# Patient Record
Sex: Male | Born: 1950
Health system: Southern US, Community
[De-identification: ages and names within clinical notes are randomized; demographics above are authoritative.]

## PROBLEM LIST (undated history)

## (undated) DIAGNOSIS — N400 Enlarged prostate without lower urinary tract symptoms: Secondary | ICD-10-CM

## (undated) DIAGNOSIS — M1990 Unspecified osteoarthritis, unspecified site: Secondary | ICD-10-CM

## (undated) DIAGNOSIS — Z9889 Other specified postprocedural states: Secondary | ICD-10-CM

## (undated) DIAGNOSIS — C9000 Multiple myeloma not having achieved remission: Secondary | ICD-10-CM

## (undated) DIAGNOSIS — I10 Essential (primary) hypertension: Secondary | ICD-10-CM

## (undated) DIAGNOSIS — M279 Disease of jaws, unspecified: Secondary | ICD-10-CM

## (undated) DIAGNOSIS — I509 Heart failure, unspecified: Secondary | ICD-10-CM

## (undated) DIAGNOSIS — R251 Tremor, unspecified: Secondary | ICD-10-CM

## (undated) DIAGNOSIS — E785 Hyperlipidemia, unspecified: Secondary | ICD-10-CM

## (undated) DIAGNOSIS — A809 Acute poliomyelitis, unspecified: Secondary | ICD-10-CM

## (undated) DIAGNOSIS — IMO0002 Reserved for concepts with insufficient information to code with codable children: Secondary | ICD-10-CM

## (undated) DIAGNOSIS — D759 Disease of blood and blood-forming organs, unspecified: Secondary | ICD-10-CM

## (undated) DIAGNOSIS — G709 Myoneural disorder, unspecified: Secondary | ICD-10-CM

## (undated) DIAGNOSIS — K5790 Diverticulosis of intestine, part unspecified, without perforation or abscess without bleeding: Secondary | ICD-10-CM

## (undated) HISTORY — DX: Benign prostatic hyperplasia without lower urinary tract symptoms: N40.0

## (undated) HISTORY — DX: Other specified postprocedural states: Z98.890

## (undated) HISTORY — DX: Hemochromatosis, unspecified: E83.119

## (undated) HISTORY — DX: Reserved for concepts with insufficient information to code with codable children: IMO0002

## (undated) HISTORY — PX: FOOT SURGERY: SHX648

## (undated) HISTORY — DX: Diverticulosis of intestine, part unspecified, without perforation or abscess without bleeding: K57.90

## (undated) HISTORY — DX: Hyperlipidemia, unspecified: E78.5

## (undated) HISTORY — PX: HIATAL HERNIA REPAIR: SHX195

## (undated) HISTORY — DX: Essential (primary) hypertension: I10

## (undated) HISTORY — PX: EYE SURGERY: SHX253

## (undated) HISTORY — PX: OTHER SURGICAL HISTORY: SHX169

## (undated) HISTORY — DX: Acute poliomyelitis, unspecified: A80.9

---
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
## 2001-08-23 ENCOUNTER — Encounter: Admission: RE | Admit: 2001-08-23 | Discharge: 2001-08-23 | Payer: Self-pay | Admitting: Family Medicine

## 2001-08-23 ENCOUNTER — Encounter: Payer: Self-pay | Admitting: Family Medicine

## 2001-11-11 ENCOUNTER — Encounter (INDEPENDENT_AMBULATORY_CARE_PROVIDER_SITE_OTHER): Payer: Self-pay | Admitting: Specialist

## 2001-11-11 ENCOUNTER — Ambulatory Visit (HOSPITAL_COMMUNITY): Admission: RE | Admit: 2001-11-11 | Discharge: 2001-11-11 | Payer: Self-pay | Admitting: Gastroenterology

## 2002-01-04 ENCOUNTER — Encounter: Payer: Self-pay | Admitting: Surgery

## 2002-01-11 ENCOUNTER — Inpatient Hospital Stay (HOSPITAL_COMMUNITY): Admission: RE | Admit: 2002-01-11 | Discharge: 2002-01-14 | Payer: Self-pay | Admitting: Surgery

## 2002-01-11 ENCOUNTER — Encounter (INDEPENDENT_AMBULATORY_CARE_PROVIDER_SITE_OTHER): Payer: Self-pay | Admitting: Specialist

## 2002-01-12 ENCOUNTER — Encounter: Payer: Self-pay | Admitting: Surgery

## 2003-03-31 ENCOUNTER — Emergency Department (HOSPITAL_COMMUNITY): Admission: AD | Admit: 2003-03-31 | Discharge: 2003-03-31 | Payer: Self-pay | Admitting: Emergency Medicine

## 2004-12-01 ENCOUNTER — Ambulatory Visit (HOSPITAL_COMMUNITY): Admission: RE | Admit: 2004-12-01 | Discharge: 2004-12-01 | Payer: Self-pay | Admitting: Surgery

## 2004-12-01 ENCOUNTER — Ambulatory Visit (HOSPITAL_BASED_OUTPATIENT_CLINIC_OR_DEPARTMENT_OTHER): Admission: RE | Admit: 2004-12-01 | Discharge: 2004-12-01 | Payer: Self-pay | Admitting: Surgery

## 2004-12-01 ENCOUNTER — Encounter (INDEPENDENT_AMBULATORY_CARE_PROVIDER_SITE_OTHER): Payer: Self-pay | Admitting: *Deleted

## 2008-11-01 ENCOUNTER — Ambulatory Visit: Payer: Self-pay | Admitting: Hematology and Oncology

## 2008-11-28 LAB — CBC WITH DIFFERENTIAL/PLATELET
BASO%: 0.5 % (ref 0.0–2.0)
Basophils Absolute: 0 10*3/uL (ref 0.0–0.1)
EOS%: 2.6 % (ref 0.0–7.0)
Eosinophils Absolute: 0.2 10*3/uL (ref 0.0–0.5)
HCT: 45.8 % (ref 38.4–49.9)
HGB: 16.1 g/dL (ref 13.0–17.1)
LYMPH%: 25.8 % (ref 14.0–49.0)
MCH: 33.4 pg (ref 27.2–33.4)
MCHC: 35.1 g/dL (ref 32.0–36.0)
MCV: 95.2 fL (ref 79.3–98.0)
MONO#: 0.8 10*3/uL (ref 0.1–0.9)
MONO%: 11.1 % (ref 0.0–14.0)
NEUT#: 4.3 10*3/uL (ref 1.5–6.5)
NEUT%: 60 % (ref 39.0–75.0)
Platelets: 254 10*3/uL (ref 140–400)
RBC: 4.81 10*6/uL (ref 4.20–5.82)
RDW: 12.7 % (ref 11.0–14.6)
WBC: 7.2 10*3/uL (ref 4.0–10.3)
lymph#: 1.9 10*3/uL (ref 0.9–3.3)

## 2008-11-30 ENCOUNTER — Ambulatory Visit (HOSPITAL_COMMUNITY): Admission: RE | Admit: 2008-11-30 | Discharge: 2008-11-30 | Payer: Self-pay | Admitting: Hematology and Oncology

## 2008-12-04 LAB — COMPREHENSIVE METABOLIC PANEL
ALT: 35 U/L (ref 0–53)
AST: 25 U/L (ref 0–37)
Albumin: 4.4 g/dL (ref 3.5–5.2)
Alkaline Phosphatase: 66 U/L (ref 39–117)
BUN: 18 mg/dL (ref 6–23)
CO2: 22 mEq/L (ref 19–32)
Calcium: 9.1 mg/dL (ref 8.4–10.5)
Chloride: 106 mEq/L (ref 96–112)
Creatinine, Ser: 0.77 mg/dL (ref 0.40–1.50)
Glucose, Bld: 95 mg/dL (ref 70–99)
Potassium: 4.5 mEq/L (ref 3.5–5.3)
Sodium: 140 mEq/L (ref 135–145)
Total Bilirubin: 1.1 mg/dL (ref 0.3–1.2)
Total Protein: 7.3 g/dL (ref 6.0–8.3)

## 2008-12-04 LAB — HEMOCHROMATOSIS DNA-PCR(C282Y,H63D)

## 2008-12-04 LAB — IRON AND TIBC
%SAT: 81 % — ABNORMAL HIGH (ref 20–55)
Iron: 228 ug/dL — ABNORMAL HIGH (ref 42–165)
TIBC: 283 ug/dL (ref 215–435)
UIBC: 55 ug/dL

## 2008-12-04 LAB — FERRITIN: Ferritin: 459 ng/mL — ABNORMAL HIGH (ref 22–322)

## 2008-12-26 ENCOUNTER — Ambulatory Visit: Payer: Self-pay | Admitting: Hematology and Oncology

## 2009-01-03 LAB — CBC WITH DIFFERENTIAL/PLATELET
BASO%: 0.9 % (ref 0.0–2.0)
Basophils Absolute: 0 10*3/uL (ref 0.0–0.1)
EOS%: 3.5 % (ref 0.0–7.0)
Eosinophils Absolute: 0.2 10*3/uL (ref 0.0–0.5)
HCT: 40.2 % (ref 38.4–49.9)
HGB: 14.1 g/dL (ref 13.0–17.1)
LYMPH%: 32.8 % (ref 14.0–49.0)
MCH: 33.1 pg (ref 27.2–33.4)
MCHC: 35 g/dL (ref 32.0–36.0)
MCV: 94.5 fL (ref 79.3–98.0)
MONO#: 0.5 10*3/uL (ref 0.1–0.9)
MONO%: 10.5 % (ref 0.0–14.0)
NEUT#: 2.2 10*3/uL (ref 1.5–6.5)
NEUT%: 52.3 % (ref 39.0–75.0)
Platelets: 219 10*3/uL (ref 140–400)
RBC: 4.25 10*6/uL (ref 4.20–5.82)
RDW: 12.6 % (ref 11.0–14.6)
WBC: 4.3 10*3/uL (ref 4.0–10.3)
lymph#: 1.4 10*3/uL (ref 0.9–3.3)

## 2009-01-11 LAB — CBC WITH DIFFERENTIAL/PLATELET
BASO%: 1.1 % (ref 0.0–2.0)
Basophils Absolute: 0.1 10*3/uL (ref 0.0–0.1)
EOS%: 3.4 % (ref 0.0–7.0)
Eosinophils Absolute: 0.2 10*3/uL (ref 0.0–0.5)
HCT: 41.1 % (ref 38.4–49.9)
HGB: 14.2 g/dL (ref 13.0–17.1)
LYMPH%: 36 % (ref 14.0–49.0)
MCH: 32.9 pg (ref 27.2–33.4)
MCHC: 34.5 g/dL (ref 32.0–36.0)
MCV: 95.4 fL (ref 79.3–98.0)
MONO#: 0.5 10*3/uL (ref 0.1–0.9)
MONO%: 10.9 % (ref 0.0–14.0)
NEUT#: 2.1 10*3/uL (ref 1.5–6.5)
NEUT%: 48.6 % (ref 39.0–75.0)
Platelets: 223 10*3/uL (ref 140–400)
RBC: 4.31 10*6/uL (ref 4.20–5.82)
RDW: 13.5 % (ref 11.0–14.6)
WBC: 4.4 10*3/uL (ref 4.0–10.3)
lymph#: 1.6 10*3/uL (ref 0.9–3.3)

## 2009-01-18 LAB — CBC WITH DIFFERENTIAL/PLATELET
BASO%: 0.6 % (ref 0.0–2.0)
Basophils Absolute: 0 10*3/uL (ref 0.0–0.1)
EOS%: 3.9 % (ref 0.0–7.0)
Eosinophils Absolute: 0.1 10*3/uL (ref 0.0–0.5)
HCT: 37.2 % — ABNORMAL LOW (ref 38.4–49.9)
HGB: 12.9 g/dL — ABNORMAL LOW (ref 13.0–17.1)
LYMPH%: 37.7 % (ref 14.0–49.0)
MCH: 33.8 pg — ABNORMAL HIGH (ref 27.2–33.4)
MCHC: 34.7 g/dL (ref 32.0–36.0)
MCV: 97.3 fL (ref 79.3–98.0)
MONO#: 0.3 10*3/uL (ref 0.1–0.9)
MONO%: 9.2 % (ref 0.0–14.0)
NEUT#: 1.6 10*3/uL (ref 1.5–6.5)
NEUT%: 48.6 % (ref 39.0–75.0)
Platelets: 189 10*3/uL (ref 140–400)
RBC: 3.82 10*6/uL — ABNORMAL LOW (ref 4.20–5.82)
RDW: 13.7 % (ref 11.0–14.6)
WBC: 3.4 10*3/uL — ABNORMAL LOW (ref 4.0–10.3)
lymph#: 1.3 10*3/uL (ref 0.9–3.3)

## 2009-01-18 LAB — FERRITIN: Ferritin: 116 ng/mL (ref 22–322)

## 2009-02-01 LAB — CBC WITH DIFFERENTIAL/PLATELET
BASO%: 0.7 % (ref 0.0–2.0)
Basophils Absolute: 0 10*3/uL (ref 0.0–0.1)
EOS%: 3.7 % (ref 0.0–7.0)
Eosinophils Absolute: 0.1 10*3/uL (ref 0.0–0.5)
HCT: 40.7 % (ref 38.4–49.9)
HGB: 14.1 g/dL (ref 13.0–17.1)
LYMPH%: 33.5 % (ref 14.0–49.0)
MCH: 33.9 pg — ABNORMAL HIGH (ref 27.2–33.4)
MCHC: 34.5 g/dL (ref 32.0–36.0)
MCV: 98.1 fL — ABNORMAL HIGH (ref 79.3–98.0)
MONO#: 0.4 10*3/uL (ref 0.1–0.9)
MONO%: 10.5 % (ref 0.0–14.0)
NEUT#: 2 10*3/uL (ref 1.5–6.5)
NEUT%: 51.6 % (ref 39.0–75.0)
Platelets: 209 10*3/uL (ref 140–400)
RBC: 4.15 10*6/uL — ABNORMAL LOW (ref 4.20–5.82)
RDW: 13.8 % (ref 11.0–14.6)
WBC: 3.9 10*3/uL — ABNORMAL LOW (ref 4.0–10.3)
lymph#: 1.3 10*3/uL (ref 0.9–3.3)

## 2009-02-13 ENCOUNTER — Ambulatory Visit: Payer: Self-pay | Admitting: Hematology and Oncology

## 2009-02-15 LAB — CBC WITH DIFFERENTIAL/PLATELET
BASO%: 0.6 % (ref 0.0–2.0)
Basophils Absolute: 0 10*3/uL (ref 0.0–0.1)
EOS%: 3.8 % (ref 0.0–7.0)
Eosinophils Absolute: 0.1 10*3/uL (ref 0.0–0.5)
HCT: 41.8 % (ref 38.4–49.9)
HGB: 14.5 g/dL (ref 13.0–17.1)
LYMPH%: 33 % (ref 14.0–49.0)
MCH: 33.9 pg — ABNORMAL HIGH (ref 27.2–33.4)
MCHC: 34.8 g/dL (ref 32.0–36.0)
MCV: 97.4 fL (ref 79.3–98.0)
MONO#: 0.3 10*3/uL (ref 0.1–0.9)
MONO%: 8.7 % (ref 0.0–14.0)
NEUT#: 2 10*3/uL (ref 1.5–6.5)
NEUT%: 53.9 % (ref 39.0–75.0)
Platelets: 236 10*3/uL (ref 140–400)
RBC: 4.29 10*6/uL (ref 4.20–5.82)
RDW: 13.1 % (ref 11.0–14.6)
WBC: 3.8 10*3/uL — ABNORMAL LOW (ref 4.0–10.3)
lymph#: 1.2 10*3/uL (ref 0.9–3.3)

## 2009-03-05 LAB — COMPREHENSIVE METABOLIC PANEL
ALT: 38 U/L (ref 0–53)
AST: 35 U/L (ref 0–37)
Albumin: 3.8 g/dL (ref 3.5–5.2)
Alkaline Phosphatase: 70 U/L (ref 39–117)
BUN: 14 mg/dL (ref 6–23)
CO2: 25 mEq/L (ref 19–32)
Calcium: 8.8 mg/dL (ref 8.4–10.5)
Chloride: 107 mEq/L (ref 96–112)
Creatinine, Ser: 0.84 mg/dL (ref 0.40–1.50)
Glucose, Bld: 84 mg/dL (ref 70–99)
Potassium: 3.8 mEq/L (ref 3.5–5.3)
Sodium: 139 mEq/L (ref 135–145)
Total Bilirubin: 0.9 mg/dL (ref 0.3–1.2)
Total Protein: 6.9 g/dL (ref 6.0–8.3)

## 2009-03-05 LAB — CBC WITH DIFFERENTIAL/PLATELET
BASO%: 0.7 % (ref 0.0–2.0)
Basophils Absolute: 0 10*3/uL (ref 0.0–0.1)
EOS%: 3.7 % (ref 0.0–7.0)
Eosinophils Absolute: 0.2 10*3/uL (ref 0.0–0.5)
HCT: 40.9 % (ref 38.4–49.9)
HGB: 14.2 g/dL (ref 13.0–17.1)
LYMPH%: 27.8 % (ref 14.0–49.0)
MCH: 33.8 pg — ABNORMAL HIGH (ref 27.2–33.4)
MCHC: 34.7 g/dL (ref 32.0–36.0)
MCV: 97.6 fL (ref 79.3–98.0)
MONO#: 0.7 10*3/uL (ref 0.1–0.9)
MONO%: 12.4 % (ref 0.0–14.0)
NEUT#: 3.1 10*3/uL (ref 1.5–6.5)
NEUT%: 55.4 % (ref 39.0–75.0)
Platelets: 195 10*3/uL (ref 140–400)
RBC: 4.19 10*6/uL — ABNORMAL LOW (ref 4.20–5.82)
RDW: 12.8 % (ref 11.0–14.6)
WBC: 5.6 10*3/uL (ref 4.0–10.3)
lymph#: 1.6 10*3/uL (ref 0.9–3.3)

## 2009-03-05 LAB — FERRITIN: Ferritin: 41 ng/mL (ref 22–322)

## 2009-03-15 LAB — HM COLONOSCOPY

## 2009-05-01 ENCOUNTER — Ambulatory Visit: Payer: Self-pay | Admitting: Hematology and Oncology

## 2009-05-06 LAB — BASIC METABOLIC PANEL WITH GFR
BUN: 14 mg/dL (ref 6–23)
CO2: 27 meq/L (ref 19–32)
Calcium: 8.8 mg/dL (ref 8.4–10.5)
Chloride: 108 meq/L (ref 96–112)
Creatinine, Ser: 0.79 mg/dL (ref 0.40–1.50)
Glucose, Bld: 86 mg/dL (ref 70–99)
Potassium: 3.8 meq/L (ref 3.5–5.3)
Sodium: 139 meq/L (ref 135–145)

## 2009-05-06 LAB — CBC WITH DIFFERENTIAL/PLATELET
BASO%: 0.7 % (ref 0.0–2.0)
Basophils Absolute: 0 10e3/uL (ref 0.0–0.1)
EOS%: 2.5 % (ref 0.0–7.0)
Eosinophils Absolute: 0.1 10e3/uL (ref 0.0–0.5)
HCT: 41.9 % (ref 38.4–49.9)
HGB: 14.4 g/dL (ref 13.0–17.1)
LYMPH%: 35.7 % (ref 14.0–49.0)
MCH: 32.3 pg (ref 27.2–33.4)
MCHC: 34.3 g/dL (ref 32.0–36.0)
MCV: 94.4 fL (ref 79.3–98.0)
MONO#: 0.9 10e3/uL (ref 0.1–0.9)
MONO%: 16.3 % — ABNORMAL HIGH (ref 0.0–14.0)
NEUT#: 2.5 10e3/uL (ref 1.5–6.5)
NEUT%: 44.8 % (ref 39.0–75.0)
Platelets: 199 10e3/uL (ref 140–400)
RBC: 4.44 10e6/uL (ref 4.20–5.82)
RDW: 12.9 % (ref 11.0–14.6)
WBC: 5.5 10e3/uL (ref 4.0–10.3)
lymph#: 2 10e3/uL (ref 0.9–3.3)

## 2009-05-06 LAB — FERRITIN: Ferritin: 58 ng/mL (ref 22–322)

## 2009-05-17 LAB — CBC WITH DIFFERENTIAL/PLATELET
BASO%: 0.6 % (ref 0.0–2.0)
Basophils Absolute: 0 10*3/uL (ref 0.0–0.1)
EOS%: 2.8 % (ref 0.0–7.0)
Eosinophils Absolute: 0.1 10*3/uL (ref 0.0–0.5)
HCT: 43.7 % (ref 38.4–49.9)
HGB: 15 g/dL (ref 13.0–17.1)
LYMPH%: 30.1 % (ref 14.0–49.0)
MCH: 32.3 pg (ref 27.2–33.4)
MCHC: 34.4 g/dL (ref 32.0–36.0)
MCV: 93.8 fL (ref 79.3–98.0)
MONO#: 0.6 10*3/uL (ref 0.1–0.9)
MONO%: 13.3 % (ref 0.0–14.0)
NEUT#: 2.5 10*3/uL (ref 1.5–6.5)
NEUT%: 53.2 % (ref 39.0–75.0)
Platelets: 209 10*3/uL (ref 140–400)
RBC: 4.66 10*6/uL (ref 4.20–5.82)
RDW: 13.1 % (ref 11.0–14.6)
WBC: 4.7 10*3/uL (ref 4.0–10.3)
lymph#: 1.4 10*3/uL (ref 0.9–3.3)

## 2009-07-24 ENCOUNTER — Ambulatory Visit: Payer: Self-pay | Admitting: Hematology and Oncology

## 2009-07-26 LAB — CBC WITH DIFFERENTIAL/PLATELET
BASO%: 0.7 % (ref 0.0–2.0)
Basophils Absolute: 0 10*3/uL (ref 0.0–0.1)
EOS%: 3.8 % (ref 0.0–7.0)
Eosinophils Absolute: 0.2 10*3/uL (ref 0.0–0.5)
HCT: 44.3 % (ref 38.4–49.9)
HGB: 15.1 g/dL (ref 13.0–17.1)
LYMPH%: 33.2 % (ref 14.0–49.0)
MCH: 32.8 pg (ref 27.2–33.4)
MCHC: 34.1 g/dL (ref 32.0–36.0)
MCV: 96.3 fL (ref 79.3–98.0)
MONO#: 0.6 10*3/uL (ref 0.1–0.9)
MONO%: 14.1 % — ABNORMAL HIGH (ref 0.0–14.0)
NEUT#: 2.2 10*3/uL (ref 1.5–6.5)
NEUT%: 48.2 % (ref 39.0–75.0)
Platelets: 204 10*3/uL (ref 140–400)
RBC: 4.6 10*6/uL (ref 4.20–5.82)
RDW: 12.8 % (ref 11.0–14.6)
WBC: 4.5 10*3/uL (ref 4.0–10.3)
lymph#: 1.5 10*3/uL (ref 0.9–3.3)

## 2009-07-26 LAB — COMPREHENSIVE METABOLIC PANEL
ALT: 21 U/L (ref 0–53)
AST: 21 U/L (ref 0–37)
Albumin: 4.1 g/dL (ref 3.5–5.2)
Alkaline Phosphatase: 58 U/L (ref 39–117)
BUN: 16 mg/dL (ref 6–23)
CO2: 25 mEq/L (ref 19–32)
Calcium: 8.7 mg/dL (ref 8.4–10.5)
Chloride: 104 mEq/L (ref 96–112)
Creatinine, Ser: 0.9 mg/dL (ref 0.40–1.50)
Glucose, Bld: 121 mg/dL — ABNORMAL HIGH (ref 70–99)
Potassium: 4.1 mEq/L (ref 3.5–5.3)
Sodium: 138 mEq/L (ref 135–145)
Total Bilirubin: 1 mg/dL (ref 0.3–1.2)
Total Protein: 6.5 g/dL (ref 6.0–8.3)

## 2009-07-26 LAB — FERRITIN: Ferritin: 41 ng/mL (ref 22–322)

## 2009-11-01 ENCOUNTER — Ambulatory Visit (HOSPITAL_BASED_OUTPATIENT_CLINIC_OR_DEPARTMENT_OTHER): Admission: RE | Admit: 2009-11-01 | Discharge: 2009-11-01 | Payer: Self-pay | Admitting: Orthopedic Surgery

## 2009-11-01 ENCOUNTER — Ambulatory Visit: Payer: Self-pay | Admitting: Hematology and Oncology

## 2009-11-05 LAB — CBC WITH DIFFERENTIAL/PLATELET
BASO%: 0.6 % (ref 0.0–2.0)
Basophils Absolute: 0 10*3/uL (ref 0.0–0.1)
EOS%: 4.1 % (ref 0.0–7.0)
Eosinophils Absolute: 0.3 10*3/uL (ref 0.0–0.5)
HCT: 46.3 % (ref 38.4–49.9)
HGB: 16 g/dL (ref 13.0–17.1)
LYMPH%: 28.8 % (ref 14.0–49.0)
MCH: 33.4 pg (ref 27.2–33.4)
MCHC: 34.5 g/dL (ref 32.0–36.0)
MCV: 97 fL (ref 79.3–98.0)
MONO#: 1.1 10*3/uL — ABNORMAL HIGH (ref 0.1–0.9)
MONO%: 13.6 % (ref 0.0–14.0)
NEUT#: 4.3 10*3/uL (ref 1.5–6.5)
NEUT%: 52.9 % (ref 39.0–75.0)
Platelets: 236 10*3/uL (ref 140–400)
RBC: 4.78 10*6/uL (ref 4.20–5.82)
RDW: 12.8 % (ref 11.0–14.6)
WBC: 8.2 10*3/uL (ref 4.0–10.3)
lymph#: 2.4 10*3/uL (ref 0.9–3.3)

## 2009-11-05 LAB — FERRITIN: Ferritin: 150 ng/mL (ref 22–322)

## 2009-11-05 LAB — COMPREHENSIVE METABOLIC PANEL
ALT: 43 U/L (ref 0–53)
AST: 33 U/L (ref 0–37)
Albumin: 4.1 g/dL (ref 3.5–5.2)
Alkaline Phosphatase: 63 U/L (ref 39–117)
BUN: 17 mg/dL (ref 6–23)
CO2: 25 mEq/L (ref 19–32)
Calcium: 8.5 mg/dL (ref 8.4–10.5)
Chloride: 105 mEq/L (ref 96–112)
Creatinine, Ser: 0.87 mg/dL (ref 0.40–1.50)
Glucose, Bld: 90 mg/dL (ref 70–99)
Potassium: 4.2 mEq/L (ref 3.5–5.3)
Sodium: 137 mEq/L (ref 135–145)
Total Bilirubin: 1.1 mg/dL (ref 0.3–1.2)
Total Protein: 7.1 g/dL (ref 6.0–8.3)

## 2009-11-22 LAB — CBC WITH DIFFERENTIAL/PLATELET
BASO%: 0.4 % (ref 0.0–2.0)
Basophils Absolute: 0 10*3/uL (ref 0.0–0.1)
EOS%: 3.6 % (ref 0.0–7.0)
Eosinophils Absolute: 0.2 10*3/uL (ref 0.0–0.5)
HCT: 43.3 % (ref 38.4–49.9)
HGB: 15.1 g/dL (ref 13.0–17.1)
LYMPH%: 32.3 % (ref 14.0–49.0)
MCH: 34.1 pg — ABNORMAL HIGH (ref 27.2–33.4)
MCHC: 34.8 g/dL (ref 32.0–36.0)
MCV: 98 fL (ref 79.3–98.0)
MONO#: 0.5 10*3/uL (ref 0.1–0.9)
MONO%: 10 % (ref 0.0–14.0)
NEUT#: 2.6 10*3/uL (ref 1.5–6.5)
NEUT%: 53.7 % (ref 39.0–75.0)
Platelets: 206 10*3/uL (ref 140–400)
RBC: 4.42 10*6/uL (ref 4.20–5.82)
RDW: 13.6 % (ref 11.0–14.6)
WBC: 4.8 10*3/uL (ref 4.0–10.3)
lymph#: 1.5 10*3/uL (ref 0.9–3.3)

## 2009-12-03 ENCOUNTER — Ambulatory Visit: Payer: Self-pay | Admitting: Hematology and Oncology

## 2009-12-05 LAB — CBC WITH DIFFERENTIAL/PLATELET
BASO%: 1 % (ref 0.0–2.0)
Basophils Absolute: 0 10*3/uL (ref 0.0–0.1)
EOS%: 3.4 % (ref 0.0–7.0)
Eosinophils Absolute: 0.2 10*3/uL (ref 0.0–0.5)
HCT: 42.9 % (ref 38.4–49.9)
HGB: 14.9 g/dL (ref 13.0–17.1)
LYMPH%: 34.3 % (ref 14.0–49.0)
MCH: 34.2 pg — ABNORMAL HIGH (ref 27.2–33.4)
MCHC: 34.7 g/dL (ref 32.0–36.0)
MCV: 98.4 fL — ABNORMAL HIGH (ref 79.3–98.0)
MONO#: 0.5 10*3/uL (ref 0.1–0.9)
MONO%: 10.3 % (ref 0.0–14.0)
NEUT#: 2.5 10*3/uL (ref 1.5–6.5)
NEUT%: 51 % (ref 39.0–75.0)
Platelets: 232 10*3/uL (ref 140–400)
RBC: 4.36 10*6/uL (ref 4.20–5.82)
RDW: 13.3 % (ref 11.0–14.6)
WBC: 4.9 10*3/uL (ref 4.0–10.3)
lymph#: 1.7 10*3/uL (ref 0.9–3.3)

## 2009-12-16 LAB — CBC WITH DIFFERENTIAL/PLATELET
BASO%: 0.6 % (ref 0.0–2.0)
Basophils Absolute: 0 10*3/uL (ref 0.0–0.1)
EOS%: 1.7 % (ref 0.0–7.0)
Eosinophils Absolute: 0.1 10*3/uL (ref 0.0–0.5)
HCT: 40.8 % (ref 38.4–49.9)
HGB: 14.1 g/dL (ref 13.0–17.1)
LYMPH%: 27.1 % (ref 14.0–49.0)
MCH: 33.9 pg — ABNORMAL HIGH (ref 27.2–33.4)
MCHC: 34.5 g/dL (ref 32.0–36.0)
MCV: 98.1 fL — ABNORMAL HIGH (ref 79.3–98.0)
MONO#: 0.9 10*3/uL (ref 0.1–0.9)
MONO%: 14.1 % — ABNORMAL HIGH (ref 0.0–14.0)
NEUT#: 3.7 10*3/uL (ref 1.5–6.5)
NEUT%: 56.5 % (ref 39.0–75.0)
Platelets: 233 10*3/uL (ref 140–400)
RBC: 4.16 10*6/uL — ABNORMAL LOW (ref 4.20–5.82)
RDW: 13.1 % (ref 11.0–14.6)
WBC: 6.5 10*3/uL (ref 4.0–10.3)
lymph#: 1.8 10*3/uL (ref 0.9–3.3)

## 2009-12-16 LAB — IRON AND TIBC
%SAT: 37 % (ref 20–55)
Iron: 112 ug/dL (ref 42–165)
TIBC: 299 ug/dL (ref 215–435)
UIBC: 187 ug/dL

## 2009-12-16 LAB — FERRITIN: Ferritin: 25 ng/mL (ref 22–322)

## 2009-12-16 LAB — BASIC METABOLIC PANEL
BUN: 22 mg/dL (ref 6–23)
CO2: 25 mEq/L (ref 19–32)
Calcium: 8.9 mg/dL (ref 8.4–10.5)
Chloride: 108 mEq/L (ref 96–112)
Creatinine, Ser: 0.89 mg/dL (ref 0.40–1.50)
Glucose, Bld: 86 mg/dL (ref 70–99)
Potassium: 3.9 mEq/L (ref 3.5–5.3)
Sodium: 140 mEq/L (ref 135–145)

## 2010-01-17 ENCOUNTER — Ambulatory Visit: Payer: Self-pay | Admitting: Hematology and Oncology

## 2010-01-22 LAB — FERRITIN: Ferritin: 36 ng/mL (ref 22–322)

## 2010-02-17 ENCOUNTER — Ambulatory Visit: Payer: Self-pay | Admitting: Hematology and Oncology

## 2010-02-17 LAB — FERRITIN: Ferritin: 34 ng/mL (ref 22–322)

## 2010-03-21 ENCOUNTER — Ambulatory Visit: Payer: Self-pay | Admitting: Hematology and Oncology

## 2010-03-25 LAB — BASIC METABOLIC PANEL
BUN: 20 mg/dL (ref 6–23)
CO2: 24 mEq/L (ref 19–32)
Calcium: 8.7 mg/dL (ref 8.4–10.5)
Chloride: 107 mEq/L (ref 96–112)
Creatinine, Ser: 0.83 mg/dL (ref 0.40–1.50)
Glucose, Bld: 104 mg/dL — ABNORMAL HIGH (ref 70–99)
Potassium: 4.2 mEq/L (ref 3.5–5.3)
Sodium: 138 mEq/L (ref 135–145)

## 2010-03-25 LAB — FERRITIN: Ferritin: 52 ng/mL (ref 22–322)

## 2010-03-25 LAB — CBC WITH DIFFERENTIAL/PLATELET
BASO%: 0.6 % (ref 0.0–2.0)
Basophils Absolute: 0 10*3/uL (ref 0.0–0.1)
EOS%: 3 % (ref 0.0–7.0)
Eosinophils Absolute: 0.2 10*3/uL (ref 0.0–0.5)
HCT: 42.8 % (ref 38.4–49.9)
HGB: 14.9 g/dL (ref 13.0–17.1)
LYMPH%: 26.8 % (ref 14.0–49.0)
MCH: 33.1 pg (ref 27.2–33.4)
MCHC: 34.8 g/dL (ref 32.0–36.0)
MCV: 95.1 fL (ref 79.3–98.0)
MONO#: 0.7 10*3/uL (ref 0.1–0.9)
MONO%: 10.1 % (ref 0.0–14.0)
NEUT#: 3.9 10*3/uL (ref 1.5–6.5)
NEUT%: 59.5 % (ref 39.0–75.0)
Platelets: 222 10*3/uL (ref 140–400)
RBC: 4.5 10*6/uL (ref 4.20–5.82)
RDW: 13 % (ref 11.0–14.6)
WBC: 6.5 10*3/uL (ref 4.0–10.3)
lymph#: 1.7 10*3/uL (ref 0.9–3.3)

## 2010-03-25 LAB — IRON AND TIBC
%SAT: 60 % — ABNORMAL HIGH (ref 20–55)
Iron: 176 ug/dL — ABNORMAL HIGH (ref 42–165)
TIBC: 293 ug/dL (ref 215–435)
UIBC: 117 ug/dL

## 2010-05-29 ENCOUNTER — Ambulatory Visit: Payer: Self-pay | Admitting: Family Medicine

## 2010-06-25 ENCOUNTER — Ambulatory Visit: Payer: Self-pay | Admitting: Hematology and Oncology

## 2010-06-27 ENCOUNTER — Ambulatory Visit (HOSPITAL_COMMUNITY): Admission: RE | Admit: 2010-06-27 | Discharge: 2010-06-27 | Payer: Self-pay | Admitting: Hematology and Oncology

## 2010-06-27 LAB — CBC WITH DIFFERENTIAL/PLATELET
BASO%: 0.9 % (ref 0.0–2.0)
Basophils Absolute: 0 10*3/uL (ref 0.0–0.1)
EOS%: 4.6 % (ref 0.0–7.0)
Eosinophils Absolute: 0.2 10*3/uL (ref 0.0–0.5)
HCT: 42.8 % (ref 38.4–49.9)
HGB: 14.9 g/dL (ref 13.0–17.1)
LYMPH%: 33.9 % (ref 14.0–49.0)
MCH: 33.4 pg (ref 27.2–33.4)
MCHC: 34.8 g/dL (ref 32.0–36.0)
MCV: 95.8 fL (ref 79.3–98.0)
MONO#: 0.5 10*3/uL (ref 0.1–0.9)
MONO%: 11.5 % (ref 0.0–14.0)
NEUT#: 2.1 10*3/uL (ref 1.5–6.5)
NEUT%: 49.1 % (ref 39.0–75.0)
Platelets: 204 10*3/uL (ref 140–400)
RBC: 4.48 10*6/uL (ref 4.20–5.82)
RDW: 12.3 % (ref 11.0–14.6)
WBC: 4.4 10*3/uL (ref 4.0–10.3)
lymph#: 1.5 10*3/uL (ref 0.9–3.3)

## 2010-06-27 LAB — COMPREHENSIVE METABOLIC PANEL
ALT: 26 U/L (ref 0–53)
AST: 22 U/L (ref 0–37)
Albumin: 4 g/dL (ref 3.5–5.2)
Alkaline Phosphatase: 58 U/L (ref 39–117)
BUN: 20 mg/dL (ref 6–23)
CO2: 23 mEq/L (ref 19–32)
Calcium: 8.7 mg/dL (ref 8.4–10.5)
Chloride: 107 mEq/L (ref 96–112)
Creatinine, Ser: 0.85 mg/dL (ref 0.40–1.50)
Glucose, Bld: 155 mg/dL — ABNORMAL HIGH (ref 70–99)
Potassium: 4.7 mEq/L (ref 3.5–5.3)
Sodium: 139 mEq/L (ref 135–145)
Total Bilirubin: 1.1 mg/dL (ref 0.3–1.2)
Total Protein: 6.3 g/dL (ref 6.0–8.3)

## 2010-06-27 LAB — LACTATE DEHYDROGENASE: LDH: 187 U/L (ref 94–250)

## 2010-06-27 LAB — AFP TUMOR MARKER: AFP-Tumor Marker: 2.5 ng/mL (ref 0.0–8.0)

## 2010-06-27 LAB — FERRITIN: Ferritin: 39 ng/mL (ref 22–322)

## 2010-09-08 ENCOUNTER — Ambulatory Visit (HOSPITAL_BASED_OUTPATIENT_CLINIC_OR_DEPARTMENT_OTHER): Payer: Self-pay | Admitting: Hematology and Oncology

## 2010-09-09 LAB — CBC WITH DIFFERENTIAL/PLATELET
BASO%: 0.9 % (ref 0.0–2.0)
Basophils Absolute: 0.1 10*3/uL (ref 0.0–0.1)
EOS%: 4 % (ref 0.0–7.0)
Eosinophils Absolute: 0.2 10*3/uL (ref 0.0–0.5)
HCT: 40.8 % (ref 38.4–49.9)
HGB: 14.4 g/dL (ref 13.0–17.1)
LYMPH%: 30.9 % (ref 14.0–49.0)
MCH: 33.7 pg — ABNORMAL HIGH (ref 27.2–33.4)
MCHC: 35.2 g/dL (ref 32.0–36.0)
MCV: 95.6 fL (ref 79.3–98.0)
MONO#: 0.7 10*3/uL (ref 0.1–0.9)
MONO%: 12.1 % (ref 0.0–14.0)
NEUT#: 3 10*3/uL (ref 1.5–6.5)
NEUT%: 52.1 % (ref 39.0–75.0)
Platelets: 200 10*3/uL (ref 140–400)
RBC: 4.26 10*6/uL (ref 4.20–5.82)
RDW: 12.8 % (ref 11.0–14.6)
WBC: 5.8 10*3/uL (ref 4.0–10.3)
lymph#: 1.8 10*3/uL (ref 0.9–3.3)

## 2010-09-09 LAB — LACTATE DEHYDROGENASE: LDH: 201 U/L (ref 94–250)

## 2010-09-09 LAB — FERRITIN: Ferritin: 35 ng/mL (ref 22–322)

## 2010-09-10 ENCOUNTER — Ambulatory Visit: Payer: Self-pay | Admitting: Cardiology

## 2010-12-08 ENCOUNTER — Encounter: Payer: Self-pay | Admitting: Hematology and Oncology

## 2010-12-08 LAB — CBC WITH DIFFERENTIAL/PLATELET
BASO%: 0.1 % (ref 0.0–2.0)
Basophils Absolute: 0 10*3/uL (ref 0.0–0.1)
EOS%: 2.9 % (ref 0.0–7.0)
Eosinophils Absolute: 0.2 10*3/uL (ref 0.0–0.5)
HCT: 41.5 % (ref 38.4–49.9)
HGB: 14.1 g/dL (ref 13.0–17.1)
LYMPH%: 28.3 % (ref 14.0–49.0)
MCH: 32.8 pg (ref 27.2–33.4)
MCHC: 34.1 g/dL (ref 32.0–36.0)
MCV: 96.1 fL (ref 79.3–98.0)
MONO#: 0.7 10*3/uL (ref 0.1–0.9)
MONO%: 11.5 % (ref 0.0–14.0)
NEUT#: 3.5 10*3/uL (ref 1.5–6.5)
NEUT%: 57.2 % (ref 39.0–75.0)
Platelets: 213 10*3/uL (ref 140–400)
RBC: 4.32 10*6/uL (ref 4.20–5.82)
RDW: 13 % (ref 11.0–14.6)
WBC: 6.2 10*3/uL (ref 4.0–10.3)
lymph#: 1.7 10*3/uL (ref 0.9–3.3)

## 2010-12-08 LAB — FERRITIN: Ferritin: 76 ng/mL (ref 22–322)

## 2010-12-08 LAB — COMPREHENSIVE METABOLIC PANEL
ALT: 25 U/L (ref 0–53)
AST: 26 U/L (ref 0–37)
Albumin: 3.8 g/dL (ref 3.5–5.2)
Alkaline Phosphatase: 58 U/L (ref 39–117)
BUN: 21 mg/dL (ref 6–23)
CO2: 25 mEq/L (ref 19–32)
Calcium: 8.6 mg/dL (ref 8.4–10.5)
Chloride: 105 mEq/L (ref 96–112)
Creatinine, Ser: 1.27 mg/dL (ref 0.40–1.50)
Glucose, Bld: 88 mg/dL (ref 70–99)
Potassium: 4.2 mEq/L (ref 3.5–5.3)
Sodium: 138 mEq/L (ref 135–145)
Total Bilirubin: 1.2 mg/dL (ref 0.3–1.2)
Total Protein: 6.7 g/dL (ref 6.0–8.3)

## 2010-12-12 ENCOUNTER — Encounter (HOSPITAL_BASED_OUTPATIENT_CLINIC_OR_DEPARTMENT_OTHER): Payer: BC Managed Care – PPO | Admitting: Hematology and Oncology

## 2010-12-22 LAB — POCT HEMOGLOBIN-HEMACUE: Hemoglobin: 16.3 g/dL (ref 13.0–17.0)

## 2011-02-20 NOTE — Procedures (Signed)
[facility]  Patient:    Jesse Mcclure, Jesse Mcclure Visit Number: [id number] MRN: [id number]          Service Type: END Location: ENDO Attending Physician:  Rich Brave Dictated by:   Florencia Reasons, M.D. Proc. Date: 11/11/01 Admit Date:  11/11/2001   CC:         Chales Salmon. Abigail Miyamoto, M.D.  Thornton Park Daphine Deutscher, M.D.   Procedure Report  PROCEDURE:  Upper endoscopy with biopsies.  INDICATION:  A 60 year old gentleman with large hiatal hernia, heme-positive stool, and progressively severe acid reflux symptoms of about two years duration with both regurgitation and heartburn, especially with recumbency.  FINDINGS:  Distal esophagitis.  Short segment Barretts esophagus.  Large 6 cm hiatal hernia  DESCRIPTION OF PROCEDURE:  The nature, purpose, and risks of the procedure had been discussed with the patient, who provided written consent.  Sedation was fentanyl 75 mcg and Versed 7 mg IV without arrhythmias or desaturation.  The Olympus adult video endoscope was passed under direct vision.  The esophagus was readily entered.  The vocal cords were not well seen during this procedure.  The proximal esophagus was normal, but it was noted that bile was refluxing up into the esophagus during the course of the exam.  The squamocolumnar junction was located at about 34 cm from the mouth, and there was a 2-3 tongue of Barretts mucosa arising superiorly and proximally above the main part of the squamocolumnar junction.  The diaphragmatic hiatus was located about 40 cm, constituting a roughly 6 cm hiatal hernia.  The transverse dimension of the hiatal hernia was quite large, probably about 15 cm, and it is felt that probably about one-quarter to one-third of the patients stomach was up in the chest.  The stomach itself contained no significant residual and had normal mucosa without evidence of gastritis, erosions, ulcers, polyps, or masses, and the pylorus,  duodenal bulb, and second duodenum were unremarkable in appearance. Retroflexed viewing showed a very patulous diaphragmatic hiatus, estimated to be about 7-10 cm across.  In addition to the Barretts esophagus, there was distal esophagitis characterized by some mucosal irregularity and punctate erythema, although no frank erosions or ulcerations were seen.  I saw no evidence of varices, infection, or neoplasia.  Biopsies were obtained from the Barretts segment prior to removal of the scope.  The patient tolerated the procedure well, and there were no apparent complications.  IMPRESSION: 1. Large hiatal hernia, as previously known. 2. Mild distal esophagitis. 3. Short segment Barretts esophagus. 4. Patulous diaphragmatic hiatus.  PLAN: 1. Await pathology on biopsies. 2. Await surgical evaluation for possible hiatal hernia repair. Dictated by:   Florencia Reasons, M.D. Attending Physician:  Rich Brave DD:  11/11/01 TD:  11/12/01 Job: [id number]

## 2011-02-20 NOTE — Op Note (Signed)
NAME:  Jesse Mcclure, Jesse Mcclure NO.:  [id number]   MEDICAL RECORD NO.:  [id number]          PATIENT TYPE:  AMB   LOCATION:  DSC                          FACILITY:  [facility]   PHYSICIAN:  Thornton Park. Daphine Deutscher, MD  DATE OF BIRTH:  12-Aug-1951   DATE OF PROCEDURE:  12/01/2004  DATE OF DISCHARGE:                                 OPERATIVE REPORT   PREOPERATIVE DIAGNOSIS:  Palpable mass left breast.   POSTOPERATIVE DIAGNOSIS:  Probable lipoma left breast.   SURGEON:  Thornton Park. Daphine Deutscher, M.D.   ANESTHESIA:  Local MAC.   PROCEDURE:  Left breast biopsy.   ESTIMATED BLOOD LOSS:  Minimal.   DESCRIPTION OF PROCEDURE:  [name] had this area localized and marked in  sort of his upper inner quadrant of his left breast.  The area was prepped  with Betadine and draped sterilely and was infiltrated with 1% lidocaine.  I  carried my incision down through the dermis and into the subdermal fat and  then encountered what appeared to be an encapsulated lipoma.  It appeared to  be emanating from within the fibers of the pectoralis muscle, as I had to  get it off between fibers of that muscle, and it was somewhat deep and  multilobulated.  This however came out in toto, and I removed it and  controlled bleeding with electrocautery.  The wound was irrigated with  saline and then closed with 4-0 Vicryl subcutaneously and then 5-0 Vicryl  subcuticularly, and then with Benzoin and Steri-Strips.  The patient  tolerated the procedure well.  He will be given Vicodin to take as needed  for pain.  He will be followed up in the office in approximately three  weeks.      MBM/MEDQ  D:  12/01/2004  T:  12/01/2004  Job:  [id number]   cc:   Chales Salmon. Abigail Miyamoto, M.D.  [address]  [place]  [place] [address]  Fax: [phone]

## 2011-02-20 NOTE — Discharge Summary (Signed)
[place]  Patient:    [name], [name] Visit Number: [id number] MRN: [id number]          Service Type: SUR Location: 3W 4782 02 Attending Physician:  Katha Cabal Dictated by:   Thornton Park Daphine Deutscher, M.D. Admit Date:  01/11/2002   CC:         Chales Salmon. Abigail Miyamoto, M.D.  Florencia Reasons, M.D.   Discharge Summary  CCI NUMBER:  [phone]  ADMISSION DIAGNOSIS:  Refractory gastroesophageal reflux disease with large hiatal hernia.  PROCEDURES:  Laparoscopic Nissen fundoplication and repair of large hiatal hernia on January 11, 2002.  COURSE IN HOSPITAL:  Jesse Mcclure is a 60 year old gentleman who had been plagued with refractory reflux for several years.  He was an a.m. admission on April 9 at which time he underwent a laparoscopic Nissen fundoplication repair of hiatal hernia.  This proved to be a very stuck and inflamed distal esophagus with apparent esophageal hiatal hernia component and a fairly significant diaphragmatic defect.  These were repaired laparoscopically, and Nissen fundoplication was performed.  On April 10, he went down for an upper GI series which showed his wrap to be intact and no evidence of extravasation.  He was begun on liquids on April 11 and had some dysphagia which seemed to resolve by April 12.  He is ready for discharge at this time.  He will be given Mepergan Fortis pills to take if needed for pain and will be followed up in the office in two to three weeks. Dictated by:   Thornton Park Daphine Deutscher, M.D. Attending Physician:  Katha Cabal DD:  01/14/02 TD:  01/15/02 Job: [id number]

## 2011-03-09 ENCOUNTER — Other Ambulatory Visit: Payer: Self-pay | Admitting: Hematology and Oncology

## 2011-03-09 ENCOUNTER — Encounter (HOSPITAL_BASED_OUTPATIENT_CLINIC_OR_DEPARTMENT_OTHER): Payer: BC Managed Care – PPO | Admitting: Hematology and Oncology

## 2011-03-09 LAB — CBC WITH DIFFERENTIAL/PLATELET
BASO%: 0.5 % (ref 0.0–2.0)
Basophils Absolute: 0 10*3/uL (ref 0.0–0.1)
EOS%: 2.8 % (ref 0.0–7.0)
Eosinophils Absolute: 0.2 10*3/uL (ref 0.0–0.5)
HCT: 40.4 % (ref 38.4–49.9)
HGB: 14.1 g/dL (ref 13.0–17.1)
LYMPH%: 34.4 % (ref 14.0–49.0)
MCH: 33.3 pg (ref 27.2–33.4)
MCHC: 34.9 g/dL (ref 32.0–36.0)
MCV: 95.5 fL (ref 79.3–98.0)
MONO#: 0.7 10*3/uL (ref 0.1–0.9)
MONO%: 13.5 % (ref 0.0–14.0)
NEUT#: 2.6 10*3/uL (ref 1.5–6.5)
NEUT%: 48.8 % (ref 39.0–75.0)
Platelets: 187 10*3/uL (ref 140–400)
RBC: 4.23 10*6/uL (ref 4.20–5.82)
RDW: 12.7 % (ref 11.0–14.6)
WBC: 5.3 10*3/uL (ref 4.0–10.3)
lymph#: 1.8 10*3/uL (ref 0.9–3.3)

## 2011-03-09 LAB — COMPREHENSIVE METABOLIC PANEL
ALT: 29 U/L (ref 0–53)
AST: 25 U/L (ref 0–37)
Albumin: 3.8 g/dL (ref 3.5–5.2)
Alkaline Phosphatase: 70 U/L (ref 39–117)
BUN: 18 mg/dL (ref 6–23)
CO2: 26 mEq/L (ref 19–32)
Calcium: 8.4 mg/dL (ref 8.4–10.5)
Chloride: 102 mEq/L (ref 96–112)
Creatinine, Ser: 0.75 mg/dL (ref 0.50–1.35)
Glucose, Bld: 91 mg/dL (ref 70–99)
Potassium: 3.8 mEq/L (ref 3.5–5.3)
Sodium: 136 mEq/L (ref 135–145)
Total Bilirubin: 0.6 mg/dL (ref 0.3–1.2)
Total Protein: 6.4 g/dL (ref 6.0–8.3)

## 2011-03-09 LAB — FERRITIN: Ferritin: 69 ng/mL (ref 22–322)

## 2011-03-12 ENCOUNTER — Encounter (HOSPITAL_BASED_OUTPATIENT_CLINIC_OR_DEPARTMENT_OTHER): Payer: BC Managed Care – PPO | Admitting: Hematology and Oncology

## 2011-03-16 ENCOUNTER — Other Ambulatory Visit: Payer: Self-pay | Admitting: Cardiology

## 2011-03-16 MED ORDER — RAMIPRIL 5 MG PO CAPS: 5.0000 mg | ORAL_CAPSULE | Freq: Every day | ORAL | Status: DC

## 2011-03-16 NOTE — Telephone Encounter (Signed)
Med refill

## 2011-05-04 ENCOUNTER — Ambulatory Visit (INDEPENDENT_AMBULATORY_CARE_PROVIDER_SITE_OTHER): Payer: BC Managed Care – PPO | Admitting: Family Medicine

## 2011-05-04 ENCOUNTER — Encounter: Payer: Self-pay | Admitting: Family Medicine

## 2011-05-04 VITALS — BP 132/90 | HR 80 | Ht 67.0 in | Wt 212.0 lb

## 2011-05-04 DIAGNOSIS — B029 Zoster without complications: Secondary | ICD-10-CM

## 2011-05-04 NOTE — Progress Notes (Signed)
  Subjective:    Patient ID: Jesse Mcclure, male    DOB: April 19, 1951, 60 y.o.   MRN: [id number]  HPI He is here for recheck on shingles. He was diagnosed approximately 2 weeks ago and treated appropriately with antivirals. He is on ibuprofen and having less pain. He states she is roughly 50% better.   Review of Systems     Objective:   Physical Exam  alert and in no distress. The left flank area does show evidence of resolving rash.       Assessment & Plan:  Shingles Recommend he continue on ibuprofen. Discussed the diagnosis of PHN with him. If his symptoms last for the next couple months, I will place him on the appropriate medicine.

## 2011-05-04 NOTE — Patient Instructions (Signed)
Stay on the ibuprofen 800 mg 3 times a day and if your symptoms last for several more months, come back.

## 2011-06-16 ENCOUNTER — Other Ambulatory Visit: Payer: Self-pay | Admitting: Hematology and Oncology

## 2011-06-16 ENCOUNTER — Encounter (HOSPITAL_BASED_OUTPATIENT_CLINIC_OR_DEPARTMENT_OTHER): Payer: BC Managed Care – PPO | Admitting: Hematology and Oncology

## 2011-06-16 LAB — CBC WITH DIFFERENTIAL/PLATELET
BASO%: 0.6 % (ref 0.0–2.0)
Basophils Absolute: 0 10*3/uL (ref 0.0–0.1)
EOS%: 3 % (ref 0.0–7.0)
Eosinophils Absolute: 0.2 10*3/uL (ref 0.0–0.5)
HCT: 42.8 % (ref 38.4–49.9)
HGB: 14.9 g/dL (ref 13.0–17.1)
LYMPH%: 33.4 % (ref 14.0–49.0)
MCH: 33.5 pg — ABNORMAL HIGH (ref 27.2–33.4)
MCHC: 34.9 g/dL (ref 32.0–36.0)
MCV: 96.1 fL (ref 79.3–98.0)
MONO#: 0.7 10*3/uL (ref 0.1–0.9)
MONO%: 12.6 % (ref 0.0–14.0)
NEUT#: 2.8 10*3/uL (ref 1.5–6.5)
NEUT%: 50.4 % (ref 39.0–75.0)
Platelets: 198 10*3/uL (ref 140–400)
RBC: 4.46 10*6/uL (ref 4.20–5.82)
RDW: 12.5 % (ref 11.0–14.6)
WBC: 5.6 10*3/uL (ref 4.0–10.3)
lymph#: 1.9 10*3/uL (ref 0.9–3.3)

## 2011-06-16 LAB — IRON AND TIBC
%SAT: 59 % — ABNORMAL HIGH (ref 20–55)
Iron: 169 ug/dL — ABNORMAL HIGH (ref 42–165)
TIBC: 286 ug/dL (ref 215–435)
UIBC: 117 ug/dL — ABNORMAL LOW (ref 125–400)

## 2011-06-16 LAB — COMPREHENSIVE METABOLIC PANEL
ALT: 27 U/L (ref 0–53)
AST: 26 U/L (ref 0–37)
Albumin: 3.9 g/dL (ref 3.5–5.2)
Alkaline Phosphatase: 72 U/L (ref 39–117)
BUN: 21 mg/dL (ref 6–23)
CO2: 25 mEq/L (ref 19–32)
Calcium: 8.6 mg/dL (ref 8.4–10.5)
Chloride: 104 mEq/L (ref 96–112)
Creatinine, Ser: 0.82 mg/dL (ref 0.50–1.35)
Glucose, Bld: 95 mg/dL (ref 70–99)
Potassium: 4.1 mEq/L (ref 3.5–5.3)
Sodium: 138 mEq/L (ref 135–145)
Total Bilirubin: 0.8 mg/dL (ref 0.3–1.2)
Total Protein: 6.8 g/dL (ref 6.0–8.3)

## 2011-06-16 LAB — FERRITIN: Ferritin: 77 ng/mL (ref 22–322)

## 2011-06-26 ENCOUNTER — Encounter (HOSPITAL_BASED_OUTPATIENT_CLINIC_OR_DEPARTMENT_OTHER): Payer: BC Managed Care – PPO | Admitting: Hematology and Oncology

## 2011-08-09 ENCOUNTER — Telehealth: Payer: Self-pay | Admitting: Hematology and Oncology

## 2011-09-13 ENCOUNTER — Other Ambulatory Visit: Payer: Self-pay | Admitting: Hematology and Oncology

## 2011-09-15 ENCOUNTER — Other Ambulatory Visit: Payer: Self-pay | Admitting: Hematology and Oncology

## 2011-09-15 ENCOUNTER — Other Ambulatory Visit (HOSPITAL_BASED_OUTPATIENT_CLINIC_OR_DEPARTMENT_OTHER): Payer: BC Managed Care – PPO | Admitting: Lab

## 2011-09-15 LAB — CBC WITH DIFFERENTIAL/PLATELET
BASO%: 0.6 % (ref 0.0–2.0)
Basophils Absolute: 0 10*3/uL (ref 0.0–0.1)
EOS%: 3.4 % (ref 0.0–7.0)
Eosinophils Absolute: 0.2 10*3/uL (ref 0.0–0.5)
HCT: 41.7 % (ref 38.4–49.9)
HGB: 14.4 g/dL (ref 13.0–17.1)
LYMPH%: 34.8 % (ref 14.0–49.0)
MCH: 33.5 pg — ABNORMAL HIGH (ref 27.2–33.4)
MCHC: 34.7 g/dL (ref 32.0–36.0)
MCV: 96.6 fL (ref 79.3–98.0)
MONO#: 0.7 10*3/uL (ref 0.1–0.9)
MONO%: 13.8 % (ref 0.0–14.0)
NEUT#: 2.5 10*3/uL (ref 1.5–6.5)
NEUT%: 47.4 % (ref 39.0–75.0)
Platelets: 192 10*3/uL (ref 140–400)
RBC: 4.31 10*6/uL (ref 4.20–5.82)
RDW: 12.4 % (ref 11.0–14.6)
WBC: 5.2 10*3/uL (ref 4.0–10.3)
lymph#: 1.8 10*3/uL (ref 0.9–3.3)

## 2011-09-15 LAB — COMPREHENSIVE METABOLIC PANEL
ALT: 28 U/L (ref 0–53)
AST: 26 U/L (ref 0–37)
Albumin: 3.7 g/dL (ref 3.5–5.2)
Alkaline Phosphatase: 68 U/L (ref 39–117)
BUN: 20 mg/dL (ref 6–23)
CO2: 25 mEq/L (ref 19–32)
Calcium: 8.7 mg/dL (ref 8.4–10.5)
Chloride: 106 mEq/L (ref 96–112)
Creatinine, Ser: 0.83 mg/dL (ref 0.50–1.35)
Glucose, Bld: 75 mg/dL (ref 70–99)
Potassium: 3.9 mEq/L (ref 3.5–5.3)
Sodium: 139 mEq/L (ref 135–145)
Total Bilirubin: 0.6 mg/dL (ref 0.3–1.2)
Total Protein: 6.7 g/dL (ref 6.0–8.3)

## 2011-09-15 LAB — IRON AND TIBC
%SAT: 39 % (ref 20–55)
Iron: 107 ug/dL (ref 42–165)
TIBC: 277 ug/dL (ref 215–435)
UIBC: 170 ug/dL (ref 125–400)

## 2011-09-15 LAB — FERRITIN: Ferritin: 60 ng/mL (ref 22–322)

## 2011-09-16 ENCOUNTER — Encounter: Payer: Self-pay | Admitting: *Deleted

## 2011-09-17 ENCOUNTER — Telehealth: Payer: Self-pay | Admitting: Hematology and Oncology

## 2011-09-17 ENCOUNTER — Ambulatory Visit (HOSPITAL_BASED_OUTPATIENT_CLINIC_OR_DEPARTMENT_OTHER): Payer: BC Managed Care – PPO

## 2011-09-17 ENCOUNTER — Ambulatory Visit (HOSPITAL_BASED_OUTPATIENT_CLINIC_OR_DEPARTMENT_OTHER): Payer: BC Managed Care – PPO | Admitting: Hematology and Oncology

## 2011-09-17 NOTE — Progress Notes (Signed)
1014-Phlebotomy complete to left ac without difficulty.  520 grams without difficulty.  Pt ate snack and observed for 30 minutes post phlebotomy.  VS stable and dressing clean, dry and intact to left ac.  Pt has schedule for March complete.

## 2011-09-17 NOTE — Telephone Encounter (Signed)
gve the pt his march,june 2013 appt calendar

## 2011-09-17 NOTE — Progress Notes (Signed)
CC:   Jesse Mcclure. Abigail Miyamoto, M.D.  IDENTIFYING STATEMENT:  The patient is a 60 year old man with hemochromatosis who presents for followup.  INTERIM HISTORY:  Jesse Mcclure has no issues or concerns.  His weight is stable.  He denies pain.  He was last phlebotomized on 06/26/2011 for a ferritin of 60.  MEDICATIONS:  Reviewed and updated.  ALLERGIES:  None.  PHYSICAL EXAMINATION:  Well-appearing, well-nourished man in no distress.  Vitals:  Pulse 76, blood pressure 163/95, temp 98.6, respirations 20, weight 212 pounds.  HEENT:  Head is atraumatic, normocephalic.  Sclerae anicteric.  Mouth moist.  Chest:  Clear.  CVS: Unremarkable.  Abdomen:  Soft.  Bowel sounds present.  Extremities:  No edema.  LABORATORY DATA:  On 09/15/2011 white count 5.2, hemoglobin 14.4, hematocrit 41.7, platelets 192.  Sodium 139, potassium 3.9, chloride 106, CO2 25, BUN 20, creatinine 0.83, glucose 75, t bilirubin 0.6, alkaline phosphatase 68, AST 26, ALT 28, calcium 8.7.  Ferritin 60 (77), iron 107, TIBC 277, saturation 39%.  IMPRESSION AND PLAN:  Jesse Mcclure is a 60 year old man with hemochromatosis.  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  His ferritin level is above 50 and he will require a phlebotomy after he leaves the clinic today.  He will return for a CBC in 3 months' time with a potential phlebotomy at that time.  He follows up in 6 months' time.  In addition to that visit, I will have him get an ultrasound of his liver and alpha fetoprotein.    ______________________________ Laurice Record, M.D. LIO/MEDQ  D:  09/17/2011  T:  09/17/2011  Job:  [id number]

## 2011-09-17 NOTE — Progress Notes (Signed)
This office note has been dictated.

## 2011-12-15 ENCOUNTER — Other Ambulatory Visit (HOSPITAL_BASED_OUTPATIENT_CLINIC_OR_DEPARTMENT_OTHER): Payer: BC Managed Care – PPO | Admitting: Lab

## 2011-12-15 LAB — CBC WITH DIFFERENTIAL/PLATELET
BASO%: 0.6 % (ref 0.0–2.0)
Basophils Absolute: 0 10*3/uL (ref 0.0–0.1)
EOS%: 3.3 % (ref 0.0–7.0)
Eosinophils Absolute: 0.2 10*3/uL (ref 0.0–0.5)
HCT: 45.7 % (ref 38.4–49.9)
HGB: 15.5 g/dL (ref 13.0–17.1)
LYMPH%: 35.6 % (ref 14.0–49.0)
MCH: 33 pg (ref 27.2–33.4)
MCHC: 34 g/dL (ref 32.0–36.0)
MCV: 97.1 fL (ref 79.3–98.0)
MONO#: 0.7 10*3/uL (ref 0.1–0.9)
MONO%: 13.1 % (ref 0.0–14.0)
NEUT#: 2.5 10*3/uL (ref 1.5–6.5)
NEUT%: 47.4 % (ref 39.0–75.0)
Platelets: 207 10*3/uL (ref 140–400)
RBC: 4.7 10*6/uL (ref 4.20–5.82)
RDW: 12.8 % (ref 11.0–14.6)
WBC: 5.3 10*3/uL (ref 4.0–10.3)
lymph#: 1.9 10*3/uL (ref 0.9–3.3)

## 2011-12-15 LAB — FERRITIN: Ferritin: 48 ng/mL (ref 22–322)

## 2011-12-17 ENCOUNTER — Ambulatory Visit: Payer: BC Managed Care – PPO

## 2011-12-17 NOTE — Progress Notes (Signed)
S/w Dr Dalene Carrow, ferritin on 3/12 is 48. Per Dr Dalene Carrow pt can choose to have phleb or not,  the pt opted not to get phlebotomy today. He stated he felt fine.  He is aware his next appt is 03/17/12.

## 2011-12-28 ENCOUNTER — Other Ambulatory Visit: Payer: Self-pay | Admitting: Cardiology

## 2011-12-31 ENCOUNTER — Other Ambulatory Visit: Payer: Self-pay | Admitting: Cardiology

## 2011-12-31 MED ORDER — RAMIPRIL 5 MG PO CAPS: 5.0000 mg | ORAL_CAPSULE | Freq: Every day | ORAL | Status: DC

## 2011-12-31 NOTE — Telephone Encounter (Signed)
Refill - Ramipril (ALTACE) 5 MG capsule    Patient tired to refill BP med Ramipril, was told by pharmacist that Dr. Rosezella Rumpf stating patient need to be seen in the office. Patient has sched F/u Appnt for 5/29 w/ [place].  Please send temp supply for RX to verified preferred [facility] on [address] in [place].  Patient can be reached at [phone] for additional info

## 2011-12-31 NOTE — Telephone Encounter (Signed)
Ramipril 5 mg refilled enough to last to appointment 03/02/12.

## 2012-02-03 ENCOUNTER — Ambulatory Visit (INDEPENDENT_AMBULATORY_CARE_PROVIDER_SITE_OTHER): Payer: BC Managed Care – PPO | Admitting: Family Medicine

## 2012-02-03 ENCOUNTER — Encounter: Payer: Self-pay | Admitting: Family Medicine

## 2012-02-03 ENCOUNTER — Telehealth: Payer: Self-pay

## 2012-02-03 VITALS — BP 124/76 | HR 100 | Temp 98.3°F | Wt 200.0 lb

## 2012-02-03 DIAGNOSIS — A084 Viral intestinal infection, unspecified: Secondary | ICD-10-CM

## 2012-02-03 DIAGNOSIS — A09 Infectious gastroenteritis and colitis, unspecified: Secondary | ICD-10-CM

## 2012-02-03 NOTE — Telephone Encounter (Signed)
Open this in error

## 2012-02-03 NOTE — Patient Instructions (Signed)
Continue on Imodium and you can use 8 of those a day if you need to. Eat whatever you want. Drink plenty of fluids and stuff like Gatorade would be also useful

## 2012-02-03 NOTE — Progress Notes (Signed)
  Subjective:    Patient ID: Jesse Mcclure, male    DOB: 15-Jun-1951, 61 y.o.   MRN: [id number]  HPI He has a four-day history of started with vomiting followed by diarrhea but no fever, chills, blood or pus in his stool. His partner had a similar episode just prior to this. He recently took Imodium which did help.   Review of Systems     Objective:   Physical Exam alert and in no distress. Tympanic membranes and canals are normal. Throat is clear. Tonsils are normal. Neck is supple without adenopathy or thyromegaly. Cardiac exam shows a regular sinus rhythm without murmurs or gallops. Lungs are clear to auscultation. Donald exam shows no masses with active bowel sounds       Assessment & Plan:   1. Viral gastroenteritis    recommend continuing Imodium, fluids and eat anything he wants. He will call if further trouble.

## 2012-02-04 ENCOUNTER — Telehealth: Payer: Self-pay | Admitting: Family Medicine

## 2012-02-04 ENCOUNTER — Encounter: Payer: Self-pay | Admitting: Family Medicine

## 2012-02-04 NOTE — Telephone Encounter (Signed)
He is using Imodium 2 tablets 4 times per day. Recommend continued heat whatever he feels comfortable eating except milk and milk products continue to call Monday call me

## 2012-03-02 ENCOUNTER — Encounter: Payer: Self-pay | Admitting: Cardiology

## 2012-03-02 ENCOUNTER — Ambulatory Visit (INDEPENDENT_AMBULATORY_CARE_PROVIDER_SITE_OTHER): Payer: BC Managed Care – PPO | Admitting: Cardiology

## 2012-03-02 VITALS — BP 140/90 | HR 90 | Ht 66.0 in | Wt 208.0 lb

## 2012-03-02 DIAGNOSIS — I10 Essential (primary) hypertension: Secondary | ICD-10-CM | POA: Insufficient documentation

## 2012-03-02 DIAGNOSIS — R0609 Other forms of dyspnea: Secondary | ICD-10-CM

## 2012-03-02 DIAGNOSIS — E785 Hyperlipidemia, unspecified: Secondary | ICD-10-CM

## 2012-03-02 DIAGNOSIS — R0989 Other specified symptoms and signs involving the circulatory and respiratory systems: Secondary | ICD-10-CM

## 2012-03-02 DIAGNOSIS — R0600 Dyspnea, unspecified: Secondary | ICD-10-CM

## 2012-03-02 MED ORDER — RAMIPRIL 5 MG PO CAPS: 5.0000 mg | ORAL_CAPSULE | Freq: Every day | ORAL | Status: DC

## 2012-03-02 NOTE — Patient Instructions (Signed)
Continue your medication  I will see you as needed.  

## 2012-03-02 NOTE — Progress Notes (Signed)
   Jesse Mcclure Date of Birth: Apr 08, 1951 Medical Record #[id number]  History of Present Illness: Jesse Mcclure is seen for followup evaluation. He was last seen in December 2011. He was initially referred for evaluation of dyspnea. Cardiac evaluation was unremarkable. He had a normal stress Cardiolite study in January 2011. Ejection fraction was 67%. He does have a history of hypertension and hypercholesterolemia. He denies any significant cardiac complaints today. He has been trying to get more exercise. He reports his blood pressure at work has been good. He has no chest pain, shortness of breath, or palpitations.  Current Outpatient Prescriptions on File Prior to Visit  Medication Sig Dispense Refill  . ibuprofen (ADVIL,MOTRIN) 200 MG tablet Take 800 mg by mouth every 8 (eight) hours as needed.        Marland Kitchen DISCONTD: ramipril (ALTACE) 5 MG capsule Take 1 capsule (5 mg total) by mouth daily.  30 capsule  2    No Known Allergies  Past Medical History  Diagnosis Date  . Hemochromatosis   . Hyperlipidemia   . Polio   . Degenerative disc disease   . BPH (benign prostatic hypertrophy)   . Status post Nissen fundoplication (without gastrostomy tube) procedure     for hiatal hernia.  Marland Kitchen HTN (hypertension)     Past Surgical History  Procedure Date  . Foot surgery   . Hiatal hernia repair   . Other surgical history     Cataract Surgery--Both eyes    History  Smoking status  . Never Smoker   Smokeless tobacco  . Never Used    History  Alcohol Use  . 2.0 oz/week  . 4 drink(s) per week    Family History  Problem Relation Age of Onset  . Breast cancer Mother   . Breast cancer Sister     Review of Systems: As noted in history of present illness..  All other systems were reviewed and are negative.  Physical Exam: BP 140/90  Pulse 90  Ht 5\' 6"  (1.676 m)  Wt 208 lb (94.348 kg)  BMI 33.57 kg/m2 He is an obese white male in no acute distress. HEENT exam is unremarkable. He has  no JVD or bruits. Lungs are clear. Cardiac exam reveals a regular rate and rhythm without gallop, murmur, or click. Abdomen is soft and nontender. He has no masses or bruits. No hepatosplenomegaly. Extremities are without edema. Pedal pulses are good. LABORATORY DATA:  ECG today demonstrates normal sinus rhythm with a nonspecific T-wave abnormality. Assessment / Plan:

## 2012-03-02 NOTE — Assessment & Plan Note (Signed)
Blood pressure is mildly elevated today. He is going to continue to monitor his blood pressure working keep a diary. Since he has no active cardiac issues I'll see him back on a when necessary basis.

## 2012-03-07 ENCOUNTER — Telehealth: Payer: Self-pay | Admitting: *Deleted

## 2012-03-07 NOTE — Telephone Encounter (Signed)
Called pt on cell phone and left message on voice mail re:  F/U appt has been changed to  03/18/12 at 0915 am with possible phlebotomy to follow.   Appts for lab and US Abdomen still unchanged for 03/15/12.   Asked pt to call nurse back to confirm that he has received nurse message.

## 2012-03-10 ENCOUNTER — Telehealth: Payer: Self-pay | Admitting: *Deleted

## 2012-03-10 NOTE — Telephone Encounter (Signed)
Called pt again and left message on voice mail to remind pt of appts :    Lab and  Abdominal  US on 03/15/12 ;   F/U  Visit and  Phlebotomy  On  03/18/12.    Asked pt to call office back to confirm that he has received message.

## 2012-03-15 ENCOUNTER — Other Ambulatory Visit: Payer: BC Managed Care – PPO | Admitting: Lab

## 2012-03-15 ENCOUNTER — Ambulatory Visit (HOSPITAL_COMMUNITY)
Admission: RE | Admit: 2012-03-15 | Discharge: 2012-03-15 | Disposition: A | Discharge status: Home / Self Care | Payer: BC Managed Care – PPO | Source: Ambulatory Visit | Attending: Hematology and Oncology | Admitting: Hematology and Oncology

## 2012-03-15 ENCOUNTER — Other Ambulatory Visit (HOSPITAL_BASED_OUTPATIENT_CLINIC_OR_DEPARTMENT_OTHER): Payer: BC Managed Care – PPO | Admitting: Lab

## 2012-03-15 ENCOUNTER — Other Ambulatory Visit: Payer: Self-pay | Admitting: Hematology and Oncology

## 2012-03-15 DIAGNOSIS — I10 Essential (primary) hypertension: Secondary | ICD-10-CM | POA: Insufficient documentation

## 2012-03-15 LAB — CBC WITH DIFFERENTIAL/PLATELET
BASO%: 0.9 % (ref 0.0–2.0)
Basophils Absolute: 0 10*3/uL (ref 0.0–0.1)
EOS%: 3.5 % (ref 0.0–7.0)
Eosinophils Absolute: 0.2 10*3/uL (ref 0.0–0.5)
HCT: 44.6 % (ref 38.4–49.9)
HGB: 15.3 g/dL (ref 13.0–17.1)
LYMPH%: 27.8 % (ref 14.0–49.0)
MCH: 33.3 pg (ref 27.2–33.4)
MCHC: 34.4 g/dL (ref 32.0–36.0)
MCV: 96.9 fL (ref 79.3–98.0)
MONO#: 0.7 10*3/uL (ref 0.1–0.9)
MONO%: 12.8 % (ref 0.0–14.0)
NEUT#: 2.8 10*3/uL (ref 1.5–6.5)
NEUT%: 55 % (ref 39.0–75.0)
Platelets: 220 10*3/uL (ref 140–400)
RBC: 4.6 10*6/uL (ref 4.20–5.82)
RDW: 13.1 % (ref 11.0–14.6)
WBC: 5.1 10*3/uL (ref 4.0–10.3)
lymph#: 1.4 10*3/uL (ref 0.9–3.3)

## 2012-03-15 LAB — FERRITIN: Ferritin: 91 ng/mL (ref 22–322)

## 2012-03-15 LAB — AFP TUMOR MARKER: AFP-Tumor Marker: 1.5 ng/mL (ref 0.0–8.0)

## 2012-03-17 ENCOUNTER — Other Ambulatory Visit (HOSPITAL_COMMUNITY): Payer: BC Managed Care – PPO

## 2012-03-17 ENCOUNTER — Ambulatory Visit: Payer: BC Managed Care – PPO | Admitting: Hematology and Oncology

## 2012-03-18 ENCOUNTER — Ambulatory Visit (HOSPITAL_BASED_OUTPATIENT_CLINIC_OR_DEPARTMENT_OTHER): Payer: BC Managed Care – PPO | Admitting: Hematology and Oncology

## 2012-03-18 ENCOUNTER — Ambulatory Visit (HOSPITAL_BASED_OUTPATIENT_CLINIC_OR_DEPARTMENT_OTHER): Payer: BC Managed Care – PPO

## 2012-03-18 ENCOUNTER — Telehealth: Payer: Self-pay | Admitting: *Deleted

## 2012-03-18 ENCOUNTER — Telehealth: Payer: Self-pay | Admitting: Hematology and Oncology

## 2012-03-18 ENCOUNTER — Encounter: Payer: Self-pay | Admitting: Hematology and Oncology

## 2012-03-18 NOTE — Patient Instructions (Signed)
JUANDIEGO KOLENOVIC  [id number]  [facility] Discharge Instructions  RECOMMENDATIONS MADE BY THE CONSULTANT AND ANY TEST RESULTS WILL BE SENT TO YOUR REFERRING DOCTOR.   EXAM FINDINGS BY MD TODAY AND SIGNS AND SYMPTOMS TO REPORT TO CLINIC OR PRIMARY MD:   Your current list of medications are: Current Outpatient Prescriptions  Medication Sig Dispense Refill  . ibuprofen (ADVIL,MOTRIN) 200 MG tablet Take 800 mg by mouth every 8 (eight) hours as needed.        . ramipril (ALTACE) 5 MG capsule Take 1 capsule (5 mg total) by mouth daily.  30 capsule  11     INSTRUCTIONS GIVEN AND DISCUSSED:   SPECIAL INSTRUCTIONS/FOLLOW-UP:  See above.  I acknowledge that I have been informed and understand all the instructions given to me and received a copy. I do not have any more questions at this time, but understand that I may call the [facility] at [phone] during business hours should I have any further questions or need assistance in obtaining follow-up care.

## 2012-03-18 NOTE — Progress Notes (Signed)
1115- blood return from phlebotomy.  Pt requests not to be stuck again, has appt for therapeutic phlebotomy next week-dhp, rn

## 2012-03-18 NOTE — Telephone Encounter (Signed)
appts made,email to mw to add phlebot and print for pt aom

## 2012-03-18 NOTE — Progress Notes (Signed)
This office note has been dictated.

## 2012-03-18 NOTE — Patient Instructions (Signed)
Therapeutic Phlebotomy Therapeutic phlebotomy is the controlled removal of blood from your body for the purpose of treating a medical condition. It is similar to donating blood. Usually, about a pint (470 mL) of blood is removed. The average adult has 9 to 12 pints (4.3 to 5.7 L) of blood. Therapeutic phlebotomy may be used to treat the following medical conditions:  Hemochromatosis. This is a condition in which there is too much iron in the blood.   Polycythemia vera. This is a condition in which there are too many red cells in the blood.   Porphyria cutanea tarda. This is a disease usually passed from one generation to the next (inherited). It is a condition in which an important part of hemoglobin is not made properly. This results in the build up of abnormal amounts of porphyrins in the body.   Sickle cell disease. This is an inherited disease. It is a condition in which the red blood cells form an abnormal crescent shape rather than a round shape.  LET YOUR CAREGIVER KNOW ABOUT:  Allergies.   Medicines taken including herbs, eyedrops, over-the-counter medicines, and creams.   Use of steroids (by mouth or creams).   Previous problems with anesthetics or numbing medicine.   History of blood clots.   History of bleeding or blood problems.   Previous surgery.   Possibility of pregnancy, if this applies.  RISKS AND COMPLICATIONS This is a simple and safe procedure. Problems are unlikely. However, problems can occur and may include:  Nausea or lightheadedness.   Low blood pressure.   Soreness, bleeding, swelling, or bruising at the needle insertion site.   Infection.  BEFORE THE PROCEDURE  This is a procedure that can be done as an outpatient. Confirm the time that you need to arrive for your procedure. Confirm whether there is a need to fast or withhold any medications. It is helpful to wear clothing with sleeves that can be raised above the elbow. A blood sample may be done  to determine the amount of red blood cells or iron in your blood. Plan ahead of time to have someone drive you home after the procedure. PROCEDURE The entire procedure from preparation through recovery takes about 1 hour. The actual collection takes about 10 to 15 minutes.  A needle will be inserted into your vein.   Tubing and a collection bag will be attached to that needle.   Blood will flow through the needle and tubing into the collection bag.   You may be asked to open and close your hand slowly and continuously during the entire collection.   Once the specified amount of blood has been removed from your body, the collection bag and tubing will be clamped.   The needle will be removed.   Pressure will be held on the site of the needle insertion to stop the bleeding. Then a bandage will be placed over the needle insertion site.  AFTER THE PROCEDURE  Your recovery will be assessed and monitored. If there are no problems, as an outpatient, you should be able to go home shortly after the procedure.  Document Released: 02/23/2011 Document Revised: 09/10/2011 Document Reviewed: 02/23/2011 Exit[employer] Patient Information 2012 [employer]. 

## 2012-03-18 NOTE — Telephone Encounter (Signed)
Per staff message I have scheudled appts.  JMW  

## 2012-03-18 NOTE — Progress Notes (Signed)
CC:   Jesse Mcclure. Jesse Mcclure, M.D.  IDENTIFYING STATEMENT:  The patient is a 61 year old man with hemochromatosis who presents for followup.  INTERVAL HISTORY:  Jesse Mcclure was last seen 6 months ago.  He was last phlebotomized on 06/26/2011.  He has had no weight loss.  Denies pain.  MEDICATIONS:  Reviewed and updated.  ALLERGIES:  None.  PHYSICAL EXAM:  The patient is a well-appearing, well-nourished man in no distress.  Vitals:  Pulse 91, blood pressure 160/100, temperature 98, respirations 20, weight is 209.3 pounds.  HEENT:  Head is atraumatic, normocephalic.  Sclerae anicteric.  Mouth moist.  Neck:  Supple.  Chest: Clear.  Abdomen:  Soft, nontender.  Bowel sounds present.  Extremities: No edema.  LAB DATA:  03/15/2012 white cell count 5.1, hemoglobin 15.3, hematocrit 44.6, platelets 220.  Ferritin 91 (48).  Alpha fetoprotein 1.5.  An ultrasound of the abdomen on 03/15/2012 showed increased echogenicity of the hepatic parenchymal echotexture without evidence of mass.  There is no significant change from prior study.  IMPRESSION AND PLAN:  Jesse Mcclure is a 61 year old man with hemochromatosis and is homozygous for [id number] mutation.  His ferritin levels are elevated and greater than 50, thus he will require phlebotomy.  He will receive a phlebotomy today and next week (does not require blood work next week).  He will return every 3 months for a CBC and ferritin level, and if the results are above 50, he will require a phlebotomy.  He follows up in 6 months' time.    ______________________________ Laurice Record, M.D. LIO/MEDQ  D:  03/18/2012  T:  03/18/2012  Job:  [id number]

## 2012-03-25 ENCOUNTER — Ambulatory Visit (HOSPITAL_BASED_OUTPATIENT_CLINIC_OR_DEPARTMENT_OTHER): Payer: BC Managed Care – PPO

## 2012-03-25 NOTE — Progress Notes (Signed)
1105 Phlebotomy completed with 550g removed. Patient tolerated treatment well with no complaints. 1135 30-minute post phlebotomy observation.  VSS. Patient with no complaints. Patient consumed one soda and refused snacks. Discharged ambulating and AVS given.

## 2012-03-25 NOTE — Patient Instructions (Signed)
Therapeutic Phlebotomy Therapeutic phlebotomy is the controlled removal of blood from your body for the purpose of treating a medical condition. It is similar to donating blood. Usually, about a pint (470 mL) of blood is removed. The average adult has 9 to 12 pints (4.3 to 5.7 L) of blood. Therapeutic phlebotomy may be used to treat the following medical conditions:  Hemochromatosis. This is a condition in which there is too much iron in the blood.   Polycythemia vera. This is a condition in which there are too many red cells in the blood.   Porphyria cutanea tarda. This is a disease usually passed from one generation to the next (inherited). It is a condition in which an important part of hemoglobin is not made properly. This results in the build up of abnormal amounts of porphyrins in the body.   Sickle cell disease. This is an inherited disease. It is a condition in which the red blood cells form an abnormal crescent shape rather than a round shape.  LET YOUR CAREGIVER KNOW ABOUT:  Allergies.   Medicines taken including herbs, eyedrops, over-the-counter medicines, and creams.   Use of steroids (by mouth or creams).   Previous problems with anesthetics or numbing medicine.   History of blood clots.   History of bleeding or blood problems.   Previous surgery.   Possibility of pregnancy, if this applies.  RISKS AND COMPLICATIONS This is a simple and safe procedure. Problems are unlikely. However, problems can occur and may include:  Nausea or lightheadedness.   Low blood pressure.   Soreness, bleeding, swelling, or bruising at the needle insertion site.   Infection.  BEFORE THE PROCEDURE  This is a procedure that can be done as an outpatient. Confirm the time that you need to arrive for your procedure. Confirm whether there is a need to fast or withhold any medications. It is helpful to wear clothing with sleeves that can be raised above the elbow. A blood sample may be done  to determine the amount of red blood cells or iron in your blood. Plan ahead of time to have someone drive you home after the procedure. PROCEDURE The entire procedure from preparation through recovery takes about 1 hour. The actual collection takes about 10 to 15 minutes.  A needle will be inserted into your vein.   Tubing and a collection bag will be attached to that needle.   Blood will flow through the needle and tubing into the collection bag.   You may be asked to open and close your hand slowly and continuously during the entire collection.   Once the specified amount of blood has been removed from your body, the collection bag and tubing will be clamped.   The needle will be removed.   Pressure will be held on the site of the needle insertion to stop the bleeding. Then a bandage will be placed over the needle insertion site.  AFTER THE PROCEDURE  Your recovery will be assessed and monitored. If there are no problems, as an outpatient, you should be able to go home shortly after the procedure.  Document Released: 02/23/2011 Document Revised: 09/10/2011 Document Reviewed: 02/23/2011 Exit[employer] Patient Information 2012 [employer]. 

## 2012-05-31 ENCOUNTER — Telehealth: Payer: Self-pay | Admitting: Hematology and Oncology

## 2012-05-31 NOTE — Telephone Encounter (Signed)
Pt came and wants lab appt changed to 9/17th from 9/20,

## 2012-06-03 ENCOUNTER — Encounter: Payer: Self-pay | Admitting: Family Medicine

## 2012-06-21 ENCOUNTER — Other Ambulatory Visit (HOSPITAL_BASED_OUTPATIENT_CLINIC_OR_DEPARTMENT_OTHER): Payer: BC Managed Care – PPO | Admitting: Lab

## 2012-06-21 LAB — CBC WITH DIFFERENTIAL/PLATELET
BASO%: 1.1 % (ref 0.0–2.0)
Basophils Absolute: 0.1 10*3/uL (ref 0.0–0.1)
EOS%: 3.9 % (ref 0.0–7.0)
Eosinophils Absolute: 0.2 10*3/uL (ref 0.0–0.5)
HCT: 42.6 % (ref 38.4–49.9)
HGB: 14.3 g/dL (ref 13.0–17.1)
LYMPH%: 34.1 % (ref 14.0–49.0)
MCH: 32.9 pg (ref 27.2–33.4)
MCHC: 33.6 g/dL (ref 32.0–36.0)
MCV: 97.9 fL (ref 79.3–98.0)
MONO#: 0.8 10*3/uL (ref 0.1–0.9)
MONO%: 13.9 % (ref 0.0–14.0)
NEUT#: 2.7 10*3/uL (ref 1.5–6.5)
NEUT%: 47 % (ref 39.0–75.0)
Platelets: 185 10*3/uL (ref 140–400)
RBC: 4.36 10*6/uL (ref 4.20–5.82)
RDW: 12.7 % (ref 11.0–14.6)
WBC: 5.8 10*3/uL (ref 4.0–10.3)
lymph#: 2 10*3/uL (ref 0.9–3.3)

## 2012-06-21 LAB — FERRITIN: Ferritin: 46 ng/mL (ref 22–322)

## 2012-06-22 ENCOUNTER — Telehealth: Payer: Self-pay | Admitting: *Deleted

## 2012-06-22 ENCOUNTER — Other Ambulatory Visit: Payer: Self-pay | Admitting: *Deleted

## 2012-06-22 NOTE — Telephone Encounter (Signed)
Pt returned nurse call.   Informed pt that Ferritin level 46.  Per md,  No need for phlebotomy on 06/24/12.   Informed pt that md would like for pt to have Ferritin level rechecked in Nov.    Pt stated he could do lab on 11/5 at 4 pm.  Appt scheduled for pt.

## 2012-06-22 NOTE — Telephone Encounter (Signed)
Per desk RN I have canceled apapt for 9/20. JMW

## 2012-06-22 NOTE — Telephone Encounter (Signed)
Called pt on both home and cell phones with no answers.   Left messages on both voice mail for pt to call nurse back.

## 2012-06-23 ENCOUNTER — Other Ambulatory Visit: Payer: BC Managed Care – PPO | Admitting: Lab

## 2012-06-24 ENCOUNTER — Other Ambulatory Visit: Payer: BC Managed Care – PPO

## 2012-08-09 ENCOUNTER — Other Ambulatory Visit (HOSPITAL_BASED_OUTPATIENT_CLINIC_OR_DEPARTMENT_OTHER): Payer: BC Managed Care – PPO

## 2012-08-09 ENCOUNTER — Other Ambulatory Visit: Payer: Self-pay | Admitting: *Deleted

## 2012-08-09 LAB — FERRITIN: Ferritin: 111 ng/mL (ref 22–322)

## 2012-08-10 ENCOUNTER — Telehealth: Payer: Self-pay | Admitting: *Deleted

## 2012-08-10 ENCOUNTER — Other Ambulatory Visit: Payer: Self-pay | Admitting: *Deleted

## 2012-08-10 ENCOUNTER — Other Ambulatory Visit: Payer: Self-pay | Admitting: Hematology and Oncology

## 2012-08-10 LAB — CBC WITH DIFFERENTIAL/PLATELET
BASO%: 0.9 % (ref 0.0–2.0)
Basophils Absolute: 0.1 10*3/uL (ref 0.0–0.1)
EOS%: 5 % (ref 0.0–7.0)
Eosinophils Absolute: 0.3 10*3/uL (ref 0.0–0.5)
HCT: 43.1 % (ref 38.4–49.9)
HGB: 14.8 g/dL (ref 13.0–17.1)
LYMPH%: 29.6 % (ref 14.0–49.0)
MCH: 33.9 pg — ABNORMAL HIGH (ref 27.2–33.4)
MCHC: 34.5 g/dL (ref 32.0–36.0)
MCV: 98.3 fL — ABNORMAL HIGH (ref 79.3–98.0)
MONO#: 0.8 10*3/uL (ref 0.1–0.9)
MONO%: 12.5 % (ref 0.0–14.0)
NEUT#: 3.5 10*3/uL (ref 1.5–6.5)
NEUT%: 52 % (ref 39.0–75.0)
Platelets: 200 10*3/uL (ref 140–400)
RBC: 4.38 10*6/uL (ref 4.20–5.82)
RDW: 12.9 % (ref 11.0–14.6)
WBC: 6.7 10*3/uL (ref 4.0–10.3)
lymph#: 2 10*3/uL (ref 0.9–3.3)

## 2012-08-10 NOTE — Telephone Encounter (Signed)
Attempted to call pt  X 2 on cell phone and  X 2 at home unsuccessfully.   Left messages on both voice mail for pt to call nurse back.

## 2012-08-10 NOTE — Telephone Encounter (Signed)
Spoke with pt and informed pt re:  Ferritin  111.   Informed pt that pt will need phlebotomy in Nov, and  Dec.    Informed pt that a scheduler will contact pt with appts date and time.   Pt requested appt for Nov to be  08/16/12  In the afternoon.  Confirmed f/u appt date and time in Jan. 2014.   Pt voiced understanding.

## 2012-08-11 ENCOUNTER — Telehealth: Payer: Self-pay | Admitting: Hematology and Oncology

## 2012-08-11 NOTE — Telephone Encounter (Signed)
lmonvm for pt re appt for 11/12 and 12/10. Pt to get new schedule when he comes in.

## 2012-08-16 ENCOUNTER — Ambulatory Visit (HOSPITAL_BASED_OUTPATIENT_CLINIC_OR_DEPARTMENT_OTHER): Payer: BC Managed Care – PPO

## 2012-08-19 ENCOUNTER — Telehealth: Payer: Self-pay | Admitting: Medical Oncology

## 2012-08-19 NOTE — Telephone Encounter (Signed)
I sent Onc tx summary to change phlebotomy to another day week of dec 9th

## 2012-08-22 ENCOUNTER — Telehealth: Payer: Self-pay | Admitting: *Deleted

## 2012-08-22 NOTE — Telephone Encounter (Signed)
Left voice message informing patient of the new date and time on 09-14-2012  Sent michelle email to cancel appointment on 09-13-2012 and add on 09-14-2012

## 2012-08-22 NOTE — Telephone Encounter (Signed)
Per staff message and POF I have scheduled appts.  JMW  

## 2012-09-13 ENCOUNTER — Telehealth: Payer: Self-pay | Admitting: *Deleted

## 2012-09-13 NOTE — Telephone Encounter (Signed)
Per staff phone call from desk RN I have rescheduled appts from tomorrow. Patient is sick. JMW

## 2012-09-14 ENCOUNTER — Other Ambulatory Visit: Payer: BC Managed Care – PPO | Admitting: Lab

## 2012-09-24 ENCOUNTER — Telehealth: Payer: Self-pay | Admitting: Oncology

## 2012-09-24 ENCOUNTER — Telehealth: Payer: Self-pay | Admitting: *Deleted

## 2012-09-24 NOTE — Telephone Encounter (Signed)
Called pt, left message regarding rescheduling appt with Dr. Dalene Carrow, waiting for a call back

## 2012-09-24 NOTE — Telephone Encounter (Signed)
Pt called back and informed him that Dr[facility]Odogwu leaving [facility] and new MD is now Dr. Cyndie Chime , he will be seeing Misty Stanley and phlebotomy follows on 10/21/12

## 2012-09-24 NOTE — Telephone Encounter (Signed)
Per Rose the scheduler I have moved his 1\17 appt down.   JMW

## 2012-09-30 ENCOUNTER — Other Ambulatory Visit (HOSPITAL_BASED_OUTPATIENT_CLINIC_OR_DEPARTMENT_OTHER): Payer: BC Managed Care – PPO

## 2012-09-30 ENCOUNTER — Ambulatory Visit (HOSPITAL_BASED_OUTPATIENT_CLINIC_OR_DEPARTMENT_OTHER): Payer: BC Managed Care – PPO

## 2012-09-30 LAB — FERRITIN: Ferritin: 36 ng/mL (ref 22–322)

## 2012-09-30 NOTE — Patient Instructions (Addendum)
Pt informed to drink plenty of fluids today & to leave pressure dsg to left antecubital x 5 hours & to call if problems.

## 2012-10-08 ENCOUNTER — Encounter: Payer: Self-pay | Admitting: *Deleted

## 2012-10-13 ENCOUNTER — Encounter: Payer: Self-pay | Admitting: Internal Medicine

## 2012-10-19 ENCOUNTER — Encounter: Payer: Self-pay | Admitting: Family Medicine

## 2012-10-20 ENCOUNTER — Other Ambulatory Visit: Payer: Self-pay | Admitting: *Deleted

## 2012-10-20 ENCOUNTER — Other Ambulatory Visit (HOSPITAL_BASED_OUTPATIENT_CLINIC_OR_DEPARTMENT_OTHER): Payer: PRIVATE HEALTH INSURANCE | Admitting: Lab

## 2012-10-20 LAB — CBC WITH DIFFERENTIAL/PLATELET
BASO%: 1.4 % (ref 0.0–2.0)
Basophils Absolute: 0.1 10*3/uL (ref 0.0–0.1)
EOS%: 3.7 % (ref 0.0–7.0)
Eosinophils Absolute: 0.2 10*3/uL (ref 0.0–0.5)
HCT: 42.8 % (ref 38.4–49.9)
HGB: 14.9 g/dL (ref 13.0–17.1)
LYMPH%: 31.5 % (ref 14.0–49.0)
MCH: 33.4 pg (ref 27.2–33.4)
MCHC: 34.7 g/dL (ref 32.0–36.0)
MCV: 96.3 fL (ref 79.3–98.0)
MONO#: 0.7 10*3/uL (ref 0.1–0.9)
MONO%: 13.2 % (ref 0.0–14.0)
NEUT#: 2.8 10*3/uL (ref 1.5–6.5)
NEUT%: 50.2 % (ref 39.0–75.0)
Platelets: 208 10*3/uL (ref 140–400)
RBC: 4.45 10*6/uL (ref 4.20–5.82)
RDW: 12.7 % (ref 11.0–14.6)
WBC: 5.6 10*3/uL (ref 4.0–10.3)
lymph#: 1.8 10*3/uL (ref 0.9–3.3)

## 2012-10-20 LAB — FERRITIN: Ferritin: 31 ng/mL (ref 22–322)

## 2012-10-21 ENCOUNTER — Ambulatory Visit (HOSPITAL_BASED_OUTPATIENT_CLINIC_OR_DEPARTMENT_OTHER): Payer: PRIVATE HEALTH INSURANCE | Admitting: Nurse Practitioner

## 2012-10-21 ENCOUNTER — Telehealth: Payer: Self-pay | Admitting: Oncology

## 2012-10-21 ENCOUNTER — Ambulatory Visit: Payer: BC Managed Care – PPO | Admitting: Hematology and Oncology

## 2012-10-21 ENCOUNTER — Telehealth: Payer: Self-pay | Admitting: *Deleted

## 2012-10-21 NOTE — Telephone Encounter (Signed)
Per staff phone call and POF I have schedueld appts.  JMW  

## 2012-10-21 NOTE — Progress Notes (Signed)
OFFICE PROGRESS NOTE  Interval history:  Jesse Mcclure is a 62 year old man positive for the Cys282Tyr hemochromatosis mutation. He is a homozygote. Initial diagnosis dates to 11/28/2008. Ferritin at that time was 459. He is on an intermittent phlebotomy program previously under the direction of Dr. Dalene Carrow.  He is seen today to establish care with Jesse Mcclure.  Ferritin on 08/09/2012 returned at 111. He received a phlebotomy. He was most recently phlebotomized on 09/30/2012. Ferritin at that time was 36. Most recent ferritin yesterday, 10/20/2012, was 31.  He reports tolerating the phlebotomy procedures without difficulty. Only complaint is that the line "clots" periodically necessitating a second "stick".  He has a good appetite and good energy level. He denies pain. He overall feels well. He recently began an exercise regimen.  Past medical history significant for hypertension. He is currently on Altace. Only other medication is ibuprofen on an as-needed basis. He has no medication allergies. Primary care physician is Dr. Susann Givens.   He denies tobacco use. He estimates EtOH intake at "a few cocktails a week".  He reports 2 siblings have had ferritin levels checked and both were found to be within normal range. One of the siblings has a different father. His mother passed away at age 83 with breast cancer. His father died at age 32 of "old age". One sister was diagnosed with breast cancer in her 11s.   Objective: Blood pressure 158/90, pulse 88, temperature 98.1 F (36.7 C), temperature source Oral, resp. rate 18, height 5\' 6"  (1.676 m), weight 215 lb 12.8 oz (97.886 kg).  Oropharynx is without thrush or ulceration. No palpable cervical, supraclavicular or axillary lymph nodes. Lungs are clear. No wheezes or rales. Regular cardiac rhythm. Abdomen is soft and nontender. No organomegaly. Extremities are without edema. Calves are soft and nontender.  Lab Results: Lab Results  Component Value  Date   WBC 5.6 10/20/2012   HGB 14.9 10/20/2012   HCT 42.8 10/20/2012   MCV 96.3 10/20/2012   PLT 208 10/20/2012    Chemistry:    Chemistry      Component Value Date/Time   NA 139 09/15/2011 1607   K 3.9 09/15/2011 1607   CL 106 09/15/2011 1607   CO2 25 09/15/2011 1607   BUN 20 09/15/2011 1607   CREATININE 0.83 09/15/2011 1607      Component Value Date/Time   CALCIUM 8.7 09/15/2011 1607   ALKPHOS 68 09/15/2011 1607   AST 26 09/15/2011 1607   ALT 28 09/15/2011 1607   BILITOT 0.6 09/15/2011 1607       Studies/Results: No results found.  Medications: I have reviewed the patient's current medications.  Assessment/Plan:  1. Hereditary hemochromatosis (homozygote for the Cys282Tyr hemachromatosis mutation) with initial diagnosis 11/28/2008. Ferritin at diagnosis 459. He is on an intermittent phlebotomy program. Most recent ferritin on 10/20/2012 was 31. 2. Hypertension.  Jesse Mcclure recommends labs to include a CBC and ferritin and phlebotomy on a 3 month schedule. Goal ferritin less than 150. Phlebotomy will be held if the hemoglobin is less than 12. We will check a chemistry panel on an annual basis. He will return to begin the every 3 month lab/phlebotomy program on 01/13/2013. He will return for a followup visit in 6 months. He will contact the office in the interim with any problems.  Patient was seen with Jesse Mcclure.  Lonna Cobb ANP/GNP-BC    CC Dr. Sharlot Gowda

## 2012-10-21 NOTE — Telephone Encounter (Signed)
gv and printed appt schedule for April and JUly...the patient aware..sw and emailed michelle to add tx.Marland KitchenMarland Kitchen

## 2012-10-28 ENCOUNTER — Ambulatory Visit (INDEPENDENT_AMBULATORY_CARE_PROVIDER_SITE_OTHER): Payer: PRIVATE HEALTH INSURANCE | Admitting: Family Medicine

## 2012-10-28 ENCOUNTER — Encounter: Payer: Self-pay | Admitting: Family Medicine

## 2012-10-28 VITALS — BP 130/90 | HR 78 | Ht 69.0 in | Wt 213.0 lb

## 2012-10-28 DIAGNOSIS — Z23 Encounter for immunization: Secondary | ICD-10-CM

## 2012-10-28 DIAGNOSIS — I10 Essential (primary) hypertension: Secondary | ICD-10-CM

## 2012-10-28 DIAGNOSIS — Z0000 Encounter for general adult medical examination without abnormal findings: Secondary | ICD-10-CM

## 2012-10-28 DIAGNOSIS — E785 Hyperlipidemia, unspecified: Secondary | ICD-10-CM

## 2012-10-28 NOTE — Progress Notes (Signed)
Subjective:    Patient ID: Jesse Mcclure, male    DOB: [birth year], 62 y.o.   MRN: [id number]  HPI He is here for complete examination. He does have underlying hereditary hemochromatosis and is now seeing Dr. Cyndie Chime. He had recent blood work which showed a ferritin of 31. He has started an exercise program and is working out twice per week. He has also cut back on his alcohol consumption. He continues on Altace for his blood pressure. His last colonoscopy was 2010. He did have blood work drawn at work which was reviewed. Family and social history were also reviewed. His work and home life are going quite well. Review of Systems  Constitutional: Negative.   HENT: Negative.   Eyes: Negative.   Respiratory: Negative.   Cardiovascular: Negative.   Gastrointestinal: Negative.   Genitourinary: Negative.   Musculoskeletal: Negative.   Skin: Negative.   Neurological: Negative.   Hematological: Negative.   Psychiatric/Behavioral: Negative.        Objective:   Physical Exam BP 130/90  Pulse 78  Ht 5\' 9"  (1.753 m)  Wt 213 lb (96.616 kg)  BMI 31.45 kg/m2  SpO2 98%  General Appearance:    Alert, cooperative, no distress, appears stated age  Head:    Normocephalic, without obvious abnormality, atraumatic  Eyes:    PERRL, conjunctiva/corneas clear, EOM's intact, fundi    benign  Ears:    Normal TM's and external ear canals  Nose:   Nares normal, mucosa normal, no drainage or sinus   tenderness  Throat:   Lips, mucosa, and tongue normal; teeth and gums normal  Neck:   Supple, no lymphadenopathy;  thyroid:  no   enlargement/tenderness/nodules; no carotid   bruit or JVD  Back:    Spine nontender, no curvature, ROM normal, no CVA     tenderness  Lungs:     Clear to auscultation bilaterally without wheezes, rales or     ronchi; respirations unlabored  Chest Wall:    No tenderness or deformity   Heart:    Regular rate and rhythm, S1 and S2 normal, no murmur, rub   or gallop  Breast  Exam:    No chest wall tenderness, masses or gynecomastia  Abdomen:     Soft, non-tender, nondistended, normoactive bowel sounds,    no masses, no hepatosplenomegaly  Genitalia:  deferred  Rectal:    Normal sphincter tone, no masses or tenderness; guaiac negative stool.  Prostate smooth, no nodules, not enlarged.  Extremities:   No clubbing, cyanosis or edema  Pulses:   2+ and symmetric all extremities  Skin:   Skin color, texture, turgor normal, no rashes or lesions  Lymph nodes:   Cervical, supraclavicular, and axillary nodes normal  Neurologic:   CNII-XII intact, normal strength, sensation and gait; reflexes 2+ and symmetric throughout          Psych:   Normal mood, affect, hygiene and grooming.           Assessment & Plan:   1. Routine general medical examination at a health care facility  Tdap vaccine greater than or equal to 7yo IM  2. Hemochromatosis, hereditary    3. Hyperlipidemia    4. Hypertension     I encouraged him to continue with his present exercise regimen as well as cut back on alcohol consumption and carbohydrates. Discussed possibly stopping his blood pressure medicine if he continues with his weight loss program. Tetanus shot given with risks and benefits discussed. He  did have shingles within the last year or 2 and there for Zostavax was held.

## 2012-11-24 ENCOUNTER — Encounter: Payer: Self-pay | Admitting: Cardiology

## 2013-01-13 ENCOUNTER — Other Ambulatory Visit (HOSPITAL_BASED_OUTPATIENT_CLINIC_OR_DEPARTMENT_OTHER): Payer: PRIVATE HEALTH INSURANCE | Admitting: Lab

## 2013-01-13 ENCOUNTER — Ambulatory Visit (HOSPITAL_BASED_OUTPATIENT_CLINIC_OR_DEPARTMENT_OTHER): Payer: PRIVATE HEALTH INSURANCE

## 2013-01-13 LAB — CBC WITH DIFFERENTIAL/PLATELET
BASO%: 1.1 % (ref 0.0–2.0)
Basophils Absolute: 0.1 10*3/uL (ref 0.0–0.1)
EOS%: 3.7 % (ref 0.0–7.0)
Eosinophils Absolute: 0.2 10*3/uL (ref 0.0–0.5)
HCT: 44.5 % (ref 38.4–49.9)
HGB: 15.4 g/dL (ref 13.0–17.1)
LYMPH%: 30.6 % (ref 14.0–49.0)
MCH: 33 pg (ref 27.2–33.4)
MCHC: 34.6 g/dL (ref 32.0–36.0)
MCV: 95.5 fL (ref 79.3–98.0)
MONO#: 0.7 10*3/uL (ref 0.1–0.9)
MONO%: 11.3 % (ref 0.0–14.0)
NEUT#: 3.3 10*3/uL (ref 1.5–6.5)
NEUT%: 53.3 % (ref 39.0–75.0)
Platelets: 241 10*3/uL (ref 140–400)
RBC: 4.66 10*6/uL (ref 4.20–5.82)
RDW: 13.2 % (ref 11.0–14.6)
WBC: 6.3 10*3/uL (ref 4.0–10.3)
lymph#: 1.9 10*3/uL (ref 0.9–3.3)

## 2013-01-13 LAB — FERRITIN: Ferritin: 54 ng/mL (ref 22–322)

## 2013-01-13 NOTE — Patient Instructions (Signed)

## 2013-01-19 ENCOUNTER — Telehealth: Payer: Self-pay | Admitting: *Deleted

## 2013-01-19 NOTE — Telephone Encounter (Signed)
Spoke with partner as they were  Driving in the car.  Let them know that ferritin is OK at 54.  He appreciated the call

## 2013-01-19 NOTE — Telephone Encounter (Signed)
Message copied by Orbie Hurst on Thu Jan 19, 2013  5:02 PM ------      Message from: Levert Feinstein      Created: Thu Jan 19, 2013 10:26 AM       Call pt - ferritin OK at 54 ------

## 2013-03-10 ENCOUNTER — Other Ambulatory Visit: Payer: Self-pay

## 2013-03-10 ENCOUNTER — Telehealth: Payer: Self-pay | Admitting: Family Medicine

## 2013-03-10 DIAGNOSIS — I10 Essential (primary) hypertension: Secondary | ICD-10-CM

## 2013-03-10 MED ORDER — RAMIPRIL 5 MG PO CAPS: 5.0000 mg | ORAL_CAPSULE | Freq: Every day | ORAL | Status: DC

## 2013-03-10 NOTE — Telephone Encounter (Signed)
Pt called and stated that at his cpe in January he asked you about filling his bp meds. He says his cardio was filling it but told him his pcp should really be filling it. Pt needs a refill on ramipril sent into [facility] at  [address].

## 2013-03-11 MED ORDER — RAMIPRIL 5 MG PO CAPS: 5.0000 mg | ORAL_CAPSULE | Freq: Every day | ORAL | Status: DC

## 2013-03-28 ENCOUNTER — Encounter: Payer: Self-pay | Admitting: Family Medicine

## 2013-03-28 ENCOUNTER — Ambulatory Visit (INDEPENDENT_AMBULATORY_CARE_PROVIDER_SITE_OTHER): Payer: PRIVATE HEALTH INSURANCE | Admitting: Family Medicine

## 2013-03-28 VITALS — BP 120/70 | HR 86 | Wt 216.0 lb

## 2013-03-28 DIAGNOSIS — R109 Unspecified abdominal pain: Secondary | ICD-10-CM

## 2013-03-28 MED ORDER — ESOMEPRAZOLE MAGNESIUM 40 MG PO CPDR: 40.0000 mg | DELAYED_RELEASE_CAPSULE | Freq: Every day | ORAL | Status: DC

## 2013-03-28 NOTE — Progress Notes (Signed)
  Subjective:    Patient ID: Jesse Mcclure, male    DOB: 1950-11-11, 62 y.o.   MRN: [id number]  HPI He complains of a one-day history of midepigastric pain. He ate breakfast this morning in roughly one half hour later developed some midepigastric discomfort with some nausea. He would wax and wane. He then ate lunch and again a half an hour later had symptoms again. He was seen by the company nurse and given some antacids which he states helped symptoms. He has a previous history of a Nissen fundoplication for treatment of hiatus hernia. His had known vomiting diarrhea fever or chills. He has been taking an anti-inflammatory for treatment of back pain. He is usually taking NSAID twice per day.   Review of Systems     Objective:   Physical Exam Alert and in no distress. Abdominal exam shows slight midepigastric discomfort but no masses or tenderness. Normal bowel sounds.       Assessment & Plan:  Abdominal pain - Plan: esomeprazole (NEXIUM) 40 MG capsule he is to take this for the next several days and let me know how he is doing. Some of his symptoms some spasm but I will treat with a PPI first.

## 2013-04-14 ENCOUNTER — Ambulatory Visit (HOSPITAL_BASED_OUTPATIENT_CLINIC_OR_DEPARTMENT_OTHER): Payer: PRIVATE HEALTH INSURANCE | Admitting: Oncology

## 2013-04-14 ENCOUNTER — Other Ambulatory Visit (HOSPITAL_BASED_OUTPATIENT_CLINIC_OR_DEPARTMENT_OTHER): Payer: PRIVATE HEALTH INSURANCE

## 2013-04-14 ENCOUNTER — Telehealth: Payer: Self-pay | Admitting: Oncology

## 2013-04-14 ENCOUNTER — Ambulatory Visit (HOSPITAL_BASED_OUTPATIENT_CLINIC_OR_DEPARTMENT_OTHER): Payer: PRIVATE HEALTH INSURANCE

## 2013-04-14 ENCOUNTER — Telehealth: Payer: Self-pay | Admitting: *Deleted

## 2013-04-14 LAB — COMPREHENSIVE METABOLIC PANEL (CC13)
ALT: 29 U/L (ref 0–55)
AST: 25 U/L (ref 5–34)
Albumin: 3.7 g/dL (ref 3.5–5.0)
Alkaline Phosphatase: 66 U/L (ref 40–150)
BUN: 21.2 mg/dL (ref 7.0–26.0)
CO2: 22 mEq/L (ref 22–29)
Calcium: 8.9 mg/dL (ref 8.4–10.4)
Chloride: 107 mEq/L (ref 98–109)
Creatinine: 0.9 mg/dL (ref 0.7–1.3)
Glucose: 104 mg/dl (ref 70–140)
Potassium: 3.9 mEq/L (ref 3.5–5.1)
Sodium: 140 mEq/L (ref 136–145)
Total Bilirubin: 0.91 mg/dL (ref 0.20–1.20)
Total Protein: 6.9 g/dL (ref 6.4–8.3)

## 2013-04-14 LAB — LACTATE DEHYDROGENASE (CC13): LDH: 226 U/L (ref 125–245)

## 2013-04-14 LAB — CBC WITH DIFFERENTIAL/PLATELET
BASO%: 1.1 % (ref 0.0–2.0)
Basophils Absolute: 0.1 10*3/uL (ref 0.0–0.1)
EOS%: 2.2 % (ref 0.0–7.0)
Eosinophils Absolute: 0.1 10*3/uL (ref 0.0–0.5)
HCT: 45.2 % (ref 38.4–49.9)
HGB: 15.6 g/dL (ref 13.0–17.1)
LYMPH%: 25.7 % (ref 14.0–49.0)
MCH: 33.3 pg (ref 27.2–33.4)
MCHC: 34.5 g/dL (ref 32.0–36.0)
MCV: 96.5 fL (ref 79.3–98.0)
MONO#: 0.8 10*3/uL (ref 0.1–0.9)
MONO%: 12.9 % (ref 0.0–14.0)
NEUT#: 3.8 10*3/uL (ref 1.5–6.5)
NEUT%: 58.1 % (ref 39.0–75.0)
Platelets: 217 10*3/uL (ref 140–400)
RBC: 4.68 10*6/uL (ref 4.20–5.82)
RDW: 12.5 % (ref 11.0–14.6)
WBC: 6.6 10*3/uL (ref 4.0–10.3)
lymph#: 1.7 10*3/uL (ref 0.9–3.3)

## 2013-04-14 LAB — FERRITIN CHCC: Ferritin: 46 ng/ml (ref 22–316)

## 2013-04-14 NOTE — Telephone Encounter (Signed)
Per staff message and POF I have scheduled appts.  JMW  

## 2013-04-14 NOTE — Telephone Encounter (Signed)
gv and printed appt sched and avs for pt....MW added tx   °

## 2013-04-14 NOTE — Progress Notes (Signed)
OFFICE PROGRESS NOTE  Interval history:   Jesse Mcclure is a 62 year old man positive for the Cys282Tyr hemochromatosis mutation. He is a homozygote. Initial diagnosis dates to 11/28/2008. Ferritin at that time was 459. He is on an 3 month phlebotomy program. Most recent phlebotomy was 01/13/2013. Ferritin on 01/13/2013 returned at 54.   He is seen today for scheduled followup. No interim illnesses or infections. He feels well. He has a good appetite and good energy level. He reports tolerating the phlebotomy procedures well.  Objective: Blood pressure 154/102, pulse 94, temperature 97 F (36.1 C), temperature source Oral, resp. rate 19, height 5\' 9"  (1.753 m), weight 215 lb 14.4 oz (97.932 kg).  No thrush or ulcerations. Lungs are clear. No wheezes or rales. Regular cardiac rhythm. Abdomen is soft and nontender. No organomegaly. Extremities without edema. Calves are soft and nontender.  Lab Results: Lab Results  Component Value Date   WBC 6.6 04/14/2013   HGB 15.6 04/14/2013   HCT 45.2 04/14/2013   MCV 96.5 04/14/2013   PLT 217 04/14/2013    Chemistry:    Chemistry      Component Value Date/Time   NA 140 04/14/2013 1335   NA 139 09/15/2011 1607   K 3.9 04/14/2013 1335   K 3.9 09/15/2011 1607   CL 106 09/15/2011 1607   CO2 22 04/14/2013 1335   CO2 25 09/15/2011 1607   BUN 21.2 04/14/2013 1335   BUN 20 09/15/2011 1607   CREATININE 0.9 04/14/2013 1335   CREATININE 0.83 09/15/2011 1607      Component Value Date/Time   CALCIUM 8.9 04/14/2013 1335   CALCIUM 8.7 09/15/2011 1607   ALKPHOS 66 04/14/2013 1335   ALKPHOS 68 09/15/2011 1607   AST 25 04/14/2013 1335   AST 26 09/15/2011 1607   ALT 29 04/14/2013 1335   ALT 28 09/15/2011 1607   BILITOT 0.91 04/14/2013 1335   BILITOT 0.6 09/15/2011 1607       Studies/Results: No results found.  Medications: I have reviewed the patient's current medications.  Assessment/Plan:  1. Hereditary hemochromatosis (homozygote for the Cys282Tyr  hemachromatosis mutation) with initial diagnosis 11/28/2008. Ferritin at diagnosis 459. He is on an every 3 month phlebotomy program. Most recent ferritin on 01/13/2013 was 54. 2. Hypertension. He continues ramipril.  Disposition-plan to continue every 3 month phlebotomy program with labs to include a CBC and ferritin. He will return for an office visit in one year. He knows to contact the office in the interim with any problems.  Plan reviewed with Dr. Cyndie Chime.  Lonna Cobb ANP/GNP-BC   CC Dr. Sharlot Gowda

## 2013-04-18 ENCOUNTER — Encounter: Payer: Self-pay | Admitting: Oncology

## 2013-04-18 NOTE — Progress Notes (Unsigned)
62 year old man with who is a homozygote for the C282Y hemachromatosis gene. He is on intermittent phlebotomy program currently every 3 months. He remains clinically stable. Most recent ferritin level on 01/13/2013 was 54. Repeat value today is 46. We will keep him on his current phlebotomy schedule. I saw him in conjunction with nurse practitioner Lonna Cobb who dictated a full note.

## 2013-06-14 ENCOUNTER — Encounter: Payer: Self-pay | Admitting: Family Medicine

## 2013-06-14 NOTE — Progress Notes (Signed)
  Subjective:    Patient ID: Jesse Mcclure, male    DOB: 1951-03-01, 62 y.o.   MRN: [id number]  HPI    Review of Systems     Objective:   Physical Exam        Assessment & Plan:  He has had recent blood work which did show a PSA of 4 and percent PSA placing him under 20% likelihood of prostate cancer. Explained this to him. We have decided to recheck these numbers in 6 months.

## 2013-06-15 ENCOUNTER — Encounter: Payer: Self-pay | Admitting: Family Medicine

## 2013-06-26 ENCOUNTER — Encounter: Payer: Self-pay | Admitting: Family Medicine

## 2013-06-26 ENCOUNTER — Ambulatory Visit (INDEPENDENT_AMBULATORY_CARE_PROVIDER_SITE_OTHER): Payer: PRIVATE HEALTH INSURANCE | Admitting: Family Medicine

## 2013-06-26 VITALS — BP 140/82 | HR 64 | Temp 98.3°F | Wt 210.0 lb

## 2013-06-26 DIAGNOSIS — J111 Influenza due to unidentified influenza virus with other respiratory manifestations: Secondary | ICD-10-CM

## 2013-06-26 DIAGNOSIS — R509 Fever, unspecified: Secondary | ICD-10-CM

## 2013-06-26 LAB — POCT INFLUENZA A/B
Influenza A, POC: NEGATIVE
Influenza B, POC: NEGATIVE

## 2013-06-26 NOTE — Progress Notes (Signed)
  Subjective:    Patient ID: Jesse Mcclure, male    DOB: 1951-08-11, 62 y.o.   MRN: [id number]  HPI He has a one-day history of fever, chills, malaise, myalgias with fatigue. No sore throat, earache, cough or congestion.   Review of Systems     Objective:   Physical Exam alert and in no distress. Tympanic membranes and canals are normal. Throat is clear. Tonsils are normal. Neck is supple without adenopathy or thyromegaly. Cardiac exam shows a regular sinus rhythm without murmurs or gallops. Lungs are clear to auscultation. Flu test is negative       Assessment & Plan:  Fever and chills - Plan: Influenza A/B  Flu syndrome - Plan: Influenza A/B  discussed the use of Tamiflu versus just an NSAID. Recommended Advil or Aleve on a regular basis. I will also give him a day or 2 off of work.

## 2013-07-14 ENCOUNTER — Ambulatory Visit (INDEPENDENT_AMBULATORY_CARE_PROVIDER_SITE_OTHER): Payer: PRIVATE HEALTH INSURANCE | Admitting: Family Medicine

## 2013-07-14 ENCOUNTER — Encounter: Payer: Self-pay | Admitting: Family Medicine

## 2013-07-14 ENCOUNTER — Ambulatory Visit (HOSPITAL_BASED_OUTPATIENT_CLINIC_OR_DEPARTMENT_OTHER): Payer: PRIVATE HEALTH INSURANCE

## 2013-07-14 ENCOUNTER — Other Ambulatory Visit (HOSPITAL_BASED_OUTPATIENT_CLINIC_OR_DEPARTMENT_OTHER): Payer: PRIVATE HEALTH INSURANCE | Admitting: Lab

## 2013-07-14 VITALS — BP 150/90 | HR 88 | Temp 98.1°F | Ht 68.0 in | Wt 209.0 lb

## 2013-07-14 DIAGNOSIS — I10 Essential (primary) hypertension: Secondary | ICD-10-CM

## 2013-07-14 DIAGNOSIS — R42 Dizziness and giddiness: Secondary | ICD-10-CM

## 2013-07-14 LAB — CBC WITH DIFFERENTIAL/PLATELET
BASO%: 1.1 % (ref 0.0–2.0)
Basophils Absolute: 0 10*3/uL (ref 0.0–0.1)
EOS%: 3.5 % (ref 0.0–7.0)
Eosinophils Absolute: 0.1 10*3/uL (ref 0.0–0.5)
HCT: 46 % (ref 38.4–49.9)
HGB: 15.2 g/dL (ref 13.0–17.1)
LYMPH%: 34.8 % (ref 14.0–49.0)
MCH: 31.7 pg (ref 27.2–33.4)
MCHC: 33 g/dL (ref 32.0–36.0)
MCV: 96.2 fL (ref 79.3–98.0)
MONO#: 0.4 10*3/uL (ref 0.1–0.9)
MONO%: 10.2 % (ref 0.0–14.0)
NEUT#: 2 10*3/uL (ref 1.5–6.5)
NEUT%: 50.4 % (ref 39.0–75.0)
Platelets: 206 10*3/uL (ref 140–400)
RBC: 4.78 10*6/uL (ref 4.20–5.82)
RDW: 12.9 % (ref 11.0–14.6)
WBC: 4 10*3/uL (ref 4.0–10.3)
lymph#: 1.4 10*3/uL (ref 0.9–3.3)

## 2013-07-14 LAB — FERRITIN CHCC: Ferritin: 30 ng/ml (ref 22–316)

## 2013-07-14 NOTE — Progress Notes (Signed)
Chief Complaint  Patient presents with  . Dizziness    has been exeriencing some sligh dizziness and vertigo over the last month that worsened this past Wed am, the next morning after he got his flu vaccine at work. Did not take his ramipril today.    He got his flu shot on 10/7, and woke up Wednesday feeling very dizzy, having vertigo.  He has had bouts of dizziness over the last few months, but very mild, nothing like episode 2 days ago.  The vertigo lasted all morning.  He saw the company nurse as soon as she arrived that morning, who gave him meclizine which helped a lot (took just the one pill).  He slept most of the day, felt better.  Went back to work yesterday and felt fine all day.  After going to the gym after work, he bent forward to get the keys out of his bag, and felt vertigo starting again.  He took another meclizine prior to dinner, and symptoms resolved by the time dinner was over.  He has recurrent symptoms when the medication wears off, when he leans forward.  Today he felt dizzy again after standing up after bending over to pick something up, so he took another tablet. Felt better within the hour. He states that the medication he has is supposed to last 24 hours (Motion sickness II 25mg , "less drowsy formula")--he states it was meclizine, but it clearly said just to take one every 24 hours. He denies any sedation from the medication.  Denies any lightheadedness or presyncope.  Denies any numbness, tingling, weakness, balance problems (other than during episode of vertigo), or any other neurologic symptoms.  Denies fevers, URI or allergy symptoms. Has been having a little congestion in the mornings since a recent trip to the mountains.  Denies hearing loss, ringing in ears (very slight today, in right ear).  His partner has Meniere's disease, and he is concerned that symptoms could get as severe as his.  Reviewed CBC and ferritin from earlier today, as well as labs from [facility] done  last month.  He had phlebotomy this morning for his hemachromatosis.  Past Medical History  Diagnosis Date  . Hemochromatosis   . Hyperlipidemia   . Polio   . Degenerative disc disease   . BPH (benign prostatic hypertrophy)   . Status post Nissen fundoplication (without gastrostomy tube) procedure     for hiatal hernia.  Marland Kitchen HTN (hypertension)   . Diverticulosis    Past Surgical History  Procedure Laterality Date  . Foot surgery    . Hiatal hernia repair    . Other surgical history      Cataract Surgery--Both eyes   History   Social History  . Marital Status: Single    Spouse Name: N/A    Number of Children: N/A  . Years of Education: N/A   Occupational History  . Not on file.   Social History Main Topics  . Smoking status: Never Smoker   . Smokeless tobacco: Never Used  . Alcohol Use: 2.0 oz/week    4 drink(s) per week     Comment: 3-4 drinks per week.   . Drug Use: No  . Sexual Activity: Not on file   Other Topics Concern  . Not on file   Social History Narrative  . No narrative on file     Current outpatient prescriptions:meclizine (ANTIVERT) 25 MG tablet, Take 25 mg by mouth daily., Disp: , Rfl: ;  ramipril (ALTACE)  5 MG capsule, Take 1 capsule (5 mg total) by mouth daily., Disp: 90 capsule, Rfl: 3;  ibuprofen (ADVIL,MOTRIN) 200 MG tablet, Take 800 mg by mouth every 8 (eight) hours as needed.  , Disp: , Rfl:  (hasn't taken ramipril yet today)  No Known Allergies  ROS:  Very slight headaches intermittently, not now.  Denies fevers, chills, nausea, vomiting, bowel changes, skin rashes, bleeding/bruising. Denies feeling lightheaded, syncope/presyncope. See HPI.  Denies cough, shortness of breath, chest pain, or other complaints.  PHYSICAL EXAM: BP 150/90  Pulse 88  Temp(Src) 98.1 F (36.7 C) (Oral)  Ht 5\' 8"  (1.727 m)  Wt 209 lb (94.802 kg)  BMI 31.79 kg/m2 See extended vitals for full orthostatics (normal seated, but high BP for standing and  lying) 144/92 on repeat by MD, left arm, sitting. Well developed, pleasant male in no distress HEENT:  PERRL, EOMI, conjunctiva clear.  TM's and EAC's normal. Nasal mucosa mildly edematous with some whitish yellow mucus.  Mucosa not erythematous.  Sinuses nontender.  OP clear. Neck: no lymphadenopathy, thyromegaly or carotid bruit Heart: regular rate and rhythm wtihout murmur Lungs: clear bilaterally Abdomen: soft, nontender Extremities: no edema Neuro: alert and oriented.  Cranial nerves 2-12 intact. DTR's 2+ and symmetric. Normal strength, sensation, gait.  Normal finger to nose, rapid alternating movements.  He had short-lived vertigo (with minimal nystagmus) when sitting up with head turned to the might; more severe symptoms but just as short-lived when sitting up with head turned to the left.  No dizziness when lying down with head in any position, or with sitting up with head looking straight head.  ASSESSMENT/PLAN:  Vertigo - most likely BPV, can't rule out viral etiology.  normal neuro exam  Hypertension - elevated today--hasn't taken his med yet today (and normally takes in morning)   Take your blood pressure medication when you get home (there is NO interaction with the meclizine and your blood pressure medication).  You seems to get dizzier when sitting up with head turned to your left.  Only mildly so with head turned to the right, and no dizziness when your head was straight forward.  This indicates a positional component to your vertigo.  This likely will resolve on its own, but if you continue to have a positional component that persists, especially if it worsens in severity, then sometimes physical therapy is needed to help resolve it. (they do certain maneuvers that work well).    Continue to use the meclizine as needed for vertigo.  Try and avoid the positions that bring about the dizziness.  Return for re-evaluation if you develop NEW neurologic symptoms--numbness,  tingling, weakness, confusion, speech problems, memory problems, falls.  The other possibility (based partly on the exam of your nose) is that you might be coming down with a virus that can also cause vertigo.  This should also self-resolve, and meclizine will help with symptoms.

## 2013-07-14 NOTE — Progress Notes (Signed)
Phlebotomy performed in right antecubital with 16G needle easily.  Approx. 534 gm of blood obtained and wasted.  Pt tolerated procedure without difficulty.  Refreshments given.  Pt was stable at discharge.

## 2013-07-14 NOTE — Patient Instructions (Signed)
Therapeutic Phlebotomy Therapeutic phlebotomy is the controlled removal of blood from your body for the purpose of treating a medical condition. It is similar to donating blood. Usually, about a pint (470 mL) of blood is removed. The average adult has 9 to 12 pints (4.3 to 5.7 L) of blood. Therapeutic phlebotomy may be used to treat the following medical conditions:  Hemochromatosis. This is a condition in which there is too much iron in the blood.  Polycythemia vera. This is a condition in which there are too many red cells in the blood.  Porphyria cutanea tarda. This is a disease usually passed from one generation to the next (inherited). It is a condition in which an important part of hemoglobin is not made properly. This results in the build up of abnormal amounts of porphyrins in the body.  Sickle cell disease. This is an inherited disease. It is a condition in which the red blood cells form an abnormal crescent shape rather than a round shape. LET YOUR CAREGIVER KNOW ABOUT:  Allergies.  Medicines taken including herbs, eyedrops, over-the-counter medicines, and creams.  Use of steroids (by mouth or creams).  Previous problems with anesthetics or numbing medicine.  History of blood clots.  History of bleeding or blood problems.  Previous surgery.  Possibility of pregnancy, if this applies. RISKS AND COMPLICATIONS This is a simple and safe procedure. Problems are unlikely. However, problems can occur and may include:  Nausea or lightheadedness.  Low blood pressure.  Soreness, bleeding, swelling, or bruising at the needle insertion site.  Infection. BEFORE THE PROCEDURE  This is a procedure that can be done as an outpatient. Confirm the time that you need to arrive for your procedure. Confirm whether there is a need to fast or withhold any medications. It is helpful to wear clothing with sleeves that can be raised above the elbow. A blood sample may be done to determine the  amount of red blood cells or iron in your blood. Plan ahead of time to have someone drive you home after the procedure. PROCEDURE The entire procedure from preparation through recovery takes about 1 hour. The actual collection takes about 10 to 15 minutes.  A needle will be inserted into your vein.  Tubing and a collection bag will be attached to that needle.  Blood will flow through the needle and tubing into the collection bag.  You may be asked to open and close your hand slowly and continuously during the entire collection.  Once the specified amount of blood has been removed from your body, the collection bag and tubing will be clamped.  The needle will be removed.  Pressure will be held on the site of the needle insertion to stop the bleeding. Then a bandage will be placed over the needle insertion site. AFTER THE PROCEDURE  Your recovery will be assessed and monitored. If there are no problems, as an outpatient, you should be able to go home shortly after the procedure.  Document Released: 02/23/2011 Document Revised: 12/14/2011 Document Reviewed: 02/23/2011 [employer] Patient Information 2014 [employer].  

## 2013-07-14 NOTE — Patient Instructions (Signed)
Take your blood pressure medication when you get home (there is NO interaction with the meclizine and your blood pressure medication).  You seems to get dizzier when sitting up with head turned to your left.  Only mildly so with head turned to the right, and no dizziness when your head was straight forward.  This indicates a positional component to your vertigo.  This likely will resolve on its own, but if you continue to have a positional component that persists, especially if it worsens in severity, then sometimes physical therapy is needed to help resolve it. (they do certain maneuvers that work well).    Continue to use the meclizine as needed for vertigo.  Try and avoid the positions that bring about the dizziness.  Return for re-evaluation if you develop NEW neurologic symptoms--numbness, tingling, weakness, confusion, speech problems, memory problems, falls.  The other possibility (based partly on the exam of your nose) is that you might be coming down with a virus that can also cause vertigo.  This should also self-resolve, and meclizine will help with symptoms.  Benign Positional Vertigo Vertigo means you feel like you or your surroundings are moving when they are not. Benign positional vertigo is the most common form of vertigo. Benign means that the cause of your condition is not serious. Benign positional vertigo is more common in older adults. CAUSES  Benign positional vertigo is the result of an upset in the labyrinth system. This is an area in the middle ear that helps control your balance. This may be caused by a viral infection, head injury, or repetitive motion. However, often no specific cause is found. SYMPTOMS  Symptoms of benign positional vertigo occur when you move your head or eyes in different directions. Some of the symptoms may include:  Loss of balance and falls.  Vomiting.  Blurred vision.  Dizziness.  Nausea.  Involuntary eye movements (nystagmus). DIAGNOSIS   Benign positional vertigo is usually diagnosed by physical exam. If the specific cause of your benign positional vertigo is unknown, your caregiver may perform imaging tests, such as magnetic resonance imaging (MRI) or computed tomography (CT). TREATMENT  Your caregiver may recommend movements or procedures to correct the benign positional vertigo. Medicines such as meclizine, benzodiazepines, and medicines for nausea may be used to treat your symptoms. In rare cases, if your symptoms are caused by certain conditions that affect the inner ear, you may need surgery. HOME CARE INSTRUCTIONS   Follow your caregiver's instructions.  Move slowly. Do not make sudden body or head movements.  Avoid driving.  Avoid operating heavy machinery.  Avoid performing any tasks that would be dangerous to you or others during a vertigo episode.  Drink enough fluids to keep your urine clear or pale yellow. SEEK IMMEDIATE MEDICAL CARE IF:   You develop problems with walking, weakness, numbness, or using your arms, hands, or legs.  You have difficulty speaking.  You develop severe headaches.  Your nausea or vomiting continues or gets worse.  You develop visual changes.  Your family or friends notice any behavioral changes.  Your condition gets worse.  You have a fever.  You develop a stiff neck or sensitivity to light. MAKE SURE YOU:   Understand these instructions.  Will watch your condition.  Will get help right away if you are not doing well or get worse. Document Released: 06/29/2006 Document Revised: 12/14/2011 Document Reviewed: 06/11/2011 [facility] Patient Information 2014 [place], [place].

## 2013-09-19 ENCOUNTER — Ambulatory Visit (INDEPENDENT_AMBULATORY_CARE_PROVIDER_SITE_OTHER): Payer: PRIVATE HEALTH INSURANCE | Admitting: Medical

## 2013-09-19 ENCOUNTER — Encounter: Payer: Self-pay | Admitting: Medical

## 2013-09-19 VITALS — BP 142/80 | HR 86 | Temp 98.1°F | Resp 16 | Wt 218.0 lb

## 2013-09-19 DIAGNOSIS — S39012A Strain of muscle, fascia and tendon of lower back, initial encounter: Secondary | ICD-10-CM

## 2013-09-19 DIAGNOSIS — M5380 Other specified dorsopathies, site unspecified: Secondary | ICD-10-CM

## 2013-09-19 DIAGNOSIS — IMO0002 Reserved for concepts with insufficient information to code with codable children: Secondary | ICD-10-CM

## 2013-09-19 DIAGNOSIS — M62830 Muscle spasm of back: Secondary | ICD-10-CM

## 2013-09-19 MED ORDER — CYCLOBENZAPRINE HCL 10 MG PO TABS: ORAL_TABLET | ORAL | Status: DC

## 2013-09-19 NOTE — Progress Notes (Signed)
  Subjective:    Jesse Mcclure is a 62 y.o. male who presents for evaluation of low back pain. The patient does have hx/o DDD of L spine, sees Dr. Charlett Blake at [facility] orth[facility]ics. Symptoms have been present for 3 days and are unchanged.  Onset was related to / precipitated by no known injury and standing from a seated position and turning, felt a pull in his low back. The pain is located in the left lumbar area and does not radiate. The pain is described as aching, soreness and stiffness and occurs intermittently.  Symptoms are exacerbated by extension, flexion, standing and twisting. Symptoms are improved by heat and NSAIDs, rest. He has no other symptoms associated with the back pain. The patient has no "red flag" history indicative of complicated back pain.  The following portions of the patient's history were reviewed and updated as appropriate: allergies, current medications, past family history, past medical history, past social history, past surgical history and problem list.  Review of Systems Constitutional: denies fever, chills, sweats, unexpected weight change, anorexia, fatigue Dermatology: rash, bruising, redness Cardiology: denies chest pain, palpitations, edema, orthopnea, paroxysmal nocturnal dyspnea Respiratory: denies cough, shortness of breath, dyspnea on exertion, wheezing, hemoptysis Gastroenterology: denies abdominal pain, nausea, vomiting, diarrhea, constipation, blood in stool, changes in bowel movement, dysphagia Musculoskeletal: denies arthralgias, myalgias, joint swelling, neck pain, cramping Urology: denies dysuria, difficulty urinating, hematuria, urinary frequency, urgency, incontinence Neurology: no headache, weakness, tingling, numbness, falls, dizziness      Objective:    Filed Vitals:   09/19/13 1106  BP: 142/80  Pulse: 86  Temp: 98.1 F (36.7 C)  Resp: 16    General appearance: alert, no distress, WD/WN, male Neck: supple, no lymphadenopathy, no  thyromegaly, no masses, normal ROM Chest: non tender, normal shape and expansion Abdomen: +bs, soft, non tender, non distended, no masses, no hepatomegaly, no splenomegaly, no bruits Back: left lumbar area paraspinal tenderness and positive spasm, pain with flexion and extension range of motion, otherwise back unremarkable MSK: UE nontender, normal ROM, LE nontender, normal ROM, no obvious deformity Extremities: no edema, no cyanosis, no clubbing Pulses: 2+ symmetric, upper and lower extremities     Assessment:   Encounter Diagnoses  Name Primary?  . Back strain, initial encounter Yes  . Muscle spasm of back       Plan:   Etiology appears to be strain/spasm.  No red flag issues today or other worrisome etiology. Natural history and expected course discussed. Questions answered. Stretching exercises discussed. Regular aerobic and trunk strengthening exercises discussed. Short (3-4 day) period of relative rest recommended until acute symptoms improve. Heat to affected area as needed for local pain relief.  Medications prescribed: NSAIDs - OTC Ibupofen q8hr prn x 4-5 days Muscle relaxants per medication orders - Flexeril QHS prn x 4-5 days  Follow up: prn or if not improving

## 2013-10-06 ENCOUNTER — Ambulatory Visit (HOSPITAL_BASED_OUTPATIENT_CLINIC_OR_DEPARTMENT_OTHER): Payer: PRIVATE HEALTH INSURANCE

## 2013-10-06 ENCOUNTER — Other Ambulatory Visit (HOSPITAL_BASED_OUTPATIENT_CLINIC_OR_DEPARTMENT_OTHER): Payer: PRIVATE HEALTH INSURANCE

## 2013-10-06 LAB — CBC WITH DIFFERENTIAL/PLATELET
BASO%: 1 % (ref 0.0–2.0)
Basophils Absolute: 0.1 10*3/uL (ref 0.0–0.1)
EOS%: 3.1 % (ref 0.0–7.0)
Eosinophils Absolute: 0.2 10*3/uL (ref 0.0–0.5)
HCT: 41.1 % (ref 38.4–49.9)
HGB: 14 g/dL (ref 13.0–17.1)
LYMPH%: 31.6 % (ref 14.0–49.0)
MCH: 32.3 pg (ref 27.2–33.4)
MCHC: 34 g/dL (ref 32.0–36.0)
MCV: 95 fL (ref 79.3–98.0)
MONO#: 0.7 10*3/uL (ref 0.1–0.9)
MONO%: 12.5 % (ref 0.0–14.0)
NEUT#: 2.8 10*3/uL (ref 1.5–6.5)
NEUT%: 51.8 % (ref 39.0–75.0)
Platelets: 218 10*3/uL (ref 140–400)
RBC: 4.32 10*6/uL (ref 4.20–5.82)
RDW: 13.4 % (ref 11.0–14.6)
WBC: 5.5 10*3/uL (ref 4.0–10.3)
lymph#: 1.7 10*3/uL (ref 0.9–3.3)

## 2013-10-06 LAB — FERRITIN CHCC: Ferritin: 14 ng/ml — ABNORMAL LOW (ref 22–316)

## 2013-10-06 NOTE — Patient Instructions (Signed)

## 2013-10-06 NOTE — Progress Notes (Signed)
Therapeutic phlebotomy completed on LAC using 16 g phlebotomy set. Start [phone], 500g removed. Pt tolerated well. Snack taken. 30 minute observation.

## 2013-10-09 ENCOUNTER — Other Ambulatory Visit: Payer: Self-pay | Admitting: Oncology

## 2013-10-10 ENCOUNTER — Telehealth: Payer: Self-pay | Admitting: Hematology and Oncology

## 2013-10-10 ENCOUNTER — Telehealth: Payer: Self-pay | Admitting: Oncology

## 2013-10-10 NOTE — Telephone Encounter (Signed)
lvm for pt regarding to April and July appt with provider and time change for July appt. Mailed pt appt sched/avs and letter °

## 2013-10-10 NOTE — Telephone Encounter (Signed)
lvm for pt regarding to April and July appt with provider and time change for July appt. Mailed pt appt sched/avs and letter

## 2013-10-11 ENCOUNTER — Telehealth: Payer: Self-pay | Admitting: *Deleted

## 2013-10-11 NOTE — Telephone Encounter (Signed)
Message copied by Ignacia Felling on Wed Oct 11, 2013 11:01 AM ------      Message from: Jesse Mcclure      Created: Mon Oct 09, 2013  7:02 PM       Call pt - lab stable; continue phlebotomy program ------

## 2013-10-11 NOTE — Telephone Encounter (Signed)
Spoke with patient.  Let him know that his lab is stable and Dr. Beryle Beams wants him to continue phlebotomy program.

## 2013-12-02 ENCOUNTER — Encounter: Payer: Self-pay | Admitting: Oncology

## 2013-12-15 ENCOUNTER — Encounter: Payer: Self-pay | Admitting: Family Medicine

## 2014-01-04 ENCOUNTER — Encounter: Payer: Self-pay | Admitting: Family Medicine

## 2014-01-04 ENCOUNTER — Ambulatory Visit (INDEPENDENT_AMBULATORY_CARE_PROVIDER_SITE_OTHER): Payer: PRIVATE HEALTH INSURANCE | Admitting: Family Medicine

## 2014-01-04 VITALS — BP 130/80 | HR 88 | Temp 98.3°F | Ht 69.0 in | Wt 215.0 lb

## 2014-01-04 DIAGNOSIS — J069 Acute upper respiratory infection, unspecified: Secondary | ICD-10-CM

## 2014-01-04 NOTE — Patient Instructions (Signed)
Drink plenty of fluids. Continue to use ibuprofen as needed for pain and/or fever. Consider sinus rinses (sinus rinse kit or neti-pot) rather than using decongestants which can raise blood pressure. Your blood pressure was ok today, so you can use the claritin-D as long as your monitor your blood pressure.  Or you can use plain claritin and not worry about the blood pressure. I recommend using Mucinex--either the plain or the DM.  The DM version has a cough suppressant.  Call or return if your symptoms persist or worsen--expect to be sick for at least a week, but by a week you should be improving, rather than worsening.

## 2014-01-04 NOTE — Progress Notes (Signed)
Chief Complaint  Patient presents with  . Cough    thought he had some allergies earlier this week-took some Claritin D thinks it made him have coughing fits last night, has not taken any more. Has HA today, still coughing and still feels congestion.    Earlier this week he had some burning in his sinuses and scratchy throat.  It then progressed into cough, increased head congestion, ongoing scratchy throat.  He has no h/o allergies, but thought that was what it was, so he took a dose of Claritin-D last night. It helped some, but he coughed a lot last night, wasn't sure if it was related to the medication.  He took 2 ibuprofen at 10 this morning, and that made him feel a lot better.  He hasn't had fevers (felt feverish yesterday, but PA at work checked and temp was normal).  He has been coughing up phlegm, but hasn't looked at it.  Nasal mucus is clear.   He feels just very slightly short of breath.    He has been having pain in his back and his head due to coughing; ibuprofen helped with the pain.  No known sick contacts.  Past Medical History  Diagnosis Date  . Hemochromatosis   . Hyperlipidemia   . Polio   . Degenerative disc disease   . BPH (benign prostatic hypertrophy)   . Status post Nissen fundoplication (without gastrostomy tube) procedure     for hiatal hernia.  Marland Kitchen HTN (hypertension)   . Diverticulosis    Past Surgical History  Procedure Laterality Date  . Foot surgery    . Hiatal hernia repair    . Other surgical history      Cataract Surgery--Both eyes   History   Social History  . Marital Status: Single    Spouse Name: N/A    Number of Children: N/A  . Years of Education: N/A   Occupational History  . Not on file.   Social History Main Topics  . Smoking status: Never Smoker   . Smokeless tobacco: Never Used  . Alcohol Use: 2.0 oz/week    4 drink(s) per week     Comment: 3-4 drinks per week.   . Drug Use: No  . Sexual Activity: Not on file   Other Topics  Concern  . Not on file   Social History Narrative  . No narrative on file    Outpatient Encounter Prescriptions as of 01/04/2014  Medication Sig Note  . loratadine-pseudoephedrine (CLARITIN-D 12-HOUR) 5-120 MG per tablet Take 1 tablet by mouth 2 (two) times daily. 01/04/2014: Took 1 dose at 5 pm yesterday  . ramipril (ALTACE) 5 MG capsule Take 1 capsule (5 mg total) by mouth daily.   Marland Kitchen ibuprofen (ADVIL,MOTRIN) 200 MG tablet Take 400 mg by mouth every 8 (eight) hours as needed.  01/04/2014: Took 2 earlier today  . meclizine (ANTIVERT) 25 MG tablet Take 25 mg by mouth daily. 09/19/2013: Prn   . [DISCONTINUED] cyclobenzaprine (FLEXERIL) 10 MG tablet 1 tablet po QHS, or up to BID prn    No Known Allergies  ROS:  Denies nausea, vomiting, diarrhea, chest pain, palpitations, myalgias/arthralgias, bleeding, bruising, rash.  See HPI  PHYSICAL EXAM: BP 130/80  Pulse 88  Temp(Src) 98.3 F (36.8 C) (Oral)  Ht 5\' 9"  (1.753 m)  Wt 215 lb (97.523 kg)  BMI 31.74 kg/m2 Well developed, pleasant male in no distress HEENT:  PERRL, EOMI, conjunctiva clear.  TM's and EAC's normal.  White mucus  noted in back of throat. No erythema Nasal mucosa is moderately edematous, some erythema and thick white mucus. Sinuses nontender Neck: no lymphadenopathy or mass Heart: regular rate and rhythm Lungs are clear bilaterally without murmur, rub, gallop Skin : no rash/lesions Psych: normal mood, affect Neuro: grossly intact  ASSESSMENT/PLAN:  Acute upper respiratory infections of unspecified site   Supportive measures reviewed. Use decongestants with caution due to HTN Encouraged sinus rinses, antihistamines prn, mucinex, and continue tyl/ibu prn pain/headache Reviewed s/sx of bacterial infection, and to return/call if symptoms persist/worsen

## 2014-01-05 ENCOUNTER — Other Ambulatory Visit: Payer: PRIVATE HEALTH INSURANCE

## 2014-01-11 ENCOUNTER — Other Ambulatory Visit: Payer: Self-pay

## 2014-01-12 ENCOUNTER — Ambulatory Visit (HOSPITAL_BASED_OUTPATIENT_CLINIC_OR_DEPARTMENT_OTHER): Payer: PRIVATE HEALTH INSURANCE

## 2014-01-12 ENCOUNTER — Other Ambulatory Visit (HOSPITAL_BASED_OUTPATIENT_CLINIC_OR_DEPARTMENT_OTHER): Payer: PRIVATE HEALTH INSURANCE

## 2014-01-12 LAB — CBC WITH DIFFERENTIAL/PLATELET
BASO%: 1.2 % (ref 0.0–2.0)
Basophils Absolute: 0.1 10*3/uL (ref 0.0–0.1)
EOS%: 3.4 % (ref 0.0–7.0)
Eosinophils Absolute: 0.1 10*3/uL (ref 0.0–0.5)
HCT: 44.6 % (ref 38.4–49.9)
HGB: 14.4 g/dL (ref 13.0–17.1)
LYMPH%: 30.4 % (ref 14.0–49.0)
MCH: 28.9 pg (ref 27.2–33.4)
MCHC: 32.2 g/dL (ref 32.0–36.0)
MCV: 89.7 fL (ref 79.3–98.0)
MONO#: 0.4 10*3/uL (ref 0.1–0.9)
MONO%: 9.6 % (ref 0.0–14.0)
NEUT#: 2.4 10*3/uL (ref 1.5–6.5)
NEUT%: 55.4 % (ref 39.0–75.0)
Platelets: 222 10*3/uL (ref 140–400)
RBC: 4.97 10*6/uL (ref 4.20–5.82)
RDW: 15.3 % — ABNORMAL HIGH (ref 11.0–14.6)
WBC: 4.3 10*3/uL (ref 4.0–10.3)
lymph#: 1.3 10*3/uL (ref 0.9–3.3)

## 2014-01-12 LAB — FERRITIN CHCC: Ferritin: 18 ng/ml — ABNORMAL LOW (ref 22–316)

## 2014-01-12 NOTE — Patient Instructions (Signed)

## 2014-01-12 NOTE — Progress Notes (Signed)
Phlebotomy performed via right antecubital, 500 cc blood removed without problems. Drink and snack offered and patient observed for 30 minutes post procedure.

## 2014-01-17 ENCOUNTER — Telehealth: Payer: Self-pay | Admitting: *Deleted

## 2014-01-17 NOTE — Telephone Encounter (Signed)
Received call back from pt & informed of stable labs & to cont q 3 mo phlebotomy & to see Dr Alvy Bimler in July.  He reports that this is scheduled but he needs to r/s for a different day & will call back to do this.  He did not have any further questions.

## 2014-01-17 NOTE — Telephone Encounter (Signed)
Message copied by Jesse Fall on Wed Jan 17, 2014 12:52 PM ------      Message from: Annia Belt      Created: Mon Jan 15, 2014  8:10 AM       Call pt: lab stable; continue Q 3 mos phlebotomy.  He sees Dr Alvy Bimler in       July ------

## 2014-01-24 ENCOUNTER — Telehealth: Payer: Self-pay | Admitting: Hematology and Oncology

## 2014-01-24 NOTE — Telephone Encounter (Signed)
Pt called r/s lab,md and phlebotomy to 04/05/14 emailed Sharyn Lull regarding phlebotomy

## 2014-01-25 ENCOUNTER — Telehealth: Payer: Self-pay | Admitting: *Deleted

## 2014-01-25 NOTE — Telephone Encounter (Signed)
Per staff message and POF I have scheduled appts.  JMW  

## 2014-01-30 ENCOUNTER — Telehealth: Payer: Self-pay | Admitting: Hematology and Oncology

## 2014-01-30 NOTE — Telephone Encounter (Signed)
Called pt and left message regarding phlebotomy,lab and MD

## 2014-04-05 ENCOUNTER — Ambulatory Visit (HOSPITAL_BASED_OUTPATIENT_CLINIC_OR_DEPARTMENT_OTHER): Payer: PRIVATE HEALTH INSURANCE | Admitting: Hematology and Oncology

## 2014-04-05 ENCOUNTER — Encounter: Payer: Self-pay | Admitting: Hematology and Oncology

## 2014-04-05 ENCOUNTER — Other Ambulatory Visit (HOSPITAL_BASED_OUTPATIENT_CLINIC_OR_DEPARTMENT_OTHER): Payer: PRIVATE HEALTH INSURANCE

## 2014-04-05 DIAGNOSIS — I10 Essential (primary) hypertension: Secondary | ICD-10-CM

## 2014-04-05 LAB — CBC WITH DIFFERENTIAL/PLATELET
BASO%: 1.2 % (ref 0.0–2.0)
Basophils Absolute: 0.1 10*3/uL (ref 0.0–0.1)
EOS%: 2.5 % (ref 0.0–7.0)
Eosinophils Absolute: 0.1 10*3/uL (ref 0.0–0.5)
HCT: 43.5 % (ref 38.4–49.9)
HGB: 14.3 g/dL (ref 13.0–17.1)
LYMPH%: 29.7 % (ref 14.0–49.0)
MCH: 30 pg (ref 27.2–33.4)
MCHC: 32.9 g/dL (ref 32.0–36.0)
MCV: 91.1 fL (ref 79.3–98.0)
MONO#: 0.6 10*3/uL (ref 0.1–0.9)
MONO%: 12.1 % (ref 0.0–14.0)
NEUT#: 2.5 10*3/uL (ref 1.5–6.5)
NEUT%: 54.5 % (ref 39.0–75.0)
Platelets: 217 10*3/uL (ref 140–400)
RBC: 4.77 10*6/uL (ref 4.20–5.82)
RDW: 16.2 % — ABNORMAL HIGH (ref 11.0–14.6)
WBC: 4.6 10*3/uL (ref 4.0–10.3)
lymph#: 1.4 10*3/uL (ref 0.9–3.3)

## 2014-04-05 LAB — COMPREHENSIVE METABOLIC PANEL (CC13)
ALT: 30 U/L (ref 0–55)
AST: 26 U/L (ref 5–34)
Albumin: 3.8 g/dL (ref 3.5–5.0)
Alkaline Phosphatase: 75 U/L (ref 40–150)
Anion Gap: 9 mEq/L (ref 3–11)
BUN: 16.8 mg/dL (ref 7.0–26.0)
CO2: 23 mEq/L (ref 22–29)
Calcium: 8.7 mg/dL (ref 8.4–10.4)
Chloride: 107 mEq/L (ref 98–109)
Creatinine: 0.9 mg/dL (ref 0.7–1.3)
Glucose: 109 mg/dl (ref 70–140)
Potassium: 4.4 mEq/L (ref 3.5–5.1)
Sodium: 139 mEq/L (ref 136–145)
Total Bilirubin: 0.71 mg/dL (ref 0.20–1.20)
Total Protein: 6.7 g/dL (ref 6.4–8.3)

## 2014-04-05 LAB — FERRITIN CHCC: Ferritin: 16 ng/ml — ABNORMAL LOW (ref 22–316)

## 2014-04-05 NOTE — Progress Notes (Signed)
[place] FOLLOW-UP progress notes  Patient Care Team: Denita Lung, MD as PCP - General (Family Medicine) Heath Lark, MD as Consulting Physician (Hematology and Oncology)  CHIEF COMPLAINTS/PURPOSE OF VISIT:  Hemachromatosis  HISTORY OF PRESENTING ILLNESS:  Jesse Mcclure 63 y.o. male was transferred to my care after his prior physician has left.  I reviewed the patient's records extensive and collaborated the history with the patient. 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 He is a homozygote. Initial diagnosis dates to 11/28/2008. Ferritin at that time was 459. He is on an 3 month phlebotomy program.   He is seen today for scheduled followup. No interim illnesses or infections. He feels well. He has a good appetite and good energy level. He reports tolerating the phlebotomy procedures well. He does complain of leg cramps.  MEDICAL HISTORY:  Past Medical History  Diagnosis Date  . Hemochromatosis   . Hyperlipidemia   . Polio   . Degenerative disc disease   . BPH (benign prostatic hypertrophy)   . Status post Nissen fundoplication (without gastrostomy tube) procedure     for hiatal hernia.  Marland Kitchen HTN (hypertension)   . Diverticulosis     SURGICAL HISTORY: Past Surgical History  Procedure Laterality Date  . Foot surgery    . Hiatal hernia repair    . Other surgical history      Cataract Surgery--Both eyes    SOCIAL HISTORY: History   Social History  . Marital Status: Single    Spouse Name: N/A    Number of Children: N/A  . Years of Education: N/A   Occupational History  . Not on file.   Social History Main Topics  . Smoking status: Never Smoker   . Smokeless tobacco: Never Used  . Alcohol Use: 2.0 oz/week    4 drink(s) per week     Comment: 3-4 drinks per week.   . Drug Use: No  . Sexual Activity: Not on file   Other Topics Concern  . Not on file   Social History  Narrative  . No narrative on file    FAMILY HISTORY: Family History  Problem Relation Age of Onset  . Breast cancer Mother   . Breast cancer Sister     ALLERGIES:  has No Known Allergies.  MEDICATIONS:  Current Outpatient Prescriptions  Medication Sig Dispense Refill  . ibuprofen (ADVIL,MOTRIN) 200 MG tablet Take 400 mg by mouth every 8 (eight) hours as needed.       . ramipril (ALTACE) 5 MG capsule Take 5 mg by mouth daily.       No current facility-administered medications for this visit.    REVIEW OF SYSTEMS:   Constitutional: Denies fevers, chills or abnormal night sweats Eyes: Denies blurriness of vision, double vision or watery eyes Ears, nose, mouth, throat, and face: Denies mucositis or sore throat Respiratory: Denies cough, dyspnea or wheezes Cardiovascular: Denies palpitation, chest discomfort or lower extremity swelling Gastrointestinal:  Denies nausea, heartburn or change in bowel habits Skin: Denies abnormal skin rashes Lymphatics: Denies new lymphadenopathy or easy bruising Neurological:Denies numbness, tingling or new weaknesses Behavioral/Psych: Mood is stable, no new changes  All other systems were reviewed with the patient and are negative.  PHYSICAL EXAMINATION: ECOG PERFORMANCE STATUS: 0 - Asymptomatic  Filed Vitals:   04/05/14 0905  BP: 161/98  Pulse: 88  Temp: 97.9 F (36.6 C)  Resp: 18   Filed Weights  04/05/14 0905  Weight: 219 lb (99.338 kg)    GENERAL:alert, no distress and comfortable SKIN: skin color, texture, turgor are normal, no rashes or significant lesions EYES: normal, conjunctiva are pink and non-injected, sclera clear OROPHARYNX:no exudate, normal lips, buccal mucosa, and tongue  NECK: supple, thyroid normal size, non-tender, without nodularity LYMPH:  no palpable lymphadenopathy in the cervical, axillary or inguinal LUNGS: clear to auscultation and percussion with normal breathing effort HEART: regular rate & rhythm and  no murmurs without lower extremity edema ABDOMEN:abdomen soft, non-tender and normal bowel sounds Musculoskeletal:no cyanosis of digits and no clubbing  PSYCH: alert & oriented x 3 with fluent speech NEURO: no focal motor/sensory deficits  LABORATORY DATA:  I have reviewed the data as listed Lab Results  Component Value Date   WBC 4.6 04/05/2014   HGB 14.3 04/05/2014   HCT 43.5 04/05/2014   MCV 91.1 04/05/2014   PLT 217 04/05/2014    Recent Labs  04/14/13 1335 04/05/14 0849  NA 140 139  K 3.9 4.4  CO2 22 23  GLUCOSE 104 109  BUN 21.2 16.8  CREATININE 0.9 0.9  CALCIUM 8.9 8.7  PROT 6.9 6.7  ALBUMIN 3.7 3.8  AST 25 26  ALT 29 30  ALKPHOS 66 75  BILITOT 0.91 0.71    RADIOGRAPHIC STUDIES: I have personally reviewed the radiological images as listed and agreed with the findings in the report. No results found.  ASSESSMENT & PLAN:  Hemochromatosis, hereditary The patient had numerous phlebotomy sessions. He's starting to get symptoms of iron deficiency with leg cramps.. I recommend relaxing the rule and only do phlebotomy twice a year and with the goal to get ferritin lower than 50 only. He agreed.  Hypertension His blood pressure is not under good control. The patient will make appointment to see primary care provider for medication adjustment.    Orders Placed This Encounter  Procedures  . CBC with Differential    Standing Status: Future     Number of Occurrences:      Standing Expiration Date: 04/05/2015  . Ferritin    Standing Status: Future     Number of Occurrences:      Standing Expiration Date: 04/05/2015    All questions were answered. The patient knows to call the clinic with any problems, questions or concerns. I spent 15 minutes counseling the patient face to face. The total time spent in the appointment was 20 minutes and more than 50% was on counseling.     K[place]al, McCreary, MD 04/05/2014 9:57 AM

## 2014-04-05 NOTE — Assessment & Plan Note (Signed)
The patient had numerous phlebotomy sessions. He's starting to get symptoms of iron deficiency with leg cramps.. I recommend relaxing the rule and only do phlebotomy twice a year and with the goal to get ferritin lower than 50 only. He agreed.

## 2014-04-05 NOTE — Assessment & Plan Note (Signed)
His blood pressure is not under good control. The patient will make appointment to see primary care provider for medication adjustment.

## 2014-04-06 ENCOUNTER — Ambulatory Visit: Payer: PRIVATE HEALTH INSURANCE | Admitting: Hematology and Oncology

## 2014-04-06 ENCOUNTER — Telehealth: Payer: Self-pay | Admitting: Hematology and Oncology

## 2014-04-06 ENCOUNTER — Other Ambulatory Visit: Payer: PRIVATE HEALTH INSURANCE

## 2014-04-06 ENCOUNTER — Ambulatory Visit: Payer: PRIVATE HEALTH INSURANCE | Admitting: Oncology

## 2014-04-06 NOTE — Telephone Encounter (Signed)
Talked to  pt gave him   appt for lab and Md for January 2016 and July 2016

## 2014-04-10 LAB — CBC AND DIFFERENTIAL
HCT: 44 % (ref 41–53)
Hemoglobin: 14.3 g/dL (ref 13.5–17.5)
Neutrophils Absolute: 4 /uL
Platelets: 242 10*3/uL (ref 150–399)
WBC: 6.9 10^3/mL

## 2014-04-10 LAB — BASIC METABOLIC PANEL
BUN: 19 mg/dL (ref 4–21)
Creatinine: 1 mg/dL (ref <=1.3)
Glucose: 10 mg/dL
Potassium: 4.9 mmol/L (ref 3.4–5.3)
Sodium: 139 mmol/L (ref 137–147)

## 2014-04-10 LAB — LIPID PANEL
Cholesterol: 183 mg/dL (ref 0–200)
HDL: 56 mg/dL (ref 35–70)
LDL Cholesterol: 113 mg/dL
Triglycerides: 69 mg/dL (ref 40–160)

## 2014-04-19 ENCOUNTER — Encounter: Payer: Self-pay | Admitting: Family Medicine

## 2014-04-30 ENCOUNTER — Other Ambulatory Visit: Payer: Self-pay | Admitting: Family Medicine

## 2014-05-16 ENCOUNTER — Ambulatory Visit (INDEPENDENT_AMBULATORY_CARE_PROVIDER_SITE_OTHER): Payer: PRIVATE HEALTH INSURANCE | Admitting: Family Medicine

## 2014-05-16 ENCOUNTER — Encounter: Payer: Self-pay | Admitting: Family Medicine

## 2014-05-16 VITALS — BP 122/88 | HR 90 | Ht 69.0 in | Wt 215.0 lb

## 2014-05-16 DIAGNOSIS — E785 Hyperlipidemia, unspecified: Secondary | ICD-10-CM

## 2014-05-16 DIAGNOSIS — Z23 Encounter for immunization: Secondary | ICD-10-CM

## 2014-05-16 DIAGNOSIS — Z2911 Encounter for prophylactic immunotherapy for respiratory syncytial virus (RSV): Secondary | ICD-10-CM

## 2014-05-16 DIAGNOSIS — R7302 Impaired glucose tolerance (oral): Secondary | ICD-10-CM

## 2014-05-16 DIAGNOSIS — I10 Essential (primary) hypertension: Secondary | ICD-10-CM

## 2014-05-16 DIAGNOSIS — R7309 Other abnormal glucose: Secondary | ICD-10-CM

## 2014-05-16 DIAGNOSIS — Z0000 Encounter for general adult medical examination without abnormal findings: Secondary | ICD-10-CM

## 2014-05-16 DIAGNOSIS — E669 Obesity, unspecified: Secondary | ICD-10-CM

## 2014-05-16 LAB — POCT URINALYSIS DIPSTICK
Bilirubin, UA: NEGATIVE
Blood, UA: NEGATIVE
Glucose, UA: NEGATIVE
Ketones, UA: NEGATIVE
Leukocytes, UA: NEGATIVE
Nitrite, UA: NEGATIVE
Protein, UA: NEGATIVE
Spec Grav, UA: 1.015
Urobilinogen, UA: NEGATIVE
pH, UA: 6

## 2014-05-16 MED ORDER — RAMIPRIL 5 MG PO CAPS: ORAL_CAPSULE | ORAL | Status: DC

## 2014-05-16 NOTE — Progress Notes (Signed)
I gave the patient his flu shot today due to the [place]. Not in system yet  FLUARIX NDC: 12458-099-83 LOT: [id number] EXP: 03/27/2015   RIGHT DELTOID

## 2014-05-16 NOTE — Progress Notes (Signed)
Subjective:    Patient ID: Jesse Mcclure, male    DOB: 11/29/1950, 63 y.o.   MRN: [id number]  HPI He is here for complete examination. He did bring blood work from his place of employment. He did show LDL of 113 and a hemoglobin A1c of 5.8. Continues on his blood pressure medication and is having no difficulty with that. He has started into an exercise program. He is seen regularly by hematology for hemochromatosis He has no other concerns or complaints. Family and social history was reviewed. He does do well on alcohol consumption currently but does tend to over do it on the weekends. Let work does show a PSA in the mid 3 range. Percent PSA was also done. He also has a history of chest 2 removal and he thinks it was the right side. This was done due to undescended testes in his teenage years. He has had a colonoscopy which was apparently negative.  Review of Systems  All other systems reviewed and are negative.      Objective:   Physical Exam BP 122/88  Pulse 90  Ht 5\' 9"  (1.753 m)  Wt 215 lb (97.523 kg)  BMI 31.74 kg/m2  General Appearance:    Alert, cooperative, no distress, appears stated age  Head:    Normocephalic, without obvious abnormality, atraumatic  Eyes:    PERRL, conjunctiva/corneas clear, EOM's intact, fundi    benign  Ears:    Normal TM's and external ear canals  Nose:   Nares normal, mucosa normal, no drainage or sinus   tenderness  Throat :   Lips, mucosa, and tongue normal; teeth and gums normal  Neck:   Supple, no lymphadenopathy;  thyroid:  no   enlargement/tenderness/nodules; no carotid   bruit or JVD  Back:    Spine nontender, no curvature, ROM normal, no CVA     tenderness  Lungs:     Clear to auscultation bilaterally without wheezes, rales or     ronchi; respirations unlabored  Chest Wall:    No tenderness or deformity   Heart:    Regular rate and rhythm, S1 and S2 normal, no murmur, rub   or gallop  Breast Exam:    No chest wall tenderness, masses or  gynecomastia  Abdomen:     Soft, non-tender, nondistended, normoactive bowel sounds,    no masses, no hepatosplenomegaly        Extremities:   No clubbing, cyanosis or edema  Pulses:   2+ and symmetric all extremities  Skin:   Skin color, texture, turgor normal, no rashes or lesions  Lymph nodes:   Cervical, supraclavicular, and axillary nodes normal  Neurologic:   CNII-XII intact, normal strength, sensation and gait; reflexes 2+ and symmetric throughout          Psych:   Normal mood, affect, hygiene and grooming.          Assessment & Plan:  Routine general medical examination at a health care facility - Plan: POCT Urinalysis Dipstick  Glucose intolerance (impaired glucose tolerance)  Obesity (BMI 30-39.9)  Need for prophylactic vaccination and inoculation against influenza - Plan: Flu vaccine greater than 3yo with preservative IM (Fluzone trivalent)  Need for shingles vaccine - Plan: Varicella-zoster vaccine subcutaneous his immunizations will be updated. Also discussed glucose intolerance with him and its  relationship to diabetes, hypertension, heart disease. Strongly encouraged him to continue with his exercise program. Explained that this needs to be a permanent lifestyle changes to reduce  his risk of the above-mentioned problems. Encouraged him to set a waist goal of 2 inches loss by Christmas and another 2 inches in another 6 months.

## 2014-05-16 NOTE — Patient Instructions (Signed)
Permanent lifestyle change 

## 2014-05-16 NOTE — Progress Notes (Signed)
Pt asked me to fax over his office notes from today visit to Marylen Ponto (the company nurse) @ [employer] to [phone].

## 2014-05-22 NOTE — Addendum Note (Signed)
Addended by: Randel Books on: 05/22/2014 09:11 AM   Modules accepted: Orders

## 2014-10-11 ENCOUNTER — Telehealth: Payer: Self-pay | Admitting: *Deleted

## 2014-10-11 ENCOUNTER — Other Ambulatory Visit (HOSPITAL_BASED_OUTPATIENT_CLINIC_OR_DEPARTMENT_OTHER): Payer: PRIVATE HEALTH INSURANCE

## 2014-10-11 LAB — CBC WITH DIFFERENTIAL/PLATELET
BASO%: 0.8 % (ref 0.0–2.0)
Basophils Absolute: 0 10*3/uL (ref 0.0–0.1)
EOS%: 3.3 % (ref 0.0–7.0)
Eosinophils Absolute: 0.2 10*3/uL (ref 0.0–0.5)
HCT: 45 % (ref 38.4–49.9)
HGB: 15 g/dL (ref 13.0–17.1)
LYMPH%: 27.6 % (ref 14.0–49.0)
MCH: 32.8 pg (ref 27.2–33.4)
MCHC: 33.2 g/dL (ref 32.0–36.0)
MCV: 98.7 fL — ABNORMAL HIGH (ref 79.3–98.0)
MONO#: 0.6 10*3/uL (ref 0.1–0.9)
MONO%: 10.1 % (ref 0.0–14.0)
NEUT#: 3.5 10*3/uL (ref 1.5–6.5)
NEUT%: 58.2 % (ref 39.0–75.0)
Platelets: 202 10*3/uL (ref 140–400)
RBC: 4.56 10*6/uL (ref 4.20–5.82)
RDW: 12.9 % (ref 11.0–14.6)
WBC: 6 10*3/uL (ref 4.0–10.3)
lymph#: 1.6 10*3/uL (ref 0.9–3.3)

## 2014-10-11 LAB — FERRITIN CHCC: Ferritin: 54 ng/ml (ref 22–316)

## 2014-10-11 NOTE — Telephone Encounter (Signed)
Left VM for pt informing of ferritin level and no need for phlebotomy now per Dr. Alvy Bimler.  We will schedule phlebotomy to be done same day he returns for lab/md on 04/05/15.  Asked him to call back if any questions.

## 2014-10-11 NOTE — Telephone Encounter (Signed)
-----   Message from Heath Lark, MD sent at 10/11/2014  1:02 PM EST ----- Regarding: cbc and ferritin Please call him. I do not recommend phlebotomy now. Please add phlebotomy same day for his return appt in July Thanks ----- Message -----    From: [facility]    Sent: 10/11/2014  11:38 AM      To: Heath Lark, MD

## 2014-10-29 LAB — HM COLONOSCOPY

## 2014-11-27 ENCOUNTER — Encounter: Payer: Self-pay | Admitting: Family Medicine

## 2014-11-28 ENCOUNTER — Encounter: Payer: Self-pay | Admitting: Internal Medicine

## 2015-04-04 ENCOUNTER — Telehealth: Payer: Self-pay | Admitting: Hematology and Oncology

## 2015-04-04 NOTE — Telephone Encounter (Signed)
Returned patient call re needing to reschedule 7/1 appointment due to he is going out of town. Spoke with NG and per NG she sent message to SD re earlier time but if patient could not do that date/time she could see him on 7/7. Spoke with patient re earlier time for 7/1 lab/NG @ 9:45 am and SD leaving a message this morning. Per patient this is fine. Appointment for phelb on 7/7 remains the same.

## 2015-04-04 NOTE — Telephone Encounter (Signed)
returned call and lvm for pt regarding to 7.1 appt moved to earlier time per pt request....MD okd earlier time

## 2015-04-05 ENCOUNTER — Other Ambulatory Visit (HOSPITAL_BASED_OUTPATIENT_CLINIC_OR_DEPARTMENT_OTHER): Payer: PRIVATE HEALTH INSURANCE

## 2015-04-05 ENCOUNTER — Ambulatory Visit (HOSPITAL_BASED_OUTPATIENT_CLINIC_OR_DEPARTMENT_OTHER): Payer: PRIVATE HEALTH INSURANCE | Admitting: Hematology and Oncology

## 2015-04-05 ENCOUNTER — Telehealth: Payer: Self-pay | Admitting: *Deleted

## 2015-04-05 ENCOUNTER — Encounter: Payer: Self-pay | Admitting: Hematology and Oncology

## 2015-04-05 ENCOUNTER — Other Ambulatory Visit: Payer: PRIVATE HEALTH INSURANCE

## 2015-04-05 ENCOUNTER — Ambulatory Visit: Payer: PRIVATE HEALTH INSURANCE | Admitting: Hematology and Oncology

## 2015-04-05 LAB — CBC WITH DIFFERENTIAL/PLATELET
BASO%: 0.5 % (ref 0.0–2.0)
Basophils Absolute: 0 10*3/uL (ref 0.0–0.1)
EOS%: 1.7 % (ref 0.0–7.0)
Eosinophils Absolute: 0.1 10*3/uL (ref 0.0–0.5)
HCT: 45.3 % (ref 38.4–49.9)
HGB: 15.3 g/dL (ref 13.0–17.1)
LYMPH%: 27.3 % (ref 14.0–49.0)
MCH: 33.3 pg (ref 27.2–33.4)
MCHC: 33.8 g/dL (ref 32.0–36.0)
MCV: 98.7 fL — ABNORMAL HIGH (ref 79.3–98.0)
MONO#: 0.8 10*3/uL (ref 0.1–0.9)
MONO%: 13.4 % (ref 0.0–14.0)
NEUT#: 3.3 10*3/uL (ref 1.5–6.5)
NEUT%: 57.1 % (ref 39.0–75.0)
Platelets: 211 10*3/uL (ref 140–400)
RBC: 4.59 10*6/uL (ref 4.20–5.82)
RDW: 12.7 % (ref 11.0–14.6)
WBC: 5.8 10*3/uL (ref 4.0–10.3)
lymph#: 1.6 10*3/uL (ref 0.9–3.3)

## 2015-04-05 LAB — FERRITIN CHCC: Ferritin: 92 ng/ml (ref 22–316)

## 2015-04-05 NOTE — Assessment & Plan Note (Addendum)
The patient had numerous phlebotomy sessions. He's starting to get symptoms of iron deficiency with leg cramps.. I recommend relaxing the rule and only do phlebotomy if his ferritin is greater than 100. The patient will get blood work drawn at his primary care doctor's office next year. I gave him a letter for this.

## 2015-04-05 NOTE — Telephone Encounter (Signed)
LVM for pt informing of Dr. Calton Dach message below and canceled phlebotomy appt next week.  Asked him to call back if any questions.

## 2015-04-05 NOTE — Telephone Encounter (Signed)
-----   Message from Heath Lark, MD sent at 04/05/2015 12:19 PM EDT ----- Regarding: ferritin pls let him know ferritin is good, no need phlebotomy ----- Message -----    From: [facility]    Sent: 04/05/2015  10:34 AM      To: Heath Lark, MD

## 2015-04-05 NOTE — Progress Notes (Signed)
[place] NOTE  Jesse Haste, MD SUMMARY OF HEMATOLOGIC HISTORY:  I reviewed the patient's records extensive and collaborated the history with the patient. 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 He is a homozygote. Initial diagnosis dates to 11/28/2008. Ferritin at that time was 459. He is on an 3 month phlebotomy program which was subsequently discontinued.  INTERVAL HISTORY: Jesse Mcclure 64 y.o. male returns for further follow-up. He feels fine. He has occasional leg cramps.  I have reviewed the past medical history, past surgical history, social history and family history with the patient and they are unchanged from previous note.  ALLERGIES:  has No Known Allergies.  MEDICATIONS:  Current Outpatient Prescriptions  Medication Sig Dispense Refill  . ibuprofen (ADVIL,MOTRIN) 200 MG tablet Take 400 mg by mouth every 8 (eight) hours as needed.     . ramipril (ALTACE) 5 MG capsule take 1 capsule by mouth once daily 90 capsule 3   No current facility-administered medications for this visit.     REVIEW OF SYSTEMS:   Constitutional: Denies fevers, chills or night sweats Eyes: Denies blurriness of vision Ears, nose, mouth, throat, and face: Denies mucositis or sore throat Respiratory: Denies cough, dyspnea or wheezes Cardiovascular: Denies palpitation, chest discomfort or lower extremity swelling Gastrointestinal:  Denies nausea, heartburn or change in bowel habits Skin: Denies abnormal skin rashes Lymphatics: Denies new lymphadenopathy or easy bruising Neurological:Denies numbness, tingling or new weaknesses Behavioral/Psych: Mood is stable, no new changes  All other systems were reviewed with the patient and are negative.  PHYSICAL EXAMINATION: ECOG PERFORMANCE STATUS: 0 - Asymptomatic  Filed Vitals:   04/05/15 1035  BP: 159/82  Pulse: 86  Temp: 98.1 F (36.7  C)  Resp: 18   Filed Weights   04/05/15 1035  Weight: 216 lb 6.4 oz (98.158 kg)    GENERAL:alert, no distress and comfortable SKIN: skin color, texture, turgor are normal, no rashes or significant lesions EYES: normal, Conjunctiva are pink and non-injected, sclera clear Musculoskeletal:no cyanosis of digits and no clubbing  NEURO: alert & oriented x 3 with fluent speech, no focal motor/sensory deficits  LABORATORY DATA:  I have reviewed the data as listed Results for orders placed or performed in visit on 04/05/15 (from the past 48 hour(s))  CBC with Differential     Status: Abnormal   Collection Time: 04/05/15 10:25 AM  Result Value Ref Range   WBC 5.8 4.0 - 10.3 10e3/uL   NEUT# 3.3 1.5 - 6.5 10e3/uL   HGB 15.3 13.0 - 17.1 g/dL   HCT 45.3 38.4 - 49.9 %   Platelets 211 140 - 400 10e3/uL   MCV 98.7 (H) 79.3 - 98.0 fL   MCH 33.3 27.2 - 33.4 pg   MCHC 33.8 32.0 - 36.0 g/dL   RBC 4.59 4.20 - 5.82 10e6/uL   RDW 12.7 11.0 - 14.6 %   lymph# 1.6 0.9 - 3.3 10e3/uL   MONO# 0.8 0.1 - 0.9 10e3/uL   Eosinophils Absolute 0.1 0.0 - 0.5 10e3/uL   Basophils Absolute 0.0 0.0 - 0.1 10e3/uL   NEUT% 57.1 39.0 - 75.0 %   LYMPH% 27.3 14.0 - 49.0 %   MONO% 13.4 0.0 - 14.0 %   EOS% 1.7 0.0 - 7.0 %   BASO% 0.5 0.0 - 2.0 %  Ferritin     Status: None   Collection Time: 04/05/15 10:25 AM  Result Value Ref Range  Ferritin 92 22 - 316 ng/ml    Lab Results  Component Value Date   WBC 5.8 04/05/2015   HGB 15.3 04/05/2015   HCT 45.3 04/05/2015   MCV 98.7* 04/05/2015   PLT 211 04/05/2015    ASSESSMENT & PLAN:  Hemochromatosis, hereditary The patient had numerous phlebotomy sessions. He's starting to get symptoms of iron deficiency with leg cramps.. I recommend relaxing the rule and only do phlebotomy if his ferritin is greater than 100. The patient will get blood work drawn at his primary care doctor's office next year. I gave him a letter for this.    All questions were answered. The  patient knows to call the clinic with any problems, questions or concerns. No barriers to learning was detected.  I spent 15 minutes counseling the patient face to face. The total time spent in the appointment was 20 minutes and more than 50% was on counseling.     [facility], Lannette Avellino, MD 7/1/20161:22 PM

## 2015-05-20 ENCOUNTER — Encounter: Payer: Self-pay | Admitting: Family Medicine

## 2015-05-20 ENCOUNTER — Ambulatory Visit (INDEPENDENT_AMBULATORY_CARE_PROVIDER_SITE_OTHER): Payer: PRIVATE HEALTH INSURANCE | Admitting: Family Medicine

## 2015-05-20 VITALS — BP 126/80 | HR 81 | Ht 69.0 in | Wt 219.0 lb

## 2015-05-20 DIAGNOSIS — E669 Obesity, unspecified: Secondary | ICD-10-CM | POA: Insufficient documentation

## 2015-05-20 DIAGNOSIS — E785 Hyperlipidemia, unspecified: Secondary | ICD-10-CM | POA: Diagnosis not present

## 2015-05-20 DIAGNOSIS — R972 Elevated prostate specific antigen [PSA]: Secondary | ICD-10-CM | POA: Insufficient documentation

## 2015-05-20 DIAGNOSIS — M1990 Unspecified osteoarthritis, unspecified site: Secondary | ICD-10-CM | POA: Diagnosis not present

## 2015-05-20 DIAGNOSIS — I10 Essential (primary) hypertension: Secondary | ICD-10-CM | POA: Diagnosis not present

## 2015-05-20 DIAGNOSIS — Z9079 Acquired absence of other genital organ(s): Secondary | ICD-10-CM

## 2015-05-20 DIAGNOSIS — Z0000 Encounter for general adult medical examination without abnormal findings: Secondary | ICD-10-CM

## 2015-05-20 DIAGNOSIS — Z9889 Other specified postprocedural states: Secondary | ICD-10-CM

## 2015-05-20 LAB — POCT URINALYSIS DIPSTICK
Bilirubin, UA: NEGATIVE
Blood, UA: NEGATIVE
Glucose, UA: NEGATIVE
Ketones, UA: NEGATIVE
Leukocytes, UA: NEGATIVE
Nitrite, UA: NEGATIVE
Protein, UA: NEGATIVE
Spec Grav, UA: 1.025
Urobilinogen, UA: NEGATIVE
pH, UA: 5.5

## 2015-05-20 MED ORDER — RAMIPRIL 5 MG PO CAPS: ORAL_CAPSULE | ORAL | Status: DC

## 2015-05-20 NOTE — Progress Notes (Signed)
Subjective:    Patient ID: Jesse Mcclure, male    DOB: 24-May-1951, 64 y.o.   MRN: [id number]  HPI He is here for complete examination. He does have a history of hemochromatosis and is followed regularly by hematology for this. Continues to do well on his all taste for underlying hypertension. He does have a history of elevated PSA. He did have recent blood work done at work. The blood work was evaluated. It did show an elevated PSA with a relative risk of 25% for cancer. She also complains of bilateral knee pain especially when going up and down steps. Apparently he did have x-rays done several years ago which did show some minimal arthritic changes.He and his partner are considering getting involved in a weight loss and exercise program. He does have a history of hyperlipidemia. He has had no chest pain, shortness of breath, abdominal pain or diarrhea/constipation. He is considering retiring in the next several years. His health maintenance, immunizations, family and social history were reviewed.Does have a remote history of surgical removal of the left testing due to undescended testes.  Review of Systems  All other systems reviewed and are negative.      Objective:   Physical Exam BP 126/80 mmHg  Pulse 81  Ht 5\' 9"  (1.753 m)  Wt 219 lb (99.338 kg)  BMI 32.33 kg/m2  SpO2 96%  General Appearance:    Alert, cooperative, no distress, appears stated age  Head:    Normocephalic, without obvious abnormality, atraumatic  Eyes:    PERRL, conjunctiva/corneas clear, EOM's intact, fundi    benign  Ears:    Normal TM's and external ear canals  Nose:   Nares normal, mucosa normal, no drainage or sinus   tenderness  Throat:   Lips, mucosa, and tongue normal; teeth and gums normal  Neck:   Supple, no lymphadenopathy;  thyroid:  no   enlargement/tenderness/nodules; no carotid   bruit or JVD  Back:    Spine nontender, no curvature, ROM normal, no CVA     tenderness  Lungs:     Clear to  auscultation bilaterally without wheezes, rales or     ronchi; respirations unlabored  Chest Wall:    No tenderness or deformity   Heart:    Regular rate and rhythm, S1 and S2 normal, no murmur, rub   or gallop  Breast Exam:    No chest wall tenderness, masses or gynecomastia  Abdomen:     Soft, non-tender, nondistended, normoactive bowel sounds,    no masses, no hepatosplenomegaly  Genitalia:    Normal male external genitalia without lesions.  Left Testicle without masses.  No inguinal hernias.     Extremities:   No clubbing, cyanosis or edema  Pulses:   2+ and symmetric all extremities  Skin:   Skin color, texture, turgor normal, no rashes or lesions  Lymph nodes:   Cervical, supraclavicular, and axillary nodes normal  Neurologic:   CNII-XII intact, normal strength, sensation and gait; reflexes 2+ and symmetric throughout          Psych:   Normal mood, affect, hygiene and grooming.          Assessment & Plan:  Routine general medical examination at a health care facility - Plan: POCT Urinalysis Dipstick  Essential hypertension - Plan: POCT Urinalysis Dipstick, ramipril (ALTACE) 5 MG capsule  Hyperlipidemia  Hemochromatosis, hereditary  Obesity (BMI 30-39.9)  Elevated PSA  History of orchiectomy, unilateral  Arthritis We discussed the elevated  PSA in great detail. Discussed 25% likelihood of this and possible options in terms of watchful waiting versus referral to urology. He would prefer to wait on this. Recommend he check this every 6 months. Also recommend he have hepatitis C screening. Encouraged him to make diet and exercise changes on a permanent basis. Discussed the fact that his bilateral knee pain is most likely related to arthritis. I explained that weight loss could have a tremendously beneficial effect both on his arthritis and on his blood pressure. Discussed getting his waist size down to 34 and getting rid of all of his clothing as he loses the weight. Discussed  the use of various weight loss programs including Weight Watchers.

## 2015-05-20 NOTE — Patient Instructions (Signed)
Your first goal should be permanent lifestyle change. You can measure it with your weight but your weight should never be your goal. American diabetes Association diet, [employer], Mediterranean diet are all very healthy. Her Weight Watchers I would rather have you measure your success with waist size

## 2015-05-24 ENCOUNTER — Encounter: Payer: Self-pay | Admitting: Family Medicine

## 2015-09-13 ENCOUNTER — Encounter: Payer: Self-pay | Admitting: Family Medicine

## 2015-11-11 ENCOUNTER — Other Ambulatory Visit: Payer: Self-pay | Admitting: Family Medicine

## 2015-11-12 LAB — PSA: Prostate Specific Ag, Serum: 4 ng/mL (ref 0.0–4.0)

## 2016-01-03 ENCOUNTER — Telehealth: Payer: Self-pay | Admitting: Hematology and Oncology

## 2016-01-03 NOTE — Telephone Encounter (Signed)
returned call and s.w. pt and sched appt.....pt ok and aware of d.t °

## 2016-01-03 NOTE — Telephone Encounter (Signed)
retunred call and lvm for pt to call back to sched appt

## 2016-01-08 ENCOUNTER — Other Ambulatory Visit: Payer: Self-pay | Admitting: Hematology and Oncology

## 2016-01-09 ENCOUNTER — Telehealth: Payer: Self-pay | Admitting: *Deleted

## 2016-01-09 ENCOUNTER — Ambulatory Visit (HOSPITAL_BASED_OUTPATIENT_CLINIC_OR_DEPARTMENT_OTHER): Payer: PRIVATE HEALTH INSURANCE | Admitting: Hematology and Oncology

## 2016-01-09 ENCOUNTER — Encounter: Payer: Self-pay | Admitting: Hematology and Oncology

## 2016-01-09 ENCOUNTER — Other Ambulatory Visit (HOSPITAL_BASED_OUTPATIENT_CLINIC_OR_DEPARTMENT_OTHER): Payer: PRIVATE HEALTH INSURANCE

## 2016-01-09 DIAGNOSIS — M791 Myalgia: Secondary | ICD-10-CM

## 2016-01-09 DIAGNOSIS — G8929 Other chronic pain: Secondary | ICD-10-CM | POA: Insufficient documentation

## 2016-01-09 DIAGNOSIS — M7918 Myalgia, other site: Secondary | ICD-10-CM

## 2016-01-09 DIAGNOSIS — I10 Essential (primary) hypertension: Secondary | ICD-10-CM

## 2016-01-09 LAB — CBC WITH DIFFERENTIAL/PLATELET
BASO%: 0.6 % (ref 0.0–2.0)
Basophils Absolute: 0 10*3/uL (ref 0.0–0.1)
EOS%: 3 % (ref 0.0–7.0)
Eosinophils Absolute: 0.2 10*3/uL (ref 0.0–0.5)
HCT: 43.9 % (ref 38.4–49.9)
HGB: 14.8 g/dL (ref 13.0–17.1)
LYMPH%: 36.4 % (ref 14.0–49.0)
MCH: 33 pg (ref 27.2–33.4)
MCHC: 33.7 g/dL (ref 32.0–36.0)
MCV: 98 fL (ref 79.3–98.0)
MONO#: 0.7 10*3/uL (ref 0.1–0.9)
MONO%: 13.8 % (ref 0.0–14.0)
NEUT#: 2.4 10*3/uL (ref 1.5–6.5)
NEUT%: 46.2 % (ref 39.0–75.0)
Platelets: 225 10*3/uL (ref 140–400)
RBC: 4.48 10*6/uL (ref 4.20–5.82)
RDW: 12.8 % (ref 11.0–14.6)
WBC: 5.3 10*3/uL (ref 4.0–10.3)
lymph#: 1.9 10*3/uL (ref 0.9–3.3)
nRBC: 0 % (ref 0–0)

## 2016-01-09 LAB — FERRITIN: Ferritin: 116 ng/ml (ref 22–316)

## 2016-01-09 NOTE — Assessment & Plan Note (Signed)
His blood pressure is not under good control. The patient will make appointment to see primary care provider for medication adjustment. 

## 2016-01-09 NOTE — Telephone Encounter (Signed)
-----   Message from Heath Lark, MD sent at 01/09/2016  1:11 PM EDT ----- Regarding: ferritin PLs let him know ferritin is just over 100. No need phlebotomy ----- Message -----    From: [facility]    Sent: 01/09/2016  11:59 AM      To: Heath Lark, MD

## 2016-01-09 NOTE — Assessment & Plan Note (Signed)
His ferritin is stable. Her recent guidelines, he does not need phlebotomy unless is greater than 500. I would establish a threshold of greater than 250 before I would treat him again. He will get his primary retained doctor to check his ferritin and will call me if ferritin level is greater than 250

## 2016-01-09 NOTE — Assessment & Plan Note (Signed)
This is unlikely related to hemochromatosis as his ferritin is low. I suspect could be due to vitamin D deficiency although I have no proof of it. I recommend a trial of vitamin D supplement.

## 2016-01-09 NOTE — Telephone Encounter (Signed)
LVM for pt informing him of Ferritin level and Dr. Calton Dach message below.  Instructed him to call nurse if any questions.

## 2016-01-09 NOTE — Progress Notes (Signed)
[place] NOTE  [name], MD SUMMARY OF HEMATOLOGIC HISTORY:  I reviewed the patient's records extensive and collaborated the history with the patient. 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 He is a homozygote. Initial diagnosis dates to 11/28/2008. Ferritin at that time was 459. He is on an 3 month phlebotomy program which was subsequently discontinued. INTERVAL HISTORY: Jesse Mcclure 65 y.o. male returns for further follow-up. He complained of chronic musculoskeletal pain. There were nonspecific. He take ibuprofen as needed.  I have reviewed the past medical history, past surgical history, social history and family history with the patient and they are unchanged from previous note.  ALLERGIES:  has No Known Allergies.  MEDICATIONS:  Current Outpatient Prescriptions  Medication Sig Dispense Refill  . ibuprofen (ADVIL,MOTRIN) 200 MG tablet Take 400 mg by mouth every 8 (eight) hours as needed.     . ramipril (ALTACE) 5 MG capsule take 1 capsule by mouth once daily 90 capsule 3   No current facility-administered medications for this visit.     REVIEW OF SYSTEMS:   Constitutional: Denies fevers, chills or night sweats Eyes: Denies blurriness of vision Ears, nose, mouth, throat, and face: Denies mucositis or sore throat Respiratory: Denies cough, dyspnea or wheezes Cardiovascular: Denies palpitation, chest discomfort or lower extremity swelling Gastrointestinal:  Denies nausea, heartburn or change in bowel habits Skin: Denies abnormal skin rashes Lymphatics: Denies new lymphadenopathy or easy bruising Neurological:Denies numbness, tingling or new weaknesses Behavioral/Psych: Mood is stable, no new changes  All other systems were reviewed with the patient and are negative.  PHYSICAL EXAMINATION: ECOG PERFORMANCE STATUS: 0 - Asymptomatic  Filed Vitals:   01/09/16  1201  BP: 157/93  Pulse: 81  Temp: 98.4 F (36.9 C)  Resp: 18   Filed Weights   01/09/16 1201  Weight: 221 lb 1.6 oz (100.29 kg)    GENERAL:alert, no distress and comfortable SKIN: skin color, texture, turgor are normal, no rashes or significant lesions EYES: normal, Conjunctiva are pink and non-injected, sclera clear Musculoskeletal:no cyanosis of digits and no clubbing  NEURO: alert & oriented x 3 with fluent speech, no focal motor/sensory deficits  LABORATORY DATA:  I have reviewed the data as listed Results for orders placed or performed in visit on 01/09/16 (from the past 48 hour(s))  Ferritin     Status: None   Collection Time: 01/09/16 11:43 AM  Result Value Ref Range   Ferritin 116 22 - 316 ng/ml  CBC with Differential/Platelet     Status: None   Collection Time: 01/09/16 11:43 AM  Result Value Ref Range   WBC 5.3 4.0 - 10.3 10e3/uL   NEUT# 2.4 1.5 - 6.5 10e3/uL   HGB 14.8 13.0 - 17.1 g/dL   HCT 43.9 38.4 - 49.9 %   Platelets 225 140 - 400 10e3/uL   MCV 98.0 79.3 - 98.0 fL   MCH 33.0 27.2 - 33.4 pg   MCHC 33.7 32.0 - 36.0 g/dL   RBC 4.48 4.20 - 5.82 10e6/uL   RDW 12.8 11.0 - 14.6 %   lymph# 1.9 0.9 - 3.3 10e3/uL   MONO# 0.7 0.1 - 0.9 10e3/uL   Eosinophils Absolute 0.2 0.0 - 0.5 10e3/uL   Basophils Absolute 0.0 0.0 - 0.1 10e3/uL   NEUT% 46.2 39.0 - 75.0 %   LYMPH% 36.4 14.0 - 49.0 %   MONO% 13.8 0.0 - 14.0 %   EOS% 3.0 0.0 -  7.0 %   BASO% 0.6 0.0 - 2.0 %   nRBC 0 0 - 0 %    Lab Results  Component Value Date   WBC 5.3 01/09/2016   HGB 14.8 01/09/2016   HCT 43.9 01/09/2016   MCV 98.0 01/09/2016   PLT 225 01/09/2016  ASSESSMENT & PLAN:  Hemochromatosis, hereditary His ferritin is stable. Her recent guidelines, he does not need phlebotomy unless is greater than 500. I would establish a threshold of greater than 250 before I would treat him again. He will get his primary retained doctor to check his ferritin and will call me if ferritin level is greater  than 250  Essential hypertension His blood pressure is not under good control. The patient will make appointment to see primary care provider for medication adjustment.  Chronic musculoskeletal pain This is unlikely related to hemochromatosis as his ferritin is low. I suspect could be due to vitamin D deficiency although I have no proof of it. I recommend a trial of vitamin D supplement.   All questions were answered. The patient knows to call the clinic with any problems, questions or concerns. No barriers to learning was detected.  I spent 15 minutes counseling the patient face to face. The total time spent in the appointment was 20 minutes and more than 50% was on counseling.     [name], MD 4/6/20171:17 PM

## 2016-05-20 ENCOUNTER — Ambulatory Visit (INDEPENDENT_AMBULATORY_CARE_PROVIDER_SITE_OTHER): Payer: PRIVATE HEALTH INSURANCE | Admitting: Family Medicine

## 2016-05-20 ENCOUNTER — Encounter: Payer: Self-pay | Admitting: Family Medicine

## 2016-05-20 VITALS — BP 122/80 | HR 82 | Ht 69.0 in | Wt 224.0 lb

## 2016-05-20 DIAGNOSIS — Z0000 Encounter for general adult medical examination without abnormal findings: Secondary | ICD-10-CM | POA: Diagnosis not present

## 2016-05-20 DIAGNOSIS — E785 Hyperlipidemia, unspecified: Secondary | ICD-10-CM | POA: Diagnosis not present

## 2016-05-20 DIAGNOSIS — I10 Essential (primary) hypertension: Secondary | ICD-10-CM | POA: Diagnosis not present

## 2016-05-20 DIAGNOSIS — R7302 Impaired glucose tolerance (oral): Secondary | ICD-10-CM | POA: Insufficient documentation

## 2016-05-20 DIAGNOSIS — E559 Vitamin D deficiency, unspecified: Secondary | ICD-10-CM | POA: Insufficient documentation

## 2016-05-20 DIAGNOSIS — Z1159 Encounter for screening for other viral diseases: Secondary | ICD-10-CM

## 2016-05-20 DIAGNOSIS — R972 Elevated prostate specific antigen [PSA]: Secondary | ICD-10-CM

## 2016-05-20 DIAGNOSIS — M1990 Unspecified osteoarthritis, unspecified site: Secondary | ICD-10-CM | POA: Diagnosis not present

## 2016-05-20 DIAGNOSIS — E669 Obesity, unspecified: Secondary | ICD-10-CM

## 2016-05-20 LAB — POCT URINALYSIS DIPSTICK
Bilirubin, UA: NEGATIVE
Blood, UA: NEGATIVE
Glucose, UA: NEGATIVE
Ketones, UA: NEGATIVE
Leukocytes, UA: NEGATIVE
Nitrite, UA: NEGATIVE
Protein, UA: NEGATIVE
Spec Grav, UA: 1.02
Urobilinogen, UA: NEGATIVE
pH, UA: 6

## 2016-05-20 MED ORDER — RAMIPRIL 5 MG PO CAPS: ORAL_CAPSULE | ORAL | 3 refills | Status: DC

## 2016-05-20 NOTE — Patient Instructions (Signed)
Cut back on white food. Bread, pasta, rice, potatoes, sugar

## 2016-05-20 NOTE — Progress Notes (Signed)
Subjective:    Patient ID: Jesse Mcclure, male    DOB: [birth year], 65 y.o.   MRN: [id number]  HPI He is here for a complete examination. He is planning to retire in the near future. He presently is working at [employer]. He does have an underlying diagnosis of hypertension and is on medication for that. He also has a history of hemochromatosis. He will be coming here for further care of this. Notes from the hematologist were reviewed. He notes an improvement in his arthritis symptoms since he started taking vitamin D. He is taking 1000 international units daily. He also has a history of hyperlipidemia. Also in the past his PSA level was elevated however the most recent one did return down into the below the 4 range. Recent blood work from his employment also indicates a hemoglobin A1c of 5.7. He and his partner are considering joining [employer]. He has no other concerns or problems. Family history, social history, health maintenance and immunizations were reviewed.   Review of Systems  All other systems reviewed and are negative.      Objective:   Physical Exam BP 122/80 (BP Location: Left Arm, Patient Position: Sitting, Cuff Size: Large)   Pulse 82   Ht 5\' 9"  (1.753 m)   Wt 224 lb (101.6 kg)   BMI 33.08 kg/m   General Appearance:    Alert, cooperative, no distress, appears stated age  Head:    Normocephalic, without obvious abnormality, atraumatic  Eyes:    PERRL, conjunctiva/corneas clear, EOM's intact, fundi    benign  Ears:    Normal TM's and external ear canals  Nose:   Nares normal, mucosa normal, no drainage or sinus   tenderness  Throat:   Lips, mucosa, and tongue normal; teeth and gums normal  Neck:   Supple, no lymphadenopathy;  thyroid:  no   enlargement/tenderness/nodules; no carotid   bruit or JVD  Back:    Spine nontender, no curvature, ROM normal, no CVA     tenderness  Lungs:     Clear to auscultation bilaterally without wheezes, rales or      ronchi; respirations unlabored  Chest Wall:    No tenderness or deformity   Heart:    Regular rate and rhythm, S1 and S2 normal, no murmur, rub   or gallop     Abdomen:     Soft, non-tender, nondistended, normoactive bowel sounds,    no masses, no hepatosplenomegaly  Genitalia:    Deferred .     Extremities:   No clubbing, cyanosis or edema  Pulses:   2+ and symmetric all extremities  Skin:   Skin color, texture, turgor normal, no rashes or lesions  Lymph nodes:   Cervical, supraclavicular, and axillary nodes normal  Neurologic:   CNII-XII intact, normal strength, sensation and gait; reflexes 2+ and symmetric throughout          Psych:   Normal mood, affect, hygiene and grooming.          Assessment & Plan:  Routine general medical examination at a health care facility - Plan: POCT Urinalysis Dipstick  Essential hypertension - Plan: ramipril (ALTACE) 5 MG capsule  Arthritis  Hyperlipidemia - Plan: Ferritin  Hemochromatosis, hereditary ([place]) - Plan: Ferritin, CANCELED: Ferritin  Obesity (BMI 30-39.9)  Elevated PSA  Vitamin D deficiency - Plan: VITAMIN D 25 Hydroxy (Vit-D Deficiency, Fractures), Ferritin  Need for hepatitis C screening test - Plan: Hepatitis C antibody, Ferritin  Glucose intolerance (  impaired glucose tolerance) - Plan: Ferritin Will continue on his present medications. We will continue to monitor his PSA. I will check his vitamin D level has a has been on increased dosing. We will continue to monitor the ferritin. I had a long discussion with him concerning glucose intolerance as well as his weight. Strongly encouraged him to get involved in a good program and Weight Watchers is certainly appropriate. Discussed other programs. He will follow-up with me on a regular basis.

## 2016-05-21 ENCOUNTER — Encounter: Payer: PRIVATE HEALTH INSURANCE | Admitting: Family Medicine

## 2016-05-21 LAB — FERRITIN: Ferritin: 177 ng/mL (ref 20–380)

## 2016-05-21 LAB — HEPATITIS C ANTIBODY: HCV Ab: NEGATIVE

## 2016-05-21 LAB — VITAMIN D 25 HYDROXY (VIT D DEFICIENCY, FRACTURES): Vit D, 25-Hydroxy: 32 ng/mL (ref 30–100)

## 2016-05-25 ENCOUNTER — Encounter: Payer: Self-pay | Admitting: Family Medicine

## 2016-06-29 ENCOUNTER — Ambulatory Visit (INDEPENDENT_AMBULATORY_CARE_PROVIDER_SITE_OTHER): Payer: PRIVATE HEALTH INSURANCE | Admitting: Podiatry

## 2016-06-29 ENCOUNTER — Ambulatory Visit (INDEPENDENT_AMBULATORY_CARE_PROVIDER_SITE_OTHER): Payer: PRIVATE HEALTH INSURANCE

## 2016-06-29 ENCOUNTER — Encounter: Payer: Self-pay | Admitting: Podiatry

## 2016-06-29 DIAGNOSIS — R52 Pain, unspecified: Secondary | ICD-10-CM | POA: Diagnosis not present

## 2016-06-29 DIAGNOSIS — M2450 Contracture, unspecified joint: Secondary | ICD-10-CM | POA: Diagnosis not present

## 2016-06-29 DIAGNOSIS — M2041 Other hammer toe(s) (acquired), right foot: Secondary | ICD-10-CM

## 2016-06-29 DIAGNOSIS — M2042 Other hammer toe(s) (acquired), left foot: Secondary | ICD-10-CM | POA: Diagnosis not present

## 2016-06-29 NOTE — Progress Notes (Signed)
   Subjective:    Patient ID: Jesse Mcclure, male    DOB: September 29, 1951, 65 y.o.   MRN: [id number]  HPI  65 year old male presents the office today for concerns of his right second and third toe surgery to drift out words and this has been ongoing for last months and has come on gradually. He states that it hurts if he is on his feet for quite some time. Denies any recent injury or trauma. No swelling or redness. He has had no recent treatment. Other complaints at this time. He previously had bunion surgery performed by Dr. Janus Molder. .  Review of Systems  All other systems reviewed and are negative.      Objective:   Physical Exam General: AAO x3, NAD  Dermatological: Skin is warm, dry and supple bilateral. Nails x 10 are well manicured; remaining integument appears unremarkable at this time. There are no open sores, no preulcerative lesions, no rash or signs of infection present.  Vascular: Dorsalis Pedis artery and Posterior Tibial artery pedal pulses are 2/4 bilateral with immedate capillary fill time.  There is no pain with calf compression, swelling, warmth, erythema.   Neruologic: Grossly intact via light touch bilateral. Vibratory intact via tuning fork bilateral. Protective threshold with Semmes Wienstein monofilament intact to all pedal sites bilateral.   Musculoskeletal: There is lateral deviation of the right second third digits. This is reducible to purchase position. There is no areas of pinpoint metatarsal is no pain vibratory sensation. Hallux is in a rectus position and is healed and there is no pain with MPJ range of motion. MMT 5/5. No other areas of tenderness bilaterally.  Gait: Unassisted, Nonantalgic.     Assessment & Plan:  Lateral deviation of the right second third toes likely due to elongated metatarsals and biomechanical changes -Treatment options discussed including all alternatives, risks, and complications -Etiology of symptoms were discussed -X-rays were  obtained and reviewed with the patient. Elongation of the second third metatarsals with an abnormal metatarsal parabola. There is no evidence of acute fracture. Lateral deviation of the digits of 2 and 3 in the right foot -At this time a discussed both conservative and surgical treatment options. This time a discussed with him we will start with conservative treatment as he has had not had any. I dispensed a [employer] brace to help hold the toes are rectus position. I discussed shoe gear changes well. If symptoms continue discussed with him surgical intervention including metatarsal osteotomy of 2 and 3 and pinning of the toes with MPJ release. -Follow-up as scheduled or sooner if needed. Call any questions or concerns.  Celesta Gentile, DPM

## 2016-07-24 ENCOUNTER — Ambulatory Visit (INDEPENDENT_AMBULATORY_CARE_PROVIDER_SITE_OTHER): Payer: PRIVATE HEALTH INSURANCE | Admitting: Podiatry

## 2016-07-24 ENCOUNTER — Encounter: Payer: Self-pay | Admitting: Podiatry

## 2016-07-24 DIAGNOSIS — M2450 Contracture, unspecified joint: Secondary | ICD-10-CM | POA: Diagnosis not present

## 2016-07-24 DIAGNOSIS — M2041 Other hammer toe(s) (acquired), right foot: Secondary | ICD-10-CM

## 2016-07-24 DIAGNOSIS — M2042 Other hammer toe(s) (acquired), left foot: Secondary | ICD-10-CM | POA: Diagnosis not present

## 2016-07-27 ENCOUNTER — Ambulatory Visit: Payer: PRIVATE HEALTH INSURANCE | Admitting: Podiatry

## 2016-07-27 NOTE — Progress Notes (Signed)
Subjective: 65 year old male presents the office if up evaluation of right second and third toe deviation. He tried wearing the splints be states they're very uncomfortable. He elected go ahead and likely proceed with surgical intervention next year after the holidays. He states of the toes are painful with walking shoe gear. He states that they drifted quickly but they have stay in this position for some time do not appear to be progressive this time. He is tried changing his shoes as well without any relief. Denies any systemic complaints such as fevers, chills, nausea, vomiting. No acute changes since last appointment, and no other complaints at this time.   Objective: AAO x3, NAD DP/PT pulses palpable bilaterally, CRT less than 3 seconds There is lateral deviation of the right second third toes they are somewhat reducible into a rectus position. There is no pain with MPJ range of motion. There is no other areas of tenderness. The toes are tender as well as the joints with weightbearing or pressure. Mild hammertoe contractures are present. No open lesions or pre-ulcerative lesions.  No pain with calf compression, swelling, warmth, erythema  Assessment: Right second and third MTPJ contracture, mild hammertoe  Plan: -All treatment options discussed with the patient including all alternatives, risks, complications.  -As he could not wear the brace and get dispensed the silicone toe separator to see if this will help. Discussed that surgical intervention including MPJ release with likely hammertoe repair and possible metatarsal osteotomy. I have tentatively scheduled him for December 09, 2016 however I'll see him prior to this to further discuss the surgery as well as consent forms. -Patient encouraged to call the office with any questions, concerns, change in symptoms.   Celesta Gentile, DPM

## 2016-08-04 ENCOUNTER — Encounter: Payer: Self-pay | Admitting: *Deleted

## 2016-11-23 ENCOUNTER — Telehealth: Payer: Self-pay | Admitting: *Deleted

## 2016-11-23 NOTE — Telephone Encounter (Signed)
"  I am calling to confirm a surgery that I am having on March 7 with Dr. Jacqualyn Posey."  "I called earlier today.  I'm scheduled for surgery on March with Dr. Jacqualyn Posey.  I was calling to see if I need to sign anything, do any paperwork, labs, or any other actions prior to my surgery."  Have you signed consent forms?  "No, I have not done anything."  You will need to come in and see Dr. Jacqualyn Posey for an appointnment for a consent.  Would you like me to schedule you an appointment for a consultation with Dr. Jacqualyn Posey?  "Yes, that will be fine."

## 2016-11-23 NOTE — Telephone Encounter (Signed)
"  My name is Jesse Mcclure.  You can reach me on my cell phone.  Thank you, I appreciate it."

## 2016-12-02 ENCOUNTER — Telehealth: Payer: Self-pay | Admitting: *Deleted

## 2016-12-02 NOTE — Telephone Encounter (Signed)
I'm returning your call.  How can I help you?  "I was calling to make sure you all talked to my insurance company, [employer], about my short term disability.  I was wondering if they had sent you paperwork about me."  You need to speak to [facility] and she has already left for the day.  I will get her to give you a call tomorrow.

## 2016-12-04 ENCOUNTER — Encounter: Payer: Self-pay | Admitting: Podiatry

## 2016-12-04 ENCOUNTER — Ambulatory Visit (INDEPENDENT_AMBULATORY_CARE_PROVIDER_SITE_OTHER): Payer: PRIVATE HEALTH INSURANCE

## 2016-12-04 ENCOUNTER — Ambulatory Visit (INDEPENDENT_AMBULATORY_CARE_PROVIDER_SITE_OTHER): Payer: PRIVATE HEALTH INSURANCE | Admitting: Podiatry

## 2016-12-04 VITALS — BP 147/89 | HR 78 | Resp 18

## 2016-12-04 DIAGNOSIS — M2041 Other hammer toe(s) (acquired), right foot: Secondary | ICD-10-CM

## 2016-12-04 DIAGNOSIS — M216X1 Other acquired deformities of right foot: Secondary | ICD-10-CM

## 2016-12-04 DIAGNOSIS — Z969 Presence of functional implant, unspecified: Secondary | ICD-10-CM

## 2016-12-04 MED ORDER — CEPHALEXIN 500 MG PO CAPS: 500.0000 mg | ORAL_CAPSULE | Freq: Three times a day (TID) | ORAL | 0 refills | Status: DC

## 2016-12-04 MED ORDER — OXYCODONE-ACETAMINOPHEN 5-325 MG PO TABS: 1.0000 | ORAL_TABLET | Freq: Four times a day (QID) | ORAL | 0 refills | Status: DC | PRN

## 2016-12-04 MED ORDER — PROMETHAZINE HCL 25 MG PO TABS: 25.0000 mg | ORAL_TABLET | Freq: Three times a day (TID) | ORAL | 0 refills | Status: DC | PRN

## 2016-12-04 NOTE — Progress Notes (Addendum)
Subjective: 66 year old male presents the office today for continued right foot pain and for surgical consultation. He states he can tease to have pain to the right foot walking and pressure in the toes have been drifting out more. He elected to go ahead and proceed with surgical intervention he is are to schedule for next week. He has attempted bracing, shoe changes without any relief of symptoms. He also states that he gets an occasional pain on the pin sites from the previous bunion surgery. He is asking if they give him be removed at surgery as well.Denies any systemic complaints such as fevers, chills, nausea, vomiting. No acute changes since last appointment, and no other complaints at this time.   Objective: AAO x3, NAD DP/PT pulses palpable bilaterally, CRT less than 3 seconds  the right second and third digits are drifted in the lateral deviation of the third digits overlying the fourth digit. There is contracture of the MPJs of the second third toes and there is mild hammertoe contracture of these digits as well. Pin sites from the previous bunion surgery of her palpable. He occasionally gets pain overlying these areas. No other areas of tenderness of the foot bilaterally. No open lesions or pre-ulcerative lesions.  No pain with calf compression, swelling, warmth, erythema  Assessment: Prominent hardware right foot status post bunion surgery with lateral deviations and elongated metatarsals 2 and 3 of the right foot.  Plan: -All treatment options discussed with the patient including all alternatives, risks, complications.  -X-rays were obtained and reviewed. There appears to be elongation of the second third metatarsals and there is lateral deviation of the toes. -At this time a discussed both conservative and surgical treatment options with the patient. At this time he wishes to go ahead and proceed with surgical intervention. I discussed with him possible hammertoe repair of 2 and 3 with  metatarsal osteotomies of 2 and 3 with screw, wire fixation. Also had removal from the previous bunion surgery. -The incision placement as well as the postoperative course was discussed with the patient. I discussed risks of the surgery which include, but not limited to, infection, bleeding, pain, swelling, need for further surgery, delayed or nonhealing, painful or ugly scar, numbness or sensation changes, over/under correction, recurrence, transfer lesions, further deformity, hardware failure, DVT/PE, loss of toe/foot. Patient understands these risks and wishes to proceed with surgery. The surgical consent was reviewed with the patient all 3 pages were signed. No promises or guarantees were given to the outcome of the procedure. All questions were answered to the best of my ability. Before the surgery the patient was encouraged to call the office if there is any further questions. The surgery will be performed at the [facility] on an outpatient basis. -CAM boot dispensed.  -Postoperative prescriptions provided today. Do not start until after surgery  -Patient encouraged to call the office with any questions, concerns, change in symptoms.   Celesta Gentile, DPM

## 2016-12-04 NOTE — Patient Instructions (Addendum)
Do not start your prescriptions until after surgery  Pre-Operative Instructions  Congratulations, you have decided to take an important step to improving your quality of life.  You can be assured that the doctors of [facility] will be with you every step of the way.  1. Plan to be at the surgery center/hospital at least 1 (one) hour prior to your scheduled time unless otherwise directed by the surgical center/hospital staff.  You must have a responsible adult accompany you, remain during the surgery and drive you home.  Make sure you have directions to the surgical center/hospital and know how to get there on time. 2. For hospital based surgery you will need to obtain a history and physical form from your family physician within 1 month prior to the date of surgery- we will give you a form for you primary physician.  3. We make every effort to accommodate the date you request for surgery.  There are however, times where surgery dates or times have to be moved.  We will contact you as soon as possible if a change in schedule is required.   4. No Aspirin/Ibuprofen for one week before surgery.  If you are on aspirin, any non-steroidal anti-inflammatory medications (Mobic, Aleve, Ibuprofen) you should stop taking it 7 days prior to your surgery.  You make take Tylenol  For pain prior to surgery.  5. Medications- If you are taking daily heart and blood pressure medications, seizure, reflux, allergy, asthma, anxiety, pain or diabetes medications, make sure the surgery center/hospital is aware before the day of surgery so they may notify you which medications to take or avoid the day of surgery. 6. No food or drink after midnight the night before surgery unless directed otherwise by surgical center/hospital staff. 7. No alcoholic beverages 24 hours prior to surgery.  No smoking 24 hours prior to or 24 hours after surgery. 8. Wear loose pants or shorts- loose enough to fit over bandages, boots, and  casts. 9. No slip on shoes, sneakers are best. 10. Bring your boot with you to the surgery center/hospital.  Also bring crutches or a walker if your physician has prescribed it for you.  If you do not have this equipment, it will be provided for you after surgery. 11. If you have not been contracted by the surgery center/hospital by the day before your surgery, call to confirm the date and time of your surgery. 12. Leave-time from work may vary depending on the type of surgery you have.  Appropriate arrangements should be made prior to surgery with your employer. 13. Prescriptions will be provided immediately following surgery by your doctor.  Have these filled as soon as possible after surgery and take the medication as directed. 14. Remove nail polish on the operative foot. 15. Wash the night before surgery.  The night before surgery wash the foot and leg well with the antibacterial soap provided and water paying special attention to beneath the toenails and in between the toes.  Rinse thoroughly with water and dry well with a towel.  Perform this wash unless told not to do so by your physician.  Enclosed: 1 Ice pack (please put in freezer the night before surgery)   1 Hibiclens skin cleaner   Pre-op Instructions  If you have any questions regarding the instructions, do not hesitate to call our office.  : [address] [place], [place] [address] [place]: [address], [place], [place] [address] [phone]  [place]: Valarie Cones  [address]  [place], [place] [address] [phone]   Dr. Ila Mcgill DPM, Dr. Celesta Gentile DPM, Dr. Lanelle Bal DPM, Dr. Landis Martins DPM

## 2016-12-09 DIAGNOSIS — Z4889 Encounter for other specified surgical aftercare: Secondary | ICD-10-CM | POA: Diagnosis not present

## 2016-12-09 DIAGNOSIS — M21541 Acquired clubfoot, right foot: Secondary | ICD-10-CM | POA: Diagnosis not present

## 2016-12-10 ENCOUNTER — Telehealth: Payer: Self-pay | Admitting: *Deleted

## 2016-12-10 NOTE — Telephone Encounter (Signed)
Pt states had surgery with Dr. Jacqualyn Posey yesterday and wanted to know if he had to sleep in the boot. I told him if he was resting and not going to sleep or walk, he could remove the boot and flex leg and foot, but if going to sleep or walk needed to be in the boot. Pt states understanding.

## 2016-12-14 ENCOUNTER — Encounter: Payer: Self-pay | Admitting: Podiatry

## 2016-12-14 ENCOUNTER — Ambulatory Visit (INDEPENDENT_AMBULATORY_CARE_PROVIDER_SITE_OTHER): Payer: PRIVATE HEALTH INSURANCE

## 2016-12-14 ENCOUNTER — Ambulatory Visit (INDEPENDENT_AMBULATORY_CARE_PROVIDER_SITE_OTHER): Payer: Self-pay | Admitting: Podiatry

## 2016-12-14 VITALS — Temp 97.3°F

## 2016-12-14 DIAGNOSIS — M2041 Other hammer toe(s) (acquired), right foot: Secondary | ICD-10-CM

## 2016-12-14 DIAGNOSIS — M216X1 Other acquired deformities of right foot: Secondary | ICD-10-CM

## 2016-12-14 DIAGNOSIS — M2042 Other hammer toe(s) (acquired), left foot: Secondary | ICD-10-CM | POA: Diagnosis not present

## 2016-12-15 NOTE — Progress Notes (Signed)
Subjective: Jesse Mcclure is a 66 y.o. is seen today in office s/p RIGHT 2nd and 3rd metatarsal osteotomies and MTPJ release with ROH from previous bunion surgery preformed on 12/09/2016 (NO SURGERY WAS DONE ON THE LEFT FOOT) They state their pain is minimal and he is not taking any pain medication. He does continue with the surgical boot and he uses a walker to help get around but not all of the time. Denies any systemic complaints such as fevers, chills, nausea, vomiting. No calf pain, chest pain, shortness of breath.   Objective: General: No acute distress, AAOx3  DP/PT pulses palpable 2/4, CRT < 3 sec to all digits.  Protective sensation intact. Motor function intact.  Right foot: Incision is well coapted without any evidence of dehiscence and suture are intact. There is no surrounding erythema, ascending cellulitis, fluctuance, crepitus, malodor, drainage/purulence. There is mild edema around the surgical site. There is minimal pain along the surgical site. Toes are in a rectus position. K-wires intact.  No other areas of tenderness to bilateral lower extremities.  No other open lesions or pre-ulcerative lesions.  No pain with calf compression, swelling, warmth, erythema.   Assessment and Plan:  Status post right foot surgery, doing well with no complications   -Treatment options discussed including all alternatives, risks, and complications -X-rays were obtained and reviewed with the patient. S/p metatarsal osteotomies of 2 and 3 with screws and k-wires intact. No evidence of acute fracture otherwise.  -Antibiotic ointment was applied followed by a bandage. Keep the dressing clean, dry, intact. -Continue with surgical boot. WBAT  -Ice/elevation -Pain medication as needed- he has not been taking -Monitor for any clinical signs or symptoms of infection and DVT/PE and directed to call the office immediately should any occur or go to the ER. -Follow-up in 1 week for possible suture removal or  sooner if any problems arise. In the meantime, encouraged to call the office with any questions, concerns, change in symptoms.   Celesta Gentile, DPM

## 2016-12-21 ENCOUNTER — Ambulatory Visit (INDEPENDENT_AMBULATORY_CARE_PROVIDER_SITE_OTHER): Payer: Self-pay | Admitting: Podiatry

## 2016-12-21 DIAGNOSIS — M216X1 Other acquired deformities of right foot: Secondary | ICD-10-CM

## 2016-12-21 DIAGNOSIS — M2041 Other hammer toe(s) (acquired), right foot: Secondary | ICD-10-CM

## 2016-12-21 DIAGNOSIS — M2042 Other hammer toe(s) (acquired), left foot: Secondary | ICD-10-CM

## 2016-12-21 NOTE — Progress Notes (Signed)
Subjective: Jesse Mcclure is a 66 y.o. is seen today in office s/p RIGHT 2nd and 3rd metatarsal osteotomies and MTPJ release with ROH from previous bunion surgery preformed on 12/09/2016. He says that he is doing "great". He has no pain at any pain medication. He does continue to use the cam boot and walk on his heel with a walker. He presents today for suture removal. Denies any systemic complaints such as fevers, chills, nausea, vomiting. No calf pain, chest pain, shortness of breath.   Objective: General: No acute distress, AAOx3  DP/PT pulses palpable 2/4, CRT < 3 sec to all digits.  Protective sensation intact. Motor function intact.  Right foot: Incision is well coapted without any evidence of dehiscence and suture are intact. There is no surrounding erythema, ascending cellulitis, fluctuance, crepitus, malodor, drainage/purulence. There is mild edema around the surgical site and overall the swelling appears to be improved. There is no pain along the surgical site. Toes are in a rectus position. K-wires intact.  No other areas of tenderness to bilateral lower extremities.  No other open lesions or pre-ulcerative lesions.  No pain with calf compression, swelling, warmth, erythema.   Assessment and Plan:  Status post right foot surgery, doing well with no complications   -Treatment options discussed including all alternatives, risks, and complications -Half of the sutures removed today without complications. Antibiotic ointment was applied followed by a bandage. Daily dressing clean, dry, intact. -Continue with surgical boot. WBAT to heel.  -Ice/elevation -Pain medication if needed. He has not been needing to take.  -Monitor for any clinical signs or symptoms of infection and DVT/PE and directed to call the office immediately should any occur or go to the ER. -Follow-up in 1 week to remove the remainder of the sutures or sooner if any problems arise. In the meantime, encouraged to call the  office with any questions, concerns, change in symptoms.   Celesta Gentile, DPM

## 2016-12-31 ENCOUNTER — Ambulatory Visit (INDEPENDENT_AMBULATORY_CARE_PROVIDER_SITE_OTHER): Payer: PRIVATE HEALTH INSURANCE | Admitting: Podiatry

## 2016-12-31 ENCOUNTER — Encounter: Payer: Self-pay | Admitting: Podiatry

## 2016-12-31 DIAGNOSIS — M216X1 Other acquired deformities of right foot: Secondary | ICD-10-CM

## 2016-12-31 DIAGNOSIS — M2041 Other hammer toe(s) (acquired), right foot: Secondary | ICD-10-CM

## 2016-12-31 DIAGNOSIS — M2042 Other hammer toe(s) (acquired), left foot: Secondary | ICD-10-CM | POA: Diagnosis not present

## 2016-12-31 DIAGNOSIS — Z969 Presence of functional implant, unspecified: Secondary | ICD-10-CM

## 2016-12-31 NOTE — Progress Notes (Signed)
Subjective: Jesse Mcclure is a 66 y.o. is seen today in office s/p RIGHT 2nd and 3rd metatarsal osteotomies and MTPJ release with ROH from previous bunion surgery preformed on 12/09/2016. He states he continues to do well. He presented to the remainder the sutures removed. He is wearing the cam boot walking a cam boot without any problems. Denies any systemic complaints such as fevers, chills, nausea, vomiting. No calf pain, chest pain, shortness of breath.   Objective: General: No acute distress, AAOx3  DP/PT pulses palpable 2/4, CRT < 3 sec to all digits.  Protective sensation intact. Motor function intact.  Right foot: Incision is well coapted without any evidence of dehiscence and suture are intact. There is no surrounding erythema, ascending cellulitis, fluctuance, crepitus, malodor, drainage/purulence. There is minimal edema around the surgical site and overall the swelling appears to be improved. There is no pain along the surgical site. Toes are in a rectus position. K-wires intact.  No other areas of tenderness to bilateral lower extremities.  No other open lesions or pre-ulcerative lesions.  No pain with calf compression, swelling, warmth, erythema.   Assessment and Plan:  Status post right foot surgery, doing well with no complications   -Treatment options discussed including all alternatives, risks, and complications -Remainder the sutures were removed today without complications. The incision remained well coapted after removal. Continued antibiotic ointment dressing changes daily. -Dispensed a short cam boot -Ice and elevation. -He is not taking pain medicine but can as needed -WBAT -Monitor for any clinical signs or symptoms of infection and DVT/PE and directed to call the office immediately should any occur or go to the ER. -Follow-up in 2 week for pin removal or sooner if any problems arise. In the meantime, encouraged to call the office with any questions, concerns, change in  symptoms.   *x-ray next appointment.   Celesta Gentile, DPM

## 2017-01-11 ENCOUNTER — Encounter: Payer: Self-pay | Admitting: Podiatry

## 2017-01-11 ENCOUNTER — Ambulatory Visit (INDEPENDENT_AMBULATORY_CARE_PROVIDER_SITE_OTHER): Payer: Self-pay | Admitting: Podiatry

## 2017-01-11 ENCOUNTER — Ambulatory Visit (INDEPENDENT_AMBULATORY_CARE_PROVIDER_SITE_OTHER): Payer: PRIVATE HEALTH INSURANCE

## 2017-01-11 DIAGNOSIS — M2041 Other hammer toe(s) (acquired), right foot: Secondary | ICD-10-CM

## 2017-01-11 DIAGNOSIS — M2042 Other hammer toe(s) (acquired), left foot: Secondary | ICD-10-CM

## 2017-01-13 NOTE — Progress Notes (Signed)
Subjective: Jesse Mcclure is a 66 y.o. is seen today in office s/p RIGHT 2nd and 3rd metatarsal osteotomies and MTPJ release with ROH from previous bunion surgery preformed on 12/09/2016. He states that he is doing well and has minimal discomfort and is not taking any pain medication. He is continued with a cam boot. He presents today with his sister.  Denies any systemic complaints such as fevers, chills, nausea, vomiting. No calf pain, chest pain, shortness of breath.   Objective: General: No acute distress, AAOx3  DP/PT pulses palpable 2/4, CRT < 3 sec to all digits.  Protective sensation intact. Motor function intact.  Right foot: Incision is well coapted without any evidence of dehiscence and scar has formed.K wires are intact the toes. The toe sits in a more rectus position compared to preoperatively however there slightly drifted laterally. There is minimal discomfort to metatarsal osteotomy size. This no other areas of tenderness identified at this time. There is no drainage or pus coming from the K wire sites and there is no clinical signs of infection of the surgical foot No other areas of tenderness to bilateral lower extremities.  No other open lesions or pre-ulcerative lesions.  No pain with calf compression, swelling, warmth, erythema.   Assessment and Plan:  Status post right foot surgery, doing well with no complications   -Treatment options discussed including all alternatives, risks, and complications -X-rays were obtained and reviewed. Screw and second metatarsal appears to have loosened. There does be recent secondary bone formation along the metatarsal osteotomies. K wires are intact. -K wires removed today without complications in total 2. Antibiotic was applied over the area. -He can start to shower tomorrow and once the holes from the wires close.  -Remain in CAM boot.  -Will hold off on going back to work until after next appointment to see how he is doing. Discussed he  will likely need to have another surgery at some point to remove the hardware.  -RTC as scheduled or sooner if needed. .   *x-ray next appointment.   Celesta Gentile, DPM

## 2017-01-25 ENCOUNTER — Encounter: Payer: Self-pay | Admitting: Podiatry

## 2017-01-25 ENCOUNTER — Ambulatory Visit (INDEPENDENT_AMBULATORY_CARE_PROVIDER_SITE_OTHER): Payer: PRIVATE HEALTH INSURANCE

## 2017-01-25 ENCOUNTER — Ambulatory Visit (INDEPENDENT_AMBULATORY_CARE_PROVIDER_SITE_OTHER): Payer: Self-pay | Admitting: Podiatry

## 2017-01-25 DIAGNOSIS — Z9889 Other specified postprocedural states: Secondary | ICD-10-CM

## 2017-01-25 DIAGNOSIS — M2041 Other hammer toe(s) (acquired), right foot: Secondary | ICD-10-CM

## 2017-01-26 NOTE — Progress Notes (Signed)
Subjective: Jesse Mcclure is a 66 y.o. is seen today in office s/p RIGHT 2nd and 3rd metatarsal osteotomies and MTPJ release with ROH from previous bunion surgery preformed on 12/09/2016. He states that his doing "just fine". He states that he was unable to wear the surgical shoe so he went to a regular shoe and he's been able to wear this for any problems. He is scheduled to go back to work tomorrow he feels that he is able to do so as well. Denies any systemic complaints such as fevers, chills, nausea, vomiting. No calf pain, chest pain, shortness of breath.   Objective: General: No acute distress, AAOx3  DP/PT pulses palpable 2/4, CRT < 3 sec to all digits.  Protective sensation intact. Motor function intact.  Right foot: Incision is well coapted without any evidence of dehiscence and scar has formed. The toes sit in a slightly lateral deviated position however they are more rectus compared to prior to surgery. Incision is well-healed. There is minimal edema to the foot is no erythema or increase in warmth. There is no drainage or pus expressed. No other areas of tenderness to bilateral lower extremities.  No other open lesions or pre-ulcerative lesions.  No pain with calf compression, swelling, warmth, erythema.   Assessment and Plan:  Status post right foot surgery, doing well with no complications   -Treatment options discussed including all alternatives, risks, and complications -X-rays were obtained and reviewed with the patient. Secondary bone healing is present along the metatarsal osteotomy sites. -Can continue with supportive shoe.  -Able to return to work tomorrow. Note provided. -Compression anklet for swelling.   -RTC as scheduled  *x-ray next appointment.   Celesta Gentile, DPM

## 2017-02-15 ENCOUNTER — Ambulatory Visit (INDEPENDENT_AMBULATORY_CARE_PROVIDER_SITE_OTHER): Payer: Self-pay | Admitting: Podiatry

## 2017-02-15 ENCOUNTER — Ambulatory Visit (INDEPENDENT_AMBULATORY_CARE_PROVIDER_SITE_OTHER): Payer: PRIVATE HEALTH INSURANCE

## 2017-02-15 ENCOUNTER — Encounter: Payer: Self-pay | Admitting: Podiatry

## 2017-02-15 ENCOUNTER — Ambulatory Visit: Payer: PRIVATE HEALTH INSURANCE

## 2017-02-15 DIAGNOSIS — Z9889 Other specified postprocedural states: Secondary | ICD-10-CM

## 2017-02-15 DIAGNOSIS — M2042 Other hammer toe(s) (acquired), left foot: Secondary | ICD-10-CM

## 2017-02-15 DIAGNOSIS — M2041 Other hammer toe(s) (acquired), right foot: Secondary | ICD-10-CM

## 2017-02-16 NOTE — Progress Notes (Signed)
Subjective: Jesse Mcclure is a 66 y.o. is seen today in office s/p RIGHT 2nd and 3rd metatarsal osteotomies and MTPJ release with ROH from previous bunion surgery preformed on 12/09/2016. He states that he is doing well. He has been wearing a regular shoe he is actually been going to the gym working with his Physiological scientist. He is also been working without any problems. He states he gets some stiffness to the toes but denies any pain. He also gets some intermittent swelling at times. No numbness or tingling. Overall he states that he is doing fine. Denies any systemic complaints such as fevers, chills, nausea, vomiting. No calf pain, chest pain, shortness of breath.   Objective: General: No acute distress, AAOx3  DP/PT pulses palpable 2/4, CRT < 3 sec to all digits.  Protective sensation intact. Motor function intact.  Right foot: Incision is well coapted without any evidence of dehiscence and scar has formed. The toes sit in a mildly lateral deviated position however they are more rectus compared to prior to surgery. Incision is well-healed. There is mild edema to the foot is no erythema or increase in warmth. There is no drainage or pus expressed. Hardware is not palpable. No pain to palpation to the surgical site.  No other areas of tenderness to bilateral lower extremities.  No other open lesions or pre-ulcerative lesions.  No pain with calf compression, swelling, warmth, erythema.   Assessment and Plan:  Status post right foot surgery, doing well with no complications; mild stiffness   -Treatment options discussed including all alternatives, risks, and complications -X-rays were obtained and reviewed with the patient. Secondary bone healing is present along the metatarsal osteotomy sites. -Will start PT to work on the joint stiffness to the 2nd and 3rd MTPJ. Also discussed home exercises that he can do to help with this. He is going to the gym working with a trainer and discussed he can start  to increase in foot exercises.  -Can continue with supportive shoe.  -Compression anklet for swelling.   -RTC as scheduled  Celesta Gentile, DPM

## 2017-03-09 ENCOUNTER — Encounter: Payer: Self-pay | Admitting: Podiatry

## 2017-03-09 NOTE — Progress Notes (Signed)
Right foot removal of hardware remaining from previous bunion surgery Hammertoe repair right 2nd/3rd toe with metatarsal osteotomy (cutting/repositioning of bone behind toes with sutures/wires)

## 2017-07-29 ENCOUNTER — Ambulatory Visit (INDEPENDENT_AMBULATORY_CARE_PROVIDER_SITE_OTHER): Payer: Medicare Other | Admitting: Family Medicine

## 2017-07-29 ENCOUNTER — Encounter: Payer: Self-pay | Admitting: Family Medicine

## 2017-07-29 VITALS — BP 170/108 | HR 72 | Ht 68.0 in | Wt 231.0 lb

## 2017-07-29 DIAGNOSIS — E559 Vitamin D deficiency, unspecified: Secondary | ICD-10-CM

## 2017-07-29 DIAGNOSIS — M1990 Unspecified osteoarthritis, unspecified site: Secondary | ICD-10-CM | POA: Diagnosis not present

## 2017-07-29 DIAGNOSIS — R972 Elevated prostate specific antigen [PSA]: Secondary | ICD-10-CM | POA: Diagnosis not present

## 2017-07-29 DIAGNOSIS — R7302 Impaired glucose tolerance (oral): Secondary | ICD-10-CM

## 2017-07-29 DIAGNOSIS — G8929 Other chronic pain: Secondary | ICD-10-CM

## 2017-07-29 DIAGNOSIS — M7918 Myalgia, other site: Secondary | ICD-10-CM | POA: Diagnosis not present

## 2017-07-29 DIAGNOSIS — I10 Essential (primary) hypertension: Secondary | ICD-10-CM

## 2017-07-29 DIAGNOSIS — Z23 Encounter for immunization: Secondary | ICD-10-CM

## 2017-07-29 DIAGNOSIS — E785 Hyperlipidemia, unspecified: Secondary | ICD-10-CM

## 2017-07-29 DIAGNOSIS — Z9079 Acquired absence of other genital organ(s): Secondary | ICD-10-CM | POA: Diagnosis not present

## 2017-07-29 DIAGNOSIS — E669 Obesity, unspecified: Secondary | ICD-10-CM | POA: Diagnosis not present

## 2017-07-29 MED ORDER — RAMIPRIL 5 MG PO CAPS: ORAL_CAPSULE | ORAL | 3 refills | Status: DC

## 2017-07-29 NOTE — Progress Notes (Signed)
Jesse Mcclure is a 66 y.o. male who presents for annual wellness visit and follow-up on chronic medical conditions.  He has the following concerns: He has a history of hemochromatosis and does follow up with hematology concerning this. There is also question about vitamin D deficiency and he's been on vitamin D. He also has a history of impaired fasting glucose. Does have hypertension but has not been taking his medications on a regular basis. Does have chronic musculoskeletal aches and pains and does use an occasional OTC med for that. He has been alternating thinking that this could possibly be related to work stress. He is now retired. He also has a history of elevated PSA with his last PSA being roughly 6. He also has a history of hyperlipidemia but not presently on medication. As mentioned above he is now retired. He is in a long-term monogamous relationship.  Immunizations and Health Maintenance Immunization History  Administered Date(s) Administered  . Influenza Split 07/05/2012  . Influenza Whole 07/11/2013, 07/31/2016  . Influenza,inj,Quad PF,6+ Mos 05/22/2014  . Tdap 10/28/2012  . Zoster 05/16/2014   Health Maintenance Due  Topic Date Due  . HIV Screening  09/09/1966  . PNA vac Low Risk Adult (1 of 2 - PCV13) 09/09/2016  . INFLUENZA VACCINE  05/05/2017    Last colonoscopy:2016 Last PSA: 2017 Dentist: Micah Flesher Ophtho: Kathrin Penner Exercise: Gym twice per week. Walks the dog twice per day  Other doctors caring for patient include:Gorsuch  Advanced Directives: Does Patient Have a Medical Advance Directive?: Yes Type of Advance Directive: Healthcare Power of Attorney, Living will [place] in Chart?: No - copy requested  Depression screen:  See questionnaire below.     Depression screen [facility] 2/9 07/29/2017 05/20/2015 05/16/2014 10/28/2012  Decreased Interest 0 0 0 0  Down, Depressed, Hopeless 0 0 0 0  PHQ - 2 Score 0 0 0 0    Fall Screen: See  Questionaire below.   Fall Risk  07/29/2017 05/20/2015 05/16/2014  Falls in the past year? No No No    ADL screen:  See questionnaire below.  Functional Status Survey: Is the patient deaf or have difficulty hearing?: No Does the patient have difficulty seeing, even when wearing glasses/contacts?: No Does the patient have difficulty concentrating, remembering, or making decisions?: Yes (slight memory decrease) Does the patient have difficulty walking or climbing stairs?: Yes (stairs-knees) Does the patient have difficulty dressing or bathing?: No Does the patient have difficulty doing errands alone such as visiting a doctor's office or shopping?: No   Review of Systems  Constitutional: -, -unexpected weight change, -anorexia, -fatigue Allergy: -sneezing, -itching, -congestion Dermatology: denies changing moles, rash, lumps ENT: -runny nose, -ear pain, -sore throat,  Cardiology:  -chest pain, -palpitations, -orthopnea, Respiratory: -cough, -shortness of breath, -dyspnea on exertion, -wheezing,  Gastroenterology: -abdominal pain, -nausea, -vomiting, -diarrhea, -constipation, -dysphagia Hematology: -bleeding or bruising problems Musculoskeletal: -arthralgias, -myalgias, -joint swelling, -back pain, - Ophthalmology: -vision changes,  Urology: -dysuria, -difficulty urinating,  -urinary frequency, -urgency, incontinence Neurology: -, -numbness, , -memory loss, -falls, -dizziness    PHYSICAL EXAM:  BP (!) 170/108   Pulse 72   Ht 5\' 8"  (1.727 m)   Wt 231 lb (104.8 kg)   BMI 35.12 kg/m   General Appearance: Alert, cooperative, no distress, appears stated age Head: Normocephalic, without obvious abnormality, atraumatic Eyes: PERRL, conjunctiva/corneas clear, EOM's intact, fundi benign Ears: Normal TM's and external ear canals Nose: Nares normal, mucosa normal, no drainage or sinus  tenderness Throat: Lips, mucosa, and tongue normal; teeth and gums normal Neck: Supple, no  lymphadenopathy, thyroid:no enlargement/tenderness/nodules; no carotid bruit or JVD Lungs: Clear to auscultation bilaterally without wheezes, rales or ronchi; respirations unlabored Heart: Regular rate and rhythm, S1 and S2 normal, no murmur, rub or gallop Abdomen: Soft, non-tender, nondistended, normoactive bowel sounds, no masses, no hepatosplenomegaly Extremities: No clubbing, cyanosis or edema Pulses: 2+ and symmetric all extremities Skin: Skin color, texture, turgor normal, no rashes or lesions Lymph nodes: Cervical, supraclavicular, and axillary nodes normal Neurologic: CNII-XII intact, normal strength, sensation and gait; reflexes 2+ and symmetric throughout   Psych: Normal mood, affect, hygiene and grooming Gentle exam shows absence of left testing. ASSESSMENT/PLAN: Essential hypertension - Plan: ramipril (ALTACE) 5 MG capsule, CBC with Differential/Platelet, Comprehensive metabolic panel  Need for influenza vaccination - Plan: Flu vaccine HIGH DOSE PF (Fluzone High dose)  Elevated PSA - Plan: PSA  Arthritis  Hyperlipidemia, unspecified hyperlipidemia type - Plan: Lipid panel  Hemochromatosis, hereditary ([place]) - Plan: CBC with Differential/Platelet, Comprehensive metabolic panel, Lipid panel, Ferritin, Iron  Obesity (BMI 30-39.9)  Vitamin D deficiency - Plan: VITAMIN D 25 Hydroxy (Vit-D Deficiency, Fractures)  Need for vaccination against Streptococcus pneumoniae - Plan: Pneumococcal conjugate vaccine 13-valent  Glucose intolerance (impaired glucose tolerance)  History of orchiectomy, unilateral  Chronic musculoskeletal pain     Discussed PSA screening (risks/benefits), recommended at least 30 minutes of aerobic activity at least 5 days/week;  healthy diet and alcohol recommendations (less than or equal to 2 drinks/day)  . Immunization recommendations discussed.  Colonoscopy recommendations reviewed.   Medicare Attestation I have personally reviewed: The patient's  medical and social history Their use of alcohol, tobacco or illicit drugs Their current medications and supplements The patient's functional ability including ADLs,fall risks, home safety risks, cognitive, and hearing and visual impairment Diet and physical activities Evidence for depression or mood disorders  The patient's weight, height, and BMI have been recorded in the chart.  I have made referrals, counseling, and provided education to the patient based on review of the above and I have provided the patient with a written personalized care plan for preventive services.   To return here in one month for blood pressure recheck. Encouraged him to stay on his blood pressure medication regularly.  [name], MD   07/29/2017

## 2017-07-29 NOTE — Progress Notes (Signed)
   Subjective:    Patient ID: Jesse Mcclure, male    DOB: 25-Aug-1951, 66 y.o.   MRN: [id number]  HPI    Review of Systems     Objective:   Physical Exam        Assessment & Plan:

## 2017-07-29 NOTE — Patient Instructions (Addendum)
20 minutes of something physical daily Back on carbohydrates specifically white foods Set a waist size  Jesse Mcclure , Thank you for taking time to come for your Medicare Wellness Visit. I appreciate your ongoing commitment to your health goals. Please review the following plan we discussed and let me know if I can assist you in the future.   These are the goals we discussed: Goals    None      This is a list of the screening recommended for you and due dates:  Health Maintenance  Topic Date Due  . HIV Screening  09/09/1966  . Pneumonia vaccines (1 of 2 - PCV13) 09/09/2016  . Flu Shot  05/05/2017  . Tetanus Vaccine  10/28/2022  . Colon Cancer Screening  10/29/2024  .  Hepatitis C: One time screening is recommended by Center for Disease Control  (CDC) for  adults born from [birth year] through 1965.   Completed

## 2017-07-30 LAB — COMPREHENSIVE METABOLIC PANEL
AG Ratio: 1.6 (calc) (ref 1.0–2.5)
ALT: 30 U/L (ref 9–46)
AST: 23 U/L (ref 10–35)
Albumin: 4.1 g/dL (ref 3.6–5.1)
Alkaline phosphatase (APISO): 58 U/L (ref 40–115)
BUN: 18 mg/dL (ref 7–25)
CO2: 25 mmol/L (ref 20–32)
Calcium: 8.8 mg/dL (ref 8.6–10.3)
Chloride: 103 mmol/L (ref 98–110)
Creat: 0.91 mg/dL (ref 0.70–1.25)
Globulin: 2.6 g/dL (calc) (ref 1.9–3.7)
Glucose, Bld: 105 mg/dL — ABNORMAL HIGH (ref 65–99)
Potassium: 4.4 mmol/L (ref 3.5–5.3)
Sodium: 136 mmol/L (ref 135–146)
Total Bilirubin: 1.3 mg/dL — ABNORMAL HIGH (ref 0.2–1.2)
Total Protein: 6.7 g/dL (ref 6.1–8.1)

## 2017-07-30 LAB — VITAMIN D 25 HYDROXY (VIT D DEFICIENCY, FRACTURES): Vit D, 25-Hydroxy: 37 ng/mL (ref 30–100)

## 2017-07-30 LAB — LIPID PANEL
Cholesterol: 186 mg/dL (ref <200)
HDL: 51 mg/dL (ref >40)
LDL Cholesterol (Calc): 115 mg/dL (calc) — ABNORMAL HIGH
Non-HDL Cholesterol (Calc): 135 mg/dL (calc) — ABNORMAL HIGH (ref <130)
Total CHOL/HDL Ratio: 3.6 (calc) (ref <5.0)
Triglycerides: 96 mg/dL (ref <150)

## 2017-07-30 LAB — CBC WITH DIFFERENTIAL/PLATELET
Basophils Absolute: 48 cells/uL (ref 0–200)
Basophils Relative: 0.9 %
Eosinophils Absolute: 138 cells/uL (ref 15–500)
Eosinophils Relative: 2.6 %
HCT: 45.3 % (ref 38.5–50.0)
Hemoglobin: 15.8 g/dL (ref 13.2–17.1)
Lymphs Abs: 1680 cells/uL (ref 850–3900)
MCH: 33.3 pg — ABNORMAL HIGH (ref 27.0–33.0)
MCHC: 34.9 g/dL (ref 32.0–36.0)
MCV: 95.6 fL (ref 80.0–100.0)
MPV: 11.7 fL (ref 7.5–12.5)
Monocytes Relative: 14.1 %
Neutro Abs: 2687 cells/uL (ref 1500–7800)
Neutrophils Relative %: 50.7 %
Platelets: 223 10*3/uL (ref 140–400)
RBC: 4.74 10*6/uL (ref 4.20–5.80)
RDW: 11.7 % (ref 11.0–15.0)
Total Lymphocyte: 31.7 %
WBC mixed population: 747 cells/uL (ref 200–950)
WBC: 5.3 10*3/uL (ref 3.8–10.8)

## 2017-07-30 LAB — FERRITIN: Ferritin: 261 ng/mL (ref 20–380)

## 2017-07-30 LAB — PSA: PSA: 3.5 ng/mL (ref <=4.0)

## 2017-07-30 LAB — IRON: Iron: 223 ug/dL — ABNORMAL HIGH (ref 50–180)

## 2017-08-09 DIAGNOSIS — H52203 Unspecified astigmatism, bilateral: Secondary | ICD-10-CM | POA: Diagnosis not present

## 2017-08-09 DIAGNOSIS — H02052 Trichiasis without entropian right lower eyelid: Secondary | ICD-10-CM | POA: Diagnosis not present

## 2017-08-09 DIAGNOSIS — Z961 Presence of intraocular lens: Secondary | ICD-10-CM | POA: Diagnosis not present

## 2017-08-09 DIAGNOSIS — H43813 Vitreous degeneration, bilateral: Secondary | ICD-10-CM | POA: Diagnosis not present

## 2017-08-10 DIAGNOSIS — D485 Neoplasm of uncertain behavior of skin: Secondary | ICD-10-CM | POA: Diagnosis not present

## 2017-08-10 DIAGNOSIS — D225 Melanocytic nevi of trunk: Secondary | ICD-10-CM | POA: Diagnosis not present

## 2017-08-10 DIAGNOSIS — L821 Other seborrheic keratosis: Secondary | ICD-10-CM | POA: Diagnosis not present

## 2017-08-31 ENCOUNTER — Ambulatory Visit (INDEPENDENT_AMBULATORY_CARE_PROVIDER_SITE_OTHER): Payer: Medicare Other | Admitting: Family Medicine

## 2017-08-31 ENCOUNTER — Encounter: Payer: Self-pay | Admitting: Family Medicine

## 2017-08-31 VITALS — BP 170/90 | HR 73 | Resp 18 | Wt 235.8 lb

## 2017-08-31 DIAGNOSIS — I10 Essential (primary) hypertension: Secondary | ICD-10-CM

## 2017-08-31 MED ORDER — LISINOPRIL-HYDROCHLOROTHIAZIDE 20-12.5 MG PO TABS: 1.0000 | ORAL_TABLET | Freq: Every day | ORAL | 3 refills | Status: DC

## 2017-08-31 NOTE — Patient Instructions (Signed)
20 minutes of something physical every day, check out the DASH diet

## 2017-08-31 NOTE — Progress Notes (Signed)
   Subjective:    Patient ID: Jesse Mcclure, male    DOB: Mar 09, 1951, 66 y.o.   MRN: [id number]  HPI He is here for a recheck on his blood pressure.  He does walk the dog twice per day but admits to not maintaining any particular pace.  He does work out twice per week but usually this is for strength training.  He continues on Altace and has had no difficulty with that.   Review of Systems     Objective:   Physical Exam Alert and in no distress.  Blood pressure is recorded       Assessment & Plan:  Essential hypertension - Plan: lisinopril-hydrochlorothiazide (ZESTORETIC) 20-12.5 MG tablet  I will switch him to Zestoretic to improve his blood pressure.  Discussed diet and exercise with him in detail.  He expressed the idea of him doing 20 minutes a day of something physical.  Also discussed the DASH diet with him.  Recheck here in 1-2 months.  He is also to bring his blood pressure cuff in with him.

## 2017-10-13 ENCOUNTER — Ambulatory Visit (INDEPENDENT_AMBULATORY_CARE_PROVIDER_SITE_OTHER): Payer: Medicare Other | Admitting: Family Medicine

## 2017-10-13 ENCOUNTER — Encounter: Payer: Self-pay | Admitting: Family Medicine

## 2017-10-13 VITALS — BP 130/88 | HR 84 | Ht 68.0 in | Wt 228.8 lb

## 2017-10-13 DIAGNOSIS — E669 Obesity, unspecified: Secondary | ICD-10-CM

## 2017-10-13 DIAGNOSIS — I10 Essential (primary) hypertension: Secondary | ICD-10-CM | POA: Diagnosis not present

## 2017-10-13 NOTE — Progress Notes (Signed)
   Subjective:    Patient ID: Jesse Mcclure, male    DOB: Nov 24, 1950, 67 y.o.   MRN: [id number]  HPI He is here for recheck.  He has increased his exercise and is made some changes in his diet.  This is reflected in his weight loss.  He continues on his lisinopril/HCTZ.   Review of Systems     Objective:   Physical Exam Alert and in no distress.  Blood pressure is recorded.  Although he is not at goal, I will maintain his present regimen      Assessment & Plan:  Essential hypertension  Obesity (BMI 30-39.9) And encouraged him to continue with his diet and exercise.  Discussed the benefits of weight loss in regard to blood pressure, risk for diabetes, heart disease etc.  Recheck here in several months.

## 2018-02-17 ENCOUNTER — Encounter: Payer: Self-pay | Admitting: Family Medicine

## 2018-02-17 ENCOUNTER — Ambulatory Visit (INDEPENDENT_AMBULATORY_CARE_PROVIDER_SITE_OTHER): Payer: Medicare Other | Admitting: Family Medicine

## 2018-02-17 VITALS — BP 126/88 | HR 78 | Temp 97.4°F | Ht 68.0 in | Wt 224.8 lb

## 2018-02-17 DIAGNOSIS — I10 Essential (primary) hypertension: Secondary | ICD-10-CM

## 2018-02-17 DIAGNOSIS — R7302 Impaired glucose tolerance (oral): Secondary | ICD-10-CM

## 2018-02-17 DIAGNOSIS — R972 Elevated prostate specific antigen [PSA]: Secondary | ICD-10-CM | POA: Diagnosis not present

## 2018-02-17 LAB — POCT GLYCOSYLATED HEMOGLOBIN (HGB A1C): Hemoglobin A1C: 5.3

## 2018-02-17 NOTE — Progress Notes (Addendum)
  Subjective:  He is here for a follow-up visit.  He does have a history of hereditary hemochromatosis.  He also has a history of impaired fasting glucose and hypertension.  He does also have a previous history of elevated PSA that we have been monitoring.  He is now doing much better job of taking care of himself in terms of diet and exercise.       Objective:    Physical Exam Blood pressure and weight are recorded. Hemoglobin A1c is 5.3    Assessment & Plan:    Elevated PSA - Plan: PSA  Essential hypertension  Glucose intolerance (impaired glucose tolerance) - Plan: HgB A1c  Hemochromatosis, hereditary ([place]) - Plan: Ferritin, Iron He has lost several pounds and I complemented him on this.  We will do follow-up concerning his PSA.,  Hemochromatosis.  Continue on his present blood pressure medication.  02/21/18 I discussed the results with him in regard to his iron and ferritin.  Also discussed his PSA.  I told him that a percentage of this being potentially cancer is roughly 25%.  Discussed watchful waiting and rechecking this in 3 to 6 months versus sending him to urology.  He was comfortable with waiting.

## 2018-02-18 LAB — PSA: Prostate Specific Ag, Serum: 5.6 ng/mL — ABNORMAL HIGH (ref 0.0–4.0)

## 2018-02-18 LAB — FERRITIN: Ferritin: 237 ng/mL (ref 30–400)

## 2018-02-18 LAB — IRON: Iron: 147 ug/dL (ref 38–169)

## 2018-02-19 LAB — PSA, TOTAL AND FREE
PSA, Free Pct: 14.8 %
PSA, Free: 0.8 ng/mL
Prostate Specific Ag, Serum: 5.4 ng/mL — ABNORMAL HIGH (ref 0.0–4.0)

## 2018-02-19 LAB — SPECIMEN STATUS REPORT

## 2018-04-28 ENCOUNTER — Ambulatory Visit (INDEPENDENT_AMBULATORY_CARE_PROVIDER_SITE_OTHER): Payer: Medicare Other | Admitting: Family Medicine

## 2018-04-28 ENCOUNTER — Encounter: Payer: Self-pay | Admitting: Family Medicine

## 2018-04-28 VITALS — BP 136/82 | HR 74 | Temp 98.1°F | Wt 228.4 lb

## 2018-04-28 DIAGNOSIS — J209 Acute bronchitis, unspecified: Secondary | ICD-10-CM | POA: Diagnosis not present

## 2018-04-28 MED ORDER — LEVOFLOXACIN 500 MG PO TABS: 500.0000 mg | ORAL_TABLET | Freq: Every day | ORAL | 0 refills | Status: DC

## 2018-04-28 NOTE — Progress Notes (Signed)
  Subjective:     Patient ID: Jesse Mcclure, male   DOB: 09/16/1951, 67 y.o.   MRN: [id number] Chief Complaint  Patient presents with  . other    congestion, brochitis. finished zpack went away and came back.     HPI  Patient presents with complaint of a cough. Symptoms began 3 weeks ago. The cough is productive with green/clear mucous and is worse at night. Associated symptoms include shortness of breath with walking, wheezing at night, and voice changes. Patient has traveled recently and was treated with a 5 day dose-pack of azithromycin for acute bronchitis. Has tried 800mg  ibuprofen (last dose today), corsedin and tussin.  Patient does not have a history of smoking, asthma or environmental allergens or acid reflux. No acid reflux, fevers, chills, chest pain.   Constitutional: Positive for fatigue. Negative for chills and fever.  HENT: Negative for congestion, ear pain, postnasal drip, rhinorrhea, sinus pressure and sneezing.   Eyes: Negative for discharge and itching.  Respiratory: Positive for cough, shortness of breath and wheezing.   Cardiovascular: Negative for chest pain.     Objective:   Physical Exam  Constitutional: He appears well-developed and well-nourished.  HENT:  Nose: No mucosal edema or rhinorrhea.  Mouth/Throat: Oropharynx is clear and moist.  Eyes: EOM are normal.  Cardiovascular: Normal rate and regular rhythm.  Pulmonary/Chest: No stridor. No respiratory distress. He has no decreased breath sounds. He has no rhonchi, rales or wheezing. No wheezing with forced expiration.   BP 136/82 (BP Location: Left Arm, Patient Position: Sitting)   Pulse 74   Temp 98.1 F (36.7 C)   Wt 228 lb 6.4 oz (103.6 kg)   SpO2 94%   BMI 34.73 kg/m      Assessment:    Acute bronchitis, unspecified organism - Plan: levofloxacin (LEVAQUIN) 500 MG tablet       Plan:Marland Kitchen     Acute bronchitis, unspecified organism - Plan: levofloxacin (LEVAQUIN) 500 MG tablet He will call me  if not entirely better and I will consider an x-ray and blood work.

## 2018-06-27 DIAGNOSIS — Z23 Encounter for immunization: Secondary | ICD-10-CM | POA: Diagnosis not present

## 2018-06-28 DIAGNOSIS — M1712 Unilateral primary osteoarthritis, left knee: Secondary | ICD-10-CM | POA: Diagnosis not present

## 2018-06-29 ENCOUNTER — Other Ambulatory Visit: Payer: Self-pay

## 2018-06-29 ENCOUNTER — Encounter (HOSPITAL_COMMUNITY): Payer: Self-pay | Admitting: Emergency Medicine

## 2018-06-29 ENCOUNTER — Emergency Department (HOSPITAL_COMMUNITY): Payer: Medicare Other

## 2018-06-29 ENCOUNTER — Emergency Department (HOSPITAL_COMMUNITY)
Admission: EM | Admit: 2018-06-29 | Discharge: 2018-06-29 | Disposition: A | Discharge status: Home / Self Care | Payer: Medicare Other | Attending: Emergency Medicine | Admitting: Emergency Medicine

## 2018-06-29 DIAGNOSIS — Z8612 Personal history of poliomyelitis: Secondary | ICD-10-CM | POA: Diagnosis not present

## 2018-06-29 DIAGNOSIS — I10 Essential (primary) hypertension: Secondary | ICD-10-CM | POA: Insufficient documentation

## 2018-06-29 DIAGNOSIS — M1712 Unilateral primary osteoarthritis, left knee: Secondary | ICD-10-CM | POA: Diagnosis not present

## 2018-06-29 DIAGNOSIS — Z79899 Other long term (current) drug therapy: Secondary | ICD-10-CM | POA: Insufficient documentation

## 2018-06-29 DIAGNOSIS — M25562 Pain in left knee: Secondary | ICD-10-CM | POA: Diagnosis not present

## 2018-06-29 DIAGNOSIS — M222X2 Patellofemoral disorders, left knee: Secondary | ICD-10-CM | POA: Diagnosis not present

## 2018-06-29 MED ORDER — ACETAMINOPHEN 325 MG PO TABS
650.0000 mg | ORAL_TABLET | Freq: Once | ORAL | Status: AC
  Administered 2018-06-29: 650 mg via ORAL
  Filled 2018-06-29: qty 2

## 2018-06-29 NOTE — Discharge Instructions (Addendum)
Follow-up with orthopedics.  Your x-ray was reassuring, shows arthritis but no broken bones.  Ice the knee when you have time.  Elevate the knee.  Use the knee sleeve for support.  He can also add Tylenol to help with pain and continue taking Celebrex.  Please schedule an appointment with your regular doctor to have your blood pressure rechecked, it was high in the ER today.

## 2018-06-29 NOTE — ED Notes (Signed)
Bed: WTR6 Expected date:  Expected time:  Means of arrival:  Comments: 

## 2018-06-29 NOTE — ED Provider Notes (Signed)
[facility] DEPT Provider Note   CSN: [id number] Arrival date & time: 06/29/18  1047     History   Chief Complaint Chief Complaint  Patient presents with  . Knee Pain    HPI Jesse Mcclure. is a 67 y.o. male.  HPI   Jesse Mcclure is a 67 year old male with a history of left knee osteoarthritis, hypertension, hyperlipidemia who presents to the emergency department for evaluation of left knee pain.  Patient reports that he has been using the leg press at the gym recently.  Was seen by [facility] orthopedics yesterday where he had an x-ray of the left knee which showed osteoarthritic changes.  Patient reports that he was started on Celebrex yesterday.  This morning when he was in his kitchen he turned to the right quickly and suddenly felt a popping sensation in his left knee which was very painful.  He reports that he has worsened pain over the back of the knee as well as a generally over the patella.  He reports pain feels throbbing and is worsened with knee flexion as well as weightbearing.  He has been using a cane to assist with ambulation.  He has not had any popping or instability since.  He denies fever/chills, numbness, weakness, knee swelling, arthralgias elsewhere.  No calf swelling or tenderness.  He took his blood pressure medication just minutes before entering the ER.  No chest pain, shortness of breath, visual disturbance, changes in urination, headache.   Past Medical History:  Diagnosis Date  . BPH (benign prostatic hypertrophy)   . Degenerative disc disease   . Diverticulosis   . Hemochromatosis   . HTN (hypertension)   . Hyperlipidemia   . Polio   . Status post Nissen fundoplication (without gastrostomy tube) procedure    for hiatal hernia.    Patient Active Problem List   Diagnosis Date Noted  . Glucose intolerance (impaired glucose tolerance) 05/20/2016  . Vitamin D deficiency 05/20/2016  . Chronic musculoskeletal pain  01/09/2016  . History of orchiectomy, unilateral 05/20/2015  . Obesity (BMI 30-39.9) 05/20/2015  . Elevated PSA 05/20/2015  . Arthritis 05/20/2015  . Essential hypertension 03/02/2012  . Hyperlipidemia 03/02/2012  . Hemochromatosis, hereditary ([place]) 11/28/2008    Past Surgical History:  Procedure Laterality Date  . FOOT SURGERY    . HIATAL HERNIA REPAIR    . OTHER SURGICAL HISTORY     Cataract Surgery--Both eyes  . Undescended testes Right 1968        Home Medications    Prior to Admission medications   Medication Sig Start Date End Date Taking? Authorizing Provider  ibuprofen (ADVIL,MOTRIN) 200 MG tablet Take 400 mg by mouth every 8 (eight) hours as needed.     [provider]  levofloxacin (LEVAQUIN) 500 MG tablet Take 1 tablet (500 mg total) by mouth daily. 04/28/18   Denita Lung, MD  lisinopril-hydrochlorothiazide (ZESTORETIC) 20-12.5 MG tablet Take 1 tablet by mouth daily. 08/31/17   Denita Lung, MD    Family History Family History  Problem Relation Age of Onset  . Breast cancer Mother   . Breast cancer Sister     Social History Social History   Tobacco Use  . Smoking status: Never Smoker  . Smokeless tobacco: Never Used  Substance Use Topics  . Alcohol use: Yes    Alcohol/week: 4.0 standard drinks    Types: 4 drink(s) per week    Comment: 3-4 drinks per week.   Marland Kitchen  Drug use: No     Allergies   Patient has no known allergies.   Review of Systems Review of Systems  Constitutional: Negative for chills and fever.  Eyes: Negative for visual disturbance.  Respiratory: Negative for shortness of breath.   Cardiovascular: Negative for chest pain.  Gastrointestinal: Negative for abdominal pain and vomiting.  Musculoskeletal: Positive for arthralgias (left knee pain). Negative for gait problem.  Skin: Negative for color change.  Neurological: Negative for weakness, numbness and headaches.     Physical Exam Updated Vital Signs BP (!)  159/99 (BP Location: Right Arm)   Pulse (!) 103   Temp 98 F (36.7 C) (Oral)   Resp 18   SpO2 98%   Physical Exam  Constitutional: He is oriented to person, place, and time. He appears well-developed and well-nourished. No distress.  HENT:  Head: Normocephalic and atraumatic.  Eyes: Right eye exhibits no discharge. Left eye exhibits no discharge.  Pulmonary/Chest: Effort normal. No respiratory distress.  Musculoskeletal:  Left knee with tenderness over the lateral aspect of the patella as well as posterior aspect of the knee joint. No joint effusion or swelling appreciated. No bruising, erythema or warmth overlaying the joint. Full flexion/extension with crepitus on flexion. No joint line tenderness.  No abnormal alignment or patellar mobility. No varus/valgus laxity. Negative drawer's. 2+ DP pulses bilaterally. All compartments are soft. No calf swelling. Sensation intact distal to injury.  Neurological: He is alert and oriented to person, place, and time. Coordination normal.  Skin: Skin is warm and dry. He is not diaphoretic.  Psychiatric: He has a normal mood and affect. His behavior is normal.  Nursing note and vitals reviewed.   ED Treatments / Results  Labs (all labs ordered are listed, but only abnormal results are displayed) Labs Reviewed - No data to display  EKG None  Radiology Dg Knee Complete 4 Views Left  Result Date: 06/29/2018 CLINICAL DATA:  Left knee pain for several weeks. EXAM: LEFT KNEE - COMPLETE 4+ VIEW COMPARISON:  None. FINDINGS: No evidence of fracture, dislocation, or joint effusion. Moderate narrowing of patellofemoral space is noted. Medial and lateral joint spaces are unremarkable. Soft tissues are unremarkable. IMPRESSION: Moderate degenerative joint disease of patellofemoral space. No acute abnormality seen in the left knee. Electronically Signed   By: Marijo Conception, M.D.   On: 06/29/2018 12:05    Procedures Procedures (including critical care  time)  Medications Ordered in ED Medications  acetaminophen (TYLENOL) tablet 650 mg (has no administration in time range)     Initial Impression / Assessment and Plan / ED Course  I have reviewed the triage vital signs and the nursing notes.  Pertinent labs & imaging results that were available during my care of the patient were reviewed by me and considered in my medical decision making (see chart for details).     Presents with left knee pain after hearing a pop when twisting earlier today.  On exam left lower extremity is neurovascularly intact.  No erythema, warmth, joint effusion or signs of infectious process.  X-ray reveals patellofemoral arthritis which is consistent with exam findings of crepitus on flexion.  No varus/valgus laxity and negative drawers, I have low concern for ligamentous injury.  Plan to discharge with instructions to follow-up with orthopedics.  His blood pressure was elevated in the ER today, likely related to just not taking his blood pressure medication.  I have counseled him to follow-up with his PCP for blood pressure recheck and  management.  Patient agrees and appears reliable.  Final Clinical Impressions(s) / ED Diagnoses   Final diagnoses:  Patellofemoral arthritis of left knee    ED Discharge Orders    None       Bernarda Caffey 06/29/18 1259    Carmin Muskrat, MD 07/01/18 0021

## 2018-06-29 NOTE — ED Notes (Signed)
ED Provider at bedside. 

## 2018-06-29 NOTE — ED Triage Notes (Signed)
Has been seeing ortho for left knee pain; heard a pop at site while turning this morning.

## 2018-07-19 ENCOUNTER — Encounter: Payer: Self-pay | Admitting: Family Medicine

## 2018-07-19 ENCOUNTER — Ambulatory Visit (INDEPENDENT_AMBULATORY_CARE_PROVIDER_SITE_OTHER): Payer: Medicare Other | Admitting: Family Medicine

## 2018-07-19 VITALS — BP 156/100 | HR 94 | Temp 97.9°F | Wt 237.2 lb

## 2018-07-19 DIAGNOSIS — M1990 Unspecified osteoarthritis, unspecified site: Secondary | ICD-10-CM | POA: Diagnosis not present

## 2018-07-19 MED ORDER — LIDOCAINE HCL (PF) 1 % IJ SOLN
2.0000 mL | Freq: Once | INTRAMUSCULAR | Status: AC
  Administered 2018-07-19: 2 mL via INTRADERMAL

## 2018-07-19 MED ORDER — TRIAMCINOLONE ACETONIDE 40 MG/ML IJ SUSP
40.0000 mg | Freq: Once | INTRAMUSCULAR | Status: AC
  Administered 2018-07-19: 40 mg via INTRAMUSCULAR

## 2018-07-19 NOTE — Progress Notes (Signed)
   Subjective:    Patient ID: Jesse Guest., male    DOB: 1951-06-04, 67 y.o.   MRN: [id number]  HPI He is here for evaluation of left knee arthritis.  He was seen in the recent past by Dr. Jacqulyn Bath and was diagnosed with patellofemoral pain syndrome.  He was placed on Celebrex but states he still having difficulty with knee pain.  The x-ray did show some arthritic changes.  The pain has been getting worse.   Review of Systems     Objective:   Physical Exam Exam of the left knee does show an effusion.  Negative anterior drawer.  Medial and lateral collateral ligaments intact.  McMurray's testing negative.  Patellar compression test was negative.  Tender to palpation mainly at the medial joint line.       Assessment & Plan:  Arthritis - Plan: triamcinolone acetonide (KENALOG-40) injection 40 mg, lidocaine (PF) (XYLOCAINE) 1 % injection 2 mL I discussed options with him concerning this.  Recommend to avoid injection which he is comfortable with.  The knee was prepped with Betadine and the joint line identified.  40 mg of Kenalog and 3 cc of Xylocaine was injected into the joint without difficulty and he did obtain relief.  Discussed repeating this in the future but does state that the more often we have to do that the less likely were to want to do it and need referral for different injections.  He is comfortable with that.

## 2018-08-03 DIAGNOSIS — M1712 Unilateral primary osteoarthritis, left knee: Secondary | ICD-10-CM | POA: Diagnosis not present

## 2018-08-08 DIAGNOSIS — Z961 Presence of intraocular lens: Secondary | ICD-10-CM | POA: Diagnosis not present

## 2018-08-08 DIAGNOSIS — H35371 Puckering of macula, right eye: Secondary | ICD-10-CM | POA: Diagnosis not present

## 2018-08-08 DIAGNOSIS — H43813 Vitreous degeneration, bilateral: Secondary | ICD-10-CM | POA: Diagnosis not present

## 2018-08-10 DIAGNOSIS — M25562 Pain in left knee: Secondary | ICD-10-CM | POA: Diagnosis not present

## 2018-08-15 DIAGNOSIS — S83242A Other tear of medial meniscus, current injury, left knee, initial encounter: Secondary | ICD-10-CM | POA: Diagnosis not present

## 2018-08-24 ENCOUNTER — Encounter: Payer: Self-pay | Admitting: Family Medicine

## 2018-08-24 ENCOUNTER — Ambulatory Visit (INDEPENDENT_AMBULATORY_CARE_PROVIDER_SITE_OTHER): Payer: Medicare Other | Admitting: Family Medicine

## 2018-08-24 VITALS — BP 128/80 | HR 76 | Temp 98.4°F | Wt 234.0 lb

## 2018-08-24 DIAGNOSIS — R972 Elevated prostate specific antigen [PSA]: Secondary | ICD-10-CM | POA: Diagnosis not present

## 2018-08-24 NOTE — Progress Notes (Signed)
   Subjective:    Patient ID: Jesse Mcclure., male    DOB: 1951-01-24, 67 y.o.   MRN: [id number]  HPI Here for recheck on his PSA.  He has had some elevated PSAs in the past and we have elected to monitor the situation.   Review of Systems     Objective:   Physical Exam Alert and in no distress otherwise not examined.       Assessment & Plan:  Elevated PSA - Plan: PSA, total and free I again discussed PSA elevations and monitoring versus more aggressive therapy.  He is comfortable continuing to monitor this.

## 2018-08-25 LAB — PSA, TOTAL AND FREE
PSA, Free Pct: 14 %
PSA, Free: 0.77 ng/mL
Prostate Specific Ag, Serum: 5.5 ng/mL — ABNORMAL HIGH (ref 0.0–4.0)

## 2018-09-08 DIAGNOSIS — M1712 Unilateral primary osteoarthritis, left knee: Secondary | ICD-10-CM | POA: Diagnosis not present

## 2018-09-08 DIAGNOSIS — Y999 Unspecified external cause status: Secondary | ICD-10-CM | POA: Diagnosis not present

## 2018-09-08 DIAGNOSIS — S83232A Complex tear of medial meniscus, current injury, left knee, initial encounter: Secondary | ICD-10-CM | POA: Diagnosis not present

## 2018-09-08 DIAGNOSIS — M94262 Chondromalacia, left knee: Secondary | ICD-10-CM | POA: Diagnosis not present

## 2018-09-08 DIAGNOSIS — X58XXXA Exposure to other specified factors, initial encounter: Secondary | ICD-10-CM | POA: Diagnosis not present

## 2018-09-08 DIAGNOSIS — S83242A Other tear of medial meniscus, current injury, left knee, initial encounter: Secondary | ICD-10-CM | POA: Diagnosis not present

## 2018-09-21 DIAGNOSIS — S83242D Other tear of medial meniscus, current injury, left knee, subsequent encounter: Secondary | ICD-10-CM | POA: Diagnosis not present

## 2018-09-22 ENCOUNTER — Other Ambulatory Visit: Payer: Self-pay | Admitting: Family Medicine

## 2018-09-22 DIAGNOSIS — I10 Essential (primary) hypertension: Secondary | ICD-10-CM

## 2018-10-11 ENCOUNTER — Ambulatory Visit (INDEPENDENT_AMBULATORY_CARE_PROVIDER_SITE_OTHER): Payer: Medicare Other | Admitting: Family Medicine

## 2018-10-11 ENCOUNTER — Encounter: Payer: Self-pay | Admitting: Family Medicine

## 2018-10-11 VITALS — BP 124/80 | HR 102 | Ht 69.5 in | Wt 230.2 lb

## 2018-10-11 DIAGNOSIS — E669 Obesity, unspecified: Secondary | ICD-10-CM | POA: Diagnosis not present

## 2018-10-11 DIAGNOSIS — R7302 Impaired glucose tolerance (oral): Secondary | ICD-10-CM

## 2018-10-11 DIAGNOSIS — G8929 Other chronic pain: Secondary | ICD-10-CM | POA: Diagnosis not present

## 2018-10-11 DIAGNOSIS — E559 Vitamin D deficiency, unspecified: Secondary | ICD-10-CM | POA: Diagnosis not present

## 2018-10-11 DIAGNOSIS — E785 Hyperlipidemia, unspecified: Secondary | ICD-10-CM

## 2018-10-11 DIAGNOSIS — R972 Elevated prostate specific antigen [PSA]: Secondary | ICD-10-CM | POA: Diagnosis not present

## 2018-10-11 DIAGNOSIS — Z23 Encounter for immunization: Secondary | ICD-10-CM

## 2018-10-11 DIAGNOSIS — M7918 Myalgia, other site: Secondary | ICD-10-CM | POA: Diagnosis not present

## 2018-10-11 DIAGNOSIS — M1990 Unspecified osteoarthritis, unspecified site: Secondary | ICD-10-CM

## 2018-10-11 DIAGNOSIS — I10 Essential (primary) hypertension: Secondary | ICD-10-CM | POA: Diagnosis not present

## 2018-10-11 NOTE — Patient Instructions (Signed)
  Jesse Mcclure , Thank you for taking time to come for your Medicare Wellness Visit. I appreciate your ongoing commitment to your health goals. Please review the following plan we discussed and let me know if I can assist you in the future.   These are the goals we discussed: Work on diet and exercise.  He will be referred to medical weight loss and wellness This is a list of the screening recommended for you and due dates:  Health Maintenance  Topic Date Due  . Flu Shot  05/05/2018  . Pneumonia vaccines (2 of 2 - PPSV23) 07/29/2018  . Tetanus Vaccine  10/28/2022  . Colon Cancer Screening  10/29/2024  .  Hepatitis C: One time screening is recommended by Center for Disease Control  (CDC) for  adults born from [birth year] through 1965.   Completed

## 2018-10-11 NOTE — Progress Notes (Signed)
Jesse Mcclure. is a 68 y.o. male who presents for annual wellness visit and follow-up on chronic medical conditions.  He has a history of hereditary hemochromatosis.  His last ferritin was below 500 which is apparently the cut off.  He also has an elevated PSA and we have been monitoring this.  He is comfortable with her present monitoring.  Continues on lisinopril/HCTZ for his blood pressure.  He also has a previous history of glucose intolerance.  Does have some arthritis and recently did have left knee surgery.  He also has a history of hyperlipidemia but presently is on no medication.  Also has a previous history of vitamin D deficiency.  He and his partner getting ready to move to a new house.  He has started a exercise program as well as made some dietary modifications.   Immunizations and Health Maintenance Immunization History  Administered Date(s) Administered  . Influenza Split 07/05/2012  . Influenza Whole 07/11/2013, 07/31/2016  . Influenza, High Dose Seasonal PF 07/29/2017  . Influenza,inj,Quad PF,6+ Mos 05/22/2014  . Influenza-Unspecified 08/11/2017  . Pneumococcal Conjugate-13 07/29/2017  . Tdap 10/28/2012  . Zoster 05/16/2014  . Zoster Recombinat (Shingrix) 08/14/2017   Health Maintenance Due  Topic Date Due  . INFLUENZA VACCINE  05/05/2018  . PNA vac Low Risk Adult (2 of 2 - PPSV23) 07/29/2018    Last colonoscopy: 2016  Last PSA: may 2019  Dentist: twice a year Ophtho: Dr. Shon Hough Exercise: gym- twice a week  Other doctors caring for patient include:  Dentist- Dr.  Marina Gravel- Dr. Jenny Reichmann [place]- surgery Ortho- Almedia Balls- Non-surgery  Advanced Directives:yes info asked for    Depression screen:  See questionnaire below.     Depression screen [facility] 2/9 10/11/2018 08/24/2018 07/29/2017 05/20/2015 05/16/2014  Decreased Interest 0 0 0 0 0  Down, Depressed, Hopeless 0 0 0 0 0  PHQ - 2 Score 0 0 0 0 0    Fall Screen: See Questionaire below.   Fall  Risk  10/11/2018 08/24/2018 07/29/2017 05/20/2015 05/16/2014  Falls in the past year? 0 0 No No No  Number falls in past yr: 0 - - - -  Injury with Fall? 0 - - - -    ADL screen:  See questionnaire below.  Functional Status Survey: Is the patient deaf or have difficulty hearing?: No Does the patient have difficulty seeing, even when wearing glasses/contacts?: No Does the patient have difficulty concentrating, remembering, or making decisions?: No Does the patient have difficulty walking or climbing stairs?: Yes(knee surgery recently) Does the patient have difficulty dressing or bathing?: No Does the patient have difficulty doing errands alone such as visiting a doctor's office or shopping?: No    Review of Systems  Constitutional: -, -unexpected weight change, -anorexia, -fatigue Allergy: -sneezing, -itching, -congestion Dermatology: denies changing moles, rash, lumps ENT: -runny nose, -ear pain, -sore throat,  Cardiology:  -chest pain, -palpitations, -orthopnea, Respiratory: -cough, -shortness of breath, -dyspnea on exertion, -wheezing,  Gastroenterology: -abdominal pain, -nausea, -vomiting, -diarrhea, -constipation, -dysphagia Hematology: -bleeding or bruising problems Musculoskeletal: -arthralgias, -myalgias, -joint swelling, -back pain, - Ophthalmology: -vision changes,  Urology: -dysuria, -difficulty urinating,  -urinary frequency, -urgency, incontinence Neurology: -, -numbness, , -memory loss, -falls, -dizziness    PHYSICAL EXAM:  BP 124/80   Pulse (!) 102   Ht 5' 9.5" (1.765 m)   Wt 230 lb 3.2 oz (104.4 kg)   BMI 33.51 kg/m   General Appearance: Alert, cooperative, no distress, appears stated age  Head: Normocephalic, without obvious abnormality, atraumatic Eyes: PERRL, conjunctiva/corneas clear, EOM's intact, fundi benign Ears: Normal TM's and external ear canals Nose: Nares normal, mucosa normal, no drainage or sinus   tenderness Throat: Lips, mucosa, and tongue  normal; teeth and gums normal Neck: Supple, no lymphadenopathy, thyroid:no enlargement/tenderness/nodules; no carotid bruit or JVD Lungs: Clear to auscultation bilaterally without wheezes, rales or ronchi; respirations unlabored Heart: Regular rate and rhythm, S1 and S2 normal, no murmur, rub or gallop Abdomen: Soft, non-tender, nondistended, normoactive bowel sounds, no masses, no hepatosplenomegaly Extremities: No clubbing, cyanosis or edema Pulses: 2+ and symmetric all extremities Skin: Skin color, texture, turgor normal, no rashes or lesions Lymph nodes: Cervical, supraclavicular, and axillary nodes normal Neurologic: CNII-XII intact, normal strength, sensation and gait; reflexes 2+ and symmetric throughout   Psych: Normal mood, affect, hygiene and grooming  ASSESSMENT/PLAN: Obesity (BMI 30-39.9) - Plan: CBC with Differential/Platelet, Comprehensive metabolic panel, Lipid panel, Amb Ref to Medical Weight Management  Arthritis  Chronic musculoskeletal pain  Elevated PSA  Essential hypertension - Plan: CBC with Differential/Platelet, Comprehensive metabolic panel, Amb Ref to Medical Weight Management  Glucose intolerance (impaired glucose tolerance) - Plan: CBC with Differential/Platelet, Comprehensive metabolic panel, Lipid panel, Amb Ref to Medical Weight Management  Hemochromatosis, hereditary ([place]) - Plan: Ferritin  Hyperlipidemia, unspecified hyperlipidemia type - Plan: Lipid panel, Amb Ref to Medical Weight Management  Vitamin D deficiency - Plan: VITAMIN D 25 Hydroxy (Vit-D Deficiency, Fractures)  Need for vaccination against Streptococcus pneumoniae - Plan: Pneumococcal polysaccharide vaccine 23-valent greater than or equal to 2yo subcutaneous/IM Discussed diet and exercise with him in detail as well as his risk of diabetes.  I will refer him to medical weight loss and wellness.  He is amenable to this.  Discussed setting a proper goal of possibly a waist size of  34. Discussed PSA screening (risks/benefits), recommended at least 30 minutes of aerobic activity at least 5 days/week; healthy diet and alcohol recommendations (less than or equal to 2 drinks/day) reviewed;  Immunization recommendations discussed.  Colonoscopy recommendations reviewed.   Medicare Attestation I have personally reviewed: The patient's medical and social history Their use of alcohol, tobacco or illicit drugs Their current medications and supplements The patient's functional ability including ADLs,fall risks, home safety risks, cognitive, and hearing and visual impairment Diet and physical activities Evidence for depression or mood disorders  The patient's weight, height, and BMI have been recorded in the chart.  I have made referrals, counseling, and provided education to the patient based on review of the above and I have provided the patient with a written personalized care plan for preventive services.     Jill Alexanders, MD   10/11/2018

## 2018-10-12 LAB — COMPREHENSIVE METABOLIC PANEL
ALT: 43 IU/L (ref 0–44)
AST: 30 IU/L (ref 0–40)
Albumin/Globulin Ratio: 1.9 (ref 1.2–2.2)
Albumin: 4.7 g/dL (ref 3.6–4.8)
Alkaline Phosphatase: 73 IU/L (ref 39–117)
BUN/Creatinine Ratio: 25 — ABNORMAL HIGH (ref 10–24)
BUN: 23 mg/dL (ref 8–27)
Bilirubin Total: 1.2 mg/dL (ref 0.0–1.2)
CO2: 23 mmol/L (ref 20–29)
Calcium: 9.4 mg/dL (ref 8.6–10.2)
Chloride: 100 mmol/L (ref 96–106)
Creatinine, Ser: 0.93 mg/dL (ref 0.76–1.27)
GFR calc Af Amer: 98 mL/min/{1.73_m2} (ref >59)
GFR calc non Af Amer: 85 mL/min/{1.73_m2} (ref >59)
Globulin, Total: 2.5 g/dL (ref 1.5–4.5)
Glucose: 102 mg/dL — ABNORMAL HIGH (ref 65–99)
Potassium: 4.7 mmol/L (ref 3.5–5.2)
Sodium: 139 mmol/L (ref 134–144)
Total Protein: 7.2 g/dL (ref 6.0–8.5)

## 2018-10-12 LAB — VITAMIN D 25 HYDROXY (VIT D DEFICIENCY, FRACTURES): Vit D, 25-Hydroxy: 31.7 ng/mL (ref 30.0–100.0)

## 2018-10-12 LAB — CBC WITH DIFFERENTIAL/PLATELET
Basophils Absolute: 0 10*3/uL (ref 0.0–0.2)
Basos: 1 %
EOS (ABSOLUTE): 0.1 10*3/uL (ref 0.0–0.4)
Eos: 2 %
Hematocrit: 47.1 % (ref 37.5–51.0)
Hemoglobin: 16.4 g/dL (ref 13.0–17.7)
Immature Grans (Abs): 0 10*3/uL (ref 0.0–0.1)
Immature Granulocytes: 0 %
Lymphocytes Absolute: 1.8 10*3/uL (ref 0.7–3.1)
Lymphs: 31 %
MCH: 33.9 pg — ABNORMAL HIGH (ref 26.6–33.0)
MCHC: 34.8 g/dL (ref 31.5–35.7)
MCV: 97 fL (ref 79–97)
Monocytes Absolute: 0.8 10*3/uL (ref 0.1–0.9)
Monocytes: 14 %
Neutrophils Absolute: 3.2 10*3/uL (ref 1.4–7.0)
Neutrophils: 52 %
Platelets: 302 10*3/uL (ref 150–450)
RBC: 4.84 x10E6/uL (ref 4.14–5.80)
RDW: 12.4 % (ref 11.6–15.4)
WBC: 6 10*3/uL (ref 3.4–10.8)

## 2018-10-12 LAB — FERRITIN: Ferritin: 764 ng/mL — ABNORMAL HIGH (ref 30–400)

## 2018-10-12 LAB — LIPID PANEL
Chol/HDL Ratio: 4.5 ratio (ref 0.0–5.0)
Cholesterol, Total: 204 mg/dL — ABNORMAL HIGH (ref 100–199)
HDL: 45 mg/dL (ref >39)
LDL Calculated: 137 mg/dL — ABNORMAL HIGH (ref 0–99)
Triglycerides: 109 mg/dL (ref 0–149)
VLDL Cholesterol Cal: 22 mg/dL (ref 5–40)

## 2018-10-18 ENCOUNTER — Telehealth: Payer: Self-pay | Admitting: *Deleted

## 2018-10-18 ENCOUNTER — Telehealth: Payer: Self-pay | Admitting: Hematology and Oncology

## 2018-10-18 ENCOUNTER — Other Ambulatory Visit: Payer: Self-pay | Admitting: Hematology and Oncology

## 2018-10-18 NOTE — Telephone Encounter (Signed)
Patient called to request OV and Phlebotomy. Patient had labs drawn at PCP on Jan 7th- Ferritin was over 700-Labs are visible in Epic. He was last seen in April 2017.

## 2018-10-18 NOTE — Telephone Encounter (Signed)
I placed scheduling msg

## 2018-10-18 NOTE — Telephone Encounter (Signed)
Called patient per 1/13 voicemail log about scheduling appointment.  Scheduled patient appointment ok per MD nurse Clarise Cruz).  Patient aware of scheduled appointments.

## 2018-10-19 DIAGNOSIS — S83242D Other tear of medial meniscus, current injury, left knee, subsequent encounter: Secondary | ICD-10-CM | POA: Diagnosis not present

## 2018-10-21 ENCOUNTER — Telehealth: Payer: Self-pay | Admitting: Hematology and Oncology

## 2018-10-21 ENCOUNTER — Other Ambulatory Visit: Payer: Self-pay | Admitting: Hematology and Oncology

## 2018-10-21 ENCOUNTER — Inpatient Hospital Stay: Payer: Medicare Other

## 2018-10-21 ENCOUNTER — Encounter: Payer: Self-pay | Admitting: Hematology and Oncology

## 2018-10-21 ENCOUNTER — Inpatient Hospital Stay: Payer: Medicare Other | Attending: Hematology and Oncology | Admitting: Hematology and Oncology

## 2018-10-21 DIAGNOSIS — Z79899 Other long term (current) drug therapy: Secondary | ICD-10-CM

## 2018-10-21 DIAGNOSIS — E669 Obesity, unspecified: Secondary | ICD-10-CM | POA: Insufficient documentation

## 2018-10-21 NOTE — Progress Notes (Signed)
Jesse Mcclure. presents today for phlebotomy per MD orders. Phlebotomy procedure started at 1300 with 18GA PIV inserted to RAC and ended at 1315. IV needle removed intact. 500 grams removed. Patient tolerated procedure well. Food and drinks offered to patient. Patient observed for 30 minutes after procedure without any incident. AVS and schedule provided.

## 2018-10-21 NOTE — Telephone Encounter (Signed)
Printed calendar and avs. °

## 2018-10-21 NOTE — Patient Instructions (Signed)

## 2018-10-21 NOTE — Progress Notes (Signed)
[facility] OFFICE PROGRESS NOTE  Denita Lung, MD  ASSESSMENT & PLAN:  Hemochromatosis, hereditary His recent level of ferritin was high Per recent guidelines, he does not need phlebotomy unless is greater than 500. I would establish a threshold of greater than 250 to receive phlebotomy. His recent ferritin level was over 700 I recommend 1 unit of blood removed every month along with CBC and ferritin monitoring He will proceed with phlebotomy as long as his hemoglobin is above 10 and he does not need to wait for ferritin level before phlebotomy We will call him with test results Once we can get the ferritin level less than 250, we will start phlebotomy.  He will get ferritin checked again next year with his annual physical exam with his primary care doctor He will call me again to resume phlebotomy in the future if ferritin level trends up, greater than 500  Obesity (BMI 30-39.9) We had extensive discussion about weight loss strategy for healthy living and he appears to be motivated to lose approximately 30 pounds.   No orders of the defined types were placed in this encounter.   INTERVAL HISTORY: Jesse Mcclure. 68 y.o. male returns for phlebotomy treatment for hemochromatosis. He was followed by his primary care doctor on a regular basis.  His most recent annual physical examination included an iron panel which showed his ferritin was over 700 He feels well.  No new joint pain.  SUMMARY OF HEMATOLOGIC HISTORY:  I reviewed the patient's records extensive and collaborated the history with the patient. 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 He is a homozygote. Initial diagnosis dates to 11/28/2008. Ferritin at that time was 459. He is on an 3 month phlebotomy program which was subsequently discontinued.  I have reviewed the past medical history, past surgical history, social history and  family history with the patient and they are unchanged from previous note.  ALLERGIES:  has No Known Allergies.  MEDICATIONS:  Current Outpatient Medications  Medication Sig Dispense Refill  . acetaminophen (TYLENOL) 500 MG tablet Take 500 mg by mouth every 6 (six) hours as needed.    . Celecoxib (CELEBREX PO) Take 1 tablet by mouth daily as needed.    Marland Kitchen lisinopril-hydrochlorothiazide (PRINZIDE,ZESTORETIC) 20-12.5 MG tablet TAKE 1 TABLET BY MOUTH ONCE DAILY 90 tablet 3   No current facility-administered medications for this visit.      REVIEW OF SYSTEMS:   Constitutional: Denies fevers, chills or night sweats Eyes: Denies blurriness of vision Ears, nose, mouth, throat, and face: Denies mucositis or sore throat Respiratory: Denies cough, dyspnea or wheezes Cardiovascular: Denies palpitation, chest discomfort or lower extremity swelling Gastrointestinal:  Denies nausea, heartburn or change in bowel habits Skin: Denies abnormal skin rashes Lymphatics: Denies new lymphadenopathy or easy bruising Neurological:Denies numbness, tingling or new weaknesses Behavioral/Psych: Mood is stable, no new changes  All other systems were reviewed with the patient and are negative.  PHYSICAL EXAMINATION: ECOG PERFORMANCE STATUS: 0 - Asymptomatic  Vitals:   10/21/18 1222  BP: (!) 142/81  Pulse: 99  Resp: 18  Temp: 97.7 F (36.5 C)  SpO2: 97%   Filed Weights   10/21/18 1222  Weight: 233 lb 9.6 oz (106 kg)    GENERAL:alert, no distress and comfortable SKIN: skin color, texture, turgor are normal, no rashes or significant lesions EYES: normal, Conjunctiva are pink and non-injected, sclera clear OROPHARYNX:no exudate, no erythema and lips, buccal  mucosa, and tongue normal  NECK: supple, thyroid normal size, non-tender, without nodularity LYMPH:  no palpable lymphadenopathy in the cervical, axillary or inguinal LUNGS: clear to auscultation and percussion with normal breathing effort HEART:  regular rate & rhythm and no murmurs and no lower extremity edema ABDOMEN:abdomen soft, non-tender and normal bowel sounds Musculoskeletal:no cyanosis of digits and no clubbing  NEURO: alert & oriented x 3 with fluent speech, no focal motor/sensory deficits  LABORATORY DATA:  I have reviewed the data as listed     Component Value Date/Time   NA 139 10/11/2018 1115   NA 139 04/05/2014 0849   K 4.7 10/11/2018 1115   K 4.4 04/05/2014 0849   CL 100 10/11/2018 1115   CO2 23 10/11/2018 1115   CO2 23 04/05/2014 0849   GLUCOSE 102 (H) 10/11/2018 1115   GLUCOSE 105 (H) 07/29/2017 1004   GLUCOSE 109 04/05/2014 0849   BUN 23 10/11/2018 1115   BUN 16.8 04/05/2014 0849   CREATININE 0.93 10/11/2018 1115   CREATININE 0.91 07/29/2017 1004   CREATININE 0.9 04/05/2014 0849   CALCIUM 9.4 10/11/2018 1115   CALCIUM 8.7 04/05/2014 0849   PROT 7.2 10/11/2018 1115   PROT 6.7 04/05/2014 0849   ALBUMIN 4.7 10/11/2018 1115   ALBUMIN 3.8 04/05/2014 0849   AST 30 10/11/2018 1115   AST 26 04/05/2014 0849   ALT 43 10/11/2018 1115   ALT 30 04/05/2014 0849   ALKPHOS 73 10/11/2018 1115   ALKPHOS 75 04/05/2014 0849   BILITOT 1.2 10/11/2018 1115   BILITOT 0.71 04/05/2014 0849   GFRNONAA 85 10/11/2018 1115   GFRAA 98 10/11/2018 1115    No results found for: SPEP, UPEP  Lab Results  Component Value Date   WBC 6.0 10/11/2018   NEUTROABS 3.2 10/11/2018   HGB 16.4 10/11/2018   HCT 47.1 10/11/2018   MCV 97 10/11/2018   PLT 302 10/11/2018      Chemistry      Component Value Date/Time   NA 139 10/11/2018 1115   NA 139 04/05/2014 0849   K 4.7 10/11/2018 1115   K 4.4 04/05/2014 0849   CL 100 10/11/2018 1115   CO2 23 10/11/2018 1115   CO2 23 04/05/2014 0849   BUN 23 10/11/2018 1115   BUN 16.8 04/05/2014 0849   CREATININE 0.93 10/11/2018 1115   CREATININE 0.91 07/29/2017 1004   CREATININE 0.9 04/05/2014 0849   GLU 10 04/10/2014      Component Value Date/Time   CALCIUM 9.4 10/11/2018 1115    CALCIUM 8.7 04/05/2014 0849   ALKPHOS 73 10/11/2018 1115   ALKPHOS 75 04/05/2014 0849   AST 30 10/11/2018 1115   AST 26 04/05/2014 0849   ALT 43 10/11/2018 1115   ALT 30 04/05/2014 0849   BILITOT 1.2 10/11/2018 1115   BILITOT 0.71 04/05/2014 0849      I spent 10 minutes counseling the patient face to face. The total time spent in the appointment was 15 minutes and more than 50% was on counseling.   All questions were answered. The patient knows to call the clinic with any problems, questions or concerns. No barriers to learning was detected.    Heath Lark, MD 1/17/20205:27 PM

## 2018-10-21 NOTE — Assessment & Plan Note (Signed)
His recent level of ferritin was high Per recent guidelines, he does not need phlebotomy unless is greater than 500. I would establish a threshold of greater than 250 to receive phlebotomy. His recent ferritin level was over 700 I recommend 1 unit of blood removed every month along with CBC and ferritin monitoring He will proceed with phlebotomy as long as his hemoglobin is above 10 and he does not need to wait for ferritin level before phlebotomy We will call him with test results Once we can get the ferritin level less than 250, we will start phlebotomy.  He will get ferritin checked again next year with his annual physical exam with his primary care doctor He will call me again to resume phlebotomy in the future if ferritin level trends up, greater than 500

## 2018-10-21 NOTE — Assessment & Plan Note (Signed)
We had extensive discussion about weight loss strategy for healthy living and he appears to be motivated to lose approximately 30 pounds.

## 2018-11-07 ENCOUNTER — Other Ambulatory Visit: Payer: Self-pay | Admitting: Family Medicine

## 2018-11-07 MED ORDER — IBUPROFEN 800 MG PO TABS: 800.0000 mg | ORAL_TABLET | Freq: Three times a day (TID) | ORAL | 0 refills | Status: DC | PRN

## 2018-11-15 ENCOUNTER — Other Ambulatory Visit: Payer: Self-pay | Admitting: Family Medicine

## 2018-11-15 NOTE — Telephone Encounter (Signed)
[facility] is requesting to fill pt ibuprofen. Please advise [facility] 

## 2018-11-18 ENCOUNTER — Other Ambulatory Visit: Payer: Self-pay | Admitting: Hematology and Oncology

## 2018-11-21 ENCOUNTER — Inpatient Hospital Stay: Payer: Medicare Other | Attending: Hematology and Oncology

## 2018-11-21 ENCOUNTER — Inpatient Hospital Stay: Payer: Medicare Other

## 2018-11-21 LAB — CBC WITH DIFFERENTIAL/PLATELET
Abs Immature Granulocytes: 0.01 10*3/uL (ref 0.00–0.07)
Basophils Absolute: 0 10*3/uL (ref 0.0–0.1)
Basophils Relative: 1 %
Eosinophils Absolute: 0.1 10*3/uL (ref 0.0–0.5)
Eosinophils Relative: 3 %
HCT: 43.8 % (ref 39.0–52.0)
Hemoglobin: 14.6 g/dL (ref 13.0–17.0)
Immature Granulocytes: 0 %
Lymphocytes Relative: 34 %
Lymphs Abs: 1.4 10*3/uL (ref 0.7–4.0)
MCH: 33.8 pg (ref 26.0–34.0)
MCHC: 33.3 g/dL (ref 30.0–36.0)
MCV: 101.4 fL — ABNORMAL HIGH (ref 80.0–100.0)
Monocytes Absolute: 0.6 10*3/uL (ref 0.1–1.0)
Monocytes Relative: 14 %
Neutro Abs: 2 10*3/uL (ref 1.7–7.7)
Neutrophils Relative %: 48 %
Platelets: 208 10*3/uL (ref 150–400)
RBC: 4.32 MIL/uL (ref 4.22–5.81)
RDW: 12 % (ref 11.5–15.5)
WBC: 4.1 10*3/uL (ref 4.0–10.5)
nRBC: 0 % (ref 0.0–0.2)

## 2018-11-21 NOTE — Progress Notes (Signed)
BP elevated pre and post phlebotomy.  See flowsheet for values.  Dr. Alvy Bimler notified.  Pt to follow up with PCP.    Phlebotomy preformed via right AC over 4 minutes using 16g phlebotomy set.  500gm removed.  Pt tolerated well.  Observed 30 minutes post procedure.  Drink and snack provided.  Other vital signs stable

## 2018-11-21 NOTE — Patient Instructions (Signed)

## 2018-11-22 ENCOUNTER — Other Ambulatory Visit: Payer: Self-pay | Admitting: Family Medicine

## 2018-11-22 LAB — FERRITIN: Ferritin: 132 ng/mL (ref 24–336)

## 2018-11-22 NOTE — Telephone Encounter (Signed)
[facility] is requesting to fill pt ibuprofen. Please advise [facility] 

## 2018-11-23 ENCOUNTER — Telehealth: Payer: Self-pay

## 2018-11-23 NOTE — Telephone Encounter (Signed)
Called and given below message. He verbalized understanding. Appt's canceled.

## 2018-11-23 NOTE — Telephone Encounter (Signed)
-----   Message from Heath Lark, MD sent at 11/22/2018  4:18 PM EST ----- Regarding: non-urgent Pls call him. Ferritin level came back now normal, from 764 to 132 I suggest cancelling all his outstanding appt here Recommend recheck labs with PCP in 6 months and call us again if ferritin is over 250

## 2018-11-24 ENCOUNTER — Ambulatory Visit (INDEPENDENT_AMBULATORY_CARE_PROVIDER_SITE_OTHER): Payer: Medicare Other | Admitting: Family Medicine

## 2018-11-24 ENCOUNTER — Encounter: Payer: Self-pay | Admitting: Family Medicine

## 2018-11-24 ENCOUNTER — Ambulatory Visit
Admission: RE | Admit: 2018-11-24 | Discharge: 2018-11-24 | Disposition: A | Discharge status: Home / Self Care | Payer: Medicare Other | Source: Ambulatory Visit | Attending: Family Medicine | Admitting: Family Medicine

## 2018-11-24 ENCOUNTER — Other Ambulatory Visit: Payer: Self-pay | Admitting: Family Medicine

## 2018-11-24 DIAGNOSIS — G8929 Other chronic pain: Secondary | ICD-10-CM | POA: Diagnosis not present

## 2018-11-24 DIAGNOSIS — M545 Low back pain, unspecified: Secondary | ICD-10-CM

## 2018-11-24 DIAGNOSIS — M5137 Other intervertebral disc degeneration, lumbosacral region: Secondary | ICD-10-CM | POA: Diagnosis not present

## 2018-11-24 DIAGNOSIS — M5136 Other intervertebral disc degeneration, lumbar region: Secondary | ICD-10-CM | POA: Diagnosis not present

## 2018-11-24 MED ORDER — DICLOFENAC SODIUM 75 MG PO TBEC: 75.0000 mg | DELAYED_RELEASE_TABLET | Freq: Two times a day (BID) | ORAL | 0 refills | Status: DC

## 2018-11-24 NOTE — Telephone Encounter (Signed)
This was written today, I think he has this set up with the pharmacy for his rx's to automatically ask for 90 days with each one submitted.

## 2018-11-24 NOTE — Progress Notes (Signed)
   Subjective:    Patient ID: Jesse Guest., male    DOB: 06/16/1951, 68 y.o.   MRN: [id number]  HPI He is here for evaluation and treatment of low back pain.  He has a long history of difficulty with this and at least on 2 occasions has been given prednisone to help with that.  He was diagnosed with sciatica by Dr. Lynann Bologna.  Recently he has been in the process of moving and doing a lot of lifting.  This is been going on for 2 weeks.  He complains of left gluteal pain but in spite of adequate dosing of ibuprofen has been actually getting worse.  When he sits on that area he has discomfort as well as difficulty with sleeping.   Review of Systems     Objective:   Physical Exam Normal lumbar curve and motion.  Some tenderness to palpation over the sciatic notch and ischio tuberosity.  Negative straight leg raising.  Normal motor, sensory and DTRs.       Assessment & Plan:  Hemochromatosis, hereditary ([place])  Chronic left-sided low back pain without sciatica - Plan: diclofenac (VOLTAREN) 75 MG EC tablet, DG Lumbar Spine Complete Will switch to a different pain medicine, check out x-rays and if continued difficulty possibly refer for ultrasound evaluation to further evaluate the pain/numbness sensation in the gluteal area.

## 2018-12-05 ENCOUNTER — Ambulatory Visit (INDEPENDENT_AMBULATORY_CARE_PROVIDER_SITE_OTHER): Payer: Medicare Other | Admitting: Family Medicine

## 2018-12-05 ENCOUNTER — Encounter: Payer: Self-pay | Admitting: Family Medicine

## 2018-12-05 VITALS — BP 112/78 | HR 88 | Temp 97.9°F | Wt 226.6 lb

## 2018-12-05 DIAGNOSIS — R300 Dysuria: Secondary | ICD-10-CM | POA: Diagnosis not present

## 2018-12-05 MED ORDER — SULFAMETHOXAZOLE-TRIMETHOPRIM 800-160 MG PO TABS: 1.0000 | ORAL_TABLET | Freq: Two times a day (BID) | ORAL | 0 refills | Status: DC

## 2018-12-05 NOTE — Progress Notes (Signed)
   Subjective:    Patient ID: Jesse Guest., male    DOB: May 02, 1951, 68 y.o.   MRN: [id number]  HPI He is here for a recheck.  He states that the sacral/gluteal pain is doing a little bit better.  No numbness, tingling or weakness in his legs.  He does complain of left scrotal decreased sensation as well as some dysuria but no discharge.  No bowel or bladder trouble.   Review of Systems     Objective:   Physical Exam Alert and in no distress.  Good motion of his back.  Normal sensation of the scrotum and perineal area.  Urine microscopic was negative.  Rectal exam did show a boggy prostate but it was nontender.       Assessment & Plan:  Dysuria - Plan: Urine Culture, sulfamethoxazole-trimethoprim (BACTRIM DS,SEPTRA DS) 800-160 MG tablet I will do a culture on this and hopefully this will also take care of the sacral discomfort.  I found no symptoms of radicular concern.

## 2018-12-06 LAB — URINE CULTURE: Organism ID, Bacteria: NO GROWTH

## 2018-12-07 ENCOUNTER — Other Ambulatory Visit: Payer: Self-pay | Admitting: Family Medicine

## 2018-12-07 DIAGNOSIS — G8929 Other chronic pain: Secondary | ICD-10-CM

## 2018-12-07 DIAGNOSIS — M545 Low back pain, unspecified: Secondary | ICD-10-CM

## 2018-12-07 NOTE — Telephone Encounter (Signed)
Is this okay to refill? 

## 2018-12-14 DIAGNOSIS — M5136 Other intervertebral disc degeneration, lumbar region: Secondary | ICD-10-CM | POA: Diagnosis not present

## 2018-12-20 ENCOUNTER — Other Ambulatory Visit: Payer: Medicare Other

## 2019-01-19 ENCOUNTER — Encounter: Payer: Self-pay | Admitting: Hematology and Oncology

## 2019-01-20 ENCOUNTER — Other Ambulatory Visit: Payer: Medicare Other

## 2019-02-08 ENCOUNTER — Other Ambulatory Visit: Payer: Medicare Other

## 2019-02-08 ENCOUNTER — Other Ambulatory Visit: Payer: Self-pay

## 2019-02-08 DIAGNOSIS — R972 Elevated prostate specific antigen [PSA]: Secondary | ICD-10-CM

## 2019-02-08 NOTE — Addendum Note (Signed)
Addended by: Elyse Jarvis on: 02/08/2019 09:17 AM   Modules accepted: Orders

## 2019-02-09 ENCOUNTER — Other Ambulatory Visit: Payer: Self-pay | Admitting: Hematology and Oncology

## 2019-02-09 ENCOUNTER — Telehealth: Payer: Self-pay | Admitting: *Deleted

## 2019-02-09 LAB — PSA, TOTAL AND FREE
PSA, Free Pct: 17 %
PSA, Free: 0.92 ng/mL
Prostate Specific Ag, Serum: 5.4 ng/mL — ABNORMAL HIGH (ref 0.0–4.0)

## 2019-02-09 LAB — FERRITIN: Ferritin: 293 ng/mL (ref 30–400)

## 2019-02-09 NOTE — Telephone Encounter (Signed)
-----   Message from Heath Lark, MD sent at 02/09/2019 12:03 PM EDT ----- Regarding: phlebotomy I received a copy of blood test from PCP. Ferritin is over 250. I recommend removal of 1 unit of blood, non urgent If he is willing to come in, schedule next week and advise recehck with PCP in 3 months If he is not willing to come in, then schedule for 1st week of June No need to see me

## 2019-02-09 NOTE — Telephone Encounter (Signed)
Telephone call to patient to advise below as directed- he would like to come in on Tuesday or Thursday at 8am or 3pm either day. Scheduling message sent.

## 2019-02-10 ENCOUNTER — Telehealth: Payer: Self-pay | Admitting: Hematology and Oncology

## 2019-02-10 NOTE — Telephone Encounter (Signed)
Called patient to schedule phlebotomy per sch msg. Spoke with patient. Confirmed date and time

## 2019-02-15 ENCOUNTER — Other Ambulatory Visit: Payer: Medicare Other

## 2019-02-16 ENCOUNTER — Other Ambulatory Visit: Payer: Self-pay

## 2019-02-16 ENCOUNTER — Inpatient Hospital Stay: Payer: Medicare Other | Attending: Hematology and Oncology

## 2019-03-16 ENCOUNTER — Encounter: Payer: Self-pay | Admitting: Family Medicine

## 2019-03-16 ENCOUNTER — Other Ambulatory Visit: Payer: Self-pay

## 2019-03-16 ENCOUNTER — Ambulatory Visit (INDEPENDENT_AMBULATORY_CARE_PROVIDER_SITE_OTHER): Payer: Medicare Other | Admitting: Family Medicine

## 2019-03-16 VITALS — BP 132/80 | HR 64 | Temp 98.4°F | Wt 224.4 lb

## 2019-03-16 DIAGNOSIS — M545 Low back pain: Secondary | ICD-10-CM

## 2019-03-16 DIAGNOSIS — G8929 Other chronic pain: Secondary | ICD-10-CM

## 2019-03-16 NOTE — Progress Notes (Signed)
   Subjective:    Patient ID: Jesse Guest., male    DOB: 28-Aug-1951, 68 y.o.   MRN: [id number]  HPI He is here for a consult concerning continued difficulty with low back pain.  He initially treated conservatively and x-rays were ordered.  He continued to have difficulty and was seen by Dr. Lynann Bologna and was placed on a steroid Dosepak.  This was repeated on one occasion.  He states that the pain did go away while he was on the steroids however he continues to complain of left-sided low back pain that is now interfering with his sleep.  No numbness, tingling or weakness but does have some referred pain down his leg.  He has been using nonsteroid anti-inflammatories but has not gotten relief   Review of Systems     Objective:   Physical Exam Full motion of his back with normal lumbar curve and motion.  Slight tenderness over the left SI joint area but provocative testing was negative.  Straight leg raising negative.  Normal motor and sensory.      Assessment & Plan:  Chronic left-sided low back pain without sciatica - Plan: MR Lumbar Spine Wo Contrast, since he has had pain for over 3 months with no good relief of his symptoms, an MRI is appropriate.

## 2019-03-17 NOTE — Addendum Note (Signed)
Addended by: Denita Lung on: 03/17/2019 03:37 PM   Modules accepted: Orders

## 2019-03-20 ENCOUNTER — Ambulatory Visit
Admission: RE | Admit: 2019-03-20 | Discharge: 2019-03-20 | Disposition: A | Discharge status: Home / Self Care | Payer: Medicare Other | Source: Ambulatory Visit | Attending: Family Medicine | Admitting: Family Medicine

## 2019-03-20 ENCOUNTER — Other Ambulatory Visit: Payer: Medicare Other

## 2019-03-20 DIAGNOSIS — C720 Malignant neoplasm of spinal cord: Secondary | ICD-10-CM | POA: Diagnosis not present

## 2019-03-21 ENCOUNTER — Telehealth: Payer: Self-pay | Admitting: Family Medicine

## 2019-03-21 NOTE — Telephone Encounter (Signed)
Opal Sidles with [facility] called to give stat report on this patient.  Results are in system.

## 2019-03-22 ENCOUNTER — Other Ambulatory Visit: Payer: Self-pay | Admitting: Neurosurgery

## 2019-03-22 ENCOUNTER — Other Ambulatory Visit (HOSPITAL_COMMUNITY): Payer: Self-pay | Admitting: Neurosurgery

## 2019-03-22 DIAGNOSIS — C414 Malignant neoplasm of pelvic bones, sacrum and coccyx: Secondary | ICD-10-CM | POA: Diagnosis not present

## 2019-03-22 DIAGNOSIS — M533 Sacrococcygeal disorders, not elsewhere classified: Secondary | ICD-10-CM

## 2019-03-23 ENCOUNTER — Other Ambulatory Visit: Payer: Self-pay | Admitting: Family Medicine

## 2019-03-23 ENCOUNTER — Other Ambulatory Visit: Payer: Self-pay | Admitting: Radiology

## 2019-03-23 MED ORDER — HYDROCODONE-ACETAMINOPHEN 5-325 MG PO TABS: 1.0000 | ORAL_TABLET | Freq: Four times a day (QID) | ORAL | 0 refills | Status: DC | PRN

## 2019-03-27 ENCOUNTER — Encounter (HOSPITAL_COMMUNITY): Payer: Self-pay

## 2019-03-27 ENCOUNTER — Ambulatory Visit (HOSPITAL_COMMUNITY)
Admission: RE | Admit: 2019-03-27 | Discharge: 2019-03-27 | Disposition: A | Discharge status: Home / Self Care | Payer: Medicare Other | Source: Ambulatory Visit | Attending: Neurology | Admitting: Neurology

## 2019-03-27 ENCOUNTER — Other Ambulatory Visit: Payer: Self-pay

## 2019-03-27 ENCOUNTER — Ambulatory Visit (HOSPITAL_COMMUNITY)
Admission: RE | Admit: 2019-03-27 | Discharge: 2019-03-27 | Disposition: A | Discharge status: Home / Self Care | Payer: Medicare Other | Source: Ambulatory Visit | Attending: Neurosurgery | Admitting: Neurosurgery

## 2019-03-27 DIAGNOSIS — D481 Neoplasm of uncertain behavior of connective and other soft tissue: Secondary | ICD-10-CM | POA: Diagnosis not present

## 2019-03-27 DIAGNOSIS — I10 Essential (primary) hypertension: Secondary | ICD-10-CM | POA: Diagnosis not present

## 2019-03-27 DIAGNOSIS — Z79899 Other long term (current) drug therapy: Secondary | ICD-10-CM | POA: Insufficient documentation

## 2019-03-27 DIAGNOSIS — R222 Localized swelling, mass and lump, trunk: Secondary | ICD-10-CM | POA: Diagnosis not present

## 2019-03-27 DIAGNOSIS — M533 Sacrococcygeal disorders, not elsewhere classified: Secondary | ICD-10-CM

## 2019-03-27 DIAGNOSIS — E785 Hyperlipidemia, unspecified: Secondary | ICD-10-CM | POA: Insufficient documentation

## 2019-03-27 DIAGNOSIS — Z8612 Personal history of poliomyelitis: Secondary | ICD-10-CM | POA: Insufficient documentation

## 2019-03-27 DIAGNOSIS — Z803 Family history of malignant neoplasm of breast: Secondary | ICD-10-CM | POA: Diagnosis not present

## 2019-03-27 DIAGNOSIS — M7989 Other specified soft tissue disorders: Secondary | ICD-10-CM | POA: Diagnosis not present

## 2019-03-27 DIAGNOSIS — M899 Disorder of bone, unspecified: Secondary | ICD-10-CM | POA: Diagnosis not present

## 2019-03-27 DIAGNOSIS — C414 Malignant neoplasm of pelvic bones, sacrum and coccyx: Secondary | ICD-10-CM

## 2019-03-27 LAB — CBC WITH DIFFERENTIAL/PLATELET
Abs Immature Granulocytes: 0.02 10*3/uL (ref 0.00–0.07)
Basophils Absolute: 0 10*3/uL (ref 0.0–0.1)
Basophils Relative: 1 %
Eosinophils Absolute: 0.1 10*3/uL (ref 0.0–0.5)
Eosinophils Relative: 2 %
HCT: 44.4 % (ref 39.0–52.0)
Hemoglobin: 14.8 g/dL (ref 13.0–17.0)
Immature Granulocytes: 0 %
Lymphocytes Relative: 28 %
Lymphs Abs: 1.7 10*3/uL (ref 0.7–4.0)
MCH: 33.7 pg (ref 26.0–34.0)
MCHC: 33.3 g/dL (ref 30.0–36.0)
MCV: 101.1 fL — ABNORMAL HIGH (ref 80.0–100.0)
Monocytes Absolute: 0.9 10*3/uL (ref 0.1–1.0)
Monocytes Relative: 16 %
Neutro Abs: 3.3 10*3/uL (ref 1.7–7.7)
Neutrophils Relative %: 53 %
Platelets: 267 10*3/uL (ref 150–400)
RBC: 4.39 MIL/uL (ref 4.22–5.81)
RDW: 12.7 % (ref 11.5–15.5)
WBC: 6 10*3/uL (ref 4.0–10.5)
nRBC: 0 % (ref 0.0–0.2)

## 2019-03-27 LAB — BASIC METABOLIC PANEL
Anion gap: 11 (ref 5–15)
BUN: 19 mg/dL (ref 8–23)
CO2: 24 mmol/L (ref 22–32)
Calcium: 9.5 mg/dL (ref 8.9–10.3)
Chloride: 99 mmol/L (ref 98–111)
Creatinine, Ser: 0.75 mg/dL (ref 0.61–1.24)
GFR calc Af Amer: >60 mL/min (ref >60)
GFR calc non Af Amer: >60 mL/min (ref >60)
Glucose, Bld: 110 mg/dL — ABNORMAL HIGH (ref 70–99)
Potassium: 3.9 mmol/L (ref 3.5–5.1)
Sodium: 134 mmol/L — ABNORMAL LOW (ref 135–145)

## 2019-03-27 LAB — PROTIME-INR
INR: 1 (ref 0.8–1.2)
Prothrombin Time: 12.8 seconds (ref 11.4–15.2)

## 2019-03-27 MED ORDER — MIDAZOLAM HCL 2 MG/2ML IJ SOLN
INTRAMUSCULAR | Status: AC | PRN
  Administered 2019-03-27 (×3): 1 mg via INTRAVENOUS

## 2019-03-27 MED ORDER — LIDOCAINE HCL (PF) 1 % IJ SOLN
INTRAMUSCULAR | Status: AC | PRN
  Administered 2019-03-27: 10 mL

## 2019-03-27 MED ORDER — MIDAZOLAM HCL 2 MG/2ML IJ SOLN
INTRAMUSCULAR | Status: AC
  Filled 2019-03-27: qty 4

## 2019-03-27 MED ORDER — NALOXONE HCL 0.4 MG/ML IJ SOLN
INTRAMUSCULAR | Status: AC
  Filled 2019-03-27: qty 1

## 2019-03-27 MED ORDER — FENTANYL CITRATE (PF) 100 MCG/2ML IJ SOLN
INTRAMUSCULAR | Status: AC
  Filled 2019-03-27: qty 4

## 2019-03-27 MED ORDER — SODIUM CHLORIDE 0.9 % IV SOLN
INTRAVENOUS | Status: DC
  Administered 2019-03-27: 08:00:00 via INTRAVENOUS

## 2019-03-27 MED ORDER — FENTANYL CITRATE (PF) 100 MCG/2ML IJ SOLN
INTRAMUSCULAR | Status: AC | PRN
  Administered 2019-03-27 (×2): 50 ug via INTRAVENOUS

## 2019-03-27 MED ORDER — FLUMAZENIL 0.5 MG/5ML IV SOLN
INTRAVENOUS | Status: AC
  Filled 2019-03-27: qty 5

## 2019-03-27 NOTE — Procedures (Signed)
Interventional Radiology Procedure Note  Procedure: CT biopsy sacral mass.  Complications: None  Estimated Blood Loss: None  Recommendations: - Bedrest x 1 hr - DC home  Signed,  Criselda Peaches, MD

## 2019-03-27 NOTE — H&P (Signed)
Chief Complaint: Patient was seen in consultation today for No chief complaint on file.  at the request of [facility]  Referring Physician(s): Pool,Henry  Supervising Physician: Jacqulynn Cadet  Patient Status: [facility] - Out-pt  History of Present Illness: Jesse Mcclure. is a 67 y.o. male referred for biopsy of left sided lumbrosacral mass. PMHx, meds, labs, imaging, allergies reviewed. Feels well, no recent fevers, chills, illness. Has been NPO today as directed.   Past Medical History:  Diagnosis Date   BPH (benign prostatic hypertrophy)    Degenerative disc disease    Diverticulosis    Hemochromatosis    HTN (hypertension)    Hyperlipidemia    Polio    Status post Nissen fundoplication (without gastrostomy tube) procedure    for hiatal hernia.    Past Surgical History:  Procedure Laterality Date   FOOT SURGERY     HIATAL HERNIA REPAIR     OTHER SURGICAL HISTORY     Cataract Surgery--Both eyes   Undescended testes Right 1968    Allergies: Patient has no known allergies.  Medications: Prior to Admission medications   Medication Sig Start Date End Date Taking? Authorizing Provider  acetaminophen (TYLENOL) 500 MG tablet Take 500 mg by mouth every 6 (six) hours as needed.   Yes [provider]  Celecoxib (CELEBREX PO) Take 1 tablet by mouth daily as needed.   Yes [provider]  diclofenac sodium (VOLTAREN) 1 % GEL Apply 2 g topically 4 (four) times daily as needed.   Yes [provider]  HYDROcodone-acetaminophen (NORCO) 5-325 MG tablet Take 1 tablet by mouth every 6 (six) hours as needed for moderate pain. 03/23/19  Yes Denita Lung, MD  lisinopril-hydrochlorothiazide (PRINZIDE,ZESTORETIC) 20-12.5 MG tablet TAKE 1 TABLET BY MOUTH ONCE DAILY 09/22/18  Yes Denita Lung, MD  diclofenac (VOLTAREN) 75 MG EC tablet TAKE 1 TABLET(75 MG) BY MOUTH TWICE DAILY 12/07/18   Denita Lung, MD  ibuprofen (ADVIL,MOTRIN) 800 MG  tablet TAKE 1 TABLET(800 MG) BY MOUTH EVERY 8 HOURS AS NEEDED 11/22/18   Denita Lung, MD  sulfamethoxazole-trimethoprim (BACTRIM DS,SEPTRA DS) 800-160 MG tablet Take 1 tablet by mouth 2 (two) times daily. Patient not taking: Reported on 03/16/2019 12/05/18   Denita Lung, MD     Family History  Problem Relation Age of Onset   Breast cancer Mother    Breast cancer Sister     Social History   Socioeconomic History   Marital status: Single    Spouse name: Not on file   Number of children: Not on file   Years of education: Not on file   Highest education level: Not on file  Occupational History   Not on file  Social Needs   Financial resource strain: Not on file   Food insecurity    Worry: Not on file    Inability: Not on file   Transportation needs    Medical: Not on file    Non-medical: Not on file  Tobacco Use   Smoking status: Never Smoker   Smokeless tobacco: Never Used  Substance and Sexual Activity   Alcohol use: Yes    Alcohol/week: 4.0 standard drinks    Types: 4 drink(s) per week    Comment: 3-4 drinks per week.    Drug use: No   Sexual activity: Yes  Lifestyle   Physical activity    Days per week: Not on file    Minutes per session: Not on file  Stress: Not on file  Relationships   Social connections    Talks on phone: Not on file    Gets together: Not on file    Attends religious service: Not on file    Active member of club or organization: Not on file    Attends meetings of clubs or organizations: Not on file    Relationship status: Not on file  Other Topics Concern   Not on file  Social History Narrative   Not on file     Review of Systems: A 12 point ROS discussed and pertinent positives are indicated in the HPI above.  All other systems are negative.  Review of Systems  Vital Signs: Ht 5\' 9"  (1.753 m)    Wt 101.8 kg    BMI 33.14 kg/m   Physical Exam Constitutional:      Appearance: Normal appearance.  HENT:      Mouth/Throat:     Mouth: Mucous membranes are moist.     Pharynx: Oropharynx is clear.  Cardiovascular:     Rate and Rhythm: Normal rate and regular rhythm.     Heart sounds: Normal heart sounds.  Pulmonary:     Effort: Pulmonary effort is normal. No respiratory distress.     Breath sounds: Normal breath sounds.  Neurological:     General: No focal deficit present.     Mental Status: He is alert and oriented to person, place, and time.  Psychiatric:        Mood and Affect: Mood normal.        Judgment: Judgment normal.     Imaging: Mr Lumbar Spine Wo Contrast  Result Date: 03/20/2019 CLINICAL DATA:  Low back pain and left buttock and leg pain since March 2020. EXAM: MRI LUMBAR SPINE WITHOUT CONTRAST TECHNIQUE: Multiplanar, multisequence MR imaging of the lumbar spine was performed. No intravenous contrast was administered. COMPARISON:  Lumbar radiographs dated 11/24/2018 FINDINGS: Segmentation:  Standard. Alignment:  Minimal retrolisthesis of L2 on L3 and of L3 on L4. Vertebrae: There is an expansile mass involving the left side of the L5 vertebral body destroying the pedicle and inferior and superior facets. This mass measures 6 x 5 x 3.3 cm. The mass extends into the left psoas muscle. There is a 5 x 5.4 x 4.0 cm expansile mass involving the S1 and S2 segments of the sacrum to the left of midline. This mass extends into the sacral spinal canal and deviates the thecal sac to the right at S1-2. Conus medullaris and cauda equina: Conus extends to the L1-2 level. Conus and cauda equina appear normal. Paraspinal and other soft tissues: The masses at L5 and S1 due extending into the adjacent soft tissues. The bladder is distended with an irregular bladder wall and small bladder diverticula. This suggests chronic bladder outlet obstruction. Disc levels: L1-2: Tiny disc bulge to the right of midline with no neural impingement. Disc desiccation. L2-3: Disc desiccation with slight retrolisthesis. Small  broad-based disc bulge asymmetric into the left neural foramen and lateral to it with a slight mass effect upon the left L2 nerve lateral to the neural foramen best seen on image 17 of series 10. L3-4: Small broad-based soft disc protrusion with slight compression of the thecal sac without focal neural impingement. L4-5: Disc space narrowing with disc desiccation. No disc bulging or protrusion. Tumor in the left side of the L5 vertebral body does extend into the left paraspinal soft tissues and surrounds the left L4 nerve best seen on  image 31 of series 10. L5-S1: The tumor in the left side of L5 extends into the left lateral recess and left neural foramen compressing the left L5 nerve in the neural foramen. S1-2: The tumor in the left side of S1 on S2 extends into the sacral spinal canal and deviates the thecal sac to the right which has a mass effect upon the left S1 nerve root sleeve and should affect the more distal left sacral nerves. IMPRESSION: 1. Tumor involving the L5 and S1 and S2 segments of the spine as described with mass effects upon the left L4, L5, S1 and more distal left sacral nerves as described above. 2. Does the patient have a history of malignancy? 3. 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 Electronically Signed   By: Lorriane Shire M.D.   On: 03/20/2019 17:51    Labs:  CBC: Recent Labs    10/11/18 1115 11/21/18 0803 03/27/19 0738  WBC 6.0 4.1 6.0  HGB 16.4 14.6 14.8  HCT 47.1 43.8 44.4  PLT 302 208 267    COAGS: Recent Labs    03/27/19 0738  INR 1.0    BMP: Recent Labs    10/11/18 1115 03/27/19 0738  NA 139 134*  K 4.7 3.9  CL 100 99  CO2 23 24  GLUCOSE 102* 110*  BUN 23 19  CALCIUM 9.4 9.5  CREATININE 0.93 0.75  GFRNONAA 85 >60  GFRAA 98 >60    LIVER FUNCTION TESTS: Recent Labs    10/11/18 1115  BILITOT 1.2  AST 30  ALT 43  ALKPHOS 73  PROT 7.2  ALBUMIN 4.7    TUMOR MARKERS: No results for input(s): AFPTM,  CEA, CA199, CHROMGRNA in the last 8760 hours.  Assessment and Plan: Left sided lumbrosacral mass For CT guided biopsy Labs ok Risks and benefits of biopsy was discussed with the patient and/or patient's family including, but not limited to bleeding, infection, damage to adjacent structures or low yield requiring additional tests.  All of the questions were answered and there is agreement to proceed.  Consent signed and in chart.    Thank you for this interesting consult.  I greatly enjoyed meeting Jesse Mcclure. and look forward to participating in their care.  A copy of this report was sent to the requesting provider on this date.  Electronically Signed: Ascencion Dike, PA-C 03/27/2019, 8:44 AM   I spent a total of 20 minutes in face to face in clinical consultation, greater than 50% of which was counseling/coordinating care for mass biopsy

## 2019-03-27 NOTE — Discharge Instructions (Signed)
Needle Biopsy, Care After °These instructions tell you how to care for yourself after your procedure. Your doctor may also give you more specific instructions. Call your doctor if you have any problems or questions. °What can I expect after the procedure? °After the procedure, it is common to have: °· Soreness. °· Bruising. °· Mild pain. °Follow these instructions at home: ° °· Return to your normal activities as told by your doctor. Ask your doctor what activities are safe for you. °· Take over-the-counter and prescription medicines only as told by your doctor. °· Wash your hands with soap and water before you change your bandage (dressing). If you cannot use soap and water, use hand sanitizer. °· Follow instructions from your doctor about: °? How to take care of your puncture site. °? When and how to change your bandage. °? When to remove your bandage. °· Check your puncture site every day for signs of infection. Watch for: °? Redness, swelling, or pain. °? Fluid or blood.  °? Pus or a bad smell. °? Warmth. °· Do not take baths, swim, or use a hot tub until your doctor approves. Ask your doctor if you may take showers. You may only be allowed to take sponge baths. °· Keep all follow-up visits as told by your doctor. This is important. °Contact a doctor if you have: °· A fever. °· Redness, swelling, or pain at the puncture site, and it lasts longer than a few days. °· Fluid, blood, or pus coming from the puncture site. °· Warmth coming from the puncture site. °Get help right away if: °· You have a lot of bleeding from the puncture site. °Summary °· After the procedure, it is common to have soreness, bruising, or mild pain at the puncture site. °· Check your puncture site every day for signs of infection, such as redness, swelling, or pain. °· Get help right away if you have severe bleeding from your puncture site. °This information is not intended to replace advice given to you by your health care provider. Make  sure you discuss any questions you have with your health care provider. °Document Released: 09/03/2008 Document Revised: 10/04/2017 Document Reviewed: 10/04/2017 °Elsevier Interactive Patient Education © 2019 [employer] °Moderate Conscious Sedation, Adult, Care After °These instructions provide you with information about caring for yourself after your procedure. Your health care provider may also give you more specific instructions. Your treatment has been planned according to current medical practices, but problems sometimes occur. Call your health care provider if you have any problems or questions after your procedure. °What can I expect after the procedure? °After your procedure, it is common: °· To feel sleepy for several hours. °· To feel clumsy and have poor balance for several hours. °· To have poor judgment for several hours. °· To vomit if you eat too soon. °Follow these instructions at home: °For at least 24 hours after the procedure: ° °· Do not: °? Participate in activities where you could fall or become injured. °? Drive. °? Use heavy machinery. °? Drink alcohol. °? Take sleeping pills or medicines that cause drowsiness. °? Make important decisions or sign legal documents. °? Take care of children on your own. °· Rest. °Eating and drinking °· Follow the diet recommended by your health care provider. °· If you vomit: °? Drink water, juice, or soup when you can drink without vomiting. °? Make sure you have little or no nausea before eating solid foods. °General instructions °· Have a responsible adult stay   with you until you are awake and alert. °· Take over-the-counter and prescription medicines only as told by your health care provider. °· If you smoke, do not smoke without supervision. °· Keep all follow-up visits as told by your health care provider. This is important. °Contact a health care provider if: °· You keep feeling nauseous or you keep vomiting. °· You feel light-headed. °· You develop a  rash. °· You have a fever. °Get help right away if: °· You have trouble breathing. °This information is not intended to replace advice given to you by your health care provider. Make sure you discuss any questions you have with your health care provider. °Document Released: 07/12/2013 Document Revised: 02/24/2016 Document Reviewed: 01/11/2016 °Elsevier Interactive Patient Education © 2019 [employer] ° °

## 2019-03-31 ENCOUNTER — Other Ambulatory Visit: Payer: Self-pay | Admitting: Family Medicine

## 2019-03-31 ENCOUNTER — Other Ambulatory Visit: Payer: Self-pay | Admitting: Radiation Therapy

## 2019-03-31 MED ORDER — HYDROCODONE-ACETAMINOPHEN 5-325 MG PO TABS: 1.0000 | ORAL_TABLET | Freq: Four times a day (QID) | ORAL | 0 refills | Status: DC | PRN

## 2019-04-02 ENCOUNTER — Encounter: Payer: Self-pay | Admitting: Family Medicine

## 2019-04-03 ENCOUNTER — Inpatient Hospital Stay: Payer: Medicare Other | Attending: Hematology and Oncology

## 2019-04-03 DIAGNOSIS — Z79899 Other long term (current) drug therapy: Secondary | ICD-10-CM | POA: Insufficient documentation

## 2019-04-03 DIAGNOSIS — K5909 Other constipation: Secondary | ICD-10-CM | POA: Insufficient documentation

## 2019-04-03 DIAGNOSIS — G893 Neoplasm related pain (acute) (chronic): Secondary | ICD-10-CM | POA: Insufficient documentation

## 2019-04-03 DIAGNOSIS — C9000 Multiple myeloma not having achieved remission: Secondary | ICD-10-CM | POA: Insufficient documentation

## 2019-04-04 ENCOUNTER — Telehealth: Payer: Self-pay | Admitting: Hematology and Oncology

## 2019-04-04 ENCOUNTER — Encounter: Payer: Self-pay | Admitting: Hematology and Oncology

## 2019-04-04 ENCOUNTER — Other Ambulatory Visit: Payer: Self-pay

## 2019-04-04 ENCOUNTER — Inpatient Hospital Stay (HOSPITAL_BASED_OUTPATIENT_CLINIC_OR_DEPARTMENT_OTHER): Payer: Medicare Other | Admitting: Hematology and Oncology

## 2019-04-04 VITALS — BP 154/87 | HR 101 | Temp 99.1°F | Resp 18 | Ht 69.0 in | Wt 227.0 lb

## 2019-04-04 DIAGNOSIS — C9000 Multiple myeloma not having achieved remission: Secondary | ICD-10-CM

## 2019-04-04 DIAGNOSIS — K5909 Other constipation: Secondary | ICD-10-CM

## 2019-04-04 DIAGNOSIS — E559 Vitamin D deficiency, unspecified: Secondary | ICD-10-CM

## 2019-04-04 DIAGNOSIS — G893 Neoplasm related pain (acute) (chronic): Secondary | ICD-10-CM

## 2019-04-04 DIAGNOSIS — Z79899 Other long term (current) drug therapy: Secondary | ICD-10-CM | POA: Diagnosis not present

## 2019-04-04 MED ORDER — DEXAMETHASONE 4 MG PO TABS: 4.0000 mg | ORAL_TABLET | Freq: Every day | ORAL | 0 refills | Status: DC

## 2019-04-04 NOTE — Assessment & Plan Note (Signed)
I recommend addition of low-dose dexamethasone in addition to his pain medicine that was prescribed by his primary care doctor We discussed expected side effects of dexamethasone His neurosurgeon has actually refer him to see radiation oncologist to discuss the risk and benefits of radiation therapy

## 2019-04-04 NOTE — Assessment & Plan Note (Signed)
We will recheck his ferritin level

## 2019-04-04 NOTE — Progress Notes (Signed)
[place] OFFICE PROGRESS NOTE  Patient Care Team: Denita Lung, MD as PCP - General (Family Medicine) Heath Lark, MD as Consulting Physician (Hematology and Oncology)  ASSESSMENT & PLAN:  Multiple myeloma without remission ([place]) I have reviewed the plan of care with the patient We discussed the importance of staging We discussed the differences in treatment, between plasmacytoma versus multiple myeloma I recommend bone marrow aspirate and biopsy, PET CT scan, blood work and complete 24-hour urine collection I have reviewed the pathology with the pathologist.  He will check to see if sufficient tissue is available for [facility] analysis If so, we might be able to admit bone marrow aspirate and biopsy I will see him next week once all test results are available In the meantime, due to uncontrolled pain, I recommend low-dose daily dexamethasone The risk, benefits, side effects of dexamethasone were discussed with the patient and he agreed to proceed We will get his case presented at the next hematology tumor board  Cancer associated pain I recommend addition of low-dose dexamethasone in addition to his pain medicine that was prescribed by his primary care doctor We discussed expected side effects of dexamethasone His neurosurgeon has actually refer him to see radiation oncologist to discuss the risk and benefits of radiation therapy  Other constipation He has mild constipation secondary to narcotic prescription We discussed laxative therapy  Hemochromatosis, hereditary We will recheck his ferritin level    Orders Placed This Encounter  Procedures  . NM PET Image Initial (PI) Whole Body    Standing Status:   Future    Standing Expiration Date:   04/03/2020    Order Specific Question:   If indicated for the ordered procedure, I authorize the administration of a radiopharmaceutical per Radiology protocol    Answer:   Yes    Order Specific Question:   Preferred imaging  location?    Answer:   [facility]    Order Specific Question:   Radiology Contrast Protocol - do NOT remove file path    Answer:   \\[email or web address]\[email or web address]  . CT BONE MARROW BIOPSY & ASPIRATION    Standing Status:   Future    Standing Expiration Date:   07/04/2020    Order Specific Question:   Reason for Exam (SYMPTOM  OR DIAGNOSIS REQUIRED)    Answer:   staging myeloma    Order Specific Question:   Preferred imaging location?    Answer:   [facility]    Order Specific Question:   Radiology Contrast Protocol - do NOT remove file path    Answer:   \\[email or web address]\Radiant\CTProtocols.pdf  . CT BIOPSY    Standing Status:   Future    Standing Expiration Date:   05/08/2020    Order Specific Question:   Reason for Exam (SYMPTOM  OR DIAGNOSIS REQUIRED)    Answer:   myeloma staging    Order Specific Question:   Preferred imaging location?    Answer:   [facility]  . Comprehensive metabolic panel    Standing Status:   Future    Standing Expiration Date:   05/08/2020  . CBC with Differential/Platelet    Standing Status:   Future    Standing Expiration Date:   05/08/2020  . Kappa/lambda light chains    Standing Status:   Future    Standing Expiration Date:   05/08/2020  . Multiple Myeloma Panel (SPEP&IFE w/QIG)    Standing Status:   Future    Standing Expiration  Date:   05/08/2020  . UPEP/UIFE/Light Chains/TP, 24-Hr Ur    Standing Status:   Future    Standing Expiration Date:   05/08/2020  . Beta 2 microglobulin, serum    Standing Status:   Future    Standing Expiration Date:   05/08/2020  . Uric acid    Standing Status:   Future    Standing Expiration Date:   05/08/2020  . VITAMIN D 25 Hydroxy (Vit-D Deficiency, Fractures)    Standing Status:   Future    Standing Expiration Date:   10/03/2020    INTERVAL HISTORY: Please see below for problem oriented charting. He is seen urgently due to recent diagnosis of plasma cell neoplasm He was originally  treated by myself for iron overload/hemochromatosis He has been complaining of worsening back pain since February after he relocated to a new home His pain did not improve much on ibuprofen and he rated his pain at most at 9 out of 10 With his current prescription pain medicine, it reduces his pain to 4 or 5 out of 10 He is not able to lie down flat because of the pain He has some mild constipation secondary to the prescription but was able to have somewhat regular bowel movement The pain is located in the left lower back radiating down to the buttock area and the back of his leg The area near the buttock is numb He has no urinary retention issue of falls   SUMMARY OF ONCOLOGIC HISTORY: Oncology History  Multiple myeloma without remission ([place])  03/20/2019 Imaging   1. Tumor involving the L5 and S1 and S2 segments of the spine as described with mass effects upon the left L4, L5, S1 and more distal left sacral nerves as described above. 2. Does the patient have a history of malignancy? 3. Multiple plasmacytomas and metastatic disease could give this appearance   03/27/2019 Pathology Results   Soft Tissue Needle Core Biopsy, sacral mass - PLASMABLASTIC NEOPLASM. - SEE COMMENT. Microscopic Comment The sections show needle core biopsy fragments of soft tissue densely infiltrated by a relatively monomorphic infiltrate of atypical plasmacytoid cells characterized by vesicular or partially clumped chromatin and prominent nucleoli. This is associated with scattered mitosis. A battery of immunohistochemical stains was performed and show that the atypical plasmacytoid cells are positive for CD138, CD43 and cytoplasmic kappa. There is also weak positivity for CD56 and partial variable positivity for cyclin D1. The atypical plasmacytoid cells are negative for cytoplasmic lambda, CD10, PAX5, CD79a, CD20, CD3, CD5, CD34, EBV (ISH) and mostly negative for LCA. The overall morphologic and histologic features  are most compatible with a plasmablastic neoplasm. The differential diagnosis includes plasmablastic lymphoma and plasmablastic plasmacytoma/myeloma. Based on the overall phenotypic features and the clinical setting, plasmacytoma/myeloma is favored. Clinical correlation and hematologic evaluation is recommended   03/27/2019 Procedure   Technically successful CT-guided biopsy of sacral soft tissue mass.   04/04/2019 Initial Diagnosis   Multiple myeloma without remission ([facility])     REVIEW OF SYSTEMS:   Constitutional: Denies fevers, chills or abnormal weight loss Eyes: Denies blurriness of vision Ears, nose, mouth, throat, and face: Denies mucositis or sore throat Respiratory: Denies cough, dyspnea or wheezes Cardiovascular: Denies palpitation, chest discomfort or lower extremity swelling Gastrointestinal:  Denies nausea, heartburn or change in bowel habits Skin: Denies abnormal skin rashes Lymphatics: Denies new lymphadenopathy or easy bruising Behavioral/Psych: Mood is stable, no new changes  All other systems were reviewed with the patient and are negative.  I have reviewed the past medical history, past surgical history, social history and family history with the patient and they are unchanged from previous note.  ALLERGIES:  has No Known Allergies.  MEDICATIONS:  Current Outpatient Medications  Medication Sig Dispense Refill  . acetaminophen (TYLENOL) 500 MG tablet Take 500 mg by mouth every 6 (six) hours as needed.    . Celecoxib (CELEBREX PO) Take 1 tablet by mouth daily as needed.    Marland Kitchen dexamethasone (DECADRON) 4 MG tablet Take 1 tablet (4 mg total) by mouth daily. 30 tablet 0  . diclofenac sodium (VOLTAREN) 1 % GEL Apply 2 g topically 4 (four) times daily as needed.    Marland Kitchen HYDROcodone-acetaminophen (NORCO) 5-325 MG tablet Take 1 tablet by mouth every 6 (six) hours as needed for moderate pain. 50 tablet 0  . lisinopril-hydrochlorothiazide (PRINZIDE,ZESTORETIC) 20-12.5 MG tablet TAKE  1 TABLET BY MOUTH ONCE DAILY 90 tablet 3   No current facility-administered medications for this visit.     PHYSICAL EXAMINATION: ECOG PERFORMANCE STATUS: 1 - Symptomatic but completely ambulatory  Vitals:   04/04/19 0804  BP: (!) 154/87  Pulse: (!) 101  Resp: 18  Temp: 99.1 F (37.3 C)  SpO2: 97%   Filed Weights   04/04/19 0804  Weight: 227 lb (103 kg)    GENERAL:alert, no distress and comfortable SKIN: skin color, texture, turgor are normal, no rashes or significant lesions EYES: normal, Conjunctiva are pink and non-injected, sclera clear OROPHARYNX:no exudate, no erythema and lips, buccal mucosa, and tongue normal  NECK: supple, thyroid normal size, non-tender, without nodularity LYMPH:  no palpable lymphadenopathy in the cervical, axillary or inguinal LUNGS: clear to auscultation and percussion with normal breathing effort HEART: regular rate & rhythm and no murmurs and no lower extremity edema ABDOMEN:abdomen soft, non-tender and normal bowel sounds Musculoskeletal:no cyanosis of digits and no clubbing  NEURO: alert & oriented x 3 with fluent speech, no focal motor/sensory deficits  LABORATORY DATA:  I have reviewed the data as listed    Component Value Date/Time   NA 134 (L) 03/27/2019 0738   NA 139 10/11/2018 1115   NA 139 04/05/2014 0849   K 3.9 03/27/2019 0738   K 4.4 04/05/2014 0849   CL 99 03/27/2019 0738   CO2 24 03/27/2019 0738   CO2 23 04/05/2014 0849   GLUCOSE 110 (H) 03/27/2019 0738   GLUCOSE 109 04/05/2014 0849   BUN 19 03/27/2019 0738   BUN 23 10/11/2018 1115   BUN 16.8 04/05/2014 0849   CREATININE 0.75 03/27/2019 0738   CREATININE 0.91 07/29/2017 1004   CREATININE 0.9 04/05/2014 0849   CALCIUM 9.5 03/27/2019 0738   CALCIUM 8.7 04/05/2014 0849   PROT 7.2 10/11/2018 1115   PROT 6.7 04/05/2014 0849   ALBUMIN 4.7 10/11/2018 1115   ALBUMIN 3.8 04/05/2014 0849   AST 30 10/11/2018 1115   AST 26 04/05/2014 0849   ALT 43 10/11/2018 1115   ALT  30 04/05/2014 0849   ALKPHOS 73 10/11/2018 1115   ALKPHOS 75 04/05/2014 0849   BILITOT 1.2 10/11/2018 1115   BILITOT 0.71 04/05/2014 0849   GFRNONAA >60 03/27/2019 0738   GFRAA >60 03/27/2019 0738    No results found for: SPEP, UPEP  Lab Results  Component Value Date   WBC 6.0 03/27/2019   NEUTROABS 3.3 03/27/2019   HGB 14.8 03/27/2019   HCT 44.4 03/27/2019   MCV 101.1 (H) 03/27/2019   PLT 267 03/27/2019      Chemistry  Component Value Date/Time   NA 134 (L) 03/27/2019 0738   NA 139 10/11/2018 1115   NA 139 04/05/2014 0849   K 3.9 03/27/2019 0738   K 4.4 04/05/2014 0849   CL 99 03/27/2019 0738   CO2 24 03/27/2019 0738   CO2 23 04/05/2014 0849   BUN 19 03/27/2019 0738   BUN 23 10/11/2018 1115   BUN 16.8 04/05/2014 0849   CREATININE 0.75 03/27/2019 0738   CREATININE 0.91 07/29/2017 1004   CREATININE 0.9 04/05/2014 0849   GLU 10 04/10/2014      Component Value Date/Time   CALCIUM 9.5 03/27/2019 0738   CALCIUM 8.7 04/05/2014 0849   ALKPHOS 73 10/11/2018 1115   ALKPHOS 75 04/05/2014 0849   AST 30 10/11/2018 1115   AST 26 04/05/2014 0849   ALT 43 10/11/2018 1115   ALT 30 04/05/2014 0849   BILITOT 1.2 10/11/2018 1115   BILITOT 0.71 04/05/2014 0849       RADIOGRAPHIC STUDIES: I have personally reviewed the radiological images as listed and agreed with the findings in the report. Mr Lumbar Spine Wo Contrast  Result Date: 03/20/2019 CLINICAL DATA:  Low back pain and left buttock and leg pain since March 2020. EXAM: MRI LUMBAR SPINE WITHOUT CONTRAST TECHNIQUE: Multiplanar, multisequence MR imaging of the lumbar spine was performed. No intravenous contrast was administered. COMPARISON:  Lumbar radiographs dated 11/24/2018 FINDINGS: Segmentation:  Standard. Alignment:  Minimal retrolisthesis of L2 on L3 and of L3 on L4. Vertebrae: There is an expansile mass involving the left side of the L5 vertebral body destroying the pedicle and inferior and superior facets. This  mass measures 6 x 5 x 3.3 cm. The mass extends into the left psoas muscle. There is a 5 x 5.4 x 4.0 cm expansile mass involving the S1 and S2 segments of the sacrum to the left of midline. This mass extends into the sacral spinal canal and deviates the thecal sac to the right at S1-2. Conus medullaris and cauda equina: Conus extends to the L1-2 level. Conus and cauda equina appear normal. Paraspinal and other soft tissues: The masses at L5 and S1 due extending into the adjacent soft tissues. The bladder is distended with an irregular bladder wall and small bladder diverticula. This suggests chronic bladder outlet obstruction. Disc levels: L1-2: Tiny disc bulge to the right of midline with no neural impingement. Disc desiccation. L2-3: Disc desiccation with slight retrolisthesis. Small broad-based disc bulge asymmetric into the left neural foramen and lateral to it with a slight mass effect upon the left L2 nerve lateral to the neural foramen best seen on image 17 of series 10. L3-4: Small broad-based soft disc protrusion with slight compression of the thecal sac without focal neural impingement. L4-5: Disc space narrowing with disc desiccation. No disc bulging or protrusion. Tumor in the left side of the L5 vertebral body does extend into the left paraspinal soft tissues and surrounds the left L4 nerve best seen on image 31 of series 10. L5-S1: The tumor in the left side of L5 extends into the left lateral recess and left neural foramen compressing the left L5 nerve in the neural foramen. S1-2: The tumor in the left side of S1 on S2 extends into the sacral spinal canal and deviates the thecal sac to the right which has a mass effect upon the left S1 nerve root sleeve and should affect the more distal left sacral nerves. IMPRESSION: 1. Tumor involving the L5 and S1 and S2 segments of  the spine as described with mass effects upon the left L4, L5, S1 and more distal left sacral nerves as described above. 2. Does the  patient have a history of malignancy? 3. 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 Electronically Signed   By: Lorriane Shire M.D.   On: 03/20/2019 17:51   Ct Biopsy  Result Date: 03/27/2019 INDICATION: 68 year old male with lumbar and sacral lytic osseous lesions concerning for either multiple myeloma or metastatic disease from an uncertain primary. EXAM: CT BIOPSY MEDICATIONS: None. ANESTHESIA/SEDATION: Moderate (conscious) sedation was employed during this procedure. A total of Versed 3 mg and Fentanyl 100 mcg was administered intravenously. Moderate Sedation Time: 11 minutes. The patient's level of consciousness and vital signs were monitored continuously by radiology nursing throughout the procedure under my direct supervision. FLUOROSCOPY TIME:  None COMPLICATIONS: None immediate. PROCEDURE: Informed written consent was obtained from the patient after a thorough discussion of the procedural risks, benefits and alternatives. All questions were addressed. A timeout was performed prior to the initiation of the procedure. A planning axial CT scan was performed. The lesion in the left aspect of the superior sacrum was localized. A suitable skin entry site was selected and marked. The skin was sterilely prepped and draped in the standard fashion using chlorhexidine skin prep. Local anesthesia was attained by infiltration with 1% lidocaine. A small dermatotomy was made. Under intermittent CT guidance, a 17 gauge introducer needle was advanced and positioned at the margin of the mass. Multiple 18 gauge core biopsies were then obtained coaxially. Biopsy specimens were placed in both formalin and saline and delivered to pathology for further analysis. Biopsy device and introducer needle were removed. Hemostasis was attained by manual pressure. IMPRESSION: Technically successful CT-guided biopsy of sacral soft tissue mass. Electronically Signed   By: Jacqulynn Cadet M.D.   On:  03/27/2019 09:58    All questions were answered. The patient knows to call the clinic with any problems, questions or concerns. No barriers to learning was detected.  I spent 40 minutes counseling the patient face to face. The total time spent in the appointment was 55 minutes and more than 50% was on counseling and review of test results  Heath Lark, MD 04/04/2019 12:33 PM

## 2019-04-04 NOTE — Telephone Encounter (Signed)
I talk with patient regarding schedule  

## 2019-04-04 NOTE — Assessment & Plan Note (Signed)
I have reviewed the plan of care with the patient We discussed the importance of staging We discussed the differences in treatment, between plasmacytoma versus multiple myeloma I recommend bone marrow aspirate and biopsy, PET CT scan, blood work and complete 24-hour urine collection I have reviewed the pathology with the pathologist.  He will check to see if sufficient tissue is available for [facility] analysis If so, we might be able to admit bone marrow aspirate and biopsy I will see him next week once all test results are available In the meantime, due to uncontrolled pain, I recommend low-dose daily dexamethasone The risk, benefits, side effects of dexamethasone were discussed with the patient and he agreed to proceed We will get his case presented at the next hematology tumor board

## 2019-04-04 NOTE — Assessment & Plan Note (Signed)
He has mild constipation secondary to narcotic prescription We discussed laxative therapy

## 2019-04-04 NOTE — Telephone Encounter (Signed)
Scheduled appt per 6/30 sch message - unable to reach pt .left message with appt date and time

## 2019-04-06 ENCOUNTER — Inpatient Hospital Stay: Payer: Medicare Other | Attending: Hematology and Oncology

## 2019-04-06 ENCOUNTER — Encounter: Payer: Self-pay | Admitting: Hematology and Oncology

## 2019-04-06 ENCOUNTER — Other Ambulatory Visit: Payer: Self-pay

## 2019-04-06 DIAGNOSIS — Z7982 Long term (current) use of aspirin: Secondary | ICD-10-CM | POA: Diagnosis not present

## 2019-04-06 DIAGNOSIS — K5900 Constipation, unspecified: Secondary | ICD-10-CM | POA: Insufficient documentation

## 2019-04-06 DIAGNOSIS — C9000 Multiple myeloma not having achieved remission: Secondary | ICD-10-CM | POA: Insufficient documentation

## 2019-04-06 DIAGNOSIS — R200 Anesthesia of skin: Secondary | ICD-10-CM | POA: Diagnosis not present

## 2019-04-06 DIAGNOSIS — M545 Low back pain: Secondary | ICD-10-CM | POA: Insufficient documentation

## 2019-04-06 DIAGNOSIS — E559 Vitamin D deficiency, unspecified: Secondary | ICD-10-CM

## 2019-04-06 DIAGNOSIS — I700 Atherosclerosis of aorta: Secondary | ICD-10-CM | POA: Insufficient documentation

## 2019-04-06 DIAGNOSIS — G893 Neoplasm related pain (acute) (chronic): Secondary | ICD-10-CM | POA: Insufficient documentation

## 2019-04-06 DIAGNOSIS — Z79899 Other long term (current) drug therapy: Secondary | ICD-10-CM | POA: Diagnosis not present

## 2019-04-06 LAB — CBC WITH DIFFERENTIAL/PLATELET
Abs Immature Granulocytes: 0.02 10*3/uL (ref 0.00–0.07)
Basophils Absolute: 0 10*3/uL (ref 0.0–0.1)
Basophils Relative: 1 %
Eosinophils Absolute: 0 10*3/uL (ref 0.0–0.5)
Eosinophils Relative: 0 %
HCT: 43 % (ref 39.0–52.0)
Hemoglobin: 14.8 g/dL (ref 13.0–17.0)
Immature Granulocytes: 0 %
Lymphocytes Relative: 17 %
Lymphs Abs: 1.3 10*3/uL (ref 0.7–4.0)
MCH: 33.9 pg (ref 26.0–34.0)
MCHC: 34.4 g/dL (ref 30.0–36.0)
MCV: 98.4 fL (ref 80.0–100.0)
Monocytes Absolute: 0.8 10*3/uL (ref 0.1–1.0)
Monocytes Relative: 11 %
Neutro Abs: 5.5 10*3/uL (ref 1.7–7.7)
Neutrophils Relative %: 71 %
Platelets: 272 10*3/uL (ref 150–400)
RBC: 4.37 MIL/uL (ref 4.22–5.81)
RDW: 12.4 % (ref 11.5–15.5)
WBC: 7.7 10*3/uL (ref 4.0–10.5)
nRBC: 0 % (ref 0.0–0.2)

## 2019-04-06 LAB — CMP (CANCER CENTER ONLY)
ALT: 29 U/L (ref 0–44)
AST: 25 U/L (ref 15–41)
Albumin: 3.8 g/dL (ref 3.5–5.0)
Alkaline Phosphatase: 56 U/L (ref 38–126)
Anion gap: 9 (ref 5–15)
BUN: 21 mg/dL (ref 8–23)
CO2: 22 mmol/L (ref 22–32)
Calcium: 9.1 mg/dL (ref 8.9–10.3)
Chloride: 102 mmol/L (ref 98–111)
Creatinine: 0.84 mg/dL (ref 0.61–1.24)
GFR, Est AFR Am: >60 mL/min (ref >60)
GFR, Estimated: >60 mL/min (ref >60)
Glucose, Bld: 122 mg/dL — ABNORMAL HIGH (ref 70–99)
Potassium: 3.7 mmol/L (ref 3.5–5.1)
Sodium: 133 mmol/L — ABNORMAL LOW (ref 135–145)
Total Bilirubin: 0.7 mg/dL (ref 0.3–1.2)
Total Protein: 8 g/dL (ref 6.5–8.1)

## 2019-04-06 LAB — URIC ACID: Uric Acid, Serum: 5 mg/dL (ref 3.7–8.6)

## 2019-04-07 LAB — VITAMIN D 25 HYDROXY (VIT D DEFICIENCY, FRACTURES): Vit D, 25-Hydroxy: 31.7 ng/mL (ref 30.0–100.0)

## 2019-04-07 LAB — KAPPA/LAMBDA LIGHT CHAINS
Kappa free light chain: 104.2 mg/L — ABNORMAL HIGH (ref 3.3–19.4)
Kappa, lambda light chain ratio: 9.92 — ABNORMAL HIGH (ref 0.26–1.65)
Lambda free light chains: 10.5 mg/L (ref 5.7–26.3)

## 2019-04-07 LAB — BETA 2 MICROGLOBULIN, SERUM: Beta-2 Microglobulin: 2 mg/L (ref 0.6–2.4)

## 2019-04-10 LAB — MULTIPLE MYELOMA PANEL, SERUM
Albumin SerPl Elph-Mcnc: 3.8 g/dL (ref 2.9–4.4)
Albumin/Glob SerPl: 1.1 (ref 0.7–1.7)
Alpha 1: 0.2 g/dL (ref 0.0–0.4)
Alpha2 Glob SerPl Elph-Mcnc: 0.6 g/dL (ref 0.4–1.0)
B-Globulin SerPl Elph-Mcnc: 1 g/dL (ref 0.7–1.3)
Gamma Glob SerPl Elph-Mcnc: 1.7 g/dL (ref 0.4–1.8)
Globulin, Total: 3.5 g/dL (ref 2.2–3.9)
IgA: 156 mg/dL (ref 61–437)
IgG (Immunoglobin G), Serum: 2369 mg/dL — ABNORMAL HIGH (ref 603–1613)
IgM (Immunoglobulin M), Srm: 37 mg/dL (ref 20–172)
M Protein SerPl Elph-Mcnc: 1.5 g/dL — ABNORMAL HIGH
Total Protein ELP: 7.3 g/dL (ref 6.0–8.5)

## 2019-04-11 ENCOUNTER — Other Ambulatory Visit: Payer: Self-pay

## 2019-04-11 ENCOUNTER — Ambulatory Visit (HOSPITAL_COMMUNITY)
Admission: RE | Admit: 2019-04-11 | Discharge: 2019-04-11 | Disposition: A | Discharge status: Home / Self Care | Payer: Medicare Other | Source: Ambulatory Visit | Attending: Hematology and Oncology | Admitting: Hematology and Oncology

## 2019-04-11 DIAGNOSIS — C9000 Multiple myeloma not having achieved remission: Secondary | ICD-10-CM | POA: Diagnosis not present

## 2019-04-11 DIAGNOSIS — I2510 Atherosclerotic heart disease of native coronary artery without angina pectoris: Secondary | ICD-10-CM | POA: Diagnosis not present

## 2019-04-11 LAB — UPEP/UIFE/LIGHT CHAINS/TP, 24-HR UR
% BETA, Urine: 23.1 %
ALPHA 1 URINE: 6.9 %
Albumin, U: 21 %
Alpha 2, Urine: 19.3 %
Free Kappa Lt Chains,Ur: 95.34 mg/L (ref 0.63–113.79)
Free Kappa/Lambda Ratio: 70.1 — ABNORMAL HIGH (ref 1.03–31.76)
Free Lambda Lt Chains,Ur: 1.36 mg/L (ref 0.47–11.77)
GAMMA GLOBULIN URINE: 29.7 %
M-SPIKE %, Urine: 15.4 % — ABNORMAL HIGH
M-Spike, Mg/24 Hr: 16 mg/24 hr — ABNORMAL HIGH
Total Protein, Urine-Ur/day: 102 mg/24 hr (ref 30–150)
Total Protein, Urine: 6 mg/dL
Total Volume: 1700

## 2019-04-11 LAB — GLUCOSE, CAPILLARY: Glucose-Capillary: 109 mg/dL — ABNORMAL HIGH (ref 70–99)

## 2019-04-11 MED ORDER — FLUDEOXYGLUCOSE F - 18 (FDG) INJECTION
11.3000 | Freq: Once | INTRAVENOUS | Status: AC | PRN
  Administered 2019-04-11: 11.3 via INTRAVENOUS

## 2019-04-12 ENCOUNTER — Other Ambulatory Visit: Payer: Self-pay

## 2019-04-12 ENCOUNTER — Inpatient Hospital Stay: Payer: Medicare Other

## 2019-04-12 ENCOUNTER — Inpatient Hospital Stay (HOSPITAL_BASED_OUTPATIENT_CLINIC_OR_DEPARTMENT_OTHER): Payer: Medicare Other | Admitting: Hematology and Oncology

## 2019-04-12 ENCOUNTER — Telehealth: Payer: Self-pay | Admitting: Pharmacist

## 2019-04-12 ENCOUNTER — Telehealth: Payer: Self-pay

## 2019-04-12 ENCOUNTER — Other Ambulatory Visit: Payer: Self-pay | Admitting: Student

## 2019-04-12 ENCOUNTER — Encounter: Payer: Self-pay | Admitting: Hematology and Oncology

## 2019-04-12 VITALS — BP 159/99 | HR 77 | Temp 98.5°F | Resp 18 | Ht 69.0 in | Wt 224.0 lb

## 2019-04-12 DIAGNOSIS — K5909 Other constipation: Secondary | ICD-10-CM

## 2019-04-12 DIAGNOSIS — C9000 Multiple myeloma not having achieved remission: Secondary | ICD-10-CM

## 2019-04-12 DIAGNOSIS — Z79899 Other long term (current) drug therapy: Secondary | ICD-10-CM

## 2019-04-12 DIAGNOSIS — Z7982 Long term (current) use of aspirin: Secondary | ICD-10-CM

## 2019-04-12 DIAGNOSIS — K5900 Constipation, unspecified: Secondary | ICD-10-CM | POA: Diagnosis not present

## 2019-04-12 DIAGNOSIS — G893 Neoplasm related pain (acute) (chronic): Secondary | ICD-10-CM | POA: Diagnosis not present

## 2019-04-12 DIAGNOSIS — R200 Anesthesia of skin: Secondary | ICD-10-CM | POA: Diagnosis not present

## 2019-04-12 DIAGNOSIS — M545 Low back pain: Secondary | ICD-10-CM | POA: Diagnosis not present

## 2019-04-12 DIAGNOSIS — I700 Atherosclerosis of aorta: Secondary | ICD-10-CM

## 2019-04-12 DIAGNOSIS — Z7189 Other specified counseling: Secondary | ICD-10-CM | POA: Insufficient documentation

## 2019-04-12 MED ORDER — ACYCLOVIR 400 MG PO TABS: 400.0000 mg | ORAL_TABLET | Freq: Two times a day (BID) | ORAL | 3 refills | Status: DC

## 2019-04-12 MED ORDER — ONDANSETRON HCL 8 MG PO TABS: 8.0000 mg | ORAL_TABLET | Freq: Two times a day (BID) | ORAL | 1 refills | Status: DC | PRN

## 2019-04-12 MED ORDER — LENALIDOMIDE 15 MG PO CAPS: ORAL_CAPSULE | ORAL | 0 refills | Status: DC

## 2019-04-12 MED ORDER — PROCHLORPERAZINE MALEATE 10 MG PO TABS: 10.0000 mg | ORAL_TABLET | Freq: Four times a day (QID) | ORAL | 1 refills | Status: DC | PRN

## 2019-04-12 MED ORDER — LENALIDOMIDE 25 MG PO CAPS: ORAL_CAPSULE | ORAL | 3 refills | Status: DC

## 2019-04-12 MED ORDER — HYDROCODONE-ACETAMINOPHEN 5-325 MG PO TABS: 1.0000 | ORAL_TABLET | Freq: Four times a day (QID) | ORAL | 0 refills | Status: DC | PRN

## 2019-04-12 NOTE — Assessment & Plan Note (Signed)
While multiple myeloma is highly treatable, at this point in time, it is considered noncurable We will treat him aggressively since this is young with relatively good health He would be a transplant candidate

## 2019-04-12 NOTE — Assessment & Plan Note (Signed)
He has mild persistent constipation from his narcotic prescription We discussed laxative therapy

## 2019-04-12 NOTE — Assessment & Plan Note (Signed)
I recommend addition of low-dose dexamethasone in addition to his pain medicine that was prescribed by his primary care doctor So far, he tolerated very well I refilled his prescription pain medicine I will take over his chronic pain management We discussed narcotic refill policy

## 2019-04-12 NOTE — Telephone Encounter (Addendum)
Oral Oncology Patient Advocate Encounter  Prior Authorization for Revlimid has been approved.    PA# [id number] Effective dates: 01/12/19 through 04/11/20  Oral Oncology Clinic will continue to follow.   [place] Patient [place] Phone [phone] Fax [phone] 04/12/2019    4:01 PM

## 2019-04-12 NOTE — Telephone Encounter (Signed)
Oral Oncology Pharmacist Encounter  Received new prescription for Revlimid (lenalidomide) for the treatment of multiple myeloma not having achieved remission in conjunction with bortezomib and dexamethasone, planned duration 4-6 cycles of induction therapy and the reassessment.  Revlimid is planned to be administered at 15 mg by mouth once daily for 14 days on, 7 days off, repeated every 21 days Bortezomib is planned to be administered at 1.3 mg/m2 SQ on days 1, 4, 7, and 11 of each 21 day cycle I will follow-up with MD for dexamethasone plan  Labs from 04/06/2019 assessed, OK for treatment intiation.  Current medication list in Epic reviewed, no DDIs with Revlimid identified.  I will ensure patient is starting acyclovir 400 mg by mouth twice daily for VZV prophylaxis and aspirin 25m daily fr thromboprophylaxis when I perform initial counseling.  Prescription for Revlimid will be e-scribed to appropriate specialty pharmacy for dispensing once insurance authorization is approved. [facility].  Oral Oncology Clinic will continue to follow for insurance authorization, copayment issues, initial counseling and start date.  JJohny Drilling PharmD, BCPS, BCOP  04/12/2019 2:47 PM Oral Oncology Clinic [phone]

## 2019-04-12 NOTE — Assessment & Plan Note (Signed)
I will continue to check his ferritin level periodically

## 2019-04-12 NOTE — Assessment & Plan Note (Signed)
I have a long discussion with the patient and his partner over the telephone I review all the test results including his blood work, urine test and imaging studies His case was also presented at the hematology tumor board Overall, he has diagnosis of multiple myeloma Bone marrow biopsy is indicated and pending for Friday We discussed general approach to multiple myeloma I introduced the concept of induction chemotherapy followed by possibility of autologous stem cell transplant in the future  We discussed the current guidelines The patient is a transplant candidate I recommend combination chemotherapy with Velcade, lenalidomide and dexamethasone For now, I recommend daily dexamethasone as it helps with his pain Next week, I plan to change his dexamethasone to weekly He will get Velcade on days 1, 4, 7 and 11 for cycle of every 21 days He will also be prescribed Revlimid at 15 mg daily for days 1-14, rest 7 days for cycle of every 21 days I recommend aspirin therapy I recommend acyclovir for antimicrobial prophylaxis I recommend calcium with vitamin D He has appointment to see his dentist next week for dental clearance before I prescribe Zometa Due to lack of progression of symptoms, I do not recommend radiation therapy right now but rather to focus on palliative chemotherapy He understood that while multiple myeloma is highly treatable, at this point in time, it is not considered curable I will schedule appointment to see bone marrow transplant team when his final bone marrow results are available I will see him next week before the start of cycle 1 to answer other questions

## 2019-04-12 NOTE — Telephone Encounter (Signed)
Oral Oncology Patient Advocate Encounter  Received notification from [employer] that prior authorization for Revlimid is required.  Attempted to submit PA on CoverMyMeds, received a message that I needed to call [phone] in order to complete the PA.  I called this number and the representative was not able to pull up clinical questions, she has routed it to a clinical pharmacist to review and call me for questions.  Case # [id number] for the call Status is pending  Oral Oncology Clinic will continue to follow.  [place] Patient [place] Phone [phone] Fax [phone] 04/12/2019    3:44 PM

## 2019-04-12 NOTE — Progress Notes (Signed)
[place] OFFICE PROGRESS NOTE  Patient Care Team: Denita Lung, MD as PCP - General (Family Medicine) Heath Lark, MD as Consulting Physician (Hematology and Oncology)  ASSESSMENT & PLAN:  Multiple myeloma without remission [facility]) I have a long discussion with the patient and his partner over the telephone I review all the test results including his blood work, urine test and imaging studies His case was also presented at the hematology tumor board Overall, he has diagnosis of multiple myeloma Bone marrow biopsy is indicated and pending for Friday We discussed general approach to multiple myeloma I introduced the concept of induction chemotherapy followed by possibility of autologous stem cell transplant in the future  We discussed the current guidelines The patient is a transplant candidate I recommend combination chemotherapy with Velcade, lenalidomide and dexamethasone For now, I recommend daily dexamethasone as it helps with his pain Next week, I plan to change his dexamethasone to weekly He will get Velcade on days 1, 4, 7 and 11 for cycle of every 21 days He will also be prescribed Revlimid at 15 mg daily for days 1-14, rest 7 days for cycle of every 21 days I recommend aspirin therapy I recommend acyclovir for antimicrobial prophylaxis I recommend calcium with vitamin D He has appointment to see his dentist next week for dental clearance before I prescribe Zometa Due to lack of progression of symptoms, I do not recommend radiation therapy right now but rather to focus on palliative chemotherapy He understood that while multiple myeloma is highly treatable, at this point in time, it is not considered curable I will schedule appointment to see bone marrow transplant team when his final bone marrow results are available I will see him next week before the start of cycle 1 to answer other questions    Cancer associated pain I recommend addition of low-dose  dexamethasone in addition to his pain medicine that was prescribed by his primary care doctor So far, he tolerated very well I refilled his prescription pain medicine I will take over his chronic pain management We discussed narcotic refill policy  Other constipation He has mild persistent constipation from his narcotic prescription We discussed laxative therapy  Hemochromatosis, hereditary I will continue to check his ferritin level periodically  Goals of care, counseling/discussion While multiple myeloma is highly treatable, at this point in time, it is considered noncurable We will treat him aggressively since this is young with relatively good health He would be a transplant candidate   Orders Placed This Encounter  Procedures  . CBC with Differential (Cancer Center Only)    Standing Status:   Standing    Number of Occurrences:   20    Standing Expiration Date:   04/11/2020  . CMP ([place] only)    Standing Status:   Standing    Number of Occurrences:   20    Standing Expiration Date:   04/11/2020    INTERVAL HISTORY: Please see below for problem oriented charting. He returns to review test results I also have the opportunity to discuss with his partner, run over the telephone Since last time I saw him, his pain control has improved He still have some mild numbness in the sacral region but no progression of neurological deficit He has persistent mild constipation  SUMMARY OF ONCOLOGIC HISTORY: Oncology History  Multiple myeloma without remission ([place])  03/20/2019 Imaging   1. Tumor involving the L5 and S1 and S2 segments of the spine as described with  mass effects upon the left L4, L5, S1 and more distal left sacral nerves as described above. 2. Does the patient have a history of malignancy? 3. Multiple plasmacytomas and metastatic disease could give this appearance   03/27/2019 Pathology Results   Soft Tissue Needle Core Biopsy, sacral mass - PLASMABLASTIC  NEOPLASM. - SEE COMMENT. Microscopic Comment The sections show needle core biopsy fragments of soft tissue densely infiltrated by a relatively monomorphic infiltrate of atypical plasmacytoid cells characterized by vesicular or partially clumped chromatin and prominent nucleoli. This is associated with scattered mitosis. A battery of immunohistochemical stains was performed and show that the atypical plasmacytoid cells are positive for CD138, CD43 and cytoplasmic kappa. There is also weak positivity for CD56 and partial variable positivity for cyclin D1. The atypical plasmacytoid cells are negative for cytoplasmic lambda, CD10, PAX5, CD79a, CD20, CD3, CD5, CD34, EBV (ISH) and mostly negative for LCA. The overall morphologic and histologic features are most compatible with a plasmablastic neoplasm. The differential diagnosis includes plasmablastic lymphoma and plasmablastic plasmacytoma/myeloma. Based on the overall phenotypic features and the clinical setting, plasmacytoma/myeloma is favored. Clinical correlation and hematologic evaluation is recommended   03/27/2019 Procedure   Technically successful CT-guided biopsy of sacral soft tissue mass.   04/04/2019 Initial Diagnosis   Multiple myeloma without remission ([place])   04/11/2019 PET scan   IMPRESSION: 1. Previously noted expansile lesions involving the L5 vertebra and sacrum are again noted and exhibit intense FDG uptake compatible with metabolically active disease. A third focus of increased uptake without corresponding CT abnormality is noted within the intertrochanteric portions of the proximal right femur. 2. Small lucent lesion within the T3 vertebra is noted without corresponding FDG uptake. 3. Asymmetric left bladder wall thickening, etiology indeterminate. 4. Aortic Atherosclerosis (ICD10-I70.0). Lad coronary artery calcification.   04/12/2019 Cancer Staging   Staging form: Plasma Cell Myeloma and Plasma Cell Disorders, AJCC 8th Edition -  Clinical stage from 04/12/2019: Beta-2-microglobulin (mg/L): 2, Albumin (g/dL): 3.6, ISS: Stage I, High-risk cytogenetics: Unknown, LDH: Unknown - Signed by Heath Lark, MD on 04/12/2019   04/18/2019 -  Chemotherapy   The patient had bortezomib SQ (VELCADE) chemo injection 3 mg, 1.3 mg/m2 = 3 mg, Subcutaneous,  Once, 0 of 4 cycles  for chemotherapy treatment.      REVIEW OF SYSTEMS:   Constitutional: Denies fevers, chills or abnormal weight loss Eyes: Denies blurriness of vision Ears, nose, mouth, throat, and face: Denies mucositis or sore throat Respiratory: Denies cough, dyspnea or wheezes Cardiovascular: Denies palpitation, chest discomfort or lower extremity swelling Skin: Denies abnormal skin rashes Lymphatics: Denies new lymphadenopathy or easy bruising Neurological:Denies numbness, tingling or new weaknesses Behavioral/Psych: Mood is stable, no new changes  All other systems were reviewed with the patient and are negative.  I have reviewed the past medical history, past surgical history, social history and family history with the patient and they are unchanged from previous note.  ALLERGIES:  has No Known Allergies.  MEDICATIONS:  Current Outpatient Medications  Medication Sig Dispense Refill  . acetaminophen (TYLENOL) 500 MG tablet Take 500 mg by mouth every 6 (six) hours as needed.    Marland Kitchen acyclovir (ZOVIRAX) 400 MG tablet Take 1 tablet (400 mg total) by mouth 2 (two) times daily. 60 tablet 3  . dexamethasone (DECADRON) 4 MG tablet Take 1 tablet (4 mg total) by mouth daily. 30 tablet 0  . diclofenac sodium (VOLTAREN) 1 % GEL Apply 2 g topically 4 (four) times daily as needed.    Marland Kitchen  HYDROcodone-acetaminophen (NORCO) 5-325 MG tablet Take 1 tablet by mouth every 6 (six) hours as needed for moderate pain. 60 tablet 0  . lenalidomide (REVLIMID) 15 MG capsule Take 1 capsule daily on days 1-14 every 21 days. 14 capsule 0  . lisinopril-hydrochlorothiazide (PRINZIDE,ZESTORETIC) 20-12.5 MG  tablet TAKE 1 TABLET BY MOUTH ONCE DAILY 90 tablet 3  . ondansetron (ZOFRAN) 8 MG tablet Take 1 tablet (8 mg total) by mouth 2 (two) times daily as needed (Nausea or vomiting). 30 tablet 1  . prochlorperazine (COMPAZINE) 10 MG tablet Take 1 tablet (10 mg total) by mouth every 6 (six) hours as needed (Nausea or vomiting). 30 tablet 1   No current facility-administered medications for this visit.     PHYSICAL EXAMINATION: ECOG PERFORMANCE STATUS: 1 - Symptomatic but completely ambulatory  Vitals:   04/12/19 1130  BP: (!) 159/99  Pulse: 77  Resp: 18  Temp: 98.5 F (36.9 C)  SpO2: 97%   Filed Weights   04/12/19 1130  Weight: 224 lb (101.6 kg)    GENERAL:alert, no distress and comfortable' \Musculoskeletal' :no cyanosis of digits and no clubbing  NEURO: alert & oriented x 3 with fluent speech, no focal motor/sensory deficits  LABORATORY DATA:  I have reviewed the data as listed    Component Value Date/Time   NA 133 (L) 04/06/2019 0858   NA 139 10/11/2018 1115   NA 139 04/05/2014 0849   K 3.7 04/06/2019 0858   K 4.4 04/05/2014 0849   CL 102 04/06/2019 0858   CO2 22 04/06/2019 0858   CO2 23 04/05/2014 0849   GLUCOSE 122 (H) 04/06/2019 0858   GLUCOSE 109 04/05/2014 0849   BUN 21 04/06/2019 0858   BUN 23 10/11/2018 1115   BUN 16.8 04/05/2014 0849   CREATININE 0.84 04/06/2019 0858   CREATININE 0.91 07/29/2017 1004   CREATININE 0.9 04/05/2014 0849   CALCIUM 9.1 04/06/2019 0858   CALCIUM 8.7 04/05/2014 0849   PROT 8.0 04/06/2019 0858   PROT 7.2 10/11/2018 1115   PROT 6.7 04/05/2014 0849   ALBUMIN 3.8 04/06/2019 0858   ALBUMIN 4.7 10/11/2018 1115   ALBUMIN 3.8 04/05/2014 0849   AST 25 04/06/2019 0858   AST 26 04/05/2014 0849   ALT 29 04/06/2019 0858   ALT 30 04/05/2014 0849   ALKPHOS 56 04/06/2019 0858   ALKPHOS 75 04/05/2014 0849   BILITOT 0.7 04/06/2019 0858   BILITOT 0.71 04/05/2014 0849   GFRNONAA >60 04/06/2019 0858   GFRAA >60 04/06/2019 0858    No results  found for: SPEP, UPEP  Lab Results  Component Value Date   WBC 7.7 04/06/2019   NEUTROABS 5.5 04/06/2019   HGB 14.8 04/06/2019   HCT 43.0 04/06/2019   MCV 98.4 04/06/2019   PLT 272 04/06/2019      Chemistry      Component Value Date/Time   NA 133 (L) 04/06/2019 0858   NA 139 10/11/2018 1115   NA 139 04/05/2014 0849   K 3.7 04/06/2019 0858   K 4.4 04/05/2014 0849   CL 102 04/06/2019 0858   CO2 22 04/06/2019 0858   CO2 23 04/05/2014 0849   BUN 21 04/06/2019 0858   BUN 23 10/11/2018 1115   BUN 16.8 04/05/2014 0849   CREATININE 0.84 04/06/2019 0858   CREATININE 0.91 07/29/2017 1004   CREATININE 0.9 04/05/2014 0849   GLU 10 04/10/2014      Component Value Date/Time   CALCIUM 9.1 04/06/2019 0858   CALCIUM 8.7 04/05/2014 0849  ALKPHOS 56 04/06/2019 0858   ALKPHOS 75 04/05/2014 0849   AST 25 04/06/2019 0858   AST 26 04/05/2014 0849   ALT 29 04/06/2019 0858   ALT 30 04/05/2014 0849   BILITOT 0.7 04/06/2019 0858   BILITOT 0.71 04/05/2014 0849       RADIOGRAPHIC STUDIES: I have reviewed multiple imaging studies with the patient I have personally reviewed the radiological images as listed and agreed with the findings in the report. Mr Lumbar Spine Wo Contrast  Result Date: 03/20/2019 CLINICAL DATA:  Low back pain and left buttock and leg pain since March 2020. EXAM: MRI LUMBAR SPINE WITHOUT CONTRAST TECHNIQUE: Multiplanar, multisequence MR imaging of the lumbar spine was performed. No intravenous contrast was administered. COMPARISON:  Lumbar radiographs dated 11/24/2018 FINDINGS: Segmentation:  Standard. Alignment:  Minimal retrolisthesis of L2 on L3 and of L3 on L4. Vertebrae: There is an expansile mass involving the left side of the L5 vertebral body destroying the pedicle and inferior and superior facets. This mass measures 6 x 5 x 3.3 cm. The mass extends into the left psoas muscle. There is a 5 x 5.4 x 4.0 cm expansile mass involving the S1 and S2 segments of the sacrum  to the left of midline. This mass extends into the sacral spinal canal and deviates the thecal sac to the right at S1-2. Conus medullaris and cauda equina: Conus extends to the L1-2 level. Conus and cauda equina appear normal. Paraspinal and other soft tissues: The masses at L5 and S1 due extending into the adjacent soft tissues. The bladder is distended with an irregular bladder wall and small bladder diverticula. This suggests chronic bladder outlet obstruction. Disc levels: L1-2: Tiny disc bulge to the right of midline with no neural impingement. Disc desiccation. L2-3: Disc desiccation with slight retrolisthesis. Small broad-based disc bulge asymmetric into the left neural foramen and lateral to it with a slight mass effect upon the left L2 nerve lateral to the neural foramen best seen on image 17 of series 10. L3-4: Small broad-based soft disc protrusion with slight compression of the thecal sac without focal neural impingement. L4-5: Disc space narrowing with disc desiccation. No disc bulging or protrusion. Tumor in the left side of the L5 vertebral body does extend into the left paraspinal soft tissues and surrounds the left L4 nerve best seen on image 31 of series 10. L5-S1: The tumor in the left side of L5 extends into the left lateral recess and left neural foramen compressing the left L5 nerve in the neural foramen. S1-2: The tumor in the left side of S1 on S2 extends into the sacral spinal canal and deviates the thecal sac to the right which has a mass effect upon the left S1 nerve root sleeve and should affect the more distal left sacral nerves. IMPRESSION: 1. Tumor involving the L5 and S1 and S2 segments of the spine as described with mass effects upon the left L4, L5, S1 and more distal left sacral nerves as described above. 2. Does the patient have a history of malignancy? 3. 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 Electronically Signed   By: Lorriane Shire M.D.    On: 03/20/2019 17:51   Nm Pet Image Initial (pi) Whole Body  Result Date: 04/11/2019 CLINICAL DATA:  Initial treatment strategy for multiple myeloma. EXAM: NUCLEAR MEDICINE PET WHOLE BODY TECHNIQUE: 11.3 mCi F-18 FDG was injected intravenously. Full-ring PET imaging was performed from the skull base to thigh after the radiotracer.  CT data was obtained and used for attenuation correction and anatomic localization. Fasting blood glucose: 109 mg/dl COMPARISON:  MRI 03/20/2019 FINDINGS: Mediastinal blood pool activity: SUV max 3.04 HEAD/NECK: No hypermetabolic activity in the scalp. No hypermetabolic cervical lymph nodes. Incidental CT findings: none CHEST: No hypermetabolic mediastinal or hilar nodes. No suspicious pulmonary nodules on the CT scan. Incidental CT findings: Aortic atherosclerosis. Calcification in the LAD coronary artery noted. ABDOMEN/PELVIS: No abnormal hypermetabolic activity within the liver, pancreas, adrenal glands, or spleen. No hypermetabolic lymph nodes in the abdomen or pelvis. Asymmetric left bladder wall thickening is identified which measures 0.8 cm in thickness. Prostate gland enlargement noted. Aortic atherosclerosis. Incidental CT findings: none SKELETON: There is diffuse heterogeneous radiotracer activity within the bone marrow. Expansile mass involving the left side of the L5 vertebra is again noted. This measures approximately 5.8 cm and has an SUV max of 12.2. Second lesion involving S1 and S2, left of the midline measures 5.2 cm and has an SUV max of 11.8. Small lucent lesion is identified within the left side of the T3 vertebra measuring 8 mm. No significant FDG uptake identified within this lesion. Incidental CT findings: none EXTREMITIES: Within the intertrochanteric portions of the proximal right femur there is a small focus of increased uptake within SUV max of 5.31. No corresponding lytic or sclerotic bone lesion identified on the CT images. Incidental CT findings: none  IMPRESSION: 1. Previously noted expansile lesions involving the L5 vertebra and sacrum are again noted and exhibit intense FDG uptake compatible with metabolically active disease. A third focus of increased uptake without corresponding CT abnormality is noted within the intertrochanteric portions of the proximal right femur. 2. Small lucent lesion within the T3 vertebra is noted without corresponding FDG uptake. 3. Asymmetric left bladder wall thickening, etiology indeterminate. 4. Aortic Atherosclerosis (ICD10-I70.0). Lad coronary artery calcification. Electronically Signed   By: Kerby Moors M.D.   On: 04/11/2019 11:47   Ct Biopsy  Result Date: 03/27/2019 INDICATION: 68 year old male with lumbar and sacral lytic osseous lesions concerning for either multiple myeloma or metastatic disease from an uncertain primary. EXAM: CT BIOPSY MEDICATIONS: None. ANESTHESIA/SEDATION: Moderate (conscious) sedation was employed during this procedure. A total of Versed 3 mg and Fentanyl 100 mcg was administered intravenously. Moderate Sedation Time: 11 minutes. The patient's level of consciousness and vital signs were monitored continuously by radiology nursing throughout the procedure under my direct supervision. FLUOROSCOPY TIME:  None COMPLICATIONS: None immediate. PROCEDURE: Informed written consent was obtained from the patient after a thorough discussion of the procedural risks, benefits and alternatives. All questions were addressed. A timeout was performed prior to the initiation of the procedure. A planning axial CT scan was performed. The lesion in the left aspect of the superior sacrum was localized. A suitable skin entry site was selected and marked. The skin was sterilely prepped and draped in the standard fashion using chlorhexidine skin prep. Local anesthesia was attained by infiltration with 1% lidocaine. A small dermatotomy was made. Under intermittent CT guidance, a 17 gauge introducer needle was advanced  and positioned at the margin of the mass. Multiple 18 gauge core biopsies were then obtained coaxially. Biopsy specimens were placed in both formalin and saline and delivered to pathology for further analysis. Biopsy device and introducer needle were removed. Hemostasis was attained by manual pressure. IMPRESSION: Technically successful CT-guided biopsy of sacral soft tissue mass. Electronically Signed   By: Jacqulynn Cadet M.D.   On: 03/27/2019 09:58    All questions  were answered. The patient knows to call the clinic with any problems, questions or concerns. No barriers to learning was detected.  I spent 55 minutes counseling the patient face to face. The total time spent in the appointment was 60 minutes and more than 50% was on counseling and review of test results  Heath Lark, MD 04/12/2019 1:48 PM

## 2019-04-12 NOTE — Progress Notes (Signed)
START ON PATHWAY REGIMEN - Multiple Myeloma and Other Plasma Cell Dyscrasias     A cycle is every 21 days:     Bortezomib      Lenalidomide      Dexamethasone   **Always confirm dose/schedule in your pharmacy ordering system**  Patient Characteristics: Newly Diagnosed, Transplant Eligible, Unknown or Awaiting Test Results R-ISS Staging: I Disease Classification: Newly Diagnosed Is Patient Eligible for Transplant<= Transplant Eligible Risk Status: Awaiting Test Results Intent of Therapy: Non-Curative / Palliative Intent, Discussed with Patient

## 2019-04-13 ENCOUNTER — Other Ambulatory Visit: Payer: Medicare Other

## 2019-04-13 ENCOUNTER — Telehealth: Payer: Self-pay | Admitting: Hematology and Oncology

## 2019-04-13 ENCOUNTER — Telehealth: Payer: Self-pay

## 2019-04-13 ENCOUNTER — Other Ambulatory Visit: Payer: Self-pay | Admitting: Student

## 2019-04-13 MED ORDER — LENALIDOMIDE 15 MG PO CAPS: 15.0000 mg | ORAL_CAPSULE | Freq: Every day | ORAL | 0 refills | Status: DC

## 2019-04-13 NOTE — Telephone Encounter (Signed)
Oral Oncology Patient Advocate Encounter  Was successful in securing patient a $11000 grant from [facility] to provide copayment coverage for Revlimid. This will keep the out of pocket expense at $0.   I have spoken with the patient..   The billing information is as follows and has been shared with [place].   Member ID: [id number] Group ID: [id number] RxBin: [id number] Dates of Eligibility: 03/14/19 through 03/12/20  [place] Patient [facility] Phone [phone] Fax [phone] 04/13/2019    11:18 AM

## 2019-04-13 NOTE — Telephone Encounter (Signed)
I talk with patient regarding schedule  

## 2019-04-13 NOTE — Telephone Encounter (Signed)
Oral Chemotherapy Pharmacist Encounter   I spoke with patient for overview of: Revlimid (lenalidomide) for the treatment of multiple myeloma not having achieved remission in conjunction with bortezomib and dexamethasone, planned duration 4-6 cycles of induction therapy and the reassessment.   Counseled patient on administration, dosing, side effects, monitoring, drug-food interactions, safe handling, storage, and disposal.  Patient will take Revlimid 69m capsules, 1 capsule by mouth once daily, without regard to food, with a full glass of water.  Revlimid will be given 14 days on, 7 days off, repeat every 21 days.  Per MD note, patient will continue taking dexamethasone 457mtablets once daily and will be provided with a prescription for pulse, weekly dexamethasone at next office visit.  Revlimid start date: 04/17/2019  Adverse effects of Revlimid include but are not limited to: nausea, constipation, diarrhea, abdominal pain, rash, fatigue, drug fever, and decreased blood counts.    Reviewed with patient importance of keeping a medication schedule and plan for any missed doses.  Medication reconciliation performed and medication/allergy list updated.  Patient has already picked up acyclovir and aspirin 8139mPatient counseled on importance of both of these supportive therapy medications.  Insurance authorization for Revlimid has been obtained.  Revlimid prescription is being dispensed from [facility] it is a limited distribution medication.  Prescription was e-scribed to [facility] this morning. Supporting information including demographics, insurance information and authorization, medication list, and copayment grant information has been faxed to dispensing pharmacy.  Medication calendar and appointment calendar will be mailed to patient today.  All questions answered.  Mr. RoaMozingoiced understanding and appreciation.   Patient knows to call the office with  questions or concerns.  JesJohny DrillingharmD, BCPS, BCOP  04/13/2019    10:49 AM Oral Oncology Clinic [phone]

## 2019-04-14 ENCOUNTER — Encounter (HOSPITAL_COMMUNITY): Payer: Self-pay

## 2019-04-14 ENCOUNTER — Ambulatory Visit (HOSPITAL_COMMUNITY)
Admission: RE | Admit: 2019-04-14 | Discharge: 2019-04-14 | Disposition: A | Discharge status: Home / Self Care | Payer: Medicare Other | Source: Ambulatory Visit | Attending: Hematology and Oncology | Admitting: Hematology and Oncology

## 2019-04-14 ENCOUNTER — Other Ambulatory Visit: Payer: Self-pay

## 2019-04-14 DIAGNOSIS — Z79811 Long term (current) use of aromatase inhibitors: Secondary | ICD-10-CM | POA: Diagnosis not present

## 2019-04-14 DIAGNOSIS — Z79899 Other long term (current) drug therapy: Secondary | ICD-10-CM | POA: Diagnosis not present

## 2019-04-14 DIAGNOSIS — I10 Essential (primary) hypertension: Secondary | ICD-10-CM | POA: Diagnosis not present

## 2019-04-14 DIAGNOSIS — C9000 Multiple myeloma not having achieved remission: Secondary | ICD-10-CM | POA: Diagnosis not present

## 2019-04-14 DIAGNOSIS — D7589 Other specified diseases of blood and blood-forming organs: Secondary | ICD-10-CM | POA: Diagnosis not present

## 2019-04-14 DIAGNOSIS — D72829 Elevated white blood cell count, unspecified: Secondary | ICD-10-CM | POA: Diagnosis not present

## 2019-04-14 LAB — PROTIME-INR
INR: 1 (ref 0.8–1.2)
Prothrombin Time: 13 seconds (ref 11.4–15.2)

## 2019-04-14 LAB — CBC
HCT: 48.8 % (ref 39.0–52.0)
Hemoglobin: 16.6 g/dL (ref 13.0–17.0)
MCH: 34.3 pg — ABNORMAL HIGH (ref 26.0–34.0)
MCHC: 34 g/dL (ref 30.0–36.0)
MCV: 100.8 fL — ABNORMAL HIGH (ref 80.0–100.0)
Platelets: 317 10*3/uL (ref 150–400)
RBC: 4.84 MIL/uL (ref 4.22–5.81)
RDW: 12.8 % (ref 11.5–15.5)
WBC: 10.8 10*3/uL — ABNORMAL HIGH (ref 4.0–10.5)
nRBC: 0 % (ref 0.0–0.2)

## 2019-04-14 LAB — APTT: aPTT: 26 seconds (ref 24–36)

## 2019-04-14 MED ORDER — LIDOCAINE HCL (PF) 1 % IJ SOLN
INTRAMUSCULAR | Status: AC | PRN
  Administered 2019-04-14: 10 mL

## 2019-04-14 MED ORDER — FENTANYL CITRATE (PF) 100 MCG/2ML IJ SOLN
INTRAMUSCULAR | Status: AC | PRN
  Administered 2019-04-14: 50 ug via INTRAVENOUS

## 2019-04-14 MED ORDER — MIDAZOLAM HCL 2 MG/2ML IJ SOLN
INTRAMUSCULAR | Status: AC | PRN
  Administered 2019-04-14 (×2): 1 mg via INTRAVENOUS

## 2019-04-14 MED ORDER — SODIUM CHLORIDE 0.9 % IV SOLN
INTRAVENOUS | Status: DC
  Administered 2019-04-14: 08:00:00 via INTRAVENOUS

## 2019-04-14 MED ORDER — MIDAZOLAM HCL 2 MG/2ML IJ SOLN
INTRAMUSCULAR | Status: AC
  Filled 2019-04-14: qty 4

## 2019-04-14 MED ORDER — HYDROCODONE-ACETAMINOPHEN 5-325 MG PO TABS: 1.0000 | ORAL_TABLET | ORAL | Status: DC | PRN

## 2019-04-14 MED ORDER — FENTANYL CITRATE (PF) 100 MCG/2ML IJ SOLN
INTRAMUSCULAR | Status: AC
  Filled 2019-04-14: qty 2

## 2019-04-14 NOTE — Discharge Instructions (Signed)
Moderate Conscious Sedation, Adult, Care After These instructions provide you with information about caring for yourself after your procedure. Your health care provider may also give you more specific instructions. Your treatment has been planned according to current medical practices, but problems sometimes occur. Call your health care provider if you have any problems or questions after your procedure. What can I expect after the procedure? After your procedure, it is common:  To feel sleepy for several hours.  To feel clumsy and have poor balance for several hours.  To have poor judgment for several hours.  To vomit if you eat too soon. Follow these instructions at home: For at least 24 hours after the procedure:   Do not: ? Participate in activities where you could fall or become injured. ? Drive. ? Use heavy machinery. ? Drink alcohol. ? Take sleeping pills or medicines that cause drowsiness. ? Make important decisions or sign legal documents. ? Take care of children on your own.  Rest. Eating and drinking  Follow the diet recommended by your health care provider.  If you vomit: ? Drink water, juice, or soup when you can drink without vomiting. ? Make sure you have little or no nausea before eating solid foods. General instructions  Have a responsible adult stay with you until you are awake and alert.  Take over-the-counter and prescription medicines only as told by your health care provider.  If you smoke, do not smoke without supervision.  Keep all follow-up visits as told by your health care provider. This is important. Contact a health care provider if:  You keep feeling nauseous or you keep vomiting.  You feel light-headed.  You develop a rash.  You have a fever. Get help right away if:  You have trouble breathing. This information is not intended to replace advice given to you by your health care provider. Make sure you discuss any questions you have  with your health care provider. Document Released: 07/12/2013 Document Revised: 09/03/2017 Document Reviewed: 01/11/2016 Elsevier Patient Education  2020 [employer].   Bone Marrow Aspiration and Bone Marrow Biopsy, Adult, Care After This sheet gives you information about how to care for yourself after your procedure. Your health care provider may also give you more specific instructions. If you have problems or questions, contact your health care provider. What can I expect after the procedure? After the procedure, it is common to have:  Mild pain and tenderness.  Swelling.  Bruising. Follow these instructions at home: Puncture site care      Follow instructions from your health care provider about how to take care of the puncture site. Make sure you: ? Wash your hands with soap and water before you change your bandage (dressing). If soap and water are not available, use hand sanitizer. ? Change your dressing as told by your health care provider.  Check your puncture siteevery day for signs of infection. Check for: ? More redness, swelling, or pain. ? More fluid or blood. ? Warmth. ? Pus or a bad smell. General instructions  Take over-the-counter and prescription medicines only as told by your health care provider.  Do not take baths, swim, or use a hot tub until your health care provider approves. Ask if you can take a shower or have a sponge bath.  Return to your normal activities as told by your health care provider. Ask your health care provider what activities are safe for you.  Do not drive for 24 hours if you were  given a medicine to help you relax (sedative) during your procedure. °· Keep all follow-up visits as told by your health care provider. This is important. °Contact a health care provider if: °· Your pain is not controlled with medicine. °Get help right away if: °· You have a fever. °· You have more redness, swelling, or pain around the puncture site. °· You  have more fluid or blood coming from the puncture site. °· Your puncture site feels warm to the touch. °· You have pus or a bad smell coming from the puncture site. °These symptoms may represent a serious problem that is an emergency. Do not wait to see if the symptoms will go away. Get medical help right away. Call your local emergency services (911 in the [place] Do not drive yourself to the hospital. °Summary °· After the procedure, it is common to have mild pain, tenderness, swelling, and bruising. °· Follow instructions from your health care provider about how to take care of the puncture site. °· Get help right away if you have any symptoms of infection or if you have more blood or fluid coming from the puncture site. °This information is not intended to replace advice given to you by your health care provider. Make sure you discuss any questions you have with your health care provider. °Document Released: 04/10/2005 Document Revised: 01/04/2018 Document Reviewed: 03/04/2016 °Elsevier Patient Education © 2020 [employer]. ° ° °

## 2019-04-14 NOTE — H&P (Addendum)
Referring Physician(s): Heath Lark  Supervising Physician: Jacqulynn Cadet  Patient Status:  Jesse Mcclure  Chief Complaint: "I'm having a bone marrow biopsy"   Subjective: Patient familiar to IR service from sacral soft tissue mass biopsy on 03/27/2019.  He has a history of newly diagnosed multiple myeloma and presents today for CT-guided bone marrow biopsy for staging purposes.  He denies fever, headache, chest pain, dyspnea, cough, abdominal pain, nausea, vomiting or bleeding.  He does have some low back pain.  Past Medical History:  Diagnosis Date   BPH (benign prostatic hypertrophy)    Degenerative disc disease    Diverticulosis    Hemochromatosis    HTN (hypertension)    Hyperlipidemia    Polio    Status post Nissen fundoplication (without gastrostomy tube) procedure    for hiatal hernia.   Past Surgical History:  Procedure Laterality Date   FOOT SURGERY     HIATAL HERNIA REPAIR     OTHER SURGICAL HISTORY     Cataract Surgery--Both eyes   Undescended testes Right 1968      Allergies: Patient has no known allergies.  Medications: Prior to Admission medications   Medication Sig Start Date End Date Taking? Authorizing Provider  acetaminophen (TYLENOL) 500 MG tablet Take 500 mg by mouth every 6 (six) hours as needed.   Yes [provider]  HYDROcodone-acetaminophen (NORCO) 5-325 MG tablet Take 1 tablet by mouth every 6 (six) hours as needed for moderate pain. 04/12/19  Yes Gorsuch, Ernst Spell, MD  lisinopril-hydrochlorothiazide (PRINZIDE,ZESTORETIC) 20-12.5 MG tablet TAKE 1 TABLET BY MOUTH ONCE DAILY 09/22/18  Yes Denita Lung, MD  acyclovir (ZOVIRAX) 400 MG tablet Take 1 tablet (400 mg total) by mouth 2 (two) times daily. 04/12/19   Heath Lark, MD  dexamethasone (DECADRON) 4 MG tablet Take 1 tablet (4 mg total) by mouth daily. 04/04/19   Heath Lark, MD  diclofenac sodium (VOLTAREN) 1 % GEL Apply 2 g topically 4 (four) times daily as needed.    [provider]  lenalidomide (REVLIMID) 15 MG capsule Take 1 capsule (15 mg total) by mouth daily. Take for 14 days on, 7 days off, repeat every 21 days. [employer] auth# [id number] 04/13/19   Heath Lark, MD  ondansetron (ZOFRAN) 8 MG tablet Take 1 tablet (8 mg total) by mouth 2 (two) times daily as needed (Nausea or vomiting). 04/12/19   Heath Lark, MD  prochlorperazine (COMPAZINE) 10 MG tablet Take 1 tablet (10 mg total) by mouth every 6 (six) hours as needed (Nausea or vomiting). 04/12/19   Heath Lark, MD     Vital Signs: BP (!) 157/75    Pulse 66    Temp 98.2 F (36.8 C) (Oral)    Resp 16    Ht '5\' 8"'  (1.727 m)    Wt 227 lb (103 kg)    SpO2 100%    BMI 34.52 kg/m   Physical Exam awake, alert.  Chest clear to auscultation bilaterally.  Heart with regular rate and rhythm.  Abdomen soft, positive bowel sounds, nontender.  No lower extremity edema.  Imaging: Nm Pet Image Initial (pi) Whole Body  Result Date: 04/11/2019 CLINICAL DATA:  Initial treatment strategy for multiple myeloma. EXAM: NUCLEAR MEDICINE PET WHOLE BODY TECHNIQUE: 11.3 mCi F-18 FDG was injected intravenously. Full-ring PET imaging was performed from the skull base to thigh after the radiotracer. CT data was obtained and used for attenuation correction and anatomic localization. Fasting blood glucose: 109 mg/dl COMPARISON:  MRI 03/20/2019 FINDINGS:  Mediastinal blood pool activity: SUV max 3.04 HEAD/NECK: No hypermetabolic activity in the scalp. No hypermetabolic cervical lymph nodes. Incidental CT findings: none CHEST: No hypermetabolic mediastinal or hilar nodes. No suspicious pulmonary nodules on the CT scan. Incidental CT findings: Aortic atherosclerosis. Calcification in the LAD coronary artery noted. ABDOMEN/PELVIS: No abnormal hypermetabolic activity within the liver, pancreas, adrenal glands, or spleen. No hypermetabolic lymph nodes in the abdomen or pelvis. Asymmetric left bladder wall thickening is identified which measures 0.8 cm in  thickness. Prostate gland enlargement noted. Aortic atherosclerosis. Incidental CT findings: none SKELETON: There is diffuse heterogeneous radiotracer activity within the bone marrow. Expansile mass involving the left side of the L5 vertebra is again noted. This measures approximately 5.8 cm and has an SUV max of 12.2. Second lesion involving S1 and S2, left of the midline measures 5.2 cm and has an SUV max of 11.8. Small lucent lesion is identified within the left side of the T3 vertebra measuring 8 mm. No significant FDG uptake identified within this lesion. Incidental CT findings: none EXTREMITIES: Within the intertrochanteric portions of the proximal right femur there is a small focus of increased uptake within SUV max of 5.31. No corresponding lytic or sclerotic bone lesion identified on the CT images. Incidental CT findings: none IMPRESSION: 1. Previously noted expansile lesions involving the L5 vertebra and sacrum are again noted and exhibit intense FDG uptake compatible with metabolically active disease. A third focus of increased uptake without corresponding CT abnormality is noted within the intertrochanteric portions of the proximal right femur. 2. Small lucent lesion within the T3 vertebra is noted without corresponding FDG uptake. 3. Asymmetric left bladder wall thickening, etiology indeterminate. 4. Aortic Atherosclerosis (ICD10-I70.0). Lad coronary artery calcification. Electronically Signed   By: Kerby Moors M.D.   On: 04/11/2019 11:47    Labs:  CBC: Recent Labs    11/21/18 0803 03/27/19 0738 04/06/19 0858 04/14/19 0755  WBC 4.1 6.0 7.7 10.8*  HGB 14.6 14.8 14.8 16.6  HCT 43.8 44.4 43.0 48.8  PLT 208 267 272 317    COAGS: Recent Labs    03/27/19 0738 04/14/19 0755  INR 1.0 1.0  APTT  --  26    BMP: Recent Labs    10/11/18 1115 03/27/19 0738 04/06/19 0858  NA 139 134* 133*  K 4.7 3.9 3.7  CL 100 99 102  CO2 '23 24 22  ' GLUCOSE 102* 110* 122*  BUN '23 19 21    ' CALCIUM 9.4 9.5 9.1  CREATININE 0.93 0.75 0.84  GFRNONAA 85 >60 >60  GFRAA 98 >60 >60    LIVER FUNCTION TESTS: Recent Labs    10/11/18 1115 04/06/19 0858  BILITOT 1.2 0.7  AST 30 25  ALT 43 29  ALKPHOS 73 56  PROT 7.2 8.0  ALBUMIN 4.7 3.8    Assessment and Plan: Pt with history of newly diagnosed multiple myeloma ; presents today for CT-guided bone marrow biopsy for staging purposes.Risks and benefits of procedure was discussed with the patient  including, but not limited to bleeding, infection, damage to adjacent structures or low yield requiring additional tests.  All of the questions were answered and there is agreement to proceed.  Consent signed and in chart.     Electronically Signed: D. Rowe Robert, PA-C 04/14/2019, 8:17 AM   I spent a total of 20 minutes at the the patient's bedside AND on the patient's hospital floor or unit, greater than of which was counseling/coordinating care for CT-guided bone marrow biopsy

## 2019-04-14 NOTE — Procedures (Signed)
Interventional Radiology Procedure Note  Procedure: CT guided aspirate and core biopsy of right iliac bone Complications: None Recommendations: - Bedrest supine x 1 hrs - Hydrocodone PRN  Pain - Follow biopsy results  Signed,  Adelee Hannula K. Eshan Trupiano, MD   

## 2019-04-14 NOTE — Telephone Encounter (Signed)
Oral Oncology Patient Advocate Encounter  I followed up with [facility] on the Revlimid prescription. They have everything they need, the copay is $0. [facility] has attempted to reach the patient twice to schedule shipment with no success.   I called the patient and left a detailed voicemail with [employer] phone number to call [phone].  [facility] [facility] [place] Phone [phone] Fax [phone] 04/14/2019   3:01 PM

## 2019-04-17 ENCOUNTER — Inpatient Hospital Stay: Payer: Medicare Other

## 2019-04-17 ENCOUNTER — Inpatient Hospital Stay (HOSPITAL_BASED_OUTPATIENT_CLINIC_OR_DEPARTMENT_OTHER): Payer: Medicare Other | Admitting: Hematology and Oncology

## 2019-04-17 ENCOUNTER — Other Ambulatory Visit: Payer: Self-pay

## 2019-04-17 ENCOUNTER — Encounter: Payer: Self-pay | Admitting: Hematology and Oncology

## 2019-04-17 DIAGNOSIS — C9000 Multiple myeloma not having achieved remission: Secondary | ICD-10-CM

## 2019-04-17 DIAGNOSIS — Z7982 Long term (current) use of aspirin: Secondary | ICD-10-CM | POA: Diagnosis not present

## 2019-04-17 DIAGNOSIS — Z79899 Other long term (current) drug therapy: Secondary | ICD-10-CM | POA: Diagnosis not present

## 2019-04-17 DIAGNOSIS — K5909 Other constipation: Secondary | ICD-10-CM

## 2019-04-17 DIAGNOSIS — D72829 Elevated white blood cell count, unspecified: Secondary | ICD-10-CM | POA: Diagnosis not present

## 2019-04-17 DIAGNOSIS — G893 Neoplasm related pain (acute) (chronic): Secondary | ICD-10-CM | POA: Diagnosis not present

## 2019-04-17 DIAGNOSIS — I700 Atherosclerosis of aorta: Secondary | ICD-10-CM | POA: Diagnosis not present

## 2019-04-17 DIAGNOSIS — K5900 Constipation, unspecified: Secondary | ICD-10-CM

## 2019-04-17 DIAGNOSIS — R200 Anesthesia of skin: Secondary | ICD-10-CM

## 2019-04-17 DIAGNOSIS — M545 Low back pain: Secondary | ICD-10-CM

## 2019-04-17 LAB — CBC WITH DIFFERENTIAL (CANCER CENTER ONLY)
Abs Immature Granulocytes: 0.06 10*3/uL (ref 0.00–0.07)
Basophils Absolute: 0 10*3/uL (ref 0.0–0.1)
Basophils Relative: 0 %
Eosinophils Absolute: 0 10*3/uL (ref 0.0–0.5)
Eosinophils Relative: 0 %
HCT: 45.3 % (ref 39.0–52.0)
Hemoglobin: 15.5 g/dL (ref 13.0–17.0)
Immature Granulocytes: 1 %
Lymphocytes Relative: 7 %
Lymphs Abs: 0.7 10*3/uL (ref 0.7–4.0)
MCH: 33.5 pg (ref 26.0–34.0)
MCHC: 34.2 g/dL (ref 30.0–36.0)
MCV: 97.8 fL (ref 80.0–100.0)
Monocytes Absolute: 0.3 10*3/uL (ref 0.1–1.0)
Monocytes Relative: 3 %
Neutro Abs: 8.9 10*3/uL — ABNORMAL HIGH (ref 1.7–7.7)
Neutrophils Relative %: 89 %
Platelet Count: 309 10*3/uL (ref 150–400)
RBC: 4.63 MIL/uL (ref 4.22–5.81)
RDW: 12.5 % (ref 11.5–15.5)
WBC Count: 10 10*3/uL (ref 4.0–10.5)
nRBC: 0 % (ref 0.0–0.2)

## 2019-04-17 LAB — CMP (CANCER CENTER ONLY)
ALT: 60 U/L — ABNORMAL HIGH (ref 0–44)
AST: 22 U/L (ref 15–41)
Albumin: 3.5 g/dL (ref 3.5–5.0)
Alkaline Phosphatase: 59 U/L (ref 38–126)
Anion gap: 9 (ref 5–15)
BUN: 18 mg/dL (ref 8–23)
CO2: 24 mmol/L (ref 22–32)
Calcium: 9.4 mg/dL (ref 8.9–10.3)
Chloride: 99 mmol/L (ref 98–111)
Creatinine: 0.8 mg/dL (ref 0.61–1.24)
GFR, Est AFR Am: >60 mL/min (ref >60)
GFR, Estimated: >60 mL/min (ref >60)
Glucose, Bld: 124 mg/dL — ABNORMAL HIGH (ref 70–99)
Potassium: 4.3 mmol/L (ref 3.5–5.1)
Sodium: 132 mmol/L — ABNORMAL LOW (ref 135–145)
Total Bilirubin: 0.6 mg/dL (ref 0.3–1.2)
Total Protein: 7.9 g/dL (ref 6.5–8.1)

## 2019-04-17 LAB — FERRITIN: Ferritin: 224 ng/mL (ref 24–336)

## 2019-04-17 MED ORDER — PROCHLORPERAZINE MALEATE 10 MG PO TABS
ORAL_TABLET | ORAL | Status: AC
  Filled 2019-04-17: qty 1

## 2019-04-17 MED ORDER — DEXAMETHASONE 4 MG PO TABS: ORAL_TABLET | ORAL | 0 refills | Status: DC

## 2019-04-17 MED ORDER — PROCHLORPERAZINE MALEATE 10 MG PO TABS
10.0000 mg | ORAL_TABLET | Freq: Once | ORAL | Status: AC
  Administered 2019-04-17: 10 mg via ORAL

## 2019-04-17 MED ORDER — BORTEZOMIB CHEMO SQ INJECTION 3.5 MG (2.5MG/ML)
1.3000 mg/m2 | Freq: Once | INTRAMUSCULAR | Status: AC
  Administered 2019-04-17: 3 mg via SUBCUTANEOUS
  Filled 2019-04-17: qty 1.2

## 2019-04-17 NOTE — Progress Notes (Signed)
[place] OFFICE PROGRESS NOTE  Patient Care Team: Denita Lung, MD as PCP - General (Family Medicine) Heath Lark, MD as Consulting Physician (Hematology and Oncology)  ASSESSMENT & PLAN:  Multiple myeloma without remission [facility]) Results of bone marrow biopsy is pending The patient is a transplant candidate I recommend combination chemotherapy with Velcade, lenalidomide and dexamethasone I plan to change his dexamethasone to weekly He will get Velcade on days 1, 4, 7 and 11 for cycle of every 21 days He will also be prescribed Revlimid at 15 mg daily for days 1-14, rest 7 days for cycle of every 21 days I recommend aspirin therapy I recommend acyclovir for antimicrobial prophylaxis I recommend calcium with vitamin D He has appointment to see his dentist next week for dental clearance before I prescribe Zometa Due to lack of progression of symptoms, I do not recommend radiation therapy right now but rather to focus on palliative chemotherapy He understood that while multiple myeloma is highly treatable, at this point in time, it is not considered curable I will schedule appointment to see bone marrow transplant team when his final bone marrow results are available The risks, benefits, side effects of chemotherapy treatment were discussed and he agreed to proceed with the plan of care   Cancer associated pain His pain control is excellent He will continue dexamethasone and pain medicine as needed  Other constipation He has mild persistent constipation from his narcotic prescription We discussed laxative therapy   No orders of the defined types were placed in this encounter.   INTERVAL HISTORY: Please see below for problem oriented charting. He returns for further follow-up His back pain is stable No new neurological deficit He is able to regulate his bowel movement with laxative He has seen his dentist today, further recommendation is pending  SUMMARY OF  ONCOLOGIC HISTORY: Oncology History  Multiple myeloma without remission ([place])  03/20/2019 Imaging   1. Tumor involving the L5 and S1 and S2 segments of the spine as described with mass effects upon the left L4, L5, S1 and more distal left sacral nerves as described above. 2. Does the patient have a history of malignancy? 3. Multiple plasmacytomas and metastatic disease could give this appearance   03/27/2019 Pathology Results   Soft Tissue Needle Core Biopsy, sacral mass - PLASMABLASTIC NEOPLASM. - SEE COMMENT. Microscopic Comment The sections show needle core biopsy fragments of soft tissue densely infiltrated by a relatively monomorphic infiltrate of atypical plasmacytoid cells characterized by vesicular or partially clumped chromatin and prominent nucleoli. This is associated with scattered mitosis. A battery of immunohistochemical stains was performed and show that the atypical plasmacytoid cells are positive for CD138, CD43 and cytoplasmic kappa. There is also weak positivity for CD56 and partial variable positivity for cyclin D1. The atypical plasmacytoid cells are negative for cytoplasmic lambda, CD10, PAX5, CD79a, CD20, CD3, CD5, CD34, EBV (ISH) and mostly negative for LCA. The overall morphologic and histologic features are most compatible with a plasmablastic neoplasm. The differential diagnosis includes plasmablastic lymphoma and plasmablastic plasmacytoma/myeloma. Based on the overall phenotypic features and the clinical setting, plasmacytoma/myeloma is favored. Clinical correlation and hematologic evaluation is recommended   03/27/2019 Procedure   Technically successful CT-guided biopsy of sacral soft tissue mass.   04/04/2019 Initial Diagnosis   Multiple myeloma without remission ([place])   04/11/2019 PET scan   IMPRESSION: 1. Previously noted expansile lesions involving the L5 vertebra and sacrum are again noted and exhibit intense FDG uptake compatible with  metabolically active disease.  A third focus of increased uptake without corresponding CT abnormality is noted within the intertrochanteric portions of the proximal right femur. 2. Small lucent lesion within the T3 vertebra is noted without corresponding FDG uptake. 3. Asymmetric left bladder wall thickening, etiology indeterminate. 4. Aortic Atherosclerosis (ICD10-I70.0). Lad coronary artery calcification.   04/12/2019 Cancer Staging   Staging form: Plasma Cell Myeloma and Plasma Cell Disorders, AJCC 8th Edition - Clinical stage from 04/12/2019: Beta-2-microglobulin (mg/L): 2, Albumin (g/dL): 3.6, ISS: Stage I, High-risk cytogenetics: Unknown, LDH: Unknown - Signed by Heath Lark, MD on 04/12/2019   04/17/2019 -  Chemotherapy   The patient had bortezomib SQ (VELCADE) chemo injection 3 mg, 1.3 mg/m2 = 3 mg, Subcutaneous,  Once, 1 of 4 cycles  for chemotherapy treatment.      REVIEW OF SYSTEMS:   Constitutional: Denies fevers, chills or abnormal weight loss Eyes: Denies blurriness of vision Ears, nose, mouth, throat, and face: Denies mucositis or sore throat Respiratory: Denies cough, dyspnea or wheezes Cardiovascular: Denies palpitation, chest discomfort or lower extremity swelling Gastrointestinal:  Denies nausea, heartburn or change in bowel habits Skin: Denies abnormal skin rashes Lymphatics: Denies new lymphadenopathy or easy bruising Neurological:Denies numbness, tingling or new weaknesses Behavioral/Psych: Mood is stable, no new changes  All other systems were reviewed with the patient and are negative.  I have reviewed the past medical history, past surgical history, social history and family history with the patient and they are unchanged from previous note.  ALLERGIES:  has No Known Allergies.  MEDICATIONS:  Current Outpatient Medications  Medication Sig Dispense Refill  . aspirin EC 81 MG tablet Take 81 mg by mouth daily.    . calcium carbonate (TUMS - DOSED IN MG ELEMENTAL CALCIUM) 500 MG chewable tablet  Chew 1 tablet by mouth 2 (two) times daily.    . cholecalciferol (VITAMIN D3) 25 MCG (1000 UT) tablet Take 2,000 Units by mouth daily.    Marland Kitchen acetaminophen (TYLENOL) 500 MG tablet Take 500 mg by mouth every 6 (six) hours as needed.    Marland Kitchen acyclovir (ZOVIRAX) 400 MG tablet Take 1 tablet (400 mg total) by mouth 2 (two) times daily. 60 tablet 3  . dexamethasone (DECADRON) 4 MG tablet 5 tabs weekly by mouth 30 tablet 0  . diclofenac sodium (VOLTAREN) 1 % GEL Apply 2 g topically 4 (four) times daily as needed.    Marland Kitchen HYDROcodone-acetaminophen (NORCO) 5-325 MG tablet Take 1 tablet by mouth every 6 (six) hours as needed for moderate pain. 60 tablet 0  . lenalidomide (REVLIMID) 15 MG capsule Take 1 capsule (15 mg total) by mouth daily. Take for 14 days on, 7 days off, repeat every 21 days. [employer] auth# [id number] 14 capsule 0  . lisinopril-hydrochlorothiazide (PRINZIDE,ZESTORETIC) 20-12.5 MG tablet TAKE 1 TABLET BY MOUTH ONCE DAILY 90 tablet 3  . ondansetron (ZOFRAN) 8 MG tablet Take 1 tablet (8 mg total) by mouth 2 (two) times daily as needed (Nausea or vomiting). 30 tablet 1  . prochlorperazine (COMPAZINE) 10 MG tablet Take 1 tablet (10 mg total) by mouth every 6 (six) hours as needed (Nausea or vomiting). 30 tablet 1   No current facility-administered medications for this visit.    Facility-Administered Medications Ordered in Other Visits  Medication Dose Route Frequency Provider Last Rate Last Dose  . bortezomib SQ (VELCADE) chemo injection 3 mg  1.3 mg/m2 (Treatment Plan Recorded) Subcutaneous Once Heath Lark, MD        PHYSICAL EXAMINATION: ECOG PERFORMANCE  STATUS: 1 - Symptomatic but completely ambulatory  Vitals:   04/17/19 1248  BP: (!) 156/87  Pulse: 75  Resp: 18  Temp: 99.1 F (37.3 C)  SpO2: 98%   Filed Weights   04/17/19 1248  Weight: 221 lb (100.2 kg)    GENERAL:alert, no distress and comfortable Musculoskeletal:no cyanosis of digits and no clubbing  NEURO: alert & oriented x 3  with fluent speech, no focal motor/sensory deficits  LABORATORY DATA:  I have reviewed the data as listed    Component Value Date/Time   NA 132 (L) 04/17/2019 1228   NA 139 10/11/2018 1115   NA 139 04/05/2014 0849   K 4.3 04/17/2019 1228   K 4.4 04/05/2014 0849   CL 99 04/17/2019 1228   CO2 24 04/17/2019 1228   CO2 23 04/05/2014 0849   GLUCOSE 124 (H) 04/17/2019 1228   GLUCOSE 109 04/05/2014 0849   BUN 18 04/17/2019 1228   BUN 23 10/11/2018 1115   BUN 16.8 04/05/2014 0849   CREATININE 0.80 04/17/2019 1228   CREATININE 0.91 07/29/2017 1004   CREATININE 0.9 04/05/2014 0849   CALCIUM 9.4 04/17/2019 1228   CALCIUM 8.7 04/05/2014 0849   PROT 7.9 04/17/2019 1228   PROT 7.2 10/11/2018 1115   PROT 6.7 04/05/2014 0849   ALBUMIN 3.5 04/17/2019 1228   ALBUMIN 4.7 10/11/2018 1115   ALBUMIN 3.8 04/05/2014 0849   AST 22 04/17/2019 1228   AST 26 04/05/2014 0849   ALT 60 (H) 04/17/2019 1228   ALT 30 04/05/2014 0849   ALKPHOS 59 04/17/2019 1228   ALKPHOS 75 04/05/2014 0849   BILITOT 0.6 04/17/2019 1228   BILITOT 0.71 04/05/2014 0849   GFRNONAA >60 04/17/2019 1228   GFRAA >60 04/17/2019 1228    No results found for: SPEP, UPEP  Lab Results  Component Value Date   WBC 10.0 04/17/2019   NEUTROABS 8.9 (H) 04/17/2019   HGB 15.5 04/17/2019   HCT 45.3 04/17/2019   MCV 97.8 04/17/2019   PLT 309 04/17/2019      Chemistry      Component Value Date/Time   NA 132 (L) 04/17/2019 1228   NA 139 10/11/2018 1115   NA 139 04/05/2014 0849   K 4.3 04/17/2019 1228   K 4.4 04/05/2014 0849   CL 99 04/17/2019 1228   CO2 24 04/17/2019 1228   CO2 23 04/05/2014 0849   BUN 18 04/17/2019 1228   BUN 23 10/11/2018 1115   BUN 16.8 04/05/2014 0849   CREATININE 0.80 04/17/2019 1228   CREATININE 0.91 07/29/2017 1004   CREATININE 0.9 04/05/2014 0849   GLU 10 04/10/2014      Component Value Date/Time   CALCIUM 9.4 04/17/2019 1228   CALCIUM 8.7 04/05/2014 0849   ALKPHOS 59 04/17/2019 1228    ALKPHOS 75 04/05/2014 0849   AST 22 04/17/2019 1228   AST 26 04/05/2014 0849   ALT 60 (H) 04/17/2019 1228   ALT 30 04/05/2014 0849   BILITOT 0.6 04/17/2019 1228   BILITOT 0.71 04/05/2014 0849       RADIOGRAPHIC STUDIES: I have personally reviewed the radiological images as listed and agreed with the findings in the report. Mr Lumbar Spine Wo Contrast  Result Date: 03/20/2019 CLINICAL DATA:  Low back pain and left buttock and leg pain since March 2020. EXAM: MRI LUMBAR SPINE WITHOUT CONTRAST TECHNIQUE: Multiplanar, multisequence MR imaging of the lumbar spine was performed. No intravenous contrast was administered. COMPARISON:  Lumbar radiographs dated 11/24/2018 FINDINGS: Segmentation:  Standard. Alignment:  Minimal retrolisthesis of L2 on L3 and of L3 on L4. Vertebrae: There is an expansile mass involving the left side of the L5 vertebral body destroying the pedicle and inferior and superior facets. This mass measures 6 x 5 x 3.3 cm. The mass extends into the left psoas muscle. There is a 5 x 5.4 x 4.0 cm expansile mass involving the S1 and S2 segments of the sacrum to the left of midline. This mass extends into the sacral spinal canal and deviates the thecal sac to the right at S1-2. Conus medullaris and cauda equina: Conus extends to the L1-2 level. Conus and cauda equina appear normal. Paraspinal and other soft tissues: The masses at L5 and S1 due extending into the adjacent soft tissues. The bladder is distended with an irregular bladder wall and small bladder diverticula. This suggests chronic bladder outlet obstruction. Disc levels: L1-2: Tiny disc bulge to the right of midline with no neural impingement. Disc desiccation. L2-3: Disc desiccation with slight retrolisthesis. Small broad-based disc bulge asymmetric into the left neural foramen and lateral to it with a slight mass effect upon the left L2 nerve lateral to the neural foramen best seen on image 17 of series 10. L3-4: Small broad-based  soft disc protrusion with slight compression of the thecal sac without focal neural impingement. L4-5: Disc space narrowing with disc desiccation. No disc bulging or protrusion. Tumor in the left side of the L5 vertebral body does extend into the left paraspinal soft tissues and surrounds the left L4 nerve best seen on image 31 of series 10. L5-S1: The tumor in the left side of L5 extends into the left lateral recess and left neural foramen compressing the left L5 nerve in the neural foramen. S1-2: The tumor in the left side of S1 on S2 extends into the sacral spinal canal and deviates the thecal sac to the right which has a mass effect upon the left S1 nerve root sleeve and should affect the more distal left sacral nerves. IMPRESSION: 1. Tumor involving the L5 and S1 and S2 segments of the spine as described with mass effects upon the left L4, L5, S1 and more distal left sacral nerves as described above. 2. Does the patient have a history of malignancy? 3. 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 Electronically Signed   By: Lorriane Shire M.D.   On: 03/20/2019 17:51   Nm Pet Image Initial (pi) Whole Body  Result Date: 04/11/2019 CLINICAL DATA:  Initial treatment strategy for multiple myeloma. EXAM: NUCLEAR MEDICINE PET WHOLE BODY TECHNIQUE: 11.3 mCi F-18 FDG was injected intravenously. Full-ring PET imaging was performed from the skull base to thigh after the radiotracer. CT data was obtained and used for attenuation correction and anatomic localization. Fasting blood glucose: 109 mg/dl COMPARISON:  MRI 03/20/2019 FINDINGS: Mediastinal blood pool activity: SUV max 3.04 HEAD/NECK: No hypermetabolic activity in the scalp. No hypermetabolic cervical lymph nodes. Incidental CT findings: none CHEST: No hypermetabolic mediastinal or hilar nodes. No suspicious pulmonary nodules on the CT scan. Incidental CT findings: Aortic atherosclerosis. Calcification in the LAD coronary artery  noted. ABDOMEN/PELVIS: No abnormal hypermetabolic activity within the liver, pancreas, adrenal glands, or spleen. No hypermetabolic lymph nodes in the abdomen or pelvis. Asymmetric left bladder wall thickening is identified which measures 0.8 cm in thickness. Prostate gland enlargement noted. Aortic atherosclerosis. Incidental CT findings: none SKELETON: There is diffuse heterogeneous radiotracer activity within the bone marrow. Expansile mass involving the left side of  the L5 vertebra is again noted. This measures approximately 5.8 cm and has an SUV max of 12.2. Second lesion involving S1 and S2, left of the midline measures 5.2 cm and has an SUV max of 11.8. Small lucent lesion is identified within the left side of the T3 vertebra measuring 8 mm. No significant FDG uptake identified within this lesion. Incidental CT findings: none EXTREMITIES: Within the intertrochanteric portions of the proximal right femur there is a small focus of increased uptake within SUV max of 5.31. No corresponding lytic or sclerotic bone lesion identified on the CT images. Incidental CT findings: none IMPRESSION: 1. Previously noted expansile lesions involving the L5 vertebra and sacrum are again noted and exhibit intense FDG uptake compatible with metabolically active disease. A third focus of increased uptake without corresponding CT abnormality is noted within the intertrochanteric portions of the proximal right femur. 2. Small lucent lesion within the T3 vertebra is noted without corresponding FDG uptake. 3. Asymmetric left bladder wall thickening, etiology indeterminate. 4. Aortic Atherosclerosis (ICD10-I70.0). Lad coronary artery calcification. Electronically Signed   By: Kerby Moors M.D.   On: 04/11/2019 11:47   Ct Biopsy  Result Date: 04/14/2019 INDICATION: 68 year old male with a new diagnosis of multiple myeloma. Bone marrow biopsy is requested to evaluate for marrow involvement. EXAM: CT GUIDED BONE MARROW ASPIRATION  AND CORE BIOPSY Interventional Radiologist:  Criselda Peaches, MD MEDICATIONS: None. ANESTHESIA/SEDATION: Moderate (conscious) sedation was employed during this procedure. A total of 3 milligrams versed and 100 micrograms fentanyl were administered intravenously. The patient's level of consciousness and vital signs were monitored continuously by radiology nursing throughout the procedure under my direct supervision. Total monitored sedation time: 11 minutes FLUOROSCOPY TIME:  None COMPLICATIONS: None immediate. Estimated blood loss: <25 mL PROCEDURE: Informed written consent was obtained from the patient after a thorough discussion of the procedural risks, benefits and alternatives. All questions were addressed. Maximal Sterile Barrier Technique was utilized including caps, mask, sterile gowns, sterile gloves, sterile drape, hand hygiene and skin antiseptic. A timeout was performed prior to the initiation of the procedure. The patient was positioned prone and non-contrast localization CT was performed of the pelvis to demonstrate the iliac marrow spaces. Maximal barrier sterile technique utilized including caps, mask, sterile gowns, sterile gloves, large sterile drape, hand hygiene, and betadine prep. Under sterile conditions and local anesthesia, an 11 gauge coaxial bone biopsy needle was advanced into the right iliac marrow space. Needle position was confirmed with CT imaging. Initially, bone marrow aspiration was performed. Next, the 11 gauge outer cannula was utilized to obtain a right iliac bone marrow core biopsy. Needle was removed. Hemostasis was obtained with compression. The patient tolerated the procedure well. Samples were prepared with the cytotechnologist. IMPRESSION: Technically successful CT-guided bone marrow aspiration and core biopsy of the right iliac bone. Electronically Signed   By: Jacqulynn Cadet M.D.   On: 04/14/2019 09:54   Ct Biopsy  Result Date: 03/27/2019 INDICATION: 68 year old  male with lumbar and sacral lytic osseous lesions concerning for either multiple myeloma or metastatic disease from an uncertain primary. EXAM: CT BIOPSY MEDICATIONS: None. ANESTHESIA/SEDATION: Moderate (conscious) sedation was employed during this procedure. A total of Versed 3 mg and Fentanyl 100 mcg was administered intravenously. Moderate Sedation Time: 11 minutes. The patient's level of consciousness and vital signs were monitored continuously by radiology nursing throughout the procedure under my direct supervision. FLUOROSCOPY TIME:  None COMPLICATIONS: None immediate. PROCEDURE: Informed written consent was obtained from the patient after a  thorough discussion of the procedural risks, benefits and alternatives. All questions were addressed. A timeout was performed prior to the initiation of the procedure. A planning axial CT scan was performed. The lesion in the left aspect of the superior sacrum was localized. A suitable skin entry site was selected and marked. The skin was sterilely prepped and draped in the standard fashion using chlorhexidine skin prep. Local anesthesia was attained by infiltration with 1% lidocaine. A small dermatotomy was made. Under intermittent CT guidance, a 17 gauge introducer needle was advanced and positioned at the margin of the mass. Multiple 18 gauge core biopsies were then obtained coaxially. Biopsy specimens were placed in both formalin and saline and delivered to pathology for further analysis. Biopsy device and introducer needle were removed. Hemostasis was attained by manual pressure. IMPRESSION: Technically successful CT-guided biopsy of sacral soft tissue mass. Electronically Signed   By: Jacqulynn Cadet M.D.   On: 03/27/2019 09:58   Ct Bone Marrow Biopsy & Aspiration  Result Date: 04/14/2019 INDICATION: 68 year old male with a new diagnosis of multiple myeloma. Bone marrow biopsy is requested to evaluate for marrow involvement. EXAM: CT GUIDED BONE MARROW  ASPIRATION AND CORE BIOPSY Interventional Radiologist:  Criselda Peaches, MD MEDICATIONS: None. ANESTHESIA/SEDATION: Moderate (conscious) sedation was employed during this procedure. A total of 3 milligrams versed and 100 micrograms fentanyl were administered intravenously. The patient's level of consciousness and vital signs were monitored continuously by radiology nursing throughout the procedure under my direct supervision. Total monitored sedation time: 11 minutes FLUOROSCOPY TIME:  None COMPLICATIONS: None immediate. Estimated blood loss: <25 mL PROCEDURE: Informed written consent was obtained from the patient after a thorough discussion of the procedural risks, benefits and alternatives. All questions were addressed. Maximal Sterile Barrier Technique was utilized including caps, mask, sterile gowns, sterile gloves, sterile drape, hand hygiene and skin antiseptic. A timeout was performed prior to the initiation of the procedure. The patient was positioned prone and non-contrast localization CT was performed of the pelvis to demonstrate the iliac marrow spaces. Maximal barrier sterile technique utilized including caps, mask, sterile gowns, sterile gloves, large sterile drape, hand hygiene, and betadine prep. Under sterile conditions and local anesthesia, an 11 gauge coaxial bone biopsy needle was advanced into the right iliac marrow space. Needle position was confirmed with CT imaging. Initially, bone marrow aspiration was performed. Next, the 11 gauge outer cannula was utilized to obtain a right iliac bone marrow core biopsy. Needle was removed. Hemostasis was obtained with compression. The patient tolerated the procedure well. Samples were prepared with the cytotechnologist. IMPRESSION: Technically successful CT-guided bone marrow aspiration and core biopsy of the right iliac bone. Electronically Signed   By: Jacqulynn Cadet M.D.   On: 04/14/2019 09:54    All questions were answered. The patient knows  to call the clinic with any problems, questions or concerns. No barriers to learning was detected.  I spent 15 minutes counseling the patient face to face. The total time spent in the appointment was 20 minutes and more than 50% was on counseling and review of test results  Heath Lark, MD 04/17/2019 1:45 PM

## 2019-04-17 NOTE — Assessment & Plan Note (Signed)
He has mild persistent constipation from his narcotic prescription We discussed laxative therapy

## 2019-04-17 NOTE — Assessment & Plan Note (Signed)
His pain control is excellent He will continue dexamethasone and pain medicine as needed

## 2019-04-17 NOTE — Patient Instructions (Signed)
Bortezomib injection What is this medicine? BORTEZOMIB (bor TEZ oh mib) is a medicine that targets proteins in cancer cells and stops the cancer cells from growing. It is used to treat multiple myeloma and mantle-cell lymphoma. This medicine may be used for other purposes; ask your health care provider or pharmacist if you have questions. COMMON BRAND NAME(S): Velcade What should I tell my health care provider before I take this medicine? They need to know if you have any of these conditions:  diabetes  heart disease  irregular heartbeat  liver disease  on hemodialysis  low blood counts, like low white blood cells, platelets, or hemoglobin  peripheral neuropathy  taking medicine for blood pressure  an unusual or allergic reaction to bortezomib, mannitol, boron, other medicines, foods, dyes, or preservatives  pregnant or trying to get pregnant  breast-feeding How should I use this medicine? This medicine is for injection into a vein or for injection under the skin. It is given by a health care professional in a hospital or clinic setting. Talk to your pediatrician regarding the use of this medicine in children. Special care may be needed. Overdosage: If you think you have taken too much of this medicine contact a poison control center or emergency room at once. NOTE: This medicine is only for you. Do not share this medicine with others. What if I miss a dose? It is important not to miss your dose. Call your doctor or health care professional if you are unable to keep an appointment. What may interact with this medicine? This medicine may interact with the following medications:  ketoconazole  rifampin  ritonavir  St. John's Wort This list may not describe all possible interactions. Give your health care provider a list of all the medicines, herbs, non-prescription drugs, or dietary supplements you use. Also tell them if you smoke, drink alcohol, or use illegal drugs. Some  items may interact with your medicine. What should I watch for while using this medicine? You may get drowsy or dizzy. Do not drive, use machinery, or do anything that needs mental alertness until you know how this medicine affects you. Do not stand or sit up quickly, especially if you are an older patient. This reduces the risk of dizzy or fainting spells. In some cases, you may be given additional medicines to help with side effects. Follow all directions for their use. Call your doctor or health care professional for advice if you get a fever, chills or sore throat, or other symptoms of a cold or flu. Do not treat yourself. This drug decreases your body's ability to fight infections. Try to avoid being around people who are sick. This medicine may increase your risk to bruise or bleed. Call your doctor or health care professional if you notice any unusual bleeding. You may need blood work done while you are taking this medicine. In some patients, this medicine may cause a serious brain infection that may cause death. If you have any problems seeing, thinking, speaking, walking, or standing, tell your doctor right away. If you cannot reach your doctor, urgently seek other source of medical care. Check with your doctor or health care professional if you get an attack of severe diarrhea, nausea and vomiting, or if you sweat a lot. The loss of too much body fluid can make it dangerous for you to take this medicine. Do not become pregnant while taking this medicine or for at least 7 months after stopping it. Women should inform their doctor   if they wish to become pregnant or think they might be pregnant. Men should not father a child while taking this medicine and for at least 4 months after stopping it. There is a potential for serious side effects to an unborn child. Talk to your health care professional or pharmacist for more information. Do not breast-feed an infant while taking this medicine or for 2  months after stopping it. This medicine may interfere with the ability to have a child. You should talk with your doctor or health care professional if you are concerned about your fertility. What side effects may I notice from receiving this medicine? Side effects that you should report to your doctor or health care professional as soon as possible:  allergic reactions like skin rash, itching or hives, swelling of the face, lips, or tongue  breathing problems  changes in hearing  changes in vision  fast, irregular heartbeat  feeling faint or lightheaded, falls  pain, tingling, numbness in the hands or feet  right upper belly pain  seizures  swelling of the ankles, feet, hands  unusual bleeding or bruising  unusually weak or tired  vomiting  yellowing of the eyes or skin Side effects that usually do not require medical attention (report to your doctor or health care professional if they continue or are bothersome):  changes in emotions or moods  constipation  diarrhea  loss of appetite  headache  irritation at site where injected  nausea This list may not describe all possible side effects. Call your doctor for medical advice about side effects. You may report side effects to FDA at 1-800-FDA-1088. Where should I keep my medicine? This drug is given in a hospital or clinic and will not be stored at home. NOTE: This sheet is a summary. It may not cover all possible information. If you have questions about this medicine, talk to your doctor, pharmacist, or health care provider.  2020 Elsevier/Gold Standard (2018-01-31 16:29:31)  

## 2019-04-17 NOTE — Telephone Encounter (Signed)
[facility] Encounter  Confirmed with [facility] that Revlimid was shipped on 7/10 with a $0 copay.  [facility] [place] Phone [phone] Fax [phone] 04/17/2019   8:36 AM

## 2019-04-17 NOTE — Assessment & Plan Note (Signed)
Results of bone marrow biopsy is pending The patient is a transplant candidate I recommend combination chemotherapy with Velcade, lenalidomide and dexamethasone I plan to change his dexamethasone to weekly He will get Velcade on days 1, 4, 7 and 11 for cycle of every 21 days He will also be prescribed Revlimid at 15 mg daily for days 1-14, rest 7 days for cycle of every 21 days I recommend aspirin therapy I recommend acyclovir for antimicrobial prophylaxis I recommend calcium with vitamin D He has appointment to see his dentist next week for dental clearance before I prescribe Zometa Due to lack of progression of symptoms, I do not recommend radiation therapy right now but rather to focus on palliative chemotherapy He understood that while multiple myeloma is highly treatable, at this point in time, it is not considered curable I will schedule appointment to see bone marrow transplant team when his final bone marrow results are available The risks, benefits, side effects of chemotherapy treatment were discussed and he agreed to proceed with the plan of care

## 2019-04-18 ENCOUNTER — Encounter: Payer: Self-pay | Admitting: Hematology and Oncology

## 2019-04-19 ENCOUNTER — Other Ambulatory Visit: Payer: Medicare Other

## 2019-04-21 ENCOUNTER — Other Ambulatory Visit: Payer: Self-pay

## 2019-04-21 ENCOUNTER — Inpatient Hospital Stay: Payer: Medicare Other

## 2019-04-21 VITALS — BP 133/78 | HR 79 | Temp 99.2°F | Resp 18

## 2019-04-21 DIAGNOSIS — K5900 Constipation, unspecified: Secondary | ICD-10-CM | POA: Diagnosis not present

## 2019-04-21 DIAGNOSIS — C9000 Multiple myeloma not having achieved remission: Secondary | ICD-10-CM | POA: Diagnosis not present

## 2019-04-21 DIAGNOSIS — G893 Neoplasm related pain (acute) (chronic): Secondary | ICD-10-CM | POA: Diagnosis not present

## 2019-04-21 DIAGNOSIS — R200 Anesthesia of skin: Secondary | ICD-10-CM | POA: Diagnosis not present

## 2019-04-21 DIAGNOSIS — I700 Atherosclerosis of aorta: Secondary | ICD-10-CM | POA: Diagnosis not present

## 2019-04-21 MED ORDER — PROCHLORPERAZINE MALEATE 10 MG PO TABS
10.0000 mg | ORAL_TABLET | Freq: Once | ORAL | Status: AC
  Administered 2019-04-21: 10 mg via ORAL

## 2019-04-21 MED ORDER — PROCHLORPERAZINE MALEATE 10 MG PO TABS
ORAL_TABLET | ORAL | Status: AC
  Filled 2019-04-21: qty 1

## 2019-04-21 MED ORDER — BORTEZOMIB CHEMO SQ INJECTION 3.5 MG (2.5MG/ML)
1.3000 mg/m2 | Freq: Once | INTRAMUSCULAR | Status: AC
  Administered 2019-04-21: 13:00:00 3 mg via SUBCUTANEOUS
  Filled 2019-04-21: qty 1.2

## 2019-04-21 NOTE — Patient Instructions (Signed)
Bortezomib injection What is this medicine? BORTEZOMIB (bor TEZ oh mib) is a medicine that targets proteins in cancer cells and stops the cancer cells from growing. It is used to treat multiple myeloma and mantle-cell lymphoma. This medicine may be used for other purposes; ask your health care provider or pharmacist if you have questions. COMMON BRAND NAME(S): Velcade What should I tell my health care provider before I take this medicine? They need to know if you have any of these conditions:  diabetes  heart disease  irregular heartbeat  liver disease  on hemodialysis  low blood counts, like low white blood cells, platelets, or hemoglobin  peripheral neuropathy  taking medicine for blood pressure  an unusual or allergic reaction to bortezomib, mannitol, boron, other medicines, foods, dyes, or preservatives  pregnant or trying to get pregnant  breast-feeding How should I use this medicine? This medicine is for injection into a vein or for injection under the skin. It is given by a health care professional in a hospital or clinic setting. Talk to your pediatrician regarding the use of this medicine in children. Special care may be needed. Overdosage: If you think you have taken too much of this medicine contact a poison control center or emergency room at once. NOTE: This medicine is only for you. Do not share this medicine with others. What if I miss a dose? It is important not to miss your dose. Call your doctor or health care professional if you are unable to keep an appointment. What may interact with this medicine? This medicine may interact with the following medications:  ketoconazole  rifampin  ritonavir  St. John's Wort This list may not describe all possible interactions. Give your health care provider a list of all the medicines, herbs, non-prescription drugs, or dietary supplements you use. Also tell them if you smoke, drink alcohol, or use illegal drugs. Some  items may interact with your medicine. What should I watch for while using this medicine? You may get drowsy or dizzy. Do not drive, use machinery, or do anything that needs mental alertness until you know how this medicine affects you. Do not stand or sit up quickly, especially if you are an older patient. This reduces the risk of dizzy or fainting spells. In some cases, you may be given additional medicines to help with side effects. Follow all directions for their use. Call your doctor or health care professional for advice if you get a fever, chills or sore throat, or other symptoms of a cold or flu. Do not treat yourself. This drug decreases your body's ability to fight infections. Try to avoid being around people who are sick. This medicine may increase your risk to bruise or bleed. Call your doctor or health care professional if you notice any unusual bleeding. You may need blood work done while you are taking this medicine. In some patients, this medicine may cause a serious brain infection that may cause death. If you have any problems seeing, thinking, speaking, walking, or standing, tell your doctor right away. If you cannot reach your doctor, urgently seek other source of medical care. Check with your doctor or health care professional if you get an attack of severe diarrhea, nausea and vomiting, or if you sweat a lot. The loss of too much body fluid can make it dangerous for you to take this medicine. Do not become pregnant while taking this medicine or for at least 7 months after stopping it. Women should inform their doctor  if they wish to become pregnant or think they might be pregnant. Men should not father a child while taking this medicine and for at least 4 months after stopping it. There is a potential for serious side effects to an unborn child. Talk to your health care professional or pharmacist for more information. Do not breast-feed an infant while taking this medicine or for 2  months after stopping it. This medicine may interfere with the ability to have a child. You should talk with your doctor or health care professional if you are concerned about your fertility. What side effects may I notice from receiving this medicine? Side effects that you should report to your doctor or health care professional as soon as possible:  allergic reactions like skin rash, itching or hives, swelling of the face, lips, or tongue  breathing problems  changes in hearing  changes in vision  fast, irregular heartbeat  feeling faint or lightheaded, falls  pain, tingling, numbness in the hands or feet  right upper belly pain  seizures  swelling of the ankles, feet, hands  unusual bleeding or bruising  unusually weak or tired  vomiting  yellowing of the eyes or skin Side effects that usually do not require medical attention (report to your doctor or health care professional if they continue or are bothersome):  changes in emotions or moods  constipation  diarrhea  loss of appetite  headache  irritation at site where injected  nausea This list may not describe all possible side effects. Call your doctor for medical advice about side effects. You may report side effects to FDA at 1-800-FDA-1088. Where should I keep my medicine? This drug is given in a hospital or clinic and will not be stored at home. NOTE: This sheet is a summary. It may not cover all possible information. If you have questions about this medicine, talk to your doctor, pharmacist, or health care provider.  2020 Elsevier/Gold Standard (2018-01-31 16:29:31)

## 2019-04-24 ENCOUNTER — Encounter (HOSPITAL_COMMUNITY): Payer: Self-pay | Admitting: Hematology and Oncology

## 2019-04-25 ENCOUNTER — Telehealth: Payer: Self-pay

## 2019-04-25 ENCOUNTER — Other Ambulatory Visit: Payer: Self-pay

## 2019-04-25 ENCOUNTER — Other Ambulatory Visit: Payer: Self-pay | Admitting: Hematology and Oncology

## 2019-04-25 ENCOUNTER — Inpatient Hospital Stay: Payer: Medicare Other

## 2019-04-25 ENCOUNTER — Inpatient Hospital Stay (HOSPITAL_BASED_OUTPATIENT_CLINIC_OR_DEPARTMENT_OTHER): Payer: Medicare Other | Admitting: Hematology and Oncology

## 2019-04-25 DIAGNOSIS — I700 Atherosclerosis of aorta: Secondary | ICD-10-CM | POA: Diagnosis not present

## 2019-04-25 DIAGNOSIS — C9000 Multiple myeloma not having achieved remission: Secondary | ICD-10-CM

## 2019-04-25 DIAGNOSIS — K5909 Other constipation: Secondary | ICD-10-CM

## 2019-04-25 DIAGNOSIS — G893 Neoplasm related pain (acute) (chronic): Secondary | ICD-10-CM | POA: Diagnosis not present

## 2019-04-25 DIAGNOSIS — K5900 Constipation, unspecified: Secondary | ICD-10-CM | POA: Diagnosis not present

## 2019-04-25 DIAGNOSIS — R200 Anesthesia of skin: Secondary | ICD-10-CM | POA: Diagnosis not present

## 2019-04-25 LAB — CMP (CANCER CENTER ONLY)
ALT: 52 U/L — ABNORMAL HIGH (ref 0–44)
AST: 27 U/L (ref 15–41)
Albumin: 3.4 g/dL — ABNORMAL LOW (ref 3.5–5.0)
Alkaline Phosphatase: 78 U/L (ref 38–126)
Anion gap: 10 (ref 5–15)
BUN: 17 mg/dL (ref 8–23)
CO2: 24 mmol/L (ref 22–32)
Calcium: 8.8 mg/dL — ABNORMAL LOW (ref 8.9–10.3)
Chloride: 98 mmol/L (ref 98–111)
Creatinine: 0.84 mg/dL (ref 0.61–1.24)
GFR, Est AFR Am: >60 mL/min (ref >60)
GFR, Estimated: >60 mL/min (ref >60)
Glucose, Bld: 142 mg/dL — ABNORMAL HIGH (ref 70–99)
Potassium: 4.1 mmol/L (ref 3.5–5.1)
Sodium: 132 mmol/L — ABNORMAL LOW (ref 135–145)
Total Bilirubin: 0.5 mg/dL (ref 0.3–1.2)
Total Protein: 7.2 g/dL (ref 6.5–8.1)

## 2019-04-25 LAB — CBC WITH DIFFERENTIAL (CANCER CENTER ONLY)
Abs Immature Granulocytes: 0.04 10*3/uL (ref 0.00–0.07)
Basophils Absolute: 0 10*3/uL (ref 0.0–0.1)
Basophils Relative: 0 %
Eosinophils Absolute: 0 10*3/uL (ref 0.0–0.5)
Eosinophils Relative: 0 %
HCT: 43.7 % (ref 39.0–52.0)
Hemoglobin: 14.9 g/dL (ref 13.0–17.0)
Immature Granulocytes: 1 %
Lymphocytes Relative: 6 %
Lymphs Abs: 0.5 10*3/uL — ABNORMAL LOW (ref 0.7–4.0)
MCH: 33.6 pg (ref 26.0–34.0)
MCHC: 34.1 g/dL (ref 30.0–36.0)
MCV: 98.4 fL (ref 80.0–100.0)
Monocytes Absolute: 0.1 10*3/uL (ref 0.1–1.0)
Monocytes Relative: 2 %
Neutro Abs: 6.8 10*3/uL (ref 1.7–7.7)
Neutrophils Relative %: 91 %
Platelet Count: 200 10*3/uL (ref 150–400)
RBC: 4.44 MIL/uL (ref 4.22–5.81)
RDW: 12.7 % (ref 11.5–15.5)
WBC Count: 7.4 10*3/uL (ref 4.0–10.5)
nRBC: 0 % (ref 0.0–0.2)

## 2019-04-25 MED ORDER — PROCHLORPERAZINE MALEATE 10 MG PO TABS
ORAL_TABLET | ORAL | Status: AC
  Filled 2019-04-25: qty 1

## 2019-04-25 MED ORDER — PROCHLORPERAZINE MALEATE 10 MG PO TABS
10.0000 mg | ORAL_TABLET | Freq: Once | ORAL | Status: AC
  Administered 2019-04-25: 13:00:00 10 mg via ORAL

## 2019-04-25 MED ORDER — HYDROCODONE-ACETAMINOPHEN 5-325 MG PO TABS: 1.0000 | ORAL_TABLET | Freq: Four times a day (QID) | ORAL | 0 refills | Status: DC | PRN

## 2019-04-25 MED ORDER — BORTEZOMIB CHEMO SQ INJECTION 3.5 MG (2.5MG/ML)
1.3000 mg/m2 | Freq: Once | INTRAMUSCULAR | Status: AC
  Administered 2019-04-25: 13:00:00 3 mg via SUBCUTANEOUS
  Filled 2019-04-25: qty 1.2

## 2019-04-25 NOTE — Telephone Encounter (Signed)
[facility] on [address] at [address]. Pt verbalizes understanding to take steroids every week regardless of chemo.

## 2019-04-25 NOTE — Patient Instructions (Signed)
Coronavirus (COVID-19) Are you at risk?  Are you at risk for the Coronavirus (COVID-19)?  To be considered HIGH RISK for Coronavirus (COVID-19), you have to meet the following criteria:  . Traveled to [place], [place], [place], [place] or [place]; or in the [place] to [place], [place], [place], or [place]; and have fever, cough, and shortness of breath within the last 2 weeks of travel OR . Been in close contact with a person diagnosed with COVID-19 within the last 2 weeks and have fever, cough, and shortness of breath . IF YOU DO NOT MEET THESE CRITERIA, YOU ARE CONSIDERED LOW RISK FOR COVID-19.  What to do if you are HIGH RISK for COVID-19?  . If you are having a medical emergency, call 911. . Seek medical care right away. Before you go to a doctor's office, urgent care or emergency department, call ahead and tell them about your recent travel, contact with someone diagnosed with COVID-19, and your symptoms. You should receive instructions from your physician's office regarding next steps of care.  . When you arrive at healthcare provider, tell the healthcare staff immediately you have returned from visiting [place], [place], [place], [place] or [place]; or traveled in the [place] to [place], [place], [place], or [place]; in the last two weeks or you have been in close contact with a person diagnosed with COVID-19 in the last 2 weeks.   . Tell the health care staff about your symptoms: fever, cough and shortness of breath. . After you have been seen by a medical provider, you will be either: o Tested for (COVID-19) and discharged home on quarantine except to seek medical care if symptoms worsen, and asked to  - Stay home and avoid contact with others until you get your results (4-5 days)  - Avoid travel on public transportation if possible (such as bus, train, or airplane) or o Sent to the Emergency Department by EMS for evaluation, COVID-19 testing, and possible  admission depending on your condition and test results.  What to do if you are LOW RISK for COVID-19?  Reduce your risk of any infection by using the same precautions used for avoiding the common cold or flu:  . Wash your hands often with soap and warm water for at least 20 seconds.  If soap and water are not readily available, use an alcohol-based hand sanitizer with at least 60% alcohol.  . If coughing or sneezing, cover your mouth and nose by coughing or sneezing into the elbow areas of your shirt or coat, into a tissue or into your sleeve (not your hands). . Avoid shaking hands with others and consider head nods or verbal greetings only. . Avoid touching your eyes, nose, or mouth with unwashed hands.  . Avoid close contact with people who are sick. . Avoid places or events with large numbers of people in one location, like concerts or sporting events. . Carefully consider travel plans you have or are making. . If you are planning any travel outside or inside the [place], visit the CDC's Travelers' Health webpage for the latest health notices. . If you have some symptoms but not all symptoms, continue to monitor at home and seek medical attention if your symptoms worsen. . If you are having a medical emergency, call 911.   ADDITIONAL HEALTHCARE OPTIONS FOR PATIENTS  [facility] / e-Visit: [email or web address]         [facility]: [phone]  [facility]: [phone]                   [facility]: [phone]    [facility] Discharge Instructions for Patients Receiving Chemotherapy  Today you received the following chemotherapy agents Velcade  To help prevent nausea and vomiting after your treatment, we encourage you to take your nausea medication as directed   If you develop nausea and vomiting that is not controlled by your nausea medication, call the clinic.   BELOW ARE  SYMPTOMS THAT SHOULD BE REPORTED IMMEDIATELY:  *FEVER GREATER THAN 100.5 F  *CHILLS WITH OR WITHOUT FEVER  NAUSEA AND VOMITING THAT IS NOT CONTROLLED WITH YOUR NAUSEA MEDICATION  *UNUSUAL SHORTNESS OF BREATH  *UNUSUAL BRUISING OR BLEEDING  TENDERNESS IN MOUTH AND THROAT WITH OR WITHOUT PRESENCE OF ULCERS  *URINARY PROBLEMS  *BOWEL PROBLEMS  UNUSUAL RASH Items with * indicate a potential emergency and should be followed up as soon as possible.  Feel free to call the clinic should you have any questions or concerns. The clinic phone number is [phone].  Please show the CHEMO ALERT CARD at check-in to the Emergency Department and triage nurse.   

## 2019-04-25 NOTE — Telephone Encounter (Signed)
Refill sent.

## 2019-04-25 NOTE — Telephone Encounter (Signed)
Pt called to report he has 19 pain pills left. He also wants to know if he is supposed to take his steroids on the week that he does not have chemo. Please advise.

## 2019-04-25 NOTE — Telephone Encounter (Signed)
Yes, every week whether chemo or not he takes steroids I can refill his pain medication; which pharmacy?

## 2019-04-26 ENCOUNTER — Telehealth: Payer: Self-pay

## 2019-04-26 ENCOUNTER — Other Ambulatory Visit: Payer: Self-pay | Admitting: Hematology and Oncology

## 2019-04-26 ENCOUNTER — Encounter: Payer: Self-pay | Admitting: Hematology and Oncology

## 2019-04-26 DIAGNOSIS — C9000 Multiple myeloma not having achieved remission: Secondary | ICD-10-CM

## 2019-04-26 NOTE — Telephone Encounter (Signed)
Called and left a message asking him to call the office. 

## 2019-04-26 NOTE — Progress Notes (Signed)
[place] OFFICE PROGRESS NOTE  Patient Care Team: Denita Lung, MD as PCP - General (Family Medicine) Heath Lark, MD as Consulting Physician (Hematology and Oncology)  ASSESSMENT & PLAN:  Multiple myeloma without remission ([place]) I have reviewed final bone marrow biopsy including cytogenetics and FISH panel with the patient and his partner over the telephone It does not change treatment decisions He tolerated treatment very well so far He will continue weekly dexamethasone, Revlimid days 1-14, and Velcade days 1, 4, 7 and 11 for cycle of every 21 days Once he has completed dental work, we will proceed with Zometa He will continue calcium with vitamin D He will continue aspirin for DVT prophylaxis and calcium with vitamin D for bone health We discussed the role of bone marrow transplant I will refer him to [facility] for further evaluation  Cancer associated pain He has excellent pain control and he is initiating slow taper of his pain medicine I refilled his prescription pain medicine  Other constipation He is doing well with regular laxatives   Orders Placed This Encounter  Procedures  . Ambulatory referral to Hematology / Oncology    Referral Priority:   Routine    Referral Type:   Consultation    Referral Reason:   Specialty Services Required    Referred to Provider:   [name], MD    Number of Visits Requested:   1    INTERVAL HISTORY: Please see below for problem oriented charting. He returns for further follow-up His partner is available over the telephone to collaborate his history He is doing well His pain is well controlled He is attempting to taper his pain medicine He denies constipation He tolerated chemotherapy well without side effects Denies peripheral neuropathy He is scheduled for dental restoration with his dentist soon  SUMMARY OF ONCOLOGIC HISTORY: Oncology History  Multiple myeloma  without remission ([place])  03/20/2019 Imaging   1. Tumor involving the L5 and S1 and S2 segments of the spine as described with mass effects upon the left L4, L5, S1 and more distal left sacral nerves as described above. 2. Does the patient have a history of malignancy? 3. Multiple plasmacytomas and metastatic disease could give this appearance   03/27/2019 Pathology Results   Soft Tissue Needle Core Biopsy, sacral mass - PLASMABLASTIC NEOPLASM. - SEE COMMENT. Microscopic Comment The sections show needle core biopsy fragments of soft tissue densely infiltrated by a relatively monomorphic infiltrate of atypical plasmacytoid cells characterized by vesicular or partially clumped chromatin and prominent nucleoli. This is associated with scattered mitosis. A battery of immunohistochemical stains was performed and show that the atypical plasmacytoid cells are positive for CD138, CD43 and cytoplasmic kappa. There is also weak positivity for CD56 and partial variable positivity for cyclin D1. The atypical plasmacytoid cells are negative for cytoplasmic lambda, CD10, PAX5, CD79a, CD20, CD3, CD5, CD34, EBV (ISH) and mostly negative for LCA. The overall morphologic and histologic features are most compatible with a plasmablastic neoplasm. The differential diagnosis includes plasmablastic lymphoma and plasmablastic plasmacytoma/myeloma. Based on the overall phenotypic features and the clinical setting, plasmacytoma/myeloma is favored. Clinical correlation and hematologic evaluation is recommended   03/27/2019 Procedure   Technically successful CT-guided biopsy of sacral soft tissue mass.   04/04/2019 Initial Diagnosis   Multiple myeloma without remission ([place])   04/11/2019 PET scan   IMPRESSION: 1. Previously noted expansile lesions involving the L5 vertebra and sacrum are again noted and exhibit intense  FDG uptake compatible with metabolically active disease. A third focus of increased uptake without  corresponding CT abnormality is noted within the intertrochanteric portions of the proximal right femur. 2. Small lucent lesion within the T3 vertebra is noted without corresponding FDG uptake. 3. Asymmetric left bladder wall thickening, etiology indeterminate. 4. Aortic Atherosclerosis (ICD10-I70.0). Lad coronary artery calcification.   04/12/2019 Cancer Staging   Staging form: Plasma Cell Myeloma and Plasma Cell Disorders, AJCC 8th Edition - Clinical stage from 04/12/2019: Beta-2-microglobulin (mg/L): 2, Albumin (g/dL): 3.6, ISS: Stage I, High-risk cytogenetics: Unknown, LDH: Unknown - Signed by Heath Lark, MD on 04/12/2019   04/14/2019 Bone Marrow Biopsy   Bone Marrow, Aspirate,Biopsy, and Clot, right iliac bone - PLASMA CELL MYELOMA.   04/17/2019 -  Chemotherapy   The patient had bortezomib SQ (VELCADE) chemo injection 3 mg, 1.3 mg/m2 = 3 mg, Subcutaneous,  Once, 1 of 4 cycles Administration: 3 mg (04/17/2019), 3 mg (04/25/2019), 3 mg (04/21/2019)  for chemotherapy treatment.      REVIEW OF SYSTEMS:   Constitutional: Denies fevers, chills or abnormal weight loss Eyes: Denies blurriness of vision Ears, nose, mouth, throat, and face: Denies mucositis or sore throat Respiratory: Denies cough, dyspnea or wheezes Cardiovascular: Denies palpitation, chest discomfort or lower extremity swelling Gastrointestinal:  Denies nausea, heartburn or change in bowel habits Skin: Denies abnormal skin rashes Lymphatics: Denies new lymphadenopathy or easy bruising Neurological:Denies numbness, tingling or new weaknesses Behavioral/Psych: Mood is stable, no new changes  All other systems were reviewed with the patient and are negative.  I have reviewed the past medical history, past surgical history, social history and family history with the patient and they are unchanged from previous note.  ALLERGIES:  has No Known Allergies.  MEDICATIONS:  Current Outpatient Medications  Medication Sig Dispense  Refill  . acetaminophen (TYLENOL) 500 MG tablet Take 500 mg by mouth every 6 (six) hours as needed.    Marland Kitchen acyclovir (ZOVIRAX) 400 MG tablet Take 1 tablet (400 mg total) by mouth 2 (two) times daily. 60 tablet 3  . aspirin EC 81 MG tablet Take 81 mg by mouth daily.    . calcium carbonate (TUMS - DOSED IN MG ELEMENTAL CALCIUM) 500 MG chewable tablet Chew 1 tablet by mouth 2 (two) times daily.    . cholecalciferol (VITAMIN D3) 25 MCG (1000 UT) tablet Take 2,000 Units by mouth daily.    Marland Kitchen dexamethasone (DECADRON) 4 MG tablet 5 tabs weekly by mouth 30 tablet 0  . diclofenac sodium (VOLTAREN) 1 % GEL Apply 2 g topically 4 (four) times daily as needed.    Marland Kitchen HYDROcodone-acetaminophen (NORCO) 5-325 MG tablet Take 1 tablet by mouth every 6 (six) hours as needed for moderate pain. 60 tablet 0  . lenalidomide (REVLIMID) 15 MG capsule Take 1 capsule (15 mg total) by mouth daily. Take for 14 days on, 7 days off, repeat every 21 days. [employer] auth# [id number] 14 capsule 0  . lisinopril-hydrochlorothiazide (PRINZIDE,ZESTORETIC) 20-12.5 MG tablet TAKE 1 TABLET BY MOUTH ONCE DAILY 90 tablet 3  . ondansetron (ZOFRAN) 8 MG tablet Take 1 tablet (8 mg total) by mouth 2 (two) times daily as needed (Nausea or vomiting). 30 tablet 1  . prochlorperazine (COMPAZINE) 10 MG tablet Take 1 tablet (10 mg total) by mouth every 6 (six) hours as needed (Nausea or vomiting). 30 tablet 1   No current facility-administered medications for this visit.     PHYSICAL EXAMINATION: ECOG PERFORMANCE STATUS: 0 - Asymptomatic  Vitals:  04/25/19 1214  BP: 120/81  Pulse: 91  Resp: 18  Temp: 98.7 F (37.1 C)  SpO2: 97%   Filed Weights   04/25/19 1214  Weight: 223 lb 3.2 oz (101.2 kg)    GENERAL:alert, no distress and comfortable SKIN: skin color, texture, turgor are normal, no rashes or significant lesions EYES: normal, Conjunctiva are pink and non-injected, sclera clear OROPHARYNX:no exudate, no erythema and lips, buccal mucosa,  and tongue normal  NECK: supple, thyroid normal size, non-tender, without nodularity LYMPH:  no palpable lymphadenopathy in the cervical, axillary or inguinal LUNGS: clear to auscultation and percussion with normal breathing effort HEART: regular rate & rhythm and no murmurs and no lower extremity edema ABDOMEN:abdomen soft, non-tender and normal bowel sounds Musculoskeletal:no cyanosis of digits and no clubbing  NEURO: alert & oriented x 3 with fluent speech, no focal motor/sensory deficits  LABORATORY DATA:  I have reviewed the data as listed    Component Value Date/Time   NA 132 (L) 04/25/2019 1121   NA 139 10/11/2018 1115   NA 139 04/05/2014 0849   K 4.1 04/25/2019 1121   K 4.4 04/05/2014 0849   CL 98 04/25/2019 1121   CO2 24 04/25/2019 1121   CO2 23 04/05/2014 0849   GLUCOSE 142 (H) 04/25/2019 1121   GLUCOSE 109 04/05/2014 0849   BUN 17 04/25/2019 1121   BUN 23 10/11/2018 1115   BUN 16.8 04/05/2014 0849   CREATININE 0.84 04/25/2019 1121   CREATININE 0.91 07/29/2017 1004   CREATININE 0.9 04/05/2014 0849   CALCIUM 8.8 (L) 04/25/2019 1121   CALCIUM 8.7 04/05/2014 0849   PROT 7.2 04/25/2019 1121   PROT 7.2 10/11/2018 1115   PROT 6.7 04/05/2014 0849   ALBUMIN 3.4 (L) 04/25/2019 1121   ALBUMIN 4.7 10/11/2018 1115   ALBUMIN 3.8 04/05/2014 0849   AST 27 04/25/2019 1121   AST 26 04/05/2014 0849   ALT 52 (H) 04/25/2019 1121   ALT 30 04/05/2014 0849   ALKPHOS 78 04/25/2019 1121   ALKPHOS 75 04/05/2014 0849   BILITOT 0.5 04/25/2019 1121   BILITOT 0.71 04/05/2014 0849   GFRNONAA >60 04/25/2019 1121   GFRAA >60 04/25/2019 1121    No results found for: SPEP, UPEP  Lab Results  Component Value Date   WBC 7.4 04/25/2019   NEUTROABS 6.8 04/25/2019   HGB 14.9 04/25/2019   HCT 43.7 04/25/2019   MCV 98.4 04/25/2019   PLT 200 04/25/2019      Chemistry      Component Value Date/Time   NA 132 (L) 04/25/2019 1121   NA 139 10/11/2018 1115   NA 139 04/05/2014 0849   K 4.1  04/25/2019 1121   K 4.4 04/05/2014 0849   CL 98 04/25/2019 1121   CO2 24 04/25/2019 1121   CO2 23 04/05/2014 0849   BUN 17 04/25/2019 1121   BUN 23 10/11/2018 1115   BUN 16.8 04/05/2014 0849   CREATININE 0.84 04/25/2019 1121   CREATININE 0.91 07/29/2017 1004   CREATININE 0.9 04/05/2014 0849   GLU 10 04/10/2014      Component Value Date/Time   CALCIUM 8.8 (L) 04/25/2019 1121   CALCIUM 8.7 04/05/2014 0849   ALKPHOS 78 04/25/2019 1121   ALKPHOS 75 04/05/2014 0849   AST 27 04/25/2019 1121   AST 26 04/05/2014 0849   ALT 52 (H) 04/25/2019 1121   ALT 30 04/05/2014 0849   BILITOT 0.5 04/25/2019 1121   BILITOT 0.71 04/05/2014 0849  RADIOGRAPHIC STUDIES: I have personally reviewed the radiological images as listed and agreed with the findings in the report. Nm Pet Image Initial (pi) Whole Body  Result Date: 04/11/2019 CLINICAL DATA:  Initial treatment strategy for multiple myeloma. EXAM: NUCLEAR MEDICINE PET WHOLE BODY TECHNIQUE: 11.3 mCi F-18 FDG was injected intravenously. Full-ring PET imaging was performed from the skull base to thigh after the radiotracer. CT data was obtained and used for attenuation correction and anatomic localization. Fasting blood glucose: 109 mg/dl COMPARISON:  MRI 03/20/2019 FINDINGS: Mediastinal blood pool activity: SUV max 3.04 HEAD/NECK: No hypermetabolic activity in the scalp. No hypermetabolic cervical lymph nodes. Incidental CT findings: none CHEST: No hypermetabolic mediastinal or hilar nodes. No suspicious pulmonary nodules on the CT scan. Incidental CT findings: Aortic atherosclerosis. Calcification in the LAD coronary artery noted. ABDOMEN/PELVIS: No abnormal hypermetabolic activity within the liver, pancreas, adrenal glands, or spleen. No hypermetabolic lymph nodes in the abdomen or pelvis. Asymmetric left bladder wall thickening is identified which measures 0.8 cm in thickness. Prostate gland enlargement noted. Aortic atherosclerosis. Incidental CT  findings: none SKELETON: There is diffuse heterogeneous radiotracer activity within the bone marrow. Expansile mass involving the left side of the L5 vertebra is again noted. This measures approximately 5.8 cm and has an SUV max of 12.2. Second lesion involving S1 and S2, left of the midline measures 5.2 cm and has an SUV max of 11.8. Small lucent lesion is identified within the left side of the T3 vertebra measuring 8 mm. No significant FDG uptake identified within this lesion. Incidental CT findings: none EXTREMITIES: Within the intertrochanteric portions of the proximal right femur there is a small focus of increased uptake within SUV max of 5.31. No corresponding lytic or sclerotic bone lesion identified on the CT images. Incidental CT findings: none IMPRESSION: 1. Previously noted expansile lesions involving the L5 vertebra and sacrum are again noted and exhibit intense FDG uptake compatible with metabolically active disease. A third focus of increased uptake without corresponding CT abnormality is noted within the intertrochanteric portions of the proximal right femur. 2. Small lucent lesion within the T3 vertebra is noted without corresponding FDG uptake. 3. Asymmetric left bladder wall thickening, etiology indeterminate. 4. Aortic Atherosclerosis (ICD10-I70.0). Lad coronary artery calcification. Electronically Signed   By: Kerby Moors M.D.   On: 04/11/2019 11:47   Ct Biopsy  Result Date: 04/14/2019 INDICATION: 68 year old male with a new diagnosis of multiple myeloma. Bone marrow biopsy is requested to evaluate for marrow involvement. EXAM: CT GUIDED BONE MARROW ASPIRATION AND CORE BIOPSY Interventional Radiologist:  Criselda Peaches, MD MEDICATIONS: None. ANESTHESIA/SEDATION: Moderate (conscious) sedation was employed during this procedure. A total of 3 milligrams versed and 100 micrograms fentanyl were administered intravenously. The patient's level of consciousness and vital signs were monitored  continuously by radiology nursing throughout the procedure under my direct supervision. Total monitored sedation time: 11 minutes FLUOROSCOPY TIME:  None COMPLICATIONS: None immediate. Estimated blood loss: <25 mL PROCEDURE: Informed written consent was obtained from the patient after a thorough discussion of the procedural risks, benefits and alternatives. All questions were addressed. Maximal Sterile Barrier Technique was utilized including caps, mask, sterile gowns, sterile gloves, sterile drape, hand hygiene and skin antiseptic. A timeout was performed prior to the initiation of the procedure. The patient was positioned prone and non-contrast localization CT was performed of the pelvis to demonstrate the iliac marrow spaces. Maximal barrier sterile technique utilized including caps, mask, sterile gowns, sterile gloves, large sterile drape, hand hygiene, and betadine  prep. Under sterile conditions and local anesthesia, an 11 gauge coaxial bone biopsy needle was advanced into the right iliac marrow space. Needle position was confirmed with CT imaging. Initially, bone marrow aspiration was performed. Next, the 11 gauge outer cannula was utilized to obtain a right iliac bone marrow core biopsy. Needle was removed. Hemostasis was obtained with compression. The patient tolerated the procedure well. Samples were prepared with the cytotechnologist. IMPRESSION: Technically successful CT-guided bone marrow aspiration and core biopsy of the right iliac bone. Electronically Signed   By: Jacqulynn Cadet M.D.   On: 04/14/2019 09:54   Ct Bone Marrow Biopsy & Aspiration  Result Date: 04/14/2019 INDICATION: 68 year old male with a new diagnosis of multiple myeloma. Bone marrow biopsy is requested to evaluate for marrow involvement. EXAM: CT GUIDED BONE MARROW ASPIRATION AND CORE BIOPSY Interventional Radiologist:  Criselda Peaches, MD MEDICATIONS: None. ANESTHESIA/SEDATION: Moderate (conscious) sedation was employed  during this procedure. A total of 3 milligrams versed and 100 micrograms fentanyl were administered intravenously. The patient's level of consciousness and vital signs were monitored continuously by radiology nursing throughout the procedure under my direct supervision. Total monitored sedation time: 11 minutes FLUOROSCOPY TIME:  None COMPLICATIONS: None immediate. Estimated blood loss: <25 mL PROCEDURE: Informed written consent was obtained from the patient after a thorough discussion of the procedural risks, benefits and alternatives. All questions were addressed. Maximal Sterile Barrier Technique was utilized including caps, mask, sterile gowns, sterile gloves, sterile drape, hand hygiene and skin antiseptic. A timeout was performed prior to the initiation of the procedure. The patient was positioned prone and non-contrast localization CT was performed of the pelvis to demonstrate the iliac marrow spaces. Maximal barrier sterile technique utilized including caps, mask, sterile gowns, sterile gloves, large sterile drape, hand hygiene, and betadine prep. Under sterile conditions and local anesthesia, an 11 gauge coaxial bone biopsy needle was advanced into the right iliac marrow space. Needle position was confirmed with CT imaging. Initially, bone marrow aspiration was performed. Next, the 11 gauge outer cannula was utilized to obtain a right iliac bone marrow core biopsy. Needle was removed. Hemostasis was obtained with compression. The patient tolerated the procedure well. Samples were prepared with the cytotechnologist. IMPRESSION: Technically successful CT-guided bone marrow aspiration and core biopsy of the right iliac bone. Electronically Signed   By: Jacqulynn Cadet M.D.   On: 04/14/2019 09:54    All questions were answered. The patient knows to call the clinic with any problems, questions or concerns. No barriers to learning was detected.  I spent 25 minutes counseling the patient face to face. The  total time spent in the appointment was 30 minutes and more than 50% was on counseling and review of test results  Heath Lark, MD 04/26/2019 3:07 PM

## 2019-04-26 NOTE — Assessment & Plan Note (Signed)
He is doing well with regular laxatives

## 2019-04-26 NOTE — Assessment & Plan Note (Signed)
I have reviewed final bone marrow biopsy including cytogenetics and FISH panel with the patient and his partner over the telephone It does not change treatment decisions He tolerated treatment very well so far He will continue weekly dexamethasone, Revlimid days 1-14, and Velcade days 1, 4, 7 and 11 for cycle of every 21 days Once he has completed dental work, we will proceed with Zometa He will continue calcium with vitamin D He will continue aspirin for DVT prophylaxis and calcium with vitamin D for bone health We discussed the role of bone marrow transplant I will refer him to [facility] for further evaluation

## 2019-04-26 NOTE — Telephone Encounter (Signed)
Called Dr. Norma Fredrickson office with referral. Office staff ask for faxed referral. Referral faxed to [phone].

## 2019-04-26 NOTE — Telephone Encounter (Signed)
He called back and left a message. He does not need a refill and has Rx at home on hand if needed.

## 2019-04-26 NOTE — Telephone Encounter (Signed)
-----  Message from Heath Lark, MD sent at 04/26/2019  3:08 PM EDT ----- Regarding: referral to [facility] for transplant Please get things faxed and referred to Dr. Norma Fredrickson for BMT evaluation for multiple myeloma

## 2019-04-26 NOTE — Telephone Encounter (Signed)
-----   Message from Heath Lark, MD sent at 04/26/2019  7:53 AM EDT ----- Regarding: compazine refill I just saw him and he stated he has minimal nausea I have received an automated request to refill compazine Does he need compazine refill? Just want to check in case this is a computer generated request

## 2019-04-26 NOTE — Telephone Encounter (Signed)
Called and left below message. Ask him to call the office back. ?

## 2019-04-26 NOTE — Assessment & Plan Note (Signed)
He has excellent pain control and he is initiating slow taper of his pain medicine I refilled his prescription pain medicine

## 2019-04-27 ENCOUNTER — Other Ambulatory Visit: Payer: Self-pay | Admitting: Hematology and Oncology

## 2019-04-27 DIAGNOSIS — C9000 Multiple myeloma not having achieved remission: Secondary | ICD-10-CM

## 2019-04-28 ENCOUNTER — Other Ambulatory Visit: Payer: Self-pay

## 2019-04-28 ENCOUNTER — Inpatient Hospital Stay: Payer: Medicare Other

## 2019-04-28 VITALS — BP 109/69 | HR 69 | Temp 98.4°F | Resp 18

## 2019-04-28 DIAGNOSIS — K5900 Constipation, unspecified: Secondary | ICD-10-CM | POA: Diagnosis not present

## 2019-04-28 DIAGNOSIS — C9000 Multiple myeloma not having achieved remission: Secondary | ICD-10-CM

## 2019-04-28 DIAGNOSIS — G893 Neoplasm related pain (acute) (chronic): Secondary | ICD-10-CM | POA: Diagnosis not present

## 2019-04-28 DIAGNOSIS — R200 Anesthesia of skin: Secondary | ICD-10-CM | POA: Diagnosis not present

## 2019-04-28 DIAGNOSIS — I700 Atherosclerosis of aorta: Secondary | ICD-10-CM | POA: Diagnosis not present

## 2019-04-28 MED ORDER — PROCHLORPERAZINE MALEATE 10 MG PO TABS
ORAL_TABLET | ORAL | Status: AC
  Filled 2019-04-28: qty 1

## 2019-04-28 MED ORDER — BORTEZOMIB CHEMO SQ INJECTION 3.5 MG (2.5MG/ML)
1.3000 mg/m2 | Freq: Once | INTRAMUSCULAR | Status: AC
  Administered 2019-04-28: 3 mg via SUBCUTANEOUS
  Filled 2019-04-28: qty 1.2

## 2019-04-28 MED ORDER — PROCHLORPERAZINE MALEATE 10 MG PO TABS
10.0000 mg | ORAL_TABLET | Freq: Once | ORAL | Status: AC
  Administered 2019-04-28: 10 mg via ORAL

## 2019-04-28 NOTE — Patient Instructions (Signed)
[facility] Discharge Instructions for Patients Receiving Chemotherapy  Today you received the following chemotherapy agents Velcade To help prevent nausea and vomiting after your treatment, we encourage you to take your nausea medication as prescribed.   If you develop nausea and vomiting that is not controlled by your nausea medication, call the clinic.   BELOW ARE SYMPTOMS THAT SHOULD BE REPORTED IMMEDIATELY:  *FEVER GREATER THAN 100.5 F  *CHILLS WITH OR WITHOUT FEVER  NAUSEA AND VOMITING THAT IS NOT CONTROLLED WITH YOUR NAUSEA MEDICATION  *UNUSUAL SHORTNESS OF BREATH  *UNUSUAL BRUISING OR BLEEDING  TENDERNESS IN MOUTH AND THROAT WITH OR WITHOUT PRESENCE OF ULCERS  *URINARY PROBLEMS  *BOWEL PROBLEMS  UNUSUAL RASH Items with * indicate a potential emergency and should be followed up as soon as possible.  Feel free to call the clinic should you have any questions or concerns. The clinic phone number is [phone].  Please show the CHEMO ALERT CARD at check-in to the Emergency Department and triage nurse.   

## 2019-05-02 ENCOUNTER — Other Ambulatory Visit: Payer: Self-pay | Admitting: *Deleted

## 2019-05-02 DIAGNOSIS — C9000 Multiple myeloma not having achieved remission: Secondary | ICD-10-CM

## 2019-05-02 MED ORDER — LENALIDOMIDE 15 MG PO CAPS: 15.0000 mg | ORAL_CAPSULE | Freq: Every day | ORAL | 0 refills | Status: DC

## 2019-05-03 ENCOUNTER — Telehealth: Payer: Self-pay | Admitting: *Deleted

## 2019-05-03 NOTE — Telephone Encounter (Signed)
This is non urgent since he just started on chemo

## 2019-05-03 NOTE — Telephone Encounter (Signed)
Patient has been scheduled for initial visit with Dr. Norma Fredrickson at [facility] for Bone Marrow Transplant on Sept 30th. His nurse Joseph Art is going to talk to Dr. Norma Fredrickson about getting the appointment moved up but is not able to confirm yet. Patient is aware of appt date and time.

## 2019-05-09 ENCOUNTER — Inpatient Hospital Stay: Payer: Medicare Other

## 2019-05-09 ENCOUNTER — Other Ambulatory Visit: Payer: Self-pay

## 2019-05-09 ENCOUNTER — Inpatient Hospital Stay (HOSPITAL_BASED_OUTPATIENT_CLINIC_OR_DEPARTMENT_OTHER): Payer: Medicare Other | Admitting: Hematology and Oncology

## 2019-05-09 ENCOUNTER — Inpatient Hospital Stay: Payer: Medicare Other | Attending: Hematology and Oncology

## 2019-05-09 ENCOUNTER — Encounter: Payer: Self-pay | Admitting: Hematology and Oncology

## 2019-05-09 DIAGNOSIS — C9000 Multiple myeloma not having achieved remission: Secondary | ICD-10-CM

## 2019-05-09 DIAGNOSIS — I700 Atherosclerosis of aorta: Secondary | ICD-10-CM | POA: Diagnosis not present

## 2019-05-09 DIAGNOSIS — Z5111 Encounter for antineoplastic chemotherapy: Secondary | ICD-10-CM | POA: Diagnosis not present

## 2019-05-09 DIAGNOSIS — G893 Neoplasm related pain (acute) (chronic): Secondary | ICD-10-CM

## 2019-05-09 DIAGNOSIS — I2510 Atherosclerotic heart disease of native coronary artery without angina pectoris: Secondary | ICD-10-CM | POA: Diagnosis not present

## 2019-05-09 DIAGNOSIS — Z79899 Other long term (current) drug therapy: Secondary | ICD-10-CM | POA: Insufficient documentation

## 2019-05-09 DIAGNOSIS — Z7982 Long term (current) use of aspirin: Secondary | ICD-10-CM | POA: Diagnosis not present

## 2019-05-09 DIAGNOSIS — L539 Erythematous condition, unspecified: Secondary | ICD-10-CM | POA: Insufficient documentation

## 2019-05-09 LAB — CBC WITH DIFFERENTIAL (CANCER CENTER ONLY)
Abs Immature Granulocytes: 0.01 10*3/uL (ref 0.00–0.07)
Basophils Absolute: 0 10*3/uL (ref 0.0–0.1)
Basophils Relative: 1 %
Eosinophils Absolute: 0.1 10*3/uL (ref 0.0–0.5)
Eosinophils Relative: 2 %
HCT: 41.7 % (ref 39.0–52.0)
Hemoglobin: 14.4 g/dL (ref 13.0–17.0)
Immature Granulocytes: 0 %
Lymphocytes Relative: 23 %
Lymphs Abs: 1.4 10*3/uL (ref 0.7–4.0)
MCH: 33.9 pg (ref 26.0–34.0)
MCHC: 34.5 g/dL (ref 30.0–36.0)
MCV: 98.1 fL (ref 80.0–100.0)
Monocytes Absolute: 1.1 10*3/uL — ABNORMAL HIGH (ref 0.1–1.0)
Monocytes Relative: 18 %
Neutro Abs: 3.3 10*3/uL (ref 1.7–7.7)
Neutrophils Relative %: 56 %
Platelet Count: 310 10*3/uL (ref 150–400)
RBC: 4.25 MIL/uL (ref 4.22–5.81)
RDW: 13.1 % (ref 11.5–15.5)
WBC Count: 6 10*3/uL (ref 4.0–10.5)
nRBC: 0 % (ref 0.0–0.2)

## 2019-05-09 LAB — CMP (CANCER CENTER ONLY)
ALT: 38 U/L (ref 0–44)
AST: 21 U/L (ref 15–41)
Albumin: 3.5 g/dL (ref 3.5–5.0)
Alkaline Phosphatase: 112 U/L (ref 38–126)
Anion gap: 8 (ref 5–15)
BUN: 18 mg/dL (ref 8–23)
CO2: 25 mmol/L (ref 22–32)
Calcium: 8.8 mg/dL — ABNORMAL LOW (ref 8.9–10.3)
Chloride: 100 mmol/L (ref 98–111)
Creatinine: 0.74 mg/dL (ref 0.61–1.24)
GFR, Est AFR Am: >60 mL/min (ref >60)
GFR, Estimated: >60 mL/min (ref >60)
Glucose, Bld: 93 mg/dL (ref 70–99)
Potassium: 4.2 mmol/L (ref 3.5–5.1)
Sodium: 133 mmol/L — ABNORMAL LOW (ref 135–145)
Total Bilirubin: 0.7 mg/dL (ref 0.3–1.2)
Total Protein: 6.6 g/dL (ref 6.5–8.1)

## 2019-05-09 MED ORDER — BORTEZOMIB CHEMO SQ INJECTION 3.5 MG (2.5MG/ML)
1.3000 mg/m2 | Freq: Once | INTRAMUSCULAR | Status: AC
  Administered 2019-05-09: 3 mg via SUBCUTANEOUS
  Filled 2019-05-09: qty 1.2

## 2019-05-09 MED ORDER — PROCHLORPERAZINE MALEATE 10 MG PO TABS
10.0000 mg | ORAL_TABLET | Freq: Once | ORAL | Status: AC
  Administered 2019-05-09: 10 mg via ORAL

## 2019-05-09 MED ORDER — PROCHLORPERAZINE MALEATE 10 MG PO TABS
ORAL_TABLET | ORAL | Status: AC
  Filled 2019-05-09: qty 1

## 2019-05-09 MED ORDER — ACYCLOVIR 400 MG PO TABS: 400.0000 mg | ORAL_TABLET | Freq: Two times a day (BID) | ORAL | 11 refills | Status: DC

## 2019-05-09 MED ORDER — DEXAMETHASONE 4 MG PO TABS: ORAL_TABLET | ORAL | 3 refills | Status: DC

## 2019-05-09 NOTE — Progress Notes (Signed)
[facility] OFFICE PROGRESS NOTE  Patient Care Team: Denita Lung, MD as PCP - General (Family Medicine) Heath Lark, MD as Consulting Physician (Hematology and Oncology)  ASSESSMENT & PLAN:  Multiple myeloma without remission ([place]) He tolerated treatment very well so far Repeat myeloma panel today is pending Clinically, he has near complete resolution of pain He will continue weekly dexamethasone, Revlimid days 1-14, and Velcade days 1, 4, 7 and 11 for cycle of every 21 days Once he has completed dental work, we will proceed with Zometa next month He will continue calcium with vitamin D He will continue aspirin for DVT prophylaxis and calcium with vitamin D for bone health We discussed the role of bone marrow transplant He has appointment to see transplant physician next week at [facility] I will see him on 8/11 for further discussion about plan of care  Cancer associated pain He has minimum bone pain He is attempting to stop taking pain medicine I will start dexamethasone taper slowly   No orders of the defined types were placed in this encounter.   INTERVAL HISTORY: Please see below for problem oriented charting. He returns for chemotherapy and follow-up He tolerated chemotherapy well Denies neuropathy He has minimum erythematous rash at the injection site His bone pain is minimum and he is attempting to wean himself off pain medicine He has dental restoration in 2 weeks  SUMMARY OF ONCOLOGIC HISTORY: Oncology History  Multiple myeloma without remission ([place])  03/20/2019 Imaging   1. Tumor involving the L5 and S1 and S2 segments of the spine as described with mass effects upon the left L4, L5, S1 and more distal left sacral nerves as described above. 2. Does the patient have a history of malignancy? 3. Multiple plasmacytomas and metastatic disease could give this appearance   03/27/2019 Pathology Results   Soft Tissue Needle  Core Biopsy, sacral mass - PLASMABLASTIC NEOPLASM. - SEE COMMENT. Microscopic Comment The sections show needle core biopsy fragments of soft tissue densely infiltrated by a relatively monomorphic infiltrate of atypical plasmacytoid cells characterized by vesicular or partially clumped chromatin and prominent nucleoli. This is associated with scattered mitosis. A battery of immunohistochemical stains was performed and show that the atypical plasmacytoid cells are positive for CD138, CD43 and cytoplasmic kappa. There is also weak positivity for CD56 and partial variable positivity for cyclin D1. The atypical plasmacytoid cells are negative for cytoplasmic lambda, CD10, PAX5, CD79a, CD20, CD3, CD5, CD34, EBV (ISH) and mostly negative for LCA. The overall morphologic and histologic features are most compatible with a plasmablastic neoplasm. The differential diagnosis includes plasmablastic lymphoma and plasmablastic plasmacytoma/myeloma. Based on the overall phenotypic features and the clinical setting, plasmacytoma/myeloma is favored. Clinical correlation and hematologic evaluation is recommended   03/27/2019 Procedure   Technically successful CT-guided biopsy of sacral soft tissue mass.   04/04/2019 Initial Diagnosis   Multiple myeloma without remission ([place])   04/11/2019 PET scan   IMPRESSION: 1. Previously noted expansile lesions involving the L5 vertebra and sacrum are again noted and exhibit intense FDG uptake compatible with metabolically active disease. A third focus of increased uptake without corresponding CT abnormality is noted within the intertrochanteric portions of the proximal right femur. 2. Small lucent lesion within the T3 vertebra is noted without corresponding FDG uptake. 3. Asymmetric left bladder wall thickening, etiology indeterminate. 4. Aortic Atherosclerosis (ICD10-I70.0). Lad coronary artery calcification.   04/12/2019 Cancer Staging   Staging form: Plasma Cell Myeloma and  Plasma  Cell Disorders, AJCC 8th Edition - Clinical stage from 04/12/2019: Beta-2-microglobulin (mg/L): 2, Albumin (g/dL): 3.6, ISS: Stage I, High-risk cytogenetics: Unknown, LDH: Unknown - Signed by Heath Lark, MD on 04/12/2019   04/14/2019 Bone Marrow Biopsy   Bone Marrow, Aspirate,Biopsy, and Clot, right iliac bone - PLASMA CELL MYELOMA.   04/17/2019 -  Chemotherapy   The patient had bortezomib SQ (VELCADE) chemo injection 3 mg, 1.3 mg/m2 = 3 mg, Subcutaneous,  Once, 2 of 4 cycles Administration: 3 mg (04/17/2019), 3 mg (04/25/2019), 3 mg (04/21/2019), 3 mg (04/28/2019), 3 mg (05/09/2019)  for chemotherapy treatment.      REVIEW OF SYSTEMS:   Constitutional: Denies fevers, chills or abnormal weight loss Eyes: Denies blurriness of vision Ears, nose, mouth, throat, and face: Denies mucositis or sore throat Respiratory: Denies cough, dyspnea or wheezes Cardiovascular: Denies palpitation, chest discomfort or lower extremity swelling Gastrointestinal:  Denies nausea, heartburn or change in bowel habits Skin: Denies abnormal skin rashes Lymphatics: Denies new lymphadenopathy or easy bruising Neurological:Denies numbness, tingling or new weaknesses Behavioral/Psych: Mood is stable, no new changes  All other systems were reviewed with the patient and are negative.  I have reviewed the past medical history, past surgical history, social history and family history with the patient and they are unchanged from previous note.  ALLERGIES:  has No Known Allergies.  MEDICATIONS:  Current Outpatient Medications  Medication Sig Dispense Refill  . acetaminophen (TYLENOL) 500 MG tablet Take 500 mg by mouth every 6 (six) hours as needed.    Marland Kitchen acyclovir (ZOVIRAX) 400 MG tablet Take 1 tablet (400 mg total) by mouth 2 (two) times daily. 180 tablet 11  . aspirin EC 81 MG tablet Take 81 mg by mouth daily.    . calcium carbonate (TUMS - DOSED IN MG ELEMENTAL CALCIUM) 500 MG chewable tablet Chew 1 tablet by mouth 2  (two) times daily.    . cholecalciferol (VITAMIN D3) 25 MCG (1000 UT) tablet Take 2,000 Units by mouth daily.    Marland Kitchen dexamethasone (DECADRON) 4 MG tablet 4 tabs weekly by mouth 16 tablet 3  . diclofenac sodium (VOLTAREN) 1 % GEL Apply 2 g topically 4 (four) times daily as needed.    Marland Kitchen HYDROcodone-acetaminophen (NORCO) 5-325 MG tablet Take 1 tablet by mouth every 6 (six) hours as needed for moderate pain. 60 tablet 0  . lenalidomide (REVLIMID) 15 MG capsule Take 1 capsule (15 mg total) by mouth daily. Take for 14 days on, 7 days off, repeat every 21 days. Cel[employer] ZHYQ#[id number] 14 capsule 0  . lisinopril-hydrochlorothiazide (PRINZIDE,ZESTORETIC) 20-12.5 MG tablet TAKE 1 TABLET BY MOUTH ONCE DAILY 90 tablet 3  . ondansetron (ZOFRAN) 8 MG tablet Take 1 tablet (8 mg total) by mouth 2 (two) times daily as needed (Nausea or vomiting). 30 tablet 1  . prochlorperazine (COMPAZINE) 10 MG tablet Take 1 tablet (10 mg total) by mouth every 6 (six) hours as needed (Nausea or vomiting). 30 tablet 1   No current facility-administered medications for this visit.     PHYSICAL EXAMINATION: ECOG PERFORMANCE STATUS: 0 - Asymptomatic  Vitals:   05/09/19 1238  BP: (!) 141/77  Pulse: 91  Resp: 18  Temp: 98.5 F (36.9 C)  SpO2: 97%   Filed Weights   05/09/19 1238  Weight: 220 lb 12.8 oz (100.2 kg)    GENERAL:alert, no distress and comfortable SKIN: He has faint rash at injection site EYES: normal, Conjunctiva are pink and non-injected, sclera clear OROPHARYNX:no exudate, no erythema and lips,  buccal mucosa, and tongue normal  NECK: supple, thyroid normal size, non-tender, without nodularity LYMPH:  no palpable lymphadenopathy in the cervical, axillary or inguinal LUNGS: clear to auscultation and percussion with normal breathing effort HEART: regular rate & rhythm and no murmurs and no lower extremity edema ABDOMEN:abdomen soft, non-tender and normal bowel sounds Musculoskeletal:no cyanosis of digits and no  clubbing  NEURO: alert & oriented x 3 with fluent speech, no focal motor/sensory deficits  LABORATORY DATA:  I have reviewed the data as listed    Component Value Date/Time   NA 133 (L) 05/09/2019 1204   NA 139 10/11/2018 1115   NA 139 04/05/2014 0849   K 4.2 05/09/2019 1204   K 4.4 04/05/2014 0849   CL 100 05/09/2019 1204   CO2 25 05/09/2019 1204   CO2 23 04/05/2014 0849   GLUCOSE 93 05/09/2019 1204   GLUCOSE 109 04/05/2014 0849   BUN 18 05/09/2019 1204   BUN 23 10/11/2018 1115   BUN 16.8 04/05/2014 0849   CREATININE 0.74 05/09/2019 1204   CREATININE 0.91 07/29/2017 1004   CREATININE 0.9 04/05/2014 0849   CALCIUM 8.8 (L) 05/09/2019 1204   CALCIUM 8.7 04/05/2014 0849   PROT 6.6 05/09/2019 1204   PROT 7.2 10/11/2018 1115   PROT 6.7 04/05/2014 0849   ALBUMIN 3.5 05/09/2019 1204   ALBUMIN 4.7 10/11/2018 1115   ALBUMIN 3.8 04/05/2014 0849   AST 21 05/09/2019 1204   AST 26 04/05/2014 0849   ALT 38 05/09/2019 1204   ALT 30 04/05/2014 0849   ALKPHOS 112 05/09/2019 1204   ALKPHOS 75 04/05/2014 0849   BILITOT 0.7 05/09/2019 1204   BILITOT 0.71 04/05/2014 0849   GFRNONAA >60 05/09/2019 1204   GFRAA >60 05/09/2019 1204    No results found for: SPEP, UPEP  Lab Results  Component Value Date   WBC 6.0 05/09/2019   NEUTROABS 3.3 05/09/2019   HGB 14.4 05/09/2019   HCT 41.7 05/09/2019   MCV 98.1 05/09/2019   PLT 310 05/09/2019      Chemistry      Component Value Date/Time   NA 133 (L) 05/09/2019 1204   NA 139 10/11/2018 1115   NA 139 04/05/2014 0849   K 4.2 05/09/2019 1204   K 4.4 04/05/2014 0849   CL 100 05/09/2019 1204   CO2 25 05/09/2019 1204   CO2 23 04/05/2014 0849   BUN 18 05/09/2019 1204   BUN 23 10/11/2018 1115   BUN 16.8 04/05/2014 0849   CREATININE 0.74 05/09/2019 1204   CREATININE 0.91 07/29/2017 1004   CREATININE 0.9 04/05/2014 0849   GLU 10 04/10/2014      Component Value Date/Time   CALCIUM 8.8 (L) 05/09/2019 1204   CALCIUM 8.7 04/05/2014 0849    ALKPHOS 112 05/09/2019 1204   ALKPHOS 75 04/05/2014 0849   AST 21 05/09/2019 1204   AST 26 04/05/2014 0849   ALT 38 05/09/2019 1204   ALT 30 04/05/2014 0849   BILITOT 0.7 05/09/2019 1204   BILITOT 0.71 04/05/2014 0849       RADIOGRAPHIC STUDIES: I have personally reviewed the radiological images as listed and agreed with the findings in the report. Nm Pet Image Initial (pi) Whole Body  Result Date: 04/11/2019 CLINICAL DATA:  Initial treatment strategy for multiple myeloma. EXAM: NUCLEAR MEDICINE PET WHOLE BODY TECHNIQUE: 11.3 mCi F-18 FDG was injected intravenously. Full-ring PET imaging was performed from the skull base to thigh after the radiotracer. CT data was obtained and used for  attenuation correction and anatomic localization. Fasting blood glucose: 109 mg/dl COMPARISON:  MRI 03/20/2019 FINDINGS: Mediastinal blood pool activity: SUV max 3.04 HEAD/NECK: No hypermetabolic activity in the scalp. No hypermetabolic cervical lymph nodes. Incidental CT findings: none CHEST: No hypermetabolic mediastinal or hilar nodes. No suspicious pulmonary nodules on the CT scan. Incidental CT findings: Aortic atherosclerosis. Calcification in the LAD coronary artery noted. ABDOMEN/PELVIS: No abnormal hypermetabolic activity within the liver, pancreas, adrenal glands, or spleen. No hypermetabolic lymph nodes in the abdomen or pelvis. Asymmetric left bladder wall thickening is identified which measures 0.8 cm in thickness. Prostate gland enlargement noted. Aortic atherosclerosis. Incidental CT findings: none SKELETON: There is diffuse heterogeneous radiotracer activity within the bone marrow. Expansile mass involving the left side of the L5 vertebra is again noted. This measures approximately 5.8 cm and has an SUV max of 12.2. Second lesion involving S1 and S2, left of the midline measures 5.2 cm and has an SUV max of 11.8. Small lucent lesion is identified within the left side of the T3 vertebra measuring 8 mm.  No significant FDG uptake identified within this lesion. Incidental CT findings: none EXTREMITIES: Within the intertrochanteric portions of the proximal right femur there is a small focus of increased uptake within SUV max of 5.31. No corresponding lytic or sclerotic bone lesion identified on the CT images. Incidental CT findings: none IMPRESSION: 1. Previously noted expansile lesions involving the L5 vertebra and sacrum are again noted and exhibit intense FDG uptake compatible with metabolically active disease. A third focus of increased uptake without corresponding CT abnormality is noted within the intertrochanteric portions of the proximal right femur. 2. Small lucent lesion within the T3 vertebra is noted without corresponding FDG uptake. 3. Asymmetric left bladder wall thickening, etiology indeterminate. 4. Aortic Atherosclerosis (ICD10-I70.0). Lad coronary artery calcification. Electronically Signed   By: Kerby Moors M.D.   On: 04/11/2019 11:47   Ct Biopsy  Result Date: 04/14/2019 INDICATION: 67 year old male with a new diagnosis of multiple myeloma. Bone marrow biopsy is requested to evaluate for marrow involvement. EXAM: CT GUIDED BONE MARROW ASPIRATION AND CORE BIOPSY Interventional Radiologist:  Criselda Peaches, MD MEDICATIONS: None. ANESTHESIA/SEDATION: Moderate (conscious) sedation was employed during this procedure. A total of 3 milligrams versed and 100 micrograms fentanyl were administered intravenously. The patient's level of consciousness and vital signs were monitored continuously by radiology nursing throughout the procedure under my direct supervision. Total monitored sedation time: 11 minutes FLUOROSCOPY TIME:  None COMPLICATIONS: None immediate. Estimated blood loss: <25 mL PROCEDURE: Informed written consent was obtained from the patient after a thorough discussion of the procedural risks, benefits and alternatives. All questions were addressed. Maximal Sterile Barrier Technique  was utilized including caps, mask, sterile gowns, sterile gloves, sterile drape, hand hygiene and skin antiseptic. A timeout was performed prior to the initiation of the procedure. The patient was positioned prone and non-contrast localization CT was performed of the pelvis to demonstrate the iliac marrow spaces. Maximal barrier sterile technique utilized including caps, mask, sterile gowns, sterile gloves, large sterile drape, hand hygiene, and betadine prep. Under sterile conditions and local anesthesia, an 11 gauge coaxial bone biopsy needle was advanced into the right iliac marrow space. Needle position was confirmed with CT imaging. Initially, bone marrow aspiration was performed. Next, the 11 gauge outer cannula was utilized to obtain a right iliac bone marrow core biopsy. Needle was removed. Hemostasis was obtained with compression. The patient tolerated the procedure well. Samples were prepared with the cytotechnologist. IMPRESSION: Technically  successful CT-guided bone marrow aspiration and core biopsy of the right iliac bone. Electronically Signed   By: Jacqulynn Cadet M.D.   On: 04/14/2019 09:54   Ct Bone Marrow Biopsy & Aspiration  Result Date: 04/14/2019 INDICATION: 67 year old male with a new diagnosis of multiple myeloma. Bone marrow biopsy is requested to evaluate for marrow involvement. EXAM: CT GUIDED BONE MARROW ASPIRATION AND CORE BIOPSY Interventional Radiologist:  Criselda Peaches, MD MEDICATIONS: None. ANESTHESIA/SEDATION: Moderate (conscious) sedation was employed during this procedure. A total of 3 milligrams versed and 100 micrograms fentanyl were administered intravenously. The patient's level of consciousness and vital signs were monitored continuously by radiology nursing throughout the procedure under my direct supervision. Total monitored sedation time: 11 minutes FLUOROSCOPY TIME:  None COMPLICATIONS: None immediate. Estimated blood loss: <25 mL PROCEDURE: Informed written  consent was obtained from the patient after a thorough discussion of the procedural risks, benefits and alternatives. All questions were addressed. Maximal Sterile Barrier Technique was utilized including caps, mask, sterile gowns, sterile gloves, sterile drape, hand hygiene and skin antiseptic. A timeout was performed prior to the initiation of the procedure. The patient was positioned prone and non-contrast localization CT was performed of the pelvis to demonstrate the iliac marrow spaces. Maximal barrier sterile technique utilized including caps, mask, sterile gowns, sterile gloves, large sterile drape, hand hygiene, and betadine prep. Under sterile conditions and local anesthesia, an 11 gauge coaxial bone biopsy needle was advanced into the right iliac marrow space. Needle position was confirmed with CT imaging. Initially, bone marrow aspiration was performed. Next, the 11 gauge outer cannula was utilized to obtain a right iliac bone marrow core biopsy. Needle was removed. Hemostasis was obtained with compression. The patient tolerated the procedure well. Samples were prepared with the cytotechnologist. IMPRESSION: Technically successful CT-guided bone marrow aspiration and core biopsy of the right iliac bone. Electronically Signed   By: Jacqulynn Cadet M.D.   On: 04/14/2019 09:54    All questions were answered. The patient knows to call the clinic with any problems, questions or concerns. No barriers to learning was detected.  I spent 15 minutes counseling the patient face to face. The total time spent in the appointment was 20 minutes and more than 50% was on counseling and review of test results  Heath Lark, MD 05/09/2019 3:07 PM

## 2019-05-09 NOTE — Patient Instructions (Signed)
[facility] Discharge Instructions for Patients Receiving Chemotherapy  Today you received the following chemotherapy agents Velcade.  To help prevent nausea and vomiting after your treatment, we encourage you to take your nausea medication as directed.  If you develop nausea and vomiting that is not controlled by your nausea medication, call the clinic.   BELOW ARE SYMPTOMS THAT SHOULD BE REPORTED IMMEDIATELY:  *FEVER GREATER THAN 100.5 F  *CHILLS WITH OR WITHOUT FEVER  NAUSEA AND VOMITING THAT IS NOT CONTROLLED WITH YOUR NAUSEA MEDICATION  *UNUSUAL SHORTNESS OF BREATH  *UNUSUAL BRUISING OR BLEEDING  TENDERNESS IN MOUTH AND THROAT WITH OR WITHOUT PRESENCE OF ULCERS  *URINARY PROBLEMS  *BOWEL PROBLEMS  UNUSUAL RASH Items with * indicate a potential emergency and should be followed up as soon as possible.  Feel free to call the clinic should you have any questions or concerns. The clinic phone number is [phone].  Please show the CHEMO ALERT CARD at check-in to the Emergency Department and triage nurse.   

## 2019-05-09 NOTE — Assessment & Plan Note (Signed)
He tolerated treatment very well so far Repeat myeloma panel today is pending Clinically, he has near complete resolution of pain He will continue weekly dexamethasone, Revlimid days 1-14, and Velcade days 1, 4, 7 and 11 for cycle of every 21 days Once he has completed dental work, we will proceed with Zometa next month He will continue calcium with vitamin D He will continue aspirin for DVT prophylaxis and calcium with vitamin D for bone health We discussed the role of bone marrow transplant He has appointment to see transplant physician next week at [facility] I will see him on 8/11 for further discussion about plan of care

## 2019-05-09 NOTE — Assessment & Plan Note (Signed)
He has minimum bone pain He is attempting to stop taking pain medicine I will start dexamethasone taper slowly

## 2019-05-10 ENCOUNTER — Telehealth: Payer: Self-pay | Admitting: Hematology and Oncology

## 2019-05-10 LAB — KAPPA/LAMBDA LIGHT CHAINS
Kappa free light chain: 32.5 mg/L — ABNORMAL HIGH (ref 3.3–19.4)
Kappa, lambda light chain ratio: 2.66 — ABNORMAL HIGH (ref 0.26–1.65)
Lambda free light chains: 12.2 mg/L (ref 5.7–26.3)

## 2019-05-10 LAB — MULTIPLE MYELOMA PANEL, SERUM
Albumin SerPl Elph-Mcnc: 3.3 g/dL (ref 2.9–4.4)
Albumin/Glob SerPl: 1.3 (ref 0.7–1.7)
Alpha 1: 0.2 g/dL (ref 0.0–0.4)
Alpha2 Glob SerPl Elph-Mcnc: 0.8 g/dL (ref 0.4–1.0)
B-Globulin SerPl Elph-Mcnc: 0.8 g/dL (ref 0.7–1.3)
Gamma Glob SerPl Elph-Mcnc: 0.7 g/dL (ref 0.4–1.8)
Globulin, Total: 2.6 g/dL (ref 2.2–3.9)
IgA: 109 mg/dL (ref 61–437)
IgG (Immunoglobin G), Serum: 892 mg/dL (ref 603–1613)
IgM (Immunoglobulin M), Srm: 44 mg/dL (ref 20–172)
M Protein SerPl Elph-Mcnc: 0.5 g/dL — ABNORMAL HIGH
Total Protein ELP: 5.9 g/dL — ABNORMAL LOW (ref 6.0–8.5)

## 2019-05-10 NOTE — Telephone Encounter (Signed)
I talk with patient regarding schedule  

## 2019-05-12 ENCOUNTER — Other Ambulatory Visit: Payer: Self-pay

## 2019-05-12 ENCOUNTER — Inpatient Hospital Stay: Payer: Medicare Other

## 2019-05-12 VITALS — BP 103/64 | HR 95 | Temp 98.5°F | Resp 18

## 2019-05-12 DIAGNOSIS — L539 Erythematous condition, unspecified: Secondary | ICD-10-CM | POA: Diagnosis not present

## 2019-05-12 DIAGNOSIS — I700 Atherosclerosis of aorta: Secondary | ICD-10-CM | POA: Diagnosis not present

## 2019-05-12 DIAGNOSIS — C9000 Multiple myeloma not having achieved remission: Secondary | ICD-10-CM | POA: Diagnosis not present

## 2019-05-12 DIAGNOSIS — Z5111 Encounter for antineoplastic chemotherapy: Secondary | ICD-10-CM | POA: Diagnosis not present

## 2019-05-12 DIAGNOSIS — G893 Neoplasm related pain (acute) (chronic): Secondary | ICD-10-CM | POA: Diagnosis not present

## 2019-05-12 DIAGNOSIS — I2510 Atherosclerotic heart disease of native coronary artery without angina pectoris: Secondary | ICD-10-CM | POA: Diagnosis not present

## 2019-05-12 MED ORDER — PROCHLORPERAZINE MALEATE 10 MG PO TABS
10.0000 mg | ORAL_TABLET | Freq: Once | ORAL | Status: AC
  Administered 2019-05-12: 10 mg via ORAL

## 2019-05-12 MED ORDER — BORTEZOMIB CHEMO SQ INJECTION 3.5 MG (2.5MG/ML)
1.3000 mg/m2 | Freq: Once | INTRAMUSCULAR | Status: AC
  Administered 2019-05-12: 3 mg via SUBCUTANEOUS
  Filled 2019-05-12: qty 1.2

## 2019-05-12 MED ORDER — PROCHLORPERAZINE MALEATE 10 MG PO TABS
ORAL_TABLET | ORAL | Status: AC
  Filled 2019-05-12: qty 1

## 2019-05-12 NOTE — Patient Instructions (Signed)
Coronavirus (COVID-19) Are you at risk?  Are you at risk for the Coronavirus (COVID-19)?  To be considered HIGH RISK for Coronavirus (COVID-19), you have to meet the following criteria:  . Traveled to [place], [place], [place], [place] or [place]; or in the [place] to [place], [place], [place], or [place]; and have fever, cough, and shortness of breath within the last 2 weeks of travel OR . Been in close contact with a person diagnosed with COVID-19 within the last 2 weeks and have fever, cough, and shortness of breath . IF YOU DO NOT MEET THESE CRITERIA, YOU ARE CONSIDERED LOW RISK FOR COVID-19.  What to do if you are HIGH RISK for COVID-19?  . If you are having a medical emergency, call 911. . Seek medical care right away. Before you go to a doctor's office, urgent care or emergency department, call ahead and tell them about your recent travel, contact with someone diagnosed with COVID-19, and your symptoms. You should receive instructions from your physician's office regarding next steps of care.  . When you arrive at healthcare provider, tell the healthcare staff immediately you have returned from visiting [place], [place], [place], [place] or [place]; or traveled in the [place] to [place], [place], [place], or [place]; in the last two weeks or you have been in close contact with a person diagnosed with COVID-19 in the last 2 weeks.   . Tell the health care staff about your symptoms: fever, cough and shortness of breath. . After you have been seen by a medical provider, you will be either: o Tested for (COVID-19) and discharged home on quarantine except to seek medical care if symptoms worsen, and asked to  - Stay home and avoid contact with others until you get your results (4-5 days)  - Avoid travel on public transportation if possible (such as bus, train, or airplane) or o Sent to the Emergency Department by EMS for evaluation, COVID-19 testing, and possible  admission depending on your condition and test results.  What to do if you are LOW RISK for COVID-19?  Reduce your risk of any infection by using the same precautions used for avoiding the common cold or flu:  . Wash your hands often with soap and warm water for at least 20 seconds.  If soap and water are not readily available, use an alcohol-based hand sanitizer with at least 60% alcohol.  . If coughing or sneezing, cover your mouth and nose by coughing or sneezing into the elbow areas of your shirt or coat, into a tissue or into your sleeve (not your hands). . Avoid shaking hands with others and consider head nods or verbal greetings only. . Avoid touching your eyes, nose, or mouth with unwashed hands.  . Avoid close contact with people who are sick. . Avoid places or events with large numbers of people in one location, like concerts or sporting events. . Carefully consider travel plans you have or are making. . If you are planning any travel outside or inside the [place], visit the CDC's Travelers' Health webpage for the latest health notices. . If you have some symptoms but not all symptoms, continue to monitor at home and seek medical attention if your symptoms worsen. . If you are having a medical emergency, call 911.   ADDITIONAL HEALTHCARE OPTIONS FOR PATIENTS  [facility] / e-Visit: [email or web address]         [facility]: [phone]  [facility]: [phone]                   [facility]: [phone]    [facility] Discharge Instructions for Patients Receiving Chemotherapy  Today you received the following chemotherapy agents Velcade  To help prevent nausea and vomiting after your treatment, we encourage you to take your nausea medication as directed   If you develop nausea and vomiting that is not controlled by your nausea medication, call the clinic.   BELOW ARE  SYMPTOMS THAT SHOULD BE REPORTED IMMEDIATELY:  *FEVER GREATER THAN 100.5 F  *CHILLS WITH OR WITHOUT FEVER  NAUSEA AND VOMITING THAT IS NOT CONTROLLED WITH YOUR NAUSEA MEDICATION  *UNUSUAL SHORTNESS OF BREATH  *UNUSUAL BRUISING OR BLEEDING  TENDERNESS IN MOUTH AND THROAT WITH OR WITHOUT PRESENCE OF ULCERS  *URINARY PROBLEMS  *BOWEL PROBLEMS  UNUSUAL RASH Items with * indicate a potential emergency and should be followed up as soon as possible.  Feel free to call the clinic should you have any questions or concerns. The clinic phone number is [phone].  Please show the CHEMO ALERT CARD at check-in to the Emergency Department and triage nurse.   

## 2019-05-15 ENCOUNTER — Telehealth: Payer: Self-pay | Admitting: *Deleted

## 2019-05-15 DIAGNOSIS — Z79899 Other long term (current) drug therapy: Secondary | ICD-10-CM | POA: Diagnosis not present

## 2019-05-15 DIAGNOSIS — C9000 Multiple myeloma not having achieved remission: Secondary | ICD-10-CM | POA: Diagnosis not present

## 2019-05-15 DIAGNOSIS — C9030 Solitary plasmacytoma not having achieved remission: Secondary | ICD-10-CM | POA: Diagnosis not present

## 2019-05-15 NOTE — Telephone Encounter (Signed)
Telephone call to patient and advised lab results as directed below. Patient verbalized an understanding.

## 2019-05-15 NOTE — Telephone Encounter (Signed)
-----   Message from Heath Lark, MD sent at 05/11/2019  8:38 AM EDT ----- Regarding: good news Let him know all his numbers (IgG, kappa light chain and M protein) has dropped to about 1/3 of what his baseline before chemo.

## 2019-05-16 ENCOUNTER — Inpatient Hospital Stay: Payer: Medicare Other

## 2019-05-16 ENCOUNTER — Encounter: Payer: Self-pay | Admitting: Hematology and Oncology

## 2019-05-16 ENCOUNTER — Inpatient Hospital Stay (HOSPITAL_BASED_OUTPATIENT_CLINIC_OR_DEPARTMENT_OTHER): Payer: Medicare Other | Admitting: Hematology and Oncology

## 2019-05-16 ENCOUNTER — Other Ambulatory Visit: Payer: Self-pay

## 2019-05-16 DIAGNOSIS — L539 Erythematous condition, unspecified: Secondary | ICD-10-CM | POA: Diagnosis not present

## 2019-05-16 DIAGNOSIS — C9000 Multiple myeloma not having achieved remission: Secondary | ICD-10-CM | POA: Diagnosis not present

## 2019-05-16 DIAGNOSIS — G893 Neoplasm related pain (acute) (chronic): Secondary | ICD-10-CM | POA: Diagnosis not present

## 2019-05-16 DIAGNOSIS — Z5111 Encounter for antineoplastic chemotherapy: Secondary | ICD-10-CM | POA: Diagnosis not present

## 2019-05-16 DIAGNOSIS — I700 Atherosclerosis of aorta: Secondary | ICD-10-CM | POA: Diagnosis not present

## 2019-05-16 DIAGNOSIS — I2510 Atherosclerotic heart disease of native coronary artery without angina pectoris: Secondary | ICD-10-CM | POA: Diagnosis not present

## 2019-05-16 LAB — CMP (CANCER CENTER ONLY)
ALT: 37 U/L (ref 0–44)
AST: 22 U/L (ref 15–41)
Albumin: 3.7 g/dL (ref 3.5–5.0)
Alkaline Phosphatase: 110 U/L (ref 38–126)
Anion gap: 11 (ref 5–15)
BUN: 12 mg/dL (ref 8–23)
CO2: 24 mmol/L (ref 22–32)
Calcium: 9.4 mg/dL (ref 8.9–10.3)
Chloride: 99 mmol/L (ref 98–111)
Creatinine: 0.81 mg/dL (ref 0.61–1.24)
GFR, Est AFR Am: >60 mL/min (ref >60)
GFR, Estimated: >60 mL/min (ref >60)
Glucose, Bld: 103 mg/dL — ABNORMAL HIGH (ref 70–99)
Potassium: 4.3 mmol/L (ref 3.5–5.1)
Sodium: 134 mmol/L — ABNORMAL LOW (ref 135–145)
Total Bilirubin: 0.8 mg/dL (ref 0.3–1.2)
Total Protein: 6.8 g/dL (ref 6.5–8.1)

## 2019-05-16 LAB — CBC WITH DIFFERENTIAL (CANCER CENTER ONLY)
Abs Immature Granulocytes: 0.13 10*3/uL — ABNORMAL HIGH (ref 0.00–0.07)
Basophils Absolute: 0 10*3/uL (ref 0.0–0.1)
Basophils Relative: 0 %
Eosinophils Absolute: 0 10*3/uL (ref 0.0–0.5)
Eosinophils Relative: 0 %
HCT: 42.5 % (ref 39.0–52.0)
Hemoglobin: 14.6 g/dL (ref 13.0–17.0)
Immature Granulocytes: 2 %
Lymphocytes Relative: 11 %
Lymphs Abs: 0.7 10*3/uL (ref 0.7–4.0)
MCH: 33.6 pg (ref 26.0–34.0)
MCHC: 34.4 g/dL (ref 30.0–36.0)
MCV: 97.7 fL (ref 80.0–100.0)
Monocytes Absolute: 0.2 10*3/uL (ref 0.1–1.0)
Monocytes Relative: 3 %
Neutro Abs: 5 10*3/uL (ref 1.7–7.7)
Neutrophils Relative %: 84 %
Platelet Count: 251 10*3/uL (ref 150–400)
RBC: 4.35 MIL/uL (ref 4.22–5.81)
RDW: 13.2 % (ref 11.5–15.5)
WBC Count: 6.1 10*3/uL (ref 4.0–10.5)
nRBC: 0 % (ref 0.0–0.2)

## 2019-05-16 MED ORDER — PROCHLORPERAZINE MALEATE 10 MG PO TABS
10.0000 mg | ORAL_TABLET | Freq: Once | ORAL | Status: AC
  Administered 2019-05-16: 10 mg via ORAL

## 2019-05-16 MED ORDER — BORTEZOMIB CHEMO SQ INJECTION 3.5 MG (2.5MG/ML)
1.3000 mg/m2 | Freq: Once | INTRAMUSCULAR | Status: AC
  Administered 2019-05-16: 3 mg via SUBCUTANEOUS
  Filled 2019-05-16: qty 1.2

## 2019-05-16 MED ORDER — DEXAMETHASONE 4 MG PO TABS: ORAL_TABLET | ORAL | 3 refills | Status: DC

## 2019-05-16 MED ORDER — LENALIDOMIDE 15 MG PO CAPS: 15.0000 mg | ORAL_CAPSULE | Freq: Every day | ORAL | 0 refills | Status: DC

## 2019-05-16 MED ORDER — PROCHLORPERAZINE MALEATE 10 MG PO TABS
ORAL_TABLET | ORAL | Status: AC
  Filled 2019-05-16: qty 1

## 2019-05-16 NOTE — Progress Notes (Signed)
[facility] OFFICE PROGRESS NOTE  Patient Care Team: Jesse Lung, MD as PCP - General (Family Medicine) Jesse Lark, MD as Consulting Physician (Hematology and Oncology)  ASSESSMENT & PLAN:  Multiple myeloma without remission [facility]) He tolerated treatment very well so far Repeat myeloma panel show excellent response to treatment with greater than partial response  clinically, he has near complete resolution of pain I recommend dexamethasone taper, written instruction given to the patient Per recommendation from [facility], we will switch his treatment to Revlimid days 1-21, and Velcade days 1, 8, 15 and rest days 21 for cycle of every 28 days Once he has completed dental work, we will proceed with Zometa next month He will continue calcium with vitamin D He will continue aspirin for DVT prophylaxis and calcium with vitamin D for bone health We discussed the role of bone marrow transplant He has appointment to return to [facility] in October, of which at that point in time, he would be at the end of cycle 4 of therapy  Cancer associated pain He has minimum bone pain He is attempting to stop taking pain medicine I will start dexamethasone taper slowly   No orders of the defined types were placed in this encounter.   INTERVAL HISTORY: Please see below for problem oriented charting. He returns for further follow-up He is doing very well He has minimum pain I have reviewed recommendation from [facility] He denies peripheral neuropathy He is still undergoing dental treatment  SUMMARY OF ONCOLOGIC HISTORY: Oncology History  Multiple myeloma without remission ([place])  03/20/2019 Imaging   1. Tumor involving the L5 and S1 and S2 segments of the spine as described with mass effects upon the left L4, L5, S1 and more distal left sacral nerves as described above. 2. Does the patient have a history of malignancy? 3. Multiple plasmacytomas and metastatic disease could  give this appearance   03/27/2019 Pathology Results   Soft Tissue Needle Core Biopsy, sacral mass - PLASMABLASTIC NEOPLASM. - SEE COMMENT. Microscopic Comment The sections show needle core biopsy fragments of soft tissue densely infiltrated by a relatively monomorphic infiltrate of atypical plasmacytoid cells characterized by vesicular or partially clumped chromatin and prominent nucleoli. This is associated with scattered mitosis. A battery of immunohistochemical stains was performed and show that the atypical plasmacytoid cells are positive for CD138, CD43 and cytoplasmic kappa. There is also weak positivity for CD56 and partial variable positivity for cyclin D1. The atypical plasmacytoid cells are negative for cytoplasmic lambda, CD10, PAX5, CD79a, CD20, CD3, CD5, CD34, EBV (ISH) and mostly negative for LCA. The overall morphologic and histologic features are most compatible with a plasmablastic neoplasm. The differential diagnosis includes plasmablastic lymphoma and plasmablastic plasmacytoma/myeloma. Based on the overall phenotypic features and the clinical setting, plasmacytoma/myeloma is favored. Clinical correlation and hematologic evaluation is recommended   03/27/2019 Procedure   Technically successful CT-guided biopsy of sacral soft tissue mass.   04/04/2019 Initial Diagnosis   Multiple myeloma without remission ([place])   04/11/2019 PET scan   IMPRESSION: 1. Previously noted expansile lesions involving the L5 vertebra and sacrum are again noted and exhibit intense FDG uptake compatible with metabolically active disease. A third focus of increased uptake without corresponding CT abnormality is noted within the intertrochanteric portions of the proximal right femur. 2. Small lucent lesion within the T3 vertebra is noted without corresponding FDG uptake. 3. Asymmetric left bladder wall thickening, etiology indeterminate. 4. Aortic Atherosclerosis (ICD10-I70.0). Lad coronary artery  calcification.  04/12/2019 Cancer Staging   Staging form: Plasma Cell Myeloma and Plasma Cell Disorders, AJCC 8th Edition - Clinical stage from 04/12/2019: Beta-2-microglobulin (mg/L): 2, Albumin (g/dL): 3.6, ISS: Stage I, High-risk cytogenetics: Unknown, LDH: Unknown - Signed by Jesse Lark, MD on 04/12/2019   04/14/2019 Bone Marrow Biopsy   Bone Marrow, Aspirate,Biopsy, and Clot, right iliac bone - PLASMA CELL MYELOMA.   04/17/2019 -  Chemotherapy   The patient had bortezomib SQ (VELCADE) chemo injection 3 mg, 1.3 mg/m2 = 3 mg, Subcutaneous,  Once, 2 of 4 cycles Administration: 3 mg (04/17/2019), 3 mg (04/25/2019), 3 mg (04/21/2019), 3 mg (04/28/2019), 3 mg (05/09/2019), 3 mg (05/16/2019), 3 mg (05/12/2019)  for chemotherapy treatment.      REVIEW OF SYSTEMS:   Constitutional: Denies fevers, chills or abnormal weight loss Eyes: Denies blurriness of vision Ears, nose, mouth, throat, and face: Denies mucositis or sore throat Respiratory: Denies cough, dyspnea or wheezes Cardiovascular: Denies palpitation, chest discomfort or lower extremity swelling Gastrointestinal:  Denies nausea, heartburn or change in bowel habits Skin: Denies abnormal skin rashes Lymphatics: Denies new lymphadenopathy or easy bruising Neurological:Denies numbness, tingling or new weaknesses Behavioral/Psych: Mood is stable, no new changes  All other systems were reviewed with the patient and are negative.  I have reviewed the past medical history, past surgical history, social history and family history with the patient and they are unchanged from previous note.  ALLERGIES:  has No Known Allergies.  MEDICATIONS:  Current Outpatient Medications  Medication Sig Dispense Refill  . acetaminophen (TYLENOL) 500 MG tablet Take 500 mg by mouth every 6 (six) hours as needed.    Marland Kitchen acyclovir (ZOVIRAX) 400 MG tablet Take 1 tablet (400 mg total) by mouth 2 (two) times daily. 180 tablet 11  . aspirin EC 81 MG tablet Take 81 mg by mouth  daily.    . calcium carbonate (TUMS - DOSED IN MG ELEMENTAL CALCIUM) 500 MG chewable tablet Chew 1 tablet by mouth 2 (two) times daily.    . cholecalciferol (VITAMIN D3) 25 MCG (1000 UT) tablet Take 2,000 Units by mouth daily.    Marland Kitchen dexamethasone (DECADRON) 4 MG tablet Weekly taper as instructed 16 tablet 3  . diclofenac sodium (VOLTAREN) 1 % GEL Apply 2 g topically 4 (four) times daily as needed.    Marland Kitchen HYDROcodone-acetaminophen (NORCO) 5-325 MG tablet Take 1 tablet by mouth every 6 (six) hours as needed for moderate pain. 60 tablet 0  . lenalidomide (REVLIMID) 15 MG capsule Take 1 capsule (15 mg total) by mouth daily. Take for 21 days on, 7 days off, repeat every 28 days. [employer] KCLE#[id number] 21 capsule 0  . lisinopril-hydrochlorothiazide (PRINZIDE,ZESTORETIC) 20-12.5 MG tablet TAKE 1 TABLET BY MOUTH ONCE DAILY 90 tablet 3  . ondansetron (ZOFRAN) 8 MG tablet Take 1 tablet (8 mg total) by mouth 2 (two) times daily as needed (Nausea or vomiting). 30 tablet 1  . prochlorperazine (COMPAZINE) 10 MG tablet Take 1 tablet (10 mg total) by mouth every 6 (six) hours as needed (Nausea or vomiting). 30 tablet 1   No current facility-administered medications for this visit.     PHYSICAL EXAMINATION: ECOG PERFORMANCE STATUS: 1 - Symptomatic but completely ambulatory  Vitals:   05/16/19 1334  BP: (!) 146/85  Pulse: 92  Resp: 18  Temp: 98.7 F (37.1 C)  SpO2: 97%   Filed Weights   05/16/19 1334  Weight: 229 lb 6.4 oz (104.1 kg)    GENERAL:alert, no distress and comfortable SKIN: skin color,  texture, turgor are normal, no rashes or significant lesions EYES: normal, Conjunctiva are pink and non-injected, sclera clear OROPHARYNX:no exudate, no erythema and lips, buccal mucosa, and tongue normal  NECK: supple, thyroid normal size, non-tender, without nodularity LYMPH:  no palpable lymphadenopathy in the cervical, axillary or inguinal LUNGS: clear to auscultation and percussion with normal breathing  effort HEART: regular rate & rhythm and no murmurs and no lower extremity edema ABDOMEN:abdomen soft, non-tender and normal bowel sounds Musculoskeletal:no cyanosis of digits and no clubbing  NEURO: alert & oriented x 3 with fluent speech, no focal motor/sensory deficits  LABORATORY DATA:  I have reviewed the data as listed    Component Value Date/Time   NA 134 (L) 05/16/2019 1313   NA 139 10/11/2018 1115   NA 139 04/05/2014 0849   K 4.3 05/16/2019 1313   K 4.4 04/05/2014 0849   CL 99 05/16/2019 1313   CO2 24 05/16/2019 1313   CO2 23 04/05/2014 0849   GLUCOSE 103 (H) 05/16/2019 1313   GLUCOSE 109 04/05/2014 0849   BUN 12 05/16/2019 1313   BUN 23 10/11/2018 1115   BUN 16.8 04/05/2014 0849   CREATININE 0.81 05/16/2019 1313   CREATININE 0.91 07/29/2017 1004   CREATININE 0.9 04/05/2014 0849   CALCIUM 9.4 05/16/2019 1313   CALCIUM 8.7 04/05/2014 0849   PROT 6.8 05/16/2019 1313   PROT 7.2 10/11/2018 1115   PROT 6.7 04/05/2014 0849   ALBUMIN 3.7 05/16/2019 1313   ALBUMIN 4.7 10/11/2018 1115   ALBUMIN 3.8 04/05/2014 0849   AST 22 05/16/2019 1313   AST 26 04/05/2014 0849   ALT 37 05/16/2019 1313   ALT 30 04/05/2014 0849   ALKPHOS 110 05/16/2019 1313   ALKPHOS 75 04/05/2014 0849   BILITOT 0.8 05/16/2019 1313   BILITOT 0.71 04/05/2014 0849   GFRNONAA >60 05/16/2019 1313   GFRAA >60 05/16/2019 1313    No results found for: SPEP, UPEP  Lab Results  Component Value Date   WBC 6.1 05/16/2019   NEUTROABS 5.0 05/16/2019   HGB 14.6 05/16/2019   HCT 42.5 05/16/2019   MCV 97.7 05/16/2019   PLT 251 05/16/2019      Chemistry      Component Value Date/Time   NA 134 (L) 05/16/2019 1313   NA 139 10/11/2018 1115   NA 139 04/05/2014 0849   K 4.3 05/16/2019 1313   K 4.4 04/05/2014 0849   CL 99 05/16/2019 1313   CO2 24 05/16/2019 1313   CO2 23 04/05/2014 0849   BUN 12 05/16/2019 1313   BUN 23 10/11/2018 1115   BUN 16.8 04/05/2014 0849   CREATININE 0.81 05/16/2019 1313    CREATININE 0.91 07/29/2017 1004   CREATININE 0.9 04/05/2014 0849   GLU 10 04/10/2014      Component Value Date/Time   CALCIUM 9.4 05/16/2019 1313   CALCIUM 8.7 04/05/2014 0849   ALKPHOS 110 05/16/2019 1313   ALKPHOS 75 04/05/2014 0849   AST 22 05/16/2019 1313   AST 26 04/05/2014 0849   ALT 37 05/16/2019 1313   ALT 30 04/05/2014 0849   BILITOT 0.8 05/16/2019 1313   BILITOT 0.71 04/05/2014 0849      All questions were answered. The patient knows to call the clinic with any problems, questions or concerns. No barriers to learning was detected.  I spent 15 minutes counseling the patient face to face. The total time spent in the appointment was 20 minutes and more than 50% was on counseling and  review of test results  Jesse Lark, MD 05/16/2019 5:48 PM

## 2019-05-16 NOTE — Assessment & Plan Note (Signed)
He has minimum bone pain He is attempting to stop taking pain medicine I will start dexamethasone taper slowly

## 2019-05-16 NOTE — Assessment & Plan Note (Signed)
He tolerated treatment very well so far Repeat myeloma panel show excellent response to treatment with greater than partial response  clinically, he has near complete resolution of pain I recommend dexamethasone taper, written instruction given to the patient Per recommendation from [facility], we will switch his treatment to Revlimid days 1-21, and Velcade days 1, 8, 15 and rest days 21 for cycle of every 28 days Once he has completed dental work, we will proceed with Zometa next month He will continue calcium with vitamin D He will continue aspirin for DVT prophylaxis and calcium with vitamin D for bone health We discussed the role of bone marrow transplant He has appointment to return to [facility] in October, of which at that point in time, he would be at the end of cycle 4 of therapy

## 2019-05-16 NOTE — Patient Instructions (Signed)
[facility] Discharge Instructions for Patients Receiving Chemotherapy  Today you received the following chemotherapy agents Velcade.  To help prevent nausea and vomiting after your treatment, we encourage you to take your nausea medication as directed.  If you develop nausea and vomiting that is not controlled by your nausea medication, call the clinic.   BELOW ARE SYMPTOMS THAT SHOULD BE REPORTED IMMEDIATELY:  *FEVER GREATER THAN 100.5 F  *CHILLS WITH OR WITHOUT FEVER  NAUSEA AND VOMITING THAT IS NOT CONTROLLED WITH YOUR NAUSEA MEDICATION  *UNUSUAL SHORTNESS OF BREATH  *UNUSUAL BRUISING OR BLEEDING  TENDERNESS IN MOUTH AND THROAT WITH OR WITHOUT PRESENCE OF ULCERS  *URINARY PROBLEMS  *BOWEL PROBLEMS  UNUSUAL RASH Items with * indicate a potential emergency and should be followed up as soon as possible.  Feel free to call the clinic should you have any questions or concerns. The clinic phone number is [phone].  Please show the CHEMO ALERT CARD at check-in to the Emergency Department and triage nurse.   

## 2019-05-17 ENCOUNTER — Telehealth: Payer: Self-pay | Admitting: Hematology and Oncology

## 2019-05-17 ENCOUNTER — Other Ambulatory Visit: Payer: Self-pay

## 2019-05-17 ENCOUNTER — Encounter: Payer: Self-pay | Admitting: Hematology and Oncology

## 2019-05-17 DIAGNOSIS — C9000 Multiple myeloma not having achieved remission: Secondary | ICD-10-CM

## 2019-05-17 MED ORDER — LENALIDOMIDE 15 MG PO CAPS: 15.0000 mg | ORAL_CAPSULE | Freq: Every day | ORAL | 0 refills | Status: DC

## 2019-05-17 NOTE — Telephone Encounter (Signed)
I talk with patient regarding schedule  

## 2019-05-18 ENCOUNTER — Other Ambulatory Visit: Payer: Self-pay

## 2019-05-18 MED ORDER — DEXAMETHASONE 4 MG PO TABS: ORAL_TABLET | ORAL | 3 refills | Status: DC

## 2019-05-18 NOTE — Telephone Encounter (Signed)
Dexamethasone prescription cancelled at [facility] and sent to [facility] per pt request.

## 2019-05-19 ENCOUNTER — Other Ambulatory Visit: Payer: Self-pay

## 2019-05-19 ENCOUNTER — Other Ambulatory Visit: Payer: Self-pay | Admitting: *Deleted

## 2019-05-19 ENCOUNTER — Inpatient Hospital Stay: Payer: Medicare Other

## 2019-05-19 VITALS — BP 117/63 | HR 89 | Temp 98.2°F | Resp 18

## 2019-05-19 DIAGNOSIS — C9000 Multiple myeloma not having achieved remission: Secondary | ICD-10-CM

## 2019-05-19 DIAGNOSIS — I2510 Atherosclerotic heart disease of native coronary artery without angina pectoris: Secondary | ICD-10-CM | POA: Diagnosis not present

## 2019-05-19 DIAGNOSIS — I700 Atherosclerosis of aorta: Secondary | ICD-10-CM | POA: Diagnosis not present

## 2019-05-19 DIAGNOSIS — G893 Neoplasm related pain (acute) (chronic): Secondary | ICD-10-CM | POA: Diagnosis not present

## 2019-05-19 DIAGNOSIS — Z5111 Encounter for antineoplastic chemotherapy: Secondary | ICD-10-CM | POA: Diagnosis not present

## 2019-05-19 DIAGNOSIS — L539 Erythematous condition, unspecified: Secondary | ICD-10-CM | POA: Diagnosis not present

## 2019-05-19 MED ORDER — BORTEZOMIB CHEMO SQ INJECTION 3.5 MG (2.5MG/ML)
1.3000 mg/m2 | Freq: Once | INTRAMUSCULAR | Status: AC
  Administered 2019-05-19: 3 mg via SUBCUTANEOUS
  Filled 2019-05-19: qty 1.2

## 2019-05-19 MED ORDER — DEXAMETHASONE 4 MG PO TABS: ORAL_TABLET | ORAL | 3 refills | Status: DC

## 2019-05-19 MED ORDER — PROCHLORPERAZINE MALEATE 10 MG PO TABS
ORAL_TABLET | ORAL | Status: AC
  Filled 2019-05-19: qty 1

## 2019-05-19 MED ORDER — PROCHLORPERAZINE MALEATE 10 MG PO TABS
10.0000 mg | ORAL_TABLET | Freq: Once | ORAL | Status: AC
  Administered 2019-05-19: 10 mg via ORAL

## 2019-05-19 MED ORDER — LENALIDOMIDE 15 MG PO CAPS: 15.0000 mg | ORAL_CAPSULE | Freq: Every day | ORAL | 0 refills | Status: DC

## 2019-05-19 NOTE — Patient Instructions (Signed)
[facility] Discharge Instructions for Patients Receiving Chemotherapy  Today you received the following chemotherapy agents Velcade.  To help prevent nausea and vomiting after your treatment, we encourage you to take your nausea medication as directed.  If you develop nausea and vomiting that is not controlled by your nausea medication, call the clinic.   BELOW ARE SYMPTOMS THAT SHOULD BE REPORTED IMMEDIATELY:  *FEVER GREATER THAN 100.5 F  *CHILLS WITH OR WITHOUT FEVER  NAUSEA AND VOMITING THAT IS NOT CONTROLLED WITH YOUR NAUSEA MEDICATION  *UNUSUAL SHORTNESS OF BREATH  *UNUSUAL BRUISING OR BLEEDING  TENDERNESS IN MOUTH AND THROAT WITH OR WITHOUT PRESENCE OF ULCERS  *URINARY PROBLEMS  *BOWEL PROBLEMS  UNUSUAL RASH Items with * indicate a potential emergency and should be followed up as soon as possible.  Feel free to call the clinic should you have any questions or concerns. The clinic phone number is [phone].  Please show the CHEMO ALERT CARD at check-in to the Emergency Department and triage nurse.   

## 2019-05-23 ENCOUNTER — Other Ambulatory Visit: Payer: Self-pay | Admitting: *Deleted

## 2019-05-23 DIAGNOSIS — C9000 Multiple myeloma not having achieved remission: Secondary | ICD-10-CM

## 2019-05-23 MED ORDER — LENALIDOMIDE 15 MG PO CAPS: 15.0000 mg | ORAL_CAPSULE | Freq: Every day | ORAL | 0 refills | Status: DC

## 2019-05-24 DIAGNOSIS — Z23 Encounter for immunization: Secondary | ICD-10-CM | POA: Diagnosis not present

## 2019-05-26 ENCOUNTER — Other Ambulatory Visit: Payer: Self-pay | Admitting: *Deleted

## 2019-05-26 DIAGNOSIS — C9000 Multiple myeloma not having achieved remission: Secondary | ICD-10-CM

## 2019-05-30 ENCOUNTER — Inpatient Hospital Stay: Payer: Medicare Other

## 2019-05-30 ENCOUNTER — Other Ambulatory Visit: Payer: Self-pay

## 2019-05-30 VITALS — BP 122/76 | HR 98 | Temp 98.0°F | Resp 18

## 2019-05-30 DIAGNOSIS — C9000 Multiple myeloma not having achieved remission: Secondary | ICD-10-CM | POA: Diagnosis not present

## 2019-05-30 DIAGNOSIS — I700 Atherosclerosis of aorta: Secondary | ICD-10-CM | POA: Diagnosis not present

## 2019-05-30 DIAGNOSIS — L539 Erythematous condition, unspecified: Secondary | ICD-10-CM | POA: Diagnosis not present

## 2019-05-30 DIAGNOSIS — I2510 Atherosclerotic heart disease of native coronary artery without angina pectoris: Secondary | ICD-10-CM | POA: Diagnosis not present

## 2019-05-30 DIAGNOSIS — Z5111 Encounter for antineoplastic chemotherapy: Secondary | ICD-10-CM | POA: Diagnosis not present

## 2019-05-30 DIAGNOSIS — G893 Neoplasm related pain (acute) (chronic): Secondary | ICD-10-CM | POA: Diagnosis not present

## 2019-05-30 LAB — CBC WITH DIFFERENTIAL (CANCER CENTER ONLY)
Abs Immature Granulocytes: 0.02 10*3/uL (ref 0.00–0.07)
Basophils Absolute: 0 10*3/uL (ref 0.0–0.1)
Basophils Relative: 0 %
Eosinophils Absolute: 0 10*3/uL (ref 0.0–0.5)
Eosinophils Relative: 0 %
HCT: 41.5 % (ref 39.0–52.0)
Hemoglobin: 14.2 g/dL (ref 13.0–17.0)
Immature Granulocytes: 0 %
Lymphocytes Relative: 10 %
Lymphs Abs: 0.6 10*3/uL — ABNORMAL LOW (ref 0.7–4.0)
MCH: 33.8 pg (ref 26.0–34.0)
MCHC: 34.2 g/dL (ref 30.0–36.0)
MCV: 98.8 fL (ref 80.0–100.0)
Monocytes Absolute: 0.2 10*3/uL (ref 0.1–1.0)
Monocytes Relative: 2 %
Neutro Abs: 5.4 10*3/uL (ref 1.7–7.7)
Neutrophils Relative %: 88 %
Platelet Count: 217 10*3/uL (ref 150–400)
RBC: 4.2 MIL/uL — ABNORMAL LOW (ref 4.22–5.81)
RDW: 13.5 % (ref 11.5–15.5)
WBC Count: 6.2 10*3/uL (ref 4.0–10.5)
nRBC: 0 % (ref 0.0–0.2)

## 2019-05-30 LAB — CMP (CANCER CENTER ONLY)
ALT: 43 U/L (ref 0–44)
AST: 20 U/L (ref 15–41)
Albumin: 3.6 g/dL (ref 3.5–5.0)
Alkaline Phosphatase: 92 U/L (ref 38–126)
Anion gap: 8 (ref 5–15)
BUN: 16 mg/dL (ref 8–23)
CO2: 25 mmol/L (ref 22–32)
Calcium: 8.7 mg/dL — ABNORMAL LOW (ref 8.9–10.3)
Chloride: 100 mmol/L (ref 98–111)
Creatinine: 0.91 mg/dL (ref 0.61–1.24)
GFR, Est AFR Am: >60 mL/min (ref >60)
GFR, Estimated: >60 mL/min (ref >60)
Glucose, Bld: 175 mg/dL — ABNORMAL HIGH (ref 70–99)
Potassium: 4.4 mmol/L (ref 3.5–5.1)
Sodium: 133 mmol/L — ABNORMAL LOW (ref 135–145)
Total Bilirubin: 1.2 mg/dL (ref 0.3–1.2)
Total Protein: 6.5 g/dL (ref 6.5–8.1)

## 2019-05-30 MED ORDER — BORTEZOMIB CHEMO SQ INJECTION 3.5 MG (2.5MG/ML)
1.3000 mg/m2 | Freq: Once | INTRAMUSCULAR | Status: AC
  Administered 2019-05-30: 3 mg via SUBCUTANEOUS
  Filled 2019-05-30: qty 1.2

## 2019-05-30 MED ORDER — PROCHLORPERAZINE MALEATE 10 MG PO TABS
ORAL_TABLET | ORAL | Status: AC
  Filled 2019-05-30: qty 1

## 2019-05-30 MED ORDER — PROCHLORPERAZINE MALEATE 10 MG PO TABS
10.0000 mg | ORAL_TABLET | Freq: Once | ORAL | Status: AC
  Administered 2019-05-30: 15:00:00 10 mg via ORAL

## 2019-05-30 NOTE — Patient Instructions (Signed)
[facility] Discharge Instructions for Patients Receiving Chemotherapy  Today you received the following chemotherapy agents Velcade.  To help prevent nausea and vomiting after your treatment, we encourage you to take your nausea medication as directed.  If you develop nausea and vomiting that is not controlled by your nausea medication, call the clinic.   BELOW ARE SYMPTOMS THAT SHOULD BE REPORTED IMMEDIATELY:  *FEVER GREATER THAN 100.5 F  *CHILLS WITH OR WITHOUT FEVER  NAUSEA AND VOMITING THAT IS NOT CONTROLLED WITH YOUR NAUSEA MEDICATION  *UNUSUAL SHORTNESS OF BREATH  *UNUSUAL BRUISING OR BLEEDING  TENDERNESS IN MOUTH AND THROAT WITH OR WITHOUT PRESENCE OF ULCERS  *URINARY PROBLEMS  *BOWEL PROBLEMS  UNUSUAL RASH Items with * indicate a potential emergency and should be followed up as soon as possible.  Feel free to call the clinic should you have any questions or concerns. The clinic phone number is [phone].  Please show the CHEMO ALERT CARD at check-in to the Emergency Department and triage nurse.   

## 2019-05-31 LAB — MULTIPLE MYELOMA PANEL, SERUM
Albumin SerPl Elph-Mcnc: 3.5 g/dL (ref 2.9–4.4)
Albumin/Glob SerPl: 1.5 (ref 0.7–1.7)
Alpha 1: 0.2 g/dL (ref 0.0–0.4)
Alpha2 Glob SerPl Elph-Mcnc: 0.7 g/dL (ref 0.4–1.0)
B-Globulin SerPl Elph-Mcnc: 0.9 g/dL (ref 0.7–1.3)
Gamma Glob SerPl Elph-Mcnc: 0.6 g/dL (ref 0.4–1.8)
Globulin, Total: 2.4 g/dL (ref 2.2–3.9)
IgA: 79 mg/dL (ref 61–437)
IgG (Immunoglobin G), Serum: 736 mg/dL (ref 603–1613)
IgM (Immunoglobulin M), Srm: 41 mg/dL (ref 20–172)
M Protein SerPl Elph-Mcnc: 0.3 g/dL — ABNORMAL HIGH
Total Protein ELP: 5.9 g/dL — ABNORMAL LOW (ref 6.0–8.5)

## 2019-05-31 LAB — KAPPA/LAMBDA LIGHT CHAINS
Kappa free light chain: 30.7 mg/L — ABNORMAL HIGH (ref 3.3–19.4)
Kappa, lambda light chain ratio: 2.27 — ABNORMAL HIGH (ref 0.26–1.65)
Lambda free light chains: 13.5 mg/L (ref 5.7–26.3)

## 2019-06-01 ENCOUNTER — Telehealth: Payer: Self-pay

## 2019-06-01 NOTE — Telephone Encounter (Signed)
Called and left below message. Ask him to call the office for questions. 

## 2019-06-01 NOTE — Telephone Encounter (Signed)
-----   Message from Heath Lark, MD sent at 06/01/2019  8:38 AM EDT ----- Regarding: myeloma panel looks good, pls let him know (IgG, M protein and light chains are all better)

## 2019-06-02 ENCOUNTER — Ambulatory Visit: Payer: Medicare Other

## 2019-06-06 ENCOUNTER — Inpatient Hospital Stay: Payer: Medicare Other

## 2019-06-06 ENCOUNTER — Other Ambulatory Visit: Payer: Self-pay

## 2019-06-06 ENCOUNTER — Inpatient Hospital Stay: Payer: Medicare Other | Attending: Hematology and Oncology

## 2019-06-06 VITALS — BP 113/72 | HR 92 | Temp 98.2°F | Resp 17 | Wt 225.2 lb

## 2019-06-06 DIAGNOSIS — Z79899 Other long term (current) drug therapy: Secondary | ICD-10-CM | POA: Diagnosis not present

## 2019-06-06 DIAGNOSIS — Z7982 Long term (current) use of aspirin: Secondary | ICD-10-CM | POA: Diagnosis not present

## 2019-06-06 DIAGNOSIS — R21 Rash and other nonspecific skin eruption: Secondary | ICD-10-CM | POA: Diagnosis not present

## 2019-06-06 DIAGNOSIS — G893 Neoplasm related pain (acute) (chronic): Secondary | ICD-10-CM | POA: Insufficient documentation

## 2019-06-06 DIAGNOSIS — C9000 Multiple myeloma not having achieved remission: Secondary | ICD-10-CM

## 2019-06-06 DIAGNOSIS — Z5111 Encounter for antineoplastic chemotherapy: Secondary | ICD-10-CM | POA: Diagnosis not present

## 2019-06-06 LAB — CMP (CANCER CENTER ONLY)
ALT: 35 U/L (ref 0–44)
AST: 19 U/L (ref 15–41)
Albumin: 3.8 g/dL (ref 3.5–5.0)
Alkaline Phosphatase: 80 U/L (ref 38–126)
Anion gap: 8 (ref 5–15)
BUN: 15 mg/dL (ref 8–23)
CO2: 25 mmol/L (ref 22–32)
Calcium: 8.7 mg/dL — ABNORMAL LOW (ref 8.9–10.3)
Chloride: 103 mmol/L (ref 98–111)
Creatinine: 0.9 mg/dL (ref 0.61–1.24)
GFR, Est AFR Am: >60 mL/min (ref >60)
GFR, Estimated: >60 mL/min (ref >60)
Glucose, Bld: 137 mg/dL — ABNORMAL HIGH (ref 70–99)
Potassium: 4.4 mmol/L (ref 3.5–5.1)
Sodium: 136 mmol/L (ref 135–145)
Total Bilirubin: 0.9 mg/dL (ref 0.3–1.2)
Total Protein: 6.5 g/dL (ref 6.5–8.1)

## 2019-06-06 LAB — CBC WITH DIFFERENTIAL (CANCER CENTER ONLY)
Abs Immature Granulocytes: 0.03 10*3/uL (ref 0.00–0.07)
Basophils Absolute: 0 10*3/uL (ref 0.0–0.1)
Basophils Relative: 0 %
Eosinophils Absolute: 0 10*3/uL (ref 0.0–0.5)
Eosinophils Relative: 0 %
HCT: 44.3 % (ref 39.0–52.0)
Hemoglobin: 14.8 g/dL (ref 13.0–17.0)
Immature Granulocytes: 1 %
Lymphocytes Relative: 15 %
Lymphs Abs: 1 10*3/uL (ref 0.7–4.0)
MCH: 33.1 pg (ref 26.0–34.0)
MCHC: 33.4 g/dL (ref 30.0–36.0)
MCV: 99.1 fL (ref 80.0–100.0)
Monocytes Absolute: 0.1 10*3/uL (ref 0.1–1.0)
Monocytes Relative: 2 %
Neutro Abs: 5.3 10*3/uL (ref 1.7–7.7)
Neutrophils Relative %: 82 %
Platelet Count: 205 10*3/uL (ref 150–400)
RBC: 4.47 MIL/uL (ref 4.22–5.81)
RDW: 13.5 % (ref 11.5–15.5)
WBC Count: 6.5 10*3/uL (ref 4.0–10.5)
nRBC: 0 % (ref 0.0–0.2)

## 2019-06-06 MED ORDER — PROCHLORPERAZINE MALEATE 10 MG PO TABS
ORAL_TABLET | ORAL | Status: AC
  Filled 2019-06-06: qty 1

## 2019-06-06 MED ORDER — PROCHLORPERAZINE MALEATE 10 MG PO TABS
10.0000 mg | ORAL_TABLET | Freq: Once | ORAL | Status: AC
  Administered 2019-06-06: 10 mg via ORAL

## 2019-06-06 MED ORDER — BORTEZOMIB CHEMO SQ INJECTION 3.5 MG (2.5MG/ML)
1.3000 mg/m2 | Freq: Once | INTRAMUSCULAR | Status: AC
  Administered 2019-06-06: 15:00:00 3 mg via SUBCUTANEOUS
  Filled 2019-06-06: qty 1.2

## 2019-06-06 NOTE — Patient Instructions (Signed)
[facility] Discharge Instructions for Patients Receiving Chemotherapy  Today you received the following chemotherapy agents: Bortezomib (Velcade)  To help prevent nausea and vomiting after your treatment, we encourage you to take your nausea medication as directed.   If you develop nausea and vomiting that is not controlled by your nausea medication, call the clinic.   BELOW ARE SYMPTOMS THAT SHOULD BE REPORTED IMMEDIATELY:  *FEVER GREATER THAN 100.5 F  *CHILLS WITH OR WITHOUT FEVER  NAUSEA AND VOMITING THAT IS NOT CONTROLLED WITH YOUR NAUSEA MEDICATION  *UNUSUAL SHORTNESS OF BREATH  *UNUSUAL BRUISING OR BLEEDING  TENDERNESS IN MOUTH AND THROAT WITH OR WITHOUT PRESENCE OF ULCERS  *URINARY PROBLEMS  *BOWEL PROBLEMS  UNUSUAL RASH Items with * indicate a potential emergency and should be followed up as soon as possible.  Feel free to call the clinic should you have any questions or concerns. The clinic phone number is [phone].  Please show the CHEMO ALERT CARD at check-in to the Emergency Department and triage nurse.  Coronavirus (COVID-19) Are you at risk?  Are you at risk for the Coronavirus (COVID-19)?  To be considered HIGH RISK for Coronavirus (COVID-19), you have to meet the following criteria:  . Traveled to [place], [place], [place], [place] or [place]; or in the [place] to [place], [place], [place], or [place]; and have fever, cough, and shortness of breath within the last 2 weeks of travel OR . Been in close contact with a person diagnosed with COVID-19 within the last 2 weeks and have fever, cough, and shortness of breath . IF YOU DO NOT MEET THESE CRITERIA, YOU ARE CONSIDERED LOW RISK FOR COVID-19.  What to do if you are HIGH RISK for COVID-19?  . If you are having a medical emergency, call 911. . Seek medical care right away. Before you go to a doctor's office, urgent care or emergency department, call ahead and tell them  about your recent travel, contact with someone diagnosed with COVID-19, and your symptoms. You should receive instructions from your physician's office regarding next steps of care.  . When you arrive at healthcare provider, tell the healthcare staff immediately you have returned from visiting [place], [place], [place], [place] or [place]; or traveled in the [place] to [place], [place], [place], or [place]; in the last two weeks or you have been in close contact with a person diagnosed with COVID-19 in the last 2 weeks.   . Tell the health care staff about your symptoms: fever, cough and shortness of breath. . After you have been seen by a medical provider, you will be either: o Tested for (COVID-19) and discharged home on quarantine except to seek medical care if symptoms worsen, and asked to  - Stay home and avoid contact with others until you get your results (4-5 days)  - Avoid travel on public transportation if possible (such as bus, train, or airplane) or o Sent to the Emergency Department by EMS for evaluation, COVID-19 testing, and possible admission depending on your condition and test results.  What to do if you are LOW RISK for COVID-19?  Reduce your risk of any infection by using the same precautions used for avoiding the common cold or flu:  . Wash your hands often with soap and warm water for at least 20 seconds.  If soap and water are not readily available, use an alcohol-based hand sanitizer with at least 60% alcohol.  . If coughing or   sneezing, cover your mouth and nose by coughing or sneezing into the elbow areas of your shirt or coat, into a tissue or into your sleeve (not your hands). . Avoid shaking hands with others and consider head nods or verbal greetings only. . Avoid touching your eyes, nose, or mouth with unwashed hands.  . Avoid close contact with people who are sick. . Avoid places or events with large numbers of people in one location, like concerts or  sporting events. . Carefully consider travel plans you have or are making. . If you are planning any travel outside or inside the [place], visit the CDC's Travelers' Health webpage for the latest health notices. . If you have some symptoms but not all symptoms, continue to monitor at home and seek medical attention if your symptoms worsen. . If you are having a medical emergency, call 911.   ADDITIONAL HEALTHCARE OPTIONS FOR PATIENTS  Register Telehealth / e-Visit: [email or web address]         [facility]: [phone]  [facility]: [phone]                   [facility]: [phone]   

## 2019-06-09 ENCOUNTER — Ambulatory Visit: Payer: Medicare Other

## 2019-06-14 ENCOUNTER — Encounter: Payer: Self-pay | Admitting: Hematology and Oncology

## 2019-06-14 ENCOUNTER — Inpatient Hospital Stay (HOSPITAL_BASED_OUTPATIENT_CLINIC_OR_DEPARTMENT_OTHER): Payer: Medicare Other | Admitting: Hematology and Oncology

## 2019-06-14 ENCOUNTER — Other Ambulatory Visit: Payer: Self-pay

## 2019-06-14 ENCOUNTER — Inpatient Hospital Stay: Payer: Medicare Other

## 2019-06-14 DIAGNOSIS — C9000 Multiple myeloma not having achieved remission: Secondary | ICD-10-CM

## 2019-06-14 DIAGNOSIS — Z5111 Encounter for antineoplastic chemotherapy: Secondary | ICD-10-CM | POA: Diagnosis not present

## 2019-06-14 DIAGNOSIS — Z7982 Long term (current) use of aspirin: Secondary | ICD-10-CM | POA: Diagnosis not present

## 2019-06-14 DIAGNOSIS — G893 Neoplasm related pain (acute) (chronic): Secondary | ICD-10-CM | POA: Diagnosis not present

## 2019-06-14 DIAGNOSIS — R21 Rash and other nonspecific skin eruption: Secondary | ICD-10-CM | POA: Diagnosis not present

## 2019-06-14 LAB — CMP (CANCER CENTER ONLY)
ALT: 31 U/L (ref 0–44)
AST: 18 U/L (ref 15–41)
Albumin: 3.8 g/dL (ref 3.5–5.0)
Alkaline Phosphatase: 67 U/L (ref 38–126)
Anion gap: 9 (ref 5–15)
BUN: 16 mg/dL (ref 8–23)
CO2: 24 mmol/L (ref 22–32)
Calcium: 8.7 mg/dL — ABNORMAL LOW (ref 8.9–10.3)
Chloride: 104 mmol/L (ref 98–111)
Creatinine: 0.84 mg/dL (ref 0.61–1.24)
GFR, Est AFR Am: >60 mL/min (ref >60)
GFR, Estimated: >60 mL/min (ref >60)
Glucose, Bld: 103 mg/dL — ABNORMAL HIGH (ref 70–99)
Potassium: 3.7 mmol/L (ref 3.5–5.1)
Sodium: 137 mmol/L (ref 135–145)
Total Bilirubin: 0.8 mg/dL (ref 0.3–1.2)
Total Protein: 6.3 g/dL — ABNORMAL LOW (ref 6.5–8.1)

## 2019-06-14 LAB — CBC WITH DIFFERENTIAL (CANCER CENTER ONLY)
Abs Immature Granulocytes: 0.04 10*3/uL (ref 0.00–0.07)
Basophils Absolute: 0 10*3/uL (ref 0.0–0.1)
Basophils Relative: 0 %
Eosinophils Absolute: 0.1 10*3/uL (ref 0.0–0.5)
Eosinophils Relative: 1 %
HCT: 41.8 % (ref 39.0–52.0)
Hemoglobin: 14.3 g/dL (ref 13.0–17.0)
Immature Granulocytes: 0 %
Lymphocytes Relative: 14 %
Lymphs Abs: 1.4 10*3/uL (ref 0.7–4.0)
MCH: 33.5 pg (ref 26.0–34.0)
MCHC: 34.2 g/dL (ref 30.0–36.0)
MCV: 97.9 fL (ref 80.0–100.0)
Monocytes Absolute: 1.6 10*3/uL — ABNORMAL HIGH (ref 0.1–1.0)
Monocytes Relative: 15 %
Neutro Abs: 7.4 10*3/uL (ref 1.7–7.7)
Neutrophils Relative %: 70 %
Platelet Count: 221 10*3/uL (ref 150–400)
RBC: 4.27 MIL/uL (ref 4.22–5.81)
RDW: 13.5 % (ref 11.5–15.5)
WBC Count: 10.5 10*3/uL (ref 4.0–10.5)
nRBC: 0 % (ref 0.0–0.2)

## 2019-06-14 MED ORDER — PROCHLORPERAZINE MALEATE 10 MG PO TABS
10.0000 mg | ORAL_TABLET | Freq: Once | ORAL | Status: AC
  Administered 2019-06-14: 10 mg via ORAL

## 2019-06-14 MED ORDER — BORTEZOMIB CHEMO SQ INJECTION 3.5 MG (2.5MG/ML)
1.3000 mg/m2 | Freq: Once | INTRAMUSCULAR | Status: AC
  Administered 2019-06-14: 3 mg via SUBCUTANEOUS
  Filled 2019-06-14: qty 1.2

## 2019-06-14 MED ORDER — PROCHLORPERAZINE MALEATE 10 MG PO TABS
ORAL_TABLET | ORAL | Status: AC
  Filled 2019-06-14: qty 1

## 2019-06-14 NOTE — Assessment & Plan Note (Signed)
He has minimum pain He has completed narcotic taper himself and will continue to take Tylenol as needed

## 2019-06-14 NOTE — Patient Instructions (Signed)
[facility] Discharge Instructions for Patients Receiving Chemotherapy  Today you received the following chemotherapy agent: Bortezomib (Velcade)  To help prevent nausea and vomiting after your treatment, we encourage you to take your nausea medication as directed.   If you develop nausea and vomiting that is not controlled by your nausea medication, call the clinic.   BELOW ARE SYMPTOMS THAT SHOULD BE REPORTED IMMEDIATELY:  *FEVER GREATER THAN 100.5 F  *CHILLS WITH OR WITHOUT FEVER  NAUSEA AND VOMITING THAT IS NOT CONTROLLED WITH YOUR NAUSEA MEDICATION  *UNUSUAL SHORTNESS OF BREATH  *UNUSUAL BRUISING OR BLEEDING  TENDERNESS IN MOUTH AND THROAT WITH OR WITHOUT PRESENCE OF ULCERS  *URINARY PROBLEMS  *BOWEL PROBLEMS  UNUSUAL RASH Items with * indicate a potential emergency and should be followed up as soon as possible.  Feel free to call the clinic should you have any questions or concerns. The clinic phone number is [phone].  Please show the CHEMO ALERT CARD at check-in to the Emergency Department and triage nurse.  

## 2019-06-14 NOTE — Progress Notes (Signed)
[facility] OFFICE PROGRESS NOTE  Patient Care Team: Denita Lung, MD as PCP - General (Family Medicine) Heath Lark, MD as Consulting Physician (Hematology and Oncology)  ASSESSMENT & PLAN:  Multiple myeloma without remission ([place]) He tolerated treatment very well so far Repeat myeloma panel show excellent response to treatment with VGPR  Clinically, he has near complete resolution of pain I recommend dexamethasone taper, written instruction given to the patient He will complete dexamethasone taper by the end of the month We discussed the risk and benefits of bisphosphonate therapy and he agreed to proceed. The patient prefer Xgeva instead and I will try to get insurance approval, to start in his next visit He will continue calcium with vitamin D He will continue aspirin for DVT prophylaxis and calcium with vitamin D for bone health We discussed the role of bone marrow transplant He has appointment to return to [facility] on 07/10/2019, of which at that point in time, he would be at the end of cycle 4 of therapy  Cancer associated pain He has minimum pain He has completed narcotic taper himself and will continue to take Tylenol as needed  Hypocalcemia He has minimum hypocalcemia He is advised to take vitamin D and calcium supplement for now   No orders of the defined types were placed in this encounter.   INTERVAL HISTORY: Please see below for problem oriented charting. He returns for further follow-up He feels well He had minimum side effects from treatment.  He has mild rash that is self-limiting after each injection His pain is minimum.  He has completed narcotic prescription taper Denies peripheral neuropathy No recent infection, fever or chills He has completed all dental work and had recent influenza vaccination  SUMMARY OF ONCOLOGIC HISTORY: Oncology History  Multiple myeloma without remission ([place])  03/20/2019 Imaging   1. Tumor involving the L5  and S1 and S2 segments of the spine as described with mass effects upon the left L4, L5, S1 and more distal left sacral nerves as described above. 2. Does the patient have a history of malignancy? 3. Multiple plasmacytomas and metastatic disease could give this appearance   03/27/2019 Pathology Results   Soft Tissue Needle Core Biopsy, sacral mass - PLASMABLASTIC NEOPLASM. - SEE COMMENT. Microscopic Comment The sections show needle core biopsy fragments of soft tissue densely infiltrated by a relatively monomorphic infiltrate of atypical plasmacytoid cells characterized by vesicular or partially clumped chromatin and prominent nucleoli. This is associated with scattered mitosis. A battery of immunohistochemical stains was performed and show that the atypical plasmacytoid cells are positive for CD138, CD43 and cytoplasmic kappa. There is also weak positivity for CD56 and partial variable positivity for cyclin D1. The atypical plasmacytoid cells are negative for cytoplasmic lambda, CD10, PAX5, CD79a, CD20, CD3, CD5, CD34, EBV (ISH) and mostly negative for LCA. The overall morphologic and histologic features are most compatible with a plasmablastic neoplasm. The differential diagnosis includes plasmablastic lymphoma and plasmablastic plasmacytoma/myeloma. Based on the overall phenotypic features and the clinical setting, plasmacytoma/myeloma is favored. Clinical correlation and hematologic evaluation is recommended   03/27/2019 Procedure   Technically successful CT-guided biopsy of sacral soft tissue mass.   04/04/2019 Initial Diagnosis   Multiple myeloma without remission ([place])   04/11/2019 PET scan   IMPRESSION: 1. Previously noted expansile lesions involving the L5 vertebra and sacrum are again noted and exhibit intense FDG uptake compatible with metabolically active disease. A third focus of increased uptake without corresponding CT abnormality is  noted within the intertrochanteric portions of the  proximal right femur. 2. Small lucent lesion within the T3 vertebra is noted without corresponding FDG uptake. 3. Asymmetric left bladder wall thickening, etiology indeterminate. 4. Aortic Atherosclerosis (ICD10-I70.0). Lad coronary artery calcification.   04/12/2019 Cancer Staging   Staging form: Plasma Cell Myeloma and Plasma Cell Disorders, AJCC 8th Edition - Clinical stage from 04/12/2019: Beta-2-microglobulin (mg/L): 2, Albumin (g/dL): 3.6, ISS: Stage I, High-risk cytogenetics: Unknown, LDH: Unknown - Signed by Heath Lark, MD on 04/12/2019   04/14/2019 Bone Marrow Biopsy   Bone Marrow, Aspirate,Biopsy, and Clot, right iliac bone - PLASMA CELL MYELOMA.   04/17/2019 -  Chemotherapy   The patient had bortezomib SQ (VELCADE) chemo injection 3 mg, 1.3 mg/m2 = 3 mg, Subcutaneous,  Once, 3 of 4 cycles Administration: 3 mg (04/17/2019), 3 mg (04/25/2019), 3 mg (04/21/2019), 3 mg (04/28/2019), 3 mg (05/09/2019), 3 mg (05/16/2019), 3 mg (05/12/2019), 3 mg (05/19/2019), 3 mg (05/30/2019), 3 mg (06/14/2019), 3 mg (06/06/2019)  for chemotherapy treatment.      REVIEW OF SYSTEMS:   Constitutional: Denies fevers, chills or abnormal weight loss Eyes: Denies blurriness of vision Ears, nose, mouth, throat, and face: Denies mucositis or sore throat Respiratory: Denies cough, dyspnea or wheezes Cardiovascular: Denies palpitation, chest discomfort or lower extremity swelling Gastrointestinal:  Denies nausea, heartburn or change in bowel habits Lymphatics: Denies new lymphadenopathy or easy bruising Neurological:Denies numbness, tingling or new weaknesses Behavioral/Psych: Mood is stable, no new changes  All other systems were reviewed with the patient and are negative.  I have reviewed the past medical history, past surgical history, social history and family history with the patient and they are unchanged from previous note.  ALLERGIES:  has No Known Allergies.  MEDICATIONS:  Current Outpatient Medications   Medication Sig Dispense Refill  . acetaminophen (TYLENOL) 500 MG tablet Take 500 mg by mouth every 6 (six) hours as needed.    Marland Kitchen acyclovir (ZOVIRAX) 400 MG tablet Take 1 tablet (400 mg total) by mouth 2 (two) times daily. 180 tablet 11  . aspirin EC 81 MG tablet Take 81 mg by mouth daily.    . calcium carbonate (TUMS - DOSED IN MG ELEMENTAL CALCIUM) 500 MG chewable tablet Chew 1 tablet by mouth 2 (two) times daily.    . cholecalciferol (VITAMIN D3) 25 MCG (1000 UT) tablet Take 2,000 Units by mouth daily.    Marland Kitchen dexamethasone (DECADRON) 4 MG tablet Weekly taper as instructed 16 tablet 3  . diclofenac sodium (VOLTAREN) 1 % GEL Apply 2 g topically 4 (four) times daily as needed.    Marland Kitchen HYDROcodone-acetaminophen (NORCO) 5-325 MG tablet Take 1 tablet by mouth every 6 (six) hours as needed for moderate pain. 60 tablet 0  . lenalidomide (REVLIMID) 15 MG capsule Take 1 capsule (15 mg total) by mouth daily. Take for 21 days on, 7 days off, repeat every 28 days. Celgene EZMO#[id number] 21 capsule 0  . lisinopril-hydrochlorothiazide (PRINZIDE,ZESTORETIC) 20-12.5 MG tablet TAKE 1 TABLET BY MOUTH ONCE DAILY 90 tablet 3  . ondansetron (ZOFRAN) 8 MG tablet Take 1 tablet (8 mg total) by mouth 2 (two) times daily as needed (Nausea or vomiting). 30 tablet 1  . prochlorperazine (COMPAZINE) 10 MG tablet Take 1 tablet (10 mg total) by mouth every 6 (six) hours as needed (Nausea or vomiting). 30 tablet 1   No current facility-administered medications for this visit.     PHYSICAL EXAMINATION: ECOG PERFORMANCE STATUS: 0 - Asymptomatic  Vitals:   06/14/19  1040  BP: (!) 151/78  Pulse: 84  Resp: 18  Temp: 98.2 F (36.8 C)  SpO2: 98%   Filed Weights   06/14/19 1040  Weight: 230 lb 3.2 oz (104.4 kg)    GENERAL:alert, no distress and comfortable SKIN: He has small skin rash at prior injection sites EYES: normal, Conjunctiva are pink and non-injected, sclera clear OROPHARYNX:no exudate, no erythema and lips, buccal  mucosa, and tongue normal  NECK: supple, thyroid normal size, non-tender, without nodularity LYMPH:  no palpable lymphadenopathy in the cervical, axillary or inguinal LUNGS: clear to auscultation and percussion with normal breathing effort HEART: regular rate & rhythm and no murmurs and no lower extremity edema ABDOMEN:abdomen soft, non-tender and normal bowel sounds Musculoskeletal:no cyanosis of digits and no clubbing  NEURO: alert & oriented x 3 with fluent speech, no focal motor/sensory deficits  LABORATORY DATA:  I have reviewed the data as listed    Component Value Date/Time   NA 137 06/14/2019 1005   NA 139 10/11/2018 1115   NA 139 04/05/2014 0849   K 3.7 06/14/2019 1005   K 4.4 04/05/2014 0849   CL 104 06/14/2019 1005   CO2 24 06/14/2019 1005   CO2 23 04/05/2014 0849   GLUCOSE 103 (H) 06/14/2019 1005   GLUCOSE 109 04/05/2014 0849   BUN 16 06/14/2019 1005   BUN 23 10/11/2018 1115   BUN 16.8 04/05/2014 0849   CREATININE 0.84 06/14/2019 1005   CREATININE 0.91 07/29/2017 1004   CREATININE 0.9 04/05/2014 0849   CALCIUM 8.7 (L) 06/14/2019 1005   CALCIUM 8.7 04/05/2014 0849   PROT 6.3 (L) 06/14/2019 1005   PROT 7.2 10/11/2018 1115   PROT 6.7 04/05/2014 0849   ALBUMIN 3.8 06/14/2019 1005   ALBUMIN 4.7 10/11/2018 1115   ALBUMIN 3.8 04/05/2014 0849   AST 18 06/14/2019 1005   AST 26 04/05/2014 0849   ALT 31 06/14/2019 1005   ALT 30 04/05/2014 0849   ALKPHOS 67 06/14/2019 1005   ALKPHOS 75 04/05/2014 0849   BILITOT 0.8 06/14/2019 1005   BILITOT 0.71 04/05/2014 0849   GFRNONAA >60 06/14/2019 1005   GFRAA >60 06/14/2019 1005    No results found for: SPEP, UPEP  Lab Results  Component Value Date   WBC 10.5 06/14/2019   NEUTROABS 7.4 06/14/2019   HGB 14.3 06/14/2019   HCT 41.8 06/14/2019   MCV 97.9 06/14/2019   PLT 221 06/14/2019      Chemistry      Component Value Date/Time   NA 137 06/14/2019 1005   NA 139 10/11/2018 1115   NA 139 04/05/2014 0849   K 3.7  06/14/2019 1005   K 4.4 04/05/2014 0849   CL 104 06/14/2019 1005   CO2 24 06/14/2019 1005   CO2 23 04/05/2014 0849   BUN 16 06/14/2019 1005   BUN 23 10/11/2018 1115   BUN 16.8 04/05/2014 0849   CREATININE 0.84 06/14/2019 1005   CREATININE 0.91 07/29/2017 1004   CREATININE 0.9 04/05/2014 0849   GLU 10 04/10/2014      Component Value Date/Time   CALCIUM 8.7 (L) 06/14/2019 1005   CALCIUM 8.7 04/05/2014 0849   ALKPHOS 67 06/14/2019 1005   ALKPHOS 75 04/05/2014 0849   AST 18 06/14/2019 1005   AST 26 04/05/2014 0849   ALT 31 06/14/2019 1005   ALT 30 04/05/2014 0849   BILITOT 0.8 06/14/2019 1005   BILITOT 0.71 04/05/2014 0849       All questions were answered. The  patient knows to call the clinic with any problems, questions or concerns. No barriers to learning was detected.  I spent 15 minutes counseling the patient face to face. The total time spent in the appointment was 20 minutes and more than 50% was on counseling and review of test results  Heath Lark, MD 06/14/2019 12:51 PM

## 2019-06-14 NOTE — Assessment & Plan Note (Signed)
He has minimum hypocalcemia He is advised to take vitamin D and calcium supplement for now

## 2019-06-14 NOTE — Assessment & Plan Note (Signed)
He tolerated treatment very well so far Repeat myeloma panel show excellent response to treatment with VGPR  Clinically, he has near complete resolution of pain I recommend dexamethasone taper, written instruction given to the patient He will complete dexamethasone taper by the end of the month We discussed the risk and benefits of bisphosphonate therapy and he agreed to proceed. The patient prefer Xgeva instead and I will try to get insurance approval, to start in his next visit He will continue calcium with vitamin D He will continue aspirin for DVT prophylaxis and calcium with vitamin D for bone health We discussed the role of bone marrow transplant He has appointment to return to [facility] on 07/10/2019, of which at that point in time, he would be at the end of cycle 4 of therapy

## 2019-06-20 ENCOUNTER — Ambulatory Visit: Payer: Medicare Other | Admitting: Hematology and Oncology

## 2019-06-20 ENCOUNTER — Other Ambulatory Visit: Payer: Medicare Other

## 2019-06-20 ENCOUNTER — Ambulatory Visit: Payer: Medicare Other

## 2019-06-21 ENCOUNTER — Other Ambulatory Visit: Payer: Self-pay | Admitting: *Deleted

## 2019-06-21 DIAGNOSIS — C9000 Multiple myeloma not having achieved remission: Secondary | ICD-10-CM

## 2019-06-21 MED ORDER — LENALIDOMIDE 15 MG PO CAPS: 15.0000 mg | ORAL_CAPSULE | Freq: Every day | ORAL | 0 refills | Status: DC

## 2019-06-23 ENCOUNTER — Other Ambulatory Visit: Payer: Self-pay | Admitting: Hematology and Oncology

## 2019-06-23 ENCOUNTER — Ambulatory Visit: Payer: Medicare Other

## 2019-06-27 ENCOUNTER — Inpatient Hospital Stay: Payer: Medicare Other

## 2019-06-27 ENCOUNTER — Other Ambulatory Visit: Payer: Self-pay

## 2019-06-27 VITALS — BP 147/80 | HR 100 | Temp 97.6°F | Resp 18

## 2019-06-27 DIAGNOSIS — R21 Rash and other nonspecific skin eruption: Secondary | ICD-10-CM | POA: Diagnosis not present

## 2019-06-27 DIAGNOSIS — C9000 Multiple myeloma not having achieved remission: Secondary | ICD-10-CM

## 2019-06-27 DIAGNOSIS — G893 Neoplasm related pain (acute) (chronic): Secondary | ICD-10-CM | POA: Diagnosis not present

## 2019-06-27 DIAGNOSIS — Z5111 Encounter for antineoplastic chemotherapy: Secondary | ICD-10-CM | POA: Diagnosis not present

## 2019-06-27 DIAGNOSIS — Z7982 Long term (current) use of aspirin: Secondary | ICD-10-CM | POA: Diagnosis not present

## 2019-06-27 LAB — CMP (CANCER CENTER ONLY)
ALT: 31 U/L (ref 0–44)
AST: 21 U/L (ref 15–41)
Albumin: 3.9 g/dL (ref 3.5–5.0)
Alkaline Phosphatase: 71 U/L (ref 38–126)
Anion gap: 11 (ref 5–15)
BUN: 16 mg/dL (ref 8–23)
CO2: 23 mmol/L (ref 22–32)
Calcium: 8.9 mg/dL (ref 8.9–10.3)
Chloride: 101 mmol/L (ref 98–111)
Creatinine: 0.89 mg/dL (ref 0.61–1.24)
GFR, Est AFR Am: >60 mL/min (ref >60)
GFR, Estimated: >60 mL/min (ref >60)
Glucose, Bld: 154 mg/dL — ABNORMAL HIGH (ref 70–99)
Potassium: 3.9 mmol/L (ref 3.5–5.1)
Sodium: 135 mmol/L (ref 135–145)
Total Bilirubin: 1 mg/dL (ref 0.3–1.2)
Total Protein: 6.7 g/dL (ref 6.5–8.1)

## 2019-06-27 LAB — CBC WITH DIFFERENTIAL (CANCER CENTER ONLY)
Abs Immature Granulocytes: 0.01 10*3/uL (ref 0.00–0.07)
Basophils Absolute: 0 10*3/uL (ref 0.0–0.1)
Basophils Relative: 0 %
Eosinophils Absolute: 0 10*3/uL (ref 0.0–0.5)
Eosinophils Relative: 0 %
HCT: 43.9 % (ref 39.0–52.0)
Hemoglobin: 15.1 g/dL (ref 13.0–17.0)
Immature Granulocytes: 0 %
Lymphocytes Relative: 13 %
Lymphs Abs: 0.8 10*3/uL (ref 0.7–4.0)
MCH: 33.6 pg (ref 26.0–34.0)
MCHC: 34.4 g/dL (ref 30.0–36.0)
MCV: 97.6 fL (ref 80.0–100.0)
Monocytes Absolute: 0.3 10*3/uL (ref 0.1–1.0)
Monocytes Relative: 4 %
Neutro Abs: 5 10*3/uL (ref 1.7–7.7)
Neutrophils Relative %: 83 %
Platelet Count: 253 10*3/uL (ref 150–400)
RBC: 4.5 MIL/uL (ref 4.22–5.81)
RDW: 13.6 % (ref 11.5–15.5)
WBC Count: 6.1 10*3/uL (ref 4.0–10.5)
nRBC: 0 % (ref 0.0–0.2)

## 2019-06-27 MED ORDER — DENOSUMAB 120 MG/1.7ML ~~LOC~~ SOLN
SUBCUTANEOUS | Status: AC
  Filled 2019-06-27: qty 1.7

## 2019-06-27 MED ORDER — PROCHLORPERAZINE MALEATE 10 MG PO TABS
ORAL_TABLET | ORAL | Status: AC
  Filled 2019-06-27: qty 1

## 2019-06-27 MED ORDER — BORTEZOMIB CHEMO SQ INJECTION 3.5 MG (2.5MG/ML)
1.3000 mg/m2 | Freq: Once | INTRAMUSCULAR | Status: AC
  Administered 2019-06-27: 3 mg via SUBCUTANEOUS
  Filled 2019-06-27: qty 1.2

## 2019-06-27 MED ORDER — PROCHLORPERAZINE MALEATE 10 MG PO TABS
10.0000 mg | ORAL_TABLET | Freq: Once | ORAL | Status: AC
  Administered 2019-06-27: 10 mg via ORAL

## 2019-06-27 MED ORDER — DENOSUMAB 120 MG/1.7ML ~~LOC~~ SOLN
120.0000 mg | Freq: Once | SUBCUTANEOUS | Status: AC
  Administered 2019-06-27: 16:00:00 120 mg via SUBCUTANEOUS

## 2019-06-28 LAB — KAPPA/LAMBDA LIGHT CHAINS
Kappa free light chain: 32.9 mg/L — ABNORMAL HIGH (ref 3.3–19.4)
Kappa, lambda light chain ratio: 2.06 — ABNORMAL HIGH (ref 0.26–1.65)
Lambda free light chains: 16 mg/L (ref 5.7–26.3)

## 2019-06-28 LAB — MULTIPLE MYELOMA PANEL, SERUM
Albumin SerPl Elph-Mcnc: 3.7 g/dL (ref 2.9–4.4)
Albumin/Glob SerPl: 1.6 (ref 0.7–1.7)
Alpha 1: 0.2 g/dL (ref 0.0–0.4)
Alpha2 Glob SerPl Elph-Mcnc: 0.8 g/dL (ref 0.4–1.0)
B-Globulin SerPl Elph-Mcnc: 0.8 g/dL (ref 0.7–1.3)
Gamma Glob SerPl Elph-Mcnc: 0.6 g/dL (ref 0.4–1.8)
Globulin, Total: 2.4 g/dL (ref 2.2–3.9)
IgA: 104 mg/dL (ref 61–437)
IgG (Immunoglobin G), Serum: 795 mg/dL (ref 603–1613)
IgM (Immunoglobulin M), Srm: 43 mg/dL (ref 20–172)
M Protein SerPl Elph-Mcnc: 0.2 g/dL — ABNORMAL HIGH
Total Protein ELP: 6.1 g/dL (ref 6.0–8.5)

## 2019-07-04 ENCOUNTER — Inpatient Hospital Stay: Payer: Medicare Other

## 2019-07-04 ENCOUNTER — Other Ambulatory Visit: Payer: Self-pay

## 2019-07-04 VITALS — BP 129/80 | HR 92 | Temp 98.3°F | Resp 16

## 2019-07-04 DIAGNOSIS — Z7982 Long term (current) use of aspirin: Secondary | ICD-10-CM | POA: Diagnosis not present

## 2019-07-04 DIAGNOSIS — C9000 Multiple myeloma not having achieved remission: Secondary | ICD-10-CM

## 2019-07-04 DIAGNOSIS — Z5111 Encounter for antineoplastic chemotherapy: Secondary | ICD-10-CM | POA: Diagnosis not present

## 2019-07-04 DIAGNOSIS — R21 Rash and other nonspecific skin eruption: Secondary | ICD-10-CM | POA: Diagnosis not present

## 2019-07-04 DIAGNOSIS — G893 Neoplasm related pain (acute) (chronic): Secondary | ICD-10-CM | POA: Diagnosis not present

## 2019-07-04 LAB — CMP (CANCER CENTER ONLY)
ALT: 33 U/L (ref 0–44)
AST: 23 U/L (ref 15–41)
Albumin: 3.8 g/dL (ref 3.5–5.0)
Alkaline Phosphatase: 65 U/L (ref 38–126)
Anion gap: 9 (ref 5–15)
BUN: 13 mg/dL (ref 8–23)
CO2: 26 mmol/L (ref 22–32)
Calcium: 7.7 mg/dL — ABNORMAL LOW (ref 8.9–10.3)
Chloride: 102 mmol/L (ref 98–111)
Creatinine: 0.81 mg/dL (ref 0.61–1.24)
GFR, Est AFR Am: >60 mL/min (ref >60)
GFR, Estimated: >60 mL/min (ref >60)
Glucose, Bld: 127 mg/dL — ABNORMAL HIGH (ref 70–99)
Potassium: 3.7 mmol/L (ref 3.5–5.1)
Sodium: 137 mmol/L (ref 135–145)
Total Bilirubin: 1.1 mg/dL (ref 0.3–1.2)
Total Protein: 6.2 g/dL — ABNORMAL LOW (ref 6.5–8.1)

## 2019-07-04 LAB — CBC WITH DIFFERENTIAL (CANCER CENTER ONLY)
Abs Immature Granulocytes: 0.01 10*3/uL (ref 0.00–0.07)
Basophils Absolute: 0.1 10*3/uL (ref 0.0–0.1)
Basophils Relative: 1 %
Eosinophils Absolute: 0.2 10*3/uL (ref 0.0–0.5)
Eosinophils Relative: 5 %
HCT: 42.1 % (ref 39.0–52.0)
Hemoglobin: 14.4 g/dL (ref 13.0–17.0)
Immature Granulocytes: 0 %
Lymphocytes Relative: 28 %
Lymphs Abs: 1.4 10*3/uL (ref 0.7–4.0)
MCH: 33.9 pg (ref 26.0–34.0)
MCHC: 34.2 g/dL (ref 30.0–36.0)
MCV: 99.1 fL (ref 80.0–100.0)
Monocytes Absolute: 0.5 10*3/uL (ref 0.1–1.0)
Monocytes Relative: 10 %
Neutro Abs: 2.7 10*3/uL (ref 1.7–7.7)
Neutrophils Relative %: 56 %
Platelet Count: 192 10*3/uL (ref 150–400)
RBC: 4.25 MIL/uL (ref 4.22–5.81)
RDW: 13.7 % (ref 11.5–15.5)
WBC Count: 4.9 10*3/uL (ref 4.0–10.5)
nRBC: 0 % (ref 0.0–0.2)

## 2019-07-04 MED ORDER — PROCHLORPERAZINE MALEATE 10 MG PO TABS
10.0000 mg | ORAL_TABLET | Freq: Once | ORAL | Status: AC
  Administered 2019-07-04: 10 mg via ORAL

## 2019-07-04 MED ORDER — BORTEZOMIB CHEMO SQ INJECTION 3.5 MG (2.5MG/ML)
1.3000 mg/m2 | Freq: Once | INTRAMUSCULAR | Status: AC
  Administered 2019-07-04: 3 mg via SUBCUTANEOUS
  Filled 2019-07-04: qty 1.2

## 2019-07-04 MED ORDER — PROCHLORPERAZINE MALEATE 10 MG PO TABS
ORAL_TABLET | ORAL | Status: AC
  Filled 2019-07-04: qty 1

## 2019-07-04 NOTE — Patient Instructions (Signed)
[facility] Discharge Instructions for Patients Receiving Chemotherapy  Today you received the following chemotherapy agents: Bortezomib (Velcade)  To help prevent nausea and vomiting after your treatment, we encourage you to take your nausea medication as directed.   If you develop nausea and vomiting that is not controlled by your nausea medication, call the clinic.   BELOW ARE SYMPTOMS THAT SHOULD BE REPORTED IMMEDIATELY:  *FEVER GREATER THAN 100.5 F  *CHILLS WITH OR WITHOUT FEVER  NAUSEA AND VOMITING THAT IS NOT CONTROLLED WITH YOUR NAUSEA MEDICATION  *UNUSUAL SHORTNESS OF BREATH  *UNUSUAL BRUISING OR BLEEDING  TENDERNESS IN MOUTH AND THROAT WITH OR WITHOUT PRESENCE OF ULCERS  *URINARY PROBLEMS  *BOWEL PROBLEMS  UNUSUAL RASH Items with * indicate a potential emergency and should be followed up as soon as possible.  Feel free to call the clinic should you have any questions or concerns. The clinic phone number is [phone].  Please show the CHEMO ALERT CARD at check-in to the Emergency Department and triage nurse.  Coronavirus (COVID-19) Are you at risk?  Are you at risk for the Coronavirus (COVID-19)?  To be considered HIGH RISK for Coronavirus (COVID-19), you have to meet the following criteria:  . Traveled to [place], [place], [place], [place] or [place]; or in the [place] to [place], [place], [place], or [place]; and have fever, cough, and shortness of breath within the last 2 weeks of travel OR . Been in close contact with a person diagnosed with COVID-19 within the last 2 weeks and have fever, cough, and shortness of breath . IF YOU DO NOT MEET THESE CRITERIA, YOU ARE CONSIDERED LOW RISK FOR COVID-19.  What to do if you are HIGH RISK for COVID-19?  . If you are having a medical emergency, call 911. . Seek medical care right away. Before you go to a doctor's office, urgent care or emergency department, call ahead and tell them  about your recent travel, contact with someone diagnosed with COVID-19, and your symptoms. You should receive instructions from your physician's office regarding next steps of care.  . When you arrive at healthcare provider, tell the healthcare staff immediately you have returned from visiting [place], [place], [place], [place] or [place]; or traveled in the [place] to [place], [place], [place], or [place]; in the last two weeks or you have been in close contact with a person diagnosed with COVID-19 in the last 2 weeks.   . Tell the health care staff about your symptoms: fever, cough and shortness of breath. . After you have been seen by a medical provider, you will be either: o Tested for (COVID-19) and discharged home on quarantine except to seek medical care if symptoms worsen, and asked to  - Stay home and avoid contact with others until you get your results (4-5 days)  - Avoid travel on public transportation if possible (such as bus, train, or airplane) or o Sent to the Emergency Department by EMS for evaluation, COVID-19 testing, and possible admission depending on your condition and test results.  What to do if you are LOW RISK for COVID-19?  Reduce your risk of any infection by using the same precautions used for avoiding the common cold or flu:  . Wash your hands often with soap and warm water for at least 20 seconds.  If soap and water are not readily available, use an alcohol-based hand sanitizer with at least 60% alcohol.  . If coughing or   sneezing, cover your mouth and nose by coughing or sneezing into the elbow areas of your shirt or coat, into a tissue or into your sleeve (not your hands). . Avoid shaking hands with others and consider head nods or verbal greetings only. . Avoid touching your eyes, nose, or mouth with unwashed hands.  . Avoid close contact with people who are sick. . Avoid places or events with large numbers of people in one location, like concerts or  sporting events. . Carefully consider travel plans you have or are making. . If you are planning any travel outside or inside the [place], visit the CDC's Travelers' Health webpage for the latest health notices. . If you have some symptoms but not all symptoms, continue to monitor at home and seek medical attention if your symptoms worsen. . If you are having a medical emergency, call 911.   ADDITIONAL HEALTHCARE OPTIONS FOR PATIENTS  [facility] / e-Visit: [email or web address]         [facility]: [phone]   Urgent Care: [phone]                   [facility]: [phone]   

## 2019-07-10 DIAGNOSIS — E538 Deficiency of other specified B group vitamins: Secondary | ICD-10-CM | POA: Diagnosis not present

## 2019-07-10 DIAGNOSIS — C9000 Multiple myeloma not having achieved remission: Secondary | ICD-10-CM | POA: Diagnosis not present

## 2019-07-10 DIAGNOSIS — Z79899 Other long term (current) drug therapy: Secondary | ICD-10-CM | POA: Diagnosis not present

## 2019-07-11 ENCOUNTER — Inpatient Hospital Stay: Payer: Medicare Other | Attending: Hematology and Oncology

## 2019-07-11 ENCOUNTER — Inpatient Hospital Stay: Payer: Medicare Other

## 2019-07-11 ENCOUNTER — Encounter: Payer: Self-pay | Admitting: Hematology and Oncology

## 2019-07-11 ENCOUNTER — Other Ambulatory Visit: Payer: Self-pay

## 2019-07-11 ENCOUNTER — Inpatient Hospital Stay (HOSPITAL_BASED_OUTPATIENT_CLINIC_OR_DEPARTMENT_OTHER): Payer: Medicare Other | Admitting: Hematology and Oncology

## 2019-07-11 DIAGNOSIS — Z79899 Other long term (current) drug therapy: Secondary | ICD-10-CM | POA: Insufficient documentation

## 2019-07-11 DIAGNOSIS — I700 Atherosclerosis of aorta: Secondary | ICD-10-CM | POA: Insufficient documentation

## 2019-07-11 DIAGNOSIS — G893 Neoplasm related pain (acute) (chronic): Secondary | ICD-10-CM | POA: Diagnosis not present

## 2019-07-11 DIAGNOSIS — C9000 Multiple myeloma not having achieved remission: Secondary | ICD-10-CM | POA: Insufficient documentation

## 2019-07-11 DIAGNOSIS — Z7982 Long term (current) use of aspirin: Secondary | ICD-10-CM | POA: Insufficient documentation

## 2019-07-11 DIAGNOSIS — Z5111 Encounter for antineoplastic chemotherapy: Secondary | ICD-10-CM | POA: Insufficient documentation

## 2019-07-11 DIAGNOSIS — I2510 Atherosclerotic heart disease of native coronary artery without angina pectoris: Secondary | ICD-10-CM | POA: Insufficient documentation

## 2019-07-11 LAB — CMP (CANCER CENTER ONLY)
ALT: 35 U/L (ref 0–44)
AST: 26 U/L (ref 15–41)
Albumin: 3.9 g/dL (ref 3.5–5.0)
Alkaline Phosphatase: 62 U/L (ref 38–126)
Anion gap: 9 (ref 5–15)
BUN: 18 mg/dL (ref 8–23)
CO2: 24 mmol/L (ref 22–32)
Calcium: 8 mg/dL — ABNORMAL LOW (ref 8.9–10.3)
Chloride: 101 mmol/L (ref 98–111)
Creatinine: 0.83 mg/dL (ref 0.61–1.24)
GFR, Est AFR Am: >60 mL/min (ref >60)
GFR, Estimated: >60 mL/min (ref >60)
Glucose, Bld: 103 mg/dL — ABNORMAL HIGH (ref 70–99)
Potassium: 3.8 mmol/L (ref 3.5–5.1)
Sodium: 134 mmol/L — ABNORMAL LOW (ref 135–145)
Total Bilirubin: 1.5 mg/dL — ABNORMAL HIGH (ref 0.3–1.2)
Total Protein: 6.4 g/dL — ABNORMAL LOW (ref 6.5–8.1)

## 2019-07-11 LAB — CBC WITH DIFFERENTIAL (CANCER CENTER ONLY)
Abs Immature Granulocytes: 0.01 10*3/uL (ref 0.00–0.07)
Basophils Absolute: 0.1 10*3/uL (ref 0.0–0.1)
Basophils Relative: 1 %
Eosinophils Absolute: 0.2 10*3/uL (ref 0.0–0.5)
Eosinophils Relative: 3 %
HCT: 42.4 % (ref 39.0–52.0)
Hemoglobin: 14.8 g/dL (ref 13.0–17.0)
Immature Granulocytes: 0 %
Lymphocytes Relative: 24 %
Lymphs Abs: 1.4 10*3/uL (ref 0.7–4.0)
MCH: 33.4 pg (ref 26.0–34.0)
MCHC: 34.9 g/dL (ref 30.0–36.0)
MCV: 95.7 fL (ref 80.0–100.0)
Monocytes Absolute: 1 10*3/uL (ref 0.1–1.0)
Monocytes Relative: 18 %
Neutro Abs: 3.1 10*3/uL (ref 1.7–7.7)
Neutrophils Relative %: 54 %
Platelet Count: 197 10*3/uL (ref 150–400)
RBC: 4.43 MIL/uL (ref 4.22–5.81)
RDW: 13.4 % (ref 11.5–15.5)
WBC Count: 5.8 10*3/uL (ref 4.0–10.5)
nRBC: 0 % (ref 0.0–0.2)

## 2019-07-11 MED ORDER — PROCHLORPERAZINE MALEATE 10 MG PO TABS
10.0000 mg | ORAL_TABLET | Freq: Once | ORAL | Status: AC
  Administered 2019-07-11: 10 mg via ORAL

## 2019-07-11 MED ORDER — PROCHLORPERAZINE MALEATE 10 MG PO TABS
ORAL_TABLET | ORAL | Status: AC
  Filled 2019-07-11: qty 1

## 2019-07-11 MED ORDER — BORTEZOMIB CHEMO SQ INJECTION 3.5 MG (2.5MG/ML)
1.3000 mg/m2 | Freq: Once | INTRAMUSCULAR | Status: AC
  Administered 2019-07-11: 14:00:00 3 mg via SUBCUTANEOUS
  Filled 2019-07-11: qty 1.2

## 2019-07-11 MED ORDER — HYDROCODONE-ACETAMINOPHEN 5-325 MG PO TABS: 1.0000 | ORAL_TABLET | Freq: Four times a day (QID) | ORAL | 0 refills | Status: DC | PRN

## 2019-07-11 NOTE — Patient Instructions (Signed)
[facility] Discharge Instructions for Patients Receiving Chemotherapy  Today you received the following chemotherapy agent:  Velcade.  To help prevent nausea and vomiting after your treatment, we encourage you to take your nausea medication as directed.   If you develop nausea and vomiting that is not controlled by your nausea medication, call the clinic.   BELOW ARE SYMPTOMS THAT SHOULD BE REPORTED IMMEDIATELY:  *FEVER GREATER THAN 100.5 F  *CHILLS WITH OR WITHOUT FEVER  NAUSEA AND VOMITING THAT IS NOT CONTROLLED WITH YOUR NAUSEA MEDICATION  *UNUSUAL SHORTNESS OF BREATH  *UNUSUAL BRUISING OR BLEEDING  TENDERNESS IN MOUTH AND THROAT WITH OR WITHOUT PRESENCE OF ULCERS  *URINARY PROBLEMS  *BOWEL PROBLEMS  UNUSUAL RASH Items with * indicate a potential emergency and should be followed up as soon as possible.  Feel free to call the clinic should you have any questions or concerns. The clinic phone number is [phone].  Please show the CHEMO ALERT CARD at check-in to the Emergency Department and triage nurse.   

## 2019-07-11 NOTE — Assessment & Plan Note (Signed)
He continues to respond favorably to treatment His last dose of chemotherapy is this week He is scheduled to return to [facility] on October 14 for pretransplant evaluation and subsequent autologous stem cell transplant He will continue acyclovir for antimicrobial prophylaxis We will hold off future refill of Revlimid I will see him back in the future once he is ready to resume care locally for maintenance treatment

## 2019-07-11 NOTE — Progress Notes (Signed)
[place] OFFICE PROGRESS NOTE  Patient Care Team: Denita Lung, MD as PCP - General (Family Medicine) Heath Lark, MD as Consulting Physician (Hematology and Oncology)  ASSESSMENT & PLAN:  Multiple myeloma without remission [facility]) He continues to respond favorably to treatment His last dose of chemotherapy is this week He is scheduled to return to [facility] on October 14 for pretransplant evaluation and subsequent autologous stem cell transplant He will continue acyclovir for antimicrobial prophylaxis We will hold off future refill of Revlimid I will see him back in the future once he is ready to resume care locally for maintenance treatment  Cancer associated pain He has minimum cancer associated pain I refilled his prescription hydrocodone today to take as needed   No orders of the defined types were placed in this encounter.   INTERVAL HISTORY: Please see below for problem oriented charting. He returns for further follow-up I have reviewed extensive documentation from [facility] He is doing well No peripheral neuropathy Denies recent nausea or changes in bowel habits He has minimum back pain that comes and goes No recent infection, fever or chills  SUMMARY OF ONCOLOGIC HISTORY: Oncology History  Multiple myeloma without remission ([place])  03/20/2019 Imaging   1. Tumor involving the L5 and S1 and S2 segments of the spine as described with mass effects upon the left L4, L5, S1 and more distal left sacral nerves as described above. 2. Does the patient have a history of malignancy? 3. Multiple plasmacytomas and metastatic disease could give this appearance   03/27/2019 Pathology Results   Soft Tissue Needle Core Biopsy, sacral mass - PLASMABLASTIC NEOPLASM. - SEE COMMENT. Microscopic Comment The sections show needle core biopsy fragments of soft tissue densely infiltrated by a relatively monomorphic  infiltrate of atypical plasmacytoid cells characterized by vesicular or partially clumped chromatin and prominent nucleoli. This is associated with scattered mitosis. A battery of immunohistochemical stains was performed and show that the atypical plasmacytoid cells are positive for CD138, CD43 and cytoplasmic kappa. There is also weak positivity for CD56 and partial variable positivity for cyclin D1. The atypical plasmacytoid cells are negative for cytoplasmic lambda, CD10, PAX5, CD79a, CD20, CD3, CD5, CD34, EBV (ISH) and mostly negative for LCA. The overall morphologic and histologic features are most compatible with a plasmablastic neoplasm. The differential diagnosis includes plasmablastic lymphoma and plasmablastic plasmacytoma/myeloma. Based on the overall phenotypic features and the clinical setting, plasmacytoma/myeloma is favored. Clinical correlation and hematologic evaluation is recommended   03/27/2019 Procedure   Technically successful CT-guided biopsy of sacral soft tissue mass.   04/04/2019 Initial Diagnosis   Multiple myeloma without remission ([place])   04/11/2019 PET scan   IMPRESSION: 1. Previously noted expansile lesions involving the L5 vertebra and sacrum are again noted and exhibit intense FDG uptake compatible with metabolically active disease. A third focus of increased uptake without corresponding CT abnormality is noted within the intertrochanteric portions of the proximal right femur. 2. Small lucent lesion within the T3 vertebra is noted without corresponding FDG uptake. 3. Asymmetric left bladder wall thickening, etiology indeterminate. 4. Aortic Atherosclerosis (ICD10-I70.0). Lad coronary artery calcification.   04/12/2019 Cancer Staging   Staging form: Plasma Cell Myeloma and Plasma Cell Disorders, AJCC 8th Edition - Clinical stage from 04/12/2019: Beta-2-microglobulin (mg/L): 2, Albumin (g/dL): 3.6, ISS: Stage I, High-risk cytogenetics: Unknown, LDH: Unknown - Signed by  Heath Lark, MD on 04/12/2019   04/14/2019 Bone Marrow Biopsy   Bone Marrow,  Aspirate,Biopsy, and Clot, right iliac bone - PLASMA CELL MYELOMA.   04/17/2019 -  Chemotherapy   The patient had bortezomib SQ (VELCADE) chemo injection 3 mg, 1.3 mg/m2 = 3 mg, Subcutaneous,  Once, 4 of 4 cycles Administration: 3 mg (04/17/2019), 3 mg (04/25/2019), 3 mg (04/21/2019), 3 mg (04/28/2019), 3 mg (05/09/2019), 3 mg (05/16/2019), 3 mg (05/12/2019), 3 mg (05/19/2019), 3 mg (05/30/2019), 3 mg (06/14/2019), 3 mg (06/06/2019), 3 mg (06/27/2019), 3 mg (07/04/2019)  for chemotherapy treatment.      REVIEW OF SYSTEMS:   Constitutional: Denies fevers, chills or abnormal weight loss Eyes: Denies blurriness of vision Ears, nose, mouth, throat, and face: Denies mucositis or sore throat Respiratory: Denies cough, dyspnea or wheezes Cardiovascular: Denies palpitation, chest discomfort or lower extremity swelling Gastrointestinal:  Denies nausea, heartburn or change in bowel habits Skin: Denies abnormal skin rashes Lymphatics: Denies new lymphadenopathy or easy bruising Neurological:Denies numbness, tingling or new weaknesses Behavioral/Psych: Mood is stable, no new changes  All other systems were reviewed with the patient and are negative.  I have reviewed the past medical history, past surgical history, social history and family history with the patient and they are unchanged from previous note.  ALLERGIES:  has No Known Allergies.  MEDICATIONS:  Current Outpatient Medications  Medication Sig Dispense Refill  . acetaminophen (TYLENOL) 500 MG tablet Take 500 mg by mouth every 6 (six) hours as needed.    Marland Kitchen acyclovir (ZOVIRAX) 400 MG tablet Take 1 tablet (400 mg total) by mouth 2 (two) times daily. 180 tablet 11  . aspirin EC 81 MG tablet Take 81 mg by mouth daily.    . calcium carbonate (TUMS - DOSED IN MG ELEMENTAL CALCIUM) 500 MG chewable tablet Chew 1 tablet by mouth 2 (two) times daily.    . cholecalciferol (VITAMIN D3) 25  MCG (1000 UT) tablet Take 2,000 Units by mouth daily.    . diclofenac sodium (VOLTAREN) 1 % GEL Apply 2 g topically 4 (four) times daily as needed.    Marland Kitchen HYDROcodone-acetaminophen (NORCO) 5-325 MG tablet Take 1 tablet by mouth every 6 (six) hours as needed for moderate pain. 60 tablet 0  . lenalidomide (REVLIMID) 15 MG capsule Take 1 capsule (15 mg total) by mouth daily. Take for 21 days on, 7 days off, repeat every 28 days. Celgene [id number] 21 capsule 0  . lisinopril-hydrochlorothiazide (PRINZIDE,ZESTORETIC) 20-12.5 MG tablet TAKE 1 TABLET BY MOUTH ONCE DAILY 90 tablet 3  . ondansetron (ZOFRAN) 8 MG tablet Take 1 tablet (8 mg total) by mouth 2 (two) times daily as needed (Nausea or vomiting). 30 tablet 1  . prochlorperazine (COMPAZINE) 10 MG tablet Take 1 tablet (10 mg total) by mouth every 6 (six) hours as needed (Nausea or vomiting). 30 tablet 1   No current facility-administered medications for this visit.    Facility-Administered Medications Ordered in Other Visits  Medication Dose Route Frequency Provider Last Rate Last Dose  . bortezomib SQ (VELCADE) chemo injection 3 mg  1.3 mg/m2 (Treatment Plan Recorded) Subcutaneous Once Heath Lark, MD        PHYSICAL EXAMINATION: ECOG PERFORMANCE STATUS: 1 - Symptomatic but completely ambulatory  Vitals:   07/11/19 1244  BP: 119/75  Pulse: 85  Resp: 16  Temp: 98 F (36.7 C)  SpO2: 97%   Filed Weights   07/11/19 1244  Weight: 229 lb 3.2 oz (104 kg)    GENERAL:alert, no distress and comfortable SKIN: skin color, texture, turgor are normal, no rashes or significant lesions  EYES: normal, Conjunctiva are pink and non-injected, sclera clear OROPHARYNX:no exudate, no erythema and lips, buccal mucosa, and tongue normal  NECK: supple, thyroid normal size, non-tender, without nodularity LYMPH:  no palpable lymphadenopathy in the cervical, axillary or inguinal LUNGS: clear to auscultation and percussion with normal breathing effort HEART:  regular rate & rhythm and no murmurs and no lower extremity edema ABDOMEN:abdomen soft, non-tender and normal bowel sounds Musculoskeletal:no cyanosis of digits and no clubbing  NEURO: alert & oriented x 3 with fluent speech, no focal motor/sensory deficits  LABORATORY DATA:  I have reviewed the data as listed    Component Value Date/Time   NA 134 (L) 07/11/2019 1214   NA 139 10/11/2018 1115   NA 139 04/05/2014 0849   K 3.8 07/11/2019 1214   K 4.4 04/05/2014 0849   CL 101 07/11/2019 1214   CO2 24 07/11/2019 1214   CO2 23 04/05/2014 0849   GLUCOSE 103 (H) 07/11/2019 1214   GLUCOSE 109 04/05/2014 0849   BUN 18 07/11/2019 1214   BUN 23 10/11/2018 1115   BUN 16.8 04/05/2014 0849   CREATININE 0.83 07/11/2019 1214   CREATININE 0.91 07/29/2017 1004   CREATININE 0.9 04/05/2014 0849   CALCIUM 8.0 (L) 07/11/2019 1214   CALCIUM 8.7 04/05/2014 0849   PROT 6.4 (L) 07/11/2019 1214   PROT 7.2 10/11/2018 1115   PROT 6.7 04/05/2014 0849   ALBUMIN 3.9 07/11/2019 1214   ALBUMIN 4.7 10/11/2018 1115   ALBUMIN 3.8 04/05/2014 0849   AST 26 07/11/2019 1214   AST 26 04/05/2014 0849   ALT 35 07/11/2019 1214   ALT 30 04/05/2014 0849   ALKPHOS 62 07/11/2019 1214   ALKPHOS 75 04/05/2014 0849   BILITOT 1.5 (H) 07/11/2019 1214   BILITOT 0.71 04/05/2014 0849   GFRNONAA >60 07/11/2019 1214   GFRAA >60 07/11/2019 1214    No results found for: SPEP, UPEP  Lab Results  Component Value Date   WBC 5.8 07/11/2019   NEUTROABS 3.1 07/11/2019   HGB 14.8 07/11/2019   HCT 42.4 07/11/2019   MCV 95.7 07/11/2019   PLT 197 07/11/2019      Chemistry      Component Value Date/Time   NA 134 (L) 07/11/2019 1214   NA 139 10/11/2018 1115   NA 139 04/05/2014 0849   K 3.8 07/11/2019 1214   K 4.4 04/05/2014 0849   CL 101 07/11/2019 1214   CO2 24 07/11/2019 1214   CO2 23 04/05/2014 0849   BUN 18 07/11/2019 1214   BUN 23 10/11/2018 1115   BUN 16.8 04/05/2014 0849   CREATININE 0.83 07/11/2019 1214    CREATININE 0.91 07/29/2017 1004   CREATININE 0.9 04/05/2014 0849   GLU 10 04/10/2014      Component Value Date/Time   CALCIUM 8.0 (L) 07/11/2019 1214   CALCIUM 8.7 04/05/2014 0849   ALKPHOS 62 07/11/2019 1214   ALKPHOS 75 04/05/2014 0849   AST 26 07/11/2019 1214   AST 26 04/05/2014 0849   ALT 35 07/11/2019 1214   ALT 30 04/05/2014 0849   BILITOT 1.5 (H) 07/11/2019 1214   BILITOT 0.71 04/05/2014 0849       All questions were answered. The patient knows to call the clinic with any problems, questions or concerns. No barriers to learning was detected.  I spent 15 minutes counseling the patient face to face. The total time spent in the appointment was 20 minutes and more than 50% was on counseling and review of test  results  Heath Lark, MD 07/11/2019 1:47 PM

## 2019-07-11 NOTE — Assessment & Plan Note (Signed)
He has minimum cancer associated pain I refilled his prescription hydrocodone today to take as needed

## 2019-07-12 DIAGNOSIS — Z20828 Contact with and (suspected) exposure to other viral communicable diseases: Secondary | ICD-10-CM | POA: Diagnosis not present

## 2019-07-12 DIAGNOSIS — Z01812 Encounter for preprocedural laboratory examination: Secondary | ICD-10-CM | POA: Diagnosis not present

## 2019-07-12 DIAGNOSIS — C9000 Multiple myeloma not having achieved remission: Secondary | ICD-10-CM | POA: Diagnosis not present

## 2019-07-12 LAB — KAPPA/LAMBDA LIGHT CHAINS
Kappa free light chain: 39.8 mg/L — ABNORMAL HIGH (ref 3.3–19.4)
Kappa, lambda light chain ratio: 2.33 — ABNORMAL HIGH (ref 0.26–1.65)
Lambda free light chains: 17.1 mg/L (ref 5.7–26.3)

## 2019-07-13 LAB — MULTIPLE MYELOMA PANEL, SERUM
Albumin SerPl Elph-Mcnc: 3.6 g/dL (ref 2.9–4.4)
Albumin/Glob SerPl: 1.6 (ref 0.7–1.7)
Alpha 1: 0.2 g/dL (ref 0.0–0.4)
Alpha2 Glob SerPl Elph-Mcnc: 0.7 g/dL (ref 0.4–1.0)
B-Globulin SerPl Elph-Mcnc: 0.8 g/dL (ref 0.7–1.3)
Gamma Glob SerPl Elph-Mcnc: 0.7 g/dL (ref 0.4–1.8)
Globulin, Total: 2.3 g/dL (ref 2.2–3.9)
IgA: 106 mg/dL (ref 61–437)
IgG (Immunoglobin G), Serum: 834 mg/dL (ref 603–1613)
IgM (Immunoglobulin M), Srm: 32 mg/dL (ref 20–172)
M Protein SerPl Elph-Mcnc: 0.3 g/dL — ABNORMAL HIGH
Total Protein ELP: 5.9 g/dL — ABNORMAL LOW (ref 6.0–8.5)

## 2019-07-19 DIAGNOSIS — R0689 Other abnormalities of breathing: Secondary | ICD-10-CM | POA: Diagnosis not present

## 2019-07-19 DIAGNOSIS — R946 Abnormal results of thyroid function studies: Secondary | ICD-10-CM | POA: Diagnosis not present

## 2019-07-19 DIAGNOSIS — I517 Cardiomegaly: Secondary | ICD-10-CM | POA: Diagnosis not present

## 2019-07-19 DIAGNOSIS — C9000 Multiple myeloma not having achieved remission: Secondary | ICD-10-CM | POA: Diagnosis not present

## 2019-07-19 DIAGNOSIS — Z1159 Encounter for screening for other viral diseases: Secondary | ICD-10-CM | POA: Diagnosis not present

## 2019-07-19 DIAGNOSIS — Z01818 Encounter for other preprocedural examination: Secondary | ICD-10-CM | POA: Diagnosis not present

## 2019-07-19 DIAGNOSIS — Z9481 Bone marrow transplant status: Secondary | ICD-10-CM | POA: Diagnosis not present

## 2019-07-19 DIAGNOSIS — I10 Essential (primary) hypertension: Secondary | ICD-10-CM | POA: Diagnosis not present

## 2019-07-20 DIAGNOSIS — H04123 Dry eye syndrome of bilateral lacrimal glands: Secondary | ICD-10-CM | POA: Diagnosis not present

## 2019-07-20 DIAGNOSIS — H43813 Vitreous degeneration, bilateral: Secondary | ICD-10-CM | POA: Diagnosis not present

## 2019-07-20 DIAGNOSIS — H35371 Puckering of macula, right eye: Secondary | ICD-10-CM | POA: Diagnosis not present

## 2019-07-25 DIAGNOSIS — C9000 Multiple myeloma not having achieved remission: Secondary | ICD-10-CM | POA: Diagnosis not present

## 2019-07-26 DIAGNOSIS — C9000 Multiple myeloma not having achieved remission: Secondary | ICD-10-CM | POA: Diagnosis not present

## 2019-07-26 DIAGNOSIS — Z7982 Long term (current) use of aspirin: Secondary | ICD-10-CM | POA: Diagnosis not present

## 2019-07-26 DIAGNOSIS — E538 Deficiency of other specified B group vitamins: Secondary | ICD-10-CM | POA: Diagnosis not present

## 2019-08-05 DIAGNOSIS — C9000 Multiple myeloma not having achieved remission: Secondary | ICD-10-CM | POA: Diagnosis not present

## 2019-08-05 DIAGNOSIS — Z52091 Other blood donor, stem cells: Secondary | ICD-10-CM | POA: Diagnosis not present

## 2019-08-06 DIAGNOSIS — C9000 Multiple myeloma not having achieved remission: Secondary | ICD-10-CM | POA: Diagnosis not present

## 2019-08-06 DIAGNOSIS — Z79899 Other long term (current) drug therapy: Secondary | ICD-10-CM | POA: Diagnosis not present

## 2019-08-07 DIAGNOSIS — Z52011 Autologous donor, stem cells: Secondary | ICD-10-CM | POA: Diagnosis not present

## 2019-08-07 DIAGNOSIS — C9000 Multiple myeloma not having achieved remission: Secondary | ICD-10-CM | POA: Diagnosis not present

## 2019-08-08 DIAGNOSIS — Z52011 Autologous donor, stem cells: Secondary | ICD-10-CM | POA: Diagnosis not present

## 2019-08-08 DIAGNOSIS — C9000 Multiple myeloma not having achieved remission: Secondary | ICD-10-CM | POA: Diagnosis not present

## 2019-08-09 DIAGNOSIS — C9000 Multiple myeloma not having achieved remission: Secondary | ICD-10-CM | POA: Diagnosis not present

## 2019-08-09 DIAGNOSIS — Z52011 Autologous donor, stem cells: Secondary | ICD-10-CM | POA: Diagnosis not present

## 2019-08-10 DIAGNOSIS — C9000 Multiple myeloma not having achieved remission: Secondary | ICD-10-CM | POA: Diagnosis not present

## 2019-08-10 DIAGNOSIS — Z52091 Other blood donor, stem cells: Secondary | ICD-10-CM | POA: Diagnosis not present

## 2019-08-14 DIAGNOSIS — Z9481 Bone marrow transplant status: Secondary | ICD-10-CM | POA: Diagnosis not present

## 2019-08-14 DIAGNOSIS — C9000 Multiple myeloma not having achieved remission: Secondary | ICD-10-CM | POA: Diagnosis not present

## 2019-08-16 DIAGNOSIS — Z79899 Other long term (current) drug therapy: Secondary | ICD-10-CM | POA: Diagnosis not present

## 2019-08-16 DIAGNOSIS — M1380 Other specified arthritis, unspecified site: Secondary | ICD-10-CM | POA: Diagnosis not present

## 2019-08-16 DIAGNOSIS — C9000 Multiple myeloma not having achieved remission: Secondary | ICD-10-CM | POA: Diagnosis not present

## 2019-08-16 DIAGNOSIS — T451X5A Adverse effect of antineoplastic and immunosuppressive drugs, initial encounter: Secondary | ICD-10-CM | POA: Diagnosis not present

## 2019-08-16 DIAGNOSIS — E569 Vitamin deficiency, unspecified: Secondary | ICD-10-CM | POA: Diagnosis not present

## 2019-08-16 DIAGNOSIS — M1990 Unspecified osteoarthritis, unspecified site: Secondary | ICD-10-CM | POA: Diagnosis not present

## 2019-08-16 DIAGNOSIS — D6959 Other secondary thrombocytopenia: Secondary | ICD-10-CM | POA: Diagnosis not present

## 2019-08-16 DIAGNOSIS — I10 Essential (primary) hypertension: Secondary | ICD-10-CM | POA: Diagnosis not present

## 2019-08-16 DIAGNOSIS — Z9484 Stem cells transplant status: Secondary | ICD-10-CM | POA: Diagnosis not present

## 2019-08-17 DIAGNOSIS — Z9484 Stem cells transplant status: Secondary | ICD-10-CM | POA: Diagnosis not present

## 2019-08-17 DIAGNOSIS — C9000 Multiple myeloma not having achieved remission: Secondary | ICD-10-CM | POA: Diagnosis not present

## 2019-08-18 DIAGNOSIS — Z9484 Stem cells transplant status: Secondary | ICD-10-CM | POA: Diagnosis not present

## 2019-08-18 DIAGNOSIS — C9000 Multiple myeloma not having achieved remission: Secondary | ICD-10-CM | POA: Diagnosis not present

## 2019-08-18 DIAGNOSIS — T451X5A Adverse effect of antineoplastic and immunosuppressive drugs, initial encounter: Secondary | ICD-10-CM | POA: Diagnosis not present

## 2019-08-18 DIAGNOSIS — D61810 Antineoplastic chemotherapy induced pancytopenia: Secondary | ICD-10-CM | POA: Diagnosis not present

## 2019-08-19 DIAGNOSIS — Z79899 Other long term (current) drug therapy: Secondary | ICD-10-CM | POA: Diagnosis not present

## 2019-08-19 DIAGNOSIS — T451X5A Adverse effect of antineoplastic and immunosuppressive drugs, initial encounter: Secondary | ICD-10-CM | POA: Diagnosis not present

## 2019-08-19 DIAGNOSIS — C9000 Multiple myeloma not having achieved remission: Secondary | ICD-10-CM | POA: Diagnosis not present

## 2019-08-19 DIAGNOSIS — M1380 Other specified arthritis, unspecified site: Secondary | ICD-10-CM | POA: Diagnosis not present

## 2019-08-19 DIAGNOSIS — Z9484 Stem cells transplant status: Secondary | ICD-10-CM | POA: Diagnosis not present

## 2019-08-19 DIAGNOSIS — M1990 Unspecified osteoarthritis, unspecified site: Secondary | ICD-10-CM | POA: Diagnosis not present

## 2019-08-19 DIAGNOSIS — I10 Essential (primary) hypertension: Secondary | ICD-10-CM | POA: Diagnosis not present

## 2019-08-19 DIAGNOSIS — D6959 Other secondary thrombocytopenia: Secondary | ICD-10-CM | POA: Diagnosis not present

## 2019-08-20 DIAGNOSIS — Z9484 Stem cells transplant status: Secondary | ICD-10-CM | POA: Diagnosis not present

## 2019-08-20 DIAGNOSIS — D61810 Antineoplastic chemotherapy induced pancytopenia: Secondary | ICD-10-CM | POA: Diagnosis not present

## 2019-08-20 DIAGNOSIS — R972 Elevated prostate specific antigen [PSA]: Secondary | ICD-10-CM | POA: Diagnosis not present

## 2019-08-20 DIAGNOSIS — T451X5A Adverse effect of antineoplastic and immunosuppressive drugs, initial encounter: Secondary | ICD-10-CM | POA: Diagnosis not present

## 2019-08-20 DIAGNOSIS — M7918 Myalgia, other site: Secondary | ICD-10-CM | POA: Diagnosis not present

## 2019-08-20 DIAGNOSIS — R197 Diarrhea, unspecified: Secondary | ICD-10-CM | POA: Diagnosis not present

## 2019-08-20 DIAGNOSIS — M1990 Unspecified osteoarthritis, unspecified site: Secondary | ICD-10-CM | POA: Diagnosis not present

## 2019-08-20 DIAGNOSIS — Z6834 Body mass index (BMI) 34.0-34.9, adult: Secondary | ICD-10-CM | POA: Diagnosis not present

## 2019-08-20 DIAGNOSIS — R5383 Other fatigue: Secondary | ICD-10-CM | POA: Diagnosis not present

## 2019-08-20 DIAGNOSIS — E669 Obesity, unspecified: Secondary | ICD-10-CM | POA: Diagnosis not present

## 2019-08-20 DIAGNOSIS — Z79899 Other long term (current) drug therapy: Secondary | ICD-10-CM | POA: Diagnosis not present

## 2019-08-20 DIAGNOSIS — C9000 Multiple myeloma not having achieved remission: Secondary | ICD-10-CM | POA: Diagnosis not present

## 2019-08-20 DIAGNOSIS — G8929 Other chronic pain: Secondary | ICD-10-CM | POA: Diagnosis not present

## 2019-08-21 DIAGNOSIS — Z79899 Other long term (current) drug therapy: Secondary | ICD-10-CM | POA: Diagnosis not present

## 2019-08-21 DIAGNOSIS — D61810 Antineoplastic chemotherapy induced pancytopenia: Secondary | ICD-10-CM | POA: Diagnosis not present

## 2019-08-21 DIAGNOSIS — C9000 Multiple myeloma not having achieved remission: Secondary | ICD-10-CM | POA: Diagnosis not present

## 2019-08-21 DIAGNOSIS — T451X5A Adverse effect of antineoplastic and immunosuppressive drugs, initial encounter: Secondary | ICD-10-CM | POA: Diagnosis not present

## 2019-08-21 DIAGNOSIS — R110 Nausea: Secondary | ICD-10-CM | POA: Diagnosis not present

## 2019-08-21 DIAGNOSIS — Z9484 Stem cells transplant status: Secondary | ICD-10-CM | POA: Diagnosis not present

## 2019-08-21 DIAGNOSIS — R5383 Other fatigue: Secondary | ICD-10-CM | POA: Diagnosis not present

## 2019-08-22 DIAGNOSIS — E569 Vitamin deficiency, unspecified: Secondary | ICD-10-CM | POA: Diagnosis not present

## 2019-08-22 DIAGNOSIS — C9000 Multiple myeloma not having achieved remission: Secondary | ICD-10-CM | POA: Diagnosis not present

## 2019-08-22 DIAGNOSIS — D6959 Other secondary thrombocytopenia: Secondary | ICD-10-CM | POA: Diagnosis not present

## 2019-08-22 DIAGNOSIS — Z9484 Stem cells transplant status: Secondary | ICD-10-CM | POA: Diagnosis not present

## 2019-08-22 DIAGNOSIS — Z79899 Other long term (current) drug therapy: Secondary | ICD-10-CM | POA: Diagnosis not present

## 2019-08-22 DIAGNOSIS — E871 Hypo-osmolality and hyponatremia: Secondary | ICD-10-CM | POA: Diagnosis not present

## 2019-08-22 DIAGNOSIS — R197 Diarrhea, unspecified: Secondary | ICD-10-CM | POA: Diagnosis not present

## 2019-08-22 DIAGNOSIS — M1990 Unspecified osteoarthritis, unspecified site: Secondary | ICD-10-CM | POA: Diagnosis not present

## 2019-08-22 DIAGNOSIS — T451X5A Adverse effect of antineoplastic and immunosuppressive drugs, initial encounter: Secondary | ICD-10-CM | POA: Diagnosis not present

## 2019-08-22 DIAGNOSIS — I10 Essential (primary) hypertension: Secondary | ICD-10-CM | POA: Diagnosis not present

## 2019-08-23 DIAGNOSIS — Z4682 Encounter for fitting and adjustment of non-vascular catheter: Secondary | ICD-10-CM | POA: Diagnosis not present

## 2019-08-23 DIAGNOSIS — C9000 Multiple myeloma not having achieved remission: Secondary | ICD-10-CM | POA: Diagnosis not present

## 2019-08-24 DIAGNOSIS — T451X5A Adverse effect of antineoplastic and immunosuppressive drugs, initial encounter: Secondary | ICD-10-CM | POA: Diagnosis not present

## 2019-08-24 DIAGNOSIS — R197 Diarrhea, unspecified: Secondary | ICD-10-CM | POA: Diagnosis not present

## 2019-08-24 DIAGNOSIS — Z9484 Stem cells transplant status: Secondary | ICD-10-CM | POA: Diagnosis not present

## 2019-08-24 DIAGNOSIS — M1990 Unspecified osteoarthritis, unspecified site: Secondary | ICD-10-CM | POA: Diagnosis not present

## 2019-08-24 DIAGNOSIS — I10 Essential (primary) hypertension: Secondary | ICD-10-CM | POA: Diagnosis not present

## 2019-08-24 DIAGNOSIS — D6959 Other secondary thrombocytopenia: Secondary | ICD-10-CM | POA: Diagnosis not present

## 2019-08-24 DIAGNOSIS — C9000 Multiple myeloma not having achieved remission: Secondary | ICD-10-CM | POA: Diagnosis not present

## 2019-08-24 DIAGNOSIS — E871 Hypo-osmolality and hyponatremia: Secondary | ICD-10-CM | POA: Diagnosis not present

## 2019-08-24 DIAGNOSIS — Z79899 Other long term (current) drug therapy: Secondary | ICD-10-CM | POA: Diagnosis not present

## 2019-08-25 DIAGNOSIS — Z9484 Stem cells transplant status: Secondary | ICD-10-CM | POA: Diagnosis not present

## 2019-08-25 DIAGNOSIS — T451X5A Adverse effect of antineoplastic and immunosuppressive drugs, initial encounter: Secondary | ICD-10-CM | POA: Diagnosis not present

## 2019-08-25 DIAGNOSIS — R110 Nausea: Secondary | ICD-10-CM | POA: Diagnosis not present

## 2019-08-25 DIAGNOSIS — D61810 Antineoplastic chemotherapy induced pancytopenia: Secondary | ICD-10-CM | POA: Diagnosis not present

## 2019-08-25 DIAGNOSIS — C9000 Multiple myeloma not having achieved remission: Secondary | ICD-10-CM | POA: Diagnosis not present

## 2019-08-26 DIAGNOSIS — R110 Nausea: Secondary | ICD-10-CM | POA: Diagnosis not present

## 2019-08-26 DIAGNOSIS — Z9484 Stem cells transplant status: Secondary | ICD-10-CM | POA: Diagnosis not present

## 2019-08-26 DIAGNOSIS — C9000 Multiple myeloma not having achieved remission: Secondary | ICD-10-CM | POA: Diagnosis not present

## 2019-08-26 DIAGNOSIS — M1990 Unspecified osteoarthritis, unspecified site: Secondary | ICD-10-CM | POA: Diagnosis not present

## 2019-08-26 DIAGNOSIS — E559 Vitamin D deficiency, unspecified: Secondary | ICD-10-CM | POA: Diagnosis not present

## 2019-08-26 DIAGNOSIS — I10 Essential (primary) hypertension: Secondary | ICD-10-CM | POA: Diagnosis not present

## 2019-08-26 DIAGNOSIS — R197 Diarrhea, unspecified: Secondary | ICD-10-CM | POA: Diagnosis not present

## 2019-08-26 DIAGNOSIS — D6959 Other secondary thrombocytopenia: Secondary | ICD-10-CM | POA: Diagnosis not present

## 2019-08-26 DIAGNOSIS — E871 Hypo-osmolality and hyponatremia: Secondary | ICD-10-CM | POA: Diagnosis not present

## 2019-08-26 DIAGNOSIS — R630 Anorexia: Secondary | ICD-10-CM | POA: Diagnosis not present

## 2019-08-26 DIAGNOSIS — Z79899 Other long term (current) drug therapy: Secondary | ICD-10-CM | POA: Diagnosis not present

## 2019-08-27 DIAGNOSIS — T451X5A Adverse effect of antineoplastic and immunosuppressive drugs, initial encounter: Secondary | ICD-10-CM | POA: Diagnosis not present

## 2019-08-27 DIAGNOSIS — C9000 Multiple myeloma not having achieved remission: Secondary | ICD-10-CM | POA: Diagnosis not present

## 2019-08-27 DIAGNOSIS — M1990 Unspecified osteoarthritis, unspecified site: Secondary | ICD-10-CM | POA: Diagnosis not present

## 2019-08-27 DIAGNOSIS — D61810 Antineoplastic chemotherapy induced pancytopenia: Secondary | ICD-10-CM | POA: Diagnosis not present

## 2019-08-27 DIAGNOSIS — R110 Nausea: Secondary | ICD-10-CM | POA: Diagnosis not present

## 2019-08-27 DIAGNOSIS — Z9484 Stem cells transplant status: Secondary | ICD-10-CM | POA: Diagnosis not present

## 2019-08-28 DIAGNOSIS — C9000 Multiple myeloma not having achieved remission: Secondary | ICD-10-CM | POA: Diagnosis not present

## 2019-08-28 DIAGNOSIS — R110 Nausea: Secondary | ICD-10-CM | POA: Diagnosis not present

## 2019-08-28 DIAGNOSIS — R52 Pain, unspecified: Secondary | ICD-10-CM | POA: Diagnosis not present

## 2019-08-28 DIAGNOSIS — R197 Diarrhea, unspecified: Secondary | ICD-10-CM | POA: Diagnosis not present

## 2019-08-28 DIAGNOSIS — D61810 Antineoplastic chemotherapy induced pancytopenia: Secondary | ICD-10-CM | POA: Diagnosis not present

## 2019-08-28 DIAGNOSIS — Z79899 Other long term (current) drug therapy: Secondary | ICD-10-CM | POA: Diagnosis not present

## 2019-08-28 DIAGNOSIS — T451X5A Adverse effect of antineoplastic and immunosuppressive drugs, initial encounter: Secondary | ICD-10-CM | POA: Diagnosis not present

## 2019-08-29 DIAGNOSIS — M1990 Unspecified osteoarthritis, unspecified site: Secondary | ICD-10-CM | POA: Diagnosis not present

## 2019-08-29 DIAGNOSIS — Z79899 Other long term (current) drug therapy: Secondary | ICD-10-CM | POA: Diagnosis not present

## 2019-08-29 DIAGNOSIS — D6959 Other secondary thrombocytopenia: Secondary | ICD-10-CM | POA: Diagnosis not present

## 2019-08-29 DIAGNOSIS — Z452 Encounter for adjustment and management of vascular access device: Secondary | ICD-10-CM | POA: Diagnosis not present

## 2019-08-29 DIAGNOSIS — I10 Essential (primary) hypertension: Secondary | ICD-10-CM | POA: Diagnosis not present

## 2019-08-29 DIAGNOSIS — C9000 Multiple myeloma not having achieved remission: Secondary | ICD-10-CM | POA: Diagnosis not present

## 2019-08-29 DIAGNOSIS — R197 Diarrhea, unspecified: Secondary | ICD-10-CM | POA: Diagnosis not present

## 2019-08-29 DIAGNOSIS — E559 Vitamin D deficiency, unspecified: Secondary | ICD-10-CM | POA: Diagnosis not present

## 2019-08-29 DIAGNOSIS — Z9484 Stem cells transplant status: Secondary | ICD-10-CM | POA: Diagnosis not present

## 2019-08-29 DIAGNOSIS — R110 Nausea: Secondary | ICD-10-CM | POA: Diagnosis not present

## 2019-08-29 DIAGNOSIS — T451X5A Adverse effect of antineoplastic and immunosuppressive drugs, initial encounter: Secondary | ICD-10-CM | POA: Diagnosis not present

## 2019-08-30 ENCOUNTER — Telehealth: Payer: Self-pay | Admitting: Hematology and Oncology

## 2019-08-30 ENCOUNTER — Other Ambulatory Visit: Payer: Self-pay | Admitting: Hematology and Oncology

## 2019-08-30 DIAGNOSIS — C9000 Multiple myeloma not having achieved remission: Secondary | ICD-10-CM

## 2019-08-30 DIAGNOSIS — Z9481 Bone marrow transplant status: Secondary | ICD-10-CM | POA: Insufficient documentation

## 2019-08-30 DIAGNOSIS — D61818 Other pancytopenia: Secondary | ICD-10-CM | POA: Insufficient documentation

## 2019-08-30 DIAGNOSIS — Z9484 Stem cells transplant status: Secondary | ICD-10-CM | POA: Diagnosis not present

## 2019-08-30 NOTE — Telephone Encounter (Signed)
[facility] called to schedule post operation follow-up and lab appointment for patient. Scheduled lab in December and follow-up in January, they will be putting in the orders shortly.  Message to provider.

## 2019-08-30 NOTE — Telephone Encounter (Signed)
[place],  I appreciate the update but in the future, please let me know first before scheduling anything It is not up to them to decide when patient comes in. If we draw his labs and it came back abnormal, I am ultimately responsible for the patient. I will review his chart shortly  I will send new scheduling messages soon  Thank you

## 2019-08-30 NOTE — Telephone Encounter (Signed)
Scheduled appt per 11/25 sch message - unable to reach pt . Left message with appt date and time

## 2019-09-04 ENCOUNTER — Other Ambulatory Visit: Payer: Self-pay | Admitting: Hematology and Oncology

## 2019-09-06 ENCOUNTER — Telehealth: Payer: Self-pay | Admitting: *Deleted

## 2019-09-06 ENCOUNTER — Other Ambulatory Visit: Payer: Self-pay

## 2019-09-06 ENCOUNTER — Encounter: Payer: Self-pay | Admitting: Hematology and Oncology

## 2019-09-06 ENCOUNTER — Inpatient Hospital Stay (HOSPITAL_BASED_OUTPATIENT_CLINIC_OR_DEPARTMENT_OTHER): Payer: Medicare Other | Admitting: Hematology and Oncology

## 2019-09-06 ENCOUNTER — Inpatient Hospital Stay: Payer: Medicare Other | Attending: Hematology and Oncology

## 2019-09-06 DIAGNOSIS — R197 Diarrhea, unspecified: Secondary | ICD-10-CM | POA: Diagnosis not present

## 2019-09-06 DIAGNOSIS — Z9481 Bone marrow transplant status: Secondary | ICD-10-CM | POA: Diagnosis not present

## 2019-09-06 DIAGNOSIS — R748 Abnormal levels of other serum enzymes: Secondary | ICD-10-CM | POA: Diagnosis not present

## 2019-09-06 DIAGNOSIS — C9000 Multiple myeloma not having achieved remission: Secondary | ICD-10-CM

## 2019-09-06 DIAGNOSIS — I2510 Atherosclerotic heart disease of native coronary artery without angina pectoris: Secondary | ICD-10-CM | POA: Diagnosis not present

## 2019-09-06 DIAGNOSIS — G4700 Insomnia, unspecified: Secondary | ICD-10-CM | POA: Diagnosis not present

## 2019-09-06 DIAGNOSIS — Z79899 Other long term (current) drug therapy: Secondary | ICD-10-CM | POA: Diagnosis not present

## 2019-09-06 DIAGNOSIS — I700 Atherosclerosis of aorta: Secondary | ICD-10-CM | POA: Diagnosis not present

## 2019-09-06 DIAGNOSIS — R195 Other fecal abnormalities: Secondary | ICD-10-CM | POA: Diagnosis not present

## 2019-09-06 DIAGNOSIS — D61818 Other pancytopenia: Secondary | ICD-10-CM

## 2019-09-06 LAB — CBC WITH DIFFERENTIAL (CANCER CENTER ONLY)
Abs Immature Granulocytes: 0.15 10*3/uL — ABNORMAL HIGH (ref 0.00–0.07)
Basophils Absolute: 0.1 10*3/uL (ref 0.0–0.1)
Basophils Relative: 1 %
Eosinophils Absolute: 0 10*3/uL (ref 0.0–0.5)
Eosinophils Relative: 0 %
HCT: 40.2 % (ref 39.0–52.0)
Hemoglobin: 13.8 g/dL (ref 13.0–17.0)
Immature Granulocytes: 3 %
Lymphocytes Relative: 22 %
Lymphs Abs: 1.2 10*3/uL (ref 0.7–4.0)
MCH: 33.8 pg (ref 26.0–34.0)
MCHC: 34.3 g/dL (ref 30.0–36.0)
MCV: 98.5 fL (ref 80.0–100.0)
Monocytes Absolute: 1.1 10*3/uL — ABNORMAL HIGH (ref 0.1–1.0)
Monocytes Relative: 21 %
Neutro Abs: 2.8 10*3/uL (ref 1.7–7.7)
Neutrophils Relative %: 53 %
Platelet Count: 271 10*3/uL (ref 150–400)
RBC: 4.08 MIL/uL — ABNORMAL LOW (ref 4.22–5.81)
RDW: 14.6 % (ref 11.5–15.5)
WBC Count: 5.3 10*3/uL (ref 4.0–10.5)
nRBC: 0 % (ref 0.0–0.2)

## 2019-09-06 LAB — CMP (CANCER CENTER ONLY)
ALT: 118 U/L — ABNORMAL HIGH (ref 0–44)
AST: 61 U/L — ABNORMAL HIGH (ref 15–41)
Albumin: 3.2 g/dL — ABNORMAL LOW (ref 3.5–5.0)
Alkaline Phosphatase: 80 U/L (ref 38–126)
Anion gap: 10 (ref 5–15)
BUN: 11 mg/dL (ref 8–23)
CO2: 25 mmol/L (ref 22–32)
Calcium: 8.3 mg/dL — ABNORMAL LOW (ref 8.9–10.3)
Chloride: 102 mmol/L (ref 98–111)
Creatinine: 0.79 mg/dL (ref 0.61–1.24)
GFR, Est AFR Am: >60 mL/min (ref >60)
GFR, Estimated: >60 mL/min (ref >60)
Glucose, Bld: 106 mg/dL — ABNORMAL HIGH (ref 70–99)
Potassium: 4.4 mmol/L (ref 3.5–5.1)
Sodium: 137 mmol/L (ref 135–145)
Total Bilirubin: 0.4 mg/dL (ref 0.3–1.2)
Total Protein: 5.8 g/dL — ABNORMAL LOW (ref 6.5–8.1)

## 2019-09-06 LAB — SAMPLE TO BLOOD BANK

## 2019-09-06 LAB — MAGNESIUM: Magnesium: 2.1 mg/dL (ref 1.7–2.4)

## 2019-09-06 MED ORDER — ACYCLOVIR 400 MG PO TABS: 800.0000 mg | ORAL_TABLET | Freq: Two times a day (BID) | ORAL | 11 refills | Status: DC

## 2019-09-06 NOTE — Assessment & Plan Note (Addendum)
He is fully engrafted He has appointment to return to [facility] health on December 8 for further follow-up. He will resume maintenance treatment once he is cleared to resume therapy in that visit He will also resume maintenance bisphosphonate therapy in the near future.

## 2019-09-06 NOTE — Telephone Encounter (Signed)
Telephone call to patient and advised lab results as directed below. Patient states he has not had any alcohol in quiet some time.

## 2019-09-06 NOTE — Assessment & Plan Note (Signed)
Overall, this is improving He will continue Imodium as needed

## 2019-09-06 NOTE — Assessment & Plan Note (Signed)
We discussed sleep hygiene and conservative approach.

## 2019-09-06 NOTE — Assessment & Plan Note (Addendum)
He completed bone marrow transplant without major complications He is fully engrafted He is transfusion independent Per instruction from his bone marrow transplant team, I will see him again on January 5 for further follow-up He has appointment to return to [facility] next week He will discuss with his physician whether he can have some of his tests done locally in the future

## 2019-09-06 NOTE — Telephone Encounter (Signed)
-----   Message from Heath Lark, MD sent at 09/06/2019 11:11 AM EST ----- Regarding: labs Pls let him know liver enzymes came back a bit high Avoid alcohol for now if possible

## 2019-09-06 NOTE — Assessment & Plan Note (Signed)
Likely secondary to recent side effects of treatment He is not symptomatic Observe for now

## 2019-09-06 NOTE — Progress Notes (Signed)
[place] OFFICE PROGRESS NOTE  Patient Care Team: Denita Lung, MD as PCP - General (Family Medicine) Heath Lark, MD as Consulting Physician (Hematology and Oncology)  ASSESSMENT & PLAN:  Multiple myeloma without remission [facility]) He completed bone marrow transplant without major complications He is fully engrafted He is transfusion independent Per instruction from his bone marrow transplant team, I will see him again on January 5 for further follow-up He has appointment to return to [facility] next week He will discuss with his physician whether he can have some of his tests done locally in the future  S/P bone marrow transplant [facility]) He is fully engrafted He has appointment to return to [facility] health on December 8 for further follow-up. He will resume maintenance treatment once he is cleared to resume therapy in that visit He will also resume maintenance bisphosphonate therapy in the near future.  Elevated liver enzymes Likely secondary to recent side effects of treatment He is not symptomatic Observe for now  Insomnia disorder We discussed sleep hygiene and conservative approach.  Loose stools Overall, this is improving He will continue Imodium as needed   No orders of the defined types were placed in this encounter.   INTERVAL HISTORY: Please see below for problem oriented charting. He returns for further follow-up after his recent bone marrow transplant I have reviewed his records extensively. I have also reconciled his medications today  He feels better since he left the transplant unit He continues to have mild loose stool but it is getting less and more formed He has lost some weight but he felt that his appetite is improving He complained of some insomnia He denies persistent back pain and has not taken any pain medicine recently No recent fever or chills  SUMMARY OF ONCOLOGIC HISTORY: Oncology History  Multiple myeloma  without remission ([place])  03/20/2019 Imaging   1. Tumor involving the L5 and S1 and S2 segments of the spine as described with mass effects upon the left L4, L5, S1 and more distal left sacral nerves as described above. 2. Does the patient have a history of malignancy? 3. Multiple plasmacytomas and metastatic disease could give this appearance   03/27/2019 Pathology Results   Soft Tissue Needle Core Biopsy, sacral mass - PLASMABLASTIC NEOPLASM. - SEE COMMENT. Microscopic Comment The sections show needle core biopsy fragments of soft tissue densely infiltrated by a relatively monomorphic infiltrate of atypical plasmacytoid cells characterized by vesicular or partially clumped chromatin and prominent nucleoli. This is associated with scattered mitosis. A battery of immunohistochemical stains was performed and show that the atypical plasmacytoid cells are positive for CD138, CD43 and cytoplasmic kappa. There is also weak positivity for CD56 and partial variable positivity for cyclin D1. The atypical plasmacytoid cells are negative for cytoplasmic lambda, CD10, PAX5, CD79a, CD20, CD3, CD5, CD34, EBV (ISH) and mostly negative for LCA. The overall morphologic and histologic features are most compatible with a plasmablastic neoplasm. The differential diagnosis includes plasmablastic lymphoma and plasmablastic plasmacytoma/myeloma. Based on the overall phenotypic features and the clinical setting, plasmacytoma/myeloma is favored. Clinical correlation and hematologic evaluation is recommended   03/27/2019 Procedure   Technically successful CT-guided biopsy of sacral soft tissue mass.   04/04/2019 Initial Diagnosis   Multiple myeloma without remission ([place])   04/11/2019 PET scan   IMPRESSION: 1. Previously noted expansile lesions involving the L5 vertebra and sacrum are again noted and exhibit intense FDG uptake compatible with metabolically active disease. A third focus  of increased uptake without  corresponding CT abnormality is noted within the intertrochanteric portions of the proximal right femur. 2. Small lucent lesion within the T3 vertebra is noted without corresponding FDG uptake. 3. Asymmetric left bladder wall thickening, etiology indeterminate. 4. Aortic Atherosclerosis (ICD10-I70.0). Lad coronary artery calcification.   04/12/2019 Cancer Staging   Staging form: Plasma Cell Myeloma and Plasma Cell Disorders, AJCC 8th Edition - Clinical stage from 04/12/2019: Beta-2-microglobulin (mg/L): 2, Albumin (g/dL): 3.6, ISS: Stage I, High-risk cytogenetics: Unknown, LDH: Unknown - Signed by Heath Lark, MD on 04/12/2019   04/14/2019 Bone Marrow Biopsy   Bone Marrow, Aspirate,Biopsy, and Clot, right iliac bone - PLASMA CELL MYELOMA.   04/17/2019 - 07/11/2019 Chemotherapy   The patient had bortezemib, revlimid and dexamethasone for chemotherapy treatment.     08/17/2019 Bone Marrow Transplant   He received high dose melphalan followed by autologous stem cell transplant at [place]:   Constitutional: Denies fevers, chills Eyes: Denies blurriness of vision Ears, nose, mouth, throat, and face: Denies mucositis or sore throat Respiratory: Denies cough, dyspnea or wheezes Cardiovascular: Denies palpitation, chest discomfort or lower extremity swelling Skin: Denies abnormal skin rashes Lymphatics: Denies new lymphadenopathy or easy bruising Neurological:Denies numbness, tingling or new weaknesses Behavioral/Psych: Mood is stable, no new changes  All other systems were reviewed with the patient and are negative.  I have reviewed the past medical history, past surgical history, social history and family history with the patient and they are unchanged from previous note.  ALLERGIES:  has No Known Allergies.  MEDICATIONS:  Current Outpatient Medications  Medication Sig Dispense Refill  . folic acid (FOLVITE) 1 MG tablet Take 1 mg by mouth daily.    Marland Kitchen loperamide  (IMODIUM A-D) 2 MG tablet Take 2 mg by mouth 4 (four) times daily as needed.    . sulfamethoxazole-trimethoprim (BACTRIM DS) 800-160 MG tablet Take 1 tablet by mouth 3 (three) times a week.    . traMADol (ULTRAM) 50 MG tablet Take 50 mg by mouth every 6 (six) hours as needed.    . vitamin B-12 (CYANOCOBALAMIN) 1000 MCG tablet Take 1,000 mcg by mouth daily.    Marland Kitchen acetaminophen (TYLENOL) 500 MG tablet Take 500 mg by mouth every 6 (six) hours as needed.    Marland Kitchen acyclovir (ZOVIRAX) 400 MG tablet Take 2 tablets (800 mg total) by mouth 2 (two) times daily. 180 tablet 11  . calcium carbonate (TUMS - DOSED IN MG ELEMENTAL CALCIUM) 500 MG chewable tablet Chew 1 tablet by mouth 2 (two) times daily.    . cholecalciferol (VITAMIN D3) 25 MCG (1000 UT) tablet Take 2,000 Units by mouth daily.    . diclofenac sodium (VOLTAREN) 1 % GEL Apply 2 g topically 4 (four) times daily as needed.    Marland Kitchen lenalidomide (REVLIMID) 15 MG capsule Take 1 capsule (15 mg total) by mouth daily. Take for 21 days on, 7 days off, repeat every 28 days. Celgene ERXV#[id number] 21 capsule 0  . ondansetron (ZOFRAN) 8 MG tablet Take 1 tablet (8 mg total) by mouth 2 (two) times daily as needed (Nausea or vomiting). 30 tablet 1  . prochlorperazine (COMPAZINE) 10 MG tablet Take 1 tablet (10 mg total) by mouth every 6 (six) hours as needed (Nausea or vomiting). 30 tablet 1   No current facility-administered medications for this visit.     PHYSICAL EXAMINATION: ECOG PERFORMANCE STATUS: 1 - Symptomatic but completely ambulatory  Vitals:   09/06/19 1009  BP: (!) 130/96  Pulse: (!) 111  Resp: 18  Temp: 97.9 F (36.6 C)  SpO2: 99%   Filed Weights   09/06/19 1009  Weight: 205 lb 8 oz (93.2 kg)    GENERAL:alert, no distress and comfortable SKIN: skin color, texture, turgor are normal, no rashes or significant lesions EYES: normal, Conjunctiva are pink and non-injected, sclera clear OROPHARYNX:no exudate, no erythema and lips, buccal mucosa, and  tongue normal  NECK: supple, thyroid normal size, non-tender, without nodularity LYMPH:  no palpable lymphadenopathy in the cervical, axillary or inguinal LUNGS: clear to auscultation and percussion with normal breathing effort HEART: regular rate & rhythm and no murmurs and no lower extremity edema ABDOMEN:abdomen soft, non-tender and normal bowel sounds Musculoskeletal:no cyanosis of digits and no clubbing  NEURO: alert & oriented x 3 with fluent speech, no focal motor/sensory deficits  LABORATORY DATA:  I have reviewed the data as listed    Component Value Date/Time   NA 137 09/06/2019 0944   NA 139 10/11/2018 1115   NA 139 04/05/2014 0849   K 4.4 09/06/2019 0944   K 4.4 04/05/2014 0849   CL 102 09/06/2019 0944   CO2 25 09/06/2019 0944   CO2 23 04/05/2014 0849   GLUCOSE 106 (H) 09/06/2019 0944   GLUCOSE 109 04/05/2014 0849   BUN 11 09/06/2019 0944   BUN 23 10/11/2018 1115   BUN 16.8 04/05/2014 0849   CREATININE 0.79 09/06/2019 0944   CREATININE 0.91 07/29/2017 1004   CREATININE 0.9 04/05/2014 0849   CALCIUM 8.3 (L) 09/06/2019 0944   CALCIUM 8.7 04/05/2014 0849   PROT 5.8 (L) 09/06/2019 0944   PROT 7.2 10/11/2018 1115   PROT 6.7 04/05/2014 0849   ALBUMIN 3.2 (L) 09/06/2019 0944   ALBUMIN 4.7 10/11/2018 1115   ALBUMIN 3.8 04/05/2014 0849   AST 61 (H) 09/06/2019 0944   AST 26 04/05/2014 0849   ALT 118 (H) 09/06/2019 0944   ALT 30 04/05/2014 0849   ALKPHOS 80 09/06/2019 0944   ALKPHOS 75 04/05/2014 0849   BILITOT 0.4 09/06/2019 0944   BILITOT 0.71 04/05/2014 0849   GFRNONAA >60 09/06/2019 0944   GFRAA >60 09/06/2019 0944    No results found for: SPEP, UPEP  Lab Results  Component Value Date   WBC 5.3 09/06/2019   NEUTROABS 2.8 09/06/2019   HGB 13.8 09/06/2019   HCT 40.2 09/06/2019   MCV 98.5 09/06/2019   PLT 271 09/06/2019      Chemistry      Component Value Date/Time   NA 137 09/06/2019 0944   NA 139 10/11/2018 1115   NA 139 04/05/2014 0849   K 4.4  09/06/2019 0944   K 4.4 04/05/2014 0849   CL 102 09/06/2019 0944   CO2 25 09/06/2019 0944   CO2 23 04/05/2014 0849   BUN 11 09/06/2019 0944   BUN 23 10/11/2018 1115   BUN 16.8 04/05/2014 0849   CREATININE 0.79 09/06/2019 0944   CREATININE 0.91 07/29/2017 1004   CREATININE 0.9 04/05/2014 0849   GLU 10 04/10/2014      Component Value Date/Time   CALCIUM 8.3 (L) 09/06/2019 0944   CALCIUM 8.7 04/05/2014 0849   ALKPHOS 80 09/06/2019 0944   ALKPHOS 75 04/05/2014 0849   AST 61 (H) 09/06/2019 0944   AST 26 04/05/2014 0849   ALT 118 (H) 09/06/2019 0944   ALT 30 04/05/2014 0849   BILITOT 0.4 09/06/2019 0944   BILITOT 0.71 04/05/2014 0849  All questions were answered. The patient knows to call the clinic with any problems, questions or concerns. No barriers to learning was detected.  I spent 25 minutes counseling the patient face to face. The total time spent in the appointment was 30 minutes and more than 50% was on counseling and review of test results  Heath Lark, MD 09/06/2019 11:16 AM

## 2019-09-08 ENCOUNTER — Telehealth: Payer: Self-pay | Admitting: Hematology and Oncology

## 2019-09-08 NOTE — Telephone Encounter (Signed)
Confirmed January appointment with patient.

## 2019-09-11 ENCOUNTER — Telehealth: Payer: Self-pay | Admitting: Hematology and Oncology

## 2019-09-11 NOTE — Telephone Encounter (Signed)
Faxed medical records to [facility] at [phone], Release [id number]

## 2019-09-12 DIAGNOSIS — C9000 Multiple myeloma not having achieved remission: Secondary | ICD-10-CM | POA: Diagnosis not present

## 2019-09-12 DIAGNOSIS — E538 Deficiency of other specified B group vitamins: Secondary | ICD-10-CM | POA: Diagnosis not present

## 2019-09-12 DIAGNOSIS — R3911 Hesitancy of micturition: Secondary | ICD-10-CM | POA: Diagnosis not present

## 2019-09-12 DIAGNOSIS — N3943 Post-void dribbling: Secondary | ICD-10-CM | POA: Diagnosis not present

## 2019-09-12 DIAGNOSIS — Z79899 Other long term (current) drug therapy: Secondary | ICD-10-CM | POA: Diagnosis not present

## 2019-09-12 DIAGNOSIS — R350 Frequency of micturition: Secondary | ICD-10-CM | POA: Diagnosis not present

## 2019-09-12 DIAGNOSIS — Z792 Long term (current) use of antibiotics: Secondary | ICD-10-CM | POA: Diagnosis not present

## 2019-09-12 DIAGNOSIS — Z9484 Stem cells transplant status: Secondary | ICD-10-CM | POA: Diagnosis not present

## 2019-09-20 NOTE — Telephone Encounter (Signed)
err

## 2019-10-10 ENCOUNTER — Inpatient Hospital Stay: Payer: Medicare Other

## 2019-10-10 ENCOUNTER — Inpatient Hospital Stay: Payer: Medicare Other | Admitting: Hematology and Oncology

## 2019-10-10 ENCOUNTER — Ambulatory Visit: Payer: Medicare Other | Admitting: Hematology and Oncology

## 2019-10-11 ENCOUNTER — Other Ambulatory Visit: Payer: Self-pay

## 2019-10-11 ENCOUNTER — Telehealth: Payer: Self-pay | Admitting: *Deleted

## 2019-10-11 ENCOUNTER — Encounter: Payer: Self-pay | Admitting: Hematology and Oncology

## 2019-10-11 ENCOUNTER — Inpatient Hospital Stay: Payer: Medicare Other | Attending: Hematology and Oncology

## 2019-10-11 ENCOUNTER — Inpatient Hospital Stay (HOSPITAL_BASED_OUTPATIENT_CLINIC_OR_DEPARTMENT_OTHER): Payer: Medicare Other | Admitting: Hematology and Oncology

## 2019-10-11 DIAGNOSIS — I700 Atherosclerosis of aorta: Secondary | ICD-10-CM | POA: Insufficient documentation

## 2019-10-11 DIAGNOSIS — Z9481 Bone marrow transplant status: Secondary | ICD-10-CM

## 2019-10-11 DIAGNOSIS — R251 Tremor, unspecified: Secondary | ICD-10-CM

## 2019-10-11 DIAGNOSIS — G629 Polyneuropathy, unspecified: Secondary | ICD-10-CM | POA: Diagnosis not present

## 2019-10-11 DIAGNOSIS — Z7189 Other specified counseling: Secondary | ICD-10-CM

## 2019-10-11 DIAGNOSIS — G620 Drug-induced polyneuropathy: Secondary | ICD-10-CM

## 2019-10-11 DIAGNOSIS — Z79899 Other long term (current) drug therapy: Secondary | ICD-10-CM | POA: Diagnosis not present

## 2019-10-11 DIAGNOSIS — R978 Other abnormal tumor markers: Secondary | ICD-10-CM | POA: Diagnosis not present

## 2019-10-11 DIAGNOSIS — R748 Abnormal levels of other serum enzymes: Secondary | ICD-10-CM

## 2019-10-11 DIAGNOSIS — L853 Xerosis cutis: Secondary | ICD-10-CM

## 2019-10-11 DIAGNOSIS — M549 Dorsalgia, unspecified: Secondary | ICD-10-CM | POA: Diagnosis not present

## 2019-10-11 DIAGNOSIS — G893 Neoplasm related pain (acute) (chronic): Secondary | ICD-10-CM

## 2019-10-11 DIAGNOSIS — C9000 Multiple myeloma not having achieved remission: Secondary | ICD-10-CM

## 2019-10-11 DIAGNOSIS — T451X5A Adverse effect of antineoplastic and immunosuppressive drugs, initial encounter: Secondary | ICD-10-CM

## 2019-10-11 DIAGNOSIS — D61818 Other pancytopenia: Secondary | ICD-10-CM

## 2019-10-11 LAB — CMP (CANCER CENTER ONLY)
ALT: 44 U/L (ref 0–44)
AST: 26 U/L (ref 15–41)
Albumin: 3.9 g/dL (ref 3.5–5.0)
Alkaline Phosphatase: 53 U/L (ref 38–126)
Anion gap: 9 (ref 5–15)
BUN: 17 mg/dL (ref 8–23)
CO2: 27 mmol/L (ref 22–32)
Calcium: 8.3 mg/dL — ABNORMAL LOW (ref 8.9–10.3)
Chloride: 104 mmol/L (ref 98–111)
Creatinine: 0.78 mg/dL (ref 0.61–1.24)
GFR, Est AFR Am: >60 mL/min (ref >60)
GFR, Estimated: >60 mL/min (ref >60)
Glucose, Bld: 92 mg/dL (ref 70–99)
Potassium: 4.1 mmol/L (ref 3.5–5.1)
Sodium: 140 mmol/L (ref 135–145)
Total Bilirubin: 1.1 mg/dL (ref 0.3–1.2)
Total Protein: 6.4 g/dL — ABNORMAL LOW (ref 6.5–8.1)

## 2019-10-11 LAB — CBC WITH DIFFERENTIAL (CANCER CENTER ONLY)
Abs Immature Granulocytes: 0.01 10*3/uL (ref 0.00–0.07)
Basophils Absolute: 0 10*3/uL (ref 0.0–0.1)
Basophils Relative: 1 %
Eosinophils Absolute: 0.1 10*3/uL (ref 0.0–0.5)
Eosinophils Relative: 2 %
HCT: 42 % (ref 39.0–52.0)
Hemoglobin: 14.4 g/dL (ref 13.0–17.0)
Immature Granulocytes: 0 %
Lymphocytes Relative: 38 %
Lymphs Abs: 2.6 10*3/uL (ref 0.7–4.0)
MCH: 34.4 pg — ABNORMAL HIGH (ref 26.0–34.0)
MCHC: 34.3 g/dL (ref 30.0–36.0)
MCV: 100.2 fL — ABNORMAL HIGH (ref 80.0–100.0)
Monocytes Absolute: 0.9 10*3/uL (ref 0.1–1.0)
Monocytes Relative: 14 %
Neutro Abs: 3.2 10*3/uL (ref 1.7–7.7)
Neutrophils Relative %: 45 %
Platelet Count: 208 10*3/uL (ref 150–400)
RBC: 4.19 MIL/uL — ABNORMAL LOW (ref 4.22–5.81)
RDW: 14.1 % (ref 11.5–15.5)
WBC Count: 6.9 10*3/uL (ref 4.0–10.5)
nRBC: 0 % (ref 0.0–0.2)

## 2019-10-11 LAB — MAGNESIUM: Magnesium: 2.2 mg/dL (ref 1.7–2.4)

## 2019-10-11 MED ORDER — TRAMADOL HCL 50 MG PO TABS: 50.0000 mg | ORAL_TABLET | Freq: Four times a day (QID) | ORAL | 0 refills | Status: DC | PRN

## 2019-10-11 NOTE — Assessment & Plan Note (Signed)
He had history of elevated liver enzymes but that has improved We will observe closely

## 2019-10-11 NOTE — Assessment & Plan Note (Signed)
He has very minimum back pain that comes and goes He has been taking tramadol as needed I refill his prescription today We discussed narcotic refill policy

## 2019-10-11 NOTE — Assessment & Plan Note (Signed)
We have extensive discussions about plan of care Once he had results done at [facility], depending on the decision whether enrollment in clinical trial versus maintenance Revlimid, I will schedule him to return accordingly.

## 2019-10-11 NOTE — Assessment & Plan Note (Signed)
Could be related to dry weather It does not bother him I recommend moisturizer cream and consideration for humidifier

## 2019-10-11 NOTE — Assessment & Plan Note (Signed)
The patient is almost completely recovered from side effects of treatment He had very mild residual peripheral neuropathy and some residual sciatica type back pain He has appointment to return to [facility] next month for further evaluation including full blood work, bone marrow aspirate and biopsy and imaging study I have reviewed the records from care everywhere in great detail about the plan of care I recommend he returns to his dentist for routine dental check before we proceed to resume treatment with bisphosphonate or Delton See He will continue prophylactic antimicrobial therapy as prescribed along with vitamin D supplement

## 2019-10-11 NOTE — Assessment & Plan Note (Signed)
He has recent tremors Examination revealed resting tremor It could be related to recent treatment versus undiagnosed hyperthyroidism I recommend him to get TSH checked when he returns to [facility]

## 2019-10-11 NOTE — Telephone Encounter (Signed)
Telephone call to patient to advise lab results as directed below. Patient appreciated the call.

## 2019-10-11 NOTE — Telephone Encounter (Signed)
-----   Message from Heath Lark, MD sent at 10/11/2019 10:13 AM EST ----- Regarding: pls call him and let him know the rest of labs including liver tests are normal

## 2019-10-11 NOTE — Progress Notes (Signed)
[place] OFFICE PROGRESS NOTE  Patient Care Team: Denita Lung, MD as PCP - General (Family Medicine) Heath Lark, MD as Consulting Physician (Hematology and Oncology)  ASSESSMENT & PLAN:  Multiple myeloma without remission [facility]) The patient is almost completely recovered from side effects of treatment He had very mild residual peripheral neuropathy and some residual sciatica type back pain He has appointment to return to [facility] next month for further evaluation including full blood work, bone marrow aspirate and biopsy and imaging study I have reviewed the records from care everywhere in great detail about the plan of care I recommend he returns to his dentist for routine dental check before we proceed to resume treatment with bisphosphonate or Delton See He will continue prophylactic antimicrobial therapy as prescribed along with vitamin D supplement  Cancer associated pain He has very minimum back pain that comes and goes He has been taking tramadol as needed I refill his prescription today We discussed narcotic refill policy  Elevated liver enzymes He had history of elevated liver enzymes but that has improved We will observe closely  Tremor of both hands He has recent tremors Examination revealed resting tremor It could be related to recent treatment versus undiagnosed hyperthyroidism I recommend him to get TSH checked when he returns to [place] of care, counseling/discussion We have extensive discussions about plan of care Once he had results done at [facility], depending on the decision whether enrollment in clinical trial versus maintenance Revlimid, I will schedule him to return accordingly.  Peripheral neuropathy due to chemotherapy [facility]) He has mild peripheral neuropathy from prior treatment and some residual back pain from prior treatment We discussed the role of physical therapy and rehab He will think about it  and will call me if he is interested  Dry skin Could be related to dry weather It does not bother him I recommend moisturizer cream and consideration for humidifier   No orders of the defined types were placed in this encounter.   INTERVAL HISTORY: Please see below for problem oriented charting. Patient returns for further follow-up He forgot about his appointment yesterday He feels well He has not seen the dentist for some time He complains of sensation of mild neuropathy affecting the tips of his toes He also have mild residual back pain that sometimes radiates down to his groin It does not affect his ability to exercise or walk His energy level has improved close to 97% compared to baseline He is wondering whether the cause of his resting tremor He does not have any other new neurological symptoms The tremor is causing his difficulties with house chores such as caring objects He also noticed some dry skin No recent infection, fever or chills His appetite is good  SUMMARY OF ONCOLOGIC HISTORY: Oncology History  Multiple myeloma without remission ([place])  03/20/2019 Imaging   1. Tumor involving the L5 and S1 and S2 segments of the spine as described with mass effects upon the left L4, L5, S1 and more distal left sacral nerves as described above. 2. Does the patient have a history of malignancy? 3. Multiple plasmacytomas and metastatic disease could give this appearance   03/27/2019 Pathology Results   Soft Tissue Needle Core Biopsy, sacral mass - PLASMABLASTIC NEOPLASM. - SEE COMMENT. Microscopic Comment The sections show needle core biopsy fragments of soft tissue densely infiltrated by a relatively monomorphic infiltrate of atypical plasmacytoid cells characterized by vesicular or partially clumped chromatin  and prominent nucleoli. This is associated with scattered mitosis. A battery of immunohistochemical stains was performed and show that the atypical plasmacytoid cells are  positive for CD138, CD43 and cytoplasmic kappa. There is also weak positivity for CD56 and partial variable positivity for cyclin D1. The atypical plasmacytoid cells are negative for cytoplasmic lambda, CD10, PAX5, CD79a, CD20, CD3, CD5, CD34, EBV (ISH) and mostly negative for LCA. The overall morphologic and histologic features are most compatible with a plasmablastic neoplasm. The differential diagnosis includes plasmablastic lymphoma and plasmablastic plasmacytoma/myeloma. Based on the overall phenotypic features and the clinical setting, plasmacytoma/myeloma is favored. Clinical correlation and hematologic evaluation is recommended   03/27/2019 Procedure   Technically successful CT-guided biopsy of sacral soft tissue mass.   04/04/2019 Initial Diagnosis   Multiple myeloma without remission ([place])   04/11/2019 PET scan   IMPRESSION: 1. Previously noted expansile lesions involving the L5 vertebra and sacrum are again noted and exhibit intense FDG uptake compatible with metabolically active disease. A third focus of increased uptake without corresponding CT abnormality is noted within the intertrochanteric portions of the proximal right femur. 2. Small lucent lesion within the T3 vertebra is noted without corresponding FDG uptake. 3. Asymmetric left bladder wall thickening, etiology indeterminate. 4. Aortic Atherosclerosis (ICD10-I70.0). Lad coronary artery calcification.   04/12/2019 Cancer Staging   Staging form: Plasma Cell Myeloma and Plasma Cell Disorders, AJCC 8th Edition - Clinical stage from 04/12/2019: Beta-2-microglobulin (mg/L): 2, Albumin (g/dL): 3.6, ISS: Stage I, High-risk cytogenetics: Unknown, LDH: Unknown - Signed by Heath Lark, MD on 04/12/2019   04/14/2019 Bone Marrow Biopsy   Bone Marrow, Aspirate,Biopsy, and Clot, right iliac bone - PLASMA CELL MYELOMA.   04/17/2019 - 07/11/2019 Chemotherapy   The patient had bortezemib, revlimid and dexamethasone for chemotherapy treatment.      08/17/2019 Bone Marrow Transplant   He received high dose melphalan followed by autologous stem cell transplant at [facility]:   Constitutional: Denies fevers, chills or abnormal weight loss Eyes: Denies blurriness of vision Ears, nose, mouth, throat, and face: Denies mucositis or sore throat Respiratory: Denies cough, dyspnea or wheezes Cardiovascular: Denies palpitation, chest discomfort or lower extremity swelling Gastrointestinal:  Denies nausea, heartburn or change in bowel habits Skin: Denies abnormal skin rashes Lymphatics: Denies new lymphadenopathy or easy bruising Behavioral/Psych: Mood is stable, no new changes  All other systems were reviewed with the patient and are negative.  I have reviewed the past medical history, past surgical history, social history and family history with the patient and they are unchanged from previous note.  ALLERGIES:  has No Known Allergies.  MEDICATIONS:  Current Outpatient Medications  Medication Sig Dispense Refill  . acetaminophen (TYLENOL) 500 MG tablet Take 500 mg by mouth every 6 (six) hours as needed.    Marland Kitchen acyclovir (ZOVIRAX) 400 MG tablet Take 2 tablets (800 mg total) by mouth 2 (two) times daily. 180 tablet 11  . calcium carbonate (TUMS - DOSED IN MG ELEMENTAL CALCIUM) 500 MG chewable tablet Chew 1 tablet by mouth 2 (two) times daily.    . cholecalciferol (VITAMIN D3) 25 MCG (1000 UT) tablet Take 2,000 Units by mouth daily.    . diclofenac sodium (VOLTAREN) 1 % GEL Apply 2 g topically 4 (four) times daily as needed.    . folic acid (FOLVITE) 1 MG tablet Take 1 mg by mouth daily.    Marland Kitchen loperamide (IMODIUM A-D) 2 MG tablet Take 2 mg by mouth 4 (four)  times daily as needed.    . ondansetron (ZOFRAN) 8 MG tablet Take 1 tablet (8 mg total) by mouth 2 (two) times daily as needed (Nausea or vomiting). 30 tablet 1  . prochlorperazine (COMPAZINE) 10 MG tablet Take 1 tablet (10 mg total) by mouth every 6 (six) hours as  needed (Nausea or vomiting). 30 tablet 1  . sulfamethoxazole-trimethoprim (BACTRIM DS) 800-160 MG tablet Take 1 tablet by mouth 3 (three) times a week.    . traMADol (ULTRAM) 50 MG tablet Take 1 tablet (50 mg total) by mouth every 6 (six) hours as needed. 30 tablet 0  . vitamin B-12 (CYANOCOBALAMIN) 1000 MCG tablet Take 1,000 mcg by mouth daily.     No current facility-administered medications for this visit.    PHYSICAL EXAMINATION: ECOG PERFORMANCE STATUS: 0 - Asymptomatic  Vitals:   10/11/19 0935  BP: 123/89  Pulse: (!) 110  Resp: 18  Temp: 97.8 F (36.6 C)  SpO2: 100%   Filed Weights   10/11/19 0935  Weight: 202 lb 8 oz (91.9 kg)    GENERAL:alert, no distress and comfortable SKIN: skin color, texture, turgor are normal, no rashes or significant lesions EYES: normal, Conjunctiva are pink and non-injected, sclera clear OROPHARYNX:no exudate, no erythema and lips, buccal mucosa, and tongue normal  NECK: supple, thyroid normal size, non-tender, without nodularity LYMPH:  no palpable lymphadenopathy in the cervical, axillary or inguinal LUNGS: clear to auscultation and percussion with normal breathing effort HEART: regular rate & rhythm and no murmurs and no lower extremity edema ABDOMEN:abdomen soft, non-tender and normal bowel sounds Musculoskeletal:no cyanosis of digits and no clubbing.  Noted mild resting tremor NEURO: alert & oriented x 3 with fluent speech, no focal motor/sensory deficits  LABORATORY DATA:  I have reviewed the data as listed    Component Value Date/Time   NA 140 10/11/2019 0913   NA 139 10/11/2018 1115   NA 139 04/05/2014 0849   K 4.1 10/11/2019 0913   K 4.4 04/05/2014 0849   CL 104 10/11/2019 0913   CO2 27 10/11/2019 0913   CO2 23 04/05/2014 0849   GLUCOSE 92 10/11/2019 0913   GLUCOSE 109 04/05/2014 0849   BUN 17 10/11/2019 0913   BUN 23 10/11/2018 1115   BUN 16.8 04/05/2014 0849   CREATININE 0.78 10/11/2019 0913   CREATININE 0.91  07/29/2017 1004   CREATININE 0.9 04/05/2014 0849   CALCIUM 8.3 (L) 10/11/2019 0913   CALCIUM 8.7 04/05/2014 0849   PROT 6.4 (L) 10/11/2019 0913   PROT 7.2 10/11/2018 1115   PROT 6.7 04/05/2014 0849   ALBUMIN 3.9 10/11/2019 0913   ALBUMIN 4.7 10/11/2018 1115   ALBUMIN 3.8 04/05/2014 0849   AST 26 10/11/2019 0913   AST 26 04/05/2014 0849   ALT 44 10/11/2019 0913   ALT 30 04/05/2014 0849   ALKPHOS 53 10/11/2019 0913   ALKPHOS 75 04/05/2014 0849   BILITOT 1.1 10/11/2019 0913   BILITOT 0.71 04/05/2014 0849   GFRNONAA >60 10/11/2019 0913   GFRAA >60 10/11/2019 0913    No results found for: SPEP, UPEP  Lab Results  Component Value Date   WBC 6.9 10/11/2019   NEUTROABS 3.2 10/11/2019   HGB 14.4 10/11/2019   HCT 42.0 10/11/2019   MCV 100.2 (H) 10/11/2019   PLT 208 10/11/2019      Chemistry      Component Value Date/Time   NA 140 10/11/2019 0913   NA 139 10/11/2018 1115   NA 139 04/05/2014 0849  K 4.1 10/11/2019 0913   K 4.4 04/05/2014 0849   CL 104 10/11/2019 0913   CO2 27 10/11/2019 0913   CO2 23 04/05/2014 0849   BUN 17 10/11/2019 0913   BUN 23 10/11/2018 1115   BUN 16.8 04/05/2014 0849   CREATININE 0.78 10/11/2019 0913   CREATININE 0.91 07/29/2017 1004   CREATININE 0.9 04/05/2014 0849   GLU 10 04/10/2014 0000      Component Value Date/Time   CALCIUM 8.3 (L) 10/11/2019 0913   CALCIUM 8.7 04/05/2014 0849   ALKPHOS 53 10/11/2019 0913   ALKPHOS 75 04/05/2014 0849   AST 26 10/11/2019 0913   AST 26 04/05/2014 0849   ALT 44 10/11/2019 0913   ALT 30 04/05/2014 0849   BILITOT 1.1 10/11/2019 0913   BILITOT 0.71 04/05/2014 0849       All questions were answered. The patient knows to call the clinic with any problems, questions or concerns. No barriers to learning was detected.  I spent 30 minutes counseling the patient face to face. The total time spent in the appointment was 38 minutes including additional treatment planning, review of test results and care  coordination  Heath Lark, MD 10/11/2019 10:45 AM

## 2019-10-11 NOTE — Assessment & Plan Note (Signed)
He has mild peripheral neuropathy from prior treatment and some residual back pain from prior treatment We discussed the role of physical therapy and rehab He will think about it and will call me if he is interested

## 2019-10-16 ENCOUNTER — Telehealth: Payer: Self-pay

## 2019-10-16 NOTE — Telephone Encounter (Signed)
Yes, that is fine. 

## 2019-10-16 NOTE — Telephone Encounter (Signed)
Dr. Bridgett Larsson, a dentist called and left a message. He is a [employer]. Is is okay for Mr. Kosky to have a dental restoration? Any contra-indications? He needs to have a filling if it is okay with you.

## 2019-10-16 NOTE — Telephone Encounter (Signed)
Called office and given message to office staff. She verbalized understanding.

## 2019-10-24 ENCOUNTER — Telehealth: Payer: Self-pay

## 2019-10-24 ENCOUNTER — Other Ambulatory Visit: Payer: Self-pay | Admitting: Family Medicine

## 2019-10-24 DIAGNOSIS — I10 Essential (primary) hypertension: Secondary | ICD-10-CM

## 2019-10-24 NOTE — Telephone Encounter (Signed)
LVM for pt to call office ans schedule an appt for med check and fasting CPE. Med were filled. [place]

## 2019-10-24 NOTE — Telephone Encounter (Signed)
Pt called back concerning his appt. Pt is being seen and labs are being drawn often at [facility]. Pt will call back to schedule his CPE after his visit with them . Jesse Mcclure

## 2019-11-03 ENCOUNTER — Telehealth: Payer: Self-pay

## 2019-11-03 ENCOUNTER — Other Ambulatory Visit: Payer: Self-pay | Admitting: Hematology and Oncology

## 2019-11-03 MED ORDER — TRAMADOL HCL 50 MG PO TABS: 50.0000 mg | ORAL_TABLET | Freq: Four times a day (QID) | ORAL | 0 refills | Status: DC | PRN

## 2019-11-03 NOTE — Telephone Encounter (Signed)
done

## 2019-11-03 NOTE — Telephone Encounter (Signed)
Called and left a message requesting refill on Tramadol Rx.

## 2019-11-03 NOTE — Telephone Encounter (Signed)
Called and given below message. He verbalized understanding. 

## 2019-11-17 ENCOUNTER — Telehealth: Payer: Self-pay

## 2019-11-17 ENCOUNTER — Other Ambulatory Visit: Payer: Self-pay | Admitting: Hematology and Oncology

## 2019-11-17 MED ORDER — SULFAMETHOXAZOLE-TRIMETHOPRIM 800-160 MG PO TABS: 1.0000 | ORAL_TABLET | ORAL | 11 refills | Status: DC

## 2019-11-17 NOTE — Telephone Encounter (Signed)
He called and left a message. Requesting a Rx for Bactrim that he usually gets at [facility]. He takes it to prevent pneumonia.

## 2019-11-17 NOTE — Telephone Encounter (Signed)
I refilled it

## 2019-11-17 NOTE — Telephone Encounter (Signed)
Called and given below message. He verbalized understanding. 

## 2019-11-22 DIAGNOSIS — Z9484 Stem cells transplant status: Secondary | ICD-10-CM | POA: Diagnosis not present

## 2019-11-22 DIAGNOSIS — D72822 Plasmacytosis: Secondary | ICD-10-CM | POA: Diagnosis not present

## 2019-11-22 DIAGNOSIS — C9000 Multiple myeloma not having achieved remission: Secondary | ICD-10-CM | POA: Diagnosis not present

## 2019-11-24 ENCOUNTER — Other Ambulatory Visit: Payer: Self-pay | Admitting: Family Medicine

## 2019-11-24 DIAGNOSIS — C9000 Multiple myeloma not having achieved remission: Secondary | ICD-10-CM

## 2019-11-24 MED ORDER — PROCHLORPERAZINE MALEATE 10 MG PO TABS: 10.0000 mg | ORAL_TABLET | Freq: Four times a day (QID) | ORAL | 1 refills | Status: DC | PRN

## 2019-11-24 MED ORDER — LISINOPRIL-HYDROCHLOROTHIAZIDE 20-12.5 MG PO TABS: 1.0000 | ORAL_TABLET | Freq: Every day | ORAL | 3 refills | Status: DC

## 2019-11-29 DIAGNOSIS — C9000 Multiple myeloma not having achieved remission: Secondary | ICD-10-CM | POA: Diagnosis not present

## 2019-11-29 DIAGNOSIS — E538 Deficiency of other specified B group vitamins: Secondary | ICD-10-CM | POA: Diagnosis not present

## 2019-11-29 DIAGNOSIS — Z9484 Stem cells transplant status: Secondary | ICD-10-CM | POA: Diagnosis not present

## 2019-12-01 ENCOUNTER — Telehealth: Payer: Self-pay

## 2019-12-01 NOTE — Telephone Encounter (Signed)
I will wait for notes to come in today I will reach out to Dr. Norma Fredrickson next week if notes are still not signed

## 2019-12-01 NOTE — Telephone Encounter (Signed)
He called and left a message. He is calling to see if Dr. Norma Fredrickson has contacted you about making a referral for radiation.

## 2019-12-01 NOTE — Telephone Encounter (Signed)
Called and given below message. He verbalized understanding. 

## 2019-12-04 ENCOUNTER — Telehealth: Payer: Self-pay

## 2019-12-04 NOTE — Telephone Encounter (Signed)
Called per Dr. Alvy Bimler, per Dr. Norma Fredrickson he needs XRT to lumbar region.  Does he want the referral to be done here?  How is pain doing?  He verbalized understanding. He would like to get the radiation at [facility]. His pain is good. He just having arthritis pain in his knees.

## 2019-12-05 ENCOUNTER — Other Ambulatory Visit: Payer: Self-pay | Admitting: Hematology and Oncology

## 2019-12-05 DIAGNOSIS — C9000 Multiple myeloma not having achieved remission: Secondary | ICD-10-CM

## 2019-12-05 NOTE — Telephone Encounter (Signed)
I sent referral to see Dr. Sondra Come for radiation Please follow-up on that

## 2019-12-08 ENCOUNTER — Other Ambulatory Visit: Payer: Self-pay | Admitting: Family Medicine

## 2019-12-08 DIAGNOSIS — C9000 Multiple myeloma not having achieved remission: Secondary | ICD-10-CM

## 2019-12-08 NOTE — Telephone Encounter (Signed)
[facility] is requesting to fill pt prochlorperazine. Please advise [facility]

## 2019-12-13 ENCOUNTER — Ambulatory Visit
Admission: RE | Admit: 2019-12-13 | Discharge: 2019-12-13 | Disposition: A | Discharge status: Home / Self Care | Payer: Medicare Other | Source: Ambulatory Visit | Attending: Radiation Oncology | Admitting: Radiation Oncology

## 2019-12-13 ENCOUNTER — Encounter: Payer: Self-pay | Admitting: Radiation Oncology

## 2019-12-13 ENCOUNTER — Other Ambulatory Visit: Payer: Self-pay

## 2019-12-13 VITALS — BP 139/97 | HR 112 | Temp 98.2°F | Resp 18 | Ht 68.0 in | Wt 209.0 lb

## 2019-12-13 DIAGNOSIS — Z79899 Other long term (current) drug therapy: Secondary | ICD-10-CM | POA: Diagnosis not present

## 2019-12-13 DIAGNOSIS — E785 Hyperlipidemia, unspecified: Secondary | ICD-10-CM | POA: Insufficient documentation

## 2019-12-13 DIAGNOSIS — Z803 Family history of malignant neoplasm of breast: Secondary | ICD-10-CM | POA: Diagnosis not present

## 2019-12-13 DIAGNOSIS — N400 Enlarged prostate without lower urinary tract symptoms: Secondary | ICD-10-CM | POA: Insufficient documentation

## 2019-12-13 DIAGNOSIS — I10 Essential (primary) hypertension: Secondary | ICD-10-CM | POA: Insufficient documentation

## 2019-12-13 DIAGNOSIS — C9000 Multiple myeloma not having achieved remission: Secondary | ICD-10-CM

## 2019-12-13 DIAGNOSIS — Z862 Personal history of diseases of the blood and blood-forming organs and certain disorders involving the immune mechanism: Secondary | ICD-10-CM | POA: Diagnosis not present

## 2019-12-13 NOTE — Progress Notes (Signed)
Radiation Oncology         [phone]) [phone] ________________________________  Initial Outpatient Consultation  Name: Jesse Mcclure. MRN: [id number]  Date: 12/13/2019  DOB: 1951-07-04  GE:ZMOQHUT, Jesse Jarvis, MD  Heath Lark, MD   REFERRING PHYSICIAN: Heath Lark, MD  DIAGNOSIS: The encounter diagnosis was Multiple myeloma without remission ([place]).   HISTORY OF PRESENT ILLNESS::Jesse Mcclure. is a 69 y.o. male who is accompanied by his partner. He has a history of hemachromatosis, followed by Dr. Alvy Bimler.  The patient presented to his PCP on 11/24/2018 with ongoing back pain. He had been treated with prednisone at least two different times and was diagnosed with sciatica by Dr. Shirline Frees. Plain films of the lumbar spine were obtained that day and showed multilevel degenerative disc disease with no acute abnormality.   Unfortunately, the pain continued, and in 03/2019 began to disrupt his sleep. He underwent lumbar spine MRI on 03/20/2019, which revealed: tumor involving L5, S1, and S2 segments of the spine with mass effects upon left L4, L5, S1, and more distal left sacral nerves.  The patient underwent a biopsy of the sacral mass on 03/27/2019 showing: plasmablastic neoplasm, plasmacytoma/myeloma is favored. Lab work performed with Dr. Alvy Bimler on 04/04/2019 confirmed multiple myeloma.  He underwent PET scan on 04/11/2019 showing: L5 and sacral lesions again noted and exhibit intense FDG uptake; another focus of increased uptake without corresponding CT abnormality is noted within intertrochanteric portions of proximal right femur; small lucent lesion in T3 without FDG uptake; indeterminate asymmetric left bladder wall thickening.  A bone marrow aspirate biopsy was obtained on 04/14/2019 from the right iliac bone  and confirmed plasma cell myeloma. Peripheral blood showed borderline leukocytosis and macrocytosis. Flow cytometry of the bone marrow was performed on 04/17/2019 and showed no monoclonal  B-cell or phenotypically aberrant T-cell population.  He received 4 cycles of velcade under Dr. Alvy Bimler from 04/17/2019 through 07/11/2019. He was referred to [facility] on 05/15/2019 to discuss stem cell transplant. Following completion of chemotherapy, he was referred back to begin the transplant process, which was completed on 08/16/2019.  His most recent bone marrow biopsy performed on 11/22/2019 at [facility] showed: mild monoclonal plasmacytosis (approximately 2%), kappa light chain restricted; peripheral blood unremarkable. Most recent PET scan performed the same day showed: similar size and appearance of mildly hypermetabolic lytic lesion in L5; otherwise, no substantial FDG uptake compared to background bone marrow.  Due to the persistent hypermetabolic activity at L5 on PET, he has been kindly referred to me for consideration of radiation therapy directed to the lesion.  PREVIOUS RADIATION THERAPY: No  PAST MEDICAL HISTORY:  Past Medical History:  Diagnosis Date  . BPH (benign prostatic hypertrophy)   . Degenerative disc disease   . Diverticulosis   . Hemochromatosis   . HTN (hypertension)   . Hyperlipidemia   . Polio   . Status post Nissen fundoplication (without gastrostomy tube) procedure    for hiatal hernia.    PAST SURGICAL HISTORY: Past Surgical History:  Procedure Laterality Date  . FOOT SURGERY    . HIATAL HERNIA REPAIR    . OTHER SURGICAL HISTORY     Cataract Surgery--Both eyes  . Undescended testes Right 1968    FAMILY HISTORY:  Family History  Problem Relation Age of Onset  . Breast cancer Mother   . Breast cancer Sister     SOCIAL HISTORY:  Social History   Tobacco Use  . Smoking status: Never Smoker  . Smokeless tobacco:  Never Used  Substance Use Topics  . Alcohol use: Yes    Alcohol/week: 4.0 standard drinks    Types: 4 drink(s) per week    Comment: 3-4 drinks per week.   . Drug use: No    ALLERGIES: No Known Allergies  MEDICATIONS:  Current  Outpatient Medications  Medication Sig Dispense Refill  . acetaminophen (TYLENOL) 500 MG tablet Take 500 mg by mouth every 6 (six) hours as needed.    Marland Kitchen acyclovir (ZOVIRAX) 400 MG tablet Take 2 tablets (800 mg total) by mouth 2 (two) times daily. 180 tablet 11  . calcium carbonate (TUMS - DOSED IN MG ELEMENTAL CALCIUM) 500 MG chewable tablet Chew 1 tablet by mouth 2 (two) times daily.    . cholecalciferol (VITAMIN D3) 25 MCG (1000 UT) tablet Take 2,000 Units by mouth daily.    . diclofenac sodium (VOLTAREN) 1 % GEL Apply 2 g topically 4 (four) times daily as needed.    . folic acid (FOLVITE) 1 MG tablet Take 1 mg by mouth daily.    Marland Kitchen lisinopril-hydrochlorothiazide (ZESTORETIC) 20-12.5 MG tablet Take 1 tablet by mouth daily. 90 tablet 3  . loperamide (IMODIUM A-D) 2 MG tablet Take 2 mg by mouth 4 (four) times daily as needed.    . Multiple Vitamin (MULTI-VITAMIN) tablet Take by mouth.    . ondansetron (ZOFRAN) 8 MG tablet Take 1 tablet (8 mg total) by mouth 2 (two) times daily as needed (Nausea or vomiting). 30 tablet 1  . prochlorperazine (COMPAZINE) 10 MG tablet Take 1 tablet (10 mg total) by mouth every 6 (six) hours as needed (Nausea or vomiting). 30 tablet 1  . sulfamethoxazole-trimethoprim (BACTRIM DS) 800-160 MG tablet Take 1 tablet by mouth 3 (three) times a week. 12 tablet 11  . traMADol (ULTRAM) 50 MG tablet Take 1 tablet (50 mg total) by mouth every 6 (six) hours as needed. 60 tablet 0  . vitamin B-12 (CYANOCOBALAMIN) 1000 MCG tablet Take 1,000 mcg by mouth daily.     No current facility-administered medications for this encounter.    REVIEW OF SYSTEMS:  A 10+ POINT REVIEW OF SYSTEMS WAS OBTAINED including neurology, dermatology, psychiatry, cardiac, respiratory, lymph, extremities, GI, GU, musculoskeletal, constitutional, reproductive, HEENT. He denies pain, bowel or bladder issues, numbness or weakness in his extremities, and difficulty walking. He reports numbness to his left  buttock.   PHYSICAL EXAM:  height is '5\' 8"'  (1.727 m) and weight is 209 lb (94.8 kg). His temporal temperature is 98.2 F (36.8 C). His blood pressure is 139/97 (abnormal) and his pulse is 112 (abnormal). His respiration is 18 and oxygen saturation is 97%.   General: Alert and oriented, in no acute distress HEENT: Head is normocephalic. Extraocular movements are intact.  Neck: Neck is supple, no palpable cervical or supraclavicular lymphadenopathy. Heart: Regular in rate and rhythm with no murmurs, rubs, or gallops. Chest: Clear to auscultation bilaterally, with no rhonchi, wheezes, or rales. Abdomen: Soft, nontender, nondistended, with no rigidity or guarding. Extremities: No cyanosis or edema. Lymphatics: see Neck Exam Skin: No concerning lesions. Musculoskeletal: symmetric strength and muscle tone throughout. Neurologic: Cranial nerves II through XII are grossly intact. No obvious focalities. Speech is fluent. Coordination is intact. Psychiatric: Judgment and insight are intact. Affect is appropriate.   ECOG = 1  0 - Asymptomatic (Fully active, able to carry on all predisease activities without restriction)  1 - Symptomatic but completely ambulatory (Restricted in physically strenuous activity but ambulatory and able to carry out  work of a light or sedentary nature. For example, light housework, office work)  2 - Symptomatic, <50% in bed during the day (Ambulatory and capable of all self care but unable to carry out any work activities. Up and about more than 50% of waking hours)  3 - Symptomatic, >50% in bed, but not bedbound (Capable of only limited self-care, confined to bed or chair 50% or more of waking hours)  4 - Bedbound (Completely disabled. Cannot carry on any self-care. Totally confined to bed or chair)  5 - Death   Eustace Pen MM, Creech RH, Tormey DC, et al. [id number]). "Toxicity and response criteria of the [facility]". [place] Oncol. 5 (6):  649-55  LABORATORY DATA:  Lab Results  Component Value Date   WBC 6.9 10/11/2019   HGB 14.4 10/11/2019   HCT 42.0 10/11/2019   MCV 100.2 (H) 10/11/2019   PLT 208 10/11/2019   NEUTROABS 3.2 10/11/2019   Lab Results  Component Value Date   NA 140 10/11/2019   K 4.1 10/11/2019   CL 104 10/11/2019   CO2 27 10/11/2019   GLUCOSE 92 10/11/2019   CREATININE 0.78 10/11/2019   CALCIUM 8.3 (L) 10/11/2019      RADIOGRAPHY: No results found.    IMPRESSION: Multiple Myeloma  Recent imaging shows persistent disease at L5.  Patient has significant changes to the bone in this area as well as S1 and S2 on the left side.  He would be a good candidate for  radiation therapy directed at this area.  Today, I talked to the patient  about the findings and work-up thus far.  We discussed the natural history of multiple myeloma and general treatment, highlighting the role of radiotherapy in the management.  We discussed the available radiation techniques, and focused on the details of logistics and delivery.  We reviewed the anticipated acute and late sequelae associated with radiation in this setting.  The patient was encouraged to ask questions that I answered to the best of my ability.  A patient consent form was discussed and signed.  We retained a copy for our records.  The patient would like to proceed with radiation and will be scheduled for CT simulation.  PLAN: The patient will return tomorrow for CT simulation with treatments to begin next week.  Anticipate between 10 and 15 treatments directed at the L5-S2 area.    ------------------------------------------------  Blair Promise, PhD, MD  This document serves as a record of services personally performed by Gery Pray, MD. It was created on his behalf by Wilburn Mylar, a trained medical scribe. The creation of this record is based on the scribe's personal observations and the provider's statements to them. This document has been checked  and approved by the attending provider.

## 2019-12-13 NOTE — Progress Notes (Signed)
Histology and Location of Primary Cancer: DIAGNOSIS: IgG kappa myeloma with unknown cytogenetics, Stage I by ISS criteria   Date of diagnosis: 04/04/2019  Disease status: PR 1 at transplant.   Current therapy: none  Transplant: Melphalan 200 mg/m2 08/16/19 followed by autologous stem cell rescue on 08/17/2019.    Location(s) of Symptomatic Metastases: Dr. Norma Fredrickson wants Jesse Mcclure to get radiation to the L5 lesion that is still active on PET   Past/Anticipated chemotherapy by medical oncology, if any: Per Dr. Madolyn Frieze 11/29/19: Recommended that Jesse Mcclure receive radiation to this area with reassessment of myeloma markers 3-4 weeks post treatment. If no detectable disease by serology after radiation, will plan maintenance therapy with lenalidomide 10 mg daily, 21 of 28 days.    Pain on a scale of 0-10 is: Pt denies c/o pain.   If Spine Met(s), symptoms, if any, include:  Bowel/Bladder retention or incontinence (please describe): Pt denies.  Numbness or weakness in extremities (please describe): Pt denies in extremities. Pt reports numbness is left buttock.  Current Decadron regimen, if applicable: No  Ambulatory status? Walker? Wheelchair?: Pt with steady gait without assistive device.  SAFETY ISSUES:  Prior radiation? no  Pacemaker/ICD? no  Possible current pregnancy? N/A  Is the patient on methotrexate? no  Current Complaints / other details:  Pt presents today for initial consult with Dr. Sondra Come for Radiation Oncology. Pt is accompanied by partner.  BP (!) 139/97 (BP Location: Left Arm, Patient Position: Sitting)   Pulse (!) 112   Temp 98.2 F (36.8 C) (Temporal)   Resp 18   Ht _0  (1.727 m)   Wt 209 lb (94.8 kg)   SpO2 97%   BMI 31.78 kg/m   Wt Readings from Last 3 Encounters:  12/13/19 209 lb (94.8 kg)  10/11/19 202 lb 8 oz (91.9 kg)  09/06/19 205 lb 8 oz (93.2 kg)   Loma Sousa, RN BSN

## 2019-12-13 NOTE — Patient Instructions (Signed)
Coronavirus (COVID-19) Are you at risk?  Are you at risk for the Coronavirus (COVID-19)?  To be considered HIGH RISK for Coronavirus (COVID-19), you have to meet the following criteria:  . Traveled to [place], [place], [place], [place] or [place]; or in the [place] to [place], [place], [place], or [place]; and have fever, cough, and shortness of breath within the last 2 weeks of travel OR . Been in close contact with a person diagnosed with COVID-19 within the last 2 weeks and have fever, cough, and shortness of breath . IF YOU DO NOT MEET THESE CRITERIA, YOU ARE CONSIDERED LOW RISK FOR COVID-19.  What to do if you are HIGH RISK for COVID-19?  . If you are having a medical emergency, call 911. . Seek medical care right away. Before you go to a doctor's office, urgent care or emergency department, call ahead and tell them about your recent travel, contact with someone diagnosed with COVID-19, and your symptoms. You should receive instructions from your physician's office regarding next steps of care.  . When you arrive at healthcare provider, tell the healthcare staff immediately you have returned from visiting [place], [place], [place], [place] or [place]; or traveled in the [place] to [place], [place], [place], or [place]; in the last two weeks or you have been in close contact with a person diagnosed with COVID-19 in the last 2 weeks.   . Tell the health care staff about your symptoms: fever, cough and shortness of breath. . After you have been seen by a medical provider, you will be either: o Tested for (COVID-19) and discharged home on quarantine except to seek medical care if symptoms worsen, and asked to  - Stay home and avoid contact with others until you get your results (4-5 days)  - Avoid travel on public transportation if possible (such as bus, train, or airplane) or o Sent to the Emergency Department by EMS for evaluation, COVID-19 testing, and possible  admission depending on your condition and test results.  What to do if you are LOW RISK for COVID-19?  Reduce your risk of any infection by using the same precautions used for avoiding the common cold or flu:  . Wash your hands often with soap and warm water for at least 20 seconds.  If soap and water are not readily available, use an alcohol-based hand sanitizer with at least 60% alcohol.  . If coughing or sneezing, cover your mouth and nose by coughing or sneezing into the elbow areas of your shirt or coat, into a tissue or into your sleeve (not your hands). . Avoid shaking hands with others and consider head nods or verbal greetings only. . Avoid touching your eyes, nose, or mouth with unwashed hands.  . Avoid close contact with people who are sick. . Avoid places or events with large numbers of people in one location, like concerts or sporting events. . Carefully consider travel plans you have or are making. . If you are planning any travel outside or inside the [place], visit the CDC's Travelers' Health webpage for the latest health notices. . If you have some symptoms but not all symptoms, continue to monitor at home and seek medical attention if your symptoms worsen. . If you are having a medical emergency, call 911.   ADDITIONAL HEALTHCARE OPTIONS FOR PATIENTS  [facility] / e-Visit: [email or web address]         [facility]: [phone]  [facility]: [phone]                   [facility]: [phone]   

## 2019-12-14 ENCOUNTER — Ambulatory Visit
Admission: RE | Admit: 2019-12-14 | Discharge: 2019-12-14 | Disposition: A | Discharge status: Home / Self Care | Payer: Medicare Other | Source: Ambulatory Visit | Attending: Radiation Oncology | Admitting: Radiation Oncology

## 2019-12-14 ENCOUNTER — Other Ambulatory Visit: Payer: Self-pay

## 2019-12-14 DIAGNOSIS — C9000 Multiple myeloma not having achieved remission: Secondary | ICD-10-CM | POA: Insufficient documentation

## 2019-12-14 DIAGNOSIS — Z510 Encounter for antineoplastic radiation therapy: Secondary | ICD-10-CM | POA: Insufficient documentation

## 2019-12-14 NOTE — Progress Notes (Incomplete)
?  Radiation Oncology         [phone]) [phone] ?________________________________ ? ?Name: Jesse Mcclure. MRN: [id number]  ?Date: 12/14/2019  DOB: 1951-07-20 ? ?SIMULATION AND TREATMENT PLANNING NOTE ? ?No diagnosis found. ? ?DIAGNOSIS:  Multiple myeloma ? ?NARRATIVE:  The patient was brought to the [facility].  Identity was confirmed.  All relevant records and images related to the planned course of therapy were reviewed.  The patient freely provided informed written consent to proceed with treatment after reviewing the details related to the planned course of therapy. The consent form was witnessed and verified by the simulation staff.  Then, the patient was set-up in a stable reproducible  supine position for radiation therapy.  CT images were obtained.  Surface markings were placed.  The CT images were loaded into the planning software.  Then the target and avoidance structures were contoured.  Treatment planning then occurred.  The radiation prescription was entered and confirmed.  Then, I designed and supervised the construction of a total of *** medically necessary complex treatment devices.  I have requested : {CHL RAD ONC TX CHOICES:22734}.  I have ordered:{CHL RAD ONC TX PLAN POST UIQNVV:87215} ? ?PLAN:  The patient will receive *** Gy in *** fractions. ? ?----------------------------------- ? ?Blair Promise, PhD, MD ? ?This document serves as a record of services personally performed by Gery Pray, MD. It was created on his behalf by Wilburn Mylar, a trained medical scribe. The creation of this record is based on the scribe's personal observations and the provider's statements to them. This document has been checked and approved by the attending provider. ? ?

## 2019-12-15 DIAGNOSIS — C9000 Multiple myeloma not having achieved remission: Secondary | ICD-10-CM | POA: Diagnosis not present

## 2019-12-15 DIAGNOSIS — Z510 Encounter for antineoplastic radiation therapy: Secondary | ICD-10-CM | POA: Diagnosis not present

## 2019-12-20 ENCOUNTER — Other Ambulatory Visit: Payer: Self-pay

## 2019-12-20 ENCOUNTER — Ambulatory Visit
Admission: RE | Admit: 2019-12-20 | Discharge: 2019-12-20 | Disposition: A | Discharge status: Home / Self Care | Payer: Medicare Other | Source: Ambulatory Visit | Attending: Radiation Oncology | Admitting: Radiation Oncology

## 2019-12-20 DIAGNOSIS — C9000 Multiple myeloma not having achieved remission: Secondary | ICD-10-CM

## 2019-12-20 DIAGNOSIS — Z510 Encounter for antineoplastic radiation therapy: Secondary | ICD-10-CM | POA: Diagnosis not present

## 2019-12-20 NOTE — Progress Notes (Signed)
  Radiation Oncology         [phone]) [phone] ________________________________  Name: Jesse Mcclure. MRN: [id number]  Date: 12/20/2019  DOB: [birth year]  Simulation Verification Note    ICD-10-CM   1. Multiple myeloma without remission ([facility])  C90.00     NARRATIVE: The patient was brought to the treatment unit and placed in the planned treatment position. The clinical setup was verified. Then port films were obtained and uploaded to the radiation oncology medical record software.  The treatment beams were carefully compared against the planned radiation fields. The position location and shape of the radiation fields was reviewed. They targeted volume of tissue appears to be appropriately covered by the radiation beams. Organs at risk appear to be excluded as planned.  Based on my personal review, I approved the simulation verification. The patient's treatment will proceed as planned.  -----------------------------------  Blair Promise, PhD, MD  This document serves as a record of services personally performed by Gery Pray, MD. It was created on his behalf by Clerance Lav, a trained medical scribe. The creation of this record is based on the scribe's personal observations and the provider's statements to them. This document has been checked and approved by the attending provider.

## 2019-12-21 ENCOUNTER — Other Ambulatory Visit: Payer: Self-pay

## 2019-12-21 ENCOUNTER — Ambulatory Visit
Admission: RE | Admit: 2019-12-21 | Discharge: 2019-12-21 | Disposition: A | Discharge status: Home / Self Care | Payer: Medicare Other | Source: Ambulatory Visit | Attending: Radiation Oncology | Admitting: Radiation Oncology

## 2019-12-21 DIAGNOSIS — C9000 Multiple myeloma not having achieved remission: Secondary | ICD-10-CM | POA: Diagnosis not present

## 2019-12-21 DIAGNOSIS — Z510 Encounter for antineoplastic radiation therapy: Secondary | ICD-10-CM | POA: Diagnosis not present

## 2019-12-22 ENCOUNTER — Other Ambulatory Visit: Payer: Self-pay

## 2019-12-22 ENCOUNTER — Ambulatory Visit
Admission: RE | Admit: 2019-12-22 | Discharge: 2019-12-22 | Disposition: A | Discharge status: Home / Self Care | Payer: Medicare Other | Source: Ambulatory Visit | Attending: Radiation Oncology | Admitting: Radiation Oncology

## 2019-12-22 DIAGNOSIS — Z510 Encounter for antineoplastic radiation therapy: Secondary | ICD-10-CM | POA: Diagnosis not present

## 2019-12-22 DIAGNOSIS — C9000 Multiple myeloma not having achieved remission: Secondary | ICD-10-CM | POA: Diagnosis not present

## 2019-12-25 ENCOUNTER — Other Ambulatory Visit: Payer: Self-pay

## 2019-12-25 ENCOUNTER — Ambulatory Visit
Admission: RE | Admit: 2019-12-25 | Discharge: 2019-12-25 | Disposition: A | Discharge status: Home / Self Care | Payer: Medicare Other | Source: Ambulatory Visit | Attending: Radiation Oncology | Admitting: Radiation Oncology

## 2019-12-25 DIAGNOSIS — Z510 Encounter for antineoplastic radiation therapy: Secondary | ICD-10-CM | POA: Diagnosis not present

## 2019-12-25 DIAGNOSIS — C9000 Multiple myeloma not having achieved remission: Secondary | ICD-10-CM | POA: Diagnosis not present

## 2019-12-26 ENCOUNTER — Ambulatory Visit
Admission: RE | Admit: 2019-12-26 | Discharge: 2019-12-26 | Disposition: A | Discharge status: Home / Self Care | Payer: Medicare Other | Source: Ambulatory Visit | Attending: Radiation Oncology | Admitting: Radiation Oncology

## 2019-12-26 ENCOUNTER — Telehealth: Payer: Self-pay

## 2019-12-26 ENCOUNTER — Other Ambulatory Visit: Payer: Self-pay

## 2019-12-26 DIAGNOSIS — C9000 Multiple myeloma not having achieved remission: Secondary | ICD-10-CM | POA: Diagnosis not present

## 2019-12-26 DIAGNOSIS — Z510 Encounter for antineoplastic radiation therapy: Secondary | ICD-10-CM | POA: Diagnosis not present

## 2019-12-26 NOTE — Telephone Encounter (Signed)
Called and left below message. Ask him to call the office back. ?

## 2019-12-26 NOTE — Telephone Encounter (Signed)
He called back and left a message. He is doing great, no complaints. He finished his 5 th radiation appt today. Radiation is going great and thanks for the referral to Dr. Sondra Come. He is scheduled to see Dr. Norma Fredrickson on 5/12 for labs and vaccines. Thanks for checking on him.

## 2019-12-26 NOTE — Telephone Encounter (Signed)
-----   Message from Heath Lark, MD sent at 12/26/2019 10:44 AM EDT ----- Regarding: can you call and ask how he is doing? I have not made f/up appt because Dr. Norma Fredrickson from [facility] said he will arrange f/up

## 2019-12-27 ENCOUNTER — Other Ambulatory Visit: Payer: Self-pay

## 2019-12-27 ENCOUNTER — Ambulatory Visit
Admission: RE | Admit: 2019-12-27 | Discharge: 2019-12-27 | Disposition: A | Discharge status: Home / Self Care | Payer: Medicare Other | Source: Ambulatory Visit | Attending: Radiation Oncology | Admitting: Radiation Oncology

## 2019-12-27 DIAGNOSIS — C9000 Multiple myeloma not having achieved remission: Secondary | ICD-10-CM | POA: Diagnosis not present

## 2019-12-27 DIAGNOSIS — Z510 Encounter for antineoplastic radiation therapy: Secondary | ICD-10-CM | POA: Diagnosis not present

## 2019-12-28 ENCOUNTER — Other Ambulatory Visit: Payer: Self-pay

## 2019-12-28 ENCOUNTER — Ambulatory Visit
Admission: RE | Admit: 2019-12-28 | Discharge: 2019-12-28 | Disposition: A | Discharge status: Home / Self Care | Payer: Medicare Other | Source: Ambulatory Visit | Attending: Radiation Oncology | Admitting: Radiation Oncology

## 2019-12-28 DIAGNOSIS — C9000 Multiple myeloma not having achieved remission: Secondary | ICD-10-CM | POA: Diagnosis not present

## 2019-12-28 DIAGNOSIS — Z510 Encounter for antineoplastic radiation therapy: Secondary | ICD-10-CM | POA: Diagnosis not present

## 2019-12-29 ENCOUNTER — Telehealth: Payer: Self-pay

## 2019-12-29 ENCOUNTER — Other Ambulatory Visit: Payer: Self-pay | Admitting: Hematology and Oncology

## 2019-12-29 ENCOUNTER — Ambulatory Visit
Admission: RE | Admit: 2019-12-29 | Discharge: 2019-12-29 | Disposition: A | Discharge status: Home / Self Care | Payer: Medicare Other | Source: Ambulatory Visit | Attending: Radiation Oncology | Admitting: Radiation Oncology

## 2019-12-29 ENCOUNTER — Other Ambulatory Visit: Payer: Self-pay

## 2019-12-29 DIAGNOSIS — C9000 Multiple myeloma not having achieved remission: Secondary | ICD-10-CM | POA: Diagnosis not present

## 2019-12-29 DIAGNOSIS — Z510 Encounter for antineoplastic radiation therapy: Secondary | ICD-10-CM | POA: Diagnosis not present

## 2019-12-29 MED ORDER — TRAMADOL HCL 50 MG PO TABS: 50.0000 mg | ORAL_TABLET | Freq: Four times a day (QID) | ORAL | 0 refills | Status: DC | PRN

## 2019-12-29 NOTE — Telephone Encounter (Signed)
Called and requested refill on Tramadol Rx.

## 2019-12-29 NOTE — Telephone Encounter (Signed)
Called and left a message. Rx sent to pharmacy by Dr. Lindi Adie and a reminder to not call on Fridays for a refill. Ask him to call for questions.

## 2020-01-01 ENCOUNTER — Other Ambulatory Visit: Payer: Self-pay

## 2020-01-01 ENCOUNTER — Ambulatory Visit
Admission: RE | Admit: 2020-01-01 | Discharge: 2020-01-01 | Disposition: A | Discharge status: Home / Self Care | Payer: Medicare Other | Source: Ambulatory Visit | Attending: Radiation Oncology | Admitting: Radiation Oncology

## 2020-01-01 DIAGNOSIS — C9000 Multiple myeloma not having achieved remission: Secondary | ICD-10-CM | POA: Diagnosis not present

## 2020-01-01 DIAGNOSIS — Z510 Encounter for antineoplastic radiation therapy: Secondary | ICD-10-CM | POA: Diagnosis not present

## 2020-01-02 ENCOUNTER — Ambulatory Visit
Admission: RE | Admit: 2020-01-02 | Discharge: 2020-01-02 | Disposition: A | Discharge status: Home / Self Care | Payer: Medicare Other | Source: Ambulatory Visit | Attending: Radiation Oncology | Admitting: Radiation Oncology

## 2020-01-02 ENCOUNTER — Other Ambulatory Visit: Payer: Self-pay

## 2020-01-02 DIAGNOSIS — C9000 Multiple myeloma not having achieved remission: Secondary | ICD-10-CM | POA: Diagnosis not present

## 2020-01-02 DIAGNOSIS — Z510 Encounter for antineoplastic radiation therapy: Secondary | ICD-10-CM | POA: Diagnosis not present

## 2020-01-03 ENCOUNTER — Ambulatory Visit
Admission: RE | Admit: 2020-01-03 | Discharge: 2020-01-03 | Disposition: A | Discharge status: Home / Self Care | Payer: Medicare Other | Source: Ambulatory Visit | Attending: Radiation Oncology | Admitting: Radiation Oncology

## 2020-01-03 ENCOUNTER — Other Ambulatory Visit: Payer: Self-pay

## 2020-01-03 DIAGNOSIS — C9000 Multiple myeloma not having achieved remission: Secondary | ICD-10-CM | POA: Diagnosis not present

## 2020-01-03 DIAGNOSIS — Z510 Encounter for antineoplastic radiation therapy: Secondary | ICD-10-CM | POA: Diagnosis not present

## 2020-01-04 ENCOUNTER — Ambulatory Visit
Admission: RE | Admit: 2020-01-04 | Discharge: 2020-01-04 | Disposition: A | Discharge status: Home / Self Care | Payer: Medicare Other | Source: Ambulatory Visit | Attending: Radiation Oncology | Admitting: Radiation Oncology

## 2020-01-04 ENCOUNTER — Other Ambulatory Visit: Payer: Self-pay

## 2020-01-04 DIAGNOSIS — Z510 Encounter for antineoplastic radiation therapy: Secondary | ICD-10-CM | POA: Diagnosis not present

## 2020-01-04 DIAGNOSIS — C9000 Multiple myeloma not having achieved remission: Secondary | ICD-10-CM | POA: Insufficient documentation

## 2020-01-05 ENCOUNTER — Other Ambulatory Visit: Payer: Self-pay

## 2020-01-05 ENCOUNTER — Ambulatory Visit
Admission: RE | Admit: 2020-01-05 | Discharge: 2020-01-05 | Disposition: A | Discharge status: Home / Self Care | Payer: Medicare Other | Source: Ambulatory Visit | Attending: Radiation Oncology | Admitting: Radiation Oncology

## 2020-01-05 DIAGNOSIS — C9000 Multiple myeloma not having achieved remission: Secondary | ICD-10-CM | POA: Diagnosis not present

## 2020-01-05 DIAGNOSIS — Z510 Encounter for antineoplastic radiation therapy: Secondary | ICD-10-CM | POA: Diagnosis not present

## 2020-01-08 ENCOUNTER — Ambulatory Visit
Admission: RE | Admit: 2020-01-08 | Discharge: 2020-01-08 | Disposition: A | Discharge status: Home / Self Care | Payer: Medicare Other | Source: Ambulatory Visit | Attending: Radiation Oncology | Admitting: Radiation Oncology

## 2020-01-08 ENCOUNTER — Other Ambulatory Visit: Payer: Self-pay

## 2020-01-08 ENCOUNTER — Encounter: Payer: Self-pay | Admitting: Radiation Oncology

## 2020-01-08 DIAGNOSIS — Z510 Encounter for antineoplastic radiation therapy: Secondary | ICD-10-CM | POA: Diagnosis not present

## 2020-01-08 DIAGNOSIS — C9000 Multiple myeloma not having achieved remission: Secondary | ICD-10-CM | POA: Diagnosis not present

## 2020-01-23 ENCOUNTER — Telehealth: Payer: Self-pay

## 2020-01-23 NOTE — Telephone Encounter (Signed)
Returned call to patient. He is asking about ordering PET scan. Dr. Sondra Come mentioned that Dr. Alvy Bimler would be ordering a PET scan.

## 2020-01-24 NOTE — Telephone Encounter (Signed)
The last time I spoke with his transplant physician from [facility] he said he will order it and follow-up at [facility]. Can he call his BMT team coordinator and double check?

## 2020-01-24 NOTE — Telephone Encounter (Signed)
Called and left below message. Ask him to call the office back.  [place] and left a message with office staff. Asking it they would be ordering PET scan? Ask for a call back.

## 2020-01-25 NOTE — Telephone Encounter (Signed)
[facility] called back. Dr. Norma Fredrickson office called back, he will order the PET scan when it is needed.  Called Mr. Ohlmann and given above message. He verbalized understanding.

## 2020-02-07 NOTE — Progress Notes (Signed)
Jesse Mcclure presents today for 1 month follow-up after completing radiation to the lumbar spine on 01/08/2020  Pain none Fatigue none Diarrhea none Dysuria none Skin none  Vitals:   02/08/20 1013  BP: 135/87  Pulse: (!) 105  Resp: 18  Temp: 98.9 F (37.2 C)  SpO2: 96%     Wt Readings from Last 3 Encounters:  02/08/20 214 lb 4 oz (97.2 kg)  12/13/19 209 lb (94.8 kg)  10/11/19 202 lb 8 oz (91.9 kg)

## 2020-02-07 NOTE — Progress Notes (Incomplete)
  Patient Name: Jesse Mcclure MRN: [id number] DOB: 1950-11-23 Referring Physician: [facility] (Profile Not Attached) Date of Service: 01/08/2020 [facility], [place]                                                        End Of Treatment Note  Diagnoses: C90.00-Multiple myeloma not having achieved remission  Cancer Staging: Multiple myeloma without remission  Intent: Curative  Radiation Treatment Dates: 12/20/2019 through 01/08/2020 Site Technique Total Dose (Gy) Dose per Fx (Gy) Completed Fx Beam Energies  Lumbar Spine: Spine 3D 35/35 2.5 14/14 15X   Narrative: The patient tolerated radiation therapy relatively well. He did report mild fatigue, some nausea at the beginning of treatment that resolved with Compazine, and some diarrhea towards the end of treatment that was not worrisome enough for Imodium. He denied bladder complaints and skin changes. The patient reported significant improvement in pain but continued to report numbness.  Plan: The patient will follow-up with radiation oncology in one month.  ________________________________________________   Blair Promise, PhD, MD  This document serves as a record of services personally performed by Gery Pray, MD. It was created on his behalf by Clerance Lav, a trained medical scribe. The creation of this record is based on the scribe's personal observations and the provider's statements to them. This document has been checked and approved by the attending provider.

## 2020-02-07 NOTE — Progress Notes (Signed)
Radiation Oncology         [id number]) [phone] ________________________________  Name: Jesse Mcclure. MRN: [id number]  Date: 02/08/2020  DOB: [birth year]  Follow-Up Visit Note  CC: Denita Lung, MD  Heath Lark, MD    ICD-10-CM   1. Multiple myeloma without remission [facility])  C90.00     Diagnosis: Multiple myeloma without remission  Interval Since Last Radiation: One month and one day.  Radiation Treatment Dates: 12/20/2019 through 01/08/2020 Site Technique Total Dose (Gy) Dose per Fx (Gy) Completed Fx Beam Energies  Lumbar Spine: Spine 3D 35/35 2.5 14/14 15X    Narrative:  Jesse Mcclure returns today for routine follow-up. No significant interval history since Jesse end of treatment. Dr. Norma Fredrickson at [facility] will order Jesse Mcclure's next PET scan when it is needed.  On review of systems, Jesse Mcclure reports no complaints. Jesse Mcclure denies pain, fatigue, diarrhea, dysuria, and skin changes.  ALLERGIES:  has No Known Allergies.  Meds: Current Outpatient Medications  Medication Sig Dispense Refill  . acetaminophen (TYLENOL) 500 MG tablet Take 500 mg by mouth every 6 (six) hours as needed.    Marland Kitchen acyclovir (ZOVIRAX) 400 MG tablet Take 2 tablets (800 mg total) by mouth 2 (two) times daily. 180 tablet 11  . calcium carbonate (TUMS - DOSED IN MG ELEMENTAL CALCIUM) 500 MG chewable tablet Chew 1 tablet by mouth 2 (two) times daily.    . cholecalciferol (VITAMIN D3) 25 MCG (1000 UT) tablet Take 2,000 Units by mouth daily.    . diclofenac sodium (VOLTAREN) 1 % GEL Apply 2 g topically 4 (four) times daily as needed.    . folic acid (FOLVITE) 1 MG tablet Take 1 mg by mouth daily.    Marland Kitchen lisinopril-hydrochlorothiazide (ZESTORETIC) 20-12.5 MG tablet Take 1 tablet by mouth daily. 90 tablet 3  . loperamide (IMODIUM A-D) 2 MG tablet Take 2 mg by mouth 4 (four) times daily as needed.    . Multiple Vitamin (MULTI-VITAMIN) tablet Take by mouth.    . ondansetron (ZOFRAN) 8 MG tablet Take 1 tablet (8 mg  total) by mouth 2 (two) times daily as needed (Nausea or vomiting). 30 tablet 1  . prochlorperazine (COMPAZINE) 10 MG tablet Take 1 tablet (10 mg total) by mouth every 6 (six) hours as needed (Nausea or vomiting). 30 tablet 1  . sulfamethoxazole-trimethoprim (BACTRIM DS) 800-160 MG tablet Take 1 tablet by mouth 3 (three) times a week. 12 tablet 11  . traMADol (ULTRAM) 50 MG tablet Take 1 tablet (50 mg total) by mouth every 6 (six) hours as needed. 60 tablet 0  . vitamin B-12 (CYANOCOBALAMIN) 1000 MCG tablet Take 1,000 mcg by mouth daily.     No current facility-administered medications for this encounter.    Physical Findings: Jesse Mcclure is in no acute distress. Mcclure is alert and oriented.  height is '5\' 8"'  (1.727 m) and weight is 214 lb 4 oz (97.2 kg). His temporal temperature is 98.9 F (37.2 C). His blood pressure is 135/87 and his pulse is 105 (abnormal). His respiration is 18 and oxygen saturation is 96%.   No significant changes. Lungs are clear to auscultation bilaterally. Heart has regular rate and rhythm. No palpable cervical, supraclavicular, or axillary adenopathy. Abdomen soft, non-tender, normal bowel sounds.   Lab Findings: Lab Results  Component Value Date   WBC 6.9 10/11/2019   HGB 14.4 10/11/2019   HCT 42.0 10/11/2019   MCV 100.2 (H) 10/11/2019   PLT 208 10/11/2019  Radiographic Findings: No results found.  Impression: Multiple myeloma without remission  Jesse Mcclure tolerated his radiation therapy quite well without any significant side effects.  Jesse Mcclure denies any pain in Jesse lower back at this time.  Plan: Jesse Mcclure is scheduled to see Dr. Norma Fredrickson on 02/14/2020. Follow-up with radiation oncology in an as-needed basis.  ____________________________________   Blair Promise, PhD, MD  This document serves as a record of services personally performed by Gery Pray, MD. It was created on his behalf by Clerance Lav, a trained medical scribe. Jesse creation of  this record is based on Jesse scribe's personal observations and Jesse provider's statements to them. This document has been checked and approved by Jesse attending provider.

## 2020-02-08 ENCOUNTER — Ambulatory Visit
Admission: RE | Admit: 2020-02-08 | Discharge: 2020-02-08 | Disposition: A | Discharge status: Home / Self Care | Payer: Medicare Other | Source: Ambulatory Visit | Attending: Radiation Oncology | Admitting: Radiation Oncology

## 2020-02-08 ENCOUNTER — Encounter: Payer: Self-pay | Admitting: Radiation Oncology

## 2020-02-08 ENCOUNTER — Other Ambulatory Visit: Payer: Self-pay

## 2020-02-08 VITALS — BP 135/87 | HR 105 | Temp 98.9°F | Resp 18 | Ht 68.0 in | Wt 214.2 lb

## 2020-02-08 DIAGNOSIS — Z923 Personal history of irradiation: Secondary | ICD-10-CM | POA: Diagnosis not present

## 2020-02-08 DIAGNOSIS — C9000 Multiple myeloma not having achieved remission: Secondary | ICD-10-CM | POA: Diagnosis not present

## 2020-02-08 DIAGNOSIS — Z79899 Other long term (current) drug therapy: Secondary | ICD-10-CM | POA: Diagnosis not present

## 2020-02-14 DIAGNOSIS — C9000 Multiple myeloma not having achieved remission: Secondary | ICD-10-CM | POA: Diagnosis not present

## 2020-02-14 DIAGNOSIS — Z23 Encounter for immunization: Secondary | ICD-10-CM | POA: Diagnosis not present

## 2020-02-14 DIAGNOSIS — E538 Deficiency of other specified B group vitamins: Secondary | ICD-10-CM | POA: Diagnosis not present

## 2020-02-14 DIAGNOSIS — Z7982 Long term (current) use of aspirin: Secondary | ICD-10-CM | POA: Diagnosis not present

## 2020-02-14 DIAGNOSIS — D72822 Plasmacytosis: Secondary | ICD-10-CM | POA: Diagnosis not present

## 2020-02-14 DIAGNOSIS — Z792 Long term (current) use of antibiotics: Secondary | ICD-10-CM | POA: Diagnosis not present

## 2020-02-14 DIAGNOSIS — G629 Polyneuropathy, unspecified: Secondary | ICD-10-CM | POA: Diagnosis not present

## 2020-02-14 DIAGNOSIS — Z923 Personal history of irradiation: Secondary | ICD-10-CM | POA: Diagnosis not present

## 2020-02-14 DIAGNOSIS — Z9484 Stem cells transplant status: Secondary | ICD-10-CM | POA: Diagnosis not present

## 2020-02-29 DIAGNOSIS — M898X8 Other specified disorders of bone, other site: Secondary | ICD-10-CM | POA: Diagnosis not present

## 2020-02-29 DIAGNOSIS — C9000 Multiple myeloma not having achieved remission: Secondary | ICD-10-CM | POA: Diagnosis not present

## 2020-03-06 ENCOUNTER — Telehealth: Payer: Self-pay

## 2020-03-06 ENCOUNTER — Telehealth: Payer: Self-pay | Admitting: *Deleted

## 2020-03-06 NOTE — Telephone Encounter (Signed)
-----   Message from Heath Lark, MD sent at 03/06/2020  7:26 AM EDT ----- Regarding: RE: Results I cannot see the images since it is done at [facility] When is his appt to see [facility] hematologist? They are suppose to go through results with him By report things look better ----- Message ----- From: Lennie Odor, LPN Sent: [id number]   2:17 PM EDT To: Heath Lark, MD Subject: Results                                        Pt called and wants to know if you can call him to go over results from PET scan performed by [facility]. Pt states it resulted to his mychart but he does not understand how to read it. Please advise.

## 2020-03-06 NOTE — Telephone Encounter (Signed)
Followed up with pt about PET scan results that was done at [facility]. Pt wanted to know if Dr.Gorsuch could read them, but due to her not being able to view them, she recommended that pt call hematologist at [facility] to go over scans. Pt was appreciative and verbalized understanding.

## 2020-03-06 NOTE — Telephone Encounter (Signed)
Called pt to inform him we are unable to see images, therefore not able to gvie him a report. Advised pt to contact his nurse navigator at [facility] to expedite results being read. Pt verbalized thanks and understanding.

## 2020-03-07 ENCOUNTER — Telehealth: Payer: Self-pay

## 2020-03-07 NOTE — Telephone Encounter (Signed)
Called and spoke with him to find out when his next appt is at [facility]. Next appt is scheduled on 8/11 at 1:30. Dr. Alvy Bimler would like to schedule appt after next appt at [facility] to talk about Revlimid. He verbalized understanding.

## 2020-03-08 ENCOUNTER — Telehealth: Payer: Self-pay | Admitting: Hematology and Oncology

## 2020-03-08 NOTE — Telephone Encounter (Signed)
Scheduled per 6/3 sch message. Pt aware of appt

## 2020-03-18 ENCOUNTER — Other Ambulatory Visit: Payer: Self-pay | Admitting: Hematology and Oncology

## 2020-03-18 ENCOUNTER — Telehealth: Payer: Self-pay

## 2020-03-18 ENCOUNTER — Other Ambulatory Visit: Payer: Self-pay

## 2020-03-18 DIAGNOSIS — C9000 Multiple myeloma not having achieved remission: Secondary | ICD-10-CM

## 2020-03-18 MED ORDER — ACYCLOVIR 400 MG PO TABS: 800.0000 mg | ORAL_TABLET | Freq: Two times a day (BID) | ORAL | 11 refills | Status: DC

## 2020-03-18 NOTE — Telephone Encounter (Signed)
He called and left a message to call him.  Called back. He is requesting a 30 day supply refill on Acyclovir. Rx sent.

## 2020-05-16 DIAGNOSIS — R209 Unspecified disturbances of skin sensation: Secondary | ICD-10-CM | POA: Diagnosis not present

## 2020-05-16 DIAGNOSIS — Z7982 Long term (current) use of aspirin: Secondary | ICD-10-CM | POA: Diagnosis not present

## 2020-05-16 DIAGNOSIS — C9000 Multiple myeloma not having achieved remission: Secondary | ICD-10-CM | POA: Diagnosis not present

## 2020-05-16 DIAGNOSIS — Z23 Encounter for immunization: Secondary | ICD-10-CM | POA: Diagnosis not present

## 2020-05-16 DIAGNOSIS — Z79899 Other long term (current) drug therapy: Secondary | ICD-10-CM | POA: Diagnosis not present

## 2020-05-16 DIAGNOSIS — Z792 Long term (current) use of antibiotics: Secondary | ICD-10-CM | POA: Diagnosis not present

## 2020-05-16 DIAGNOSIS — Z9484 Stem cells transplant status: Secondary | ICD-10-CM | POA: Diagnosis not present

## 2020-05-20 ENCOUNTER — Inpatient Hospital Stay: Payer: Medicare Other | Attending: Hematology and Oncology | Admitting: Hematology and Oncology

## 2020-05-20 ENCOUNTER — Other Ambulatory Visit: Payer: Self-pay

## 2020-05-20 ENCOUNTER — Encounter: Payer: Self-pay | Admitting: Hematology and Oncology

## 2020-05-20 DIAGNOSIS — Z9481 Bone marrow transplant status: Secondary | ICD-10-CM | POA: Insufficient documentation

## 2020-05-20 DIAGNOSIS — I700 Atherosclerosis of aorta: Secondary | ICD-10-CM | POA: Insufficient documentation

## 2020-05-20 DIAGNOSIS — I2510 Atherosclerotic heart disease of native coronary artery without angina pectoris: Secondary | ICD-10-CM | POA: Diagnosis not present

## 2020-05-20 DIAGNOSIS — Z79899 Other long term (current) drug therapy: Secondary | ICD-10-CM | POA: Diagnosis not present

## 2020-05-20 DIAGNOSIS — C9000 Multiple myeloma not having achieved remission: Secondary | ICD-10-CM | POA: Insufficient documentation

## 2020-05-20 DIAGNOSIS — Z23 Encounter for immunization: Secondary | ICD-10-CM | POA: Diagnosis not present

## 2020-05-20 MED ORDER — LENALIDOMIDE 20 MG PO CAPS: ORAL_CAPSULE | ORAL | 11 refills | Status: DC

## 2020-05-20 NOTE — Assessment & Plan Note (Signed)
I have reviewed documentation at [facility] to the best of my ability We will start him on maintenance treatment with Revlimid In the past, he was receiving low-dose Revlimid but for maintenance purposes, I plan to give him moderate dose of 20 mg daily for 21 days, rest 7 days for cycle every 28 days He will continue acyclovir for antimicrobial prophylaxis I remind him to take aspirin 325 mg daily for DVT prophylaxis He will continue calcium with vitamin D supplement I will schedule Xgeva to resume in his next visit We discussed the risk, benefits, side effects of maintenance treatment with Revlimid and he is in agreement to proceed

## 2020-05-20 NOTE — Assessment & Plan Note (Signed)
The patient would qualify for booster injection for Covid vaccination based on the latest CDC guidelines He will return to [facility] for further immunization post transplant in November

## 2020-05-20 NOTE — Progress Notes (Signed)
[place] OFFICE PROGRESS NOTE  Patient Care Team: Denita Lung, MD as PCP - General (Family Medicine) Heath Lark, MD as Consulting Physician (Hematology and Oncology)  ASSESSMENT & PLAN:  Multiple myeloma without remission [facility]) I have reviewed documentation at [facility] to the best of my ability We will start him on maintenance treatment with Revlimid In the past, he was receiving low-dose Revlimid but for maintenance purposes, I plan to give him moderate dose of 20 mg daily for 21 days, rest 7 days for cycle every 28 days He will continue acyclovir for antimicrobial prophylaxis I remind him to take aspirin 325 mg daily for DVT prophylaxis He will continue calcium with vitamin D supplement I will schedule Xgeva to resume in his next visit We discussed the risk, benefits, side effects of maintenance treatment with Revlimid and he is in agreement to proceed  S/P bone marrow transplant [facility]) The patient would qualify for booster injection for Covid vaccination based on the latest CDC guidelines He will return to [facility] for further immunization post transplant in November   No orders of the defined types were placed in this encounter.   All questions were answered. The patient knows to call the clinic with any problems, questions or concerns. The total time spent in the appointment was 30 minutes encounter with patients including review of chart and various tests results, discussions about plan of care and coordination of care plan   Heath Lark, MD 05/20/2020 10:50 AM  INTERVAL HISTORY: Please see below for problem oriented charting. He returns for treatment and follow-up He is doing well He denies further back pain He is not taking tramadol as needed He has gained some weight since last time I saw him No recent fever or chills I have reviewed the documentation at [facility] He is scheduled for another repeat bone marrow aspirate and biopsy He denies  recent dental issues  SUMMARY OF ONCOLOGIC HISTORY: Oncology History  Multiple myeloma without remission ([place])  03/20/2019 Imaging   1. Tumor involving the L5 and S1 and S2 segments of the spine as described with mass effects upon the left L4, L5, S1 and more distal left sacral nerves as described above. 2. Does the patient have a history of malignancy? 3. Multiple plasmacytomas and metastatic disease could give this appearance   03/27/2019 Pathology Results   Soft Tissue Needle Core Biopsy, sacral mass - PLASMABLASTIC NEOPLASM. - SEE COMMENT. Microscopic Comment The sections show needle core biopsy fragments of soft tissue densely infiltrated by a relatively monomorphic infiltrate of atypical plasmacytoid cells characterized by vesicular or partially clumped chromatin and prominent nucleoli. This is associated with scattered mitosis. A battery of immunohistochemical stains was performed and show that the atypical plasmacytoid cells are positive for CD138, CD43 and cytoplasmic kappa. There is also weak positivity for CD56 and partial variable positivity for cyclin D1. The atypical plasmacytoid cells are negative for cytoplasmic lambda, CD10, PAX5, CD79a, CD20, CD3, CD5, CD34, EBV (ISH) and mostly negative for LCA. The overall morphologic and histologic features are most compatible with a plasmablastic neoplasm. The differential diagnosis includes plasmablastic lymphoma and plasmablastic plasmacytoma/myeloma. Based on the overall phenotypic features and the clinical setting, plasmacytoma/myeloma is favored. Clinical correlation and hematologic evaluation is recommended   03/27/2019 Procedure   Technically successful CT-guided biopsy of sacral soft tissue mass.   04/04/2019 Initial Diagnosis   Multiple myeloma without remission ([place])   04/11/2019 PET scan   IMPRESSION: 1. Previously noted expansile  lesions involving the L5 vertebra and sacrum are again noted and exhibit intense FDG uptake compatible  with metabolically active disease. A third focus of increased uptake without corresponding CT abnormality is noted within the intertrochanteric portions of the proximal right femur. 2. Small lucent lesion within the T3 vertebra is noted without corresponding FDG uptake. 3. Asymmetric left bladder wall thickening, etiology indeterminate. 4. Aortic Atherosclerosis (ICD10-I70.0). Lad coronary artery calcification.   04/12/2019 Cancer Staging   Staging form: Plasma Cell Myeloma and Plasma Cell Disorders, AJCC 8th Edition - Clinical stage from 04/12/2019: Beta-2-microglobulin (mg/L): 2, Albumin (g/dL): 3.6, ISS: Stage I, High-risk cytogenetics: Unknown, LDH: Unknown - Signed by Heath Lark, MD on 04/12/2019   04/14/2019 Bone Marrow Biopsy   Bone Marrow, Aspirate,Biopsy, and Clot, right iliac bone - PLASMA CELL MYELOMA.   04/17/2019 - 07/11/2019 Chemotherapy   The patient had bortezemib, revlimid and dexamethasone for chemotherapy treatment.     08/17/2019 Bone Marrow Transplant   He received high dose melphalan followed by autologous stem cell transplant at [facility]   11/22/2019 Bone Marrow Biopsy   BONE MARROW biopsy at [facility]:       Mild monoclonal plasmacytosis (approximately 2%), kappa light chain restricted.    11/22/2019 PET scan   PET CT at [facility] 1.  Similar size and appearance of mildly hypermetabolic lytic lesion in the L5 vertebral body and posterior elements.  2.  Otherwise, no substantial FDG uptake compared to background bone marrow in the additional osseous lucent lesions.    12/20/2019 - 05/09/2020 Radiation Therapy   Radiation Treatment Dates: 12/20/2019 through 01/08/2020 Site Technique Total Dose (Gy) Dose per Fx (Gy) Completed Fx Beam Energies  Lumbar Spine: Spine 3D 35/35 2.5 14/14 15X        02/29/2020 PET scan   1. Similar size and appearance of expansile lytic lesion in the L5 vertebral body and posterior elements with minimal FDG activity.  2. Otherwise, no  additional osseous lucent lesions with FDG uptake above background bone marrow     REVIEW OF SYSTEMS:   Constitutional: Denies fevers, chills or abnormal weight loss Eyes: Denies blurriness of vision Ears, nose, mouth, throat, and face: Denies mucositis or sore throat Respiratory: Denies cough, dyspnea or wheezes Cardiovascular: Denies palpitation, chest discomfort or lower extremity swelling Gastrointestinal:  Denies nausea, heartburn or change in bowel habits Skin: Denies abnormal skin rashes Lymphatics: Denies new lymphadenopathy or easy bruising Neurological:Denies numbness, tingling or new weaknesses Behavioral/Psych: Mood is stable, no new changes  All other systems were reviewed with the patient and are negative.  I have reviewed the past medical history, past surgical history, social history and family history with the patient and they are unchanged from previous note.  ALLERGIES:  has No Known Allergies.  MEDICATIONS:  Current Outpatient Medications  Medication Sig Dispense Refill  . acetaminophen (TYLENOL) 500 MG tablet Take 500 mg by mouth every 6 (six) hours as needed.    Marland Kitchen acyclovir (ZOVIRAX) 400 MG tablet Take 2 tablets (800 mg total) by mouth 2 (two) times daily. 60 tablet 11  . calcium carbonate (TUMS - DOSED IN MG ELEMENTAL CALCIUM) 500 MG chewable tablet Chew 1 tablet by mouth 2 (two) times daily.    . cholecalciferol (VITAMIN D3) 25 MCG (1000 UT) tablet Take 2,000 Units by mouth daily.    . folic acid (FOLVITE) 1 MG tablet Take 1 mg by mouth daily.    Marland Kitchen lenalidomide (REVLIMID) 20 MG capsule Take 1 capsule for 21  days, then stop for 7 days for a cycle of every 28 days 21 capsule 11  . lisinopril-hydrochlorothiazide (ZESTORETIC) 20-12.5 MG tablet Take 1 tablet by mouth daily. 90 tablet 3  . Multiple Vitamin (MULTI-VITAMIN) tablet Take by mouth.    . ondansetron (ZOFRAN) 8 MG tablet Take 1 tablet (8 mg total) by mouth 2 (two) times daily as needed (Nausea or vomiting).  30 tablet 1  . prochlorperazine (COMPAZINE) 10 MG tablet Take 1 tablet (10 mg total) by mouth every 6 (six) hours as needed (Nausea or vomiting). 30 tablet 1  . traMADol (ULTRAM) 50 MG tablet Take 1 tablet (50 mg total) by mouth every 6 (six) hours as needed. 60 tablet 0  . vitamin B-12 (CYANOCOBALAMIN) 1000 MCG tablet Take 1,000 mcg by mouth daily.     No current facility-administered medications for this visit.    PHYSICAL EXAMINATION: ECOG PERFORMANCE STATUS: 1 - Symptomatic but completely ambulatory  Vitals:   05/20/20 0940  BP: 132/79  Pulse: 74  Resp: 18  Temp: (!) 97.3 F (36.3 C)  SpO2: 98%   Filed Weights   05/20/20 0940  Weight: 227 lb 3.2 oz (103.1 kg)    GENERAL:alert, no distress and comfortable  NEURO: alert & oriented x 3 with fluent speech, no focal motor/sensory deficits  LABORATORY DATA:  I have reviewed the data as listed    Component Value Date/Time   NA 140 10/11/2019 0913   NA 139 10/11/2018 1115   NA 139 04/05/2014 0849   K 4.1 10/11/2019 0913   K 4.4 04/05/2014 0849   CL 104 10/11/2019 0913   CO2 27 10/11/2019 0913   CO2 23 04/05/2014 0849   GLUCOSE 92 10/11/2019 0913   GLUCOSE 109 04/05/2014 0849   BUN 17 10/11/2019 0913   BUN 23 10/11/2018 1115   BUN 16.8 04/05/2014 0849   CREATININE 0.78 10/11/2019 0913   CREATININE 0.91 07/29/2017 1004   CREATININE 0.9 04/05/2014 0849   CALCIUM 8.3 (L) 10/11/2019 0913   CALCIUM 8.7 04/05/2014 0849   PROT 6.4 (L) 10/11/2019 0913   PROT 7.2 10/11/2018 1115   PROT 6.7 04/05/2014 0849   ALBUMIN 3.9 10/11/2019 0913   ALBUMIN 4.7 10/11/2018 1115   ALBUMIN 3.8 04/05/2014 0849   AST 26 10/11/2019 0913   AST 26 04/05/2014 0849   ALT 44 10/11/2019 0913   ALT 30 04/05/2014 0849   ALKPHOS 53 10/11/2019 0913   ALKPHOS 75 04/05/2014 0849   BILITOT 1.1 10/11/2019 0913   BILITOT 0.71 04/05/2014 0849   GFRNONAA >60 10/11/2019 0913   GFRAA >60 10/11/2019 0913    No results found for: SPEP, UPEP  Lab  Results  Component Value Date   WBC 6.9 10/11/2019   NEUTROABS 3.2 10/11/2019   HGB 14.4 10/11/2019   HCT 42.0 10/11/2019   MCV 100.2 (H) 10/11/2019   PLT 208 10/11/2019      Chemistry      Component Value Date/Time   NA 140 10/11/2019 0913   NA 139 10/11/2018 1115   NA 139 04/05/2014 0849   K 4.1 10/11/2019 0913   K 4.4 04/05/2014 0849   CL 104 10/11/2019 0913   CO2 27 10/11/2019 0913   CO2 23 04/05/2014 0849   BUN 17 10/11/2019 0913   BUN 23 10/11/2018 1115   BUN 16.8 04/05/2014 0849   CREATININE 0.78 10/11/2019 0913   CREATININE 0.91 07/29/2017 1004   CREATININE 0.9 04/05/2014 0849   GLU 10 04/10/2014 0000  Component Value Date/Time   CALCIUM 8.3 (L) 10/11/2019 0913   CALCIUM 8.7 04/05/2014 0849   ALKPHOS 53 10/11/2019 0913   ALKPHOS 75 04/05/2014 0849   AST 26 10/11/2019 0913   AST 26 04/05/2014 0849   ALT 44 10/11/2019 0913   ALT 30 04/05/2014 0849   BILITOT 1.1 10/11/2019 0913   BILITOT 0.71 04/05/2014 0849

## 2020-05-23 ENCOUNTER — Telehealth: Payer: Self-pay

## 2020-05-23 ENCOUNTER — Telehealth: Payer: Self-pay | Admitting: Pharmacist

## 2020-05-23 ENCOUNTER — Other Ambulatory Visit: Payer: Self-pay | Admitting: Pharmacist

## 2020-05-23 DIAGNOSIS — C9000 Multiple myeloma not having achieved remission: Secondary | ICD-10-CM

## 2020-05-23 MED ORDER — LENALIDOMIDE 20 MG PO CAPS: ORAL_CAPSULE | ORAL | 11 refills | Status: DC

## 2020-05-23 NOTE — Telephone Encounter (Signed)
Oral Oncology Patient Advocate Encounter  Received notification from [place] that prior authorization for Revlimid is required.  PA submitted on CoverMyMeds Key [id number] Status is pending  Oral Oncology Clinic will continue to follow.  [place] Patient [place] Phone [phone] Fax [phone] 05/23/2020 8:12 AM

## 2020-05-23 NOTE — Telephone Encounter (Signed)
Oral Oncology Patient Advocate Encounter  Prior Authorization for Revlimid has been approved.    PA# [id number] Effective dates: 02/23/20 through 05/23/21  Oral Oncology Clinic will continue to follow.   Jesse Mcclure Phone [phone] Fax [phone] 05/23/2020 8:28 AM

## 2020-05-23 NOTE — Telephone Encounter (Signed)
Oral Oncology Pharmacist Encounter  Received new prescription for Revlimid (lenalidomide) for the maintenance treatment of multiple myeloma, planned duration until disease progression or unacceptable drug toxicity.  Prescription dose and frequency assessed for appropriateness. OK for therapy initiation.   CBC w/ Diff and CMP obtained at [facility] on 05/16/20 assessed, no relevant lab abnormalities noted.  Current medication list in Epic reviewed, no relevant/significant DDIs with Revlimid identified.  Evaluated chart and no patient barriers to medication adherence noted.   Prescription has been e-scribed to [facility] by [employer].  Oral Oncology Clinic will continue to follow for insurance authorization, copayment issues, initial counseling and start date.  Leron Croak, PharmD, BCPS Hematology/Oncology Clinical Pharmacist [facility] [phone] 05/23/2020 8:22 AM

## 2020-05-24 ENCOUNTER — Telehealth: Payer: Self-pay

## 2020-05-24 NOTE — Telephone Encounter (Signed)
Oral Oncology Patient Advocate Encounter  Was successful in securing patient a $11000 grant from [facility] to provide copayment coverage for Revlimid.  This will keep the out of pocket expense at $0.     [employer] ID: [id number]  I have spoken with the patient.   The billing information is as follows and has been shared with [facility].    RxBin: [id number] PCN: [id number] Member ID: [id number] Group ID: [id number] Dates of Eligibility: 04/24/20 through 04/23/21  Fund:  [place] Patient [place] Phone [phone] Fax [phone] 05/24/2020 10:11 AM

## 2020-05-24 NOTE — Telephone Encounter (Signed)
Oral Chemotherapy Pharmacist Encounter  I spoke with patient for overview of: Revlimid for the maintenance treatment of multiple myeloma, planned duration until disease progression or unacceptable toxicity.   Counseled patient on administration, dosing, side effects, monitoring, drug-food interactions, safe handling, storage, and disposal.  Patient will take Revlimid 10m capsules, 1 capsule by mouth once daily, without regard to food, with a full glass of water.  Revlimid will be given 21 days on, 7 days off, repeat every 28 days.  Revlimid start date: Patient will start Revlimid once received from [facility]  Adverse effects of Revlimid include but are not limited to: nausea, constipation, diarrhea, abdominal pain, rash, fatigue, drug fever, and decreased blood counts.    Reviewed with patient importance of keeping a medication schedule and plan for any missed doses. No barriers to medication adherence identified.  Medication reconciliation performed and medication/allergy list updated.  Patient confirmed he is still taking acyclovir once daily.  Patient counseled on importance of daily aspirin 3256mfor VTE prophylaxis. He stated he started taking aspirin 325 mg once daily this week.   Insurance authorization for Revlimid has been obtained. [place] obtained for $11000 for patient. Information sent to [facility].    Revlimid prescription is being dispensed from [facility] it is a limited distribution medication. Patient provided with [facility] phone number.   All questions answered.  Mr. RoGagliooiced understanding and appreciation.   Patient knows to call the office with questions or concerns.  ReLeron CroakPharmD, BCPS Hematology/Oncology Clinical Pharmacist [phone]/20/2021 2:04 PM

## 2020-05-30 ENCOUNTER — Telehealth: Payer: Self-pay | Admitting: *Deleted

## 2020-05-30 NOTE — Telephone Encounter (Signed)
Received vm call from pt stating that he thinks he is having a reaction from the revlimid.  He reports an itchy scalp.  Called pt & he states that he started 20 mg dose on Mon night & has taken 3 nights & scalp was very itchy.  He states he took 2 ibuprofen during night & this helped some & itching is not as bad now.. Message to Dr Alvy Bimler.

## 2020-05-31 ENCOUNTER — Telehealth: Payer: Self-pay

## 2020-05-31 NOTE — Telephone Encounter (Signed)
-----   Message from Heath Lark, MD sent at 05/31/2020  8:30 AM EDT ----- Regarding: skin itching It is unusual to only get on the scalp 1) take pictures 2) take benadryl OTC prn and 1% hydrocortisone prn (also OTC) 3) If the itching starts to get diffuse, stop until I see him

## 2020-05-31 NOTE — Telephone Encounter (Signed)
TC to patient regarding itchy scalp.  Dr. Calton Dach recommendations conveyed to patient.  Patient stated his scalp is "still itchy but not as bad".  Mr. Witherington stated he wet his head to be able to see his scalp and did not see any redness, sores or flakes.  Patient stated he is unable to take a picture of his scalp.  Mr. Spadaccini is agreeable to recommendations of benadryl and hydrocortisone cream and will pick up this morning.  Patient instructed to stop medication and call office for appointment if itching worsens.

## 2020-06-07 ENCOUNTER — Inpatient Hospital Stay: Payer: Medicare Other

## 2020-06-07 ENCOUNTER — Inpatient Hospital Stay: Payer: Medicare Other | Attending: Hematology and Oncology | Admitting: Hematology and Oncology

## 2020-06-07 ENCOUNTER — Encounter: Payer: Self-pay | Admitting: Hematology and Oncology

## 2020-06-07 ENCOUNTER — Other Ambulatory Visit: Payer: Self-pay

## 2020-06-07 VITALS — BP 139/82 | HR 82 | Resp 18

## 2020-06-07 DIAGNOSIS — E669 Obesity, unspecified: Secondary | ICD-10-CM | POA: Diagnosis not present

## 2020-06-07 DIAGNOSIS — Z79899 Other long term (current) drug therapy: Secondary | ICD-10-CM | POA: Diagnosis not present

## 2020-06-07 DIAGNOSIS — Z9221 Personal history of antineoplastic chemotherapy: Secondary | ICD-10-CM | POA: Insufficient documentation

## 2020-06-07 DIAGNOSIS — C9000 Multiple myeloma not having achieved remission: Secondary | ICD-10-CM | POA: Diagnosis not present

## 2020-06-07 DIAGNOSIS — Z7982 Long term (current) use of aspirin: Secondary | ICD-10-CM | POA: Insufficient documentation

## 2020-06-07 DIAGNOSIS — Z923 Personal history of irradiation: Secondary | ICD-10-CM | POA: Insufficient documentation

## 2020-06-07 LAB — CMP (CANCER CENTER ONLY)
ALT: 36 U/L (ref 0–44)
AST: 26 U/L (ref 15–41)
Albumin: 3.6 g/dL (ref 3.5–5.0)
Alkaline Phosphatase: 64 U/L (ref 38–126)
Anion gap: 8 (ref 5–15)
BUN: 20 mg/dL (ref 8–23)
CO2: 27 mmol/L (ref 22–32)
Calcium: 8.8 mg/dL — ABNORMAL LOW (ref 8.9–10.3)
Chloride: 103 mmol/L (ref 98–111)
Creatinine: 0.84 mg/dL (ref 0.61–1.24)
GFR, Est AFR Am: >60 mL/min (ref >60)
GFR, Estimated: >60 mL/min (ref >60)
Glucose, Bld: 114 mg/dL — ABNORMAL HIGH (ref 70–99)
Potassium: 3.9 mmol/L (ref 3.5–5.1)
Sodium: 138 mmol/L (ref 135–145)
Total Bilirubin: 1.1 mg/dL (ref 0.3–1.2)
Total Protein: 6.3 g/dL — ABNORMAL LOW (ref 6.5–8.1)

## 2020-06-07 LAB — CBC WITH DIFFERENTIAL (CANCER CENTER ONLY)
Abs Immature Granulocytes: 0.02 10*3/uL (ref 0.00–0.07)
Basophils Absolute: 0 10*3/uL (ref 0.0–0.1)
Basophils Relative: 1 %
Eosinophils Absolute: 0.2 10*3/uL (ref 0.0–0.5)
Eosinophils Relative: 4 %
HCT: 40.9 % (ref 39.0–52.0)
Hemoglobin: 14.3 g/dL (ref 13.0–17.0)
Immature Granulocytes: 0 %
Lymphocytes Relative: 28 %
Lymphs Abs: 1.4 10*3/uL (ref 0.7–4.0)
MCH: 35.4 pg — ABNORMAL HIGH (ref 26.0–34.0)
MCHC: 35 g/dL (ref 30.0–36.0)
MCV: 101.2 fL — ABNORMAL HIGH (ref 80.0–100.0)
Monocytes Absolute: 0.5 10*3/uL (ref 0.1–1.0)
Monocytes Relative: 11 %
Neutro Abs: 2.8 10*3/uL (ref 1.7–7.7)
Neutrophils Relative %: 56 %
Platelet Count: 176 10*3/uL (ref 150–400)
RBC: 4.04 MIL/uL — ABNORMAL LOW (ref 4.22–5.81)
RDW: 12.2 % (ref 11.5–15.5)
WBC Count: 4.9 10*3/uL (ref 4.0–10.5)
nRBC: 0 % (ref 0.0–0.2)

## 2020-06-07 MED ORDER — DENOSUMAB 120 MG/1.7ML ~~LOC~~ SOLN
120.0000 mg | Freq: Once | SUBCUTANEOUS | Status: AC
  Administered 2020-06-07: 120 mg via SUBCUTANEOUS

## 2020-06-07 MED ORDER — ACYCLOVIR 400 MG PO TABS: 400.0000 mg | ORAL_TABLET | Freq: Two times a day (BID) | ORAL | 11 refills | Status: DC

## 2020-06-07 MED ORDER — DENOSUMAB 120 MG/1.7ML ~~LOC~~ SOLN
SUBCUTANEOUS | Status: AC
  Filled 2020-06-07: qty 1.7

## 2020-06-07 NOTE — Patient Instructions (Signed)
Denosumab injection What is this medicine? DENOSUMAB (den oh sue mab) slows bone breakdown. Prolia is used to treat osteoporosis in women after menopause and in men, and in people who are taking corticosteroids for 6 months or more. Xgeva is used to treat a high calcium level due to cancer and to prevent bone fractures and other bone problems caused by multiple myeloma or cancer bone metastases. Xgeva is also used to treat giant cell tumor of the bone. This medicine may be used for other purposes; ask your health care provider or pharmacist if you have questions. COMMON BRAND NAME(S): Prolia, XGEVA What should I tell my health care provider before I take this medicine? They need to know if you have any of these conditions:  dental disease  having surgery or tooth extraction  infection  kidney disease  low levels of calcium or Vitamin D in the blood  malnutrition  on hemodialysis  skin conditions or sensitivity  thyroid or parathyroid disease  an unusual reaction to denosumab, other medicines, foods, dyes, or preservatives  pregnant or trying to get pregnant  breast-feeding How should I use this medicine? This medicine is for injection under the skin. It is given by a health care professional in a hospital or clinic setting. A special MedGuide will be given to you before each treatment. Be sure to read this information carefully each time. For Prolia, talk to your pediatrician regarding the use of this medicine in children. Special care may be needed. For Xgeva, talk to your pediatrician regarding the use of this medicine in children. While this drug may be prescribed for children as young as 13 years for selected conditions, precautions do apply. Overdosage: If you think you have taken too much of this medicine contact a poison control center or emergency room at once. NOTE: This medicine is only for you. Do not share this medicine with others. What if I miss a dose? It is  important not to miss your dose. Call your doctor or health care professional if you are unable to keep an appointment. What may interact with this medicine? Do not take this medicine with any of the following medications:  other medicines containing denosumab This medicine may also interact with the following medications:  medicines that lower your chance of fighting infection  steroid medicines like prednisone or cortisone This list may not describe all possible interactions. Give your health care provider a list of all the medicines, herbs, non-prescription drugs, or dietary supplements you use. Also tell them if you smoke, drink alcohol, or use illegal drugs. Some items may interact with your medicine. What should I watch for while using this medicine? Visit your doctor or health care professional for regular checks on your progress. Your doctor or health care professional may order blood tests and other tests to see how you are doing. Call your doctor or health care professional for advice if you get a fever, chills or sore throat, or other symptoms of a cold or flu. Do not treat yourself. This drug may decrease your body's ability to fight infection. Try to avoid being around people who are sick. You should make sure you get enough calcium and vitamin D while you are taking this medicine, unless your doctor tells you not to. Discuss the foods you eat and the vitamins you take with your health care professional. See your dentist regularly. Brush and floss your teeth as directed. Before you have any dental work done, tell your dentist you are   receiving this medicine. Do not become pregnant while taking this medicine or for 5 months after stopping it. Talk with your doctor or health care professional about your birth control options while taking this medicine. Women should inform their doctor if they wish to become pregnant or think they might be pregnant. There is a potential for serious side  effects to an unborn child. Talk to your health care professional or pharmacist for more information. What side effects may I notice from receiving this medicine? Side effects that you should report to your doctor or health care professional as soon as possible:  allergic reactions like skin rash, itching or hives, swelling of the face, lips, or tongue  bone pain  breathing problems  dizziness  jaw pain, especially after dental work  redness, blistering, peeling of the skin  signs and symptoms of infection like fever or chills; cough; sore throat; pain or trouble passing urine  signs of low calcium like fast heartbeat, muscle cramps or muscle pain; pain, tingling, numbness in the hands or feet; seizures  unusual bleeding or bruising  unusually weak or tired Side effects that usually do not require medical attention (report to your doctor or health care professional if they continue or are bothersome):  constipation  diarrhea  headache  joint pain  loss of appetite  muscle pain  runny nose  tiredness  upset stomach This list may not describe all possible side effects. Call your doctor for medical advice about side effects. You may report side effects to FDA at 1-800-FDA-1088. Where should I keep my medicine? This medicine is only given in a clinic, doctor's office, or other health care setting and will not be stored at home. NOTE: This sheet is a summary. It may not cover all possible information. If you have questions about this medicine, talk to your doctor, pharmacist, or health care provider.  2020 Elsevier/Gold Standard (2018-01-28 16:10:44)

## 2020-06-07 NOTE — Progress Notes (Signed)
Per MD ok to give injection with current calcium. Make sure patient is taking calcium supplements.

## 2020-06-07 NOTE — Progress Notes (Signed)
[place] OFFICE PROGRESS NOTE  Patient Care Team: Denita Lung, MD as PCP - General (Family Medicine) Heath Lark, MD as Consulting Physician (Hematology and Oncology)  ASSESSMENT & PLAN:  Multiple myeloma without remission ([place]) He tolerated treatment very well His blood counts are satisfactory He will come back next week for repeat blood work again I will order myeloma panel once a month I recommend reducing acyclovir to preventative dose of 400 mg twice a day He will continue calcium with vitamin D and Xgeva every 3 months I will see him back next month for further follow-up  Obesity (BMI 30-39.9) We discussed the importance of weight loss and lifestyle modification   Orders Placed This Encounter  Procedures   Kappa/lambda light chains    Standing Status:   Standing    Number of Occurrences:   22    Standing Expiration Date:   06/07/2021   Multiple Myeloma Panel (SPEP&IFE w/QIG)    Standing Status:   Standing    Number of Occurrences:   22    Standing Expiration Date:   06/07/2021    All questions were answered. The patient knows to call the clinic with any problems, questions or concerns. The total time spent in the appointment was 20 minutes encounter with patients including review of chart and various tests results, discussions about plan of care and coordination of care plan   Heath Lark, MD 06/07/2020 12:08 PM  INTERVAL HISTORY: Please see below for problem oriented charting. He returns for further follow-up He tolerated Revlimid well He had brief episode of rash on his skull that responded well to over-the-counter hydrocortisone No other skin rashes Denies diarrhea or constipation He has gained some weight No new bone pain No recent infection, fever or chills  SUMMARY OF ONCOLOGIC HISTORY: Oncology History  Multiple myeloma without remission ([place])  03/20/2019 Imaging   1. Tumor involving the L5 and S1 and S2 segments of the spine as  described with mass effects upon the left L4, L5, S1 and more distal left sacral nerves as described above. 2. Does the patient have a history of malignancy? 3. Multiple plasmacytomas and metastatic disease could give this appearance   03/27/2019 Pathology Results   Soft Tissue Needle Core Biopsy, sacral mass - PLASMABLASTIC NEOPLASM. - SEE COMMENT. Microscopic Comment The sections show needle core biopsy fragments of soft tissue densely infiltrated by a relatively monomorphic infiltrate of atypical plasmacytoid cells characterized by vesicular or partially clumped chromatin and prominent nucleoli. This is associated with scattered mitosis. A battery of immunohistochemical stains was performed and show that the atypical plasmacytoid cells are positive for CD138, CD43 and cytoplasmic kappa. There is also weak positivity for CD56 and partial variable positivity for cyclin D1. The atypical plasmacytoid cells are negative for cytoplasmic lambda, CD10, PAX5, CD79a, CD20, CD3, CD5, CD34, EBV (ISH) and mostly negative for LCA. The overall morphologic and histologic features are most compatible with a plasmablastic neoplasm. The differential diagnosis includes plasmablastic lymphoma and plasmablastic plasmacytoma/myeloma. Based on the overall phenotypic features and the clinical setting, plasmacytoma/myeloma is favored. Clinical correlation and hematologic evaluation is recommended   03/27/2019 Procedure   Technically successful CT-guided biopsy of sacral soft tissue mass.   04/04/2019 Initial Diagnosis   Multiple myeloma without remission ([place])   04/11/2019 PET scan   IMPRESSION: 1. Previously noted expansile lesions involving the L5 vertebra and sacrum are again noted and exhibit intense FDG uptake compatible with metabolically active disease. A third focus of  increased uptake without corresponding CT abnormality is noted within the intertrochanteric portions of the proximal right femur. 2. Small lucent  lesion within the T3 vertebra is noted without corresponding FDG uptake. 3. Asymmetric left bladder wall thickening, etiology indeterminate. 4. Aortic Atherosclerosis (ICD10-I70.0). Lad coronary artery calcification.   04/12/2019 Cancer Staging   Staging form: Plasma Cell Myeloma and Plasma Cell Disorders, AJCC 8th Edition - Clinical stage from 04/12/2019: Beta-2-microglobulin (mg/L): 2, Albumin (g/dL): 3.6, ISS: Stage I, High-risk cytogenetics: Unknown, LDH: Unknown - Signed by Heath Lark, MD on 04/12/2019   04/14/2019 Bone Marrow Biopsy   Bone Marrow, Aspirate,Biopsy, and Clot, right iliac bone - PLASMA CELL MYELOMA.   04/17/2019 - 07/11/2019 Chemotherapy   The patient had bortezemib, revlimid and dexamethasone for chemotherapy treatment.     08/17/2019 Bone Marrow Transplant   He received high dose melphalan followed by autologous stem cell transplant at [facility] [place]   11/22/2019 Bone Marrow Biopsy   BONE MARROW biopsy at [facility]:       Mild monoclonal plasmacytosis (approximately 2%), kappa light chain restricted.    11/22/2019 PET scan   PET CT at [facility] 1.  Similar size and appearance of mildly hypermetabolic lytic lesion in the L5 vertebral body and posterior elements.  2.  Otherwise, no substantial FDG uptake compared to background bone marrow in the additional osseous lucent lesions.    12/20/2019 - 05/09/2020 Radiation Therapy   Radiation Treatment Dates: 12/20/2019 through 01/08/2020 Site Technique Total Dose (Gy) Dose per Fx (Gy) Completed Fx Beam Energies  Lumbar Spine: Spine 3D 35/35 2.5 14/14 15X        02/29/2020 PET scan   1. Similar size and appearance of expansile lytic lesion in the L5 vertebral body and posterior elements with minimal FDG activity.  2. Otherwise, no additional osseous lucent lesions with FDG uptake above background bone marrow   05/31/2020 -  Chemotherapy   The patient had Revlimid for chemotherapy treatment.       REVIEW OF SYSTEMS:    Constitutional: Denies fevers, chills or abnormal weight loss Eyes: Denies blurriness of vision Ears, nose, mouth, throat, and face: Denies mucositis or sore throat Respiratory: Denies cough, dyspnea or wheezes Cardiovascular: Denies palpitation, chest discomfort or lower extremity swelling Gastrointestinal:  Denies nausea, heartburn or change in bowel habits Skin: Denies abnormal skin rashes Lymphatics: Denies new lymphadenopathy or easy bruising Neurological:Denies numbness, tingling or new weaknesses Behavioral/Psych: Mood is stable, no new changes  All other systems were reviewed with the patient and are negative.  I have reviewed the past medical history, past surgical history, social history and family history with the patient and they are unchanged from previous note.  ALLERGIES:  has No Known Allergies.  MEDICATIONS:  Current Outpatient Medications  Medication Sig Dispense Refill   acetaminophen (TYLENOL) 500 MG tablet Take 500 mg by mouth every 6 (six) hours as needed.     acyclovir (ZOVIRAX) 400 MG tablet Take 1 tablet (400 mg total) by mouth 2 (two) times daily. 60 tablet 11   aspirin 325 MG tablet Take 325 mg by mouth daily.     calcium carbonate (TUMS - DOSED IN MG ELEMENTAL CALCIUM) 500 MG chewable tablet Chew 1 tablet by mouth 2 (two) times daily.     cholecalciferol (VITAMIN D3) 25 MCG (1000 UT) tablet Take 2,000 Units by mouth daily.     folic acid (FOLVITE) 1 MG tablet Take 1 mg by mouth daily.     lenalidomide (REVLIMID) 20  MG capsule Take 1 capsule for 21 days, then stop for 7 days for a cycle of every 28 days 21 capsule 11   lisinopril-hydrochlorothiazide (ZESTORETIC) 20-12.5 MG tablet Take 1 tablet by mouth daily. 90 tablet 3   Multiple Vitamin (MULTI-VITAMIN) tablet Take by mouth.     ondansetron (ZOFRAN) 8 MG tablet Take 1 tablet (8 mg total) by mouth 2 (two) times daily as needed (Nausea or vomiting). 30 tablet 1   prochlorperazine (COMPAZINE) 10 MG  tablet Take 1 tablet (10 mg total) by mouth every 6 (six) hours as needed (Nausea or vomiting). 30 tablet 1   traMADol (ULTRAM) 50 MG tablet Take 1 tablet (50 mg total) by mouth every 6 (six) hours as needed. 60 tablet 0   vitamin B-12 (CYANOCOBALAMIN) 1000 MCG tablet Take 1,000 mcg by mouth daily.     No current facility-administered medications for this visit.    PHYSICAL EXAMINATION: ECOG PERFORMANCE STATUS: 1 - Symptomatic but completely ambulatory  Vitals:   06/07/20 0910  BP: 124/84  Pulse: 92  Resp: 18  Temp: 98.2 F (36.8 C)  SpO2: 99%   Filed Weights   06/07/20 0910  Weight: 226 lb 6.4 oz (102.7 kg)    GENERAL:alert, no distress and comfortable SKIN: skin color, texture, turgor are normal, no rashes or significant lesions EYES: normal, Conjunctiva are pink and non-injected, sclera clear OROPHARYNX:no exudate, no erythema and lips, buccal mucosa, and tongue normal  NECK: supple, thyroid normal size, non-tender, without nodularity LYMPH:  no palpable lymphadenopathy in the cervical, axillary or inguinal LUNGS: clear to auscultation and percussion with normal breathing effort HEART: regular rate & rhythm and no murmurs and no lower extremity edema ABDOMEN:abdomen soft, non-tender and normal bowel sounds Musculoskeletal:no cyanosis of digits and no clubbing  NEURO: alert & oriented x 3 with fluent speech, no focal motor/sensory deficits  LABORATORY DATA:  I have reviewed the data as listed    Component Value Date/Time   NA 138 06/07/2020 0839   NA 139 10/11/2018 1115   NA 139 04/05/2014 0849   K 3.9 06/07/2020 0839   K 4.4 04/05/2014 0849   CL 103 06/07/2020 0839   CO2 27 06/07/2020 0839   CO2 23 04/05/2014 0849   GLUCOSE 114 (H) 06/07/2020 0839   GLUCOSE 109 04/05/2014 0849   BUN 20 06/07/2020 0839   BUN 23 10/11/2018 1115   BUN 16.8 04/05/2014 0849   CREATININE 0.84 06/07/2020 0839   CREATININE 0.91 07/29/2017 1004   CREATININE 0.9 04/05/2014 0849    CALCIUM 8.8 (L) 06/07/2020 0839   CALCIUM 8.7 04/05/2014 0849   PROT 6.3 (L) 06/07/2020 0839   PROT 7.2 10/11/2018 1115   PROT 6.7 04/05/2014 0849   ALBUMIN 3.6 06/07/2020 0839   ALBUMIN 4.7 10/11/2018 1115   ALBUMIN 3.8 04/05/2014 0849   AST 26 06/07/2020 0839   AST 26 04/05/2014 0849   ALT 36 06/07/2020 0839   ALT 30 04/05/2014 0849   ALKPHOS 64 06/07/2020 0839   ALKPHOS 75 04/05/2014 0849   BILITOT 1.1 06/07/2020 0839   BILITOT 0.71 04/05/2014 0849   GFRNONAA >60 06/07/2020 0839   GFRAA >60 06/07/2020 0839    No results found for: SPEP, UPEP  Lab Results  Component Value Date   WBC 4.9 06/07/2020   NEUTROABS 2.8 06/07/2020   HGB 14.3 06/07/2020   HCT 40.9 06/07/2020   MCV 101.2 (H) 06/07/2020   PLT 176 06/07/2020      Chemistry  Component Value Date/Time   NA 138 06/07/2020 0839   NA 139 10/11/2018 1115   NA 139 04/05/2014 0849   K 3.9 06/07/2020 0839   K 4.4 04/05/2014 0849   CL 103 06/07/2020 0839   CO2 27 06/07/2020 0839   CO2 23 04/05/2014 0849   BUN 20 06/07/2020 0839   BUN 23 10/11/2018 1115   BUN 16.8 04/05/2014 0849   CREATININE 0.84 06/07/2020 0839   CREATININE 0.91 07/29/2017 1004   CREATININE 0.9 04/05/2014 0849   GLU 10 04/10/2014 0000      Component Value Date/Time   CALCIUM 8.8 (L) 06/07/2020 0839   CALCIUM 8.7 04/05/2014 0849   ALKPHOS 64 06/07/2020 0839   ALKPHOS 75 04/05/2014 0849   AST 26 06/07/2020 0839   AST 26 04/05/2014 0849   ALT 36 06/07/2020 0839   ALT 30 04/05/2014 0849   BILITOT 1.1 06/07/2020 0839   BILITOT 0.71 04/05/2014 0849

## 2020-06-07 NOTE — Assessment & Plan Note (Signed)
He tolerated treatment very well His blood counts are satisfactory He will come back next week for repeat blood work again I will order myeloma panel once a month I recommend reducing acyclovir to preventative dose of 400 mg twice a day He will continue calcium with vitamin D and Xgeva every 3 months I will see him back next month for further follow-up

## 2020-06-07 NOTE — Assessment & Plan Note (Signed)
We discussed the importance of weight loss and lifestyle modification 

## 2020-06-14 ENCOUNTER — Telehealth: Payer: Self-pay | Admitting: *Deleted

## 2020-06-14 ENCOUNTER — Other Ambulatory Visit: Payer: Self-pay

## 2020-06-14 ENCOUNTER — Inpatient Hospital Stay: Payer: Medicare Other

## 2020-06-14 DIAGNOSIS — C9000 Multiple myeloma not having achieved remission: Secondary | ICD-10-CM | POA: Diagnosis not present

## 2020-06-14 DIAGNOSIS — E669 Obesity, unspecified: Secondary | ICD-10-CM | POA: Diagnosis not present

## 2020-06-14 DIAGNOSIS — Z923 Personal history of irradiation: Secondary | ICD-10-CM | POA: Diagnosis not present

## 2020-06-14 DIAGNOSIS — Z7982 Long term (current) use of aspirin: Secondary | ICD-10-CM | POA: Diagnosis not present

## 2020-06-14 DIAGNOSIS — Z79899 Other long term (current) drug therapy: Secondary | ICD-10-CM | POA: Diagnosis not present

## 2020-06-14 DIAGNOSIS — Z9221 Personal history of antineoplastic chemotherapy: Secondary | ICD-10-CM | POA: Diagnosis not present

## 2020-06-14 LAB — CMP (CANCER CENTER ONLY)
ALT: 37 U/L (ref 0–44)
AST: 28 U/L (ref 15–41)
Albumin: 3.5 g/dL (ref 3.5–5.0)
Alkaline Phosphatase: 57 U/L (ref 38–126)
Anion gap: 11 (ref 5–15)
BUN: 18 mg/dL (ref 8–23)
CO2: 23 mmol/L (ref 22–32)
Calcium: 8.4 mg/dL — ABNORMAL LOW (ref 8.9–10.3)
Chloride: 102 mmol/L (ref 98–111)
Creatinine: 0.88 mg/dL (ref 0.61–1.24)
GFR, Est AFR Am: >60 mL/min (ref >60)
GFR, Estimated: >60 mL/min (ref >60)
Glucose, Bld: 148 mg/dL — ABNORMAL HIGH (ref 70–99)
Potassium: 4.1 mmol/L (ref 3.5–5.1)
Sodium: 136 mmol/L (ref 135–145)
Total Bilirubin: 1.1 mg/dL (ref 0.3–1.2)
Total Protein: 6.3 g/dL — ABNORMAL LOW (ref 6.5–8.1)

## 2020-06-14 LAB — CBC WITH DIFFERENTIAL (CANCER CENTER ONLY)
Abs Immature Granulocytes: 0.02 10*3/uL (ref 0.00–0.07)
Basophils Absolute: 0.1 10*3/uL (ref 0.0–0.1)
Basophils Relative: 1 %
Eosinophils Absolute: 0.2 10*3/uL (ref 0.0–0.5)
Eosinophils Relative: 5 %
HCT: 41.4 % (ref 39.0–52.0)
Hemoglobin: 14 g/dL (ref 13.0–17.0)
Immature Granulocytes: 0 %
Lymphocytes Relative: 30 %
Lymphs Abs: 1.3 10*3/uL (ref 0.7–4.0)
MCH: 34.7 pg — ABNORMAL HIGH (ref 26.0–34.0)
MCHC: 33.8 g/dL (ref 30.0–36.0)
MCV: 102.7 fL — ABNORMAL HIGH (ref 80.0–100.0)
Monocytes Absolute: 0.6 10*3/uL (ref 0.1–1.0)
Monocytes Relative: 13 %
Neutro Abs: 2.3 10*3/uL (ref 1.7–7.7)
Neutrophils Relative %: 51 %
Platelet Count: 172 10*3/uL (ref 150–400)
RBC: 4.03 MIL/uL — ABNORMAL LOW (ref 4.22–5.81)
RDW: 12.6 % (ref 11.5–15.5)
WBC Count: 4.5 10*3/uL (ref 4.0–10.5)
nRBC: 0 % (ref 0.0–0.2)

## 2020-06-14 NOTE — Telephone Encounter (Signed)
-----   Message from Heath Lark, MD sent at 06/14/2020  9:21 AM EDT ----- Regarding: labs today Pls let him know labs are good today I will see him next month as scehduled

## 2020-06-14 NOTE — Telephone Encounter (Signed)
Per Dr.Gorsuch, called pt with message below. Pt verbalized understanding 

## 2020-06-17 ENCOUNTER — Other Ambulatory Visit: Payer: Self-pay

## 2020-06-17 DIAGNOSIS — C9000 Multiple myeloma not having achieved remission: Secondary | ICD-10-CM

## 2020-06-17 LAB — MULTIPLE MYELOMA PANEL, SERUM
Albumin SerPl Elph-Mcnc: 3.5 g/dL (ref 2.9–4.4)
Albumin/Glob SerPl: 1.5 (ref 0.7–1.7)
Alpha 1: 0.2 g/dL (ref 0.0–0.4)
Alpha2 Glob SerPl Elph-Mcnc: 0.7 g/dL (ref 0.4–1.0)
B-Globulin SerPl Elph-Mcnc: 0.8 g/dL (ref 0.7–1.3)
Gamma Glob SerPl Elph-Mcnc: 0.7 g/dL (ref 0.4–1.8)
Globulin, Total: 2.4 g/dL (ref 2.2–3.9)
IgA: 40 mg/dL — ABNORMAL LOW (ref 61–437)
IgG (Immunoglobin G), Serum: 780 mg/dL (ref 603–1613)
IgM (Immunoglobulin M), Srm: 25 mg/dL (ref 20–172)
M Protein SerPl Elph-Mcnc: 0.3 g/dL — ABNORMAL HIGH
Total Protein ELP: 5.9 g/dL — ABNORMAL LOW (ref 6.0–8.5)

## 2020-06-17 LAB — KAPPA/LAMBDA LIGHT CHAINS
Kappa free light chain: 31.3 mg/L — ABNORMAL HIGH (ref 3.3–19.4)
Kappa, lambda light chain ratio: 1.78 — ABNORMAL HIGH (ref 0.26–1.65)
Lambda free light chains: 17.6 mg/L (ref 5.7–26.3)

## 2020-06-17 MED ORDER — LENALIDOMIDE 20 MG PO CAPS: ORAL_CAPSULE | ORAL | 11 refills | Status: DC

## 2020-06-19 DIAGNOSIS — Z23 Encounter for immunization: Secondary | ICD-10-CM | POA: Diagnosis not present

## 2020-07-16 ENCOUNTER — Other Ambulatory Visit: Payer: Self-pay

## 2020-07-16 DIAGNOSIS — C9000 Multiple myeloma not having achieved remission: Secondary | ICD-10-CM

## 2020-07-16 MED ORDER — LENALIDOMIDE 20 MG PO CAPS: ORAL_CAPSULE | ORAL | 11 refills | Status: DC

## 2020-07-22 ENCOUNTER — Encounter: Payer: Self-pay | Admitting: Hematology and Oncology

## 2020-07-22 ENCOUNTER — Other Ambulatory Visit: Payer: Self-pay

## 2020-07-22 ENCOUNTER — Inpatient Hospital Stay: Payer: Medicare Other

## 2020-07-22 ENCOUNTER — Inpatient Hospital Stay: Payer: Medicare Other | Attending: Hematology and Oncology | Admitting: Hematology and Oncology

## 2020-07-22 ENCOUNTER — Telehealth: Payer: Self-pay | Admitting: Hematology and Oncology

## 2020-07-22 DIAGNOSIS — I700 Atherosclerosis of aorta: Secondary | ICD-10-CM | POA: Insufficient documentation

## 2020-07-22 DIAGNOSIS — D701 Agranulocytosis secondary to cancer chemotherapy: Secondary | ICD-10-CM | POA: Insufficient documentation

## 2020-07-22 DIAGNOSIS — I2510 Atherosclerotic heart disease of native coronary artery without angina pectoris: Secondary | ICD-10-CM | POA: Insufficient documentation

## 2020-07-22 DIAGNOSIS — C9000 Multiple myeloma not having achieved remission: Secondary | ICD-10-CM | POA: Insufficient documentation

## 2020-07-22 DIAGNOSIS — Z9484 Stem cells transplant status: Secondary | ICD-10-CM | POA: Diagnosis not present

## 2020-07-22 DIAGNOSIS — T451X5A Adverse effect of antineoplastic and immunosuppressive drugs, initial encounter: Secondary | ICD-10-CM | POA: Diagnosis not present

## 2020-07-22 DIAGNOSIS — E669 Obesity, unspecified: Secondary | ICD-10-CM | POA: Diagnosis not present

## 2020-07-22 DIAGNOSIS — Z6830 Body mass index (BMI) 30.0-30.9, adult: Secondary | ICD-10-CM | POA: Insufficient documentation

## 2020-07-22 DIAGNOSIS — Z9481 Bone marrow transplant status: Secondary | ICD-10-CM | POA: Insufficient documentation

## 2020-07-22 DIAGNOSIS — Z79899 Other long term (current) drug therapy: Secondary | ICD-10-CM | POA: Insufficient documentation

## 2020-07-22 DIAGNOSIS — H35371 Puckering of macula, right eye: Secondary | ICD-10-CM | POA: Diagnosis not present

## 2020-07-22 DIAGNOSIS — H04123 Dry eye syndrome of bilateral lacrimal glands: Secondary | ICD-10-CM | POA: Diagnosis not present

## 2020-07-22 DIAGNOSIS — H5213 Myopia, bilateral: Secondary | ICD-10-CM | POA: Diagnosis not present

## 2020-07-22 DIAGNOSIS — H43813 Vitreous degeneration, bilateral: Secondary | ICD-10-CM | POA: Diagnosis not present

## 2020-07-22 LAB — CBC WITH DIFFERENTIAL (CANCER CENTER ONLY)
Abs Immature Granulocytes: 0.01 10*3/uL (ref 0.00–0.07)
Basophils Absolute: 0.1 10*3/uL (ref 0.0–0.1)
Basophils Relative: 2 %
Eosinophils Absolute: 0.1 10*3/uL (ref 0.0–0.5)
Eosinophils Relative: 3 %
HCT: 40.3 % (ref 39.0–52.0)
Hemoglobin: 13.8 g/dL (ref 13.0–17.0)
Immature Granulocytes: 0 %
Lymphocytes Relative: 43 %
Lymphs Abs: 1.5 10*3/uL (ref 0.7–4.0)
MCH: 34.8 pg — ABNORMAL HIGH (ref 26.0–34.0)
MCHC: 34.2 g/dL (ref 30.0–36.0)
MCV: 101.8 fL — ABNORMAL HIGH (ref 80.0–100.0)
Monocytes Absolute: 0.5 10*3/uL (ref 0.1–1.0)
Monocytes Relative: 16 %
Neutro Abs: 1.2 10*3/uL — ABNORMAL LOW (ref 1.7–7.7)
Neutrophils Relative %: 36 %
Platelet Count: 173 10*3/uL (ref 150–400)
RBC: 3.96 MIL/uL — ABNORMAL LOW (ref 4.22–5.81)
RDW: 13.2 % (ref 11.5–15.5)
WBC Count: 3.4 10*3/uL — ABNORMAL LOW (ref 4.0–10.5)
nRBC: 0 % (ref 0.0–0.2)

## 2020-07-22 LAB — CMP (CANCER CENTER ONLY)
ALT: 41 U/L (ref 0–44)
AST: 33 U/L (ref 15–41)
Albumin: 3.4 g/dL — ABNORMAL LOW (ref 3.5–5.0)
Alkaline Phosphatase: 51 U/L (ref 38–126)
Anion gap: 6 (ref 5–15)
BUN: 16 mg/dL (ref 8–23)
CO2: 27 mmol/L (ref 22–32)
Calcium: 8.1 mg/dL — ABNORMAL LOW (ref 8.9–10.3)
Chloride: 106 mmol/L (ref 98–111)
Creatinine: 0.76 mg/dL (ref 0.61–1.24)
GFR, Estimated: >60 mL/min (ref >60)
Glucose, Bld: 104 mg/dL — ABNORMAL HIGH (ref 70–99)
Potassium: 3.9 mmol/L (ref 3.5–5.1)
Sodium: 139 mmol/L (ref 135–145)
Total Bilirubin: 0.8 mg/dL (ref 0.3–1.2)
Total Protein: 6 g/dL — ABNORMAL LOW (ref 6.5–8.1)

## 2020-07-22 NOTE — Assessment & Plan Note (Signed)
He tolerated treatment very well His blood counts are stable and satisfactory I will call him at the end of the week once we have test results back I recommend him to continue on acyclovir for antimicrobial prophylaxis He will continue calcium with vitamin D and Xgeva every 3 months, due in December He has appointment to return to [place] next month for repeat bone marrow biopsy and bone marrow transplant evaluation/follow-up I will see him back in December 

## 2020-07-22 NOTE — Assessment & Plan Note (Signed)
We discussed the importance of weight loss and lifestyle modification

## 2020-07-22 NOTE — Assessment & Plan Note (Signed)
He has appointment to return to [facility] for evaluation next month He is up-to-date with all his vaccination

## 2020-07-22 NOTE — Assessment & Plan Note (Signed)
This is likely due to recent treatment. The patient denies recent history of fevers, cough, chills, diarrhea or dysuria. He is asymptomatic from the leukopenia. I will observe for now.  I will continue the chemotherapy at current dose without dosage adjustment.  If the leukopenia gets progressive worse in the future, I might have to delay his treatment or adjust the chemotherapy dose.  We discussed the importance of social distancing and hand hygiene

## 2020-07-22 NOTE — Progress Notes (Signed)
[place] OFFICE PROGRESS NOTE  Patient Care Team: Denita Lung, MD as PCP - General (Family Medicine) Heath Lark, MD as Consulting Physician (Hematology and Oncology)  ASSESSMENT & PLAN:  Multiple myeloma without remission ([place]) He tolerated treatment very well His blood counts are stable and satisfactory I will call him at the end of the week once we have test results back I recommend him to continue on acyclovir for antimicrobial prophylaxis He will continue calcium with vitamin D and Xgeva every 3 months, due in December He has appointment to return to [facility] next month for repeat bone marrow biopsy and bone marrow transplant evaluation/follow-up I will see him back in December  S/P bone marrow transplant [facility]) He has appointment to return to [facility] for evaluation next month He is up-to-date with all his vaccination  Leukopenia due to antineoplastic chemotherapy [facility]) This is likely due to recent treatment. The patient denies recent history of fevers, cough, chills, diarrhea or dysuria. He is asymptomatic from the leukopenia. I will observe for now.  I will continue the chemotherapy at current dose without dosage adjustment.  If the leukopenia gets progressive worse in the future, I might have to delay his treatment or adjust the chemotherapy dose.  We discussed the importance of social distancing and hand hygiene  Obesity (BMI 30-39.9) We discussed the importance of weight loss and lifestyle modification   No orders of the defined types were placed in this encounter.   All questions were answered. The patient knows to call the clinic with any problems, questions or concerns. The total time spent in the appointment was 20 minutes encounter with patients including review of chart and various tests results, discussions about plan of care and coordination of care plan   Heath Lark, MD 07/22/2020 8:43 AM  INTERVAL HISTORY: Please see below for  problem oriented charting. He returns for further follow-up He is doing well No recent infection, fever or chills He is noticed to have gained some weight due to poor dietary choices but appears motivated to change Denies bone pain He has multiple appointments pending at [facility] next month No recent dental issues  SUMMARY OF ONCOLOGIC HISTORY: Oncology History  Multiple myeloma without remission ([place])  03/20/2019 Imaging   1. Tumor involving the L5 and S1 and S2 segments of the spine as described with mass effects upon the left L4, L5, S1 and more distal left sacral nerves as described above. 2. Does the patient have a history of malignancy? 3. Multiple plasmacytomas and metastatic disease could give this appearance   03/27/2019 Pathology Results   Soft Tissue Needle Core Biopsy, sacral mass - PLASMABLASTIC NEOPLASM. - SEE COMMENT. Microscopic Comment The sections show needle core biopsy fragments of soft tissue densely infiltrated by a relatively monomorphic infiltrate of atypical plasmacytoid cells characterized by vesicular or partially clumped chromatin and prominent nucleoli. This is associated with scattered mitosis. A battery of immunohistochemical stains was performed and show that the atypical plasmacytoid cells are positive for CD138, CD43 and cytoplasmic kappa. There is also weak positivity for CD56 and partial variable positivity for cyclin D1. The atypical plasmacytoid cells are negative for cytoplasmic lambda, CD10, PAX5, CD79a, CD20, CD3, CD5, CD34, EBV (ISH) and mostly negative for LCA. The overall morphologic and histologic features are most compatible with a plasmablastic neoplasm. The differential diagnosis includes plasmablastic lymphoma and plasmablastic plasmacytoma/myeloma. Based on the overall phenotypic features and the clinical setting, plasmacytoma/myeloma is favored. Clinical correlation and hematologic  evaluation is recommended   03/27/2019 Procedure    Technically successful CT-guided biopsy of sacral soft tissue mass.   04/04/2019 Initial Diagnosis   Multiple myeloma without remission ([place])   04/11/2019 PET scan   IMPRESSION: 1. Previously noted expansile lesions involving the L5 vertebra and sacrum are again noted and exhibit intense FDG uptake compatible with metabolically active disease. A third focus of increased uptake without corresponding CT abnormality is noted within the intertrochanteric portions of the proximal right femur. 2. Small lucent lesion within the T3 vertebra is noted without corresponding FDG uptake. 3. Asymmetric left bladder wall thickening, etiology indeterminate. 4. Aortic Atherosclerosis (ICD10-I70.0). Lad coronary artery calcification.   04/12/2019 Cancer Staging   Staging form: Plasma Cell Myeloma and Plasma Cell Disorders, AJCC 8th Edition - Clinical stage from 04/12/2019: Beta-2-microglobulin (mg/L): 2, Albumin (g/dL): 3.6, ISS: Stage I, High-risk cytogenetics: Unknown, LDH: Unknown - Signed by Heath Lark, MD on 04/12/2019   04/14/2019 Bone Marrow Biopsy   Bone Marrow, Aspirate,Biopsy, and Clot, right iliac bone - PLASMA CELL MYELOMA.   04/17/2019 - 07/11/2019 Chemotherapy   The patient had bortezemib, revlimid and dexamethasone for chemotherapy treatment.     08/17/2019 Bone Marrow Transplant   He received high dose melphalan followed by autologous stem cell transplant at [facility]   11/22/2019 Bone Marrow Biopsy   BONE MARROW biopsy at [facility]:       Mild monoclonal plasmacytosis (approximately 2%), kappa light chain restricted.    11/22/2019 PET scan   PET CT at [facility] 1.  Similar size and appearance of mildly hypermetabolic lytic lesion in the L5 vertebral body and posterior elements.  2.  Otherwise, no substantial FDG uptake compared to background bone marrow in the additional osseous lucent lesions.    12/20/2019 - 05/09/2020 Radiation Therapy   Radiation Treatment Dates: 12/20/2019 through  01/08/2020 Site Technique Total Dose (Gy) Dose per Fx (Gy) Completed Fx Beam Energies  Lumbar Spine: Spine 3D 35/35 2.5 14/14 15X        02/29/2020 PET scan   1. Similar size and appearance of expansile lytic lesion in the L5 vertebral body and posterior elements with minimal FDG activity.  2. Otherwise, no additional osseous lucent lesions with FDG uptake above background bone marrow   05/31/2020 -  Chemotherapy   The patient had Revlimid for chemotherapy treatment.       REVIEW OF SYSTEMS:   Constitutional: Denies fevers, chills or abnormal weight loss Eyes: Denies blurriness of vision Ears, nose, mouth, throat, and face: Denies mucositis or sore throat Respiratory: Denies cough, dyspnea or wheezes Cardiovascular: Denies palpitation, chest discomfort or lower extremity swelling Gastrointestinal:  Denies nausea, heartburn or change in bowel habits Skin: Denies abnormal skin rashes Lymphatics: Denies new lymphadenopathy or easy bruising Neurological:Denies numbness, tingling or new weaknesses Behavioral/Psych: Mood is stable, no new changes  All other systems were reviewed with the patient and are negative.  I have reviewed the past medical history, past surgical history, social history and family history with the patient and they are unchanged from previous note.  ALLERGIES:  has No Known Allergies.  MEDICATIONS:  Current Outpatient Medications  Medication Sig Dispense Refill  . acetaminophen (TYLENOL) 500 MG tablet Take 500 mg by mouth every 6 (six) hours as needed.    Marland Kitchen acyclovir (ZOVIRAX) 400 MG tablet Take 1 tablet (400 mg total) by mouth 2 (two) times daily. 60 tablet 11  . aspirin 325 MG tablet Take 325 mg by mouth daily.    Marland Kitchen  calcium carbonate (TUMS - DOSED IN MG ELEMENTAL CALCIUM) 500 MG chewable tablet Chew 1 tablet by mouth 2 (two) times daily.    . cholecalciferol (VITAMIN D3) 25 MCG (1000 UT) tablet Take 2,000 Units by mouth daily.    . folic acid (FOLVITE) 1 MG  tablet Take 1 mg by mouth daily.    Marland Kitchen lenalidomide (REVLIMID) 20 MG capsule Take 1 capsule for 21 days, then stop for 7 days for a cycle of every 28 days 21 capsule 11  . lisinopril-hydrochlorothiazide (ZESTORETIC) 20-12.5 MG tablet Take 1 tablet by mouth daily. 90 tablet 3  . Multiple Vitamin (MULTI-VITAMIN) tablet Take by mouth.    . ondansetron (ZOFRAN) 8 MG tablet Take 1 tablet (8 mg total) by mouth 2 (two) times daily as needed (Nausea or vomiting). 30 tablet 1  . prochlorperazine (COMPAZINE) 10 MG tablet Take 1 tablet (10 mg total) by mouth every 6 (six) hours as needed (Nausea or vomiting). 30 tablet 1  . traMADol (ULTRAM) 50 MG tablet Take 1 tablet (50 mg total) by mouth every 6 (six) hours as needed. 60 tablet 0  . vitamin B-12 (CYANOCOBALAMIN) 1000 MCG tablet Take 1,000 mcg by mouth daily.     No current facility-administered medications for this visit.    PHYSICAL EXAMINATION: ECOG PERFORMANCE STATUS: 0 - Asymptomatic  Vitals:   07/22/20 0800  BP: (!) 141/78  Pulse: 67  Resp: 18  Temp: 98 F (36.7 C)  SpO2: 98%   Filed Weights   07/22/20 0800  Weight: 235 lb (106.6 kg)    GENERAL:alert, no distress and comfortable SKIN: skin color, texture, turgor are normal, no rashes or significant lesions EYES: normal, Conjunctiva are pink and non-injected, sclera clear OROPHARYNX:no exudate, no erythema and lips, buccal mucosa, and tongue normal  NECK: supple, thyroid normal size, non-tender, without nodularity LYMPH:  no palpable lymphadenopathy in the cervical, axillary or inguinal LUNGS: clear to auscultation and percussion with normal breathing effort HEART: regular rate & rhythm and no murmurs and no lower extremity edema ABDOMEN:abdomen soft, non-tender and normal bowel sounds Musculoskeletal:no cyanosis of digits and no clubbing  NEURO: alert & oriented x 3 with fluent speech, no focal motor/sensory deficits  LABORATORY DATA:  I have reviewed the data as listed     Component Value Date/Time   NA 139 07/22/2020 0743   NA 139 10/11/2018 1115   NA 139 04/05/2014 0849   K 3.9 07/22/2020 0743   K 4.4 04/05/2014 0849   CL 106 07/22/2020 0743   CO2 27 07/22/2020 0743   CO2 23 04/05/2014 0849   GLUCOSE 104 (H) 07/22/2020 0743   GLUCOSE 109 04/05/2014 0849   BUN 16 07/22/2020 0743   BUN 23 10/11/2018 1115   BUN 16.8 04/05/2014 0849   CREATININE 0.76 07/22/2020 0743   CREATININE 0.91 07/29/2017 1004   CREATININE 0.9 04/05/2014 0849   CALCIUM 8.1 (L) 07/22/2020 0743   CALCIUM 8.7 04/05/2014 0849   PROT 6.0 (L) 07/22/2020 0743   PROT 7.2 10/11/2018 1115   PROT 6.7 04/05/2014 0849   ALBUMIN 3.4 (L) 07/22/2020 0743   ALBUMIN 4.7 10/11/2018 1115   ALBUMIN 3.8 04/05/2014 0849   AST 33 07/22/2020 0743   AST 26 04/05/2014 0849   ALT 41 07/22/2020 0743   ALT 30 04/05/2014 0849   ALKPHOS 51 07/22/2020 0743   ALKPHOS 75 04/05/2014 0849   BILITOT 0.8 07/22/2020 0743   BILITOT 0.71 04/05/2014 0849   GFRNONAA >60 07/22/2020 6701  GFRAA >60 06/14/2020 0830    No results found for: SPEP, UPEP  Lab Results  Component Value Date   WBC 3.4 (L) 07/22/2020   NEUTROABS 1.2 (L) 07/22/2020   HGB 13.8 07/22/2020   HCT 40.3 07/22/2020   MCV 101.8 (H) 07/22/2020   PLT 173 07/22/2020      Chemistry      Component Value Date/Time   NA 139 07/22/2020 0743   NA 139 10/11/2018 1115   NA 139 04/05/2014 0849   K 3.9 07/22/2020 0743   K 4.4 04/05/2014 0849   CL 106 07/22/2020 0743   CO2 27 07/22/2020 0743   CO2 23 04/05/2014 0849   BUN 16 07/22/2020 0743   BUN 23 10/11/2018 1115   BUN 16.8 04/05/2014 0849   CREATININE 0.76 07/22/2020 0743   CREATININE 0.91 07/29/2017 1004   CREATININE 0.9 04/05/2014 0849   GLU 10 04/10/2014 0000      Component Value Date/Time   CALCIUM 8.1 (L) 07/22/2020 0743   CALCIUM 8.7 04/05/2014 0849   ALKPHOS 51 07/22/2020 0743   ALKPHOS 75 04/05/2014 0849   AST 33 07/22/2020 0743   AST 26 04/05/2014 0849   ALT 41  07/22/2020 0743   ALT 30 04/05/2014 0849   BILITOT 0.8 07/22/2020 0743   BILITOT 0.71 04/05/2014 0849

## 2020-07-22 NOTE — Telephone Encounter (Signed)
Scheduled appointments per 10/18 scheduling message. Spoke to patient who is aware of appointments date and times. Gave patient calendar print out.

## 2020-07-23 LAB — KAPPA/LAMBDA LIGHT CHAINS
Kappa free light chain: 31.8 mg/L — ABNORMAL HIGH (ref 3.3–19.4)
Kappa, lambda light chain ratio: 1.5 (ref 0.26–1.65)
Lambda free light chains: 21.2 mg/L (ref 5.7–26.3)

## 2020-07-24 ENCOUNTER — Telehealth: Payer: Self-pay

## 2020-07-24 LAB — MULTIPLE MYELOMA PANEL, SERUM
Albumin SerPl Elph-Mcnc: 3.3 g/dL (ref 2.9–4.4)
Albumin/Glob SerPl: 1.4 (ref 0.7–1.7)
Alpha 1: 0.2 g/dL (ref 0.0–0.4)
Alpha2 Glob SerPl Elph-Mcnc: 0.6 g/dL (ref 0.4–1.0)
B-Globulin SerPl Elph-Mcnc: 0.8 g/dL (ref 0.7–1.3)
Gamma Glob SerPl Elph-Mcnc: 0.8 g/dL (ref 0.4–1.8)
Globulin, Total: 2.4 g/dL (ref 2.2–3.9)
IgA: 53 mg/dL — ABNORMAL LOW (ref 61–437)
IgG (Immunoglobin G), Serum: 892 mg/dL (ref 603–1613)
IgM (Immunoglobulin M), Srm: 21 mg/dL (ref 20–172)
M Protein SerPl Elph-Mcnc: 0.3 g/dL — ABNORMAL HIGH
Total Protein ELP: 5.7 g/dL — ABNORMAL LOW (ref 6.0–8.5)

## 2020-07-24 NOTE — Telephone Encounter (Signed)
Called and left below message. He verbalized understanding.

## 2020-07-24 NOTE — Telephone Encounter (Signed)
-----   Message from Heath Lark, MD sent at 07/24/2020  3:47 PM EDT ----- Regarding: myeloma panel Pls let him know myeloma panel looks stable/improved

## 2020-08-13 ENCOUNTER — Other Ambulatory Visit: Payer: Self-pay

## 2020-08-13 DIAGNOSIS — C9000 Multiple myeloma not having achieved remission: Secondary | ICD-10-CM

## 2020-08-13 MED ORDER — LENALIDOMIDE 20 MG PO CAPS: ORAL_CAPSULE | ORAL | 11 refills | Status: DC

## 2020-08-19 DIAGNOSIS — Z9484 Stem cells transplant status: Secondary | ICD-10-CM | POA: Diagnosis not present

## 2020-08-19 DIAGNOSIS — C9000 Multiple myeloma not having achieved remission: Secondary | ICD-10-CM | POA: Diagnosis not present

## 2020-08-24 ENCOUNTER — Other Ambulatory Visit: Payer: Self-pay | Admitting: Family Medicine

## 2020-08-27 DIAGNOSIS — G629 Polyneuropathy, unspecified: Secondary | ICD-10-CM | POA: Diagnosis not present

## 2020-08-27 DIAGNOSIS — Z23 Encounter for immunization: Secondary | ICD-10-CM | POA: Diagnosis not present

## 2020-08-27 DIAGNOSIS — C9000 Multiple myeloma not having achieved remission: Secondary | ICD-10-CM | POA: Diagnosis not present

## 2020-08-27 DIAGNOSIS — Z9484 Stem cells transplant status: Secondary | ICD-10-CM | POA: Diagnosis not present

## 2020-09-09 ENCOUNTER — Encounter: Payer: Self-pay | Admitting: Hematology and Oncology

## 2020-09-09 ENCOUNTER — Inpatient Hospital Stay: Payer: Medicare Other

## 2020-09-09 ENCOUNTER — Other Ambulatory Visit: Payer: Self-pay

## 2020-09-09 ENCOUNTER — Inpatient Hospital Stay: Payer: Medicare Other | Attending: Hematology and Oncology

## 2020-09-09 ENCOUNTER — Inpatient Hospital Stay (HOSPITAL_BASED_OUTPATIENT_CLINIC_OR_DEPARTMENT_OTHER): Payer: Medicare Other | Admitting: Hematology and Oncology

## 2020-09-09 DIAGNOSIS — G8929 Other chronic pain: Secondary | ICD-10-CM | POA: Diagnosis not present

## 2020-09-09 DIAGNOSIS — D61818 Other pancytopenia: Secondary | ICD-10-CM | POA: Insufficient documentation

## 2020-09-09 DIAGNOSIS — I700 Atherosclerosis of aorta: Secondary | ICD-10-CM | POA: Insufficient documentation

## 2020-09-09 DIAGNOSIS — Z7982 Long term (current) use of aspirin: Secondary | ICD-10-CM | POA: Diagnosis not present

## 2020-09-09 DIAGNOSIS — C9000 Multiple myeloma not having achieved remission: Secondary | ICD-10-CM | POA: Diagnosis not present

## 2020-09-09 DIAGNOSIS — M25562 Pain in left knee: Secondary | ICD-10-CM | POA: Insufficient documentation

## 2020-09-09 DIAGNOSIS — Z9221 Personal history of antineoplastic chemotherapy: Secondary | ICD-10-CM | POA: Insufficient documentation

## 2020-09-09 DIAGNOSIS — I2510 Atherosclerotic heart disease of native coronary artery without angina pectoris: Secondary | ICD-10-CM | POA: Diagnosis not present

## 2020-09-09 DIAGNOSIS — Z923 Personal history of irradiation: Secondary | ICD-10-CM | POA: Diagnosis not present

## 2020-09-09 DIAGNOSIS — Z79899 Other long term (current) drug therapy: Secondary | ICD-10-CM | POA: Diagnosis not present

## 2020-09-09 LAB — CBC WITH DIFFERENTIAL (CANCER CENTER ONLY)
Abs Immature Granulocytes: 0.01 10*3/uL (ref 0.00–0.07)
Basophils Absolute: 0 10*3/uL (ref 0.0–0.1)
Basophils Relative: 1 %
Eosinophils Absolute: 0.3 10*3/uL (ref 0.0–0.5)
Eosinophils Relative: 7 %
HCT: 39.5 % (ref 39.0–52.0)
Hemoglobin: 13.8 g/dL (ref 13.0–17.0)
Immature Granulocytes: 0 %
Lymphocytes Relative: 42 %
Lymphs Abs: 1.6 10*3/uL (ref 0.7–4.0)
MCH: 35.8 pg — ABNORMAL HIGH (ref 26.0–34.0)
MCHC: 34.9 g/dL (ref 30.0–36.0)
MCV: 102.3 fL — ABNORMAL HIGH (ref 80.0–100.0)
Monocytes Absolute: 0.6 10*3/uL (ref 0.1–1.0)
Monocytes Relative: 15 %
Neutro Abs: 1.3 10*3/uL — ABNORMAL LOW (ref 1.7–7.7)
Neutrophils Relative %: 35 %
Platelet Count: 147 10*3/uL — ABNORMAL LOW (ref 150–400)
RBC: 3.86 MIL/uL — ABNORMAL LOW (ref 4.22–5.81)
RDW: 13.7 % (ref 11.5–15.5)
WBC Count: 3.8 10*3/uL — ABNORMAL LOW (ref 4.0–10.5)
nRBC: 0 % (ref 0.0–0.2)

## 2020-09-09 LAB — CMP (CANCER CENTER ONLY)
ALT: 44 U/L (ref 0–44)
AST: 36 U/L (ref 15–41)
Albumin: 3.4 g/dL — ABNORMAL LOW (ref 3.5–5.0)
Alkaline Phosphatase: 45 U/L (ref 38–126)
Anion gap: 6 (ref 5–15)
BUN: 15 mg/dL (ref 8–23)
CO2: 28 mmol/L (ref 22–32)
Calcium: 8.7 mg/dL — ABNORMAL LOW (ref 8.9–10.3)
Chloride: 104 mmol/L (ref 98–111)
Creatinine: 0.84 mg/dL (ref 0.61–1.24)
GFR, Estimated: >60 mL/min (ref >60)
Glucose, Bld: 107 mg/dL — ABNORMAL HIGH (ref 70–99)
Potassium: 3.9 mmol/L (ref 3.5–5.1)
Sodium: 138 mmol/L (ref 135–145)
Total Bilirubin: 0.7 mg/dL (ref 0.3–1.2)
Total Protein: 6.3 g/dL — ABNORMAL LOW (ref 6.5–8.1)

## 2020-09-09 MED ORDER — LENALIDOMIDE 15 MG PO CAPS: ORAL_CAPSULE | ORAL | 11 refills | Status: DC

## 2020-09-09 MED ORDER — DENOSUMAB 120 MG/1.7ML ~~LOC~~ SOLN
SUBCUTANEOUS | Status: AC
  Filled 2020-09-09: qty 1.7

## 2020-09-09 MED ORDER — DENOSUMAB 120 MG/1.7ML ~~LOC~~ SOLN
120.0000 mg | Freq: Once | SUBCUTANEOUS | Status: AC
  Administered 2020-09-09: 120 mg via SUBCUTANEOUS

## 2020-09-09 NOTE — Assessment & Plan Note (Signed)
He tolerated treatment very well His blood counts are stable except for mild pancytopenia I have reviewed information from [facility] I plan to reduce his Revlimid to 15 mg, to be taken daily for 21 days and then rest 7 days starting next cycle We will continue to monitor his myeloma panel closely once a month with the new dose adjustment Once I concluded his myeloma panel was stable, I can space out the interval of his visits I recommend him to continue on acyclovir for antimicrobial prophylaxis He will continue calcium with vitamin D and Xgeva every 3 months, due today He denies recent dental problems

## 2020-09-09 NOTE — Progress Notes (Signed)
Per MD ok to proceed with injection today with calcium of 8.7.

## 2020-09-09 NOTE — Assessment & Plan Note (Signed)
He has chronic left knee pain He has appointment to see orthopedic surgeon in a few days There is no contraindication for him to proceed with knee surgery if indicated I will have to adjust his medications around future surgery if this is what is recommended by orthopedic surgeon If he needs injection only, he does not need to hold his treatment

## 2020-09-09 NOTE — Assessment & Plan Note (Signed)
Due to side effects of treatment He is not symptomatic Observe only for now As above, plan to reduce Revlimid to 15 mg next cycle

## 2020-09-09 NOTE — Progress Notes (Signed)
[facility] OFFICE PROGRESS NOTE  Patient Care Team: Denita Lung, MD as PCP - General (Family Medicine) Heath Lark, MD as Consulting Physician (Hematology and Oncology)  ASSESSMENT & PLAN:  Multiple myeloma without remission ([place]) He tolerated treatment very well His blood counts are stable except for mild pancytopenia I have reviewed information from [facility] I plan to reduce his Revlimid to 15 mg, to be taken daily for 21 days and then rest 7 days starting next cycle We will continue to monitor his myeloma panel closely once a month with the new dose adjustment Once I concluded his myeloma panel was stable, I can space out the interval of his visits I recommend him to continue on acyclovir for antimicrobial prophylaxis He will continue calcium with vitamin D and Xgeva every 3 months, due today He denies recent dental problems   Pancytopenia, acquired ([place]) Due to side effects of treatment He is not symptomatic Observe only for now As above, plan to reduce Revlimid to 15 mg next cycle  Left knee pain He has chronic left knee pain He has appointment to see orthopedic surgeon in a few days There is no contraindication for him to proceed with knee surgery if indicated I will have to adjust his medications around future surgery if this is what is recommended by orthopedic surgeon If he needs injection only, he does not need to hold his treatment   No orders of the defined types were placed in this encounter.   All questions were answered. The patient knows to call the clinic with any problems, questions or concerns. The total time spent in the appointment was 20 minutes encounter with patients including review of chart and various tests results, discussions about plan of care and coordination of care plan   Heath Lark, MD 09/09/2020 11:20 AM  INTERVAL HISTORY: Please see below for problem oriented charting. He returns for further follow-up He is doing  well except for chronic left knee pain He has appointment to see orthopedic surgeon No recent infection, fever or chills No new dental issues I have reviewed recommendation from [place]: Oncology History  Multiple myeloma without remission ([place])  03/20/2019 Imaging   1. Tumor involving the L5 and S1 and S2 segments of the spine as described with mass effects upon the left L4, L5, S1 and more distal left sacral nerves as described above. 2. Does the patient have a history of malignancy? 3. Multiple plasmacytomas and metastatic disease could give this appearance   03/27/2019 Pathology Results   Soft Tissue Needle Core Biopsy, sacral mass - PLASMABLASTIC NEOPLASM. - SEE COMMENT. Microscopic Comment The sections show needle core biopsy fragments of soft tissue densely infiltrated by a relatively monomorphic infiltrate of atypical plasmacytoid cells characterized by vesicular or partially clumped chromatin and prominent nucleoli. This is associated with scattered mitosis. A battery of immunohistochemical stains was performed and show that the atypical plasmacytoid cells are positive for CD138, CD43 and cytoplasmic kappa. There is also weak positivity for CD56 and partial variable positivity for cyclin D1. The atypical plasmacytoid cells are negative for cytoplasmic lambda, CD10, PAX5, CD79a, CD20, CD3, CD5, CD34, EBV (ISH) and mostly negative for LCA. The overall morphologic and histologic features are most compatible with a plasmablastic neoplasm. The differential diagnosis includes plasmablastic lymphoma and plasmablastic plasmacytoma/myeloma. Based on the overall phenotypic features and the clinical setting, plasmacytoma/myeloma is favored. Clinical correlation and hematologic evaluation is recommended   03/27/2019 Procedure  Technically successful CT-guided biopsy of sacral soft tissue mass.   04/04/2019 Initial Diagnosis   Multiple myeloma without remission  ([place])   04/11/2019 PET scan   IMPRESSION: 1. Previously noted expansile lesions involving the L5 vertebra and sacrum are again noted and exhibit intense FDG uptake compatible with metabolically active disease. A third focus of increased uptake without corresponding CT abnormality is noted within the intertrochanteric portions of the proximal right femur. 2. Small lucent lesion within the T3 vertebra is noted without corresponding FDG uptake. 3. Asymmetric left bladder wall thickening, etiology indeterminate. 4. Aortic Atherosclerosis (ICD10-I70.0). Lad coronary artery calcification.   04/12/2019 Cancer Staging   Staging form: Plasma Cell Myeloma and Plasma Cell Disorders, AJCC 8th Edition - Clinical stage from 04/12/2019: Beta-2-microglobulin (mg/L): 2, Albumin (g/dL): 3.6, ISS: Stage I, High-risk cytogenetics: Unknown, LDH: Unknown - Signed by Heath Lark, MD on 04/12/2019   04/14/2019 Bone Marrow Biopsy   Bone Marrow, Aspirate,Biopsy, and Clot, right iliac bone - PLASMA CELL MYELOMA.   04/17/2019 - 07/11/2019 Chemotherapy   The patient had bortezemib, revlimid and dexamethasone for chemotherapy treatment.     08/17/2019 Bone Marrow Transplant   He received high dose melphalan followed by autologous stem cell transplant at [facility]   11/22/2019 Bone Marrow Biopsy   BONE MARROW biopsy at [facility]:       Mild monoclonal plasmacytosis (approximately 2%), kappa light chain restricted.    11/22/2019 PET scan   PET CT at [facility] 1.  Similar size and appearance of mildly hypermetabolic lytic lesion in the L5 vertebral body and posterior elements.  2.  Otherwise, no substantial FDG uptake compared to background bone marrow in the additional osseous lucent lesions.    12/20/2019 - 05/09/2020 Radiation Therapy   Radiation Treatment Dates: 12/20/2019 through 01/08/2020 Site Technique Total Dose (Gy) Dose per Fx (Gy) Completed Fx Beam Energies  Lumbar Spine: Spine 3D 35/35 2.5 14/14 15X         02/29/2020 PET scan   1. Similar size and appearance of expansile lytic lesion in the L5 vertebral body and posterior elements with minimal FDG activity.  2. Otherwise, no additional osseous lucent lesions with FDG uptake above background bone marrow   05/31/2020 -  Chemotherapy   The patient had Revlimid for chemotherapy treatment.       REVIEW OF SYSTEMS:   Constitutional: Denies fevers, chills or abnormal weight loss Eyes: Denies blurriness of vision Ears, nose, mouth, throat, and face: Denies mucositis or sore throat Respiratory: Denies cough, dyspnea or wheezes Cardiovascular: Denies palpitation, chest discomfort or lower extremity swelling Gastrointestinal:  Denies nausea, heartburn or change in bowel habits Skin: Denies abnormal skin rashes Lymphatics: Denies new lymphadenopathy or easy bruising Neurological:Denies numbness, tingling or new weaknesses Behavioral/Psych: Mood is stable, no new changes  All other systems were reviewed with the patient and are negative.  I have reviewed the past medical history, past surgical history, social history and family history with the patient and they are unchanged from previous note.  ALLERGIES:  has No Known Allergies.  MEDICATIONS:  Current Outpatient Medications  Medication Sig Dispense Refill  . acetaminophen (TYLENOL) 500 MG tablet Take 500 mg by mouth every 6 (six) hours as needed.    Marland Kitchen acyclovir (ZOVIRAX) 400 MG tablet Take 1 tablet (400 mg total) by mouth 2 (two) times daily. 60 tablet 11  . aspirin 325 MG tablet Take 325 mg by mouth daily.    . calcium carbonate (TUMS - DOSED IN  MG ELEMENTAL CALCIUM) 500 MG chewable tablet Chew 1 tablet by mouth 2 (two) times daily.    . cholecalciferol (VITAMIN D3) 25 MCG (1000 UT) tablet Take 2,000 Units by mouth daily.    . folic acid (FOLVITE) 1 MG tablet Take 1 mg by mouth daily.    Marland Kitchen lenalidomide (REVLIMID) 15 MG capsule Take 1 capsule for 21 days, then stop for 7 days for a cycle of  every 28 days 21 capsule 11  . lisinopril-hydrochlorothiazide (ZESTORETIC) 20-12.5 MG tablet TAKE 1 TABLET BY MOUTH DAILY 90 tablet 0  . Multiple Vitamin (MULTI-VITAMIN) tablet Take by mouth.    . ondansetron (ZOFRAN) 8 MG tablet Take 1 tablet (8 mg total) by mouth 2 (two) times daily as needed (Nausea or vomiting). 30 tablet 1  . prochlorperazine (COMPAZINE) 10 MG tablet Take 1 tablet (10 mg total) by mouth every 6 (six) hours as needed (Nausea or vomiting). 30 tablet 1  . vitamin B-12 (CYANOCOBALAMIN) 1000 MCG tablet Take 1,000 mcg by mouth daily.     No current facility-administered medications for this visit.    PHYSICAL EXAMINATION: ECOG PERFORMANCE STATUS: 1 - Symptomatic but completely ambulatory  Vitals:   09/09/20 1045  BP: 116/71  Pulse: 77  Resp: 18  Temp: 98.7 F (37.1 C)  SpO2: 97%   Filed Weights   09/09/20 1045  Weight: 235 lb (106.6 kg)    GENERAL:alert, no distress and comfortable SKIN: skin color, texture, turgor are normal, no rashes or significant lesions EYES: normal, Conjunctiva are pink and non-injected, sclera clear OROPHARYNX:no exudate, no erythema and lips, buccal mucosa, and tongue normal  NECK: supple, thyroid normal size, non-tender, without nodularity LYMPH:  no palpable lymphadenopathy in the cervical, axillary or inguinal LUNGS: clear to auscultation and percussion with normal breathing effort HEART: regular rate & rhythm and no murmurs and no lower extremity edema ABDOMEN:abdomen soft, non-tender and normal bowel sounds Musculoskeletal:no cyanosis of digits and no clubbing  NEURO: alert & oriented x 3 with fluent speech, no focal motor/sensory deficits  LABORATORY DATA:  I have reviewed the data as listed    Component Value Date/Time   NA 138 09/09/2020 1029   NA 139 10/11/2018 1115   NA 139 04/05/2014 0849   K 3.9 09/09/2020 1029   K 4.4 04/05/2014 0849   CL 104 09/09/2020 1029   CO2 28 09/09/2020 1029   CO2 23 04/05/2014 0849    GLUCOSE 107 (H) 09/09/2020 1029   GLUCOSE 109 04/05/2014 0849   BUN 15 09/09/2020 1029   BUN 23 10/11/2018 1115   BUN 16.8 04/05/2014 0849   CREATININE 0.84 09/09/2020 1029   CREATININE 0.91 07/29/2017 1004   CREATININE 0.9 04/05/2014 0849   CALCIUM 8.7 (L) 09/09/2020 1029   CALCIUM 8.7 04/05/2014 0849   PROT 6.3 (L) 09/09/2020 1029   PROT 7.2 10/11/2018 1115   PROT 6.7 04/05/2014 0849   ALBUMIN 3.4 (L) 09/09/2020 1029   ALBUMIN 4.7 10/11/2018 1115   ALBUMIN 3.8 04/05/2014 0849   AST 36 09/09/2020 1029   AST 26 04/05/2014 0849   ALT 44 09/09/2020 1029   ALT 30 04/05/2014 0849   ALKPHOS 45 09/09/2020 1029   ALKPHOS 75 04/05/2014 0849   BILITOT 0.7 09/09/2020 1029   BILITOT 0.71 04/05/2014 0849   GFRNONAA >60 09/09/2020 1029   GFRAA >60 06/14/2020 0830    No results found for: SPEP, UPEP  Lab Results  Component Value Date   WBC 3.8 (L) 09/09/2020  NEUTROABS 1.3 (L) 09/09/2020   HGB 13.8 09/09/2020   HCT 39.5 09/09/2020   MCV 102.3 (H) 09/09/2020   PLT 147 (L) 09/09/2020      Chemistry      Component Value Date/Time   NA 138 09/09/2020 1029   NA 139 10/11/2018 1115   NA 139 04/05/2014 0849   K 3.9 09/09/2020 1029   K 4.4 04/05/2014 0849   CL 104 09/09/2020 1029   CO2 28 09/09/2020 1029   CO2 23 04/05/2014 0849   BUN 15 09/09/2020 1029   BUN 23 10/11/2018 1115   BUN 16.8 04/05/2014 0849   CREATININE 0.84 09/09/2020 1029   CREATININE 0.91 07/29/2017 1004   CREATININE 0.9 04/05/2014 0849   GLU 10 04/10/2014 0000      Component Value Date/Time   CALCIUM 8.7 (L) 09/09/2020 1029   CALCIUM 8.7 04/05/2014 0849   ALKPHOS 45 09/09/2020 1029   ALKPHOS 75 04/05/2014 0849   AST 36 09/09/2020 1029   AST 26 04/05/2014 0849   ALT 44 09/09/2020 1029   ALT 30 04/05/2014 0849   BILITOT 0.7 09/09/2020 1029   BILITOT 0.71 04/05/2014 0849

## 2020-09-09 NOTE — Patient Instructions (Signed)
Denosumab injection What is this medicine? DENOSUMAB (den oh sue mab) slows bone breakdown. Prolia is used to treat osteoporosis in women after menopause and in men, and in people who are taking corticosteroids for 6 months or more. Xgeva is used to treat a high calcium level due to cancer and to prevent bone fractures and other bone problems caused by multiple myeloma or cancer bone metastases. Xgeva is also used to treat giant cell tumor of the bone. This medicine may be used for other purposes; ask your health care provider or pharmacist if you have questions. COMMON BRAND NAME(S): Prolia, XGEVA What should I tell my health care provider before I take this medicine? They need to know if you have any of these conditions:  dental disease  having surgery or tooth extraction  infection  kidney disease  low levels of calcium or Vitamin D in the blood  malnutrition  on hemodialysis  skin conditions or sensitivity  thyroid or parathyroid disease  an unusual reaction to denosumab, other medicines, foods, dyes, or preservatives  pregnant or trying to get pregnant  breast-feeding How should I use this medicine? This medicine is for injection under the skin. It is given by a health care professional in a hospital or clinic setting. A special MedGuide will be given to you before each treatment. Be sure to read this information carefully each time. For Prolia, talk to your pediatrician regarding the use of this medicine in children. Special care may be needed. For Xgeva, talk to your pediatrician regarding the use of this medicine in children. While this drug may be prescribed for children as young as 13 years for selected conditions, precautions do apply. Overdosage: If you think you have taken too much of this medicine contact a poison control center or emergency room at once. NOTE: This medicine is only for you. Do not share this medicine with others. What if I miss a dose? It is  important not to miss your dose. Call your doctor or health care professional if you are unable to keep an appointment. What may interact with this medicine? Do not take this medicine with any of the following medications:  other medicines containing denosumab This medicine may also interact with the following medications:  medicines that lower your chance of fighting infection  steroid medicines like prednisone or cortisone This list may not describe all possible interactions. Give your health care provider a list of all the medicines, herbs, non-prescription drugs, or dietary supplements you use. Also tell them if you smoke, drink alcohol, or use illegal drugs. Some items may interact with your medicine. What should I watch for while using this medicine? Visit your doctor or health care professional for regular checks on your progress. Your doctor or health care professional may order blood tests and other tests to see how you are doing. Call your doctor or health care professional for advice if you get a fever, chills or sore throat, or other symptoms of a cold or flu. Do not treat yourself. This drug may decrease your body's ability to fight infection. Try to avoid being around people who are sick. You should make sure you get enough calcium and vitamin D while you are taking this medicine, unless your doctor tells you not to. Discuss the foods you eat and the vitamins you take with your health care professional. See your dentist regularly. Brush and floss your teeth as directed. Before you have any dental work done, tell your dentist you are   receiving this medicine. Do not become pregnant while taking this medicine or for 5 months after stopping it. Talk with your doctor or health care professional about your birth control options while taking this medicine. Women should inform their doctor if they wish to become pregnant or think they might be pregnant. There is a potential for serious side  effects to an unborn child. Talk to your health care professional or pharmacist for more information. What side effects may I notice from receiving this medicine? Side effects that you should report to your doctor or health care professional as soon as possible:  allergic reactions like skin rash, itching or hives, swelling of the face, lips, or tongue  bone pain  breathing problems  dizziness  jaw pain, especially after dental work  redness, blistering, peeling of the skin  signs and symptoms of infection like fever or chills; cough; sore throat; pain or trouble passing urine  signs of low calcium like fast heartbeat, muscle cramps or muscle pain; pain, tingling, numbness in the hands or feet; seizures  unusual bleeding or bruising  unusually weak or tired Side effects that usually do not require medical attention (report to your doctor or health care professional if they continue or are bothersome):  constipation  diarrhea  headache  joint pain  loss of appetite  muscle pain  runny nose  tiredness  upset stomach This list may not describe all possible side effects. Call your doctor for medical advice about side effects. You may report side effects to FDA at 1-800-FDA-1088. Where should I keep my medicine? This medicine is only given in a clinic, doctor's office, or other health care setting and will not be stored at home. NOTE: This sheet is a summary. It may not cover all possible information. If you have questions about this medicine, talk to your doctor, pharmacist, or health care provider.  2020 Elsevier/Gold Standard (2018-01-28 16:10:44)

## 2020-09-10 ENCOUNTER — Telehealth: Payer: Self-pay | Admitting: Hematology and Oncology

## 2020-09-10 ENCOUNTER — Other Ambulatory Visit: Payer: Self-pay

## 2020-09-10 DIAGNOSIS — C9000 Multiple myeloma not having achieved remission: Secondary | ICD-10-CM

## 2020-09-10 LAB — MULTIPLE MYELOMA PANEL, SERUM
Albumin SerPl Elph-Mcnc: 3.4 g/dL (ref 2.9–4.4)
Albumin/Glob SerPl: 1.4 (ref 0.7–1.7)
Alpha 1: 0.2 g/dL (ref 0.0–0.4)
Alpha2 Glob SerPl Elph-Mcnc: 0.6 g/dL (ref 0.4–1.0)
B-Globulin SerPl Elph-Mcnc: 0.8 g/dL (ref 0.7–1.3)
Gamma Glob SerPl Elph-Mcnc: 0.9 g/dL (ref 0.4–1.8)
Globulin, Total: 2.5 g/dL (ref 2.2–3.9)
IgA: 77 mg/dL (ref 61–437)
IgG (Immunoglobin G), Serum: 988 mg/dL (ref 603–1613)
IgM (Immunoglobulin M), Srm: 19 mg/dL — ABNORMAL LOW (ref 20–172)
M Protein SerPl Elph-Mcnc: 0.2 g/dL — ABNORMAL HIGH
Total Protein ELP: 5.9 g/dL — ABNORMAL LOW (ref 6.0–8.5)

## 2020-09-10 LAB — KAPPA/LAMBDA LIGHT CHAINS
Kappa free light chain: 36.2 mg/L — ABNORMAL HIGH (ref 3.3–19.4)
Kappa, lambda light chain ratio: 0.97 (ref 0.26–1.65)
Lambda free light chains: 37.5 mg/L — ABNORMAL HIGH (ref 5.7–26.3)

## 2020-09-10 MED ORDER — LENALIDOMIDE 15 MG PO CAPS: ORAL_CAPSULE | ORAL | 11 refills | Status: DC

## 2020-09-10 NOTE — Telephone Encounter (Signed)
Scheduled appt per 12/6 sch msg - pt is aware of appt date and time

## 2020-09-11 DIAGNOSIS — M25562 Pain in left knee: Secondary | ICD-10-CM | POA: Diagnosis not present

## 2020-09-25 ENCOUNTER — Telehealth: Payer: Self-pay

## 2020-09-25 ENCOUNTER — Other Ambulatory Visit: Payer: Self-pay | Admitting: Hematology and Oncology

## 2020-09-25 DIAGNOSIS — G893 Neoplasm related pain (acute) (chronic): Secondary | ICD-10-CM

## 2020-09-25 MED ORDER — HYDROCODONE-ACETAMINOPHEN 5-325 MG PO TABS: 1.0000 | ORAL_TABLET | Freq: Four times a day (QID) | ORAL | 0 refills | Status: DC | PRN

## 2020-09-25 NOTE — Telephone Encounter (Signed)
Patient notified of below.  Verbalized understanding and agreement.  RN encouraged patient to monitor for constipation, and encourage fluids and increase in fiber if symptoms develop.

## 2020-09-25 NOTE — Telephone Encounter (Signed)
Pt called to report new onset of lower back pain. Per patient pain started after receiving last injection of Xgeva on 12/6.    Patient with increased activity of going up and down stairs in preparation for holidays.  Denies any falls or recent trama.    Pt is alternating Tylenol and Ibuprofen with relief, however pain is present once medication "wears off."    RN encourage decreased activity and applying heat to lower back in addition to OTC medications.  Will forward to MD for review for additional recommendations.

## 2020-09-25 NOTE — Telephone Encounter (Signed)
RN returned call, voicemail left for return call.  

## 2020-09-25 NOTE — Telephone Encounter (Signed)
I sent a small supply of hydrocodone to take as needed Watch out for constipation Tell him I am mad at him; he needs to take it easy and let Ron do all the heavy lifting If not better call Monday next week, otherwise I see him early next year as scheduled

## 2020-10-02 ENCOUNTER — Other Ambulatory Visit: Payer: Self-pay | Admitting: Adult Health

## 2020-10-03 ENCOUNTER — Other Ambulatory Visit: Payer: Self-pay | Admitting: Hematology & Oncology

## 2020-10-03 MED ORDER — HYDROCODONE-ACETAMINOPHEN 5-325 MG PO TABS: 1.0000 | ORAL_TABLET | Freq: Four times a day (QID) | ORAL | 0 refills | Status: DC | PRN

## 2020-10-08 ENCOUNTER — Telehealth: Payer: Self-pay

## 2020-10-08 NOTE — Telephone Encounter (Signed)
Called to see how his pain is today. Pain is better. He stopped the Hydrocodone Rx and started taking Advil. Pain is back today and he may start taking the Hydrocodone Rx again. Scheduled virtual office visit for 1/7 at 1140. He is aware of appt time.

## 2020-10-09 ENCOUNTER — Other Ambulatory Visit: Payer: Self-pay

## 2020-10-09 DIAGNOSIS — C9000 Multiple myeloma not having achieved remission: Secondary | ICD-10-CM

## 2020-10-09 MED ORDER — LENALIDOMIDE 15 MG PO CAPS: ORAL_CAPSULE | ORAL | 11 refills | Status: DC

## 2020-10-10 ENCOUNTER — Other Ambulatory Visit: Payer: Self-pay

## 2020-10-10 DIAGNOSIS — C9000 Multiple myeloma not having achieved remission: Secondary | ICD-10-CM

## 2020-10-10 MED ORDER — LENALIDOMIDE 15 MG PO CAPS: 15.0000 mg | ORAL_CAPSULE | Freq: Every day | ORAL | 11 refills | Status: DC

## 2020-10-11 ENCOUNTER — Other Ambulatory Visit: Payer: Self-pay | Admitting: Hematology and Oncology

## 2020-10-11 ENCOUNTER — Other Ambulatory Visit: Payer: Self-pay

## 2020-10-11 ENCOUNTER — Telehealth (HOSPITAL_BASED_OUTPATIENT_CLINIC_OR_DEPARTMENT_OTHER): Payer: Medicare Other | Admitting: Hematology and Oncology

## 2020-10-11 DIAGNOSIS — G893 Neoplasm related pain (acute) (chronic): Secondary | ICD-10-CM

## 2020-10-11 DIAGNOSIS — C9000 Multiple myeloma not having achieved remission: Secondary | ICD-10-CM | POA: Diagnosis not present

## 2020-10-11 DIAGNOSIS — E559 Vitamin D deficiency, unspecified: Secondary | ICD-10-CM

## 2020-10-11 NOTE — Assessment & Plan Note (Addendum)
He tolerated treatment very well His blood counts are stable except for mild pancytopenia His recent myeloma panel shows stable disease control We discussed the risk and benefits of PET CT scan versus MRI Due to his recent back pain, I plan to order MRI lumbar spine He will return next week for labs and see me on 1/18 for review of test results

## 2020-10-11 NOTE — Assessment & Plan Note (Signed)
He has recent acute exacerbation of back pain It is subsiding Due to history of bone involvement, I plan to order imaging He agrees to proceed We discussed refill policy

## 2020-10-11 NOTE — Progress Notes (Signed)
HEMATOLOGY-ONCOLOGY ELECTRONIC VISIT PROGRESS NOTE  Patient Care Team: Denita Lung, MD as PCP - General (Family Medicine) Heath Lark, MD as Consulting Physician (Hematology and Oncology)  I connected with  phone visit today ASSESSMENT & PLAN:  Multiple myeloma without remission ([place]) He tolerated treatment very well His blood counts are stable except for mild pancytopenia His recent myeloma panel shows stable disease control We discussed the risk and benefits of PET CT scan versus MRI Due to his recent back pain, I plan to order MRI lumbar spine He will return next week for labs and see me on 1/18 for review of test results  Cancer associated pain He has recent acute exacerbation of back pain It is subsiding Due to history of bone involvement, I plan to order imaging He agrees to proceed We discussed refill policy   Orders Placed This Encounter  Procedures  . MR Lumbar Spine W Wo Contrast    Standing Status:   Future    Standing Expiration Date:   10/11/2021    Order Specific Question:   If indicated for the ordered procedure, I authorize the administration of contrast media per Radiology protocol    Answer:   Yes    Order Specific Question:   What is the patient's sedation requirement?    Answer:   No Sedation    Order Specific Question:   Does the patient have a pacemaker or implanted devices?    Answer:   No    Order Specific Question:   Use SRS Protocol?    Answer:   No    Order Specific Question:   Preferred imaging location?    Answer:   [facility] (table limit - 550 lbs)  . VITAMIN D 25 Hydroxy (Vit-D Deficiency, Fractures)    Standing Status:   Future    Standing Expiration Date:   10/11/2021    INTERVAL HISTORY: Please see below for problem oriented charting. The purpose of today's visit is to discuss recent acute on chronic back pain The patient thought that the pain was exacerbated by recent Xgeva injection However, when I spoke with the patient  last month, he also recalled possibility of lifting something heavy that aggravated the pain He has no focal neurological deficits The pain is located in the lower back radiating somewhat to the left side It is improving He is only taking hydrocodone once a day along with some ibuprofen that seems to help He is taking his prescription lenalidomide as prescribed No other new bone pain He is wondering about the utility of imaging studies including the possibility of repeating a PET CT scan  SUMMARY OF ONCOLOGIC HISTORY: Oncology History  Multiple myeloma without remission ([place])  03/20/2019 Imaging   1. Tumor involving the L5 and S1 and S2 segments of the spine as described with mass effects upon the left L4, L5, S1 and more distal left sacral nerves as described above. 2. Does the patient have a history of malignancy? 3. Multiple plasmacytomas and metastatic disease could give this appearance   03/27/2019 Pathology Results   Soft Tissue Needle Core Biopsy, sacral mass - PLASMABLASTIC NEOPLASM. - SEE COMMENT. Microscopic Comment The sections show needle core biopsy fragments of soft tissue densely infiltrated by a relatively monomorphic infiltrate of atypical plasmacytoid cells characterized by vesicular or partially clumped chromatin and prominent nucleoli. This is associated with scattered mitosis. A battery of immunohistochemical stains was performed and show that the atypical plasmacytoid cells are positive for CD138, CD43  and cytoplasmic kappa. There is also weak positivity for CD56 and partial variable positivity for cyclin D1. The atypical plasmacytoid cells are negative for cytoplasmic lambda, CD10, PAX5, CD79a, CD20, CD3, CD5, CD34, EBV (ISH) and mostly negative for LCA. The overall morphologic and histologic features are most compatible with a plasmablastic neoplasm. The differential diagnosis includes plasmablastic lymphoma and plasmablastic plasmacytoma/myeloma. Based on the overall  phenotypic features and the clinical setting, plasmacytoma/myeloma is favored. Clinical correlation and hematologic evaluation is recommended   03/27/2019 Procedure   Technically successful CT-guided biopsy of sacral soft tissue mass.   04/04/2019 Initial Diagnosis   Multiple myeloma without remission ([place])   04/11/2019 PET scan   IMPRESSION: 1. Previously noted expansile lesions involving the L5 vertebra and sacrum are again noted and exhibit intense FDG uptake compatible with metabolically active disease. A third focus of increased uptake without corresponding CT abnormality is noted within the intertrochanteric portions of the proximal right femur. 2. Small lucent lesion within the T3 vertebra is noted without corresponding FDG uptake. 3. Asymmetric left bladder wall thickening, etiology indeterminate. 4. Aortic Atherosclerosis (ICD10-I70.0). Lad coronary artery calcification.   04/12/2019 Cancer Staging   Staging form: Plasma Cell Myeloma and Plasma Cell Disorders, AJCC 8th Edition - Clinical stage from 04/12/2019: Beta-2-microglobulin (mg/L): 2, Albumin (g/dL): 3.6, ISS: Stage I, High-risk cytogenetics: Unknown, LDH: Unknown - Signed by Heath Lark, MD on 04/12/2019   04/14/2019 Bone Marrow Biopsy   Bone Marrow, Aspirate,Biopsy, and Clot, right iliac bone - PLASMA CELL MYELOMA.   04/17/2019 - 07/11/2019 Chemotherapy   The patient had bortezemib, revlimid and dexamethasone for chemotherapy treatment.     08/17/2019 Bone Marrow Transplant   He received high dose melphalan followed by autologous stem cell transplant at [facility] [place] [facility]   11/22/2019 Bone Marrow Biopsy   BONE MARROW biopsy at [facility]:       Mild monoclonal plasmacytosis (approximately 2%), kappa light chain restricted.    11/22/2019 PET scan   PET CT at [facility] 1.  Similar size and appearance of mildly hypermetabolic lytic lesion in the L5 vertebral body and posterior elements.  2.  Otherwise, no substantial FDG uptake  compared to background bone marrow in the additional osseous lucent lesions.    12/20/2019 - 05/09/2020 Radiation Therapy   Radiation Treatment Dates: 12/20/2019 through 01/08/2020 Site Technique Total Dose (Gy) Dose per Fx (Gy) Completed Fx Beam Energies  Lumbar Spine: Spine 3D 35/35 2.5 14/14 15X        02/29/2020 PET scan   1. Similar size and appearance of expansile lytic lesion in the L5 vertebral body and posterior elements with minimal FDG activity.  2. Otherwise, no additional osseous lucent lesions with FDG uptake above background bone marrow   05/31/2020 -  Chemotherapy   The patient had Revlimid for chemotherapy treatment.       REVIEW OF SYSTEMS:   Constitutional: Denies fevers, chills or abnormal weight loss Eyes: Denies blurriness of vision Ears, nose, mouth, throat, and face: Denies mucositis or sore throat Respiratory: Denies cough, dyspnea or wheezes Cardiovascular: Denies palpitation, chest discomfort Gastrointestinal:  Denies nausea, heartburn or change in bowel habits Skin: Denies abnormal skin rashes Lymphatics: Denies new lymphadenopathy or easy bruising Neurological:Denies numbness, tingling or new weaknesses Behavioral/Psych: Mood is stable, no new changes  Extremities: No lower extremity edema All other systems were reviewed with the patient and are negative.  I have reviewed the past medical history, past surgical history, social history and family history with the patient  and they are unchanged from previous note.  ALLERGIES:  has No Known Allergies.  MEDICATIONS:  Current Outpatient Medications  Medication Sig Dispense Refill  . acetaminophen (TYLENOL) 500 MG tablet Take 500 mg by mouth every 6 (six) hours as needed.    Marland Kitchen acyclovir (ZOVIRAX) 400 MG tablet Take 1 tablet (400 mg total) by mouth 2 (two) times daily. 60 tablet 11  . aspirin 325 MG tablet Take 325 mg by mouth daily.    . calcium carbonate (TUMS - DOSED IN MG ELEMENTAL CALCIUM) 500 MG chewable  tablet Chew 1 tablet by mouth 2 (two) times daily.    . cholecalciferol (VITAMIN D3) 25 MCG (1000 UT) tablet Take 2,000 Units by mouth daily.    . folic acid (FOLVITE) 1 MG tablet Take 1 mg by mouth daily.    Marland Kitchen HYDROcodone-acetaminophen (NORCO/VICODIN) 5-325 MG tablet Take 1 tablet by mouth every 6 (six) hours as needed for moderate pain. 30 tablet 0  . lenalidomide (REVLIMID) 15 MG capsule Take 1 capsule (15 mg total) by mouth daily. Take 1 capsule for 21 days, then stop for 7 days for a cycle of every 28 days 21 capsule 11  . lisinopril-hydrochlorothiazide (ZESTORETIC) 20-12.5 MG tablet TAKE 1 TABLET BY MOUTH DAILY 90 tablet 0  . Multiple Vitamin (MULTI-VITAMIN) tablet Take by mouth.    . ondansetron (ZOFRAN) 8 MG tablet Take 1 tablet (8 mg total) by mouth 2 (two) times daily as needed (Nausea or vomiting). 30 tablet 1  . prochlorperazine (COMPAZINE) 10 MG tablet Take 1 tablet (10 mg total) by mouth every 6 (six) hours as needed (Nausea or vomiting). 30 tablet 1  . vitamin B-12 (CYANOCOBALAMIN) 1000 MCG tablet Take 1,000 mcg by mouth daily.     No current facility-administered medications for this visit.    PHYSICAL EXAMINATION: ECOG PERFORMANCE STATUS: 1 - Symptomatic but completely ambulatory  LABORATORY DATA:  I have reviewed the data as listed CMP Latest Ref Rng & Units 09/09/2020 07/22/2020 06/14/2020  Glucose 70 - 99 mg/dL 107(H) 104(H) 148(H)  BUN 8 - 23 mg/dL '15 16 18  ' Creatinine 0.61 - 1.24 mg/dL 0.84 0.76 0.88  Sodium 135 - 145 mmol/L 138 139 136  Potassium 3.5 - 5.1 mmol/L 3.9 3.9 4.1  Chloride 98 - 111 mmol/L 104 106 102  CO2 22 - 32 mmol/L '28 27 23  ' Calcium 8.9 - 10.3 mg/dL 8.7(L) 8.1(L) 8.4(L)  Total Protein 6.5 - 8.1 g/dL 6.3(L) 6.0(L) 6.3(L)  Total Bilirubin 0.3 - 1.2 mg/dL 0.7 0.8 1.1  Alkaline Phos 38 - 126 U/L 45 51 57  AST 15 - 41 U/L 36 33 28  ALT 0 - 44 U/L 44 41 37    Lab Results  Component Value Date   WBC 3.8 (L) 09/09/2020   HGB 13.8 09/09/2020   HCT  39.5 09/09/2020   MCV 102.3 (H) 09/09/2020   PLT 147 (L) 09/09/2020   NEUTROABS 1.3 (L) 09/09/2020   I discussed the assessment and treatment plan with the patient. The patient was provided an opportunity to ask questions and all were answered. The patient agreed with the plan and demonstrated an understanding of the instructions. The patient was advised to call back or seek an in-person evaluation if the symptoms worsen or if the condition fails to improve as anticipated.    I spent 20 minutes for the appointment reviewing test results, discuss management and coordination of care.  Heath Lark, MD 10/11/2020 1:05 PM

## 2020-10-14 ENCOUNTER — Inpatient Hospital Stay: Payer: Medicare Other | Attending: Hematology and Oncology

## 2020-10-14 ENCOUNTER — Other Ambulatory Visit: Payer: Medicare Other

## 2020-10-14 ENCOUNTER — Other Ambulatory Visit: Payer: Self-pay

## 2020-10-14 ENCOUNTER — Ambulatory Visit: Payer: Medicare Other | Admitting: Hematology and Oncology

## 2020-10-14 DIAGNOSIS — E559 Vitamin D deficiency, unspecified: Secondary | ICD-10-CM

## 2020-10-14 DIAGNOSIS — C9000 Multiple myeloma not having achieved remission: Secondary | ICD-10-CM | POA: Insufficient documentation

## 2020-10-14 DIAGNOSIS — G893 Neoplasm related pain (acute) (chronic): Secondary | ICD-10-CM

## 2020-10-14 LAB — CMP (CANCER CENTER ONLY)
ALT: 36 U/L (ref 0–44)
AST: 24 U/L (ref 15–41)
Albumin: 3.5 g/dL (ref 3.5–5.0)
Alkaline Phosphatase: 49 U/L (ref 38–126)
Anion gap: 9 (ref 5–15)
BUN: 18 mg/dL (ref 8–23)
CO2: 25 mmol/L (ref 22–32)
Calcium: 8.3 mg/dL — ABNORMAL LOW (ref 8.9–10.3)
Chloride: 105 mmol/L (ref 98–111)
Creatinine: 0.92 mg/dL (ref 0.61–1.24)
GFR, Estimated: >60 mL/min (ref >60)
Glucose, Bld: 135 mg/dL — ABNORMAL HIGH (ref 70–99)
Potassium: 3.8 mmol/L (ref 3.5–5.1)
Sodium: 139 mmol/L (ref 135–145)
Total Bilirubin: 0.9 mg/dL (ref 0.3–1.2)
Total Protein: 6.6 g/dL (ref 6.5–8.1)

## 2020-10-14 LAB — CBC WITH DIFFERENTIAL (CANCER CENTER ONLY)
Abs Immature Granulocytes: 0.01 10*3/uL (ref 0.00–0.07)
Basophils Absolute: 0.1 10*3/uL (ref 0.0–0.1)
Basophils Relative: 2 %
Eosinophils Absolute: 0.2 10*3/uL (ref 0.0–0.5)
Eosinophils Relative: 6 %
HCT: 41.5 % (ref 39.0–52.0)
Hemoglobin: 14.1 g/dL (ref 13.0–17.0)
Immature Granulocytes: 0 %
Lymphocytes Relative: 46 %
Lymphs Abs: 1.6 10*3/uL (ref 0.7–4.0)
MCH: 36.2 pg — ABNORMAL HIGH (ref 26.0–34.0)
MCHC: 34 g/dL (ref 30.0–36.0)
MCV: 106.7 fL — ABNORMAL HIGH (ref 80.0–100.0)
Monocytes Absolute: 0.5 10*3/uL (ref 0.1–1.0)
Monocytes Relative: 15 %
Neutro Abs: 1.1 10*3/uL — ABNORMAL LOW (ref 1.7–7.7)
Neutrophils Relative %: 31 %
Platelet Count: 178 10*3/uL (ref 150–400)
RBC: 3.89 MIL/uL — ABNORMAL LOW (ref 4.22–5.81)
RDW: 14.5 % (ref 11.5–15.5)
WBC Count: 3.5 10*3/uL — ABNORMAL LOW (ref 4.0–10.5)
nRBC: 0 % (ref 0.0–0.2)

## 2020-10-14 LAB — VITAMIN D 25 HYDROXY (VIT D DEFICIENCY, FRACTURES): Vit D, 25-Hydroxy: 48.55 ng/mL (ref 30–100)

## 2020-10-15 LAB — KAPPA/LAMBDA LIGHT CHAINS
Kappa free light chain: 32.5 mg/L — ABNORMAL HIGH (ref 3.3–19.4)
Kappa, lambda light chain ratio: 1.24 (ref 0.26–1.65)
Lambda free light chains: 26.2 mg/L (ref 5.7–26.3)

## 2020-10-16 LAB — MULTIPLE MYELOMA PANEL, SERUM
Albumin SerPl Elph-Mcnc: 3.5 g/dL (ref 2.9–4.4)
Albumin/Glob SerPl: 1.4 (ref 0.7–1.7)
Alpha 1: 0.2 g/dL (ref 0.0–0.4)
Alpha2 Glob SerPl Elph-Mcnc: 0.6 g/dL (ref 0.4–1.0)
B-Globulin SerPl Elph-Mcnc: 0.8 g/dL (ref 0.7–1.3)
Gamma Glob SerPl Elph-Mcnc: 0.9 g/dL (ref 0.4–1.8)
Globulin, Total: 2.6 g/dL (ref 2.2–3.9)
IgA: 86 mg/dL (ref 61–437)
IgG (Immunoglobin G), Serum: 1015 mg/dL (ref 603–1613)
IgM (Immunoglobulin M), Srm: 22 mg/dL (ref 20–172)
M Protein SerPl Elph-Mcnc: 0.5 g/dL — ABNORMAL HIGH
Total Protein ELP: 6.1 g/dL (ref 6.0–8.5)

## 2020-10-18 ENCOUNTER — Telehealth: Payer: Self-pay

## 2020-10-18 NOTE — Telephone Encounter (Signed)
Called and scheduled appt for 1/25. He prefers virtual visit. He is aware of appt time.

## 2020-10-21 ENCOUNTER — Ambulatory Visit (HOSPITAL_COMMUNITY): Payer: Medicare Other

## 2020-10-22 ENCOUNTER — Ambulatory Visit: Payer: Medicare Other | Admitting: Hematology and Oncology

## 2020-10-28 ENCOUNTER — Other Ambulatory Visit: Payer: Self-pay

## 2020-10-28 ENCOUNTER — Ambulatory Visit (HOSPITAL_COMMUNITY)
Admission: RE | Admit: 2020-10-28 | Discharge: 2020-10-28 | Disposition: A | Discharge status: Home / Self Care | Payer: Medicare Other | Source: Ambulatory Visit | Attending: Hematology and Oncology | Admitting: Hematology and Oncology

## 2020-10-28 DIAGNOSIS — C9000 Multiple myeloma not having achieved remission: Secondary | ICD-10-CM | POA: Insufficient documentation

## 2020-10-28 DIAGNOSIS — M545 Low back pain, unspecified: Secondary | ICD-10-CM | POA: Diagnosis not present

## 2020-10-28 DIAGNOSIS — G893 Neoplasm related pain (acute) (chronic): Secondary | ICD-10-CM | POA: Diagnosis not present

## 2020-10-28 MED ORDER — GADOBUTROL 1 MMOL/ML IV SOLN
10.0000 mL | Freq: Once | INTRAVENOUS | Status: AC | PRN
  Administered 2020-10-28: 10 mL via INTRAVENOUS

## 2020-10-29 ENCOUNTER — Encounter: Payer: Self-pay | Admitting: Hematology and Oncology

## 2020-10-29 ENCOUNTER — Inpatient Hospital Stay (HOSPITAL_BASED_OUTPATIENT_CLINIC_OR_DEPARTMENT_OTHER): Payer: Medicare Other | Admitting: Hematology and Oncology

## 2020-10-29 ENCOUNTER — Telehealth: Payer: Medicare Other | Admitting: Hematology and Oncology

## 2020-10-29 ENCOUNTER — Inpatient Hospital Stay: Payer: Medicare Other | Admitting: Hematology and Oncology

## 2020-10-29 DIAGNOSIS — G893 Neoplasm related pain (acute) (chronic): Secondary | ICD-10-CM

## 2020-10-29 DIAGNOSIS — C9000 Multiple myeloma not having achieved remission: Secondary | ICD-10-CM

## 2020-10-29 DIAGNOSIS — D701 Agranulocytosis secondary to cancer chemotherapy: Secondary | ICD-10-CM | POA: Diagnosis not present

## 2020-10-29 DIAGNOSIS — T451X5A Adverse effect of antineoplastic and immunosuppressive drugs, initial encounter: Secondary | ICD-10-CM | POA: Diagnosis not present

## 2020-10-29 NOTE — Assessment & Plan Note (Signed)
This is likely due to recent treatment. The patient denies recent history of fevers, cough, chills, diarrhea or dysuria. He is asymptomatic from the leukopenia. I will observe for now.  I will continue the chemotherapy at current dose without dosage adjustment.  If the leukopenia gets progressive worse in the future, I might have to delay his treatment or adjust the chemotherapy dose. 

## 2020-10-29 NOTE — Assessment & Plan Note (Signed)
I reviewed results from his recent myeloma panel Even though the M protein is slightly elevated, his light chain was better MRI of the back showed improvement He is not symptomatic I recommend continue current dose of Revlimid I plan to schedule another lab appointment on February 9 and then see him back the following week to review test results If he has signs of disease progression, I will consider switching his treatment to something else

## 2020-10-29 NOTE — Progress Notes (Signed)
HEMATOLOGY-ONCOLOGY ELECTRONIC VISIT PROGRESS NOTE  Patient Care Team: Denita Lung, MD as PCP - General (Family Medicine) Heath Lark, MD as Consulting Physician (Hematology and Oncology)  I connected with by telephone call  ASSESSMENT & PLAN:  Multiple myeloma without remission [facility]) I reviewed results from his recent myeloma panel Even though the M protein is slightly elevated, his light chain was better MRI of the back showed improvement He is not symptomatic I recommend continue current dose of Revlimid I plan to schedule another lab appointment on February 9 and then see him back the following week to review test results If he has signs of disease progression, I will consider switching his treatment to something else  Leukopenia due to antineoplastic chemotherapy [facility]) This is likely due to recent treatment. The patient denies recent history of fevers, cough, chills, diarrhea or dysuria. He is asymptomatic from the leukopenia. I will observe for now.  I will continue the chemotherapy at current dose without dosage adjustment.  If the leukopenia gets progressive worse in the future, I might have to delay his treatment or adjust the chemotherapy dose.   Cancer associated pain His back pain has resolved We discussed the importance of weight loss and lifestyle changes His MRI showed improvement He is reassured   No orders of the defined types were placed in this encounter.   INTERVAL HISTORY: Please see below for problem oriented charting. The purpose of today's visit is to review recent myeloma panel and MRI result He is doing better He is no longer taking pain medicine He takes ibuprofen as needed for his knee He is able to walk and exercise He denies recent infection, fever or chills  SUMMARY OF ONCOLOGIC HISTORY: Oncology History  Multiple myeloma without remission ([place])  03/20/2019 Imaging   1. Tumor involving the L5 and S1 and S2 segments of the spine as  described with mass effects upon the left L4, L5, S1 and more distal left sacral nerves as described above. 2. Does the patient have a history of malignancy? 3. Multiple plasmacytomas and metastatic disease could give this appearance   03/27/2019 Pathology Results   Soft Tissue Needle Core Biopsy, sacral mass - PLASMABLASTIC NEOPLASM. - SEE COMMENT. Microscopic Comment The sections show needle core biopsy fragments of soft tissue densely infiltrated by a relatively monomorphic infiltrate of atypical plasmacytoid cells characterized by vesicular or partially clumped chromatin and prominent nucleoli. This is associated with scattered mitosis. A battery of immunohistochemical stains was performed and show that the atypical plasmacytoid cells are positive for CD138, CD43 and cytoplasmic kappa. There is also weak positivity for CD56 and partial variable positivity for cyclin D1. The atypical plasmacytoid cells are negative for cytoplasmic lambda, CD10, PAX5, CD79a, CD20, CD3, CD5, CD34, EBV (ISH) and mostly negative for LCA. The overall morphologic and histologic features are most compatible with a plasmablastic neoplasm. The differential diagnosis includes plasmablastic lymphoma and plasmablastic plasmacytoma/myeloma. Based on the overall phenotypic features and the clinical setting, plasmacytoma/myeloma is favored. Clinical correlation and hematologic evaluation is recommended   03/27/2019 Procedure   Technically successful CT-guided biopsy of sacral soft tissue mass.   04/04/2019 Initial Diagnosis   Multiple myeloma without remission ([place])   04/11/2019 PET scan   IMPRESSION: 1. Previously noted expansile lesions involving the L5 vertebra and sacrum are again noted and exhibit intense FDG uptake compatible with metabolically active disease. A third focus of increased uptake without corresponding CT abnormality is noted within the intertrochanteric portions of the proximal right  femur. 2. Small lucent  lesion within the T3 vertebra is noted without corresponding FDG uptake. 3. Asymmetric left bladder wall thickening, etiology indeterminate. 4. Aortic Atherosclerosis (ICD10-I70.0). Lad coronary artery calcification.   04/12/2019 Cancer Staging   Staging form: Plasma Cell Myeloma and Plasma Cell Disorders, AJCC 8th Edition - Clinical stage from 04/12/2019: Beta-2-microglobulin (mg/L): 2, Albumin (g/dL): 3.6, ISS: Stage I, High-risk cytogenetics: Unknown, LDH: Unknown - Signed by Heath Lark, MD on 04/12/2019   04/14/2019 Bone Marrow Biopsy   Bone Marrow, Aspirate,Biopsy, and Clot, right iliac bone - PLASMA CELL MYELOMA.   04/17/2019 - 07/11/2019 Chemotherapy   The patient had bortezemib, revlimid and dexamethasone for chemotherapy treatment.     08/17/2019 Bone Marrow Transplant   He received high dose melphalan followed by autologous stem cell transplant at [facility]   11/22/2019 Bone Marrow Biopsy   BONE MARROW biopsy at [facility]:       Mild monoclonal plasmacytosis (approximately 2%), kappa light chain restricted.    11/22/2019 PET scan   PET CT at [facility] 1.  Similar size and appearance of mildly hypermetabolic lytic lesion in the L5 vertebral body and posterior elements.  2.  Otherwise, no substantial FDG uptake compared to background bone marrow in the additional osseous lucent lesions.    12/20/2019 - 05/09/2020 Radiation Therapy   Radiation Treatment Dates: 12/20/2019 through 01/08/2020 Site Technique Total Dose (Gy) Dose per Fx (Gy) Completed Fx Beam Energies  Lumbar Spine: Spine 3D 35/35 2.5 14/14 15X        02/29/2020 PET scan   1. Similar size and appearance of expansile lytic lesion in the L5 vertebral body and posterior elements with minimal FDG activity.  2. Otherwise, no additional osseous lucent lesions with FDG uptake above background bone marrow   05/31/2020 -  Chemotherapy   The patient had Revlimid for chemotherapy treatment.       REVIEW OF SYSTEMS:    Constitutional: Denies fevers, chills or abnormal weight loss Eyes: Denies blurriness of vision Ears, nose, mouth, throat, and face: Denies mucositis or sore throat Respiratory: Denies cough, dyspnea or wheezes Cardiovascular: Denies palpitation, chest discomfort Gastrointestinal:  Denies nausea, heartburn or change in bowel habits Skin: Denies abnormal skin rashes Lymphatics: Denies new lymphadenopathy or easy bruising Neurological:Denies numbness, tingling or new weaknesses Behavioral/Psych: Mood is stable, no new changes  Extremities: No lower extremity edema All other systems were reviewed with the patient and are negative.  I have reviewed the past medical history, past surgical history, social history and family history with the patient and they are unchanged from previous note.  ALLERGIES:  has No Known Allergies.  MEDICATIONS:  Current Outpatient Medications  Medication Sig Dispense Refill  . acetaminophen (TYLENOL) 500 MG tablet Take 500 mg by mouth every 6 (six) hours as needed.    Marland Kitchen acyclovir (ZOVIRAX) 400 MG tablet Take 1 tablet (400 mg total) by mouth 2 (two) times daily. 60 tablet 11  . aspirin 325 MG tablet Take 325 mg by mouth daily.    . calcium carbonate (TUMS - DOSED IN MG ELEMENTAL CALCIUM) 500 MG chewable tablet Chew 1 tablet by mouth 2 (two) times daily.    . cholecalciferol (VITAMIN D3) 25 MCG (1000 UT) tablet Take 2,000 Units by mouth daily.    . folic acid (FOLVITE) 1 MG tablet Take 1 mg by mouth daily.    Marland Kitchen HYDROcodone-acetaminophen (NORCO/VICODIN) 5-325 MG tablet Take 1 tablet by mouth every 6 (six) hours as needed for moderate  pain. 30 tablet 0  . lenalidomide (REVLIMID) 15 MG capsule Take 1 capsule (15 mg total) by mouth daily. Take 1 capsule for 21 days, then stop for 7 days for a cycle of every 28 days 21 capsule 11  . lisinopril-hydrochlorothiazide (ZESTORETIC) 20-12.5 MG tablet TAKE 1 TABLET BY MOUTH DAILY 90 tablet 0  . Multiple Vitamin (MULTI-VITAMIN)  tablet Take by mouth.    . ondansetron (ZOFRAN) 8 MG tablet Take 1 tablet (8 mg total) by mouth 2 (two) times daily as needed (Nausea or vomiting). 30 tablet 1  . prochlorperazine (COMPAZINE) 10 MG tablet Take 1 tablet (10 mg total) by mouth every 6 (six) hours as needed (Nausea or vomiting). 30 tablet 1  . vitamin B-12 (CYANOCOBALAMIN) 1000 MCG tablet Take 1,000 mcg by mouth daily.     No current facility-administered medications for this visit.    PHYSICAL EXAMINATION: ECOG PERFORMANCE STATUS: 0 - Asymptomatic  LABORATORY DATA:  I have reviewed the data as listed CMP Latest Ref Rng & Units 10/14/2020 09/09/2020 07/22/2020  Glucose 70 - 99 mg/dL 135(H) 107(H) 104(H)  BUN 8 - 23 mg/dL _0 Creatinine 0.61 - 1.24 mg/dL 0.92 0.84 0.76  Sodium 135 - 145 mmol/L 139 138 139  Potassium 3.5 - 5.1 mmol/L 3.8 3.9 3.9  Chloride 98 - 111 mmol/L 105 104 106  CO2 22 - 32 mmol/L _1 Calcium 8.9 - 10.3 mg/dL 8.3(L) 8.7(L) 8.1(L)  Total Protein 6.5 - 8.1 g/dL 6.6 6.3(L) 6.0(L)  Total Bilirubin 0.3 - 1.2 mg/dL 0.9 0.7 0.8  Alkaline Phos 38 - 126 U/L 49 45 51  AST 15 - 41 U/L 24 36 33  ALT 0 - 44 U/L 36 44 41    Lab Results  Component Value Date   WBC 3.5 (L) 10/14/2020   HGB 14.1 10/14/2020   HCT 41.5 10/14/2020   MCV 106.7 (H) 10/14/2020   PLT 178 10/14/2020   NEUTROABS 1.1 (L) 10/14/2020     RADIOGRAPHIC STUDIES: I have personally reviewed the radiological images as listed and agreed with the findings in the report. MR Lumbar Spine W Wo Contrast  Result Date: 10/28/2020 CLINICAL DATA:  Hematologic malignancy staging. Multiple myeloma with lower back pain EXAM: MRI LUMBAR SPINE WITHOUT AND WITH CONTRAST TECHNIQUE: Multiplanar and multiecho pulse sequences of the lumbar spine were obtained without and with intravenous contrast. CONTRAST:  63m GADAVIST GADOBUTROL 1 MMOL/ML IV SOLN COMPARISON:  03/20/2019 FINDINGS: Segmentation:  5 lumbar type vertebrae Alignment:  Normal  Vertebrae: Bone lesions in the L5 left body and posterior elements and in the S2 left body and posterior elements has partially contracted and has become cystic appearing with peripheral enhancement and prominent central T2 signal. There is accentuated fatty marrow at L4 and below, attributed to radiotherapy. No active bone lesion is seen. No pathologic fracture or soft tissue tumor. Conus medullaris and cauda equina: Conus extends to the L1 level. Conus and cauda equina appear normal. Paraspinal and other soft tissues: Trabeculated bladder, incidental. Disc levels: T12- L1: Spondylosis with bridging osteophyte. L1-L2: Spondylosis with bridging osteophyte. Tiny right paracentral protrusion and annular fissure. L2-L3: Disc narrowing and bulging with right eccentric ridging. Mild facet spurring. No neural compression. L3-L4: Disc narrowing and bulging with right paracentral inferiorly pointing protrusion which could affect the right L4 nerve root. Mild bilateral foraminal narrowing L4-L5: Disc narrowing and bulging with degenerative facet spurring. The canal and foramina are patent L5-S1:Disc narrowing with intervertebral ankylosis. Ridging of  the endplates causes left foraminal impingement that is improved due to regression of tumor. IMPRESSION: 1. Satisfactorytreated appearance of L5 and sacral lesions which have become cystic and have regressed in size since 2020. No evidence of new or active lesion. 2. L5-S1 left foraminal impingement which is improved due to lesion regression since 2020. 3. L3-4 small right paracentral protrusion which could affect the right L4 nerve root Electronically Signed   By: Monte Fantasia M.D.   On: 10/28/2020 08:10    I discussed the assessment and treatment plan with the patient. The patient was provided an opportunity to ask questions and all were answered. The patient agreed with the plan and demonstrated an understanding of the instructions. The patient was advised to call back  or seek an in-person evaluation if the symptoms worsen or if the condition fails to improve as anticipated.    I spent 20 minutes for the appointment reviewing test results, discuss management and coordination of care.  Heath Lark, MD 10/29/2020 12:33 PM

## 2020-10-29 NOTE — Assessment & Plan Note (Signed)
His back pain has resolved We discussed the importance of weight loss and lifestyle changes His MRI showed improvement He is reassured

## 2020-10-30 ENCOUNTER — Telehealth: Payer: Self-pay | Admitting: Hematology and Oncology

## 2020-10-30 NOTE — Telephone Encounter (Signed)
Scheduled appts per 1/25 sch msg. Pt confirmed appt date and time.  

## 2020-11-05 ENCOUNTER — Other Ambulatory Visit: Payer: Self-pay

## 2020-11-05 DIAGNOSIS — C9000 Multiple myeloma not having achieved remission: Secondary | ICD-10-CM

## 2020-11-05 MED ORDER — LENALIDOMIDE 15 MG PO CAPS: 15.0000 mg | ORAL_CAPSULE | Freq: Every day | ORAL | 0 refills | Status: DC

## 2020-11-13 ENCOUNTER — Other Ambulatory Visit: Payer: Self-pay

## 2020-11-13 ENCOUNTER — Inpatient Hospital Stay: Payer: Medicare Other | Attending: Hematology and Oncology

## 2020-11-13 DIAGNOSIS — Z9481 Bone marrow transplant status: Secondary | ICD-10-CM | POA: Diagnosis not present

## 2020-11-13 DIAGNOSIS — Z79899 Other long term (current) drug therapy: Secondary | ICD-10-CM | POA: Insufficient documentation

## 2020-11-13 DIAGNOSIS — T451X5A Adverse effect of antineoplastic and immunosuppressive drugs, initial encounter: Secondary | ICD-10-CM | POA: Insufficient documentation

## 2020-11-13 DIAGNOSIS — D709 Neutropenia, unspecified: Secondary | ICD-10-CM | POA: Insufficient documentation

## 2020-11-13 DIAGNOSIS — Z7982 Long term (current) use of aspirin: Secondary | ICD-10-CM | POA: Diagnosis not present

## 2020-11-13 DIAGNOSIS — C9000 Multiple myeloma not having achieved remission: Secondary | ICD-10-CM | POA: Diagnosis not present

## 2020-11-13 DIAGNOSIS — I10 Essential (primary) hypertension: Secondary | ICD-10-CM | POA: Diagnosis not present

## 2020-11-13 DIAGNOSIS — E669 Obesity, unspecified: Secondary | ICD-10-CM | POA: Insufficient documentation

## 2020-11-13 DIAGNOSIS — Z923 Personal history of irradiation: Secondary | ICD-10-CM | POA: Diagnosis not present

## 2020-11-13 LAB — CMP (CANCER CENTER ONLY)
ALT: 42 U/L (ref 0–44)
AST: 32 U/L (ref 15–41)
Albumin: 3.9 g/dL (ref 3.5–5.0)
Alkaline Phosphatase: 51 U/L (ref 38–126)
Anion gap: 8 (ref 5–15)
BUN: 23 mg/dL (ref 8–23)
CO2: 25 mmol/L (ref 22–32)
Calcium: 9 mg/dL (ref 8.9–10.3)
Chloride: 104 mmol/L (ref 98–111)
Creatinine: 0.83 mg/dL (ref 0.61–1.24)
GFR, Estimated: >60 mL/min (ref >60)
Glucose, Bld: 79 mg/dL (ref 70–99)
Potassium: 4.1 mmol/L (ref 3.5–5.1)
Sodium: 137 mmol/L (ref 135–145)
Total Bilirubin: 1.1 mg/dL (ref 0.3–1.2)
Total Protein: 7 g/dL (ref 6.5–8.1)

## 2020-11-13 LAB — CBC WITH DIFFERENTIAL (CANCER CENTER ONLY)
Abs Immature Granulocytes: 0.01 10*3/uL (ref 0.00–0.07)
Basophils Absolute: 0.1 10*3/uL (ref 0.0–0.1)
Basophils Relative: 2 %
Eosinophils Absolute: 0.2 10*3/uL (ref 0.0–0.5)
Eosinophils Relative: 5 %
HCT: 42.6 % (ref 39.0–52.0)
Hemoglobin: 14.8 g/dL (ref 13.0–17.0)
Immature Granulocytes: 0 %
Lymphocytes Relative: 44 %
Lymphs Abs: 1.8 10*3/uL (ref 0.7–4.0)
MCH: 36.5 pg — ABNORMAL HIGH (ref 26.0–34.0)
MCHC: 34.7 g/dL (ref 30.0–36.0)
MCV: 104.9 fL — ABNORMAL HIGH (ref 80.0–100.0)
Monocytes Absolute: 0.8 10*3/uL (ref 0.1–1.0)
Monocytes Relative: 19 %
Neutro Abs: 1.2 10*3/uL — ABNORMAL LOW (ref 1.7–7.7)
Neutrophils Relative %: 30 %
Platelet Count: 200 10*3/uL (ref 150–400)
RBC: 4.06 MIL/uL — ABNORMAL LOW (ref 4.22–5.81)
RDW: 13.6 % (ref 11.5–15.5)
WBC Count: 4.1 10*3/uL (ref 4.0–10.5)
nRBC: 0 % (ref 0.0–0.2)

## 2020-11-14 LAB — KAPPA/LAMBDA LIGHT CHAINS
Kappa free light chain: 34.1 mg/L — ABNORMAL HIGH (ref 3.3–19.4)
Kappa, lambda light chain ratio: 1.34 (ref 0.26–1.65)
Lambda free light chains: 25.5 mg/L (ref 5.7–26.3)

## 2020-11-15 LAB — MULTIPLE MYELOMA PANEL, SERUM
Albumin SerPl Elph-Mcnc: 3.8 g/dL (ref 2.9–4.4)
Albumin/Glob SerPl: 1.5 (ref 0.7–1.7)
Alpha 1: 0.2 g/dL (ref 0.0–0.4)
Alpha2 Glob SerPl Elph-Mcnc: 0.6 g/dL (ref 0.4–1.0)
B-Globulin SerPl Elph-Mcnc: 0.9 g/dL (ref 0.7–1.3)
Gamma Glob SerPl Elph-Mcnc: 1 g/dL (ref 0.4–1.8)
Globulin, Total: 2.7 g/dL (ref 2.2–3.9)
IgA: 100 mg/dL (ref 61–437)
IgG (Immunoglobin G), Serum: 1098 mg/dL (ref 603–1613)
IgM (Immunoglobulin M), Srm: 26 mg/dL (ref 20–172)
M Protein SerPl Elph-Mcnc: 0.3 g/dL — ABNORMAL HIGH
Total Protein ELP: 6.5 g/dL (ref 6.0–8.5)

## 2020-11-18 ENCOUNTER — Telehealth: Payer: Self-pay

## 2020-11-18 ENCOUNTER — Telehealth: Payer: Self-pay | Admitting: Hematology and Oncology

## 2020-11-18 ENCOUNTER — Other Ambulatory Visit: Payer: Self-pay

## 2020-11-18 ENCOUNTER — Encounter: Payer: Self-pay | Admitting: Hematology and Oncology

## 2020-11-18 ENCOUNTER — Inpatient Hospital Stay (HOSPITAL_BASED_OUTPATIENT_CLINIC_OR_DEPARTMENT_OTHER): Payer: Medicare Other | Admitting: Hematology and Oncology

## 2020-11-18 DIAGNOSIS — I10 Essential (primary) hypertension: Secondary | ICD-10-CM

## 2020-11-18 DIAGNOSIS — E669 Obesity, unspecified: Secondary | ICD-10-CM

## 2020-11-18 DIAGNOSIS — C9000 Multiple myeloma not having achieved remission: Secondary | ICD-10-CM

## 2020-11-18 DIAGNOSIS — Z9481 Bone marrow transplant status: Secondary | ICD-10-CM | POA: Diagnosis not present

## 2020-11-18 DIAGNOSIS — D702 Other drug-induced agranulocytosis: Secondary | ICD-10-CM | POA: Diagnosis not present

## 2020-11-18 DIAGNOSIS — T451X5A Adverse effect of antineoplastic and immunosuppressive drugs, initial encounter: Secondary | ICD-10-CM | POA: Diagnosis not present

## 2020-11-18 DIAGNOSIS — D709 Neutropenia, unspecified: Secondary | ICD-10-CM | POA: Diagnosis not present

## 2020-11-18 NOTE — Assessment & Plan Note (Signed)
I reviewed his recent myeloma panel with the patient He tolerated 15 mg dose well without major pancytopenia We discussed the risk and benefits of continuing the current dose long-term and he is in agreement He will continue acyclovir for antimicrobial prophylaxis along with aspirin for DVT prophylaxis He will continue calcium with vitamin D I will schedule next dose of Xgeva in April

## 2020-11-18 NOTE — Assessment & Plan Note (Signed)
This is due to Revlimid He is not symptomatic Observe for now We discussed neutropenic precaution especially when he travels 

## 2020-11-18 NOTE — Assessment & Plan Note (Signed)
His blood pressure is profoundly elevated but could be attributed to anxiety I recommend the patient to start checking his blood pressure regularly at home

## 2020-11-18 NOTE — Telephone Encounter (Signed)
Called and scheduled appt for today at 2:45 pm. He is aware of appt time.

## 2020-11-18 NOTE — Progress Notes (Signed)
[facility] OFFICE PROGRESS NOTE  Patient Care Team: Denita Lung, MD as PCP - General (Family Medicine) Heath Lark, MD as Consulting Physician (Hematology and Oncology)  ASSESSMENT & PLAN:  Multiple myeloma without remission [facility]) I reviewed his recent myeloma panel with the patient He tolerated 15 mg dose well without major pancytopenia We discussed the risk and benefits of continuing the current dose long-term and he is in agreement He will continue acyclovir for antimicrobial prophylaxis along with aspirin for DVT prophylaxis He will continue calcium with vitamin D I will schedule next dose of Xgeva in April  Drug-induced neutropenia ([place]) This is due to Revlimid He is not symptomatic Observe for now We discussed neutropenic precaution especially when he travels  Obesity (BMI 30-39.9) We discussed the importance of weight loss and healthy lifestyle changes  Essential hypertension His blood pressure is profoundly elevated but could be attributed to anxiety I recommend the patient to start checking his blood pressure regularly at home   No orders of the defined types were placed in this encounter.   All questions were answered. The patient knows to call the clinic with any problems, questions or concerns. The total time spent in the appointment was 20 minutes encounter with patients including review of chart and various tests results, discussions about plan of care and coordination of care plan   Heath Lark, MD 11/18/2020 3:18 PM  INTERVAL HISTORY: Please see below for problem oriented charting. Who returns for treatment and follow-up He is doing well He denies back pain No recent infection, fever or chills He denies side effects from Revlimid so far He plans to travel to [place] for holiday next month  SUMMARY OF ONCOLOGIC HISTORY: Oncology History  Multiple myeloma without remission ([place])  03/20/2019 Imaging   1. Tumor involving the L5 and S1 and  S2 segments of the spine as described with mass effects upon the left L4, L5, S1 and more distal left sacral nerves as described above. 2. Does the patient have a history of malignancy? 3. Multiple plasmacytomas and metastatic disease could give this appearance   03/27/2019 Pathology Results   Soft Tissue Needle Core Biopsy, sacral mass - PLASMABLASTIC NEOPLASM. - SEE COMMENT. Microscopic Comment The sections show needle core biopsy fragments of soft tissue densely infiltrated by a relatively monomorphic infiltrate of atypical plasmacytoid cells characterized by vesicular or partially clumped chromatin and prominent nucleoli. This is associated with scattered mitosis. A battery of immunohistochemical stains was performed and show that the atypical plasmacytoid cells are positive for CD138, CD43 and cytoplasmic kappa. There is also weak positivity for CD56 and partial variable positivity for cyclin D1. The atypical plasmacytoid cells are negative for cytoplasmic lambda, CD10, PAX5, CD79a, CD20, CD3, CD5, CD34, EBV (ISH) and mostly negative for LCA. The overall morphologic and histologic features are most compatible with a plasmablastic neoplasm. The differential diagnosis includes plasmablastic lymphoma and plasmablastic plasmacytoma/myeloma. Based on the overall phenotypic features and the clinical setting, plasmacytoma/myeloma is favored. Clinical correlation and hematologic evaluation is recommended   03/27/2019 Procedure   Technically successful CT-guided biopsy of sacral soft tissue mass.   04/04/2019 Initial Diagnosis   Multiple myeloma without remission ([place])   04/11/2019 PET scan   IMPRESSION: 1. Previously noted expansile lesions involving the L5 vertebra and sacrum are again noted and exhibit intense FDG uptake compatible with metabolically active disease. A third focus of increased uptake without corresponding CT abnormality is noted within the intertrochanteric portions of the proximal  right femur. 2. Small lucent lesion within the T3 vertebra is noted without corresponding FDG uptake. 3. Asymmetric left bladder wall thickening, etiology indeterminate. 4. Aortic Atherosclerosis (ICD10-I70.0). Lad coronary artery calcification.   04/12/2019 Cancer Staging   Staging form: Plasma Cell Myeloma and Plasma Cell Disorders, AJCC 8th Edition - Clinical stage from 04/12/2019: Beta-2-microglobulin (mg/L): 2, Albumin (g/dL): 3.6, ISS: Stage I, High-risk cytogenetics: Unknown, LDH: Unknown - Signed by Heath Lark, MD on 04/12/2019   04/14/2019 Bone Marrow Biopsy   Bone Marrow, Aspirate,Biopsy, and Clot, right iliac bone - PLASMA CELL MYELOMA.   04/17/2019 - 07/11/2019 Chemotherapy   The patient had bortezemib, revlimid and dexamethasone for chemotherapy treatment.     08/17/2019 Bone Marrow Transplant   He received high dose melphalan followed by autologous stem cell transplant at [facility]   11/22/2019 Bone Marrow Biopsy   BONE MARROW biopsy at [facility]:       Mild monoclonal plasmacytosis (approximately 2%), kappa light chain restricted.    11/22/2019 PET scan   [facility] at [facility] 1.  Similar size and appearance of mildly hypermetabolic lytic lesion in the L5 vertebral body and posterior elements.  2.  Otherwise, no substantial FDG uptake compared to background bone marrow in the additional osseous lucent lesions.    12/20/2019 - 05/09/2020 Radiation Therapy   Radiation Treatment Dates: 12/20/2019 through 01/08/2020 Site Technique Total Dose (Gy) Dose per Fx (Gy) Completed Fx Beam Energies  Lumbar Spine: Spine 3D 35/35 2.5 14/14 15X        02/29/2020 PET scan   1. Similar size and appearance of expansile lytic lesion in the L5 vertebral body and posterior elements with minimal FDG activity.  2. Otherwise, no additional osseous lucent lesions with FDG uptake above background bone marrow   05/31/2020 -  Chemotherapy   The patient had Revlimid for chemotherapy treatment.        REVIEW OF SYSTEMS:   Constitutional: Denies fevers, chills or abnormal weight loss Eyes: Denies blurriness of vision Ears, nose, mouth, throat, and face: Denies mucositis or sore throat Respiratory: Denies cough, dyspnea or wheezes Cardiovascular: Denies palpitation, chest discomfort or lower extremity swelling Gastrointestinal:  Denies nausea, heartburn or change in bowel habits Skin: Denies abnormal skin rashes Lymphatics: Denies new lymphadenopathy or easy bruising Neurological:Denies numbness, tingling or new weaknesses Behavioral/Psych: Mood is stable, no new changes  All other systems were reviewed with the patient and are negative.  I have reviewed the past medical history, past surgical history, social history and family history with the patient and they are unchanged from previous note.  ALLERGIES:  has No Known Allergies.  MEDICATIONS:  Current Outpatient Medications  Medication Sig Dispense Refill  . acetaminophen (TYLENOL) 500 MG tablet Take 500 mg by mouth every 6 (six) hours as needed.    Marland Kitchen acyclovir (ZOVIRAX) 400 MG tablet Take 1 tablet (400 mg total) by mouth 2 (two) times daily. 60 tablet 11  . aspirin 325 MG tablet Take 325 mg by mouth daily.    . calcium carbonate (TUMS - DOSED IN MG ELEMENTAL CALCIUM) 500 MG chewable tablet Chew 1 tablet by mouth 2 (two) times daily.    . cholecalciferol (VITAMIN D3) 25 MCG (1000 UT) tablet Take 2,000 Units by mouth daily.    . folic acid (FOLVITE) 1 MG tablet Take 1 mg by mouth daily.    Marland Kitchen HYDROcodone-acetaminophen (NORCO/VICODIN) 5-325 MG tablet Take 1 tablet by mouth every 6 (six) hours as needed for moderate pain.  30 tablet 0  . lenalidomide (REVLIMID) 15 MG capsule Take 1 capsule (15 mg total) by mouth daily. Take 1 capsule for 21 days, then stop for 7 days for a cycle of every 28 days 21 capsule 0  . lisinopril-hydrochlorothiazide (ZESTORETIC) 20-12.5 MG tablet TAKE 1 TABLET BY MOUTH DAILY 90 tablet 0  . Multiple Vitamin  (MULTI-VITAMIN) tablet Take by mouth.    . ondansetron (ZOFRAN) 8 MG tablet Take 1 tablet (8 mg total) by mouth 2 (two) times daily as needed (Nausea or vomiting). 30 tablet 1  . prochlorperazine (COMPAZINE) 10 MG tablet Take 1 tablet (10 mg total) by mouth every 6 (six) hours as needed (Nausea or vomiting). 30 tablet 1  . vitamin B-12 (CYANOCOBALAMIN) 1000 MCG tablet Take 1,000 mcg by mouth daily.     No current facility-administered medications for this visit.    PHYSICAL EXAMINATION: ECOG PERFORMANCE STATUS: 0 - Asymptomatic  Vitals:   11/18/20 1454  BP: (!) 167/97  Pulse: 91  Resp: 18  Temp: 98.7 F (37.1 C)  SpO2: 95%   Filed Weights   11/18/20 1454  Weight: 240 lb 12.8 oz (109.2 kg)    GENERAL:alert, no distress and comfortable NEURO: alert & oriented x 3 with fluent speech, no focal motor/sensory deficits  LABORATORY DATA:  I have reviewed the data as listed    Component Value Date/Time   NA 137 11/13/2020 1153   NA 139 10/11/2018 1115   NA 139 04/05/2014 0849   K 4.1 11/13/2020 1153   K 4.4 04/05/2014 0849   CL 104 11/13/2020 1153   CO2 25 11/13/2020 1153   CO2 23 04/05/2014 0849   GLUCOSE 79 11/13/2020 1153   GLUCOSE 109 04/05/2014 0849   BUN 23 11/13/2020 1153   BUN 23 10/11/2018 1115   BUN 16.8 04/05/2014 0849   CREATININE 0.83 11/13/2020 1153   CREATININE 0.91 07/29/2017 1004   CREATININE 0.9 04/05/2014 0849   CALCIUM 9.0 11/13/2020 1153   CALCIUM 8.7 04/05/2014 0849   PROT 7.0 11/13/2020 1153   PROT 7.2 10/11/2018 1115   PROT 6.7 04/05/2014 0849   ALBUMIN 3.9 11/13/2020 1153   ALBUMIN 4.7 10/11/2018 1115   ALBUMIN 3.8 04/05/2014 0849   AST 32 11/13/2020 1153   AST 26 04/05/2014 0849   ALT 42 11/13/2020 1153   ALT 30 04/05/2014 0849   ALKPHOS 51 11/13/2020 1153   ALKPHOS 75 04/05/2014 0849   BILITOT 1.1 11/13/2020 1153   BILITOT 0.71 04/05/2014 0849   GFRNONAA >60 11/13/2020 1153   GFRAA >60 06/14/2020 0830    No results found for: SPEP,  UPEP  Lab Results  Component Value Date   WBC 4.1 11/13/2020   NEUTROABS 1.2 (L) 11/13/2020   HGB 14.8 11/13/2020   HCT 42.6 11/13/2020   MCV 104.9 (H) 11/13/2020   PLT 200 11/13/2020      Chemistry      Component Value Date/Time   NA 137 11/13/2020 1153   NA 139 10/11/2018 1115   NA 139 04/05/2014 0849   K 4.1 11/13/2020 1153   K 4.4 04/05/2014 0849   CL 104 11/13/2020 1153   CO2 25 11/13/2020 1153   CO2 23 04/05/2014 0849   BUN 23 11/13/2020 1153   BUN 23 10/11/2018 1115   BUN 16.8 04/05/2014 0849   CREATININE 0.83 11/13/2020 1153   CREATININE 0.91 07/29/2017 1004   CREATININE 0.9 04/05/2014 0849   GLU 10 04/10/2014 0000      Component  Value Date/Time   CALCIUM 9.0 11/13/2020 1153   CALCIUM 8.7 04/05/2014 0849   ALKPHOS 51 11/13/2020 1153   ALKPHOS 75 04/05/2014 0849   AST 32 11/13/2020 1153   AST 26 04/05/2014 0849   ALT 42 11/13/2020 1153   ALT 30 04/05/2014 0849   BILITOT 1.1 11/13/2020 1153   BILITOT 0.71 04/05/2014 0849

## 2020-11-18 NOTE — Assessment & Plan Note (Signed)
We discussed the importance of weight loss and healthy lifestyle changes

## 2020-11-18 NOTE — Telephone Encounter (Signed)
Scheduled appts per 2/14 sch msg. Pt stated he would refer to mychart for AVS and appt info.

## 2020-11-21 ENCOUNTER — Ambulatory Visit: Payer: Medicare Other | Admitting: Hematology and Oncology

## 2020-11-25 DIAGNOSIS — M1712 Unilateral primary osteoarthritis, left knee: Secondary | ICD-10-CM | POA: Diagnosis not present

## 2020-11-28 DIAGNOSIS — Z9484 Stem cells transplant status: Secondary | ICD-10-CM | POA: Diagnosis not present

## 2020-11-28 DIAGNOSIS — Z23 Encounter for immunization: Secondary | ICD-10-CM | POA: Diagnosis not present

## 2020-11-28 DIAGNOSIS — C9000 Multiple myeloma not having achieved remission: Secondary | ICD-10-CM | POA: Diagnosis not present

## 2020-11-29 ENCOUNTER — Telehealth: Payer: Self-pay

## 2020-11-29 ENCOUNTER — Other Ambulatory Visit: Payer: Self-pay

## 2020-11-29 ENCOUNTER — Other Ambulatory Visit: Payer: Self-pay | Admitting: Hematology and Oncology

## 2020-11-29 DIAGNOSIS — C9000 Multiple myeloma not having achieved remission: Secondary | ICD-10-CM

## 2020-11-29 MED ORDER — LENALIDOMIDE 15 MG PO CAPS: 15.0000 mg | ORAL_CAPSULE | Freq: Every day | ORAL | 0 refills | Status: DC

## 2020-11-29 MED ORDER — HYDROCODONE-ACETAMINOPHEN 5-325 MG PO TABS: 1.0000 | ORAL_TABLET | Freq: Four times a day (QID) | ORAL | 0 refills | Status: DC | PRN

## 2020-11-29 NOTE — Telephone Encounter (Signed)
Called and given below message. She verbalized understanding. 

## 2020-11-29 NOTE — Telephone Encounter (Signed)
He called and ask for Revlimid refill. He is planning for his trip on 3/7. Revlimid Rx sent to pharmacy.  He is asking for Hydrocodone refill to have for his trip. He uses [facility].

## 2020-11-29 NOTE — Telephone Encounter (Signed)
done

## 2020-12-16 ENCOUNTER — Other Ambulatory Visit: Payer: Self-pay | Admitting: Family Medicine

## 2020-12-16 DIAGNOSIS — T464X5A Adverse effect of angiotensin-converting-enzyme inhibitors, initial encounter: Secondary | ICD-10-CM

## 2020-12-16 DIAGNOSIS — R058 Other specified cough: Secondary | ICD-10-CM

## 2020-12-16 MED ORDER — TELMISARTAN-HCTZ 40-12.5 MG PO TABS: 1.0000 | ORAL_TABLET | Freq: Every day | ORAL | 0 refills | Status: DC

## 2020-12-20 ENCOUNTER — Telehealth: Payer: Self-pay

## 2020-12-20 ENCOUNTER — Other Ambulatory Visit: Payer: Self-pay | Admitting: Family Medicine

## 2020-12-20 MED ORDER — LOSARTAN POTASSIUM-HCTZ 100-12.5 MG PO TABS: 1.0000 | ORAL_TABLET | Freq: Every day | ORAL | 3 refills | Status: DC

## 2020-12-20 NOTE — Telephone Encounter (Signed)
Recv'd fax from [place] that Telmisartan HCTz no longer covered by insurance.  Preferred alternatives are Losartan/Hctz, Irbesartan/Hctz, or Valsartan/Hctz, so you want to switch?

## 2020-12-20 NOTE — Telephone Encounter (Signed)
Pt switched to Losartan/Hctz

## 2021-01-02 ENCOUNTER — Other Ambulatory Visit: Payer: Self-pay

## 2021-01-02 DIAGNOSIS — C9000 Multiple myeloma not having achieved remission: Secondary | ICD-10-CM

## 2021-01-02 MED ORDER — LENALIDOMIDE 15 MG PO CAPS: 15.0000 mg | ORAL_CAPSULE | Freq: Every day | ORAL | 0 refills | Status: DC

## 2021-01-07 ENCOUNTER — Other Ambulatory Visit: Payer: Self-pay

## 2021-01-07 ENCOUNTER — Inpatient Hospital Stay: Payer: Medicare Other | Attending: Hematology and Oncology

## 2021-01-07 ENCOUNTER — Inpatient Hospital Stay: Payer: Medicare Other

## 2021-01-07 ENCOUNTER — Encounter: Payer: Self-pay | Admitting: Hematology and Oncology

## 2021-01-07 ENCOUNTER — Inpatient Hospital Stay (HOSPITAL_BASED_OUTPATIENT_CLINIC_OR_DEPARTMENT_OTHER): Payer: Medicare Other | Admitting: Hematology and Oncology

## 2021-01-07 ENCOUNTER — Other Ambulatory Visit: Payer: Self-pay | Admitting: Hematology and Oncology

## 2021-01-07 DIAGNOSIS — C9000 Multiple myeloma not having achieved remission: Secondary | ICD-10-CM

## 2021-01-07 DIAGNOSIS — D701 Agranulocytosis secondary to cancer chemotherapy: Secondary | ICD-10-CM | POA: Diagnosis not present

## 2021-01-07 DIAGNOSIS — D61818 Other pancytopenia: Secondary | ICD-10-CM

## 2021-01-07 DIAGNOSIS — I700 Atherosclerosis of aorta: Secondary | ICD-10-CM | POA: Diagnosis not present

## 2021-01-07 DIAGNOSIS — Z79899 Other long term (current) drug therapy: Secondary | ICD-10-CM | POA: Insufficient documentation

## 2021-01-07 DIAGNOSIS — E669 Obesity, unspecified: Secondary | ICD-10-CM

## 2021-01-07 DIAGNOSIS — D539 Nutritional anemia, unspecified: Secondary | ICD-10-CM

## 2021-01-07 DIAGNOSIS — T451X5A Adverse effect of antineoplastic and immunosuppressive drugs, initial encounter: Secondary | ICD-10-CM

## 2021-01-07 DIAGNOSIS — Z7982 Long term (current) use of aspirin: Secondary | ICD-10-CM | POA: Insufficient documentation

## 2021-01-07 LAB — CBC WITH DIFFERENTIAL (CANCER CENTER ONLY)
Abs Immature Granulocytes: 0.01 10*3/uL (ref 0.00–0.07)
Basophils Absolute: 0.1 10*3/uL (ref 0.0–0.1)
Basophils Relative: 2 %
Eosinophils Absolute: 0.1 10*3/uL (ref 0.0–0.5)
Eosinophils Relative: 3 %
HCT: 40.9 % (ref 39.0–52.0)
Hemoglobin: 14.6 g/dL (ref 13.0–17.0)
Immature Granulocytes: 0 %
Lymphocytes Relative: 45 %
Lymphs Abs: 1.3 10*3/uL (ref 0.7–4.0)
MCH: 37.1 pg — ABNORMAL HIGH (ref 26.0–34.0)
MCHC: 35.7 g/dL (ref 30.0–36.0)
MCV: 103.8 fL — ABNORMAL HIGH (ref 80.0–100.0)
Monocytes Absolute: 0.5 10*3/uL (ref 0.1–1.0)
Monocytes Relative: 16 %
Neutro Abs: 1 10*3/uL — ABNORMAL LOW (ref 1.7–7.7)
Neutrophils Relative %: 34 %
Platelet Count: 161 10*3/uL (ref 150–400)
RBC: 3.94 MIL/uL — ABNORMAL LOW (ref 4.22–5.81)
RDW: 13.4 % (ref 11.5–15.5)
WBC Count: 2.9 10*3/uL — ABNORMAL LOW (ref 4.0–10.5)
nRBC: 0 % (ref 0.0–0.2)

## 2021-01-07 LAB — CMP (CANCER CENTER ONLY)
ALT: 37 U/L (ref 0–44)
AST: 24 U/L (ref 15–41)
Albumin: 3.8 g/dL (ref 3.5–5.0)
Alkaline Phosphatase: 58 U/L (ref 38–126)
Anion gap: 11 (ref 5–15)
BUN: 17 mg/dL (ref 8–23)
CO2: 26 mmol/L (ref 22–32)
Calcium: 8.7 mg/dL — ABNORMAL LOW (ref 8.9–10.3)
Chloride: 102 mmol/L (ref 98–111)
Creatinine: 0.86 mg/dL (ref 0.61–1.24)
GFR, Estimated: >60 mL/min (ref >60)
Glucose, Bld: 126 mg/dL — ABNORMAL HIGH (ref 70–99)
Potassium: 3.7 mmol/L (ref 3.5–5.1)
Sodium: 139 mmol/L (ref 135–145)
Total Bilirubin: 1.1 mg/dL (ref 0.3–1.2)
Total Protein: 6.7 g/dL (ref 6.5–8.1)

## 2021-01-07 LAB — VITAMIN B12: Vitamin B-12: 731 pg/mL (ref 180–914)

## 2021-01-07 MED ORDER — ACYCLOVIR 400 MG PO TABS: 400.0000 mg | ORAL_TABLET | Freq: Two times a day (BID) | ORAL | 11 refills | Status: DC

## 2021-01-07 NOTE — Assessment & Plan Note (Signed)
The patient is motivated to lose weight I recommend we set a goal He appears motivated to get a goal weight at 233 pounds in 2 months

## 2021-01-07 NOTE — Assessment & Plan Note (Signed)
His myeloma panel is pending I will call him with test results next week He tolerated 15 mg dose well without major pancytopenia We discussed the risk and benefits of continuing the current dose long-term and he is in agreement He will continue acyclovir for antimicrobial prophylaxis along with aspirin for DVT prophylaxis He will continue calcium with vitamin D I will schedule next dose of Xgeva in 2 months instead of today due to hypocalcemia and recent bone pain

## 2021-01-07 NOTE — Assessment & Plan Note (Signed)
This is due to Revlimid He is not symptomatic Observe for now We discussed neutropenic precaution especially when he travels

## 2021-01-07 NOTE — Assessment & Plan Note (Signed)
This is due to recent [place] He will continue vitamin D and calcium

## 2021-01-07 NOTE — Progress Notes (Signed)
[facility] OFFICE PROGRESS NOTE  Patient Care Team: Denita Lung, MD as PCP - General (Family Medicine) Heath Lark, MD as Consulting Physician (Hematology and Oncology)  ASSESSMENT & PLAN:  Multiple myeloma without remission [facility]) His myeloma panel is pending I will call him with test results next week He tolerated 15 mg dose well without major pancytopenia We discussed the risk and benefits of continuing the current dose long-term and he is in agreement He will continue acyclovir for antimicrobial prophylaxis along with aspirin for DVT prophylaxis He will continue calcium with vitamin D I will schedule next dose of Xgeva in 2 months instead of today due to hypocalcemia and recent bone pain  Leukopenia due to antineoplastic chemotherapy [facility]) This is due to Revlimid He is not symptomatic Observe for now We discussed neutropenic precaution especially when he travels  Hypocalcemia This is due to recent Xgeva He will continue vitamin D and calcium  Obesity (BMI 30-39.9) The patient is motivated to lose weight I recommend we set a goal He appears motivated to get a goal weight at 233 pounds in 2 months   No orders of the defined types were placed in this encounter.   All questions were answered. The patient knows to call the clinic with any problems, questions or concerns. The total time spent in the appointment was 20 minutes encounter with patients including review of chart and various tests results, discussions about plan of care and coordination of care plan   Heath Lark, MD 01/07/2021 11:54 AM  INTERVAL HISTORY: Please see below for problem oriented charting. He returns for further follow-up He enjoyed his trip to [place] last month He is able to lose some weight due to increased activity He had recent flare of back pain but that has resolved No recent dental issues No recent infection, fever or chills  SUMMARY OF ONCOLOGIC HISTORY: Oncology  History  Multiple myeloma without remission ([place])  03/20/2019 Imaging   1. Tumor involving the L5 and S1 and S2 segments of the spine as described with mass effects upon the left L4, L5, S1 and more distal left sacral nerves as described above. 2. Does the patient have a history of malignancy? 3. Multiple plasmacytomas and metastatic disease could give this appearance   03/27/2019 Pathology Results   Soft Tissue Needle Core Biopsy, sacral mass - PLASMABLASTIC NEOPLASM. - SEE COMMENT. Microscopic Comment The sections show needle core biopsy fragments of soft tissue densely infiltrated by a relatively monomorphic infiltrate of atypical plasmacytoid cells characterized by vesicular or partially clumped chromatin and prominent nucleoli. This is associated with scattered mitosis. A battery of immunohistochemical stains was performed and show that the atypical plasmacytoid cells are positive for CD138, CD43 and cytoplasmic kappa. There is also weak positivity for CD56 and partial variable positivity for cyclin D1. The atypical plasmacytoid cells are negative for cytoplasmic lambda, CD10, PAX5, CD79a, CD20, CD3, CD5, CD34, EBV (ISH) and mostly negative for LCA. The overall morphologic and histologic features are most compatible with a plasmablastic neoplasm. The differential diagnosis includes plasmablastic lymphoma and plasmablastic plasmacytoma/myeloma. Based on the overall phenotypic features and the clinical setting, plasmacytoma/myeloma is favored. Clinical correlation and hematologic evaluation is recommended   03/27/2019 Procedure   Technically successful CT-guided biopsy of sacral soft tissue mass.   04/04/2019 Initial Diagnosis   Multiple myeloma without remission ([place])   04/11/2019 PET scan   IMPRESSION: 1. Previously noted expansile lesions involving the L5 vertebra and sacrum are again noted and  exhibit intense FDG uptake compatible with metabolically active disease. A third focus of increased  uptake without corresponding CT abnormality is noted within the intertrochanteric portions of the proximal right femur. 2. Small lucent lesion within the T3 vertebra is noted without corresponding FDG uptake. 3. Asymmetric left bladder wall thickening, etiology indeterminate. 4. Aortic Atherosclerosis (ICD10-I70.0). Lad coronary artery calcification.   04/12/2019 Cancer Staging   Staging form: Plasma Cell Myeloma and Plasma Cell Disorders, AJCC 8th Edition - Clinical stage from 04/12/2019: Beta-2-microglobulin (mg/L): 2, Albumin (g/dL): 3.6, ISS: Stage I, High-risk cytogenetics: Unknown, LDH: Unknown - Signed by Heath Lark, MD on 04/12/2019   04/14/2019 Bone Marrow Biopsy   Bone Marrow, Aspirate,Biopsy, and Clot, right iliac bone - PLASMA CELL MYELOMA.   04/17/2019 - 07/11/2019 Chemotherapy   The patient had bortezemib, revlimid and dexamethasone for chemotherapy treatment.     08/17/2019 Bone Marrow Transplant   He received high dose melphalan followed by autologous stem cell transplant at [facility]   11/22/2019 Bone Marrow Biopsy   BONE MARROW biopsy at [facility]:       Mild monoclonal plasmacytosis (approximately 2%), kappa light chain restricted.    11/22/2019 PET scan   PET CT at [facility] 1.  Similar size and appearance of mildly hypermetabolic lytic lesion in the L5 vertebral body and posterior elements.  2.  Otherwise, no substantial FDG uptake compared to background bone marrow in the additional osseous lucent lesions.    12/20/2019 - 05/09/2020 Radiation Therapy   Radiation Treatment Dates: 12/20/2019 through 01/08/2020 Site Technique Total Dose (Gy) Dose per Fx (Gy) Completed Fx Beam Energies  Lumbar Spine: Spine 3D 35/35 2.5 14/14 15X        02/29/2020 PET scan   1. Similar size and appearance of expansile lytic lesion in the L5 vertebral body and posterior elements with minimal FDG activity.  2. Otherwise, no additional osseous lucent lesions with FDG uptake above background  bone marrow   05/31/2020 -  Chemotherapy   The patient had Revlimid for chemotherapy treatment.       REVIEW OF SYSTEMS:   Constitutional: Denies fevers, chills or abnormal weight loss Eyes: Denies blurriness of vision Ears, nose, mouth, throat, and face: Denies mucositis or sore throat Respiratory: Denies cough, dyspnea or wheezes Cardiovascular: Denies palpitation, chest discomfort or lower extremity swelling Gastrointestinal:  Denies nausea, heartburn or change in bowel habits Skin: Denies abnormal skin rashes Lymphatics: Denies new lymphadenopathy or easy bruising Neurological:Denies numbness, tingling or new weaknesses Behavioral/Psych: Mood is stable, no new changes  All other systems were reviewed with the patient and are negative.  I have reviewed the past medical history, past surgical history, social history and family history with the patient and they are unchanged from previous note.  ALLERGIES:  has No Known Allergies.  MEDICATIONS:  Current Outpatient Medications  Medication Sig Dispense Refill  . acetaminophen (TYLENOL) 500 MG tablet Take 500 mg by mouth every 6 (six) hours as needed.    Marland Kitchen acyclovir (ZOVIRAX) 400 MG tablet Take 1 tablet (400 mg total) by mouth 2 (two) times daily. 180 tablet 11  . aspirin 325 MG tablet Take 325 mg by mouth daily.    . calcium carbonate (TUMS - DOSED IN MG ELEMENTAL CALCIUM) 500 MG chewable tablet Chew 1 tablet by mouth 2 (two) times daily.    . cholecalciferol (VITAMIN D3) 25 MCG (1000 UT) tablet Take 2,000 Units by mouth daily.    . folic acid (FOLVITE) 1 MG tablet  Take 1 mg by mouth daily.    Marland Kitchen HYDROcodone-acetaminophen (NORCO/VICODIN) 5-325 MG tablet Take 1 tablet by mouth every 6 (six) hours as needed for moderate pain. 30 tablet 0  . lenalidomide (REVLIMID) 15 MG capsule Take 1 capsule (15 mg total) by mouth daily. Take 1 capsule for 21 days, then stop for 7 days for a cycle of every 28 days 21 capsule 0  .  losartan-hydrochlorothiazide (HYZAAR) 100-12.5 MG tablet Take 1 tablet by mouth daily. 90 tablet 3  . Multiple Vitamin (MULTI-VITAMIN) tablet Take by mouth.    . ondansetron (ZOFRAN) 8 MG tablet Take 1 tablet (8 mg total) by mouth 2 (two) times daily as needed (Nausea or vomiting). 30 tablet 1  . prochlorperazine (COMPAZINE) 10 MG tablet Take 1 tablet (10 mg total) by mouth every 6 (six) hours as needed (Nausea or vomiting). 30 tablet 1  . vitamin B-12 (CYANOCOBALAMIN) 1000 MCG tablet Take 1,000 mcg by mouth daily.     No current facility-administered medications for this visit.    PHYSICAL EXAMINATION: ECOG PERFORMANCE STATUS: 1 - Symptomatic but completely ambulatory  Vitals:   01/07/21 0919  BP: (!) 152/82  Pulse: 94  Resp: 19  Temp: 98.7 F (37.1 C)  SpO2: 97%   Filed Weights   01/07/21 0919  Weight: 236 lb 12.8 oz (107.4 kg)    GENERAL:alert, no distress and comfortable NEURO: alert & oriented x 3 with fluent speech, no focal motor/sensory deficits  LABORATORY DATA:  I have reviewed the data as listed    Component Value Date/Time   NA 139 01/07/2021 0859   NA 139 10/11/2018 1115   NA 139 04/05/2014 0849   K 3.7 01/07/2021 0859   K 4.4 04/05/2014 0849   CL 102 01/07/2021 0859   CO2 26 01/07/2021 0859   CO2 23 04/05/2014 0849   GLUCOSE 126 (H) 01/07/2021 0859   GLUCOSE 109 04/05/2014 0849   BUN 17 01/07/2021 0859   BUN 23 10/11/2018 1115   BUN 16.8 04/05/2014 0849   CREATININE 0.86 01/07/2021 0859   CREATININE 0.91 07/29/2017 1004   CREATININE 0.9 04/05/2014 0849   CALCIUM 8.7 (L) 01/07/2021 0859   CALCIUM 8.7 04/05/2014 0849   PROT 6.7 01/07/2021 0859   PROT 7.2 10/11/2018 1115   PROT 6.7 04/05/2014 0849   ALBUMIN 3.8 01/07/2021 0859   ALBUMIN 4.7 10/11/2018 1115   ALBUMIN 3.8 04/05/2014 0849   AST 24 01/07/2021 0859   AST 26 04/05/2014 0849   ALT 37 01/07/2021 0859   ALT 30 04/05/2014 0849   ALKPHOS 58 01/07/2021 0859   ALKPHOS 75 04/05/2014 0849    BILITOT 1.1 01/07/2021 0859   BILITOT 0.71 04/05/2014 0849   GFRNONAA >60 01/07/2021 0859   GFRAA >60 06/14/2020 0830    No results found for: SPEP, UPEP  Lab Results  Component Value Date   WBC 2.9 (L) 01/07/2021   NEUTROABS 1.0 (L) 01/07/2021   HGB 14.6 01/07/2021   HCT 40.9 01/07/2021   MCV 103.8 (H) 01/07/2021   PLT 161 01/07/2021      Chemistry      Component Value Date/Time   NA 139 01/07/2021 0859   NA 139 10/11/2018 1115   NA 139 04/05/2014 0849   K 3.7 01/07/2021 0859   K 4.4 04/05/2014 0849   CL 102 01/07/2021 0859   CO2 26 01/07/2021 0859   CO2 23 04/05/2014 0849   BUN 17 01/07/2021 0859   BUN 23 10/11/2018 1115  BUN 16.8 04/05/2014 0849   CREATININE 0.86 01/07/2021 0859   CREATININE 0.91 07/29/2017 1004   CREATININE 0.9 04/05/2014 0849   GLU 10 04/10/2014 0000      Component Value Date/Time   CALCIUM 8.7 (L) 01/07/2021 0859   CALCIUM 8.7 04/05/2014 0849   ALKPHOS 58 01/07/2021 0859   ALKPHOS 75 04/05/2014 0849   AST 24 01/07/2021 0859   AST 26 04/05/2014 0849   ALT 37 01/07/2021 0859   ALT 30 04/05/2014 0849   BILITOT 1.1 01/07/2021 0859   BILITOT 0.71 04/05/2014 0849

## 2021-01-08 LAB — KAPPA/LAMBDA LIGHT CHAINS
Kappa free light chain: 27.8 mg/L — ABNORMAL HIGH (ref 3.3–19.4)
Kappa, lambda light chain ratio: 1.43 (ref 0.26–1.65)
Lambda free light chains: 19.4 mg/L (ref 5.7–26.3)

## 2021-01-09 ENCOUNTER — Telehealth: Payer: Self-pay

## 2021-01-09 LAB — MULTIPLE MYELOMA PANEL, SERUM
Albumin SerPl Elph-Mcnc: 3.6 g/dL (ref 2.9–4.4)
Albumin/Glob SerPl: 1.3 (ref 0.7–1.7)
Alpha 1: 0.2 g/dL (ref 0.0–0.4)
Alpha2 Glob SerPl Elph-Mcnc: 0.7 g/dL (ref 0.4–1.0)
B-Globulin SerPl Elph-Mcnc: 0.9 g/dL (ref 0.7–1.3)
Gamma Glob SerPl Elph-Mcnc: 0.9 g/dL (ref 0.4–1.8)
Globulin, Total: 2.8 g/dL (ref 2.2–3.9)
IgA: 96 mg/dL (ref 61–437)
IgG (Immunoglobin G), Serum: 945 mg/dL (ref 603–1613)
IgM (Immunoglobulin M), Srm: 23 mg/dL (ref 20–172)
M Protein SerPl Elph-Mcnc: 0.3 g/dL — ABNORMAL HIGH
Total Protein ELP: 6.4 g/dL (ref 6.0–8.5)

## 2021-01-09 NOTE — Telephone Encounter (Signed)
Called and given below message. He verbalized understanding.  Complaining of summer time cold. Ask if it is okay to Coricidin for congestion. Told him it is okay to take and to call the office back if needed. He verbalized understanding.

## 2021-01-09 NOTE — Telephone Encounter (Signed)
-----   Message from Heath Lark, MD sent at 01/09/2021  9:51 AM EDT ----- Let him know myeloma panel is stable

## 2021-01-23 ENCOUNTER — Telehealth: Payer: Self-pay

## 2021-01-23 NOTE — Telephone Encounter (Signed)
He called and left a message asking when he should get the covid vaccine booster.  Called back and left a message to wait until the fall during the flu season when the numbers start gong up again to get the booster vaccine.

## 2021-01-28 ENCOUNTER — Other Ambulatory Visit: Payer: Self-pay

## 2021-01-28 DIAGNOSIS — C9000 Multiple myeloma not having achieved remission: Secondary | ICD-10-CM

## 2021-01-28 MED ORDER — LENALIDOMIDE 15 MG PO CAPS: 15.0000 mg | ORAL_CAPSULE | Freq: Every day | ORAL | 0 refills | Status: DC

## 2021-02-05 IMAGING — MR MR LUMBAR SPINE WO/W CM
7 of 9 series · 23 of 48 positions shown · IV contrast (with contrast)
Comparison: 03/20/2019

CLINICAL DATA: Hematologic malignancy staging. Multiple myeloma
with lower back pain

EXAM:
MRI LUMBAR SPINE WITHOUT AND WITH CONTRAST
TECHNIQUE: Multiplanar and multiecho pulse sequences of the lumbar spine were
obtained without and with intravenous contrast.
CONTRAST:  10mL GADAVIST GADOBUTROL 1 MMOL/ML IV SOLN

[Series 9: T1 · sagittal · 4.0mm · 0.84mm/px · 2 of 17 slices shown (1 of 2)]
[im 1/17]
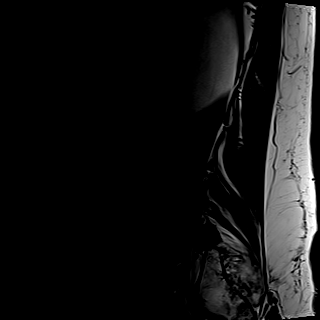
[im 17/17]
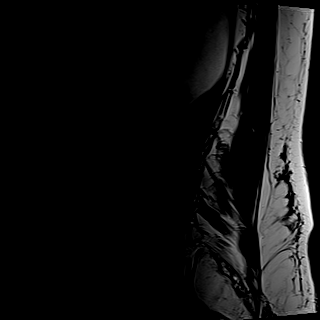

[Series 10: STIR · sagittal · 4.0mm · 0.53mm/px · 2 of 17 slices shown]
[im 1/17]
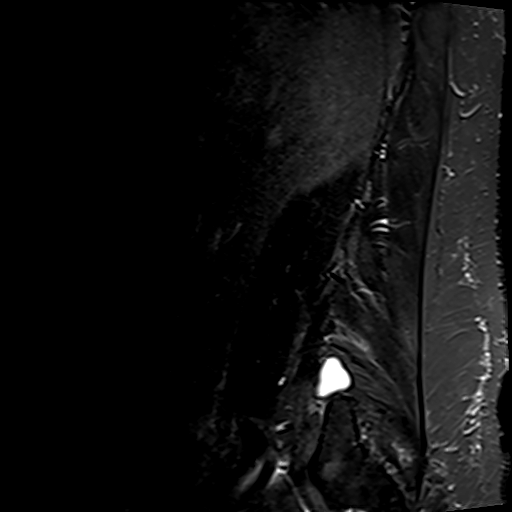
[im 17/17]
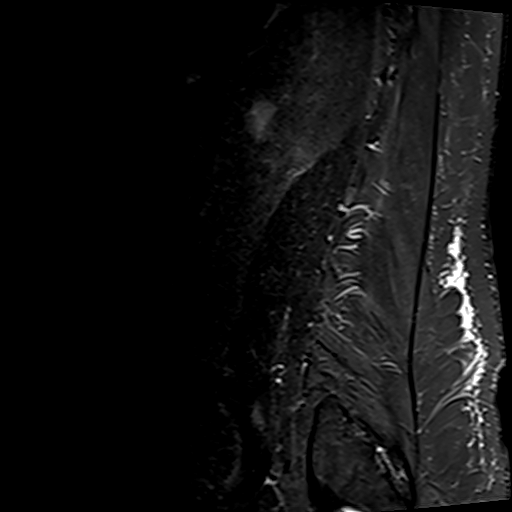

[Series 11: T2 · axial · 4.0mm · 0.78mm/px · z∈[-63,+182]mm · 5 of 50 slices shown]
[im 1/50]
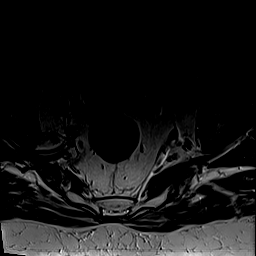
[im 13/50]
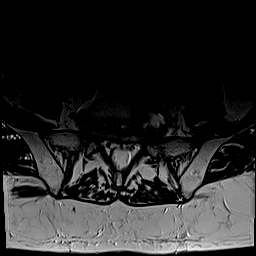
[im 25/50]
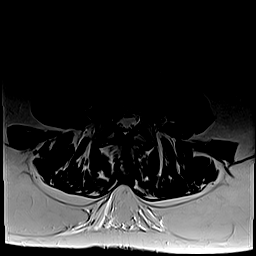
[im 37/50]
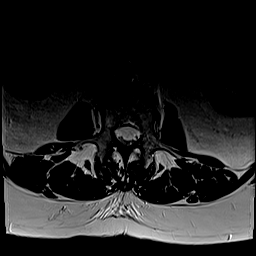
[im 50/50]
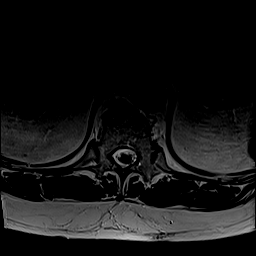

[Series 12: T1 · axial · 4.0mm · 0.39mm/px · z∈[-63,+182]mm · 5 of 50 slices shown (2 of 2)]
[im 1/50]
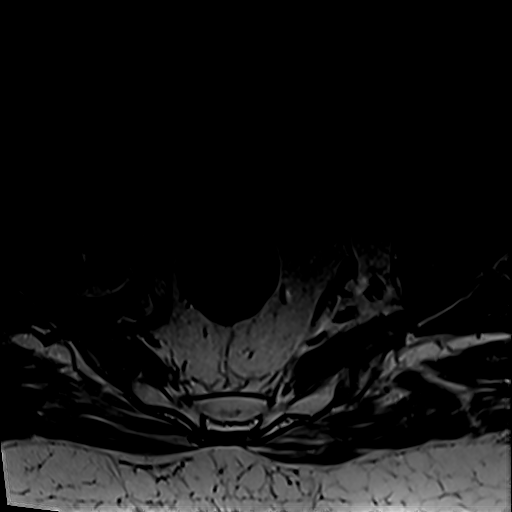
[im 13/50]
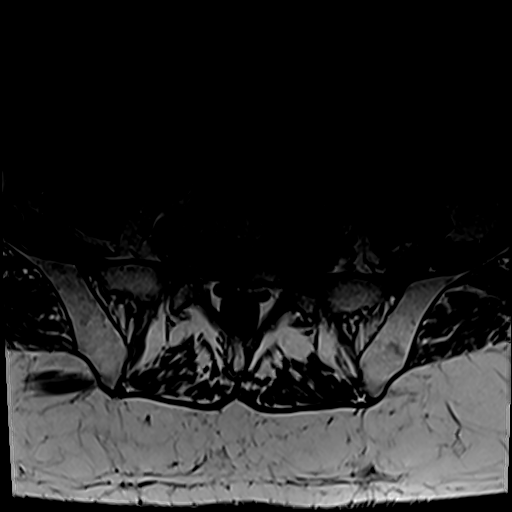
[im 25/50]
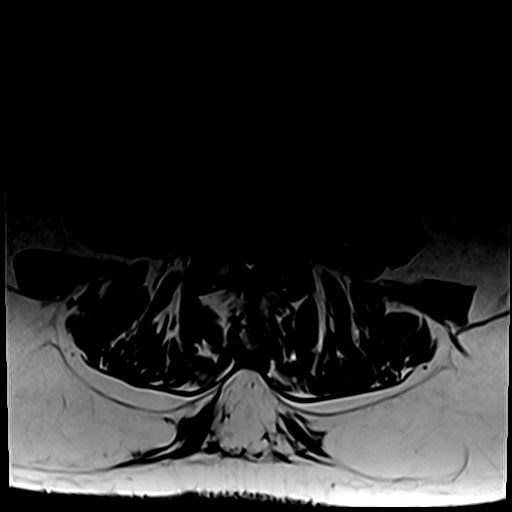
[im 37/50]
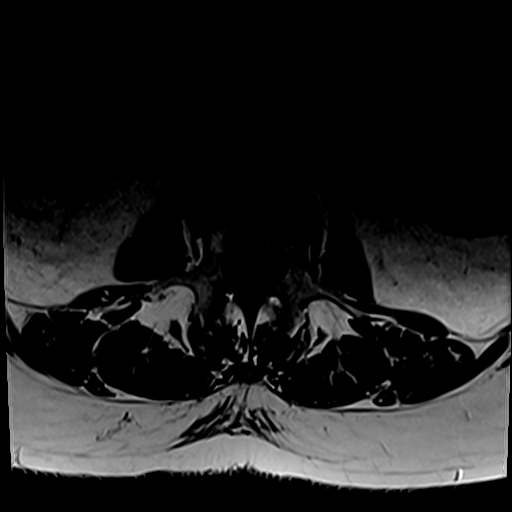
[im 50/50]
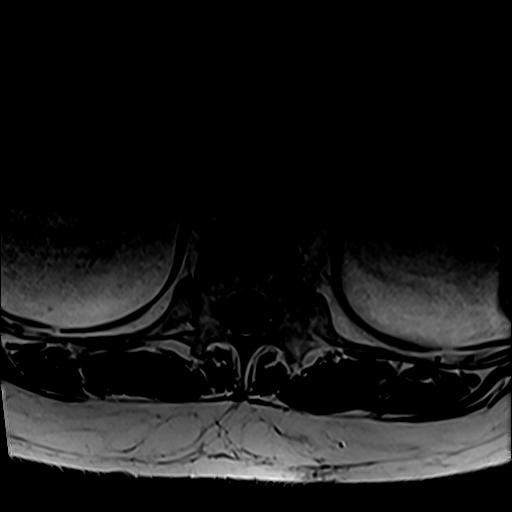

[Series 17: T2 post-contrast · sagittal · 4.0mm · 0.84mm/px · 2 of 17 slices shown]
[im 1/17]
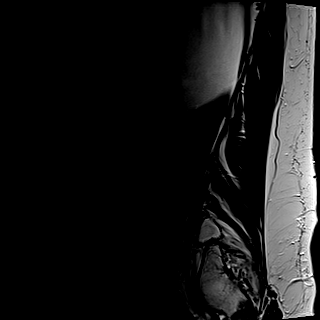
[im 17/17]
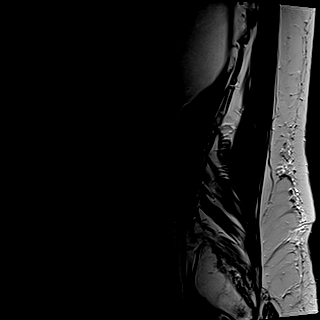

[Series 18: T1 fat-sat post-contrast · sagittal · 4.0mm · 0.84mm/px · 2 of 17 slices shown]
[im 1/17]
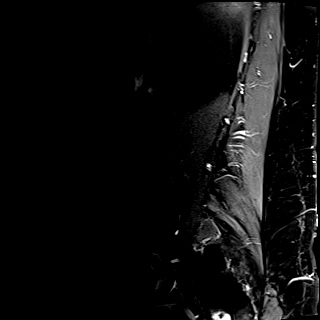
[im 17/17]
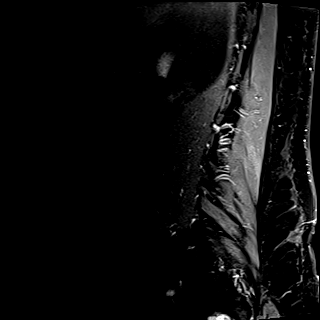

[Series 19: T1 post-contrast · axial · 4.0mm · 0.43mm/px · z∈[-71,+174]mm · 5 of 50 slices shown]
[im 1/50]
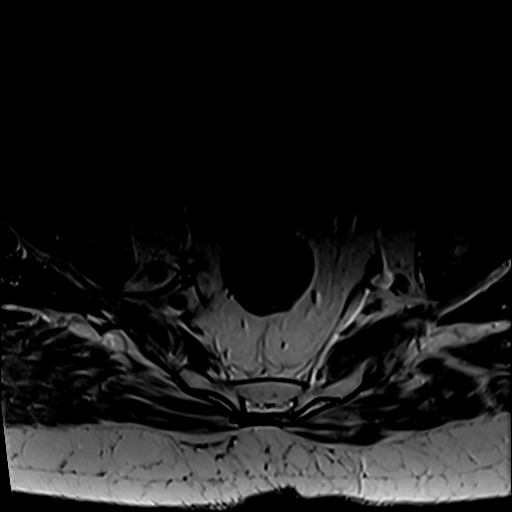
[im 13/50]
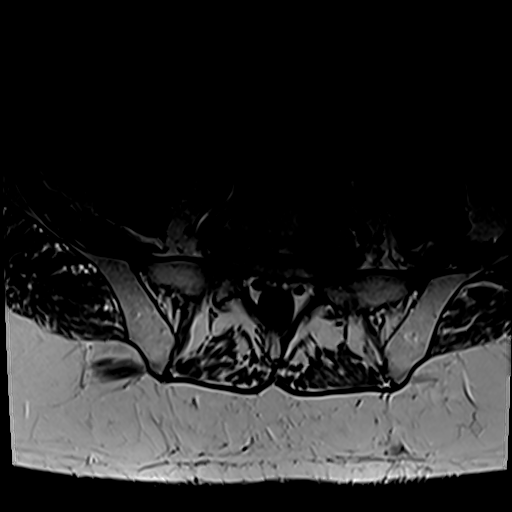
[im 25/50]
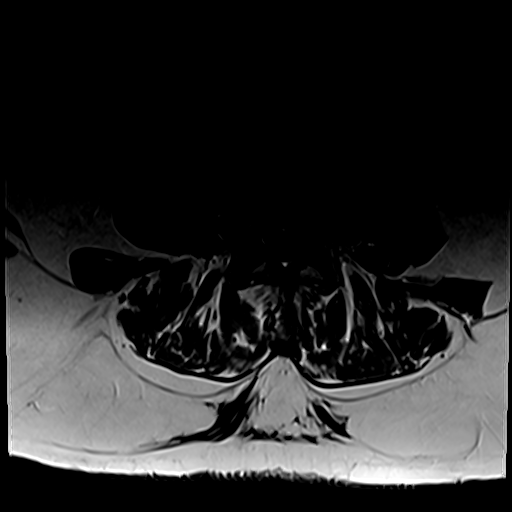
[im 37/50]
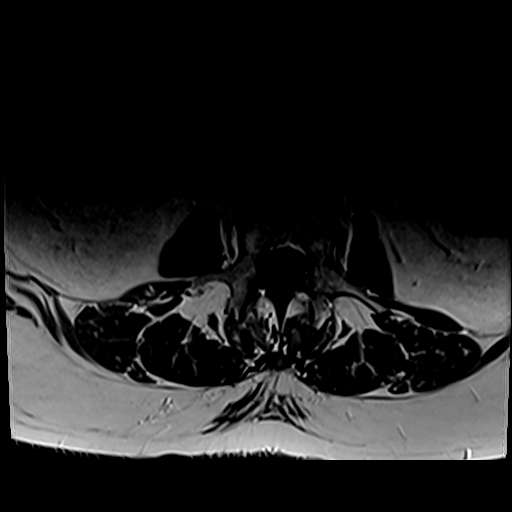
[im 50/50]
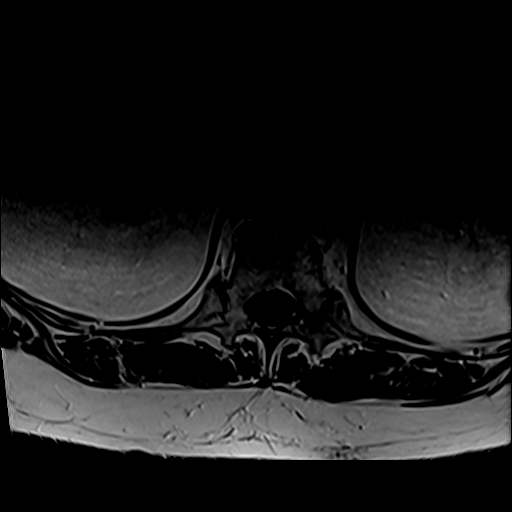

[23 of 48 positions shown; findings below may reference images not displayed]

FINDINGS: Segmentation:  5 lumbar type vertebrae

Alignment:  Normal

Vertebrae: Bone lesions in the L5 left body and posterior elements
and in the S2 left body and posterior elements has partially
contracted and has become cystic appearing with peripheral
enhancement and prominent central T2 signal. There is accentuated
fatty marrow at L4 and below, attributed to radiotherapy. No active
bone lesion is seen. No pathologic fracture or soft tissue tumor.

Conus medullaris and cauda equina: Conus extends to the L1 level.
Conus and cauda equina appear normal.

Paraspinal and other soft tissues: Trabeculated bladder, incidental.

Disc levels:

T12- L1: Spondylosis with bridging osteophyte.

L1-L2: Spondylosis with bridging osteophyte. Tiny right paracentral
protrusion and annular fissure.

L2-L3: Disc narrowing and bulging with right eccentric ridging. Mild
facet spurring. No neural compression.

L3-L4: Disc narrowing and bulging with right paracentral inferiorly
pointing protrusion which could affect the right L4 nerve root. Mild
bilateral foraminal narrowing

L4-L5: Disc narrowing and bulging with degenerative facet spurring.
The canal and foramina are patent

L5-S1:Disc narrowing with intervertebral ankylosis. Ridging of the
endplates causes left foraminal impingement that is improved due to
regression of tumor.
IMPRESSION: 1. Satisfactorytreated appearance of L5 and sacral lesions which
have become cystic and have regressed in size since 1616. No
evidence of new or active lesion.
2. L5-S1 left foraminal impingement which is improved due to lesion
regression since [DATE]. L3-4 small right paracentral protrusion which could affect the
right L4 nerve root

## 2021-02-14 DIAGNOSIS — M1712 Unilateral primary osteoarthritis, left knee: Secondary | ICD-10-CM | POA: Diagnosis not present

## 2021-02-25 ENCOUNTER — Other Ambulatory Visit: Payer: Self-pay

## 2021-02-25 ENCOUNTER — Other Ambulatory Visit: Payer: Self-pay | Admitting: Family Medicine

## 2021-02-25 DIAGNOSIS — C9000 Multiple myeloma not having achieved remission: Secondary | ICD-10-CM

## 2021-02-25 MED ORDER — LENALIDOMIDE 15 MG PO CAPS: 15.0000 mg | ORAL_CAPSULE | Freq: Every day | ORAL | 0 refills | Status: DC

## 2021-03-10 ENCOUNTER — Inpatient Hospital Stay: Payer: Medicare Other | Admitting: Hematology and Oncology

## 2021-03-10 ENCOUNTER — Telehealth: Payer: Self-pay

## 2021-03-10 ENCOUNTER — Inpatient Hospital Stay: Payer: Medicare Other

## 2021-03-10 NOTE — Telephone Encounter (Signed)
He called and left a message to call him.   Called back. He wants to cancel today's appts due to a cold. Appts canceled. His Revlimid was changed to generic with last refill. As soon as he started it he started having a cough. Now he has a miserable cold. Home covid test negative when checked last week. Denies fever. He is taking Coricidin D.

## 2021-03-10 NOTE — Telephone Encounter (Signed)
He called and left a message asking if it is okay to take benadryl with Blood pressure medication. Called back and per Dr. Alvy Bimler okay to take benadryl. Instructed to check blood pressure and write down readings. Bring readings to next appt. He verbalized understanding.

## 2021-03-10 NOTE — Telephone Encounter (Signed)
Above message given to Dr. Alvy Bimler.  Called back. appts rescheduled to 6/27, he is aware of appt times. Per Dr. Alvy Bimler, generic Revlimid would not give you a cold. Try OTC allergy medications to see if it is allergies. He is going to repeat the covid test and will call the office back if it is positive.

## 2021-03-11 ENCOUNTER — Telehealth: Payer: Self-pay

## 2021-03-11 NOTE — Telephone Encounter (Signed)
He called and left a message. He and his partner did another covid test at home and it is negative.

## 2021-03-17 ENCOUNTER — Telehealth (INDEPENDENT_AMBULATORY_CARE_PROVIDER_SITE_OTHER): Payer: Medicare Other | Admitting: Family Medicine

## 2021-03-17 ENCOUNTER — Encounter: Payer: Self-pay | Admitting: Family Medicine

## 2021-03-17 VITALS — Ht 68.0 in | Wt 231.0 lb

## 2021-03-17 DIAGNOSIS — G8929 Other chronic pain: Secondary | ICD-10-CM | POA: Diagnosis not present

## 2021-03-17 DIAGNOSIS — C9000 Multiple myeloma not having achieved remission: Secondary | ICD-10-CM

## 2021-03-17 DIAGNOSIS — M25562 Pain in left knee: Secondary | ICD-10-CM | POA: Diagnosis not present

## 2021-03-17 DIAGNOSIS — R059 Cough, unspecified: Secondary | ICD-10-CM | POA: Diagnosis not present

## 2021-03-17 NOTE — Progress Notes (Signed)
   Subjective:    Patient ID: Jesse Mcclure., male    DOB: [birth year], 70 y.o.   MRN: [id number]  HPI Documentation for virtual audio and video telecommunications through [facility] encounter: The patient was located at home. 2 patient identifiers used.  The provider was located in the office. The patient did consent to this visit and is aware of possible charges through their insurance for this visit. The other persons participating in this telemedicine service were none. Time spent on call was 10 minutes and in review of previous records >20 minutes total for counseling and coordination of care. This virtual service is not related to other E/M service within previous 7 days.  He is here for consult concerning continued difficulty with coughing.  He states that he had more difficulty with cough after he came back from [place].  He was switched to a different generic product called Revlimid and thinks it might be related to that.  His oncologist had him take Benadryl to help with what she considered more allergy related then from medication.  He has had no fever, chills, sore throat, shortness of breath.  He now states that he is roughly 80 to 90% better.  Before he went to [place] he did have an injection into his knee which is he states helped greatly for the pain that he was having there.  He says he rarely takes a pain med.  Review of Systems     Objective:   Physical Exam Alert and in no distress otherwise not examined       Assessment & Plan:  Cough  Multiple myeloma without remission ([facility])  Chronic pain of left knee At this point since she is roughly 80 to 90% better, I do not think an antibiotic is needed.  He will continue his present medication regimen.  Discussed the use of codeine to help with his pain if the ibuprofen for the knee pain is not successful.  He was comfortable with that.

## 2021-03-18 ENCOUNTER — Other Ambulatory Visit: Payer: Self-pay

## 2021-03-18 DIAGNOSIS — C9000 Multiple myeloma not having achieved remission: Secondary | ICD-10-CM

## 2021-03-18 MED ORDER — LENALIDOMIDE 15 MG PO CAPS: 15.0000 mg | ORAL_CAPSULE | Freq: Every day | ORAL | 0 refills | Status: DC

## 2021-03-31 ENCOUNTER — Inpatient Hospital Stay: Payer: Medicare Other | Attending: Hematology and Oncology

## 2021-03-31 ENCOUNTER — Encounter: Payer: Self-pay | Admitting: Hematology and Oncology

## 2021-03-31 ENCOUNTER — Inpatient Hospital Stay (HOSPITAL_BASED_OUTPATIENT_CLINIC_OR_DEPARTMENT_OTHER): Payer: Medicare Other | Admitting: Hematology and Oncology

## 2021-03-31 ENCOUNTER — Inpatient Hospital Stay: Payer: Medicare Other

## 2021-03-31 ENCOUNTER — Other Ambulatory Visit: Payer: Self-pay

## 2021-03-31 VITALS — BP 132/84 | HR 71

## 2021-03-31 DIAGNOSIS — C9000 Multiple myeloma not having achieved remission: Secondary | ICD-10-CM

## 2021-03-31 DIAGNOSIS — E669 Obesity, unspecified: Secondary | ICD-10-CM

## 2021-03-31 DIAGNOSIS — Z9221 Personal history of antineoplastic chemotherapy: Secondary | ICD-10-CM | POA: Diagnosis not present

## 2021-03-31 DIAGNOSIS — Z923 Personal history of irradiation: Secondary | ICD-10-CM | POA: Insufficient documentation

## 2021-03-31 DIAGNOSIS — I2510 Atherosclerotic heart disease of native coronary artery without angina pectoris: Secondary | ICD-10-CM | POA: Insufficient documentation

## 2021-03-31 DIAGNOSIS — Z79899 Other long term (current) drug therapy: Secondary | ICD-10-CM | POA: Diagnosis not present

## 2021-03-31 DIAGNOSIS — D61818 Other pancytopenia: Secondary | ICD-10-CM | POA: Insufficient documentation

## 2021-03-31 DIAGNOSIS — Z7982 Long term (current) use of aspirin: Secondary | ICD-10-CM | POA: Insufficient documentation

## 2021-03-31 DIAGNOSIS — I700 Atherosclerosis of aorta: Secondary | ICD-10-CM | POA: Diagnosis not present

## 2021-03-31 LAB — CBC WITH DIFFERENTIAL (CANCER CENTER ONLY)
Abs Immature Granulocytes: 0 10*3/uL (ref 0.00–0.07)
Basophils Absolute: 0 10*3/uL (ref 0.0–0.1)
Basophils Relative: 1 %
Eosinophils Absolute: 0.1 10*3/uL (ref 0.0–0.5)
Eosinophils Relative: 2 %
HCT: 38.2 % — ABNORMAL LOW (ref 39.0–52.0)
Hemoglobin: 13.3 g/dL (ref 13.0–17.0)
Immature Granulocytes: 0 %
Lymphocytes Relative: 40 %
Lymphs Abs: 1.3 10*3/uL (ref 0.7–4.0)
MCH: 37.6 pg — ABNORMAL HIGH (ref 26.0–34.0)
MCHC: 34.8 g/dL (ref 30.0–36.0)
MCV: 107.9 fL — ABNORMAL HIGH (ref 80.0–100.0)
Monocytes Absolute: 0.5 10*3/uL (ref 0.1–1.0)
Monocytes Relative: 16 %
Neutro Abs: 1.3 10*3/uL — ABNORMAL LOW (ref 1.7–7.7)
Neutrophils Relative %: 41 %
Platelet Count: 171 10*3/uL (ref 150–400)
RBC: 3.54 MIL/uL — ABNORMAL LOW (ref 4.22–5.81)
RDW: 13.9 % (ref 11.5–15.5)
WBC Count: 3.2 10*3/uL — ABNORMAL LOW (ref 4.0–10.5)
nRBC: 0 % (ref 0.0–0.2)

## 2021-03-31 LAB — CMP (CANCER CENTER ONLY)
ALT: 33 U/L (ref 0–44)
AST: 25 U/L (ref 15–41)
Albumin: 3.3 g/dL — ABNORMAL LOW (ref 3.5–5.0)
Alkaline Phosphatase: 64 U/L (ref 38–126)
Anion gap: 9 (ref 5–15)
BUN: 21 mg/dL (ref 8–23)
CO2: 24 mmol/L (ref 22–32)
Calcium: 8.5 mg/dL — ABNORMAL LOW (ref 8.9–10.3)
Chloride: 105 mmol/L (ref 98–111)
Creatinine: 0.75 mg/dL (ref 0.61–1.24)
GFR, Estimated: >60 mL/min (ref >60)
Glucose, Bld: 86 mg/dL (ref 70–99)
Potassium: 4.2 mmol/L (ref 3.5–5.1)
Sodium: 138 mmol/L (ref 135–145)
Total Bilirubin: 0.9 mg/dL (ref 0.3–1.2)
Total Protein: 6.3 g/dL — ABNORMAL LOW (ref 6.5–8.1)

## 2021-03-31 MED ORDER — DENOSUMAB 120 MG/1.7ML ~~LOC~~ SOLN
SUBCUTANEOUS | Status: AC
  Filled 2021-03-31: qty 1.7

## 2021-03-31 MED ORDER — DENOSUMAB 120 MG/1.7ML ~~LOC~~ SOLN
120.0000 mg | Freq: Once | SUBCUTANEOUS | Status: AC
  Administered 2021-03-31: 120 mg via SUBCUTANEOUS

## 2021-03-31 NOTE — Assessment & Plan Note (Signed)
His myeloma panel is pending I will call him with test results next week He tolerated 15 mg dose well without major pancytopenia We discussed the risk and benefits of continuing the current dose long-term and he is in agreement He will continue acyclovir for antimicrobial prophylaxis along with aspirin for DVT prophylaxis He will continue calcium with vitamin D He will proceed with Delton See today and I plan treatment only every 6 months for him due to history of bone pain after Delton See He denies recent dental issues and he sees his dentist every 4 months

## 2021-03-31 NOTE — Progress Notes (Signed)
Jesse Mcclure OFFICE PROGRESS NOTE  Patient Care Team: Denita Lung, MD as PCP - General (Family Medicine) Jesse Lark, MD as Consulting Physician (Hematology and Oncology)  ASSESSMENT & PLAN:  Multiple myeloma without remission [facility]) His myeloma panel is pending I will call him with test results next week He tolerated 15 mg dose well without major pancytopenia We discussed the risk and benefits of continuing the current dose long-term and he is in agreement He will continue acyclovir for antimicrobial prophylaxis along with aspirin for DVT prophylaxis He will continue calcium with vitamin D He will proceed with Delton See today and I plan treatment only every 6 months for him due to history of bone pain after Delton See He denies recent dental issues and he sees his dentist every 4 months  Pancytopenia, acquired ([place]) He has mild pancytopenia due to treatment He is not symptomatic His last B12 level was adequate Observe closely for now  Obesity (BMI 30-39.9) The patient is motivated to lose weight I continue to encourage him to do so  Orders Placed This Encounter  Procedures   CBC with Differential/Platelet    Standing Status:   Standing    Number of Occurrences:   22    Standing Expiration Date:   03/31/2022   Comprehensive metabolic panel    Standing Status:   Standing    Number of Occurrences:   33    Standing Expiration Date:   03/31/2022   Kappa/lambda light chains    Standing Status:   Standing    Number of Occurrences:   22    Standing Expiration Date:   03/31/2022   Multiple Myeloma Panel (SPEP&IFE w/QIG)    Standing Status:   Standing    Number of Occurrences:   22    Standing Expiration Date:   03/31/2022    All questions were answered. The patient knows to call the clinic with any problems, questions or concerns. The total time spent in the appointment was 20 minutes encounter with patients including review of chart and various tests results, discussions  about plan of care and coordination of care plan   Jesse Lark, MD 03/31/2021 10:43 AM  INTERVAL HISTORY: Please see below for problem oriented charting. He returns for further follow-up He has lost some weight He has no recent infection He bruises easily but no recent bleeding No new bone pain  SUMMARY OF ONCOLOGIC HISTORY: Oncology History  Multiple myeloma without remission ([place])  03/20/2019 Imaging   1. Tumor involving the L5 and S1 and S2 segments of the spine as described with mass effects upon the left L4, L5, S1 and more distal left sacral nerves as described above. 2. Does the patient have a history of malignancy? 3. Multiple plasmacytomas and metastatic disease could give this appearance   03/27/2019 Pathology Results   Soft Tissue Needle Core Biopsy, sacral mass - PLASMABLASTIC NEOPLASM. - SEE COMMENT. Microscopic Comment The sections show needle core biopsy fragments of soft tissue densely infiltrated by a relatively monomorphic infiltrate of atypical plasmacytoid cells characterized by vesicular or partially clumped chromatin and prominent nucleoli. This is associated with scattered mitosis. A battery of immunohistochemical stains was performed and show that the atypical plasmacytoid cells are positive for CD138, CD43 and cytoplasmic kappa. There is also weak positivity for CD56 and partial variable positivity for cyclin D1. The atypical plasmacytoid cells are negative for cytoplasmic lambda, CD10, PAX5, CD79a, CD20, CD3, CD5, CD34, EBV (ISH) and mostly negative for LCA. The overall  morphologic and histologic features are most compatible with a plasmablastic neoplasm. The differential diagnosis includes plasmablastic lymphoma and plasmablastic plasmacytoma/myeloma. Based on the overall phenotypic features and the clinical setting, plasmacytoma/myeloma is favored. Clinical correlation and hematologic evaluation is recommended   03/27/2019 Procedure   Technically successful  CT-guided biopsy of sacral soft tissue mass.   04/04/2019 Initial Diagnosis   Multiple myeloma without remission ([place])   04/11/2019 PET scan   IMPRESSION: 1. Previously noted expansile lesions involving the L5 vertebra and sacrum are again noted and exhibit intense FDG uptake compatible with metabolically active disease. A third focus of increased uptake without corresponding CT abnormality is noted within the intertrochanteric portions of the proximal right femur. 2. Small lucent lesion within the T3 vertebra is noted without corresponding FDG uptake. 3. Asymmetric left bladder wall thickening, etiology indeterminate. 4. Aortic Atherosclerosis (ICD10-I70.0). Lad coronary artery calcification.   04/12/2019 Cancer Staging   Staging form: Plasma Cell Myeloma and Plasma Cell Disorders, AJCC 8th Edition - Clinical stage from 04/12/2019: Beta-2-microglobulin (mg/L): 2, Albumin (g/dL): 3.6, ISS: Stage I, High-risk cytogenetics: Unknown, LDH: Unknown - Signed by Jesse Lark, MD on 04/12/2019    04/14/2019 Bone Marrow Biopsy   Bone Marrow, Aspirate,Biopsy, and Clot, right iliac bone - PLASMA CELL MYELOMA.   04/17/2019 - 07/11/2019 Chemotherapy   The patient had bortezemib, revlimid and dexamethasone for chemotherapy treatment.     08/17/2019 Bone Marrow Transplant   He received high dose melphalan followed by autologous stem cell transplant at [facility]   11/22/2019 Bone Marrow Biopsy   BONE MARROW biopsy at [facility]:       Mild monoclonal plasmacytosis (approximately 2%), kappa light chain restricted.    11/22/2019 PET scan   PET CT at [facility].  Similar size and appearance of mildly hypermetabolic lytic lesion in the L5 vertebral body and posterior elements.  2.  Otherwise, no substantial FDG uptake compared to background bone marrow in the additional osseous lucent lesions.    12/20/2019 - 05/09/2020 Radiation Therapy   Radiation Treatment Dates: 12/20/2019 through 01/08/2020 Site Technique  Total Dose (Gy) Dose per Fx (Gy) Completed Fx Beam Energies  Lumbar Spine: Spine 3D 35/35 2.5 14/14 15X        02/29/2020 PET scan   1. Similar size and appearance of expansile lytic lesion in the L5 vertebral body and posterior elements with minimal FDG activity.  2. Otherwise, no additional osseous lucent lesions with FDG uptake above background bone marrow   05/31/2020 -  Chemotherapy   The patient had Revlimid for chemotherapy treatment.       REVIEW OF SYSTEMS:   Constitutional: Denies fevers, chills or abnormal weight loss Eyes: Denies blurriness of vision Ears, nose, mouth, throat, and face: Denies mucositis or sore throat Respiratory: Denies cough, dyspnea or wheezes Cardiovascular: Denies palpitation, chest discomfort or lower extremity swelling Gastrointestinal:  Denies nausea, heartburn or change in bowel habits Lymphatics: Denies new lymphadenopathy or easy bruising Neurological:Denies numbness, tingling or new weaknesses Behavioral/Psych: Mood is stable, no new changes  All other systems were reviewed with the patient and are negative.  I have reviewed the past medical history, past surgical history, social history and family history with the patient and they are unchanged from previous note.  ALLERGIES:  has No Known Allergies.  MEDICATIONS:  Current Outpatient Medications  Medication Sig Dispense Refill   acetaminophen (TYLENOL) 500 MG tablet Take 500 mg by mouth every 6 (six) hours as needed.  acyclovir (ZOVIRAX) 400 MG tablet Take 1 tablet (400 mg total) by mouth 2 (two) times daily. 180 tablet 11   aspirin 325 MG tablet Take 325 mg by mouth daily.     calcium carbonate (TUMS - DOSED IN MG ELEMENTAL CALCIUM) 500 MG chewable tablet Chew 1 tablet by mouth 2 (two) times daily.     cholecalciferol (VITAMIN D3) 25 MCG (1000 UT) tablet Take 2,000 Units by mouth daily.     folic acid (FOLVITE) 1 MG tablet Take 1 mg by mouth daily.     HYDROcodone-acetaminophen  (NORCO/VICODIN) 5-325 MG tablet Take 1 tablet by mouth every 6 (six) hours as needed for moderate pain. 30 tablet 0   lenalidomide (REVLIMID) 15 MG capsule Take 1 capsule (15 mg total) by mouth daily. Take 1 capsule for 21 days, then stop for 7 days for a cycle of every 28 days 21 capsule 0   losartan-hydrochlorothiazide (HYZAAR) 100-12.5 MG tablet Take 1 tablet by mouth daily. 90 tablet 3   Multiple Vitamin (MULTI-VITAMIN) tablet Take by mouth.     prochlorperazine (COMPAZINE) 10 MG tablet Take 1 tablet (10 mg total) by mouth every 6 (six) hours as needed (Nausea or vomiting). 30 tablet 1   vitamin B-12 (CYANOCOBALAMIN) 1000 MCG tablet Take 1,000 mcg by mouth daily.     No current facility-administered medications for this visit.    PHYSICAL EXAMINATION: ECOG PERFORMANCE STATUS: 1 - Symptomatic but completely ambulatory  Vitals:   03/31/21 1027  BP: 128/77  Pulse: 70  Resp: 18  Temp: 98.3 F (36.8 C)  SpO2: 98%   Filed Weights   03/31/21 1027  Weight: 235 lb 6.4 oz (106.8 kg)    GENERAL:alert, no distress and comfortable SKIN: skin color, texture, turgor are normal, no rashes or significant lesions.  Noted mild skin bruises EYES: normal, Conjunctiva are pink and non-injected, sclera clear OROPHARYNX:no exudate, no erythema and lips, buccal mucosa, and tongue normal  NECK: supple, thyroid normal size, non-tender, without nodularity LYMPH:  no palpable lymphadenopathy in the cervical, axillary or inguinal LUNGS: clear to auscultation and percussion with normal breathing effort HEART: regular rate & rhythm and no murmurs and no lower extremity edema ABDOMEN:abdomen soft, non-tender and normal bowel sounds Musculoskeletal:no cyanosis of digits and no clubbing  NEURO: alert & oriented x 3 with fluent speech, no focal motor/sensory deficits  LABORATORY DATA:  I have reviewed the data as listed    Component Value Date/Time   NA 139 01/07/2021 0859   NA 139 10/11/2018 1115   NA  139 04/05/2014 0849   K 3.7 01/07/2021 0859   K 4.4 04/05/2014 0849   CL 102 01/07/2021 0859   CO2 26 01/07/2021 0859   CO2 23 04/05/2014 0849   GLUCOSE 126 (H) 01/07/2021 0859   GLUCOSE 109 04/05/2014 0849   BUN 17 01/07/2021 0859   BUN 23 10/11/2018 1115   BUN 16.8 04/05/2014 0849   CREATININE 0.86 01/07/2021 0859   CREATININE 0.91 07/29/2017 1004   CREATININE 0.9 04/05/2014 0849   CALCIUM 8.7 (L) 01/07/2021 0859   CALCIUM 8.7 04/05/2014 0849   PROT 6.7 01/07/2021 0859   PROT 7.2 10/11/2018 1115   PROT 6.7 04/05/2014 0849   ALBUMIN 3.8 01/07/2021 0859   ALBUMIN 4.7 10/11/2018 1115   ALBUMIN 3.8 04/05/2014 0849   AST 24 01/07/2021 0859   AST 26 04/05/2014 0849   ALT 37 01/07/2021 0859   ALT 30 04/05/2014 0849   ALKPHOS 58 01/07/2021 0859  ALKPHOS 75 04/05/2014 0849   BILITOT 1.1 01/07/2021 0859   BILITOT 0.71 04/05/2014 0849   GFRNONAA >60 01/07/2021 0859   GFRAA >60 06/14/2020 0830    No results found for: SPEP, UPEP  Lab Results  Component Value Date   WBC 3.2 (L) 03/31/2021   NEUTROABS 1.3 (L) 03/31/2021   HGB 13.3 03/31/2021   HCT 38.2 (L) 03/31/2021   MCV 107.9 (H) 03/31/2021   PLT 171 03/31/2021      Chemistry      Component Value Date/Time   NA 139 01/07/2021 0859   NA 139 10/11/2018 1115   NA 139 04/05/2014 0849   K 3.7 01/07/2021 0859   K 4.4 04/05/2014 0849   CL 102 01/07/2021 0859   CO2 26 01/07/2021 0859   CO2 23 04/05/2014 0849   BUN 17 01/07/2021 0859   BUN 23 10/11/2018 1115   BUN 16.8 04/05/2014 0849   CREATININE 0.86 01/07/2021 0859   CREATININE 0.91 07/29/2017 1004   CREATININE 0.9 04/05/2014 0849   GLU 10 04/10/2014 0000      Component Value Date/Time   CALCIUM 8.7 (L) 01/07/2021 0859   CALCIUM 8.7 04/05/2014 0849   ALKPHOS 58 01/07/2021 0859   ALKPHOS 75 04/05/2014 0849   AST 24 01/07/2021 0859   AST 26 04/05/2014 0849   ALT 37 01/07/2021 0859   ALT 30 04/05/2014 0849   BILITOT 1.1 01/07/2021 0859   BILITOT 0.71  04/05/2014 0849

## 2021-03-31 NOTE — Assessment & Plan Note (Signed)
The patient is motivated to lose weight I continue to encourage him to do so

## 2021-03-31 NOTE — Assessment & Plan Note (Signed)
He has mild pancytopenia due to treatment He is not symptomatic His last B12 level was adequate Observe closely for now

## 2021-04-01 LAB — KAPPA/LAMBDA LIGHT CHAINS
Kappa free light chain: 33.4 mg/L — ABNORMAL HIGH (ref 3.3–19.4)
Kappa, lambda light chain ratio: 1.62 (ref 0.26–1.65)
Lambda free light chains: 20.6 mg/L (ref 5.7–26.3)

## 2021-04-02 LAB — MULTIPLE MYELOMA PANEL, SERUM
Albumin SerPl Elph-Mcnc: 3.4 g/dL (ref 2.9–4.4)
Albumin/Glob SerPl: 1.5 (ref 0.7–1.7)
Alpha 1: 0.2 g/dL (ref 0.0–0.4)
Alpha2 Glob SerPl Elph-Mcnc: 0.6 g/dL (ref 0.4–1.0)
B-Globulin SerPl Elph-Mcnc: 0.8 g/dL (ref 0.7–1.3)
Gamma Glob SerPl Elph-Mcnc: 0.9 g/dL (ref 0.4–1.8)
Globulin, Total: 2.4 g/dL (ref 2.2–3.9)
IgA: 113 mg/dL (ref 61–437)
IgG (Immunoglobin G), Serum: 1003 mg/dL (ref 603–1613)
IgM (Immunoglobulin M), Srm: 29 mg/dL (ref 20–172)
M Protein SerPl Elph-Mcnc: 0.3 g/dL — ABNORMAL HIGH
Total Protein ELP: 5.8 g/dL — ABNORMAL LOW (ref 6.0–8.5)

## 2021-04-03 ENCOUNTER — Telehealth: Payer: Self-pay

## 2021-04-03 NOTE — Telephone Encounter (Signed)
Called and given below message. He verbalized understanding. 

## 2021-04-03 NOTE — Telephone Encounter (Signed)
-----   Message from Heath Lark, MD sent at 04/03/2021  9:02 AM EDT ----- Pls call and let him know myeloma panel is stable

## 2021-04-23 DIAGNOSIS — Z20822 Contact with and (suspected) exposure to covid-19: Secondary | ICD-10-CM | POA: Diagnosis not present

## 2021-04-28 DIAGNOSIS — M1712 Unilateral primary osteoarthritis, left knee: Secondary | ICD-10-CM | POA: Diagnosis not present

## 2021-05-05 ENCOUNTER — Telehealth: Payer: Self-pay

## 2021-05-05 ENCOUNTER — Other Ambulatory Visit: Payer: Self-pay | Admitting: Hematology and Oncology

## 2021-05-05 DIAGNOSIS — M1712 Unilateral primary osteoarthritis, left knee: Secondary | ICD-10-CM | POA: Diagnosis not present

## 2021-05-05 MED ORDER — HYDROCODONE-ACETAMINOPHEN 5-325 MG PO TABS: 1.0000 | ORAL_TABLET | Freq: Four times a day (QID) | ORAL | 0 refills | Status: DC | PRN

## 2021-05-05 NOTE — Telephone Encounter (Signed)
He called and left a message requesting a refill on Hydrocodone Rx. He has been having back pain and bone pain since Xgeva injection.

## 2021-05-05 NOTE — Telephone Encounter (Signed)
Called and left below message Rx sent. Ask him to call the office back for questions.

## 2021-05-05 NOTE — Telephone Encounter (Signed)
done

## 2021-05-12 ENCOUNTER — Telehealth: Payer: Self-pay

## 2021-05-12 NOTE — Telephone Encounter (Signed)
He called requesting fax # for Orthopedics to fax clearance form to Dr. Alvy Bimler. Given fax #,

## 2021-05-13 ENCOUNTER — Other Ambulatory Visit: Payer: Self-pay

## 2021-05-13 ENCOUNTER — Telehealth: Payer: Self-pay

## 2021-05-13 DIAGNOSIS — C9000 Multiple myeloma not having achieved remission: Secondary | ICD-10-CM

## 2021-05-13 MED ORDER — LENALIDOMIDE 15 MG PO CAPS
15.0000 mg | ORAL_CAPSULE | Freq: Every day | ORAL | 0 refills | Status: DC
Start: 2021-05-13 — End: 2021-06-16

## 2021-05-13 NOTE — Telephone Encounter (Signed)
Called back and given below message. He verbalized understanding.Marland KitchenHe will call the office back with Surgery date.

## 2021-05-13 NOTE — Telephone Encounter (Signed)
Called pt to advise of the need for pre operative appt. No and lvm for pt to call back. Kh

## 2021-05-13 NOTE — Telephone Encounter (Signed)
Called and left a message asking him to call the office back. Clearance form faxed back to Raliegh Ip with instructions for surgery.   Stop Revlimid 7 days prior to surgery. Let the office know when your surgery is scheduled and the office will schedule appt 7-10 days after surgery with Dr. Alvy Bimler.

## 2021-05-20 ENCOUNTER — Ambulatory Visit (INDEPENDENT_AMBULATORY_CARE_PROVIDER_SITE_OTHER): Payer: Medicare Other | Admitting: Family Medicine

## 2021-05-20 ENCOUNTER — Other Ambulatory Visit: Payer: Self-pay

## 2021-05-20 VITALS — BP 136/80 | HR 70 | Temp 98.4°F | Ht 67.5 in | Wt 238.2 lb

## 2021-05-20 DIAGNOSIS — R7309 Other abnormal glucose: Secondary | ICD-10-CM | POA: Diagnosis not present

## 2021-05-20 DIAGNOSIS — Z01818 Encounter for other preprocedural examination: Secondary | ICD-10-CM

## 2021-05-20 DIAGNOSIS — I440 Atrioventricular block, first degree: Secondary | ICD-10-CM | POA: Diagnosis not present

## 2021-05-20 DIAGNOSIS — C9000 Multiple myeloma not having achieved remission: Secondary | ICD-10-CM | POA: Diagnosis not present

## 2021-05-20 DIAGNOSIS — Z131 Encounter for screening for diabetes mellitus: Secondary | ICD-10-CM | POA: Diagnosis not present

## 2021-05-20 LAB — POCT GLYCOSYLATED HEMOGLOBIN (HGB A1C): Hemoglobin A1C: 5.1 % (ref 4.0–5.6)

## 2021-05-20 NOTE — Progress Notes (Signed)
   Subjective:    Patient ID: Jesse Mcclure., adult    DOB: [birth year], 70 y.o.   MRN: [id number]  HPI He is here for a preoperative evaluation.  He is scheduled for left TKR.  He has been cleared by oncology.  He does have a history of multiple myeloma and hemochromatosis.  This seems to be stable.  He has had no chest pain, shortness of breath, pulmonary symptoms.  No family history of heart disease.  Review of the record does indicate a few blood sugars that were elevated.   Review of Systems     Objective:   Physical Exam Alert and in no distress. Tympanic membranes and canals are normal. Pharyngeal area is normal. Neck is supple without adenopathy or thyromegaly. Cardiac exam shows a regular sinus rhythm without murmurs or gallops. Lungs are clear to auscultation. EKG read by me shows a rate of 65.  PR interval 212.  Minor ST-T changes were also seen. Hemoglobin A1c is 5.1      Assessment & Plan:  Pre-op evaluation  Diabetes mellitus screening  Multiple myeloma without remission ([place])  First degree AV block He is cleared by surgery.  I explained that he has a first-degree AV block but explained that that is of no major concern.

## 2021-05-21 ENCOUNTER — Telehealth: Payer: Self-pay | Admitting: *Deleted

## 2021-05-21 NOTE — Telephone Encounter (Signed)
Patient called to advise Dr. Alvy Bimler of scheduled knee surgery 07/15/21. He states his pre-op visit at [facility] is 9/28 and he wanted to have medication info available for that appt. He has questions regarding Revlimid pre and post op. Encouraged to discuss with Dr. Alvy Bimler during appt on 07/01/21.  He verbalized understanding.

## 2021-05-22 DIAGNOSIS — M1712 Unilateral primary osteoarthritis, left knee: Secondary | ICD-10-CM | POA: Diagnosis present

## 2021-06-03 DIAGNOSIS — C9000 Multiple myeloma not having achieved remission: Secondary | ICD-10-CM | POA: Diagnosis not present

## 2021-06-03 DIAGNOSIS — C9001 Multiple myeloma in remission: Secondary | ICD-10-CM | POA: Diagnosis not present

## 2021-06-03 DIAGNOSIS — Z9484 Stem cells transplant status: Secondary | ICD-10-CM | POA: Diagnosis not present

## 2021-06-16 ENCOUNTER — Other Ambulatory Visit: Payer: Self-pay

## 2021-06-16 DIAGNOSIS — C9000 Multiple myeloma not having achieved remission: Secondary | ICD-10-CM

## 2021-06-16 MED ORDER — LENALIDOMIDE 15 MG PO CAPS: 15.0000 mg | ORAL_CAPSULE | Freq: Every day | ORAL | 0 refills | Status: DC

## 2021-06-24 NOTE — Progress Notes (Signed)
Sent message, via epic in basket, requesting orders in epic from surgeon.  

## 2021-06-30 ENCOUNTER — Encounter: Payer: Self-pay | Admitting: Family Medicine

## 2021-06-30 ENCOUNTER — Telehealth: Payer: Self-pay

## 2021-06-30 ENCOUNTER — Other Ambulatory Visit: Payer: Self-pay

## 2021-06-30 ENCOUNTER — Telehealth (INDEPENDENT_AMBULATORY_CARE_PROVIDER_SITE_OTHER): Payer: Medicare Other | Admitting: Family Medicine

## 2021-06-30 VITALS — Ht 67.5 in | Wt 238.0 lb

## 2021-06-30 DIAGNOSIS — U071 COVID-19: Secondary | ICD-10-CM

## 2021-06-30 DIAGNOSIS — C9000 Multiple myeloma not having achieved remission: Secondary | ICD-10-CM | POA: Diagnosis not present

## 2021-06-30 NOTE — Progress Notes (Signed)
   Subjective:    Patient ID: Jesse Guest., adult    DOB: 20-Oct-1950, 70 y.o.   MRN: [id number]  HPI Documentation for virtual audio and video telecommunications through [facility] encounter: The patient was located at home. 2 patient identifiers used.  The provider was located in the office. The patient did consent to this visit and is aware of possible charges through their insurance for this visit. The other persons participating in this telemedicine service were none. Time spent on call was 5 minutes and in review of previous records >15 minutes total for counseling and coordination of care. This virtual service is not related to other E/M service within previous 7 days.  He states that on Saturday he developed some malaise with a slight cough and postnasal drainage.  Sunday he states he actually felt a little bit better but decided to get tested and was positive yesterday.  Today he has a slight cough with some nasal congestion but no fever, chills, shortness of breath or malaise. He does have multiple myeloma but is asymptomatic at the present time. Review of Systems     Objective:   Physical Exam Alert and visually in no distress otherwise not examined       Assessment & Plan:  COVID-19  Multiple myeloma without remission ([facility]) Explained that he would need to stay sequestered to the end of the week.  He should postpone any appointments until later into the next week.  He does have surgery scheduled for October 11.  Explained the potential use of Paxlovid.  He is comfortable waiting since he is doing well.  Explained that after the 5-day sequestration timeframe he should then wear a mask for another 5 days.

## 2021-06-30 NOTE — Telephone Encounter (Signed)
Returned his call. He tested positive for covid yesterday. Canceled 9/27 appts. His PCP will monitor his symptoms, his only symptom now is nasal stuffiness. PCP will send antiviral if needed. Sent scheduling message to reschedule appts.

## 2021-07-01 ENCOUNTER — Ambulatory Visit: Payer: Medicare Other | Admitting: Hematology and Oncology

## 2021-07-01 ENCOUNTER — Telehealth: Payer: Self-pay | Admitting: Hematology and Oncology

## 2021-07-01 ENCOUNTER — Other Ambulatory Visit: Payer: Medicare Other

## 2021-07-01 NOTE — Telephone Encounter (Signed)
Scheduled per sch msg. Called and spoke with patient. Confirmed appts  

## 2021-07-02 ENCOUNTER — Encounter (HOSPITAL_COMMUNITY): Admission: RE | Admit: 2021-07-02 | Payer: Medicare Other | Source: Ambulatory Visit

## 2021-07-05 DIAGNOSIS — I2699 Other pulmonary embolism without acute cor pulmonale: Secondary | ICD-10-CM

## 2021-07-05 HISTORY — DX: Other pulmonary embolism without acute cor pulmonale: I26.99

## 2021-07-07 DIAGNOSIS — M1712 Unilateral primary osteoarthritis, left knee: Secondary | ICD-10-CM | POA: Diagnosis not present

## 2021-07-07 NOTE — H&P (Signed)
KNEE ARTHROPLASTY ADMISSION H&P  Patient ID: Jesse Mcclure. MRN: [id number] DOB/AGE: 02/23/1951 70 y.o.  Chief Complaint: left knee pain.  Planned Procedure Date: 07/15/21 Medical Clearance by Dr. Redmond School   Additional clearance by Dr. Alvy Bimler (oncology)  HPI: Jesse Mcclure. is a 70 y.o. adult who presents for evaluation of djd left knee. The patient has a history of pain and functional disability in the left knee due to arthritis and has failed non-surgical conservative treatments for greater than 12 weeks to include NSAID's and/or analgesics, corticosteriod injections, and activity modification.  Onset of symptoms was gradual, starting 1 years ago with rapidlly worsening course since that time. The patient noted prior procedures on the knee to include  arthroscopy and menisectomy on the left knee.  Patient currently rates pain at 8 out of 10 with activity. Patient has worsening of pain with activity and weight bearing, pain that interferes with activities of daily living, and crepitus.  Patient has evidence of joint space narrowing by imaging studies.  There is no active infection.  Past Medical History:  Diagnosis Date   BPH (benign prostatic hypertrophy)    Degenerative disc disease    Diverticulosis    Hemochromatosis    HTN (hypertension)    Hyperlipidemia    Polio    Status post Nissen fundoplication (without gastrostomy tube) procedure    for hiatal hernia.   Past Surgical History:  Procedure Laterality Date   FOOT SURGERY     HIATAL HERNIA REPAIR     OTHER SURGICAL HISTORY     Cataract Surgery--Both eyes   Undescended testes Right 1968   No Known Allergies Prior to Admission medications   Medication Sig Start Date End Date Taking? Authorizing Provider  acetaminophen (TYLENOL) 650 MG CR tablet Take 650 mg by mouth every 8 (eight) hours as needed for pain.   Yes [provider]  acyclovir (ZOVIRAX) 400 MG tablet Take 1 tablet (400 mg total) by mouth 2  (two) times daily. 01/07/21  Yes Heath Lark, MD  aspirin 325 MG tablet Take 325 mg by mouth daily.   Yes [provider]  calcium carbonate (TUMS - DOSED IN MG ELEMENTAL CALCIUM) 500 MG chewable tablet Chew 1-2 tablets by mouth daily as needed for heartburn.   Yes [provider]  Cholecalciferol (VITAMIN D) 50 MCG (2000 UT) tablet Take 2,000 Units by mouth daily.   Yes [provider]  folic acid (FOLVITE) 262 MCG tablet Take 400 mcg by mouth daily.   Yes [provider]  HYDROcodone-acetaminophen (NORCO/VICODIN) 5-325 MG tablet Take 1 tablet by mouth every 6 (six) hours as needed for moderate pain. 05/05/21  Yes Gorsuch, Ni, MD  lenalidomide (REVLIMID) 15 MG capsule Take 1 capsule (15 mg total) by mouth daily. Take 1 capsule for 21 days, then stop for 7 days for a cycle of every 28 days 06/16/21  Yes Gorsuch, Ni, MD  losartan-hydrochlorothiazide (HYZAAR) 100-12.5 MG tablet Take 1 tablet by mouth daily. 12/20/20  Yes Denita Lung, MD  Multiple Vitamin (MULTI-VITAMIN) tablet Take by mouth. 08/30/19  Yes [provider]  Polyethyl Glycol-Propyl Glycol (SYSTANE OP) Place 1 drop into both eyes daily as needed (dry eyes).   Yes [provider]  vitamin B-12 (CYANOCOBALAMIN) 1000 MCG tablet Take 1,000 mcg by mouth daily.   Yes [provider]   Social History   Socioeconomic History   Marital status: Single    Spouse name: Not on file  Number of children: Not on file   Years of education: Not on file   Highest education level: Not on file  Occupational History   Not on file  Tobacco Use   Smoking status: Never   Smokeless tobacco: Never  Substance and Sexual Activity   Alcohol use: Yes    Alcohol/week: 4.0 standard drinks    Types: 4 drink(s) per week    Comment: 3-4 drinks per week.    Drug use: No   Sexual activity: Yes  Other Topics Concern   Not on file  Social History Narrative   Not on file   Social Determinants of  Health   Financial Resource Strain: Not on file  Food Insecurity: Not on file  Transportation Needs: Not on file  Physical Activity: Not on file  Stress: Not on file  Social Connections: Not on file   Family History  Problem Relation Age of Onset   Breast cancer Mother    Breast cancer Sister     ROS: Currently denies lightheadedness, dizziness, Fever, chills, CP, SOB.   No personal history of DVT, PE, MI, or CVA. No loose teeth or dentures All other systems have been reviewed and were otherwise currently negative with the exception of those mentioned in the HPI and as above.  Objective: Vitals: Ht: 5\' 8"  Wt: 235.8 lbs Temp: 98.2 BP: 146/88 Pulse: 99 O2 96% on room air.   Physical Exam: General: Alert, NAD.  Antalgic Gait  HEENT: EOMI, Good Neck Extension  Pulm: No increased work of breathing.  Clear B/L A/P w/o crackle or wheeze.  CV: RRR, No m/g/r appreciated  GI: soft, NT, ND Neuro: Neuro without gross focal deficit.  Sensation intact distally Skin: No lesions in the area of chief complaint MSK/Surgical Site: left knee w/o redness or effusion.  No JLT. ROM 5-95.  5/5 strength in extension and flexion.  +EHL/FHL.  NVI.  Stable varus and valgus stress.    Imaging Review Plain radiographs demonstrate degenerative joint disease of the left knee.   Preoperative templating of the joint replacement has been completed, documented, and submitted to the Operating Room personnel in order to optimize intra-operative equipment management.  Assessment: djd left knee Principal Problem:   Osteoarthritis of left knee   Plan: Plan for Procedure(s): TOTAL KNEE ARTHROPLASTY  The patient history, physical exam, clinical judgement of the provider and imaging are consistent with end stage degenerative joint disease and total joint arthroplasty is deemed medically necessary. The treatment options including medical management, injection therapy, and arthroplasty were discussed at length.  The risks and benefits of Procedure(s): TOTAL KNEE ARTHROPLASTY were presented and reviewed.  The risks of nonoperative treatment, versus surgical intervention including but not limited to continued pain, aseptic loosening, stiffness, dislocation/subluxation, infection, bleeding, nerve injury, blood clots, cardiopulmonary complications, morbidity, mortality, among others were discussed. The patient verbalizes understanding and wishes to proceed with the plan.  Patient is being admitted for inpatient treatment for surgery, pain control, PT, prophylactic antibiotics, VTE prophylaxis, progressive ambulation, ADL's and discharge planning.   Dental prophylaxis discussed and recommended for 2 years postoperatively  The patient does meet the criteria for TXA which will be used perioperatively.   Xarelto  will be used postoperatively for DVT prophylaxis in addition to SCDs, and early ambulation.  The patient is planning to be discharged home with OPPT in care of his partner Jesse Mcclure.     Ventura Bruns, PA-C 07/07/2021 3:37 PM

## 2021-07-08 NOTE — Patient Instructions (Addendum)
DUE TO COVID-19 ONLY ONE VISITOR IS ALLOWED TO COME WITH YOU AND STAY IN THE WAITING ROOM ONLY DURING PRE OP AND PROCEDURE DAY OF SURGERY IF YOU ARE GOING HOME AFTER SURGERY. IF YOU ARE SPENDING THE NIGHT 2 PEOPLE MAY VISIT WITH YOU IN YOUR PRIVATE ROOM AFTER SURGERY UNTIL VISITING  HOURS ARE OVER AT 8:00 PM AND 1 VISITOR CAN SPEND THE NIGHT.                  [name].     Your procedure is scheduled on: 07/15/21   [facility]   Report to Short stay at 5:15 AM     Call this number if you have problems the morning of surgery [phone]    No food after midnight.    You may have clear liquid until 4:30 AM.    At 4:00 AM drink pre surgery drink.   Nothing by mouth after 4:30 AM.   CLEAR LIQUID DIET   Foods Allowed                                                                     Foods Excluded                                                                                             liquids that you cannot  Plain Jell-O any favor except red or purple                                           see through such as: Fruit ices (not with fruit pulp)                                     milk, soups, orange juice  Iced Popsicles                                    All solid food Carbonated beverages, regular and diet                                    Cranberry, grape and apple juices Sports drinks like Gatorade Lightly seasoned clear broth or consume(fat free) Sugar     BRUSH YOUR TEETH MORNING OF SURGERY AND RINSE YOUR MOUTH OUT, NO CHEWING GUM CANDY OR MINTS.     Take these medicines the morning of surgery with A SIP OF WATER: Acyclovir, Lenalidomide               Stop taking ___ASA________on  _10/4_________as instructed by __Dr. Landau___________.                     You may not have any metal on your body including              piercings  Do not wear jewelry, lotions, powders or  deodorant             Men may shave face and  neck.   Do not bring valuables to the hospital. [place].  Contacts, dentures or bridgework may not be worn into surgery.  Leave suitcase in the car. After surgery it may be brought to your room.     Patients discharged the day of surgery will not be allowed to drive home.  IF YOU ARE HAVING SURGERY AND GOING HOME THE SAME DAY, YOU MUST HAVE AN ADULT TO DRIVE YOU HOME AND BE WITH YOU FOR 24 HOURS. YOU MAY GO HOME BY TAXI OR UBER OR ORTHERWISE, BUT AN ADULT MUST ACCOMPANY YOU HOME AND STAY WITH YOU FOR 24 HOURS.  Name and phone number of your driver:  Special Instructions: N/A              Please read over the following fact sheets you were given: _____________________________________________________________________             [facility] - Preparing for Surgery Before surgery, you can play an important role.  Because skin is not sterile, your skin needs to be as free of germs as possible.  You can reduce the number of germs on your skin by washing with CHG (chlorahexidine gluconate) soap before surgery.  CHG is an antiseptic cleaner which kills germs and bonds with the skin to continue killing germs even after washing. Please DO NOT use if you have an allergy to CHG or antibacterial soaps.  If your skin becomes reddened/irritated stop using the CHG and inform your nurse when you arrive at Short Stay.  You may shave your face/neck. Please follow these instructions carefully:  1.  Shower with CHG Soap the night before surgery and the  morning of Surgery.  2.  If you choose to wash your hair, wash your hair first as usual with your  normal  shampoo.  3.  After you shampoo, rinse your hair and body thoroughly to remove the  shampoo.                            4.  Use CHG as you would any other liquid soap.  You can apply chg directly  to the skin and wash                       Gently with a scrungie or clean washcloth.  5.  Apply the CHG Soap  to your body ONLY FROM THE NECK DOWN.   Do not use on face/ open                           Wound or open sores. Avoid contact with eyes, ears mouth and genitals (private parts).                       Wash face,  Genitals (private parts) with your normal soap.  6.  Wash thoroughly, paying special attention to the area where your surgery  will be performed.  7.  Thoroughly rinse your body with warm water from the neck down.  8.  DO NOT shower/wash with your normal soap after using and rinsing off  the CHG Soap.                9.  Pat yourself dry with a clean towel.            10.  Wear clean pajamas.            11.  Place clean sheets on your bed the night of your first shower and do not  sleep with pets. Day of Surgery : Do not apply any lotions/deodorants the morning of surgery.  Please wear clean clothes to the hospital/surgery center.  FAILURE TO FOLLOW THESE INSTRUCTIONS MAY RESULT IN THE CANCELLATION OF YOUR SURGERY PATIENT SIGNATURE_________________________________  NURSE SIGNATURE__________________________________  ________________________________________________________________________   Adam Phenix  An incentive spirometer is a tool that can help keep your lungs clear and active. This tool measures how well you are filling your lungs with each breath. Taking long deep breaths may help reverse or decrease the chance of developing breathing (pulmonary) problems (especially infection) following: A long period of time when you are unable to move or be active. BEFORE THE PROCEDURE  If the spirometer includes an indicator to show your best effort, your nurse or respiratory therapist will set it to a desired goal. If possible, sit up straight or lean slightly forward. Try not to slouch. Hold the incentive spirometer in an upright position. INSTRUCTIONS FOR USE  Sit on the edge of your bed if possible, or sit up as far as you can in bed or on a chair. Hold the  incentive spirometer in an upright position. Breathe out normally. Place the mouthpiece in your mouth and seal your lips tightly around it. Breathe in slowly and as deeply as possible, raising the piston or the ball toward the top of the column. Hold your breath for 3-5 seconds or for as long as possible. Allow the piston or ball to fall to the bottom of the column. Remove the mouthpiece from your mouth and breathe out normally. Rest for a few seconds and repeat Steps 1 through 7 at least 10 times every 1-2 hours when you are awake. Take your time and take a few normal breaths between deep breaths. The spirometer may include an indicator to show your best effort. Use the indicator as a goal to work toward during each repetition. After each set of 10 deep breaths, practice coughing to be sure your lungs are clear. If you have an incision (the cut made at the time of surgery), support your incision when coughing by placing a pillow or rolled up towels firmly against it. Once you are able to get out of bed, walk around indoors and cough well. You may stop using the incentive spirometer when instructed by your caregiver.  RISKS AND COMPLICATIONS Take your time so you do not get dizzy or light-headed. If you are in pain, you may need to take or ask for pain medication before doing incentive spirometry. It is harder to take a deep breath if you are having pain. AFTER USE Rest and breathe slowly and easily. It can be helpful to keep track of a log of your progress. Your caregiver can provide you with a simple table to help with this. If you are using  the spirometer at home, follow these instructions: [place] IF:  You are having difficultly using the spirometer. You have trouble using the spirometer as often as instructed. Your pain medication is not giving enough relief while using the spirometer. You develop fever of 100.5 F (38.1 C) or higher. SEEK IMMEDIATE MEDICAL CARE IF:  You cough  up bloody sputum that had not been present before. You develop fever of 102 F (38.9 C) or greater. You develop worsening pain at or near the incision site. MAKE SURE YOU:  Understand these instructions. Will watch your condition. Will get help right away if you are not doing well or get worse. Document Released: 02/01/2007 Document Revised: 12/14/2011 Document Reviewed: 04/04/2007 [facility] Patient Information 2014 [place], [place].   ________________________________________________________________________

## 2021-07-09 ENCOUNTER — Other Ambulatory Visit: Payer: Self-pay

## 2021-07-09 ENCOUNTER — Encounter (HOSPITAL_COMMUNITY)
Admission: RE | Admit: 2021-07-09 | Discharge: 2021-07-09 | Disposition: A | Discharge status: Home / Self Care | Payer: Medicare Other | Source: Ambulatory Visit | Attending: Orthopedic Surgery | Admitting: Orthopedic Surgery

## 2021-07-09 ENCOUNTER — Encounter (HOSPITAL_COMMUNITY): Payer: Self-pay

## 2021-07-09 DIAGNOSIS — I10 Essential (primary) hypertension: Secondary | ICD-10-CM | POA: Diagnosis not present

## 2021-07-09 DIAGNOSIS — E119 Type 2 diabetes mellitus without complications: Secondary | ICD-10-CM | POA: Insufficient documentation

## 2021-07-09 DIAGNOSIS — Z8612 Personal history of poliomyelitis: Secondary | ICD-10-CM | POA: Diagnosis not present

## 2021-07-09 DIAGNOSIS — M1712 Unilateral primary osteoarthritis, left knee: Secondary | ICD-10-CM | POA: Insufficient documentation

## 2021-07-09 DIAGNOSIS — Z8579 Personal history of other malignant neoplasms of lymphoid, hematopoietic and related tissues: Secondary | ICD-10-CM | POA: Insufficient documentation

## 2021-07-09 DIAGNOSIS — Z01812 Encounter for preprocedural laboratory examination: Secondary | ICD-10-CM | POA: Diagnosis not present

## 2021-07-09 DIAGNOSIS — Z79899 Other long term (current) drug therapy: Secondary | ICD-10-CM | POA: Diagnosis not present

## 2021-07-09 DIAGNOSIS — Z7982 Long term (current) use of aspirin: Secondary | ICD-10-CM | POA: Diagnosis not present

## 2021-07-09 HISTORY — DX: Multiple myeloma not having achieved remission: C90.00

## 2021-07-09 LAB — SURGICAL PCR SCREEN
MRSA, PCR: NEGATIVE
Staphylococcus aureus: NEGATIVE

## 2021-07-09 LAB — CBC
HCT: 43.2 % (ref 39.0–52.0)
Hemoglobin: 14.8 g/dL (ref 13.0–17.0)
MCH: 37.4 pg — ABNORMAL HIGH (ref 26.0–34.0)
MCHC: 34.3 g/dL (ref 30.0–36.0)
MCV: 109.1 fL — ABNORMAL HIGH (ref 80.0–100.0)
Platelets: 164 K/uL (ref 150–400)
RBC: 3.96 MIL/uL — ABNORMAL LOW (ref 4.22–5.81)
RDW: 13.4 % (ref 11.5–15.5)
WBC: 4.2 K/uL (ref 4.0–10.5)
nRBC: 0 % (ref 0.0–0.2)

## 2021-07-09 LAB — BASIC METABOLIC PANEL WITH GFR
Anion gap: 9 (ref 5–15)
BUN: 19 mg/dL (ref 8–23)
CO2: 28 mmol/L (ref 22–32)
Calcium: 8.5 mg/dL — ABNORMAL LOW (ref 8.9–10.3)
Chloride: 104 mmol/L (ref 98–111)
Creatinine, Ser: 0.88 mg/dL (ref 0.61–1.24)
GFR, Estimated: >60 mL/min (ref >60)
Glucose, Bld: 76 mg/dL (ref 70–99)
Potassium: 3.7 mmol/L (ref 3.5–5.1)
Sodium: 141 mmol/L (ref 135–145)

## 2021-07-09 NOTE — Progress Notes (Addendum)
COVID test- No , 90 day policy  PCP - Dr. Glo Herring LOV 06/30/21 telehealth Cardiologist - none Oncology Dr. Alvy Bimler LOV 05/20/21  Chest x-ray - no EKG - 05/22/21-epic Stress Test - 2011 ECHO - no Cardiac Cath - no Pacemaker/ICD device last checked:NA  Sleep Study - no Stop Bang score 7. PCP notified CPAP - no  Fasting Blood Sugar - na Checks Blood Sugar _____ times a day  Blood Thinner Instructions:ASA 325/ Dr. Alvy Bimler Aspirin Instructions:Stop 7 days Prior to [employer] Last Dose:07/08/21  Anesthesia review: yes  Patient denies shortness of breath, fever, cough and chest pain at PAT appointment  Pt has no SOB with activities. Patient verbalized understanding of instructions that were given to them at the PAT appointment. Patient was also instructed that they will need to review over the PAT instructions again at home before surgery. yes

## 2021-07-10 NOTE — Progress Notes (Signed)
Anesthesia Chart Review   Case: [id number] Date/Time: 07/15/21 0715   Procedure: TOTAL KNEE ARTHROPLASTY (Left: Knee)   Anesthesia type: Choice   Pre-op diagnosis: djd left knee   Location: [facility] ROOM 07 / [facility]   Surgeons: Marchia Bond, MD       DISCUSSION:69 y.o. never smoker with h/o HTN, DM (A1C 5.1), polio, multiple myeloma, BPH, djd left knee scheduled for above procedure 07/15/21 with Dr. Marchia Bond.   Pt seen by PCP 05/20/2021 for preoperative evaluation. Per OV note, "He is cleared by surgery.  I explained that he has a first-degree AV block but explained that that is of no major concern."  Pt tested positive for COVID 06/29/2021. Stable at PAT visit 07/09/2021.  Pre-procedural COVID test not indicated.  VS: BP (!) 162/99   Pulse 74   Temp 37.1 C (Oral)   Resp 20   Ht _0  (1.727 m)   Wt 105.2 kg   SpO2 97%   BMI 35.28 kg/m   PROVIDERS: Denita Lung, MD is PCP   Heath Lark, MD is Cardiologist LABS: Labs reviewed: Acceptable for surgery. (all labs ordered are listed, but only abnormal results are displayed)  Labs Reviewed  BASIC METABOLIC PANEL - Abnormal; Notable for the following components:      Result Value   Calcium 8.5 (*)    All other components within normal limits  CBC - Abnormal; Notable for the following components:   RBC 3.96 (*)    MCV 109.1 (*)    MCH 37.4 (*)    All other components within normal limits  SURGICAL PCR SCREEN     IMAGES:   EKG: 05/22/2021 Rate 65 bpm  Sinus rhythm with 1st degree AV block  Nonspecific T wave abnormality  CV:  Past Medical History:  Diagnosis Date   BPH (benign prostatic hypertrophy)    Degenerative disc disease    Diverticulosis    Hemochromatosis    HTN (hypertension)    Hyperlipidemia    Multiple myeloma (HCC)    Polio    Status post Nissen fundoplication (without gastrostomy tube) procedure    for hiatal hernia.    Past Surgical History:  Procedure Laterality Date   FOOT SURGERY      HIATAL HERNIA REPAIR     OTHER SURGICAL HISTORY     Cataract Surgery--Both eyes   Undescended testes Right 1968    MEDICATIONS:  acetaminophen (TYLENOL) 650 MG CR tablet   acyclovir (ZOVIRAX) 400 MG tablet   aspirin 325 MG tablet   calcium carbonate (TUMS - DOSED IN MG ELEMENTAL CALCIUM) 500 MG chewable tablet   Cholecalciferol (VITAMIN D) 50 MCG (7482 UT) tablet   folic acid (FOLVITE) 707 MCG tablet   HYDROcodone-acetaminophen (NORCO/VICODIN) 5-325 MG tablet   lenalidomide (REVLIMID) 15 MG capsule   losartan-hydrochlorothiazide (HYZAAR) 100-12.5 MG tablet   Multiple Vitamin (MULTI-VITAMIN) tablet   Polyethyl Glycol-Propyl Glycol (SYSTANE OP)   vitamin B-12 (CYANOCOBALAMIN) 1000 MCG tablet   No current facility-administered medications for this encounter.     Jesse Felix Ward, PA-C [facility]Surgical Testing [phone]

## 2021-07-10 NOTE — Anesthesia Preprocedure Evaluation (Addendum)
Anesthesia Evaluation  Patient identified by MRN, date of birth, ID band Patient awake    Reviewed: Allergy & Precautions, NPO status , Patient's Chart, lab work & pertinent test results  Airway Mallampati: III  TM Distance: >3 FB Neck ROM: Full    Dental  (+) Chipped,    Pulmonary neg pulmonary ROS,    Pulmonary exam normal breath sounds clear to auscultation       Cardiovascular hypertension, Pt. on medications Normal cardiovascular exam Rhythm:Regular Rate:Normal  ECG: SR, rate 65   Neuro/Psych negative neurological ROS  negative psych ROS   GI/Hepatic negative GI ROS, (+)     substance abuse  ,   Endo/Other  Multiple myeloma   Renal/GU negative Renal ROS     Musculoskeletal  (+) Arthritis ,   Abdominal (+) + obese,   Peds  Hematology HLD   Anesthesia Other Findings djd left knee    Reproductive/Obstetrics                           Anesthesia Physical Anesthesia Plan  ASA: 2  Anesthesia Plan: Regional and General   Post-op Pain Management: GA combined w/ Regional for post-op pain   Induction: Intravenous  PONV Risk Score and Plan: 2 and Ondansetron, Dexamethasone, Midazolam and Treatment may vary due to age or medical condition  Airway Management Planned: Oral ETT and Video Laryngoscope Planned  Additional Equipment:   Intra-op Plan:   Post-operative Plan: Extubation in OR  Informed Consent: I have reviewed the patients History and Physical, chart, labs and discussed the procedure including the risks, benefits and alternatives for the proposed anesthesia with the patient or authorized representative who has indicated his/her understanding and acceptance.     Dental advisory given  Plan Discussed with: CRNA  Anesthesia Plan Comments: (Reviewed PAT note 07/09/2021, Konrad Felix Ward, PA-C)      Anesthesia Quick Evaluation

## 2021-07-15 ENCOUNTER — Ambulatory Visit (HOSPITAL_COMMUNITY): Payer: Medicare Other | Admitting: Physician Assistant

## 2021-07-15 ENCOUNTER — Encounter (HOSPITAL_COMMUNITY)
Admission: RE | Disposition: A | Discharge status: Home / Self Care | Payer: Self-pay | Source: Home / Self Care | Attending: Orthopedic Surgery

## 2021-07-15 ENCOUNTER — Ambulatory Visit (HOSPITAL_COMMUNITY)
Admission: RE | Admit: 2021-07-15 | Discharge: 2021-07-16 | Disposition: A | Discharge status: Home / Self Care | Payer: Medicare Other | Attending: Orthopedic Surgery | Admitting: Orthopedic Surgery

## 2021-07-15 ENCOUNTER — Ambulatory Visit (HOSPITAL_COMMUNITY): Payer: Medicare Other | Admitting: Anesthesiology

## 2021-07-15 ENCOUNTER — Ambulatory Visit (HOSPITAL_COMMUNITY): Payer: Medicare Other

## 2021-07-15 ENCOUNTER — Encounter (HOSPITAL_COMMUNITY): Payer: Self-pay | Admitting: Orthopedic Surgery

## 2021-07-15 ENCOUNTER — Other Ambulatory Visit: Payer: Self-pay

## 2021-07-15 DIAGNOSIS — Z6835 Body mass index (BMI) 35.0-35.9, adult: Secondary | ICD-10-CM | POA: Insufficient documentation

## 2021-07-15 DIAGNOSIS — E785 Hyperlipidemia, unspecified: Secondary | ICD-10-CM | POA: Diagnosis not present

## 2021-07-15 DIAGNOSIS — Z23 Encounter for immunization: Secondary | ICD-10-CM | POA: Diagnosis not present

## 2021-07-15 DIAGNOSIS — Z8612 Personal history of poliomyelitis: Secondary | ICD-10-CM | POA: Insufficient documentation

## 2021-07-15 DIAGNOSIS — Z7982 Long term (current) use of aspirin: Secondary | ICD-10-CM | POA: Diagnosis not present

## 2021-07-15 DIAGNOSIS — Z79899 Other long term (current) drug therapy: Secondary | ICD-10-CM | POA: Diagnosis not present

## 2021-07-15 DIAGNOSIS — Z96652 Presence of left artificial knee joint: Secondary | ICD-10-CM

## 2021-07-15 DIAGNOSIS — M1712 Unilateral primary osteoarthritis, left knee: Secondary | ICD-10-CM | POA: Diagnosis present

## 2021-07-15 DIAGNOSIS — E669 Obesity, unspecified: Secondary | ICD-10-CM | POA: Diagnosis not present

## 2021-07-15 DIAGNOSIS — Z8579 Personal history of other malignant neoplasms of lymphoid, hematopoietic and related tissues: Secondary | ICD-10-CM | POA: Insufficient documentation

## 2021-07-15 DIAGNOSIS — I10 Essential (primary) hypertension: Secondary | ICD-10-CM | POA: Diagnosis not present

## 2021-07-15 DIAGNOSIS — G8918 Other acute postprocedural pain: Secondary | ICD-10-CM | POA: Diagnosis not present

## 2021-07-15 HISTORY — PX: TOTAL KNEE ARTHROPLASTY: SHX125

## 2021-07-15 SURGERY — ARTHROPLASTY, KNEE, TOTAL
Anesthesia: Regional | Site: Knee | Laterality: Left

## 2021-07-15 MED ORDER — FENTANYL CITRATE (PF) 100 MCG/2ML IJ SOLN
INTRAMUSCULAR | Status: AC
  Filled 2021-07-15: qty 2

## 2021-07-15 MED ORDER — POTASSIUM CHLORIDE IN NACL 20-0.45 MEQ/L-% IV SOLN
INTRAVENOUS | Status: DC
  Filled 2021-07-15 (×2): qty 1000

## 2021-07-15 MED ORDER — LACTATED RINGERS IV SOLN: INTRAVENOUS | Status: DC

## 2021-07-15 MED ORDER — ONDANSETRON HCL 4 MG PO TABS
4.0000 mg | ORAL_TABLET | Freq: Four times a day (QID) | ORAL | Status: DC | PRN
  Administered 2021-07-15: 4 mg via ORAL
  Filled 2021-07-15: qty 1

## 2021-07-15 MED ORDER — POVIDONE-IODINE 7.5 % EX SOLN: Freq: Once | CUTANEOUS | Status: DC

## 2021-07-15 MED ORDER — BISACODYL 10 MG RE SUPP: 10.0000 mg | Freq: Every day | RECTAL | Status: DC | PRN

## 2021-07-15 MED ORDER — ALUM & MAG HYDROXIDE-SIMETH 200-200-20 MG/5ML PO SUSP: 30.0000 mL | ORAL | Status: DC | PRN

## 2021-07-15 MED ORDER — TRANEXAMIC ACID-NACL 1000-0.7 MG/100ML-% IV SOLN
1000.0000 mg | Freq: Once | INTRAVENOUS | Status: AC
  Administered 2021-07-15: 1000 mg via INTRAVENOUS
  Filled 2021-07-15: qty 100

## 2021-07-15 MED ORDER — ACETAMINOPHEN 500 MG PO TABS
1000.0000 mg | ORAL_TABLET | Freq: Once | ORAL | Status: AC
  Administered 2021-07-15: 1000 mg via ORAL
  Filled 2021-07-15: qty 2

## 2021-07-15 MED ORDER — HYDROMORPHONE HCL 1 MG/ML IJ SOLN
INTRAMUSCULAR | Status: AC
  Filled 2021-07-15: qty 1

## 2021-07-15 MED ORDER — SENNA-DOCUSATE SODIUM 8.6-50 MG PO TABS: 2.0000 | ORAL_TABLET | Freq: Every day | ORAL | 1 refills | Status: DC

## 2021-07-15 MED ORDER — ACETAMINOPHEN 500 MG PO TABS: 1000.0000 mg | ORAL_TABLET | Freq: Once | ORAL | Status: DC

## 2021-07-15 MED ORDER — MENTHOL 3 MG MT LOZG: 1.0000 | LOZENGE | OROMUCOSAL | Status: DC | PRN

## 2021-07-15 MED ORDER — ARTIFICIAL TEARS OPHTHALMIC OINT
TOPICAL_OINTMENT | Freq: Every day | OPHTHALMIC | Status: DC | PRN
  Filled 2021-07-15: qty 3.5

## 2021-07-15 MED ORDER — HYDROMORPHONE HCL 1 MG/ML IJ SOLN
0.5000 mg | INTRAMUSCULAR | Status: AC | PRN
  Administered 2021-07-15 (×2): 0.5 mg via INTRAVENOUS

## 2021-07-15 MED ORDER — PHENOL 1.4 % MT LIQD: 1.0000 | OROMUCOSAL | Status: DC | PRN

## 2021-07-15 MED ORDER — METHOCARBAMOL 500 MG IVPB - SIMPLE MED
INTRAVENOUS | Status: AC
  Administered 2021-07-15: 500 mg
  Filled 2021-07-15: qty 50

## 2021-07-15 MED ORDER — METHOCARBAMOL 500 MG PO TABS
500.0000 mg | ORAL_TABLET | Freq: Four times a day (QID) | ORAL | Status: DC | PRN
  Administered 2021-07-15 – 2021-07-16 (×3): 500 mg via ORAL
  Filled 2021-07-15 (×3): qty 1

## 2021-07-15 MED ORDER — LOSARTAN POTASSIUM 50 MG PO TABS
100.0000 mg | ORAL_TABLET | Freq: Every day | ORAL | Status: DC
  Administered 2021-07-16: 100 mg via ORAL
  Filled 2021-07-15: qty 2

## 2021-07-15 MED ORDER — ESMOLOL HCL 100 MG/10ML IV SOLN
INTRAVENOUS | Status: AC
  Filled 2021-07-15: qty 10

## 2021-07-15 MED ORDER — AMISULPRIDE (ANTIEMETIC) 5 MG/2ML IV SOLN: 10.0000 mg | Freq: Once | INTRAVENOUS | Status: DC | PRN

## 2021-07-15 MED ORDER — CEFAZOLIN SODIUM-DEXTROSE 2-4 GM/100ML-% IV SOLN
2.0000 g | Freq: Four times a day (QID) | INTRAVENOUS | Status: AC
  Administered 2021-07-15 (×2): 2 g via INTRAVENOUS
  Filled 2021-07-15 (×2): qty 100

## 2021-07-15 MED ORDER — ACETAMINOPHEN 500 MG PO TABS
1000.0000 mg | ORAL_TABLET | Freq: Four times a day (QID) | ORAL | Status: AC
  Administered 2021-07-15 – 2021-07-16 (×3): 1000 mg via ORAL
  Filled 2021-07-15 (×3): qty 2

## 2021-07-15 MED ORDER — BUPIVACAINE HCL (PF) 0.25 % IJ SOLN
INTRAMUSCULAR | Status: AC
  Filled 2021-07-15: qty 30

## 2021-07-15 MED ORDER — ONDANSETRON HCL 4 MG/2ML IJ SOLN
INTRAMUSCULAR | Status: DC | PRN
  Administered 2021-07-15: 4 mg via INTRAVENOUS

## 2021-07-15 MED ORDER — ROCURONIUM BROMIDE 10 MG/ML (PF) SYRINGE
PREFILLED_SYRINGE | INTRAVENOUS | Status: AC
  Filled 2021-07-15: qty 10

## 2021-07-15 MED ORDER — FENTANYL CITRATE PF 50 MCG/ML IJ SOSY
PREFILLED_SYRINGE | INTRAMUSCULAR | Status: AC
  Filled 2021-07-15: qty 3

## 2021-07-15 MED ORDER — HYDROMORPHONE HCL 1 MG/ML IJ SOLN: 0.5000 mg | INTRAMUSCULAR | Status: DC | PRN

## 2021-07-15 MED ORDER — KETOROLAC TROMETHAMINE 30 MG/ML IJ SOLN
INTRAMUSCULAR | Status: AC
  Filled 2021-07-15: qty 1

## 2021-07-15 MED ORDER — ROCURONIUM BROMIDE 10 MG/ML (PF) SYRINGE
PREFILLED_SYRINGE | INTRAVENOUS | Status: DC | PRN
Start: 2021-07-15 — End: 2021-07-15
  Administered 2021-07-15: 20 mg via INTRAVENOUS
  Administered 2021-07-15: 60 mg via INTRAVENOUS

## 2021-07-15 MED ORDER — METOCLOPRAMIDE HCL 5 MG PO TABS: 5.0000 mg | ORAL_TABLET | Freq: Three times a day (TID) | ORAL | Status: DC | PRN

## 2021-07-15 MED ORDER — RIVAROXABAN 10 MG PO TABS: 10.0000 mg | ORAL_TABLET | Freq: Every day | ORAL | 0 refills | Status: DC

## 2021-07-15 MED ORDER — DIPHENHYDRAMINE HCL 12.5 MG/5ML PO ELIX: 12.5000 mg | ORAL_SOLUTION | ORAL | Status: DC | PRN

## 2021-07-15 MED ORDER — POLYETHYLENE GLYCOL 3350 17 G PO PACK: 17.0000 g | PACK | Freq: Every day | ORAL | Status: DC | PRN

## 2021-07-15 MED ORDER — ACETAMINOPHEN 325 MG PO TABS
325.0000 mg | ORAL_TABLET | Freq: Four times a day (QID) | ORAL | Status: DC | PRN
  Administered 2021-07-16: 650 mg via ORAL
  Filled 2021-07-15: qty 2

## 2021-07-15 MED ORDER — SUGAMMADEX SODIUM 200 MG/2ML IV SOLN
INTRAVENOUS | Status: DC | PRN
  Administered 2021-07-15: 200 mg via INTRAVENOUS

## 2021-07-15 MED ORDER — OXYCODONE HCL 5 MG PO TABS: 5.0000 mg | ORAL_TABLET | ORAL | 0 refills | Status: DC | PRN

## 2021-07-15 MED ORDER — ESMOLOL HCL 100 MG/10ML IV SOLN
INTRAVENOUS | Status: DC | PRN
  Administered 2021-07-15 (×2): 10 mg via INTRAVENOUS

## 2021-07-15 MED ORDER — PROPOFOL 10 MG/ML IV BOLUS
INTRAVENOUS | Status: DC | PRN
  Administered 2021-07-15: 200 mg via INTRAVENOUS

## 2021-07-15 MED ORDER — OXYCODONE HCL 5 MG PO TABS: 5.0000 mg | ORAL_TABLET | ORAL | Status: DC | PRN

## 2021-07-15 MED ORDER — ONDANSETRON HCL 4 MG/2ML IJ SOLN: 4.0000 mg | Freq: Once | INTRAMUSCULAR | Status: DC | PRN

## 2021-07-15 MED ORDER — PROPOFOL 10 MG/ML IV BOLUS
INTRAVENOUS | Status: AC
  Filled 2021-07-15: qty 20

## 2021-07-15 MED ORDER — ORAL CARE MOUTH RINSE: 15.0000 mL | Freq: Once | OROMUCOSAL | Status: AC

## 2021-07-15 MED ORDER — ONDANSETRON HCL 4 MG/2ML IJ SOLN: 4.0000 mg | Freq: Four times a day (QID) | INTRAMUSCULAR | Status: DC | PRN

## 2021-07-15 MED ORDER — LIDOCAINE 2% (20 MG/ML) 5 ML SYRINGE
INTRAMUSCULAR | Status: DC | PRN
  Administered 2021-07-15: 40 mg via INTRAVENOUS

## 2021-07-15 MED ORDER — TRANEXAMIC ACID-NACL 1000-0.7 MG/100ML-% IV SOLN
1000.0000 mg | INTRAVENOUS | Status: AC
  Administered 2021-07-15: 1000 mg via INTRAVENOUS
  Filled 2021-07-15: qty 100

## 2021-07-15 MED ORDER — INFLUENZA VAC A&B SA ADJ QUAD 0.5 ML IM PRSY
0.5000 mL | PREFILLED_SYRINGE | INTRAMUSCULAR | Status: AC
  Administered 2021-07-16: 0.5 mL via INTRAMUSCULAR
  Filled 2021-07-15: qty 0.5

## 2021-07-15 MED ORDER — OXYCODONE HCL 5 MG PO TABS
10.0000 mg | ORAL_TABLET | ORAL | Status: DC | PRN
  Administered 2021-07-15 – 2021-07-16 (×4): 15 mg via ORAL
  Filled 2021-07-15 (×4): qty 3

## 2021-07-15 MED ORDER — FLEET ENEMA 7-19 GM/118ML RE ENEM: 1.0000 | ENEMA | Freq: Once | RECTAL | Status: DC | PRN

## 2021-07-15 MED ORDER — CEFAZOLIN SODIUM-DEXTROSE 2-4 GM/100ML-% IV SOLN
2.0000 g | INTRAVENOUS | Status: AC
  Administered 2021-07-15: 2 g via INTRAVENOUS
  Filled 2021-07-15: qty 100

## 2021-07-15 MED ORDER — METOCLOPRAMIDE HCL 5 MG/ML IJ SOLN: 5.0000 mg | Freq: Three times a day (TID) | INTRAMUSCULAR | Status: DC | PRN

## 2021-07-15 MED ORDER — SODIUM CHLORIDE 0.9 % IR SOLN
Status: DC | PRN
  Administered 2021-07-15: 3000 mL

## 2021-07-15 MED ORDER — BUPIVACAINE-EPINEPHRINE (PF) 0.5% -1:200000 IJ SOLN
INTRAMUSCULAR | Status: DC | PRN
  Administered 2021-07-15: 30 mL via PERINEURAL

## 2021-07-15 MED ORDER — FENTANYL CITRATE PF 50 MCG/ML IJ SOSY
25.0000 ug | PREFILLED_SYRINGE | INTRAMUSCULAR | Status: DC | PRN
  Administered 2021-07-15 (×3): 50 ug via INTRAVENOUS

## 2021-07-15 MED ORDER — POVIDONE-IODINE 10 % EX SWAB
2.0000 "application " | Freq: Once | CUTANEOUS | Status: AC
  Administered 2021-07-15: 2 via TOPICAL

## 2021-07-15 MED ORDER — RIVAROXABAN 10 MG PO TABS
10.0000 mg | ORAL_TABLET | Freq: Every day | ORAL | Status: DC
  Administered 2021-07-16: 10 mg via ORAL
  Filled 2021-07-15: qty 1

## 2021-07-15 MED ORDER — BUPIVACAINE HCL 0.25 % IJ SOLN
INTRAMUSCULAR | Status: DC | PRN
  Administered 2021-07-15: 30 mL

## 2021-07-15 MED ORDER — METHOCARBAMOL 500 MG IVPB - SIMPLE MED
500.0000 mg | Freq: Four times a day (QID) | INTRAVENOUS | Status: DC | PRN
  Filled 2021-07-15: qty 50

## 2021-07-15 MED ORDER — FENTANYL CITRATE (PF) 250 MCG/5ML IJ SOLN
INTRAMUSCULAR | Status: DC | PRN
  Administered 2021-07-15 (×4): 50 ug via INTRAVENOUS

## 2021-07-15 MED ORDER — HYDROCHLOROTHIAZIDE 12.5 MG PO CAPS
12.5000 mg | ORAL_CAPSULE | Freq: Every day | ORAL | Status: DC
  Administered 2021-07-16: 12.5 mg via ORAL
  Filled 2021-07-15: qty 1

## 2021-07-15 MED ORDER — MIDAZOLAM HCL 2 MG/2ML IJ SOLN
INTRAMUSCULAR | Status: AC
  Filled 2021-07-15: qty 2

## 2021-07-15 MED ORDER — LOSARTAN POTASSIUM-HCTZ 100-12.5 MG PO TABS: 1.0000 | ORAL_TABLET | Freq: Every day | ORAL | Status: DC

## 2021-07-15 MED ORDER — DEXAMETHASONE SODIUM PHOSPHATE 10 MG/ML IJ SOLN
INTRAMUSCULAR | Status: DC | PRN
  Administered 2021-07-15: 10 mg via INTRAVENOUS

## 2021-07-15 MED ORDER — WATER FOR IRRIGATION, STERILE IR SOLN
Status: DC | PRN
  Administered 2021-07-15: 2000 mL

## 2021-07-15 MED ORDER — DOCUSATE SODIUM 100 MG PO CAPS
100.0000 mg | ORAL_CAPSULE | Freq: Two times a day (BID) | ORAL | Status: DC
  Administered 2021-07-15 – 2021-07-16 (×3): 100 mg via ORAL
  Filled 2021-07-15 (×3): qty 1

## 2021-07-15 MED ORDER — KETOROLAC TROMETHAMINE 30 MG/ML IJ SOLN
INTRAMUSCULAR | Status: DC | PRN
  Administered 2021-07-15: 30 mg via INTRA_ARTICULAR

## 2021-07-15 MED ORDER — CHLORHEXIDINE GLUCONATE 0.12 % MT SOLN
15.0000 mL | Freq: Once | OROMUCOSAL | Status: AC
  Administered 2021-07-15: 15 mL via OROMUCOSAL

## 2021-07-15 MED ORDER — MIDAZOLAM HCL 5 MG/5ML IJ SOLN
INTRAMUSCULAR | Status: DC | PRN
Start: 2021-07-15 — End: 2021-07-15
  Administered 2021-07-15: 2 mg via INTRAVENOUS

## 2021-07-15 MED ORDER — 0.9 % SODIUM CHLORIDE (POUR BTL) OPTIME
TOPICAL | Status: DC | PRN
  Administered 2021-07-15: 1000 mL

## 2021-07-15 SURGICAL SUPPLY — 53 items
ATTUNE MED DOME PAT 38 KNEE (Knees) ×2 IMPLANT
ATTUNE PS FEM LT SZ 6 CEM KNEE (Femur) ×2 IMPLANT
BAG COUNTER SPONGE SURGICOUNT (BAG) IMPLANT
BAG ZIPLOCK 12X15 (MISCELLANEOUS) IMPLANT
BASEPLATE TIB CMT FB PCKT SZ6 (Knees) ×2 IMPLANT
BLADE SAG 18X100X1.27 (BLADE) ×2 IMPLANT
BLADE SAW SGTL 11.0X1.19X90.0M (BLADE) ×2 IMPLANT
BLADE SAW SGTL 13X75X1.27 (BLADE) ×2 IMPLANT
BLADE SURG 15 STRL LF DISP TIS (BLADE) ×1 IMPLANT
BLADE SURG 15 STRL SS (BLADE) ×2
BNDG ELASTIC 6X10 VLCR STRL LF (GAUZE/BANDAGES/DRESSINGS) ×2 IMPLANT
BOWL SMART MIX CTS (DISPOSABLE) ×2 IMPLANT
CEMENT HV SMART SET (Cement) ×6 IMPLANT
CLSR STERI-STRIP ANTIMIC 1/2X4 (GAUZE/BANDAGES/DRESSINGS) ×4 IMPLANT
COVER SURGICAL LIGHT HANDLE (MISCELLANEOUS) ×2 IMPLANT
CUFF TOURN SGL QUICK 34 (TOURNIQUET CUFF) ×2
CUFF TRNQT CYL 34X4.125X (TOURNIQUET CUFF) ×1 IMPLANT
DECANTER SPIKE VIAL GLASS SM (MISCELLANEOUS) IMPLANT
DRAPE SHEET LG 3/4 BI-LAMINATE (DRAPES) ×2 IMPLANT
DRAPE U-SHAPE 47X51 STRL (DRAPES) ×2 IMPLANT
DRSG MEPILEX BORDER 4X12 (GAUZE/BANDAGES/DRESSINGS) ×2 IMPLANT
DRSG PAD ABDOMINAL 8X10 ST (GAUZE/BANDAGES/DRESSINGS) ×2 IMPLANT
DURAPREP 26ML APPLICATOR (WOUND CARE) ×4 IMPLANT
ELECT REM PT RETURN 15FT ADLT (MISCELLANEOUS) ×2 IMPLANT
GLOVE SRG 8 PF TXTR STRL LF DI (GLOVE) ×1 IMPLANT
GLOVE SURG ENC MOIS LTX SZ6.5 (GLOVE) ×2 IMPLANT
GLOVE SURG ENC MOIS LTX SZ7.5 (GLOVE) ×2 IMPLANT
GLOVE SURG UNDER POLY LF SZ7 (GLOVE) ×2 IMPLANT
GLOVE SURG UNDER POLY LF SZ8 (GLOVE) ×2
GOWN STRL REUS W/ TWL LRG LVL3 (GOWN DISPOSABLE) ×2 IMPLANT
GOWN STRL REUS W/TWL LRG LVL3 (GOWN DISPOSABLE) ×4
HANDPIECE INTERPULSE COAX TIP (DISPOSABLE) ×2
HOLDER FOLEY CATH W/STRAP (MISCELLANEOUS) IMPLANT
HOOD PEEL AWAY FLYTE STAYCOOL (MISCELLANEOUS) ×6 IMPLANT
IMMOBILIZER KNEE 20 (SOFTGOODS) ×2
IMMOBILIZER KNEE 20 THIGH 36 (SOFTGOODS) ×1 IMPLANT
INSERT TIB PS FB ATTUNE SZ6X5 (Knees) ×2 IMPLANT
KIT TURNOVER KIT A (KITS) ×2 IMPLANT
MANIFOLD NEPTUNE II (INSTRUMENTS) ×2 IMPLANT
NS IRRIG 1000ML POUR BTL (IV SOLUTION) ×2 IMPLANT
PACK TOTAL KNEE CUSTOM (KITS) ×2 IMPLANT
PROTECTOR NERVE ULNAR (MISCELLANEOUS) ×2 IMPLANT
SET HNDPC FAN SPRY TIP SCT (DISPOSABLE) ×1 IMPLANT
SET PAD KNEE POSITIONER (MISCELLANEOUS) ×2 IMPLANT
SUT VIC AB 1 CT1 36 (SUTURE) ×4 IMPLANT
SUT VIC AB 2-0 CT1 27 (SUTURE) ×2
SUT VIC AB 2-0 CT1 TAPERPNT 27 (SUTURE) ×1 IMPLANT
SUT VIC AB 3-0 SH 8-18 (SUTURE) ×2 IMPLANT
TAPE STRIPS DRAPE STRL (GAUZE/BANDAGES/DRESSINGS) ×2 IMPLANT
TRAY FOLEY MTR SLVR 16FR STAT (SET/KITS/TRAYS/PACK) ×2 IMPLANT
TUBE SUCTION HIGH CAP CLEAR NV (SUCTIONS) ×2 IMPLANT
WATER STERILE IRR 1000ML POUR (IV SOLUTION) ×4 IMPLANT
WRAP KNEE MAXI GEL POST OP (GAUZE/BANDAGES/DRESSINGS) ×2 IMPLANT

## 2021-07-15 NOTE — Anesthesia Procedure Notes (Signed)
Anesthesia Regional Block: Adductor canal block   Pre-Anesthetic Checklist: , timeout performed,  Correct Patient, Correct Site, Correct Laterality,  Correct Procedure,, site marked,  Risks and benefits discussed,  Surgical consent,  Pre-op evaluation,  At surgeon's request and post-op pain management  Laterality: Left  Prep: chloraprep       Needles:  Injection technique: Single-shot  Needle Type: Echogenic Stimulator Needle     Needle Length: 9cm  Needle Gauge: 21     Additional Needles:   Procedures:,,,, ultrasound used (permanent image in chart),,    Narrative:  Start time: 07/15/2021 6:50 AM End time: 07/15/2021 7:00 AM Injection made incrementally with aspirations every 5 mL.  Performed by: Personally  Anesthesiologist: Murvin Natal, MD  Additional Notes: Functioning IV was confirmed and monitors were applied. A time-out was performed. Hand hygiene and sterile gloves were used. The thigh was placed in a frog-leg position and prepped in a sterile fashion. A 44mm 21ga Arrow echogenic stimulator needle was placed using ultrasound guidance.  Negative aspiration and negative test dose prior to incremental administration of local anesthetic. The patient tolerated the procedure well.

## 2021-07-15 NOTE — Anesthesia Procedure Notes (Addendum)
Procedure Name: Intubation Date/Time: 07/15/2021 7:41 AM Performed by: Myna Bright, CRNA Pre-anesthesia Checklist: Patient identified, Emergency Drugs available, Suction available and Patient being monitored Patient Re-evaluated:Patient Re-evaluated prior to induction Oxygen Delivery Method: Circle system utilized Preoxygenation: Pre-oxygenation with 100% oxygen Induction Type: IV induction Ventilation: Mask ventilation with difficulty and Oral airway inserted - appropriate to patient size Laryngoscope Size: Glidescope and 4 Tube size: 7.5 mm Number of attempts: 2 Airway Equipment and Method: Rigid stylet and Video-laryngoscopy Placement Confirmation: ETT inserted through vocal cords under direct vision, positive ETCO2 and breath sounds checked- equal and bilateral Secured at: 22 cm Tube secured with: Tape Dental Injury: Injury to lip  Difficulty Due To: Difficulty was anticipated, Difficult Airway- due to large tongue, Difficult Airway- due to reduced neck mobility, Difficult Airway- due to limited oral opening and Difficult Airway- due to anterior larynx Future Recommendations: Recommend- induction with short-acting agent, and alternative techniques readily available Comments: Smooth IV induction. Two provider mask with oral airway in place. DL with MAC 4, view of epiglottis only. Resumed mask ventilation. DL with Glidescope. Good view with Glide. ETT passed without difficulty. +EtCO2, +BBS. Tiny upper lip laceration noted, lacrilube applied.

## 2021-07-15 NOTE — Transfer of Care (Signed)
Immediate Anesthesia Transfer of Care Note  Patient: Jesse Mcclure.  Procedure(s) Performed: TOTAL KNEE ARTHROPLASTY (Left: Knee)  Patient Location: PACU  Anesthesia Type:GA combined with regional for post-op pain  Level of Consciousness: awake, alert , oriented and patient cooperative  Airway & Oxygen Therapy: Patient Spontanous Breathing and Patient connected to face mask oxygen  Post-op Assessment: Report given to RN, Post -op Vital signs reviewed and stable and Patient moving all extremities  Post vital signs: Reviewed and stable  Last Vitals:  Vitals Value Taken Time  BP 147/92 07/15/21 0951  Temp    Pulse 87 07/15/21 0956  Resp 16 07/15/21 0956  SpO2 99 % 07/15/21 0956  Vitals shown include unvalidated device data.  Last Pain:  Vitals:   07/15/21 0538  TempSrc: Oral         Complications: No notable events documented.

## 2021-07-15 NOTE — Op Note (Signed)
DATE OF SURGERY:  07/15/2021 TIME: 9:14 AM  PATIENT NAME:  Jesse Mcclure.   AGE: 70 y.o.    PRE-OPERATIVE DIAGNOSIS: Left knee primary localized osteoarthritis  POST-OPERATIVE DIAGNOSIS:  Same  PROCEDURE: Left total Knee Arthroplasty  SURGEON:  [name], MD   ASSISTANT:  Merlene Pulling, PA-C, present and scrubbed throughout the case, critical for assistance with exposure, retraction, instrumentation, and closure.   OPERATIVE IMPLANTS: Depuy Attune size 6 posterior Stabilized Femur, with a size 6 fixed Bearing Tibia, 5 polyethylene insert with a 38 medialized oval dome polyethylene patella.  PREOPERATIVE INDICATIONS:  Jesse Mcclure. is a 70 y.o. year old adult with end stage bone on bone degenerative arthritis of the knee who failed conservative treatment, including injections, antiinflammatories, activity modification, and assistive devices, and had significant impairment of their activities of daily living, and elected for Total Knee Arthroplasty.   The risks, benefits, and alternatives were discussed at length including but not limited to the risks of infection, bleeding, nerve injury, stiffness, blood clots, the need for revision surgery, cardiopulmonary complications, among others, and they were willing to proceed.  OPERATIVE FINDINGS AND UNIQUE ASPECTS OF THE CASE: His patella had advanced eburnated wear, and although it measured 21 before the cut, the lateral facet was extremely scalloped out, and I barely got to the depth of it, I did use a curette to abrade the surface to allow better cement fill of the scallop.  He had about a 10 degree flexion contracture before the case.  There was not a significant amount of anterior flange left due to his extreme erosive patellofemoral wear, but I did not notch.  ESTIMATED BLOOD LOSS: 100 mL  OPERATIVE DESCRIPTION:  The patient was brought to the operative room and placed in a supine position.  Anesthesia was administered.   IV antibiotics were given.  The lower extremity was prepped and draped in the usual sterile fashion.  Time out was performed.  The leg was elevated and exsanguinated and the tourniquet was inflated.  Anterior quadriceps tendon splitting approach was performed.  The patella was everted and osteophytes were removed.  The anterior horn of the medial and lateral meniscus was removed.   The patella was then measured, and cut with the saw.  The thickness before the cut was 21 and after the cut was 13.5.  A metal shield was used to protect the patella throughout the case.    The distal femur was opened with the drill and the intramedullary distal femoral cutting jig was utilized, set at 5 degrees resecting 10 mm off the distal femur.  Care was taken to protect the collateral ligaments.  Then the extramedullary tibial cutting jig was utilized making the appropriate cut using the anterior tibial crest as a reference building in appropriate posterior slope.  Care was taken during the cut to protect the medial and collateral ligaments.  The proximal tibia was removed along with the posterior horns of the menisci.  The PCL was sacrificed.    The extensor gap was measured and found to have adequate resection, measuring to a size 5.    The distal femoral sizing jig was applied, taking care to avoid notching.  This was set at 3 degrees of external rotation.  Then the 4-in-1 cutting jig was applied and the anterior and posterior femur was cut, along with the chamfer cuts.  All posterior osteophytes were removed.  The flexion gap was then measured and was symmetric with the  extension gap.  I completed the distal femoral preparation using the appropriate jig to prepare the box.  The proximal tibia sized and prepared accordingly with the reamer and the punch, and then all components were trialed with the poly insert.  The knee was found to have excellent balance and full motion.    The above named components were  then cemented into place and all excess cement was removed.  The real polyethylene implant was placed.  After the cement had cured I released the tourniquet and confirmed excellent hemostasis with no major posterior vessel injury.    The knee was easily taken through a range of motion and the patella tracked well and the knee irrigated copiously and the parapatellar and subcutaneous tissue closed with vicryl, and monocryl with steri strips for the skin.  The wounds were injected with marcaine, and dressed with sterile gauze and the patient was awakened and returned to the PACU in stable and satisfactory condition.  There were no complications.  Total tourniquet time was 67 minutes.

## 2021-07-15 NOTE — Progress Notes (Signed)
PT Cancellation Note  Patient Details Name: Jesse Mcclure. MRN: [id number] DOB: 1951/07/13   Cancelled Treatment:    Reason Eval/Treat Not Completed:  Attempted PT eval-pt awaiting pain meds. Pt stated pain level is near "10". Will attempt to check back if schedule allows.    [facility]  Office: [phone] Pager: [phone]

## 2021-07-15 NOTE — Interval H&P Note (Signed)
History and Physical Interval Note:  07/15/2021 7:19 AM  Jesse Mcclure.  has presented today for surgery, with the diagnosis of djd left knee.  The various methods of treatment have been discussed with the patient and family. After consideration of risks, benefits and other options for treatment, the patient has consented to  Procedure(s): TOTAL KNEE ARTHROPLASTY (Left) as a surgical intervention.  The patient's history has been reviewed, patient examined, no change in status, stable for surgery.  I have reviewed the patient's chart and labs.  Questions were answered to the patient's satisfaction.     [name]

## 2021-07-15 NOTE — Discharge Instructions (Addendum)
INSTRUCTIONS AFTER JOINT REPLACEMENT   Remove items at home which could result in a fall. This includes throw rugs or furniture in walking pathways ICE to the affected joint every three hours while awake for 30 minutes at a time, for at least the first 3-5 days, and then as needed for pain and swelling.  Continue to use ice for pain and swelling. You may notice swelling that will progress down to the foot and ankle.  This is normal after surgery.  Elevate your leg when you are not up walking on it.   Continue to use the breathing machine you got in the hospital (incentive spirometer) which will help keep your temperature down.  It is common for your temperature to cycle up and down following surgery, especially at night when you are not up moving around and exerting yourself.  The breathing machine keeps your lungs expanded and your temperature down.   DIET:  As you were doing prior to hospitalization, we recommend a well-balanced diet.  DRESSING / WOUND CARE / SHOWERING  You may shower 3 days after surgery, but keep the wounds dry during showering.  You may use an occlusive plastic wrap (Press'n Seal for example), NO SOAKING/SUBMERGING IN THE BATHTUB.  If the bandage gets wet, change with a clean dry gauze.  If the incision gets wet, pat the wound dry with a clean towel.  ACTIVITY  Increase activity slowly as tolerated, but follow the weight bearing instructions below.   No driving for 6 weeks or until further direction given by your physician.  You cannot drive while taking narcotics.  No lifting or carrying greater than 10 lbs. until further directed by your surgeon. Avoid periods of inactivity such as sitting longer than an hour when not asleep. This helps prevent blood clots.  You may return to work once you are authorized by your doctor.     WEIGHT BEARING   Weight bearing as tolerated with assist device (walker, cane, etc) as directed, use it as long as suggested by your surgeon or  therapist, typically at least 4-6 weeks.   EXERCISES  Results after joint replacement surgery are often greatly improved when you follow the exercise, range of motion and muscle strengthening exercises prescribed by your doctor. Safety measures are also important to protect the joint from further injury. Any time any of these exercises cause you to have increased pain or swelling, decrease what you are doing until you are comfortable again and then slowly increase them. If you have problems or questions, call your caregiver or physical therapist for advice.   Rehabilitation is important following a joint replacement. After just a few days of immobilization, the muscles of the leg can become weakened and shrink (atrophy).  These exercises are designed to build up the tone and strength of the thigh and leg muscles and to improve motion. Often times heat used for twenty to thirty minutes before working out will loosen up your tissues and help with improving the range of motion but do not use heat for the first two weeks following surgery (sometimes heat can increase post-operative swelling).   These exercises can be done on a training (exercise) mat, on the floor, on a table or on a bed. Use whatever works the best and is most comfortable for you.    Use music or television while you are exercising so that the exercises are a pleasant break in your day. This will make your life better with the exercises acting   as a break in your routine that you can look forward to.   Perform all exercises about fifteen times, three times per day or as directed.  You should exercise both the operative leg and the other leg as well.  Exercises include:   Quad Sets - Tighten up the muscle on the front of the thigh (Quad) and hold for 5-10 seconds.   Straight Leg Raises - With your knee straight (if you were given a brace, keep it on), lift the leg to 60 degrees, hold for 3 seconds, and slowly lower the leg.  Perform this  exercise against resistance later as your leg gets stronger.  Leg Slides: Lying on your back, slowly slide your foot toward your buttocks, bending your knee up off the floor (only go as far as is comfortable). Then slowly slide your foot back down until your leg is flat on the floor again.  Angel Wings: Lying on your back spread your legs to the side as far apart as you can without causing discomfort.  Hamstring Strength:  Lying on your back, push your heel against the floor with your leg straight by tightening up the muscles of your buttocks.  Repeat, but this time bend your knee to a comfortable angle, and push your heel against the floor.  You may put a pillow under the heel to make it more comfortable if necessary.   A rehabilitation program following joint replacement surgery can speed recovery and prevent re-injury in the future due to weakened muscles. Contact your doctor or a physical therapist for more information on knee rehabilitation.    CONSTIPATION  Constipation is defined medically as fewer than three stools per week and severe constipation as less than one stool per week.  Even if you have a regular bowel pattern at home, your normal regimen is likely to be disrupted due to multiple reasons following surgery.  Combination of anesthesia, postoperative narcotics, change in appetite and fluid intake all can affect your bowels.   YOU MUST use at least one of the following options; they are listed in order of increasing strength to get the job done.  They are all available over the counter, and you may need to use some, POSSIBLY even all of these options:    Drink plenty of fluids (prune juice may be helpful) and high fiber foods Colace 100 mg by mouth twice a day  Senokot for constipation as directed and as needed Dulcolax (bisacodyl), take with full glass of water  Miralax (polyethylene glycol) once or twice a day as needed.  If you have tried all these things and are unable to have a  bowel movement in the first 3-4 days after surgery call either your surgeon or your primary doctor.    If you experience loose stools or diarrhea, hold the medications until you stool forms back up.  If your symptoms do not get better within 1 week or if they get worse, check with your doctor.  If you experience "the worst abdominal pain ever" or develop nausea or vomiting, please contact the office immediately for further recommendations for treatment.   ITCHING:  If you experience itching with your medications, try taking only a single pain pill, or even half a pain pill at a time.  You can also use Benadryl over the counter for itching or also to help with sleep.   TED HOSE STOCKINGS:  Use stockings on both legs until for at least 2 weeks or as directed by  physician office. They may be removed at night for sleeping.  MEDICATIONS:  See your medication summary on the "After Visit Summary" that nursing will review with you.  You may have some home medications which will be placed on hold until you complete the course of blood thinner medication.  It is important for you to complete the blood thinner medication as prescribed.  PRECAUTIONS:  If you experience chest pain or shortness of breath - call 911 immediately for transfer to the hospital emergency department.   If you develop a fever greater that 101 F, purulent drainage from wound, increased redness or drainage from wound, foul odor from the wound/dressing, or calf pain - CONTACT YOUR SURGEON.                                                   FOLLOW-UP APPOINTMENTS:  If you do not already have a post-op appointment, please call the office for an appointment to be seen by your surgeon.  Guidelines for how soon to be seen are listed in your "After Visit Summary", but are typically between 1-4 weeks after surgery.  OTHER INSTRUCTIONS:   Knee Replacement:  Do not place pillow under knee, focus on keeping the knee straight while resting.    POST-OPERATIVE OPIOID TAPER INSTRUCTIONS: It is important to wean off of your opioid medication as soon as possible. If you do not need pain medication after your surgery it is ok to stop day one. Opioids include: Codeine, Hydrocodone(Norco, Vicodin), Oxycodone(Percocet, oxycontin) and hydromorphone amongst others.  Long term and even short term use of opiods can cause: Increased pain response Dependence Constipation Depression Respiratory depression And more.  Withdrawal symptoms can include Flu like symptoms Nausea, vomiting And more Techniques to manage these symptoms Hydrate well Eat regular healthy meals Stay active Use relaxation techniques(deep breathing, meditating, yoga) Do Not substitute Alcohol to help with tapering If you have been on opioids for less than two weeks and do not have pain than it is ok to stop all together.  Plan to wean off of opioids This plan should start within one week post op of your joint replacement. Maintain the same interval or time between taking each dose and first decrease the dose.  Cut the total daily intake of opioids by one tablet each day Next start to increase the time between doses. The last dose that should be eliminated is the evening dose.   MAKE SURE YOU:  Understand these instructions.  Get help right away if you are not doing well or get worse.    Thank you for letting us be a part of your medical care team.  It is a privilege we respect greatly.  We hope these instructions will help you stay on track for a fast and full recovery!    Information on my medicine - XARELTO (Rivaroxaban)  This medication education was reviewed with me or my healthcare representative as part of my discharge preparation.  The pharmacist that spoke with me during my hospital stay was:  Eaden Hettinger A, RPH  Why was Xarelto prescribed for you? Xarelto was prescribed for you to reduce the risk of blood clots forming after orthopedic surgery.  The medical term for these abnormal blood clots is venous thromboembolism (VTE).  What do you need to know about xarelto ? Take your Xarelto ONCE  DAILY at the same time every day. You may take it either with or without food.  If you have difficulty swallowing the tablet whole, you may crush it and mix in applesauce just prior to taking your dose.  Take Xarelto exactly as prescribed by your doctor and DO NOT stop taking Xarelto without talking to the doctor who prescribed the medication.  Stopping without other VTE prevention medication to take the place of Xarelto may increase your risk of developing a clot.  After discharge, you should have regular check-up appointments with your healthcare provider that is prescribing your Xarelto.    What do you do if you miss a dose? If you miss a dose, take it as soon as you remember on the same day then continue your regularly scheduled once daily regimen the next day. Do not take two doses of Xarelto on the same day.   Important Safety Information A possible side effect of Xarelto is bleeding. You should call your healthcare provider right away if you experience any of the following: Bleeding from an injury or your nose that does not stop. Unusual colored urine (red or dark brown) or unusual colored stools (red or black). Unusual bruising for unknown reasons. A serious fall or if you hit your head (even if there is no bleeding).  Some medicines may interact with Xarelto and might increase your risk of bleeding while on Xarelto. To help avoid this, consult your healthcare provider or pharmacist prior to using any new prescription or non-prescription medications, including herbals, vitamins, non-steroidal anti-inflammatory drugs (NSAIDs) and supplements.  This website has more information on Xarelto: [email or web address]

## 2021-07-15 NOTE — Anesthesia Postprocedure Evaluation (Signed)
Anesthesia Post Note  Patient: Jesse Mcclure.  Procedure(s) Performed: TOTAL KNEE ARTHROPLASTY (Left: Knee)     Patient location during evaluation: PACU Anesthesia Type: Regional and General Level of consciousness: awake Pain management: pain level controlled Vital Signs Assessment: post-procedure vital signs reviewed and stable Respiratory status: spontaneous breathing, nonlabored ventilation, respiratory function stable and patient connected to nasal cannula oxygen Cardiovascular status: blood pressure returned to baseline and stable Postop Assessment: no apparent nausea or vomiting Anesthetic complications: no   No notable events documented.  Last Vitals:  Vitals:   07/15/21 1300 07/15/21 1329  BP: (!) 170/106 (!) 160/103  Pulse: 90 99  Resp: 11 16  Temp:  37.4 C  SpO2: 99% 93%    Last Pain:  Vitals:   07/15/21 1745  TempSrc:   PainSc: 5                  Dessie Tatem P Briyanna Billingham

## 2021-07-16 ENCOUNTER — Other Ambulatory Visit: Payer: Self-pay | Admitting: *Deleted

## 2021-07-16 DIAGNOSIS — Z8612 Personal history of poliomyelitis: Secondary | ICD-10-CM | POA: Diagnosis not present

## 2021-07-16 DIAGNOSIS — I10 Essential (primary) hypertension: Secondary | ICD-10-CM | POA: Diagnosis not present

## 2021-07-16 DIAGNOSIS — C9000 Multiple myeloma not having achieved remission: Secondary | ICD-10-CM

## 2021-07-16 DIAGNOSIS — E669 Obesity, unspecified: Secondary | ICD-10-CM | POA: Diagnosis not present

## 2021-07-16 DIAGNOSIS — E785 Hyperlipidemia, unspecified: Secondary | ICD-10-CM | POA: Diagnosis not present

## 2021-07-16 DIAGNOSIS — M1712 Unilateral primary osteoarthritis, left knee: Secondary | ICD-10-CM | POA: Diagnosis not present

## 2021-07-16 DIAGNOSIS — Z23 Encounter for immunization: Secondary | ICD-10-CM | POA: Diagnosis not present

## 2021-07-16 LAB — CBC
HCT: 34 % — ABNORMAL LOW (ref 39.0–52.0)
Hemoglobin: 11.5 g/dL — ABNORMAL LOW (ref 13.0–17.0)
MCH: 37.7 pg — ABNORMAL HIGH (ref 26.0–34.0)
MCHC: 33.8 g/dL (ref 30.0–36.0)
MCV: 111.5 fL — ABNORMAL HIGH (ref 80.0–100.0)
Platelets: 126 10*3/uL — ABNORMAL LOW (ref 150–400)
RBC: 3.05 MIL/uL — ABNORMAL LOW (ref 4.22–5.81)
RDW: 13.5 % (ref 11.5–15.5)
WBC: 4 10*3/uL (ref 4.0–10.5)
nRBC: 0 % (ref 0.0–0.2)

## 2021-07-16 LAB — BASIC METABOLIC PANEL
Anion gap: 4 — ABNORMAL LOW (ref 5–15)
BUN: 17 mg/dL (ref 8–23)
CO2: 28 mmol/L (ref 22–32)
Calcium: 7.6 mg/dL — ABNORMAL LOW (ref 8.9–10.3)
Chloride: 104 mmol/L (ref 98–111)
Creatinine, Ser: 0.76 mg/dL (ref 0.61–1.24)
GFR, Estimated: >60 mL/min (ref >60)
Glucose, Bld: 151 mg/dL — ABNORMAL HIGH (ref 70–99)
Potassium: 4.1 mmol/L (ref 3.5–5.1)
Sodium: 136 mmol/L (ref 135–145)

## 2021-07-16 MED ORDER — LENALIDOMIDE 15 MG PO CAPS: 15.0000 mg | ORAL_CAPSULE | Freq: Every day | ORAL | 0 refills | Status: DC

## 2021-07-16 NOTE — TOC Transition Note (Signed)
Transition of Care [facility]) - CM/SW Discharge Note   Patient Details  Name: Jesse Mcclure. MRN: [id number] Date of Birth: Dec 17, 1950  Transition of Care [facility] CM/SW Contact:  Lennart Pall, LCSW Phone Number: 07/16/2021, 1:29 PM   Clinical Narrative:     Met with pt and confirming he has all needed DME at home.  Plan for OPPT at ortho MD practice.  No further TOC needs.  Final next level of care: OP Rehab Barriers to Discharge: No Barriers Identified   Patient Goals and CMS Choice Patient states their goals for this hospitalization and ongoing recovery are:: return home      Discharge Placement                       Discharge Plan and Services                DME Arranged: N/A DME Agency: NA                  Social Determinants of Health (SDOH) Interventions     Readmission Risk Interventions No flowsheet data found.

## 2021-07-16 NOTE — Progress Notes (Signed)
     Subjective: 1 Day Post-Op s/p Procedure(s): TOTAL KNEE ARTHROPLASTY   Patient is alert, oriented, yes  Patient reports pain as moderate.   Denies chest pain, SOB, Calf pain. No nausea/vomiting. He is eating well and voiding. No other complaints.   Objective:  PE: VITALS:   Vitals:   07/15/21 1329 07/15/21 2122 07/16/21 0622 07/16/21 0651  BP: (!) 160/103 (!) 137/91 122/89   Pulse: 99 85 77   Resp: 16 18 20    Temp: 99.4 F (37.4 C) 98.3 F (36.8 C)  98.2 F (36.8 C)  TempSrc: Oral Oral  Oral  SpO2: 93% 97% 98%   Weight: 108 kg     Height: 5' 7.5" (1.715 m)       ABD soft Sensation intact distally Intact pulses distally Dorsiflexion/Plantar flexion intact Incision: scant drainage Compartment soft  LABS  Results for orders placed or performed during the hospital encounter of 07/15/21 (from the past 24 hour(s))  CBC     Status: Abnormal   Collection Time: 07/16/21  3:21 AM  Result Value Ref Range   WBC 4.0 4.0 - 10.5 K/uL   RBC 3.05 (L) 4.22 - 5.81 MIL/uL   Hemoglobin 11.5 (L) 13.0 - 17.0 g/dL   HCT 34.0 (L) 39.0 - 52.0 %   MCV 111.5 (H) 80.0 - 100.0 fL   MCH 37.7 (H) 26.0 - 34.0 pg   MCHC 33.8 30.0 - 36.0 g/dL   RDW 13.5 11.5 - 15.5 %   Platelets 126 (L) 150 - 400 K/uL   nRBC 0.0 0.0 - 0.2 %  Basic metabolic panel     Status: Abnormal   Collection Time: 07/16/21  3:21 AM  Result Value Ref Range   Sodium 136 135 - 145 mmol/L   Potassium 4.1 3.5 - 5.1 mmol/L   Chloride 104 98 - 111 mmol/L   CO2 28 22 - 32 mmol/L   Glucose, Bld 151 (H) 70 - 99 mg/dL   BUN 17 8 - 23 mg/dL   Creatinine, Ser 0.76 0.61 - 1.24 mg/dL   Calcium 7.6 (L) 8.9 - 10.3 mg/dL   GFR, Estimated >60 >60 mL/min   Anion gap 4 (L) 5 - 15    DG Knee Left Port  Result Date: 07/15/2021 CLINICAL DATA:  Status post total left knee replacement EXAM: PORTABLE LEFT KNEE - 1-2 VIEW COMPARISON:  06/29/2018 FINDINGS: Status post interval total left knee arthroplasty. The prosthesis is in  anatomic alignment. No perihardware lucency. No acute fracture. Air within the soft tissues and joint space are not unexpected postoperatively. IMPRESSION: Expected postoperative appearance of a left total knee arthroplasty. Electronically Signed   By: Merilyn Baba M.D.   On: 07/15/2021 12:43    Assessment/Plan: Principal Problem:   Osteoarthritis of left knee Active Problems:   S/P TKR (total knee replacement), left  1 Day Post-Op s/p Procedure(s): TOTAL KNEE ARTHROPLASTY  Weightbearing: WBAT LLE, up with therapy Insicional and dressing care: Reinforce dressings as needed, new mepilex placed this morning VTE prophylaxis: Xarelto x 30 days Pain control: continue current regimen Follow - up plan: 2 weeks with Dr. Mardelle Matte Dispo: likely this afternoon, pending PT eval   Contact information:   Weekdays 8-5 Merlene Pulling, PA-C [phone] A fter hours and holidays please check [email or web address] for group call information for [facility]  Ventura Bruns 07/16/2021, 9:05 AM

## 2021-07-16 NOTE — Progress Notes (Signed)
Patient discharged to home w/ spouse. Given all belongings, instructions, equipment. Verbalized understanding of instructions. Escorted to pov via w/c.

## 2021-07-16 NOTE — Evaluation (Signed)
Physical Therapy Evaluation Patient Details Name: Jesse Mcclure. MRN: [id number] DOB: [birth year] Today's Date: 07/16/2021  History of Present Illness  70 yo male s/p L TKA 07/15/21. Hx of polio  Clinical Impression  On eval, pt required Min A for mobility. He walked ~40 feet with a RW. Moderate pain with activity. 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 Anticipating pt will be able to d/c home later today if he continues to do well.        Recommendations for follow up therapy are one component of a multi-disciplinary discharge planning process, led by the attending physician.  Recommendations may be updated based on patient status, additional functional criteria and insurance authorization.  Follow Up Recommendations Follow surgeon's recommendation for DC plan and follow-up therapies    Equipment Recommendations  None recommended by PT    Recommendations for Other Services       Precautions / Restrictions Precautions Precautions: Fall;Knee Required Braces or Orthoses: Knee Immobilizer - Left Restrictions Weight Bearing Restrictions: No Other Position/Activity Restrictions: WBAT      Mobility  Bed Mobility Overal bed mobility: Needs Assistance Bed Mobility: Supine to Sit     Supine to sit: Min assist;HOB elevated     General bed mobility comments: Assist for L LE. Increased time.    Transfers Overall transfer level: Needs assistance Equipment used: Rolling walker (2 wheeled) Transfers: Sit to/from Stand Sit to Stand: Min assist;From elevated surface         General transfer comment: Assist to rise, steady, control descent. Cues for safety, technique, hand/LE placement. Shaky.  Ambulation/Gait Ambulation/Gait assistance: Min assist Gait Distance (Feet): 40 Feet Assistive device: Rolling walker (2 wheeled) Gait Pattern/deviations: Step-to pattern     General Gait Details: Cues for safety, technique, sequence. Assist to steady.  Shaky. Fatigues fairly easily. Distance limited by pain as well.  Stairs            Wheelchair Mobility    Modified Rankin (Stroke Patients Only)       Balance Overall balance assessment: Needs assistance         Standing balance support: Bilateral upper extremity supported Standing balance-Leahy Scale: Poor                               Pertinent Vitals/Pain Pain Assessment: 0-10 Pain Score: 7  Pain Location: L knee Pain Descriptors / Indicators: Discomfort;Sore Pain Intervention(s): Limited activity within patient's tolerance;Monitored during session;Ice applied;Repositioned    Home Living Family/patient expects to be discharged to:: Private residence Living Arrangements: Spouse/significant other Available Help at Discharge: Family Type of Home: Apartment Home Access: Stairs to enter Entrance Stairs-Rails: Left Entrance Stairs-Number of Steps: 3 Home Layout: Able to live on main level with bedroom/bathroom Home Equipment: Gilford Rile - 2 wheels      Prior Function Level of Independence: Independent               Hand Dominance        Extremity/Trunk Assessment   Upper Extremity Assessment Upper Extremity Assessment: Overall WFL for tasks assessed    Lower Extremity Assessment Lower Extremity Assessment: Generalized weakness    Cervical / Trunk Assessment Cervical / Trunk Assessment: Normal  Communication   Communication: No difficulties  Cognition Arousal/Alertness: Awake/alert Behavior During Therapy: WFL for tasks assessed/performed Overall Cognitive Status: Within Functional Limits for tasks assessed  General Comments      Exercises Total Joint Exercises Ankle Circles/Pumps: AROM;Both;10 reps Quad Sets: AROM;Both;10 reps Heel Slides: AAROM;10 reps Hip ABduction/ADduction: AAROM;Left;10 reps Straight Leg Raises: AAROM;Left;10 reps Goniometric ROM: ~10-60 degrees    Assessment/Plan    PT Assessment Patient needs continued PT services  PT Problem List Decreased strength;Decreased range of motion;Decreased mobility;Decreased activity tolerance;Decreased balance;Pain       PT Treatment Interventions DME instruction;Therapeutic exercise;Gait training;Balance training;Stair training;Functional mobility training;Therapeutic activities;Patient/family education    PT Goals (Current goals can be found in the Care Plan section)  Acute Rehab PT Goals Patient Stated Goal: regain PLOF/independence PT Goal Formulation: With patient Time For Goal Achievement: 07/30/21 Potential to Achieve Goals: Good    Frequency 7X/week   Barriers to discharge        Co-evaluation               AM-PAC PT "6 Clicks" Mobility  Outcome Measure Help needed turning from your back to your side while in a flat bed without using bedrails?: A Little Help needed moving from lying on your back to sitting on the side of a flat bed without using bedrails?: A Little Help needed moving to and from a bed to a chair (including a wheelchair)?: A Little Help needed standing up from a chair using your arms (e.g., wheelchair or bedside chair)?: A Little Help needed to walk in hospital room?: A Little Help needed climbing 3-5 steps with a railing? : A Lot 6 Click Score: 17    End of Session Equipment Utilized During Treatment: Gait belt;Left knee immobilizer Activity Tolerance: Patient tolerated treatment well Patient left: in chair;with call bell/phone within reach   PT Visit Diagnosis: Other abnormalities of gait and mobility (R26.89);Pain Pain - Right/Left: Left Pain - part of body: Knee    Time: 2574-9355 PT Time Calculation (min) (ACUTE ONLY): 24 min   Charges:   PT Evaluation $PT Eval Low Complexity: 1 Low PT Treatments $Gait Training: 8-22 mins          Doreatha Massed, PT Acute Rehabilitation  Office: [phone] Pager: [phone]

## 2021-07-16 NOTE — Discharge Summary (Signed)
Discharge Summary  Patient ID: Jesse Mcclure. MRN: [id number] DOB/AGE: [birth year] 70 y.o.  Admit date: 07/15/2021 Discharge date: 07/16/2021  Admission Diagnoses:  Osteoarthritis of left knee  Discharge Diagnoses:  Principal Problem:   Osteoarthritis of left knee Active Problems:   S/P TKR (total knee replacement), left   Past Medical History:  Diagnosis Date   BPH (benign prostatic hypertrophy)    Degenerative disc disease    Diverticulosis    Hemochromatosis    HTN (hypertension)    Hyperlipidemia    Multiple myeloma (HCC)    Polio    Status post Nissen fundoplication (without gastrostomy tube) procedure    for hiatal hernia.    Surgeries: Procedure(s): TOTAL KNEE ARTHROPLASTY on 07/15/2021   Consultants (if any):   Discharged Condition: Improved  Hospital Course: Merick Kelleher. is an 70 y.o. adult who was admitted 07/15/2021 with a diagnosis of Osteoarthritis of left knee and went to the operating room on 07/15/2021 and underwent the above named procedures.    He was given perioperative antibiotics:  Anti-infectives (From admission, onward)    Start     Dose/Rate Route Frequency Ordered Stop   07/15/21 1400  ceFAZolin (ANCEF) IVPB 2g/100 mL premix        2 g 200 mL/hr over 30 Minutes Intravenous Every 6 hours 07/15/21 1336 07/15/21 2116   07/15/21 0600  ceFAZolin (ANCEF) IVPB 2g/100 mL premix        2 g 200 mL/hr over 30 Minutes Intravenous On call to O.R. 07/15/21 [id number] 07/15/21 0747     .  He was given sequential compression devices, early ambulation, and Xarelto for DVT prophylaxis.  He benefited maximally from the hospital stay and there were no complications.    Recent vital signs:  Vitals:   07/16/21 1005 07/16/21 1357  BP: 131/90 (!) 145/92  Pulse: 85 (!) 101  Resp: 18 17  Temp: (!) 97.5 F (36.4 C) 97.6 F (36.4 C)  SpO2: 94% 96%    Recent laboratory studies:  Lab Results  Component Value Date   HGB 11.5 (L) 07/16/2021    HGB 14.8 07/09/2021   HGB 13.3 03/31/2021   Lab Results  Component Value Date   WBC 4.0 07/16/2021   PLT 126 (L) 07/16/2021   Lab Results  Component Value Date   INR 1.0 04/14/2019   Lab Results  Component Value Date   NA 136 07/16/2021   K 4.1 07/16/2021   CL 104 07/16/2021   CO2 28 07/16/2021   BUN 17 07/16/2021   CREATININE 0.76 07/16/2021   GLUCOSE 151 (H) 07/16/2021    Discharge Medications:   Allergies as of 07/16/2021   No Known Allergies      Medication List     STOP taking these medications    aspirin 325 MG tablet   HYDROcodone-acetaminophen 5-325 MG tablet Commonly known as: NORCO/VICODIN       TAKE these medications    acetaminophen 650 MG CR tablet Commonly known as: TYLENOL Take 650 mg by mouth every 8 (eight) hours as needed for pain.   acyclovir 400 MG tablet Commonly known as: ZOVIRAX Take 1 tablet (400 mg total) by mouth 2 (two) times daily.   calcium carbonate 500 MG chewable tablet Commonly known as: TUMS - dosed in mg elemental calcium Chew 1-2 tablets by mouth daily as needed for heartburn.   folic acid 017 MCG tablet Commonly known as: FOLVITE Take 400 mcg by mouth daily.   lenalidomide  15 MG capsule Commonly known as: REVLIMID Take 1 capsule (15 mg total) by mouth daily. Take 1 capsule for 21 days, then stop for 7 days for a cycle of every 28 days   losartan-hydrochlorothiazide 100-12.5 MG tablet Commonly known as: HYZAAR Take 1 tablet by mouth daily.   Multi-Vitamin tablet Take by mouth.   oxyCODONE 5 MG immediate release tablet Commonly known as: Roxicodone Take 1 tablet (5 mg total) by mouth every 4 (four) hours as needed for severe pain.   rivaroxaban 10 MG Tabs tablet Commonly known as: Xarelto Take 1 tablet (10 mg total) by mouth daily.   sennosides-docusate sodium 8.6-50 MG tablet Commonly known as: SENOKOT-S Take 2 tablets by mouth daily.   SYSTANE OP Place 1 drop into both eyes daily as needed (dry  eyes).   vitamin B-12 1000 MCG tablet Commonly known as: CYANOCOBALAMIN Take 1,000 mcg by mouth daily.   Vitamin D 50 MCG (2000 UT) tablet Take 2,000 Units by mouth daily.        Diagnostic Studies: DG Knee Left Port  Result Date: 07/15/2021 CLINICAL DATA:  Status post total left knee replacement EXAM: PORTABLE LEFT KNEE - 1-2 VIEW COMPARISON:  06/29/2018 FINDINGS: Status post interval total left knee arthroplasty. The prosthesis is in anatomic alignment. No perihardware lucency. No acute fracture. Air within the soft tissues and joint space are not unexpected postoperatively. IMPRESSION: Expected postoperative appearance of a left total knee arthroplasty. Electronically Signed   By: Merilyn Baba M.D.   On: 07/15/2021 12:43    Disposition: Discharge disposition: 01-Home or Self Care          Follow-up Information     Marchia Bond, MD. Schedule an appointment as soon as possible for a visit in 2 week(s).   Specialty: Orthopedic Surgery Contact information: [address] [place] [address] [phone]                  Signed: Jola Baptist 07/16/2021, 2:06 PM

## 2021-07-18 DIAGNOSIS — M1712 Unilateral primary osteoarthritis, left knee: Secondary | ICD-10-CM | POA: Diagnosis not present

## 2021-07-18 DIAGNOSIS — M25662 Stiffness of left knee, not elsewhere classified: Secondary | ICD-10-CM | POA: Diagnosis not present

## 2021-07-18 DIAGNOSIS — M25462 Effusion, left knee: Secondary | ICD-10-CM | POA: Diagnosis not present

## 2021-07-18 DIAGNOSIS — M6281 Muscle weakness (generalized): Secondary | ICD-10-CM | POA: Diagnosis not present

## 2021-07-19 ENCOUNTER — Encounter (HOSPITAL_COMMUNITY): Payer: Self-pay | Admitting: Orthopedic Surgery

## 2021-07-21 ENCOUNTER — Other Ambulatory Visit: Payer: Medicare Other

## 2021-07-21 ENCOUNTER — Ambulatory Visit: Payer: Medicare Other | Admitting: Hematology and Oncology

## 2021-07-21 DIAGNOSIS — M1712 Unilateral primary osteoarthritis, left knee: Secondary | ICD-10-CM | POA: Diagnosis not present

## 2021-07-21 NOTE — Progress Notes (Addendum)
Physical Therapy Treatment Patient Details Name: Jesse Mcclure. MRN: [id number] DOB: May 21, 1951 Today's Date: 07/21/2021   History of Present Illness 70 yo male s/p L TKA 07/15/21. Hx of polio    PT Comments    2nd session to continue gait training and to practice stair negotiation. Spouse present to observe. All education completed. Okay to d/c from PT standpoint.     Recommendations for follow up therapy are one component of a multi-disciplinary discharge planning process, led by the attending physician.  Recommendations may be updated based on patient status, additional functional criteria and insurance authorization.  Follow Up Recommendations  Follow surgeon's recommendation for DC plan and follow-up therapies     Equipment Recommendations  None recommended by PT    Recommendations for Other Services       Precautions / Restrictions Precautions Precautions: Fall;Knee Restrictions Weight Bearing Restrictions: No Other Position/Activity Restrictions: WBAT     Mobility  Bed Mobility               General bed mobility comments: oob in recliner    Transfers Overall transfer level: Needs assistance Equipment used: Rolling walker (2 wheeled) Transfers: Sit to/from Stand Sit to Stand: Min guard         General transfer comment: Min guard for safety. Cues for safety, technique, hand placement. Shaky  Ambulation/Gait Ambulation/Gait assistance: Min guard Gait Distance (Feet): 25 Feet (x2) Assistive device: Rolling walker (2 wheeled) Gait Pattern/deviations: Step-to pattern     General Gait Details: Cues for safety, technique, sequence. Assist to steady. Shaky. Took 1 seated rest break before practicing stairs a 2nd time   Stairs Stairs: Yes Stairs assistance: Min guard Stair Management: Step to pattern;Forwards;Two rails;One rail Left Number of Stairs: 2 General stair comments: up and over portable stairs x 2. once using 2 rails, once using 2  hands on 1 rail. seated rest break between walks.   Wheelchair Mobility    Modified Rankin (Stroke Patients Only)       Balance Overall balance assessment: Mild deficits observed, not formally tested         Standing balance support: Bilateral upper extremity supported Standing balance-Leahy Scale: Fair                              Cognition Arousal/Alertness: Awake/alert Behavior During Therapy: WFL for tasks assessed/performed Overall Cognitive Status: Within Functional Limits for tasks assessed                                        Exercises      General Comments        Pertinent Vitals/Pain Pain Assessment: 0-10 Pain Score: 7  Pain Location: L knee Pain Intervention(s): Limited activity within patient's tolerance    Home Living                      Prior Function            PT Goals (current goals can now be found in the care plan section) Progress towards PT goals: Progressing toward goals    Frequency    7X/week      PT Plan Current plan remains appropriate    Co-evaluation              AM-PAC PT "6 Clicks" Mobility   Outcome  Measure  Help needed turning from your back to your side while in a flat bed without using bedrails?: A Little Help needed moving from lying on your back to sitting on the side of a flat bed without using bedrails?: A Little Help needed moving to and from a bed to a chair (including a wheelchair)?: A Little Help needed standing up from a chair using your arms (e.g., wheelchair or bedside chair)?: A Little Help needed to walk in hospital room?: A Little Help needed climbing 3-5 steps with a railing? : A Little 6 Click Score: 18    End of Session Equipment Utilized During Treatment: Gait belt Activity Tolerance: Patient tolerated treatment well Patient left: in chair;with call bell/phone within reach;with family/visitor present   PT Visit Diagnosis: Other abnormalities of  gait and mobility (R26.89) Pain - Right/Left: Left Pain - part of body: Knee     Time:  0076- 1443    Charges:  Gait Training: 23-37 mins                          Doreatha Massed, PT Acute Rehabilitation  Office: [phone] Pager: [phone]

## 2021-07-23 ENCOUNTER — Telehealth: Payer: Self-pay

## 2021-07-23 DIAGNOSIS — M6281 Muscle weakness (generalized): Secondary | ICD-10-CM | POA: Diagnosis not present

## 2021-07-23 DIAGNOSIS — M25662 Stiffness of left knee, not elsewhere classified: Secondary | ICD-10-CM | POA: Diagnosis not present

## 2021-07-23 DIAGNOSIS — M25462 Effusion, left knee: Secondary | ICD-10-CM | POA: Diagnosis not present

## 2021-07-23 DIAGNOSIS — R262 Difficulty in walking, not elsewhere classified: Secondary | ICD-10-CM | POA: Diagnosis not present

## 2021-07-23 NOTE — Telephone Encounter (Signed)
Pt called and states he had knee replacement surgery 10/11. Would like to know if it is OK for him to start back on N23, Vit D, folic acid, Xarelto; knows he can not restart Revlimid yet but needs to be advised about other meds. Message sent to MD. Pt aware and verbalized thanks.

## 2021-07-24 ENCOUNTER — Encounter: Payer: Self-pay | Admitting: Hematology and Oncology

## 2021-07-24 NOTE — Telephone Encounter (Signed)
Yes, he may restart everything except revlimid

## 2021-07-24 NOTE — Telephone Encounter (Signed)
Called and given below message. He verbalized understanding. 

## 2021-07-25 DIAGNOSIS — M25662 Stiffness of left knee, not elsewhere classified: Secondary | ICD-10-CM | POA: Diagnosis not present

## 2021-07-25 DIAGNOSIS — R262 Difficulty in walking, not elsewhere classified: Secondary | ICD-10-CM | POA: Diagnosis not present

## 2021-07-25 DIAGNOSIS — M25462 Effusion, left knee: Secondary | ICD-10-CM | POA: Diagnosis not present

## 2021-07-25 DIAGNOSIS — M6281 Muscle weakness (generalized): Secondary | ICD-10-CM | POA: Diagnosis not present

## 2021-07-28 DIAGNOSIS — M1712 Unilateral primary osteoarthritis, left knee: Secondary | ICD-10-CM | POA: Diagnosis not present

## 2021-07-30 DIAGNOSIS — R262 Difficulty in walking, not elsewhere classified: Secondary | ICD-10-CM | POA: Diagnosis not present

## 2021-07-30 DIAGNOSIS — M25462 Effusion, left knee: Secondary | ICD-10-CM | POA: Diagnosis not present

## 2021-07-30 DIAGNOSIS — M6281 Muscle weakness (generalized): Secondary | ICD-10-CM | POA: Diagnosis not present

## 2021-07-30 DIAGNOSIS — M25662 Stiffness of left knee, not elsewhere classified: Secondary | ICD-10-CM | POA: Diagnosis not present

## 2021-08-01 DIAGNOSIS — R262 Difficulty in walking, not elsewhere classified: Secondary | ICD-10-CM | POA: Diagnosis not present

## 2021-08-01 DIAGNOSIS — M1712 Unilateral primary osteoarthritis, left knee: Secondary | ICD-10-CM | POA: Diagnosis not present

## 2021-08-01 DIAGNOSIS — M25662 Stiffness of left knee, not elsewhere classified: Secondary | ICD-10-CM | POA: Diagnosis not present

## 2021-08-01 DIAGNOSIS — M25462 Effusion, left knee: Secondary | ICD-10-CM | POA: Diagnosis not present

## 2021-08-01 DIAGNOSIS — M6281 Muscle weakness (generalized): Secondary | ICD-10-CM | POA: Diagnosis not present

## 2021-08-04 ENCOUNTER — Encounter: Payer: Self-pay | Admitting: Hematology and Oncology

## 2021-08-04 ENCOUNTER — Other Ambulatory Visit: Payer: Self-pay | Admitting: Hematology and Oncology

## 2021-08-04 ENCOUNTER — Other Ambulatory Visit: Payer: Self-pay

## 2021-08-04 ENCOUNTER — Inpatient Hospital Stay: Payer: Medicare Other | Attending: Hematology and Oncology

## 2021-08-04 ENCOUNTER — Inpatient Hospital Stay (HOSPITAL_BASED_OUTPATIENT_CLINIC_OR_DEPARTMENT_OTHER): Payer: Medicare Other | Admitting: Hematology and Oncology

## 2021-08-04 ENCOUNTER — Telehealth: Payer: Self-pay

## 2021-08-04 DIAGNOSIS — I2510 Atherosclerotic heart disease of native coronary artery without angina pectoris: Secondary | ICD-10-CM | POA: Diagnosis not present

## 2021-08-04 DIAGNOSIS — Z9221 Personal history of antineoplastic chemotherapy: Secondary | ICD-10-CM | POA: Insufficient documentation

## 2021-08-04 DIAGNOSIS — Z7901 Long term (current) use of anticoagulants: Secondary | ICD-10-CM | POA: Insufficient documentation

## 2021-08-04 DIAGNOSIS — Z923 Personal history of irradiation: Secondary | ICD-10-CM | POA: Diagnosis not present

## 2021-08-04 DIAGNOSIS — Z7961 Long term (current) use of immunomodulator: Secondary | ICD-10-CM | POA: Insufficient documentation

## 2021-08-04 DIAGNOSIS — C9000 Multiple myeloma not having achieved remission: Secondary | ICD-10-CM | POA: Diagnosis not present

## 2021-08-04 DIAGNOSIS — I10 Essential (primary) hypertension: Secondary | ICD-10-CM | POA: Insufficient documentation

## 2021-08-04 DIAGNOSIS — D61818 Other pancytopenia: Secondary | ICD-10-CM | POA: Insufficient documentation

## 2021-08-04 DIAGNOSIS — I700 Atherosclerosis of aorta: Secondary | ICD-10-CM | POA: Diagnosis not present

## 2021-08-04 DIAGNOSIS — E559 Vitamin D deficiency, unspecified: Secondary | ICD-10-CM | POA: Insufficient documentation

## 2021-08-04 LAB — COMPREHENSIVE METABOLIC PANEL
ALT: 24 U/L (ref 0–44)
AST: 20 U/L (ref 15–41)
Albumin: 3.5 g/dL (ref 3.5–5.0)
Alkaline Phosphatase: 74 U/L (ref 38–126)
Anion gap: 6 (ref 5–15)
BUN: 17 mg/dL (ref 8–23)
CO2: 26 mmol/L (ref 22–32)
Calcium: 8.6 mg/dL — ABNORMAL LOW (ref 8.9–10.3)
Chloride: 107 mmol/L (ref 98–111)
Creatinine, Ser: 0.78 mg/dL (ref 0.61–1.24)
GFR, Estimated: >60 mL/min (ref >60)
Glucose, Bld: 88 mg/dL (ref 70–99)
Potassium: 4.1 mmol/L (ref 3.5–5.1)
Sodium: 139 mmol/L (ref 135–145)
Total Bilirubin: 0.9 mg/dL (ref 0.3–1.2)
Total Protein: 6.4 g/dL — ABNORMAL LOW (ref 6.5–8.1)

## 2021-08-04 LAB — CBC WITH DIFFERENTIAL/PLATELET
Abs Immature Granulocytes: 0.01 10*3/uL (ref 0.00–0.07)
Basophils Absolute: 0 10*3/uL (ref 0.0–0.1)
Basophils Relative: 0 %
Eosinophils Absolute: 0.1 10*3/uL (ref 0.0–0.5)
Eosinophils Relative: 2 %
HCT: 36.2 % — ABNORMAL LOW (ref 39.0–52.0)
Hemoglobin: 12.4 g/dL — ABNORMAL LOW (ref 13.0–17.0)
Immature Granulocytes: 0 %
Lymphocytes Relative: 34 %
Lymphs Abs: 1.3 10*3/uL (ref 0.7–4.0)
MCH: 37.2 pg — ABNORMAL HIGH (ref 26.0–34.0)
MCHC: 34.3 g/dL (ref 30.0–36.0)
MCV: 108.7 fL — ABNORMAL HIGH (ref 80.0–100.0)
Monocytes Absolute: 0.6 10*3/uL (ref 0.1–1.0)
Monocytes Relative: 16 %
Neutro Abs: 1.8 10*3/uL (ref 1.7–7.7)
Neutrophils Relative %: 48 %
Platelets: 148 10*3/uL — ABNORMAL LOW (ref 150–400)
RBC: 3.33 MIL/uL — ABNORMAL LOW (ref 4.22–5.81)
RDW: 14.5 % (ref 11.5–15.5)
WBC: 3.8 10*3/uL — ABNORMAL LOW (ref 4.0–10.5)
nRBC: 0 % (ref 0.0–0.2)

## 2021-08-04 NOTE — Telephone Encounter (Signed)
Spoke with Wells Guiles, transplant coordinator at [facility]. She will fax over vaccine list. Given fax #

## 2021-08-04 NOTE — Telephone Encounter (Signed)
Called and left a message asking him to call the office back. Left the below message.

## 2021-08-04 NOTE — Telephone Encounter (Signed)
He called and left a message. He canceled the appt at [facility] as you suggested. They reminded him to get his vaccinations. He is asking if can get the vaccinations at [facility]?

## 2021-08-04 NOTE — Assessment & Plan Note (Signed)
His blood pressure is elevated today likely due to anxiety He will continue his prescribed antihypertensives

## 2021-08-04 NOTE — Progress Notes (Signed)
[place] [facility] OFFICE PROGRESS NOTE  Patient Care Team: Denita Lung, MD as PCP - General (Family Medicine) Heath Lark, MD as Consulting Physician (Hematology and Oncology)  ASSESSMENT & PLAN:  Multiple myeloma without remission [facility]) He is recovering well from recent knee surgery Pancytopenia is improved He will resume lenalidomide today He will continue acyclovir for antimicrobial prophylaxis He will continue calcium with vitamin D The last time he received Xgeva injection in June, he has severe bone pain We discussed switching him over to Zometa and he is in agreement He had recent regular dental visits without problems I plan to schedule Zometa in his next visit in December and he is in agreement  Pancytopenia, acquired [facility]) He has mild pancytopenia due to surgery He is not symptomatic His last B12 level was adequate Observe closely for now  Essential hypertension His blood pressure is elevated today likely due to anxiety He will continue his prescribed antihypertensives  No orders of the defined types were placed in this encounter.   All questions were answered. The patient knows to call the clinic with any problems, questions or concerns. The total time spent in the appointment was 20 minutes encounter with patients including review of chart and various tests results, discussions about plan of care and coordination of care plan   Heath Lark, MD 08/04/2021 10:34 AM  INTERVAL HISTORY: Please see below for problem oriented charting. he returns for treatment follow-up for multiple myeloma on Revlimid His treatment was placed on hold due to recent knee surgery He is recovering very well from his knee surgery and start physical therapy He has no bleeding complications from recent surgery No recent infection, fever or chills He would like his Xgeva to be switched to something else due to history of bone pain after Xgeva injection  REVIEW OF SYSTEMS:    Constitutional: Denies fevers, chills or abnormal weight loss Eyes: Denies blurriness of vision Ears, nose, mouth, throat, and face: Denies mucositis or sore throat Respiratory: Denies cough, dyspnea or wheezes Cardiovascular: Denies palpitation, chest discomfort or lower extremity swelling Gastrointestinal:  Denies nausea, heartburn or change in bowel habits Skin: Denies abnormal skin rashes Lymphatics: Denies new lymphadenopathy or easy bruising Neurological:Denies numbness, tingling or new weaknesses Behavioral/Psych: Mood is stable, no new changes  All other systems were reviewed with the patient and are negative.  I have reviewed the past medical history, past surgical history, social history and family history with the patient and they are unchanged from previous note.  ALLERGIES:  has No Known Allergies.  MEDICATIONS:  Current Outpatient Medications  Medication Sig Dispense Refill   acetaminophen (TYLENOL) 650 MG CR tablet Take 650 mg by mouth every 8 (eight) hours as needed for pain.     acyclovir (ZOVIRAX) 400 MG tablet Take 1 tablet (400 mg total) by mouth 2 (two) times daily. 180 tablet 11   calcium carbonate (TUMS - DOSED IN MG ELEMENTAL CALCIUM) 500 MG chewable tablet Chew 1-2 tablets by mouth daily as needed for heartburn.     Cholecalciferol (VITAMIN D) 50 MCG (2000 UT) tablet Take 2,000 Units by mouth daily.     folic acid (FOLVITE) 503 MCG tablet Take 400 mcg by mouth daily.     lenalidomide (REVLIMID) 15 MG capsule Take 1 capsule (15 mg total) by mouth daily. Take 1 capsule for 21 days, then stop for 7 days for a cycle of every 28 days 21 capsule 0   losartan-hydrochlorothiazide (HYZAAR) 100-12.5 MG tablet Take  1 tablet by mouth daily. 90 tablet 3   Multiple Vitamin (MULTI-VITAMIN) tablet Take by mouth.     oxyCODONE (ROXICODONE) 5 MG immediate release tablet Take 1 tablet (5 mg total) by mouth every 4 (four) hours as needed for severe pain. 30 tablet 0   Polyethyl  Glycol-Propyl Glycol (SYSTANE OP) Place 1 drop into both eyes daily as needed (dry eyes).     rivaroxaban (XARELTO) 10 MG TABS tablet Take 1 tablet (10 mg total) by mouth daily. 30 tablet 0   sennosides-docusate sodium (SENOKOT-S) 8.6-50 MG tablet Take 2 tablets by mouth daily. 30 tablet 1   vitamin B-12 (CYANOCOBALAMIN) 1000 MCG tablet Take 1,000 mcg by mouth daily.     No current facility-administered medications for this visit.    SUMMARY OF ONCOLOGIC HISTORY: Oncology History  Multiple myeloma without remission ([place])  03/20/2019 Imaging   1. Tumor involving the L5 and S1 and S2 segments of the spine as described with mass effects upon the left L4, L5, S1 and more distal left sacral nerves as described above. 2. Does the patient have a history of malignancy? 3. Multiple plasmacytomas and metastatic disease could give this appearance   03/27/2019 Pathology Results   Soft Tissue Needle Core Biopsy, sacral mass - PLASMABLASTIC NEOPLASM. - SEE COMMENT. Microscopic Comment The sections show needle core biopsy fragments of soft tissue densely infiltrated by a relatively monomorphic infiltrate of atypical plasmacytoid cells characterized by vesicular or partially clumped chromatin and prominent nucleoli. This is associated with scattered mitosis. A battery of immunohistochemical stains was performed and show that the atypical plasmacytoid cells are positive for CD138, CD43 and cytoplasmic kappa. There is also weak positivity for CD56 and partial variable positivity for cyclin D1. The atypical plasmacytoid cells are negative for cytoplasmic lambda, CD10, PAX5, CD79a, CD20, CD3, CD5, CD34, EBV (ISH) and mostly negative for LCA. The overall morphologic and histologic features are most compatible with a plasmablastic neoplasm. The differential diagnosis includes plasmablastic lymphoma and plasmablastic plasmacytoma/myeloma. Based on the overall phenotypic features and the clinical setting,  plasmacytoma/myeloma is favored. Clinical correlation and hematologic evaluation is recommended   03/27/2019 Procedure   Technically successful CT-guided biopsy of sacral soft tissue mass.   04/04/2019 Initial Diagnosis   Multiple myeloma without remission ([place])   04/11/2019 PET scan   IMPRESSION: 1. Previously noted expansile lesions involving the L5 vertebra and sacrum are again noted and exhibit intense FDG uptake compatible with metabolically active disease. A third focus of increased uptake without corresponding CT abnormality is noted within the intertrochanteric portions of the proximal right femur. 2. Small lucent lesion within the T3 vertebra is noted without corresponding FDG uptake. 3. Asymmetric left bladder wall thickening, etiology indeterminate. 4. Aortic Atherosclerosis (ICD10-I70.0). Lad coronary artery calcification.   04/12/2019 Cancer Staging   Staging form: Plasma Cell Myeloma and Plasma Cell Disorders, AJCC 8th Edition - Clinical stage from 04/12/2019: Beta-2-microglobulin (mg/L): 2, Albumin (g/dL): 3.6, ISS: Stage I, High-risk cytogenetics: Unknown, LDH: Unknown - Signed by Heath Lark, MD on 04/12/2019    04/14/2019 Bone Marrow Biopsy   Bone Marrow, Aspirate,Biopsy, and Clot, right iliac bone - PLASMA CELL MYELOMA.   04/17/2019 - 07/11/2019 Chemotherapy   The patient had bortezemib, revlimid and dexamethasone for chemotherapy treatment.     08/17/2019 Bone Marrow Transplant   He received high dose melphalan followed by autologous stem cell transplant at [facility]   11/22/2019 Bone Marrow Biopsy   BONE MARROW biopsy at [facility]:  Mild monoclonal plasmacytosis (approximately 2%), kappa light chain restricted.    11/22/2019 PET scan   PET CT at [facility] 1.  Similar size and appearance of mildly hypermetabolic lytic lesion in the L5 vertebral body and posterior elements.  2.  Otherwise, no substantial FDG uptake compared to background bone marrow in the  additional osseous lucent lesions.    12/20/2019 - 05/09/2020 Radiation Therapy   Radiation Treatment Dates: 12/20/2019 through 01/08/2020 Site Technique Total Dose (Gy) Dose per Fx (Gy) Completed Fx Beam Energies  Lumbar Spine: Spine 3D 35/35 2.5 14/14 15X        02/29/2020 PET scan   1. Similar size and appearance of expansile lytic lesion in the L5 vertebral body and posterior elements with minimal FDG activity.  2. Otherwise, no additional osseous lucent lesions with FDG uptake above background bone marrow   05/31/2020 -  Chemotherapy   The patient had Revlimid for chemotherapy treatment.       PHYSICAL EXAMINATION: ECOG PERFORMANCE STATUS: 1 - Symptomatic but completely ambulatory  Vitals:   08/04/21 1010  BP: (!) 152/93  Pulse: 94  Resp: 17  Temp: 98 F (36.7 C)  SpO2: 96%   Filed Weights   08/04/21 1010  Weight: 233 lb 3.2 oz (105.8 kg)    GENERAL:alert, no distress and comfortable Musculoskeletal:no cyanosis of digits and no clubbing  NEURO: alert & oriented x 3 with fluent speech, no focal motor/sensory deficits  LABORATORY DATA:  I have reviewed the data as listed    Component Value Date/Time   NA 136 07/16/2021 0321   NA 139 10/11/2018 1115   NA 139 04/05/2014 0849   K 4.1 07/16/2021 0321   K 4.4 04/05/2014 0849   CL 104 07/16/2021 0321   CO2 28 07/16/2021 0321   CO2 23 04/05/2014 0849   GLUCOSE 151 (H) 07/16/2021 0321   GLUCOSE 109 04/05/2014 0849   BUN 17 07/16/2021 0321   BUN 23 10/11/2018 1115   BUN 16.8 04/05/2014 0849   CREATININE 0.76 07/16/2021 0321   CREATININE 0.75 03/31/2021 1010   CREATININE 0.91 07/29/2017 1004   CREATININE 0.9 04/05/2014 0849   CALCIUM 7.6 (L) 07/16/2021 0321   CALCIUM 8.7 04/05/2014 0849   PROT 6.3 (L) 03/31/2021 1010   PROT 7.2 10/11/2018 1115   PROT 6.7 04/05/2014 0849   ALBUMIN 3.3 (L) 03/31/2021 1010   ALBUMIN 4.7 10/11/2018 1115   ALBUMIN 3.8 04/05/2014 0849   AST 25 03/31/2021 1010   AST 26 04/05/2014 0849    ALT 33 03/31/2021 1010   ALT 30 04/05/2014 0849   ALKPHOS 64 03/31/2021 1010   ALKPHOS 75 04/05/2014 0849   BILITOT 0.9 03/31/2021 1010   BILITOT 0.71 04/05/2014 0849   GFRNONAA >60 07/16/2021 0321   GFRNONAA >60 03/31/2021 1010   GFRAA >60 06/14/2020 0830    No results found for: SPEP, UPEP  Lab Results  Component Value Date   WBC 3.8 (L) 08/04/2021   NEUTROABS 1.8 08/04/2021   HGB 12.4 (L) 08/04/2021   HCT 36.2 (L) 08/04/2021   MCV 108.7 (H) 08/04/2021   PLT 148 (L) 08/04/2021      Chemistry      Component Value Date/Time   NA 136 07/16/2021 0321   NA 139 10/11/2018 1115   NA 139 04/05/2014 0849   K 4.1 07/16/2021 0321   K 4.4 04/05/2014 0849   CL 104 07/16/2021 0321   CO2 28 07/16/2021 0321   CO2 23 04/05/2014 0849  BUN 17 07/16/2021 0321   BUN 23 10/11/2018 1115   BUN 16.8 04/05/2014 0849   CREATININE 0.76 07/16/2021 0321   CREATININE 0.75 03/31/2021 1010   CREATININE 0.91 07/29/2017 1004   CREATININE 0.9 04/05/2014 0849   GLU 10 04/10/2014 0000      Component Value Date/Time   CALCIUM 7.6 (L) 07/16/2021 0321   CALCIUM 8.7 04/05/2014 0849   ALKPHOS 64 03/31/2021 1010   ALKPHOS 75 04/05/2014 0849   AST 25 03/31/2021 1010   AST 26 04/05/2014 0849   ALT 33 03/31/2021 1010   ALT 30 04/05/2014 0849   BILITOT 0.9 03/31/2021 1010   BILITOT 0.71 04/05/2014 0849       RADIOGRAPHIC STUDIES: I have personally reviewed the radiological images as listed and agreed with the findings in the report. DG Knee Left Port  Result Date: 07/15/2021 CLINICAL DATA:  Status post total left knee replacement EXAM: PORTABLE LEFT KNEE - 1-2 VIEW COMPARISON:  06/29/2018 FINDINGS: Status post interval total left knee arthroplasty. The prosthesis is in anatomic alignment. No perihardware lucency. No acute fracture. Air within the soft tissues and joint space are not unexpected postoperatively. IMPRESSION: Expected postoperative appearance of a left total knee arthroplasty.  Electronically Signed   By: Merilyn Baba M.D.   On: 07/15/2021 12:43

## 2021-08-04 NOTE — Assessment & Plan Note (Signed)
He has mild pancytopenia due to surgery He is not symptomatic His last B12 level was adequate Observe closely for now

## 2021-08-04 NOTE — Assessment & Plan Note (Signed)
He is recovering well from recent knee surgery Pancytopenia is improved He will resume lenalidomide today He will continue acyclovir for antimicrobial prophylaxis He will continue calcium with vitamin D The last time he received Xgeva injection in June, he has severe bone pain We discussed switching him over to Zometa and he is in agreement He had recent regular dental visits without problems I plan to schedule Zometa in his next visit in December and he is in agreement

## 2021-08-04 NOTE — Telephone Encounter (Signed)
Yes, ask for his transplant coordinator to fax the vaccination schedule to me and I will order them

## 2021-08-05 LAB — KAPPA/LAMBDA LIGHT CHAINS
Kappa free light chain: 25.2 mg/L — ABNORMAL HIGH (ref 3.3–19.4)
Kappa, lambda light chain ratio: 1.87 — ABNORMAL HIGH (ref 0.26–1.65)
Lambda free light chains: 13.5 mg/L (ref 5.7–26.3)

## 2021-08-06 ENCOUNTER — Other Ambulatory Visit: Payer: Self-pay | Admitting: Hematology and Oncology

## 2021-08-06 DIAGNOSIS — M25662 Stiffness of left knee, not elsewhere classified: Secondary | ICD-10-CM | POA: Diagnosis not present

## 2021-08-06 DIAGNOSIS — M1712 Unilateral primary osteoarthritis, left knee: Secondary | ICD-10-CM | POA: Diagnosis not present

## 2021-08-06 DIAGNOSIS — M6281 Muscle weakness (generalized): Secondary | ICD-10-CM | POA: Diagnosis not present

## 2021-08-06 DIAGNOSIS — M25462 Effusion, left knee: Secondary | ICD-10-CM | POA: Diagnosis not present

## 2021-08-06 DIAGNOSIS — R262 Difficulty in walking, not elsewhere classified: Secondary | ICD-10-CM | POA: Diagnosis not present

## 2021-08-07 ENCOUNTER — Telehealth: Payer: Self-pay | Admitting: Family Medicine

## 2021-08-07 ENCOUNTER — Telehealth: Payer: Self-pay

## 2021-08-07 LAB — MULTIPLE MYELOMA PANEL, SERUM
Albumin SerPl Elph-Mcnc: 3.5 g/dL (ref 2.9–4.4)
Albumin/Glob SerPl: 1.5 (ref 0.7–1.7)
Alpha 1: 0.3 g/dL (ref 0.0–0.4)
Alpha2 Glob SerPl Elph-Mcnc: 0.6 g/dL (ref 0.4–1.0)
B-Globulin SerPl Elph-Mcnc: 0.8 g/dL (ref 0.7–1.3)
Gamma Glob SerPl Elph-Mcnc: 0.8 g/dL (ref 0.4–1.8)
Globulin, Total: 2.4 g/dL (ref 2.2–3.9)
IgA: 111 mg/dL (ref 61–437)
IgG (Immunoglobin G), Serum: 1021 mg/dL (ref 603–1613)
IgM (Immunoglobulin M), Srm: 27 mg/dL (ref 20–172)
M Protein SerPl Elph-Mcnc: 0.3 g/dL — ABNORMAL HIGH
Total Protein ELP: 5.9 g/dL — ABNORMAL LOW (ref 6.0–8.5)

## 2021-08-07 NOTE — Telephone Encounter (Signed)
Called and told him Vaccine list sent from [facility]. The only vaccine that he needs is the MMR and the office can give on 12/12. He verbalized understanding.

## 2021-08-07 NOTE — Telephone Encounter (Signed)
Spoke with patient to schedule Medicare Annual Wellness Visit (AWV) either virtually or in office.  He wanted call back in 2023 a lot going on right now   Last AWV ;10/11/2018 please schedule at anytime with health coach  This should be a 45 minute visit.

## 2021-08-08 DIAGNOSIS — R262 Difficulty in walking, not elsewhere classified: Secondary | ICD-10-CM | POA: Diagnosis not present

## 2021-08-08 DIAGNOSIS — M25462 Effusion, left knee: Secondary | ICD-10-CM | POA: Diagnosis not present

## 2021-08-08 DIAGNOSIS — M25662 Stiffness of left knee, not elsewhere classified: Secondary | ICD-10-CM | POA: Diagnosis not present

## 2021-08-08 DIAGNOSIS — M1712 Unilateral primary osteoarthritis, left knee: Secondary | ICD-10-CM | POA: Diagnosis not present

## 2021-08-08 DIAGNOSIS — M6281 Muscle weakness (generalized): Secondary | ICD-10-CM | POA: Diagnosis not present

## 2021-08-12 ENCOUNTER — Telehealth: Payer: Self-pay

## 2021-08-12 NOTE — Telephone Encounter (Signed)
Returned patient's call regarding any possible interactions with newly prescribed prednisone pack and revlimid prescription. Relayed that Dr. Alvy Bimler is comfortable with patient taking both medications. Patient appreciative of clarification.

## 2021-08-15 DIAGNOSIS — R262 Difficulty in walking, not elsewhere classified: Secondary | ICD-10-CM | POA: Diagnosis not present

## 2021-08-15 DIAGNOSIS — M6281 Muscle weakness (generalized): Secondary | ICD-10-CM | POA: Diagnosis not present

## 2021-08-15 DIAGNOSIS — M1712 Unilateral primary osteoarthritis, left knee: Secondary | ICD-10-CM | POA: Diagnosis not present

## 2021-08-15 DIAGNOSIS — M25462 Effusion, left knee: Secondary | ICD-10-CM | POA: Diagnosis not present

## 2021-08-15 DIAGNOSIS — M25662 Stiffness of left knee, not elsewhere classified: Secondary | ICD-10-CM | POA: Diagnosis not present

## 2021-08-18 DIAGNOSIS — M6281 Muscle weakness (generalized): Secondary | ICD-10-CM | POA: Diagnosis not present

## 2021-08-18 DIAGNOSIS — R262 Difficulty in walking, not elsewhere classified: Secondary | ICD-10-CM | POA: Diagnosis not present

## 2021-08-18 DIAGNOSIS — M1712 Unilateral primary osteoarthritis, left knee: Secondary | ICD-10-CM | POA: Diagnosis not present

## 2021-08-18 DIAGNOSIS — M25662 Stiffness of left knee, not elsewhere classified: Secondary | ICD-10-CM | POA: Diagnosis not present

## 2021-08-18 DIAGNOSIS — M25462 Effusion, left knee: Secondary | ICD-10-CM | POA: Diagnosis not present

## 2021-08-22 DIAGNOSIS — R262 Difficulty in walking, not elsewhere classified: Secondary | ICD-10-CM | POA: Diagnosis not present

## 2021-08-22 DIAGNOSIS — M1712 Unilateral primary osteoarthritis, left knee: Secondary | ICD-10-CM | POA: Diagnosis not present

## 2021-08-22 DIAGNOSIS — M25462 Effusion, left knee: Secondary | ICD-10-CM | POA: Diagnosis not present

## 2021-08-22 DIAGNOSIS — M6281 Muscle weakness (generalized): Secondary | ICD-10-CM | POA: Diagnosis not present

## 2021-08-22 DIAGNOSIS — M25662 Stiffness of left knee, not elsewhere classified: Secondary | ICD-10-CM | POA: Diagnosis not present

## 2021-08-25 DIAGNOSIS — M1712 Unilateral primary osteoarthritis, left knee: Secondary | ICD-10-CM | POA: Diagnosis not present

## 2021-08-26 ENCOUNTER — Other Ambulatory Visit: Payer: Self-pay

## 2021-08-26 DIAGNOSIS — Z961 Presence of intraocular lens: Secondary | ICD-10-CM | POA: Diagnosis not present

## 2021-08-26 DIAGNOSIS — H04123 Dry eye syndrome of bilateral lacrimal glands: Secondary | ICD-10-CM | POA: Diagnosis not present

## 2021-08-26 DIAGNOSIS — H35371 Puckering of macula, right eye: Secondary | ICD-10-CM | POA: Diagnosis not present

## 2021-08-26 DIAGNOSIS — C9000 Multiple myeloma not having achieved remission: Secondary | ICD-10-CM

## 2021-08-26 MED ORDER — LENALIDOMIDE 15 MG PO CAPS: 15.0000 mg | ORAL_CAPSULE | Freq: Every day | ORAL | 0 refills | Status: DC

## 2021-08-27 DIAGNOSIS — M6281 Muscle weakness (generalized): Secondary | ICD-10-CM | POA: Diagnosis not present

## 2021-08-27 DIAGNOSIS — M1712 Unilateral primary osteoarthritis, left knee: Secondary | ICD-10-CM | POA: Diagnosis not present

## 2021-08-27 DIAGNOSIS — M25662 Stiffness of left knee, not elsewhere classified: Secondary | ICD-10-CM | POA: Diagnosis not present

## 2021-08-27 DIAGNOSIS — R262 Difficulty in walking, not elsewhere classified: Secondary | ICD-10-CM | POA: Diagnosis not present

## 2021-08-27 DIAGNOSIS — M25462 Effusion, left knee: Secondary | ICD-10-CM | POA: Diagnosis not present

## 2021-09-03 DIAGNOSIS — M1712 Unilateral primary osteoarthritis, left knee: Secondary | ICD-10-CM | POA: Diagnosis not present

## 2021-09-03 DIAGNOSIS — M6281 Muscle weakness (generalized): Secondary | ICD-10-CM | POA: Diagnosis not present

## 2021-09-03 DIAGNOSIS — M25462 Effusion, left knee: Secondary | ICD-10-CM | POA: Diagnosis not present

## 2021-09-03 DIAGNOSIS — R262 Difficulty in walking, not elsewhere classified: Secondary | ICD-10-CM | POA: Diagnosis not present

## 2021-09-03 DIAGNOSIS — M25662 Stiffness of left knee, not elsewhere classified: Secondary | ICD-10-CM | POA: Diagnosis not present

## 2021-09-05 DIAGNOSIS — M25662 Stiffness of left knee, not elsewhere classified: Secondary | ICD-10-CM | POA: Diagnosis not present

## 2021-09-05 DIAGNOSIS — M1712 Unilateral primary osteoarthritis, left knee: Secondary | ICD-10-CM | POA: Diagnosis not present

## 2021-09-05 DIAGNOSIS — M6281 Muscle weakness (generalized): Secondary | ICD-10-CM | POA: Diagnosis not present

## 2021-09-05 DIAGNOSIS — R262 Difficulty in walking, not elsewhere classified: Secondary | ICD-10-CM | POA: Diagnosis not present

## 2021-09-05 DIAGNOSIS — M25462 Effusion, left knee: Secondary | ICD-10-CM | POA: Diagnosis not present

## 2021-09-09 ENCOUNTER — Other Ambulatory Visit: Payer: Self-pay | Admitting: Hematology and Oncology

## 2021-09-09 ENCOUNTER — Telehealth: Payer: Self-pay

## 2021-09-09 DIAGNOSIS — Z20822 Contact with and (suspected) exposure to covid-19: Secondary | ICD-10-CM | POA: Diagnosis not present

## 2021-09-09 MED ORDER — OXYCODONE HCL 5 MG PO TABS: 5.0000 mg | ORAL_TABLET | ORAL | 0 refills | Status: DC | PRN

## 2021-09-09 NOTE — Telephone Encounter (Signed)
Called and given below message. He verbalized understanding. 

## 2021-09-09 NOTE — Telephone Encounter (Signed)
I can prescribe some pain medicine but revlimid does not cause back pain I will discuss with him next week regarding ordering some imaging

## 2021-09-09 NOTE — Telephone Encounter (Signed)
He called and left a message. Since he started back on the Revlimid  he is having back pain/ very uncomfortable.  Asking for a prescription for pain.

## 2021-09-10 DIAGNOSIS — M25662 Stiffness of left knee, not elsewhere classified: Secondary | ICD-10-CM | POA: Diagnosis not present

## 2021-09-10 DIAGNOSIS — M1712 Unilateral primary osteoarthritis, left knee: Secondary | ICD-10-CM | POA: Diagnosis not present

## 2021-09-10 DIAGNOSIS — R262 Difficulty in walking, not elsewhere classified: Secondary | ICD-10-CM | POA: Diagnosis not present

## 2021-09-10 DIAGNOSIS — M6281 Muscle weakness (generalized): Secondary | ICD-10-CM | POA: Diagnosis not present

## 2021-09-10 DIAGNOSIS — M25462 Effusion, left knee: Secondary | ICD-10-CM | POA: Diagnosis not present

## 2021-09-12 DIAGNOSIS — M25462 Effusion, left knee: Secondary | ICD-10-CM | POA: Diagnosis not present

## 2021-09-12 DIAGNOSIS — M25662 Stiffness of left knee, not elsewhere classified: Secondary | ICD-10-CM | POA: Diagnosis not present

## 2021-09-12 DIAGNOSIS — M6281 Muscle weakness (generalized): Secondary | ICD-10-CM | POA: Diagnosis not present

## 2021-09-12 DIAGNOSIS — M1712 Unilateral primary osteoarthritis, left knee: Secondary | ICD-10-CM | POA: Diagnosis not present

## 2021-09-12 DIAGNOSIS — R262 Difficulty in walking, not elsewhere classified: Secondary | ICD-10-CM | POA: Diagnosis not present

## 2021-09-15 ENCOUNTER — Inpatient Hospital Stay: Payer: Medicare Other

## 2021-09-15 ENCOUNTER — Encounter: Payer: Self-pay | Admitting: Hematology and Oncology

## 2021-09-15 ENCOUNTER — Inpatient Hospital Stay: Payer: Medicare Other | Attending: Hematology and Oncology

## 2021-09-15 ENCOUNTER — Inpatient Hospital Stay (HOSPITAL_BASED_OUTPATIENT_CLINIC_OR_DEPARTMENT_OTHER): Payer: Medicare Other | Admitting: Hematology and Oncology

## 2021-09-15 ENCOUNTER — Other Ambulatory Visit: Payer: Self-pay

## 2021-09-15 DIAGNOSIS — D61810 Antineoplastic chemotherapy induced pancytopenia: Secondary | ICD-10-CM | POA: Diagnosis present

## 2021-09-15 DIAGNOSIS — E669 Obesity, unspecified: Secondary | ICD-10-CM

## 2021-09-15 DIAGNOSIS — Z803 Family history of malignant neoplasm of breast: Secondary | ICD-10-CM | POA: Diagnosis not present

## 2021-09-15 DIAGNOSIS — I2693 Single subsegmental pulmonary embolism without acute cor pulmonale: Secondary | ICD-10-CM | POA: Diagnosis not present

## 2021-09-15 DIAGNOSIS — Z23 Encounter for immunization: Secondary | ICD-10-CM | POA: Diagnosis not present

## 2021-09-15 DIAGNOSIS — Z9481 Bone marrow transplant status: Secondary | ICD-10-CM

## 2021-09-15 DIAGNOSIS — N400 Enlarged prostate without lower urinary tract symptoms: Secondary | ICD-10-CM | POA: Diagnosis present

## 2021-09-15 DIAGNOSIS — Z6837 Body mass index (BMI) 37.0-37.9, adult: Secondary | ICD-10-CM | POA: Diagnosis not present

## 2021-09-15 DIAGNOSIS — Z20822 Contact with and (suspected) exposure to covid-19: Secondary | ICD-10-CM | POA: Diagnosis present

## 2021-09-15 DIAGNOSIS — M7918 Myalgia, other site: Secondary | ICD-10-CM

## 2021-09-15 DIAGNOSIS — E876 Hypokalemia: Secondary | ICD-10-CM | POA: Diagnosis present

## 2021-09-15 DIAGNOSIS — C9000 Multiple myeloma not having achieved remission: Secondary | ICD-10-CM

## 2021-09-15 DIAGNOSIS — Z66 Do not resuscitate: Secondary | ICD-10-CM | POA: Diagnosis present

## 2021-09-15 DIAGNOSIS — G8929 Other chronic pain: Secondary | ICD-10-CM | POA: Diagnosis not present

## 2021-09-15 DIAGNOSIS — I10 Essential (primary) hypertension: Secondary | ICD-10-CM | POA: Diagnosis present

## 2021-09-15 DIAGNOSIS — R17 Unspecified jaundice: Secondary | ICD-10-CM | POA: Diagnosis present

## 2021-09-15 DIAGNOSIS — I517 Cardiomegaly: Secondary | ICD-10-CM | POA: Diagnosis not present

## 2021-09-15 DIAGNOSIS — R0602 Shortness of breath: Secondary | ICD-10-CM | POA: Diagnosis not present

## 2021-09-15 DIAGNOSIS — I82402 Acute embolism and thrombosis of unspecified deep veins of left lower extremity: Secondary | ICD-10-CM | POA: Diagnosis not present

## 2021-09-15 DIAGNOSIS — I2602 Saddle embolus of pulmonary artery with acute cor pulmonale: Secondary | ICD-10-CM | POA: Diagnosis not present

## 2021-09-15 DIAGNOSIS — I82452 Acute embolism and thrombosis of left peroneal vein: Secondary | ICD-10-CM | POA: Diagnosis present

## 2021-09-15 DIAGNOSIS — R0789 Other chest pain: Secondary | ICD-10-CM | POA: Diagnosis not present

## 2021-09-15 DIAGNOSIS — I2609 Other pulmonary embolism with acute cor pulmonale: Secondary | ICD-10-CM | POA: Diagnosis not present

## 2021-09-15 DIAGNOSIS — E871 Hypo-osmolality and hyponatremia: Secondary | ICD-10-CM | POA: Diagnosis present

## 2021-09-15 DIAGNOSIS — R7401 Elevation of levels of liver transaminase levels: Secondary | ICD-10-CM | POA: Diagnosis present

## 2021-09-15 DIAGNOSIS — T451X5A Adverse effect of antineoplastic and immunosuppressive drugs, initial encounter: Secondary | ICD-10-CM | POA: Diagnosis present

## 2021-09-15 DIAGNOSIS — K449 Diaphragmatic hernia without obstruction or gangrene: Secondary | ICD-10-CM | POA: Diagnosis not present

## 2021-09-15 DIAGNOSIS — D61818 Other pancytopenia: Secondary | ICD-10-CM

## 2021-09-15 DIAGNOSIS — R079 Chest pain, unspecified: Secondary | ICD-10-CM | POA: Diagnosis not present

## 2021-09-15 DIAGNOSIS — Z96652 Presence of left artificial knee joint: Secondary | ICD-10-CM | POA: Diagnosis present

## 2021-09-15 DIAGNOSIS — R739 Hyperglycemia, unspecified: Secondary | ICD-10-CM | POA: Diagnosis present

## 2021-09-15 DIAGNOSIS — J9811 Atelectasis: Secondary | ICD-10-CM | POA: Diagnosis not present

## 2021-09-15 DIAGNOSIS — I2699 Other pulmonary embolism without acute cor pulmonale: Secondary | ICD-10-CM | POA: Diagnosis not present

## 2021-09-15 LAB — CBC WITH DIFFERENTIAL/PLATELET
Abs Immature Granulocytes: 0.01 10*3/uL (ref 0.00–0.07)
Basophils Absolute: 0 10*3/uL (ref 0.0–0.1)
Basophils Relative: 1 %
Eosinophils Absolute: 0.1 10*3/uL (ref 0.0–0.5)
Eosinophils Relative: 4 %
HCT: 37.8 % — ABNORMAL LOW (ref 39.0–52.0)
Hemoglobin: 13 g/dL (ref 13.0–17.0)
Immature Granulocytes: 0 %
Lymphocytes Relative: 32 %
Lymphs Abs: 1.2 10*3/uL (ref 0.7–4.0)
MCH: 36.9 pg — ABNORMAL HIGH (ref 26.0–34.0)
MCHC: 34.4 g/dL (ref 30.0–36.0)
MCV: 107.4 fL — ABNORMAL HIGH (ref 80.0–100.0)
Monocytes Absolute: 0.6 10*3/uL (ref 0.1–1.0)
Monocytes Relative: 16 %
Neutro Abs: 1.8 10*3/uL (ref 1.7–7.7)
Neutrophils Relative %: 47 %
Platelets: 128 10*3/uL — ABNORMAL LOW (ref 150–400)
RBC: 3.52 MIL/uL — ABNORMAL LOW (ref 4.22–5.81)
RDW: 13.5 % (ref 11.5–15.5)
WBC: 3.7 10*3/uL — ABNORMAL LOW (ref 4.0–10.5)
nRBC: 0 % (ref 0.0–0.2)

## 2021-09-15 LAB — COMPREHENSIVE METABOLIC PANEL
ALT: 27 U/L (ref 0–44)
AST: 20 U/L (ref 15–41)
Albumin: 3.5 g/dL (ref 3.5–5.0)
Alkaline Phosphatase: 65 U/L (ref 38–126)
Anion gap: 9 (ref 5–15)
BUN: 16 mg/dL (ref 8–23)
CO2: 25 mmol/L (ref 22–32)
Calcium: 8.4 mg/dL — ABNORMAL LOW (ref 8.9–10.3)
Chloride: 105 mmol/L (ref 98–111)
Creatinine, Ser: 0.78 mg/dL (ref 0.61–1.24)
GFR, Estimated: >60 mL/min (ref >60)
Glucose, Bld: 117 mg/dL — ABNORMAL HIGH (ref 70–99)
Potassium: 3.6 mmol/L (ref 3.5–5.1)
Sodium: 139 mmol/L (ref 135–145)
Total Bilirubin: 1.1 mg/dL (ref 0.3–1.2)
Total Protein: 6.4 g/dL — ABNORMAL LOW (ref 6.5–8.1)

## 2021-09-15 MED ORDER — ZOLEDRONIC ACID 4 MG/100ML IV SOLN
4.0000 mg | Freq: Once | INTRAVENOUS | Status: AC
  Administered 2021-09-15: 4 mg via INTRAVENOUS
  Filled 2021-09-15: qty 100

## 2021-09-15 MED ORDER — SODIUM CHLORIDE 0.9 % IV SOLN: Freq: Once | INTRAVENOUS | Status: AC

## 2021-09-15 MED ORDER — MEASLES, MUMPS & RUBELLA VAC IJ SOLR
0.5000 mL | Freq: Once | INTRAMUSCULAR | Status: AC
  Administered 2021-09-15: 0.5 mL via SUBCUTANEOUS
  Filled 2021-09-15: qty 0.5

## 2021-09-15 NOTE — Assessment & Plan Note (Signed)
He is recovering well from recent knee surgery Pancytopenia is stable He will continue taking single agent lenalidomide  He will continue acyclovir for antimicrobial prophylaxis He will continue calcium with vitamin D The last time he received Xgeva injection in June, he has severe bone pain We discussed switching him over to Zometa and he is in agreement He had recent regular dental visits without problems He will proceed with treatment today

## 2021-09-15 NOTE — Assessment & Plan Note (Signed)
He has mild pancytopenia due to surgery He is not symptomatic His last B12 level was adequate Observe closely for now

## 2021-09-15 NOTE — Assessment & Plan Note (Signed)
We discussed the importance of weight loss and healthy living He appears motivated to lose some weight after the holidays

## 2021-09-15 NOTE — Progress Notes (Signed)
Per Dr. Alvy Bimler, ok to proceed with todays labs.

## 2021-09-15 NOTE — Assessment & Plan Note (Signed)
He declined going back to [facility] I have arranged for him to get MMR vaccine today

## 2021-09-15 NOTE — Assessment & Plan Note (Signed)
He had recent exacerbation of back pain likely due to changes in his gait after knee surgery He was prescribed oxycodone with relief He is trying to take less and went back to ibuprofen recently He is still undergoing physical therapy and rehab I recommend the patient to discuss this with physical therapy to reassess his gait

## 2021-09-15 NOTE — Patient Instructions (Signed)
Measles/Mumps/Rubella Vaccines, MMR injection What is this medication? MEASLES VIRUS; MUMPS VIRUS; RUBELLA VIRUS VACCINE LIVE (MEE zuhlz  VAHY ruhs; muhmps  VAHY ruhs; roo bel uh  VAHY ruhs  vak SEEN  Baggs ) is used to prevent an infection with measles (rubeola), mumps, and rubella ([place] measles) viruses. It is used to prevent infection in children over 17 months old, adults that have not been vaccinated and are not pregnant, and anyone traveling to countries where there are high rates of measles, mumps, or rubella. This medicine may be used for other purposes; ask your health care provider or pharmacist if you have questions. COMMON BRAND NAME(S): M-M-R II What should I tell my care team before I take this medication? They need to know if you have any of these conditions: bleeding disorder cancer including leukemia or lymphoma immune system problems infection with fever low levels of platelets in the blood recent blood transfusion or immune globulin infusion seizure disorder taking medicines for immunosuppression an unusual or allergic reaction to vaccines, eggs, neomycin, gelatin, other medicines, foods, dyes, or preservatives pregnant or trying to get pregnant breast-feeding How should I use this medication? This vaccine is for injection under the skin. It is given by a health care professional. A copy of Vaccine Information Statements will be given before each vaccination. Read this sheet carefully each time. The sheet may change frequently. Talk to your pediatrician regarding the use of this medicine in children. While this drug may be prescribed for children as young as 57 months of age for selected conditions, precautions do apply. Overdosage: If you think you have taken too much of this medicine contact a poison control center or emergency room at once. NOTE: This medicine is only for you. Do not share this medicine with others. What if I miss a dose? Keep appointments for  follow-up (booster) doses as directed. It is important not to miss your dose. Call your doctor or health care professional if you are unable to keep an appointment. What may interact with this medication? Do not take this medicine with any of the following medications: adalimumab anakinra etanercept infliximab medicines that suppress your immune system medicines to treat cancer This medicine may also interact with the following medications: immune globulins live virus vaccines This list may not describe all possible interactions. Give your health care provider a list of all the medicines, herbs, non-prescription drugs, or dietary supplements you use. Also tell them if you smoke, drink alcohol, or use illegal drugs. Some items may interact with your medicine. What should I watch for while using this medication? Visit your doctor for check-ups as directed. Do not become pregnant for 3 months after receiving this vaccine. Women should inform their doctor if they wish to become pregnant or think they might be pregnant. There is a potential for serious side effects to an unborn child. Talk to your health care professional or pharmacist for more information. What side effects may I notice from receiving this medication? Side effects that you should report to your doctor or health care professional as soon as possible: allergic reactions like skin rash, itching or hives, swelling of the face, lips, or tongue breathing problems changes in hearing changes in vision difficulty walking extreme changes in behavior fast, irregular heartbeat fever over 100 degrees F pain, tingling, numbness in the hands or feet seizures unusual bleeding or bruising unusually weak or tired Side effects that usually do not require medical attention (report to your doctor or health care professional  if they continue or are bothersome): aches or pains bruising, pain, swelling at site where  injected diarrhea headache low-grade fever of 100 degrees F or less nausea, vomiting runny nose, cough sleepy swollen glands This list may not describe all possible side effects. Call your doctor for medical advice about side effects. You may report side effects to FDA at 1-800-FDA-1088. Where should I keep my medication? This drug is given in a hospital or clinic and will not be stored at home. NOTE: This sheet is a summary. It may not cover all possible information. If you have questions about this medicine, talk to your doctor, pharmacist, or health care provider.  2022 Elsevier/Gold Standard (2013-10-25 00:00:00)  Zoledronic Acid Injection (Hypercalcemia, Oncology) What is this medication? ZOLEDRONIC ACID (ZOE le dron ik AS id) slows calcium loss from bones. It high calcium levels in the blood from some kinds of cancer. It may be used in other people at risk for bone loss. This medicine may be used for other purposes; ask your health care provider or pharmacist if you have questions. COMMON BRAND NAME(S): Zometa What should I tell my care team before I take this medication? They need to know if you have any of these conditions: cancer dehydration dental disease kidney disease liver disease low levels of calcium in the blood lung or breathing disease (asthma) receiving steroids like dexamethasone or prednisone an unusual or allergic reaction to zoledronic acid, other medicines, foods, dyes, or preservatives pregnant or trying to get pregnant breast-feeding How should I use this medication? This drug is injected into a vein. It is given by a health care provider in a hospital or clinic setting. Talk to your health care provider about the use of this drug in children. Special care may be needed. Overdosage: If you think you have taken too much of this medicine contact a poison control center or emergency room at once. NOTE: This medicine is only for you. Do not share this  medicine with others. What if I miss a dose? Keep appointments for follow-up doses. It is important not to miss your dose. Call your health care provider if you are unable to keep an appointment. What may interact with this medication? certain antibiotics given by injection NSAIDs, medicines for pain and inflammation, like ibuprofen or naproxen some diuretics like bumetanide, furosemide teriparatide thalidomide This list may not describe all possible interactions. Give your health care provider a list of all the medicines, herbs, non-prescription drugs, or dietary supplements you use. Also tell them if you smoke, drink alcohol, or use illegal drugs. Some items may interact with your medicine. What should I watch for while using this medication? Visit your health care provider for regular checks on your progress. It may be some time before you see the benefit from this drug. Some people who take this drug have severe bone, joint, or muscle pain. This drug may also increase your risk for jaw problems or a broken thigh bone. Tell your health care provider right away if you have severe pain in your jaw, bones, joints, or muscles. Tell you health care provider if you have any pain that does not go away or that gets worse. Tell your dentist and dental surgeon that you are taking this drug. You should not have major dental surgery while on this drug. See your dentist to have a dental exam and fix any dental problems before starting this drug. Take good care of your teeth while on this drug. Make sure you  see your dentist for regular follow-up appointments. You should make sure you get enough calcium and vitamin D while you are taking this drug. Discuss the foods you eat and the vitamins you take with your health care provider. Check with your health care provider if you have severe diarrhea, nausea, and vomiting, or if you sweat a lot. The loss of too much body fluid may make it dangerous for you to take  this drug. You may need blood work done while you are taking this drug. Do not become pregnant while taking this drug. Women should inform their health care provider if they wish to become pregnant or think they might be pregnant. There is potential for serious harm to an unborn child. Talk to your health care provider for more information. What side effects may I notice from receiving this medication? Side effects that you should report to your doctor or health care provider as soon as possible: allergic reactions (skin rash, itching or hives; swelling of the face, lips, or tongue) bone pain infection (fever, chills, cough, sore throat, pain or trouble passing urine) jaw pain, especially after dental work joint pain kidney injury (trouble passing urine or change in the amount of urine) low blood pressure (dizziness; feeling faint or lightheaded, falls; unusually weak or tired) low calcium levels (fast heartbeat; muscle cramps or pain; pain, tingling, or numbness in the hands or feet; seizures) low magnesium levels (fast, irregular heartbeat; muscle cramp or pain; muscle weakness; tremors; seizures) low red blood cell counts (trouble breathing; feeling faint; lightheaded, falls; unusually weak or tired) muscle pain redness, blistering, peeling, or loosening of the skin, including inside the mouth severe diarrhea swelling of the ankles, feet, hands trouble breathing Side effects that usually do not require medical attention (report to your doctor or health care provider if they continue or are bothersome): anxious constipation coughing depressed mood eye irritation, itching, or pain fever general ill feeling or flu-like symptoms nausea pain, redness, or irritation at site where injected trouble sleeping This list may not describe all possible side effects. Call your doctor for medical advice about side effects. You may report side effects to FDA at 1-800-FDA-1088. Where should I keep  my medication? This drug is given in a hospital or clinic. It will not be stored at home. NOTE: This sheet is a summary. It may not cover all possible information. If you have questions about this medicine, talk to your doctor, pharmacist, or health care provider.  2022 Elsevier/Gold Standard (2021-06-10 00:00:00)

## 2021-09-15 NOTE — Progress Notes (Signed)
[facility] OFFICE PROGRESS NOTE  Patient Care Team: Denita Lung, MD as PCP - General (Family Medicine) Heath Lark, MD as Consulting Physician (Hematology and Oncology)  ASSESSMENT & PLAN:  Multiple myeloma without remission [facility]) He is recovering well from recent knee surgery Pancytopenia is stable He will continue taking single agent lenalidomide  He will continue acyclovir for antimicrobial prophylaxis He will continue calcium with vitamin D The last time he received Xgeva injection in June, he has severe bone pain We discussed switching him over to Zometa and he is in agreement He had recent regular dental visits without problems He will proceed with treatment today  S/P bone marrow transplant [facility]) He declined going back to [facility] I have arranged for him to get MMR vaccine today  Pancytopenia, acquired ([place]) He has mild pancytopenia due to surgery He is not symptomatic His last B12 level was adequate Observe closely for now  Obesity (BMI 30-39.9) We discussed the importance of weight loss and healthy living He appears motivated to lose some weight after the holidays  Chronic musculoskeletal pain He had recent exacerbation of back pain likely due to changes in his gait after knee surgery He was prescribed oxycodone with relief He is trying to take less and went back to ibuprofen recently He is still undergoing physical therapy and rehab I recommend the patient to discuss this with physical therapy to reassess his gait   No orders of the defined types were placed in this encounter.   All questions were answered. The patient knows to call the clinic with any problems, questions or concerns. The total time spent in the appointment was 30 minutes encounter with patients including review of chart and various tests results, discussions about plan of care and coordination of care plan   Heath Lark, MD 09/15/2021 10:38 AM  INTERVAL  HISTORY: Please see below for problem oriented charting. he returns for treatment follow-up on Revlimid for recurrent multiple myeloma He is doing well except for recent back pain that has since subsided He is not walking normal due to recent knee surgery No recent infection, fever or chills He has gained some weight since last time I saw him  REVIEW OF SYSTEMS:   Constitutional: Denies fevers, chills or abnormal weight loss Eyes: Denies blurriness of vision Ears, nose, mouth, throat, and face: Denies mucositis or sore throat Respiratory: Denies cough, dyspnea or wheezes Cardiovascular: Denies palpitation, chest discomfort or lower extremity swelling Gastrointestinal:  Denies nausea, heartburn or change in bowel habits Skin: Denies abnormal skin rashes Lymphatics: Denies new lymphadenopathy or easy bruising Neurological:Denies numbness, tingling or new weaknesses Behavioral/Psych: Mood is stable, no new changes  All other systems were reviewed with the patient and are negative.  I have reviewed the past medical history, past surgical history, social history and family history with the patient and they are unchanged from previous note.  ALLERGIES:  has No Known Allergies.  MEDICATIONS:  Current Outpatient Medications  Medication Sig Dispense Refill   acetaminophen (TYLENOL) 650 MG CR tablet Take 650 mg by mouth every 8 (eight) hours as needed for pain.     acyclovir (ZOVIRAX) 400 MG tablet Take 1 tablet (400 mg total) by mouth 2 (two) times daily. 180 tablet 11   calcium carbonate (TUMS - DOSED IN MG ELEMENTAL CALCIUM) 500 MG chewable tablet Chew 1-2 tablets by mouth daily as needed for heartburn.     Cholecalciferol (VITAMIN D) 50 MCG (2000 UT) tablet Take 2,000 Units  by mouth daily.     folic acid (FOLVITE) 563 MCG tablet Take 400 mcg by mouth daily.     lenalidomide (REVLIMID) 15 MG capsule Take 1 capsule (15 mg total) by mouth daily. Take 1 capsule for 21 days, then stop for 7  days for a cycle of every 28 days 21 capsule 0   losartan-hydrochlorothiazide (HYZAAR) 100-12.5 MG tablet Take 1 tablet by mouth daily. 90 tablet 3   Multiple Vitamin (MULTI-VITAMIN) tablet Take by mouth.     oxyCODONE (ROXICODONE) 5 MG immediate release tablet Take 1 tablet (5 mg total) by mouth every 4 (four) hours as needed for severe pain. 30 tablet 0   Polyethyl Glycol-Propyl Glycol (SYSTANE OP) Place 1 drop into both eyes daily as needed (dry eyes).     rivaroxaban (XARELTO) 10 MG TABS tablet Take 1 tablet (10 mg total) by mouth daily. 30 tablet 0   sennosides-docusate sodium (SENOKOT-S) 8.6-50 MG tablet Take 2 tablets by mouth daily. 30 tablet 1   vitamin B-12 (CYANOCOBALAMIN) 1000 MCG tablet Take 1,000 mcg by mouth daily.     No current facility-administered medications for this visit.    SUMMARY OF ONCOLOGIC HISTORY: Oncology History  Multiple myeloma without remission ([place])  03/20/2019 Imaging   1. Tumor involving the L5 and S1 and S2 segments of the spine as described with mass effects upon the left L4, L5, S1 and more distal left sacral nerves as described above. 2. Does the patient have a history of malignancy? 3. Multiple plasmacytomas and metastatic disease could give this appearance   03/27/2019 Pathology Results   Soft Tissue Needle Core Biopsy, sacral mass - PLASMABLASTIC NEOPLASM. - SEE COMMENT. Microscopic Comment The sections show needle core biopsy fragments of soft tissue densely infiltrated by a relatively monomorphic infiltrate of atypical plasmacytoid cells characterized by vesicular or partially clumped chromatin and prominent nucleoli. This is associated with scattered mitosis. A battery of immunohistochemical stains was performed and show that the atypical plasmacytoid cells are positive for CD138, CD43 and cytoplasmic kappa. There is also weak positivity for CD56 and partial variable positivity for cyclin D1. The atypical plasmacytoid cells are negative for  cytoplasmic lambda, CD10, PAX5, CD79a, CD20, CD3, CD5, CD34, EBV (ISH) and mostly negative for LCA. The overall morphologic and histologic features are most compatible with a plasmablastic neoplasm. The differential diagnosis includes plasmablastic lymphoma and plasmablastic plasmacytoma/myeloma. Based on the overall phenotypic features and the clinical setting, plasmacytoma/myeloma is favored. Clinical correlation and hematologic evaluation is recommended   03/27/2019 Procedure   Technically successful CT-guided biopsy of sacral soft tissue mass.   04/04/2019 Initial Diagnosis   Multiple myeloma without remission ([place])   04/11/2019 PET scan   IMPRESSION: 1. Previously noted expansile lesions involving the L5 vertebra and sacrum are again noted and exhibit intense FDG uptake compatible with metabolically active disease. A third focus of increased uptake without corresponding CT abnormality is noted within the intertrochanteric portions of the proximal right femur. 2. Small lucent lesion within the T3 vertebra is noted without corresponding FDG uptake. 3. Asymmetric left bladder wall thickening, etiology indeterminate. 4. Aortic Atherosclerosis (ICD10-I70.0). Lad coronary artery calcification.   04/12/2019 Cancer Staging   Staging form: Plasma Cell Myeloma and Plasma Cell Disorders, AJCC 8th Edition - Clinical stage from 04/12/2019: Beta-2-microglobulin (mg/L): 2, Albumin (g/dL): 3.6, ISS: Stage I, High-risk cytogenetics: Unknown, LDH: Unknown - Signed by Heath Lark, MD on 04/12/2019    04/14/2019 Bone Marrow Biopsy   Bone Marrow, Aspirate,Biopsy, and Clot,  right iliac bone - PLASMA CELL MYELOMA.   04/17/2019 - 07/11/2019 Chemotherapy   The patient had bortezemib, revlimid and dexamethasone for chemotherapy treatment.     08/17/2019 Bone Marrow Transplant   He received high dose melphalan followed by autologous stem cell transplant at [facility]   11/22/2019 Bone Marrow Biopsy   BONE MARROW  biopsy at [facility]:       Mild monoclonal plasmacytosis (approximately 2%), kappa light chain restricted.    11/22/2019 PET scan   PET CT at [facility] 1.  Similar size and appearance of mildly hypermetabolic lytic lesion in the L5 vertebral body and posterior elements.  2.  Otherwise, no substantial FDG uptake compared to background bone marrow in the additional osseous lucent lesions.    12/20/2019 - 05/09/2020 Radiation Therapy   Radiation Treatment Dates: 12/20/2019 through 01/08/2020 Site Technique Total Dose (Gy) Dose per Fx (Gy) Completed Fx Beam Energies  Lumbar Spine: Spine 3D 35/35 2.5 14/14 15X        02/29/2020 PET scan   1. Similar size and appearance of expansile lytic lesion in the L5 vertebral body and posterior elements with minimal FDG activity.  2. Otherwise, no additional osseous lucent lesions with FDG uptake above background bone marrow   05/31/2020 -  Chemotherapy   The patient had Revlimid for chemotherapy treatment.       PHYSICAL EXAMINATION: ECOG PERFORMANCE STATUS: 1 - Symptomatic but completely ambulatory  Vitals:   09/15/21 1024  BP: 132/82  Pulse: 76  Resp: 18  Temp: 98.6 F (37 C)  SpO2: 96%   Filed Weights   09/15/21 1024  Weight: 235 lb 12.8 oz (107 kg)    GENERAL:alert, no distress and comfortable SKIN: skin color, texture, turgor are normal, no rashes or significant lesions EYES: normal, Conjunctiva are pink and non-injected, sclera clear OROPHARYNX:no exudate, no erythema and lips, buccal mucosa, and tongue normal  NECK: supple, thyroid normal size, non-tender, without nodularity LYMPH:  no palpable lymphadenopathy in the cervical, axillary or inguinal LUNGS: clear to auscultation and percussion with normal breathing effort HEART: regular rate & rhythm and no murmurs and no lower extremity edema ABDOMEN:abdomen soft, non-tender and normal bowel sounds Musculoskeletal:no cyanosis of digits and no clubbing  NEURO: alert & oriented x 3  with fluent speech, no focal motor/sensory deficits  LABORATORY DATA:  I have reviewed the data as listed    Component Value Date/Time   NA 139 08/04/2021 0954   NA 139 10/11/2018 1115   NA 139 04/05/2014 0849   K 4.1 08/04/2021 0954   K 4.4 04/05/2014 0849   CL 107 08/04/2021 0954   CO2 26 08/04/2021 0954   CO2 23 04/05/2014 0849   GLUCOSE 88 08/04/2021 0954   GLUCOSE 109 04/05/2014 0849   BUN 17 08/04/2021 0954   BUN 23 10/11/2018 1115   BUN 16.8 04/05/2014 0849   CREATININE 0.78 08/04/2021 0954   CREATININE 0.75 03/31/2021 1010   CREATININE 0.91 07/29/2017 1004   CREATININE 0.9 04/05/2014 0849   CALCIUM 8.6 (L) 08/04/2021 0954   CALCIUM 8.7 04/05/2014 0849   PROT 6.4 (L) 08/04/2021 0954   PROT 7.2 10/11/2018 1115   PROT 6.7 04/05/2014 0849   ALBUMIN 3.5 08/04/2021 0954   ALBUMIN 4.7 10/11/2018 1115   ALBUMIN 3.8 04/05/2014 0849   AST 20 08/04/2021 0954   AST 25 03/31/2021 1010   AST 26 04/05/2014 0849   ALT 24 08/04/2021 0954   ALT 33 03/31/2021 1010  ALT 30 04/05/2014 0849   ALKPHOS 74 08/04/2021 0954   ALKPHOS 75 04/05/2014 0849   BILITOT 0.9 08/04/2021 0954   BILITOT 0.9 03/31/2021 1010   BILITOT 0.71 04/05/2014 0849   GFRNONAA >60 08/04/2021 0954   GFRNONAA >60 03/31/2021 1010   GFRAA >60 06/14/2020 0830    No results found for: SPEP, UPEP  Lab Results  Component Value Date   WBC 3.7 (L) 09/15/2021   NEUTROABS 1.8 09/15/2021   HGB 13.0 09/15/2021   HCT 37.8 (L) 09/15/2021   MCV 107.4 (H) 09/15/2021   PLT 128 (L) 09/15/2021      Chemistry      Component Value Date/Time   NA 139 08/04/2021 0954   NA 139 10/11/2018 1115   NA 139 04/05/2014 0849   K 4.1 08/04/2021 0954   K 4.4 04/05/2014 0849   CL 107 08/04/2021 0954   CO2 26 08/04/2021 0954   CO2 23 04/05/2014 0849   BUN 17 08/04/2021 0954   BUN 23 10/11/2018 1115   BUN 16.8 04/05/2014 0849   CREATININE 0.78 08/04/2021 0954   CREATININE 0.75 03/31/2021 1010   CREATININE 0.91 07/29/2017  1004   CREATININE 0.9 04/05/2014 0849   GLU 10 04/10/2014 0000      Component Value Date/Time   CALCIUM 8.6 (L) 08/04/2021 0954   CALCIUM 8.7 04/05/2014 0849   ALKPHOS 74 08/04/2021 0954   ALKPHOS 75 04/05/2014 0849   AST 20 08/04/2021 0954   AST 25 03/31/2021 1010   AST 26 04/05/2014 0849   ALT 24 08/04/2021 0954   ALT 33 03/31/2021 1010   ALT 30 04/05/2014 0849   BILITOT 0.9 08/04/2021 0954   BILITOT 0.9 03/31/2021 1010   BILITOT 0.71 04/05/2014 0849

## 2021-09-16 ENCOUNTER — Emergency Department (HOSPITAL_COMMUNITY): Payer: Medicare Other

## 2021-09-16 ENCOUNTER — Inpatient Hospital Stay (HOSPITAL_COMMUNITY): Payer: Medicare Other

## 2021-09-16 ENCOUNTER — Inpatient Hospital Stay (HOSPITAL_COMMUNITY)
Admission: EM | Admit: 2021-09-16 | Discharge: 2021-09-18 | DRG: 175 | Disposition: A | Discharge status: Home / Self Care | Payer: Medicare Other | Attending: Internal Medicine | Admitting: Internal Medicine

## 2021-09-16 ENCOUNTER — Encounter (HOSPITAL_COMMUNITY): Payer: Self-pay

## 2021-09-16 DIAGNOSIS — Z23 Encounter for immunization: Secondary | ICD-10-CM | POA: Diagnosis not present

## 2021-09-16 DIAGNOSIS — T451X5A Adverse effect of antineoplastic and immunosuppressive drugs, initial encounter: Secondary | ICD-10-CM | POA: Diagnosis present

## 2021-09-16 DIAGNOSIS — C9000 Multiple myeloma not having achieved remission: Secondary | ICD-10-CM | POA: Diagnosis present

## 2021-09-16 DIAGNOSIS — R7401 Elevation of levels of liver transaminase levels: Secondary | ICD-10-CM | POA: Diagnosis present

## 2021-09-16 DIAGNOSIS — R17 Unspecified jaundice: Secondary | ICD-10-CM | POA: Diagnosis present

## 2021-09-16 DIAGNOSIS — R079 Chest pain, unspecified: Secondary | ICD-10-CM

## 2021-09-16 DIAGNOSIS — E876 Hypokalemia: Secondary | ICD-10-CM | POA: Diagnosis present

## 2021-09-16 DIAGNOSIS — Z803 Family history of malignant neoplasm of breast: Secondary | ICD-10-CM

## 2021-09-16 DIAGNOSIS — J9811 Atelectasis: Secondary | ICD-10-CM | POA: Diagnosis not present

## 2021-09-16 DIAGNOSIS — I517 Cardiomegaly: Secondary | ICD-10-CM | POA: Diagnosis not present

## 2021-09-16 DIAGNOSIS — I2693 Single subsegmental pulmonary embolism without acute cor pulmonale: Secondary | ICD-10-CM

## 2021-09-16 DIAGNOSIS — R0789 Other chest pain: Secondary | ICD-10-CM | POA: Diagnosis not present

## 2021-09-16 DIAGNOSIS — M7918 Myalgia, other site: Secondary | ICD-10-CM | POA: Diagnosis present

## 2021-09-16 DIAGNOSIS — I2699 Other pulmonary embolism without acute cor pulmonale: Principal | ICD-10-CM | POA: Diagnosis present

## 2021-09-16 DIAGNOSIS — I2602 Saddle embolus of pulmonary artery with acute cor pulmonale: Secondary | ICD-10-CM | POA: Diagnosis not present

## 2021-09-16 DIAGNOSIS — Z9481 Bone marrow transplant status: Secondary | ICD-10-CM

## 2021-09-16 DIAGNOSIS — E871 Hypo-osmolality and hyponatremia: Secondary | ICD-10-CM | POA: Diagnosis present

## 2021-09-16 DIAGNOSIS — Z6837 Body mass index (BMI) 37.0-37.9, adult: Secondary | ICD-10-CM | POA: Diagnosis not present

## 2021-09-16 DIAGNOSIS — I2609 Other pulmonary embolism with acute cor pulmonale: Secondary | ICD-10-CM | POA: Diagnosis not present

## 2021-09-16 DIAGNOSIS — G8929 Other chronic pain: Secondary | ICD-10-CM | POA: Diagnosis present

## 2021-09-16 DIAGNOSIS — I10 Essential (primary) hypertension: Secondary | ICD-10-CM | POA: Diagnosis present

## 2021-09-16 DIAGNOSIS — D61810 Antineoplastic chemotherapy induced pancytopenia: Secondary | ICD-10-CM | POA: Diagnosis present

## 2021-09-16 DIAGNOSIS — Z96652 Presence of left artificial knee joint: Secondary | ICD-10-CM | POA: Diagnosis present

## 2021-09-16 DIAGNOSIS — I82452 Acute embolism and thrombosis of left peroneal vein: Secondary | ICD-10-CM | POA: Diagnosis present

## 2021-09-16 DIAGNOSIS — K449 Diaphragmatic hernia without obstruction or gangrene: Secondary | ICD-10-CM | POA: Diagnosis not present

## 2021-09-16 DIAGNOSIS — Z20822 Contact with and (suspected) exposure to covid-19: Secondary | ICD-10-CM | POA: Diagnosis present

## 2021-09-16 DIAGNOSIS — R0602 Shortness of breath: Secondary | ICD-10-CM | POA: Diagnosis not present

## 2021-09-16 DIAGNOSIS — Z66 Do not resuscitate: Secondary | ICD-10-CM | POA: Diagnosis present

## 2021-09-16 DIAGNOSIS — D61818 Other pancytopenia: Secondary | ICD-10-CM | POA: Diagnosis not present

## 2021-09-16 DIAGNOSIS — E669 Obesity, unspecified: Secondary | ICD-10-CM | POA: Diagnosis present

## 2021-09-16 DIAGNOSIS — N400 Enlarged prostate without lower urinary tract symptoms: Secondary | ICD-10-CM | POA: Diagnosis present

## 2021-09-16 DIAGNOSIS — R739 Hyperglycemia, unspecified: Secondary | ICD-10-CM | POA: Diagnosis present

## 2021-09-16 DIAGNOSIS — I82402 Acute embolism and thrombosis of unspecified deep veins of left lower extremity: Secondary | ICD-10-CM | POA: Diagnosis not present

## 2021-09-16 LAB — CBC
HCT: 41.1 % (ref 39.0–52.0)
Hemoglobin: 13.9 g/dL (ref 13.0–17.0)
MCH: 37.3 pg — ABNORMAL HIGH (ref 26.0–34.0)
MCHC: 33.8 g/dL (ref 30.0–36.0)
MCV: 110.2 fL — ABNORMAL HIGH (ref 80.0–100.0)
Platelets: 118 10*3/uL — ABNORMAL LOW (ref 150–400)
RBC: 3.73 MIL/uL — ABNORMAL LOW (ref 4.22–5.81)
RDW: 13.8 % (ref 11.5–15.5)
WBC: 4.6 10*3/uL (ref 4.0–10.5)
nRBC: 0 % (ref 0.0–0.2)

## 2021-09-16 LAB — BASIC METABOLIC PANEL
Anion gap: 10 (ref 5–15)
BUN: 26 mg/dL — ABNORMAL HIGH (ref 8–23)
CO2: 24 mmol/L (ref 22–32)
Calcium: 8.6 mg/dL — ABNORMAL LOW (ref 8.9–10.3)
Chloride: 102 mmol/L (ref 98–111)
Creatinine, Ser: 0.92 mg/dL (ref 0.61–1.24)
GFR, Estimated: >60 mL/min (ref >60)
Glucose, Bld: 150 mg/dL — ABNORMAL HIGH (ref 70–99)
Potassium: 3.5 mmol/L (ref 3.5–5.1)
Sodium: 136 mmol/L (ref 135–145)

## 2021-09-16 LAB — RESP PANEL BY RT-PCR (FLU A&B, COVID) ARPGX2
Influenza A by PCR: NEGATIVE
Influenza B by PCR: NEGATIVE
SARS Coronavirus 2 by RT PCR: NEGATIVE

## 2021-09-16 LAB — HEPARIN LEVEL (UNFRACTIONATED)
Heparin Unfractionated: >1.1 IU/mL — ABNORMAL HIGH (ref 0.30–0.70)
Heparin Unfractionated: 0.36 IU/mL (ref 0.30–0.70)

## 2021-09-16 LAB — TROPONIN I (HIGH SENSITIVITY)
Troponin I (High Sensitivity): 33 ng/L — ABNORMAL HIGH (ref <18)
Troponin I (High Sensitivity): 43 ng/L — ABNORMAL HIGH (ref <18)
Troponin I (High Sensitivity): 46 ng/L — ABNORMAL HIGH (ref <18)

## 2021-09-16 LAB — KAPPA/LAMBDA LIGHT CHAINS
Kappa free light chain: 38.4 mg/L — ABNORMAL HIGH (ref 3.3–19.4)
Kappa, lambda light chain ratio: 1.63 (ref 0.26–1.65)
Lambda free light chains: 23.6 mg/L (ref 5.7–26.3)

## 2021-09-16 LAB — MRSA NEXT GEN BY PCR, NASAL: MRSA by PCR Next Gen: NOT DETECTED

## 2021-09-16 LAB — BRAIN NATRIURETIC PEPTIDE: B Natriuretic Peptide: 270.8 pg/mL — ABNORMAL HIGH (ref 0.0–100.0)

## 2021-09-16 LAB — HIV ANTIBODY (ROUTINE TESTING W REFLEX): HIV Screen 4th Generation wRfx: NONREACTIVE

## 2021-09-16 MED ORDER — CALCIUM CARBONATE ANTACID 500 MG PO CHEW
1.0000 | CHEWABLE_TABLET | Freq: Every day | ORAL | Status: DC | PRN
Start: 2021-09-16 — End: 2021-09-18

## 2021-09-16 MED ORDER — VITAMIN D 25 MCG (1000 UNIT) PO TABS
2000.0000 [IU] | ORAL_TABLET | Freq: Every day | ORAL | Status: DC
  Administered 2021-09-17 – 2021-09-18 (×2): 2000 [IU] via ORAL
  Filled 2021-09-16 (×2): qty 2

## 2021-09-16 MED ORDER — ACYCLOVIR 400 MG PO TABS
400.0000 mg | ORAL_TABLET | Freq: Every day | ORAL | Status: DC
  Administered 2021-09-16 – 2021-09-18 (×3): 400 mg via ORAL
  Filled 2021-09-16 (×3): qty 1

## 2021-09-16 MED ORDER — ONDANSETRON HCL 4 MG/2ML IJ SOLN
4.0000 mg | Freq: Four times a day (QID) | INTRAMUSCULAR | Status: DC | PRN
  Administered 2021-09-16: 4 mg via INTRAVENOUS
  Filled 2021-09-16: qty 2

## 2021-09-16 MED ORDER — VITAMIN D 50 MCG (2000 UT) PO TABS: 2000.0000 [IU] | ORAL_TABLET | Freq: Every day | ORAL | Status: DC

## 2021-09-16 MED ORDER — POLYETHYL GLYCOL-PROPYL GLYCOL 0.4-0.3 % OP GEL: Freq: Every day | OPHTHALMIC | Status: DC | PRN

## 2021-09-16 MED ORDER — ADULT MULTIVITAMIN W/MINERALS CH
1.0000 | ORAL_TABLET | Freq: Every day | ORAL | Status: DC
Start: 2021-09-17 — End: 2021-09-18
  Administered 2021-09-17 – 2021-09-18 (×2): 1 via ORAL
  Filled 2021-09-16 (×2): qty 1

## 2021-09-16 MED ORDER — MULTI-VITAMIN PO TABS: 1.0000 | ORAL_TABLET | Freq: Every day | ORAL | Status: DC

## 2021-09-16 MED ORDER — HYDROCHLOROTHIAZIDE 12.5 MG PO TABS
12.5000 mg | ORAL_TABLET | Freq: Every day | ORAL | Status: DC
  Administered 2021-09-17 – 2021-09-18 (×2): 12.5 mg via ORAL
  Filled 2021-09-16 (×2): qty 1

## 2021-09-16 MED ORDER — FOLIC ACID 1 MG PO TABS
0.5000 mg | ORAL_TABLET | Freq: Every day | ORAL | Status: DC
  Administered 2021-09-17 – 2021-09-18 (×2): 0.5 mg via ORAL
  Filled 2021-09-16 (×2): qty 1

## 2021-09-16 MED ORDER — VITAMIN B-12 1000 MCG PO TABS
1000.0000 ug | ORAL_TABLET | Freq: Every day | ORAL | Status: DC
Start: 2021-09-17 — End: 2021-09-18
  Administered 2021-09-17 – 2021-09-18 (×2): 1000 ug via ORAL
  Filled 2021-09-16 (×2): qty 1

## 2021-09-16 MED ORDER — MORPHINE SULFATE (PF) 2 MG/ML IV SOLN
2.0000 mg | INTRAVENOUS | Status: DC | PRN
  Administered 2021-09-16 (×3): 2 mg via INTRAVENOUS
  Filled 2021-09-16 (×3): qty 1

## 2021-09-16 MED ORDER — FOLIC ACID 400 MCG PO TABS: 400.0000 ug | ORAL_TABLET | Freq: Every day | ORAL | Status: DC

## 2021-09-16 MED ORDER — IOHEXOL 350 MG/ML SOLN
80.0000 mL | Freq: Once | INTRAVENOUS | Status: AC | PRN
  Administered 2021-09-16: 80 mL via INTRAVENOUS

## 2021-09-16 MED ORDER — POLYVINYL ALCOHOL 1.4 % OP SOLN
1.0000 [drp] | Freq: Every day | OPHTHALMIC | Status: DC | PRN
  Filled 2021-09-16: qty 15

## 2021-09-16 MED ORDER — LOSARTAN POTASSIUM-HCTZ 100-12.5 MG PO TABS: 1.0000 | ORAL_TABLET | Freq: Every day | ORAL | Status: DC

## 2021-09-16 MED ORDER — OXYCODONE HCL 5 MG PO TABS: 5.0000 mg | ORAL_TABLET | ORAL | Status: DC | PRN

## 2021-09-16 MED ORDER — SODIUM CHLORIDE (PF) 0.9 % IJ SOLN
INTRAMUSCULAR | Status: AC
  Filled 2021-09-16: qty 50

## 2021-09-16 MED ORDER — ONDANSETRON HCL 4 MG PO TABS: 4.0000 mg | ORAL_TABLET | Freq: Four times a day (QID) | ORAL | Status: DC | PRN

## 2021-09-16 MED ORDER — HEPARIN (PORCINE) 25000 UT/250ML-% IV SOLN
1700.0000 [IU]/h | INTRAVENOUS | Status: DC
  Administered 2021-09-16 (×2): 1550 [IU]/h via INTRAVENOUS
  Administered 2021-09-17: 14:00:00 1700 [IU]/h via INTRAVENOUS
  Filled 2021-09-16 (×4): qty 250

## 2021-09-16 MED ORDER — ACETAMINOPHEN 325 MG PO TABS: 650.0000 mg | ORAL_TABLET | Freq: Four times a day (QID) | ORAL | Status: DC | PRN

## 2021-09-16 MED ORDER — SODIUM CHLORIDE 0.9 % IV BOLUS
500.0000 mL | Freq: Once | INTRAVENOUS | Status: AC
  Administered 2021-09-16: 500 mL via INTRAVENOUS

## 2021-09-16 MED ORDER — HYDROCODONE-ACETAMINOPHEN 5-325 MG PO TABS
1.0000 | ORAL_TABLET | Freq: Once | ORAL | Status: AC
  Administered 2021-09-16: 1 via ORAL
  Filled 2021-09-16: qty 1

## 2021-09-16 MED ORDER — HEPARIN BOLUS VIA INFUSION
6000.0000 [IU] | Freq: Once | INTRAVENOUS | Status: AC
  Administered 2021-09-16: 6000 [IU] via INTRAVENOUS
  Filled 2021-09-16: qty 6000

## 2021-09-16 MED ORDER — LOSARTAN POTASSIUM 50 MG PO TABS
100.0000 mg | ORAL_TABLET | Freq: Every day | ORAL | Status: DC
  Administered 2021-09-17 – 2021-09-18 (×2): 100 mg via ORAL
  Filled 2021-09-16 (×2): qty 2

## 2021-09-16 MED ORDER — CHLORHEXIDINE GLUCONATE CLOTH 2 % EX PADS
6.0000 | MEDICATED_PAD | Freq: Every day | CUTANEOUS | Status: DC
  Administered 2021-09-17: 14:00:00 6 via TOPICAL

## 2021-09-16 MED ORDER — ACETAMINOPHEN 650 MG RE SUPP: 650.0000 mg | Freq: Four times a day (QID) | RECTAL | Status: DC | PRN

## 2021-09-16 NOTE — ED Provider Notes (Signed)
  Physical Exam  BP 120/75   Pulse 99   Temp 98.4 F (36.9 C) (Oral)   Resp 18   Ht 5' 7.5" (1.715 m)   Wt 107 kg   SpO2 96%   BMI 36.42 kg/m   Physical Exam Constitutional:      General: He is not in acute distress.    Appearance: Normal appearance.  HENT:     Head: Normocephalic and atraumatic.     Nose: No congestion or rhinorrhea.  Eyes:     General:        Right eye: No discharge.        Left eye: No discharge.     Extraocular Movements: Extraocular movements intact.     Pupils: Pupils are equal, round, and reactive to light.  Cardiovascular:     Rate and Rhythm: Normal rate and regular rhythm.     Heart sounds: No murmur heard. Pulmonary:     Effort: No respiratory distress.     Breath sounds: No wheezing or rales.  Abdominal:     General: There is no distension.     Tenderness: There is no abdominal tenderness.  Musculoskeletal:        General: Normal range of motion.     Cervical back: Normal range of motion.  Skin:    General: Skin is warm and dry.  Neurological:     General: No focal deficit present.     Mental Status: He is alert.    ED Course/Procedures     Procedures  MDM  Patient received in handoff.  Arrives with chest pain and shortness of breath.  Laboratory evaluation with uptrending but mildly elevated [place] likely type II demand ischemia.  CT PE with large right-sided PE.  On reevaluation, patient with no significant hypoxia, no new oxygen requirement.  Heparin started and patient admitted.       Teressa Lower, MD 09/16/21 [phone]

## 2021-09-16 NOTE — ED Notes (Signed)
Save blue tube in main lab °

## 2021-09-16 NOTE — ED Notes (Signed)
Pt resting comfortable in bed at this time. All comfort and safety measures in place in room with bed in lowest position.   ?

## 2021-09-16 NOTE — Consult Note (Addendum)
NAME:  Jesse Schlosser., MRN:  [id number], DOB:  November 04, 1950, LOS: 0 ADMISSION DATE:  09/16/2021, CONSULTATION DATE:  09/16/2021 REFERRING MD:  Dr. Marylyn Ishihara, CHIEF COMPLAINT:  PE, chest pain    History of Present Illness:  70 yo M, PMH of HTN, HLD, MM, follows with medical oncology. He was at the cancer center receiving and infusion. He developed chest pain yesterday evening and presented to the ED for evaluation.  In the emergency department the patient was evaluated and underwent CTA chest.  This revealed segmental pulmonary embolism.  The patient had some evidence of right ventricular strain with a RV LV ratio of 0.9.  Patient of note had a recent knee surgery with a knee replacement in October of this past year.  Otherwise also undergoing treatments for multiple myeloma.  No other history of PE.  Pertinent  Medical History   Past Medical History:  Diagnosis Date   BPH (benign prostatic hypertrophy)    Degenerative disc disease    Diverticulosis    Hemochromatosis    HTN (hypertension)    Hyperlipidemia    Multiple myeloma (HCC)    Polio    Status post Nissen fundoplication (without gastrostomy tube) procedure    for hiatal hernia.     Significant Hospital Events: Including procedures, antibiotic start and stop dates in addition to other pertinent events     Interim History / Subjective:  Awake alert following commands.  No focal deficit.  Vital signs stable except for tachycardia.  Objective   Blood pressure 123/74, pulse (!) 102, temperature 98.4 F (36.9 C), temperature source Oral, resp. rate 18, height 5' 7.5" (1.715 m), weight 107 kg, SpO2 94 %.        Intake/Output Summary (Last 24 hours) at 09/16/2021 0944 Last data filed at 09/16/2021 0720 Gross per 24 hour  Intake 491.67 ml  Output --  Net 491.67 ml   Filed Weights   09/16/21 0306  Weight: 107 kg    Examination: General: Obese elderly male sitting up in bed.  Conversant. HENT: NCAT, tracking  appropriately Lungs: Clear to auscultation bilaterally no crackles no wheeze Cardiovascular: Tachycardic, S1-S2, sinus Abdomen: Obese, soft nontender nondistended Extremities: No significant edema Neuro: Alert oriented following commands no focal deficit GU: Deferred  CT chest 09/16/2021: Acute nonocclusive right upper lobe and lower lobe segmental pulmonary emboli with evidence of right ventricular strain. The patient's images have been independently reviewed by me.    Resolved Hospital Problem list     Assessment & Plan:   Right upper and lower lobe segmental pulmonary emboli Submassive PE with evidence of right ventricular strain Hemodynamically stable Recent knee surgery in October Multiple myeloma, currently undergoing therapy Hypercoagulable state. Plan: Continue heparin infusion for anticoagulation Agree with obtaining lower extremity duplex and echocardiogram. No indication at this time for systemic lysis or catheter directed lysis. If patient has further decline to include hemodynamic instability or increasing oxygen requirements please contact us and we will reconsider for any additional intervention that may be needed. I did explain this to the patient and patient's husband at bedside. We appreciate the consultation. Agree with admission to stepdown unit. Patient can discuss longevity of anticoagulation with his hematologist.  As he does have multiple myeloma.  It is likely he may need lifelong anticoagulation.  CCM will sign off. Please call us back if needed.   Labs   CBC: Recent Labs  Lab 09/15/21 1007 09/16/21 0306  WBC 3.7* 4.6  NEUTROABS 1.8  --  HGB 13.0 13.9  HCT 37.8* 41.1  MCV 107.4* 110.2*  PLT 128* 118*    Basic Metabolic Panel: Recent Labs  Lab 09/15/21 1007 09/16/21 0306  NA 139 136  K 3.6 3.5  CL 105 102  CO2 25 24  GLUCOSE 117* 150*  BUN 16 26*  CREATININE 0.78 0.92  CALCIUM 8.4* 8.6*   GFR: Estimated Creatinine Clearance (by  C-G formula based on SCr of 0.92 mg/dL) Male: 72.3 mL/min Male: 87.9 mL/min Recent Labs  Lab 09/15/21 1007 09/16/21 0306  WBC 3.7* 4.6    Liver Function Tests: Recent Labs  Lab 09/15/21 1007  AST 20  ALT 27  ALKPHOS 65  BILITOT 1.1  PROT 6.4*  ALBUMIN 3.5   No results for input(s): LIPASE, AMYLASE in the last 168 hours. No results for input(s): AMMONIA in the last 168 hours.  ABG No results found for: PHART, PCO2ART, PO2ART, HCO3, TCO2, ACIDBASEDEF, O2SAT   Coagulation Profile: No results for input(s): INR, PROTIME in the last 168 hours.  Cardiac Enzymes: No results for input(s): CKTOTAL, CKMB, CKMBINDEX, TROPONINI in the last 168 hours.  HbA1C: Hemoglobin A1C  Date/Time Value Ref Range Status  05/20/2021 01:17 PM 5.1 4.0 - 5.6 % Final  02/17/2018 10:55 AM 5.3  Final    CBG: No results for input(s): GLUCAP in the last 168 hours.  Review of Systems:   Review of Systems  Constitutional:  Negative for chills, fever, malaise/fatigue and weight loss.  HENT:  Negative for hearing loss, sore throat and tinnitus.   Eyes:  Negative for blurred vision and double vision.  Respiratory:  Positive for shortness of breath. Negative for cough, hemoptysis, sputum production, wheezing and stridor.   Cardiovascular:  Positive for chest pain. Negative for palpitations, orthopnea, leg swelling and PND.  Gastrointestinal:  Negative for abdominal pain, constipation, diarrhea, heartburn, nausea and vomiting.  Genitourinary:  Negative for dysuria, hematuria and urgency.  Musculoskeletal:  Negative for joint pain and myalgias.  Skin:  Negative for itching and rash.  Neurological:  Negative for dizziness, tingling, weakness and headaches.  Endo/Heme/Allergies:  Negative for environmental allergies. Does not bruise/bleed easily.  Psychiatric/Behavioral:  Negative for depression. The patient is not nervous/anxious and does not have insomnia.   All other systems reviewed and are  negative.   Past Medical History:  He,  has a past medical history of BPH (benign prostatic hypertrophy), Degenerative disc disease, Diverticulosis, Hemochromatosis, HTN (hypertension), Hyperlipidemia, Multiple myeloma ([place]), Polio, and Status post Nissen fundoplication (without gastrostomy tube) procedure.   Surgical History:   Past Surgical History:  Procedure Laterality Date   FOOT SURGERY     HIATAL HERNIA REPAIR     OTHER SURGICAL HISTORY     Cataract Surgery--Both eyes   TOTAL KNEE ARTHROPLASTY Left 07/15/2021   Procedure: TOTAL KNEE ARTHROPLASTY;  Surgeon: Marchia Bond, MD;  Location: [facility];  Service: Orthopedics;  Laterality: Left;   Undescended testes Right 1968     Social History:   reports that he has never smoked. He has never used smokeless tobacco. He reports current alcohol use of about 4.0 standard drinks per week. He reports that he does not use drugs.   Family History:  His family history includes Breast cancer in his mother and sister.   Allergies No Known Allergies   Home Medications  Prior to Admission medications   Medication Sig Start Date End Date Taking? Authorizing Provider  acetaminophen (TYLENOL) 650 MG CR tablet Take 650 mg by mouth every  8 (eight) hours as needed for pain.   Yes [provider]  acyclovir (ZOVIRAX) 400 MG tablet Take 1 tablet (400 mg total) by mouth 2 (two) times daily. Patient taking differently: Take 400 mg by mouth daily. 01/07/21  Yes Gorsuch, Ni, MD  calcium carbonate (TUMS - DOSED IN MG ELEMENTAL CALCIUM) 500 MG chewable tablet Chew 1-2 tablets by mouth daily as needed for heartburn.   Yes [provider]  Cholecalciferol (VITAMIN D) 50 MCG (2000 UT) tablet Take 2,000 Units by mouth daily.   Yes [provider]  folic acid (FOLVITE) 340 MCG tablet Take 400 mcg by mouth daily.   Yes [provider]  lenalidomide (REVLIMID) 15 MG capsule Take 1 capsule (15 mg total) by mouth daily. Take 1  capsule for 21 days, then stop for 7 days for a cycle of every 28 days 08/26/21  Yes Gorsuch, Ni, MD  losartan-hydrochlorothiazide (HYZAAR) 100-12.5 MG tablet Take 1 tablet by mouth daily. 12/20/20  Yes Denita Lung, MD  Multiple Vitamin (MULTI-VITAMIN) tablet Take 1 tablet by mouth daily. 08/30/19  Yes [provider]  oxyCODONE (ROXICODONE) 5 MG immediate release tablet Take 1 tablet (5 mg total) by mouth every 4 (four) hours as needed for severe pain. 09/09/21  Yes Gorsuch, Ernst Spell, MD  Polyethyl Glycol-Propyl Glycol (SYSTANE OP) Place 1 drop into both eyes daily as needed (dry eyes).   Yes [provider]  vitamin B-12 (CYANOCOBALAMIN) 1000 MCG tablet Take 1,000 mcg by mouth daily.   Yes [provider]      Garner Nash, DO [facility] 09/16/2021 10:49 AM

## 2021-09-16 NOTE — Progress Notes (Signed)
[place] for IV heparin Indication: pulmonary embolus  No Known Allergies  Patient Measurements: Height: 5' 7.5" (171.5 cm) Weight: 107 kg (236 lb) IBW/kg (Calculated) : 67.25 Heparin Dosing Weight: 91 kg  Vital Signs: Temp: 98.4 F (36.9 C) (12/13 0306) Temp Source: Oral (12/13 0306) BP: 120/75 (12/13 0830) Pulse Rate: 99 (12/13 0830)  Labs: Recent Labs    09/15/21 1007 09/16/21 0306 09/16/21 0613  HGB 13.0 13.9  --   HCT 37.8* 41.1  --   PLT 128* 118*  --   CREATININE 0.78 0.92  --   TROPONINIHS  --  33* 43*    Estimated Creatinine Clearance (by C-G formula based on SCr of 0.92 mg/dL) Male: 72.3 mL/min Male: 87.9 mL/min   Medical History: Past Medical History:  Diagnosis Date   BPH (benign prostatic hypertrophy)    Degenerative disc disease    Diverticulosis    Hemochromatosis    HTN (hypertension)    Hyperlipidemia    Multiple myeloma (HCC)    Polio    Status post Nissen fundoplication (without gastrostomy tube) procedure    for hiatal hernia.    Medications:  (Not in a hospital admission)  Scheduled:   heparin  6,000 Units Intravenous Once   sodium chloride (PF)        Assessment: 12 yoM with PMH MM on lenalidomide, recent TKA in October, presents with sudden-onset SOB. CTA reveals multiple right-sided pulmonary emboli with RH strain present. Pharmacy consulted for IV heparin.  Baseline INR, aPTT: not done Prior anticoagulation: none  Significant events:  Today, 09/16/2021: CBC: Hgb WNL; Plt slightly low; recentbaseline appears to be ~150k No bleeding or infusion issues per nursing SCr WNL  Goal of Therapy: Heparin level 0.3-0.7 units/ml Monitor platelets by anticoagulation protocol: Yes  Plan: Heparin 6000 units IV bolus x 1 Heparin 1550 units/hr IV infusion Check heparin level 8 hrs after start Daily CBC, daily heparin level once stable Monitor for signs of bleeding or thrombosis  Reuel Boom, PharmD, BCPS [phone] 09/16/2021, 8:42 AM

## 2021-09-16 NOTE — ED Notes (Signed)
Patient transported to CT 

## 2021-09-16 NOTE — ED Notes (Signed)
Patient returned back from CT at this time.  °

## 2021-09-16 NOTE — Progress Notes (Addendum)
HEMATOLOGY-ONCOLOGY PROGRESS NOTE ASSESSMENT & PLAN:   I have seen him, examined him and agree with documentation as follows Multiple myeloma without remission ([place]) He is recovering well from recent knee surgery Pancytopenia is stable Myeloma panel is still pending but based on his last set of myeloma panel, he has partial response Due to new acute PE, will need to discontinue Revlimid Continue supportive care for now I plan to see him back within a month in the outpatient clinic to discuss next step   S/P bone marrow transplant [facility]) He declined going back to [facility] MMR vaccine was given in our office 09/15/2021  Acute submassive PE Due to immobility from recent knee surgery and Revlimid Echocardiogram and Doppler ultrasounds pending Discontinue Revlimid Currently on a heparin drip and will consider switching him to [place] if stable over the next 24 to 48 hours   Pancytopenia, acquired ([place]) He has mild pancytopenia due to surgery He is not symptomatic His last B12 level was adequate Observe closely for now   Obesity (BMI 30-39.9) We discussed the importance of weight loss and healthy living He appears motivated to lose some weight after the holidays   Chronic musculoskeletal pain He had recent exacerbation of back pain likely due to changes in his gait after knee surgery He was prescribed oxycodone with relief He is trying to take less and went back to ibuprofen recently He is still undergoing physical therapy and rehab I recommend the patient to discuss this with physical therapy to reassess his gait  Heath Lark, MD  SUBJECTIVE: [name] is followed by our office for multiple myeloma He is currently on Revlimid Now admitted with acute submassive PE He has been started on heparin He reports back pain today but no significant chest pain or shortness of breath at this time He had left TKR in October 2022 He reports intermittent swelling to his left leg Last  night, he woke up with severe chest pain, worrisome for heart attack and presented to the emergency department He took 325 mg aspirin at home and felt better  Oncology History  Multiple myeloma without remission ([place])  03/20/2019 Imaging   1. Tumor involving the L5 and S1 and S2 segments of the spine as described with mass effects upon the left L4, L5, S1 and more distal left sacral nerves as described above. 2. Does the patient have a history of malignancy? 3. Multiple plasmacytomas and metastatic disease could give this appearance   03/27/2019 Pathology Results   Soft Tissue Needle Core Biopsy, sacral mass - PLASMABLASTIC NEOPLASM. - SEE COMMENT. Microscopic Comment The sections show needle core biopsy fragments of soft tissue densely infiltrated by a relatively monomorphic infiltrate of atypical plasmacytoid cells characterized by vesicular or partially clumped chromatin and prominent nucleoli. This is associated with scattered mitosis. A battery of immunohistochemical stains was performed and show that the atypical plasmacytoid cells are positive for CD138, CD43 and cytoplasmic kappa. There is also weak positivity for CD56 and partial variable positivity for cyclin D1. The atypical plasmacytoid cells are negative for cytoplasmic lambda, CD10, PAX5, CD79a, CD20, CD3, CD5, CD34, EBV (ISH) and mostly negative for LCA. The overall morphologic and histologic features are most compatible with a plasmablastic neoplasm. The differential diagnosis includes plasmablastic lymphoma and plasmablastic plasmacytoma/myeloma. Based on the overall phenotypic features and the clinical setting, plasmacytoma/myeloma is favored. Clinical correlation and hematologic evaluation is recommended   03/27/2019 Procedure   Technically successful CT-guided biopsy of sacral soft tissue mass.  04/04/2019 Initial Diagnosis   Multiple myeloma without remission ([place])   04/11/2019 PET scan   IMPRESSION: 1. Previously noted  expansile lesions involving the L5 vertebra and sacrum are again noted and exhibit intense FDG uptake compatible with metabolically active disease. A third focus of increased uptake without corresponding CT abnormality is noted within the intertrochanteric portions of the proximal right femur. 2. Small lucent lesion within the T3 vertebra is noted without corresponding FDG uptake. 3. Asymmetric left bladder wall thickening, etiology indeterminate. 4. Aortic Atherosclerosis (ICD10-I70.0). Lad coronary artery calcification.   04/12/2019 Cancer Staging   Staging form: Plasma Cell Myeloma and Plasma Cell Disorders, AJCC 8th Edition - Clinical stage from 04/12/2019: Beta-2-microglobulin (mg/L): 2, Albumin (g/dL): 3.6, ISS: Stage I, High-risk cytogenetics: Unknown, LDH: Unknown - Signed by Heath Lark, MD on 04/12/2019    04/14/2019 Bone Marrow Biopsy   Bone Marrow, Aspirate,Biopsy, and Clot, right iliac bone - PLASMA CELL MYELOMA.   04/17/2019 - 07/11/2019 Chemotherapy   The patient had bortezemib, revlimid and dexamethasone for chemotherapy treatment.     08/17/2019 Bone Marrow Transplant   He received high dose melphalan followed by autologous stem cell transplant at [facility]   11/22/2019 Bone Marrow Biopsy   BONE MARROW biopsy at [facility]:       Mild monoclonal plasmacytosis (approximately 2%), kappa light chain restricted.    11/22/2019 PET scan   PET CT at [facility] 1.  Similar size and appearance of mildly hypermetabolic lytic lesion in the L5 vertebral body and posterior elements.  2.  Otherwise, no substantial FDG uptake compared to background bone marrow in the additional osseous lucent lesions.    12/20/2019 - 05/09/2020 Radiation Therapy   Radiation Treatment Dates: 12/20/2019 through 01/08/2020 Site Technique Total Dose (Gy) Dose per Fx (Gy) Completed Fx Beam Energies  Lumbar Spine: Spine 3D 35/35 2.5 14/14 15X        02/29/2020 PET scan   1. Similar size and appearance of  expansile lytic lesion in the L5 vertebral body and posterior elements with minimal FDG activity.  2. Otherwise, no additional osseous lucent lesions with FDG uptake above background bone marrow   05/31/2020 -  Chemotherapy   The patient had Revlimid for chemotherapy treatment.        REVIEW OF SYSTEMS:   Constitutional: Denies fevers, chills  Eyes: Denies blurriness of vision Ears, nose, mouth, throat, and face: Denies mucositis or sore throat Respiratory: Denies cough, dyspnea or wheezes Gastrointestinal:  Denies nausea, heartburn or change in bowel habits Skin: Denies abnormal skin rashes Lymphatics: Denies new lymphadenopathy or easy bruising Neurological:Denies numbness, tingling or new weaknesses Behavioral/Psych: Mood is stable, no new changes  Extremities: Intermittent swelling to left leg All other systems were reviewed with the patient and are negative.  I have reviewed the past medical history, past surgical history, social history and family history with the patient and they are unchanged from previous note.   PHYSICAL EXAMINATION: ECOG PERFORMANCE STATUS: 1 - Symptomatic but completely ambulatory  Vitals:   09/16/21 0830 09/16/21 0903  BP: 120/75 123/74  Pulse: 99 (!) 102  Resp: 18 18  Temp:    SpO2: 96% 94%   Filed Weights   09/16/21 0306  Weight: 107 kg    Intake/Output from previous day: No intake/output data recorded.  GENERAL:alert, no distress and comfortable SKIN: skin color, texture, turgor are normal, no rashes or significant lesions EYES: normal, Conjunctiva are pink and non-injected, sclera clear OROPHARYNX:no exudate, no erythema and  lips, buccal mucosa, and tongue normal  LUNGS: clear to auscultation and percussion with normal breathing effort HEART: regular rate & rhythm and no murmurs and 1+ edema to left lower extremity ABDOMEN:abdomen soft, non-tender and normal bowel sounds NEURO: alert & oriented x 3 with fluent speech, no focal  motor/sensory deficits  LABORATORY DATA:  I have reviewed the data as listed CMP Latest Ref Rng & Units 09/16/2021 09/15/2021 08/04/2021  Glucose 70 - 99 mg/dL 150(H) 117(H) 88  BUN 8 - 23 mg/dL 26(H) 16 17  Creatinine 0.61 - 1.24 mg/dL 0.92 0.78 0.78  Sodium 135 - 145 mmol/L 136 139 139  Potassium 3.5 - 5.1 mmol/L 3.5 3.6 4.1  Chloride 98 - 111 mmol/L 102 105 107  CO2 22 - 32 mmol/L '24 25 26  ' Calcium 8.9 - 10.3 mg/dL 8.6(L) 8.4(L) 8.6(L)  Total Protein 6.5 - 8.1 g/dL - 6.4(L) 6.4(L)  Total Bilirubin 0.3 - 1.2 mg/dL - 1.1 0.9  Alkaline Phos 38 - 126 U/L - 65 74  AST 15 - 41 U/L - 20 20  ALT 0 - 44 U/L - 27 24    Lab Results  Component Value Date   WBC 4.6 09/16/2021   HGB 13.9 09/16/2021   HCT 41.1 09/16/2021   MCV 110.2 (H) 09/16/2021   PLT 118 (L) 09/16/2021   NEUTROABS 1.8 09/15/2021   I have personally reviewed his CT imaging DG Chest 2 View  Result Date: 09/16/2021 CLINICAL DATA:  Chest pain EXAM: CHEST - 2 VIEW COMPARISON:  None. FINDINGS: Lungs are clear.  No pleural effusion or pneumothorax. The heart is normal in size. Degenerative changes of the visualized thoracolumbar spine. IMPRESSION: Normal chest radiographs. Electronically Signed   By: Julian Hy M.D.   On: 09/16/2021 03:19   CT Angio Chest Pulmonary Embolism (PE) W or WO Contrast  Result Date: 09/16/2021 CLINICAL DATA:  Central chest heaviness and shortness of breath. Currently on chemotherapy for multiple myeloma. EXAM: CT ANGIOGRAPHY CHEST WITH CONTRAST TECHNIQUE: Multidetector CT imaging of the chest was performed using the standard protocol during bolus administration of intravenous contrast. Multiplanar CT image reconstructions and MIPs were obtained to evaluate the vascular anatomy. CONTRAST:  60m OMNIPAQUE IOHEXOL 350 MG/ML SOLN COMPARISON:  Chest x-ray from same day. FINDINGS: Cardiovascular: Acute nonocclusive right upper and lower lobar and segmental pulmonary emboli. RV/LV ratio measures 0.9.  Borderline cardiomegaly. Trace pericardial effusion. No thoracic aortic aneurysm or dissection. Coronary, aortic arch, and branch vessel atherosclerotic vascular disease. Mediastinum/Nodes: No enlarged mediastinal, hilar, or axillary lymph nodes. Thyroid gland, trachea, and esophagus demonstrate no significant findings. Lungs/Pleura: Minimal subsegmental atelectasis at the lung bases. No focal consolidation, pleural effusion, or pneumothorax. Upper Abdomen: No acute abnormality. Postsurgical changes at the gastroesophageal junction with recurrent small hiatal hernia. Musculoskeletal: No chest wall abnormality. No acute or significant osseous findings. Review of the MIP images confirms the above findings. IMPRESSION: 1. Acute nonocclusive right upper and lower lobar and segmental pulmonary emboli. Positive for acute PE with CT evidence of right heart strain (RV/LV Ratio = 0.9.) consistent with at least submassive (intermediate risk) PE. The presence of right heart strain has been associated with an increased risk of morbidity and mortality. 2. Aortic Atherosclerosis (ICD10-I70.0). Critical Value/emergent results were called by telephone at the time of interpretation on 09/16/2021 at 8:27 am to provider [facility] who verbally acknowledged these results. Electronically Signed   By: WTitus DubinM.D.   On: 09/16/2021 08:34     Future Appointments  Date Time Provider [place]  09/16/2021  2:00 PM [facility] [facility]  12/12/2021 10:00 AM [facility] None  12/12/2021 10:20 AM Lilliahna Schubring, MD [facility] None      LOS: 0 days

## 2021-09-16 NOTE — Plan of Care (Signed)

## 2021-09-16 NOTE — ED Notes (Signed)
MD paged about potential downgrading to tele.

## 2021-09-16 NOTE — Progress Notes (Signed)
BLE venous duplex has been completed.  Preliminary findings given to Dr. Marylyn Ishihara & Andee Poles, RN.  Results can be found under chart review under CV PROC. 09/16/2021 12:58 PM Syriana Croslin RVT, RDMS

## 2021-09-16 NOTE — ED Provider Notes (Signed)
[facility]-EMERGENCY DEPT Provider Note   CSN: [id number] Arrival date & time: 09/16/21  0259     History Chief Complaint  Patient presents with   Chest Pain   Shortness of Breath    Jesse Mcclure. is a 70 y.o. adult.  Patient presents to the emergency department for evaluation of chest heaviness and shortness of breath.  Patient reports that he was awakened from sleep by the symptoms.  He went to bed feeling fine.  Patient reports that he is being treated for multiple myeloma, received a new infusion yesterday and is concerned that this might be a reaction.  No rash.  No tongue or throat swelling.      Past Medical History:  Diagnosis Date   BPH (benign prostatic hypertrophy)    Degenerative disc disease    Diverticulosis    Hemochromatosis    HTN (hypertension)    Hyperlipidemia    Multiple myeloma (HCC)    Polio    Status post Nissen fundoplication (without gastrostomy tube) procedure    for hiatal hernia.    Patient Active Problem List   Diagnosis Date Noted   S/P TKR (total knee replacement), left 07/15/2021   Osteoarthritis of left knee 05/22/2021   Deficiency anemia 01/07/2021   ACE-inhibitor cough 12/16/2020   Drug-induced neutropenia (HCC) 11/18/2020   Left knee pain 09/09/2020   Leukopenia due to antineoplastic chemotherapy (HCC) 07/22/2020   Tremor of both hands 10/11/2019   Peripheral neuropathy due to chemotherapy (HCC) 10/11/2019   Dry skin 10/11/2019   Insomnia disorder 09/06/2019   Loose stools 09/06/2019   S/P bone marrow transplant (HCC) 08/30/2019   Pancytopenia, acquired (HCC) 08/30/2019   Hypocalcemia 06/14/2019   Goals of care, counseling/discussion 04/12/2019   Multiple myeloma without remission (HCC) 04/04/2019   Other constipation 04/04/2019   Glucose intolerance (impaired glucose tolerance) 05/20/2016   Vitamin D deficiency 05/20/2016   Chronic musculoskeletal pain 01/09/2016   History of orchiectomy,  unilateral 05/20/2015   Obesity (BMI 30-39.9) 05/20/2015   Elevated PSA 05/20/2015   Arthritis 05/20/2015   Essential hypertension 03/02/2012   Hyperlipidemia 03/02/2012   Hemochromatosis, hereditary (HCC) 11/28/2008    Past Surgical History:  Procedure Laterality Date   FOOT SURGERY     HIATAL HERNIA REPAIR     OTHER SURGICAL HISTORY     Cataract Surgery--Both eyes   TOTAL KNEE ARTHROPLASTY Left 07/15/2021   Procedure: TOTAL KNEE ARTHROPLASTY;  Surgeon: Landau, Joshua, MD;  Location: [facility];  Service: Orthopedics;  Laterality: Left;   Undescended testes Right 1968     OB History   No obstetric history on file.     Family History  Problem Relation Age of Onset   Breast cancer Mother    Breast cancer Sister     Social History   Tobacco Use   Smoking status: Never   Smokeless tobacco: Never  Vaping Use   Vaping Use: Never used  Substance Use Topics   Alcohol use: Yes    Alcohol/week: 4.0 standard drinks    Types: 4 drink(s) per week    Comment: 3-4 drinks per week.    Drug use: No    Home Medications Prior to Admission medications   Medication Sig Start Date End Date Taking? Authorizing Provider  acetaminophen (TYLENOL) 650 MG CR tablet Take 650 mg by mouth every 8 (eight) hours as needed for pain.   Yes [provider]  acyclovir (ZOVIRAX) 400 MG tablet Take 1 tablet (400   mg total) by mouth 2 (two) times daily. Patient taking differently: Take 400 mg by mouth daily. 01/07/21  Yes Gorsuch, Ni, MD  calcium carbonate (TUMS - DOSED IN MG ELEMENTAL CALCIUM) 500 MG chewable tablet Chew 1-2 tablets by mouth daily as needed for heartburn.   Yes [provider]  Cholecalciferol (VITAMIN D) 50 MCG (2000 UT) tablet Take 2,000 Units by mouth daily.   Yes [provider]  folic acid (FOLVITE) 400 MCG tablet Take 400 mcg by mouth daily.   Yes [provider]  lenalidomide (REVLIMID) 15 MG capsule Take 1 capsule (15 mg total) by mouth daily.  Take 1 capsule for 21 days, then stop for 7 days for a cycle of every 28 days 08/26/21  Yes Gorsuch, Ni, MD  losartan-hydrochlorothiazide (HYZAAR) 100-12.5 MG tablet Take 1 tablet by mouth daily. 12/20/20  Yes Lalonde, John C, MD  Multiple Vitamin (MULTI-VITAMIN) tablet Take 1 tablet by mouth daily. 08/30/19  Yes [provider]  oxyCODONE (ROXICODONE) 5 MG immediate release tablet Take 1 tablet (5 mg total) by mouth every 4 (four) hours as needed for severe pain. 09/09/21  Yes Gorsuch, Ni, MD  Polyethyl Glycol-Propyl Glycol (SYSTANE OP) Place 1 drop into both eyes daily as needed (dry eyes).   Yes [provider]  vitamin B-12 (CYANOCOBALAMIN) 1000 MCG tablet Take 1,000 mcg by mouth daily.   Yes [provider]    Allergies    Patient has no known allergies.  Review of Systems   Review of Systems  Respiratory:  Positive for shortness of breath.   Cardiovascular:  Positive for chest pain.  All other systems reviewed and are negative.  Physical Exam Updated Vital Signs BP 125/90 (BP Location: Right Arm)   Pulse (!) 102   Temp 98.4 F (36.9 C) (Oral)   Resp (!) 24   Ht 5' 7.5" (1.715 m)   Wt 107 kg   SpO2 96%   BMI 36.42 kg/m   Physical Exam Vitals and nursing note reviewed.  Constitutional:      General: He is not in acute distress.    Appearance: Normal appearance. He is well-developed.  HENT:     Head: Normocephalic and atraumatic.     Right Ear: Hearing normal.     Left Ear: Hearing normal.     Nose: Nose normal.  Eyes:     Conjunctiva/sclera: Conjunctivae normal.     Pupils: Pupils are equal, round, and reactive to light.  Cardiovascular:     Rate and Rhythm: Regular rhythm. Tachycardia present.     Heart sounds: S1 normal and S2 normal. No murmur heard.   No friction rub. No gallop.  Pulmonary:     Effort: Pulmonary effort is normal. No respiratory distress.     Breath sounds: Normal breath sounds.  Chest:     Chest wall: No tenderness.   Abdominal:     General: Bowel sounds are normal.     Palpations: Abdomen is soft.     Tenderness: There is no abdominal tenderness. There is no guarding or rebound. Negative signs include Murphy's sign and McBurney's sign.     Hernia: No hernia is present.  Musculoskeletal:        General: Normal range of motion.     Cervical back: Normal range of motion and neck supple.  Skin:    General: Skin is warm and dry.     Findings: No rash.  Neurological:     Mental Status: He is   alert and oriented to person, place, and time.     GCS: GCS eye subscore is 4. GCS verbal subscore is 5. GCS motor subscore is 6.     Cranial Nerves: No cranial nerve deficit.     Sensory: No sensory deficit.     Coordination: Coordination normal.  Psychiatric:        Speech: Speech normal.        Behavior: Behavior normal.        Thought Content: Thought content normal.    ED Results / Procedures / Treatments   Labs (all labs ordered are listed, but only abnormal results are displayed) Labs Reviewed  BASIC METABOLIC PANEL - Abnormal; Notable for the following components:      Result Value   Glucose, Bld 150 (*)    BUN 26 (*)    Calcium 8.6 (*)    All other components within normal limits  CBC - Abnormal; Notable for the following components:   RBC 3.73 (*)    MCV 110.2 (*)    MCH 37.3 (*)    Platelets 118 (*)    All other components within normal limits  TROPONIN I (HIGH SENSITIVITY) - Abnormal; Notable for the following components:   Troponin I (High Sensitivity) 33 (*)    All other components within normal limits  TROPONIN I (HIGH SENSITIVITY) - Abnormal; Notable for the following components:   Troponin I (High Sensitivity) 43 (*)    All other components within normal limits    EKG EKG Interpretation  Date/Time:  Tuesday September 16 2021 03:04:58 EST Ventricular Rate:  119 PR Interval:    QRS Duration: 80 QT Interval:  316 QTC Calculation: 445 R Axis:   29 Text  Interpretation: Junctional tachycardia Nonspecific ST abnormality Confirmed by Pollina, Christopher J ([id number]) on 09/16/2021 5:44:52 AM  Radiology DG Chest 2 View  Result Date: 09/16/2021 CLINICAL DATA:  Chest pain EXAM: CHEST - 2 VIEW COMPARISON:  None. FINDINGS: Lungs are clear.  No pleural effusion or pneumothorax. The heart is normal in size. Degenerative changes of the visualized thoracolumbar spine. IMPRESSION: Normal chest radiographs. Electronically Signed   By: Sriyesh  Krishnan M.D.   On: 09/16/2021 03:19    Procedures Procedures   Medications Ordered in ED Medications  sodium chloride (PF) 0.9 % injection (has no administration in time range)  iohexol (OMNIPAQUE) 350 MG/ML injection 80 mL (has no administration in time range)  sodium chloride 0.9 % bolus 500 mL (500 mLs Intravenous New Bag/Given 09/16/21 0621)    ED Course  I have reviewed the triage vital signs and the nursing notes.  Pertinent labs & imaging results that were available during my care of the patient were reviewed by me and considered in my medical decision making (see chart for details).    MDM Rules/Calculators/A&P                           Patient presents to the emergency department for evaluation of chest discomfort and shortness of breath.  Patient reports that he was awakened from sleep with these problems.  Patient's EKG does not show obvious ischemia, nonspecific changes.  First troponin is mildly elevated.  With his tachycardia abdomen predominant shortness of breath, PE considered.  Will perform CT angio PE study.  Will sign to oncoming ER physician.  Patient will require hospitalization.  Final Clinical Impression(s) / ED Diagnoses Final diagnoses:  Chest pain, unspecified type    Rx /   DC Orders ED Discharge Orders     None        Tykisha Areola, Gwenyth Allegra, MD 09/16/21 [phone]

## 2021-09-16 NOTE — H&P (Addendum)
History and Physical    [name]. TIW:[id number] DOB: 01-Jul-1951 DOA: 09/16/2021  PCP: Denita Lung, MD  Patient coming from: Home  Chief Complaint: chest pain, dyspnea  HPI: Eean Buss. is a 70 y.o. adult with medical history significant of MM on chemo, HTN , osteoarthritis. Presenting with chest pain. Symptoms started around [place]. He has sharp pain in his left chest and it was difficult to breath. He tried his oxycodone, but it didn't help. He has never had pain like this before. He became concerned and came to the ED for help.   Of note, he had a recent TKR back in October. Otherwise, he denies any other aggravating or alleviating factors.   ED Course: CTA showed a submassive PE w/ right heart strain. He was started on heparin gtt. TRH was called for admission.   Review of Systems:  Denies palpitations, abdominal pain, lightheadedness, dizziness, N/V/D, fever, syncopal episodes. Review of systems is otherwise negative for all not mentioned in HPI.   PMHx Past Medical History:  Diagnosis Date   BPH (benign prostatic hypertrophy)    Degenerative disc disease    Diverticulosis    Hemochromatosis    HTN (hypertension)    Hyperlipidemia    Multiple myeloma (HCC)    Polio    Status post Nissen fundoplication (without gastrostomy tube) procedure    for hiatal hernia.    PSHx Past Surgical History:  Procedure Laterality Date   FOOT SURGERY     HIATAL HERNIA REPAIR     OTHER SURGICAL HISTORY     Cataract Surgery--Both eyes   TOTAL KNEE ARTHROPLASTY Left 07/15/2021   Procedure: TOTAL KNEE ARTHROPLASTY;  Surgeon: Marchia Bond, MD;  Location: [facility];  Service: Orthopedics;  Laterality: Left;   Undescended testes Right 1968    SocHx  reports that he has never smoked. He has never used smokeless tobacco. He reports current alcohol use of about 4.0 standard drinks per week. He reports that he does not use drugs.  No Known Allergies  FamHx Family History   Problem Relation Age of Onset   Breast cancer Mother    Breast cancer Sister     Prior to Admission medications   Medication Sig Start Date End Date Taking? Authorizing Provider  acetaminophen (TYLENOL) 650 MG CR tablet Take 650 mg by mouth every 8 (eight) hours as needed for pain.   Yes [provider]  acyclovir (ZOVIRAX) 400 MG tablet Take 1 tablet (400 mg total) by mouth 2 (two) times daily. Patient taking differently: Take 400 mg by mouth daily. 01/07/21  Yes Gorsuch, Ni, MD  calcium carbonate (TUMS - DOSED IN MG ELEMENTAL CALCIUM) 500 MG chewable tablet Chew 1-2 tablets by mouth daily as needed for heartburn.   Yes [provider]  Cholecalciferol (VITAMIN D) 50 MCG (2000 UT) tablet Take 2,000 Units by mouth daily.   Yes [provider]  folic acid (FOLVITE) 250 MCG tablet Take 400 mcg by mouth daily.   Yes [provider]  lenalidomide (REVLIMID) 15 MG capsule Take 1 capsule (15 mg total) by mouth daily. Take 1 capsule for 21 days, then stop for 7 days for a cycle of every 28 days 08/26/21  Yes Gorsuch, Ni, MD  losartan-hydrochlorothiazide (HYZAAR) 100-12.5 MG tablet Take 1 tablet by mouth daily. 12/20/20  Yes Denita Lung, MD  Multiple Vitamin (MULTI-VITAMIN) tablet Take 1 tablet by mouth daily. 08/30/19  Yes [provider]  oxyCODONE (ROXICODONE) 5  MG immediate release tablet Take 1 tablet (5 mg total) by mouth every 4 (four) hours as needed for severe pain. 09/09/21  Yes Gorsuch, Ernst Spell, MD  Polyethyl Glycol-Propyl Glycol (SYSTANE OP) Place 1 drop into both eyes daily as needed (dry eyes).   Yes [provider]  vitamin B-12 (CYANOCOBALAMIN) 1000 MCG tablet Take 1,000 mcg by mouth daily.   Yes [provider]    Physical Exam: Vitals:   09/16/21 0722 09/16/21 0730 09/16/21 0800 09/16/21 0830  BP: 125/90 128/78 136/79 120/75  Pulse: (!) 102 (!) 101 (!) 104 99  Resp: (!) 24 (!) _0 Temp:      TempSrc:      SpO2:  96% 96% 96% 96%  Weight:      Height:        General: 70 y.o. adult resting in bed in NAD Eyes: PERRL, normal sclera ENMT: Nares patent w/o discharge, orophaynx clear, dentition normal, ears w/o discharge/lesions/ulcers Neck: Supple, trachea midline Cardiovascular: RRR, +S1, S2, no m/g/r, equal pulses throughout Respiratory: CTABL, no w/r/r, normal WOB GI: BS+, NDNT, obese no masses noted, no organomegaly noted MSK: No c/c; LLE edema Skin: No rashes, bruises, ulcerations noted Neuro: A&O x 3, no focal deficits Psyc: Appropriate interaction and affect, calm/cooperative  Labs on Admission: I have personally reviewed following labs and imaging studies  CBC: Recent Labs  Lab 09/15/21 1007 09/16/21 0306  WBC 3.7* 4.6  NEUTROABS 1.8  --   HGB 13.0 13.9  HCT 37.8* 41.1  MCV 107.4* 110.2*  PLT 128* 761*   Basic Metabolic Panel: Recent Labs  Lab 09/15/21 1007 09/16/21 0306  NA 139 136  K 3.6 3.5  CL 105 102  CO2 25 24  GLUCOSE 117* 150*  BUN 16 26*  CREATININE 0.78 0.92  CALCIUM 8.4* 8.6*   GFR: Estimated Creatinine Clearance (by C-G formula based on SCr of 0.92 mg/dL) Male: 72.3 mL/min Male: 87.9 mL/min Liver Function Tests: Recent Labs  Lab 09/15/21 1007  AST 20  ALT 27  ALKPHOS 65  BILITOT 1.1  PROT 6.4*  ALBUMIN 3.5   No results for input(s): LIPASE, AMYLASE in the last 168 hours. No results for input(s): AMMONIA in the last 168 hours. Coagulation Profile: No results for input(s): INR, PROTIME in the last 168 hours. Cardiac Enzymes: No results for input(s): CKTOTAL, CKMB, CKMBINDEX, TROPONINI in the last 168 hours. BNP (last 3 results) No results for input(s): PROBNP in the last 8760 hours. HbA1C: No results for input(s): HGBA1C in the last 72 hours. CBG: No results for input(s): GLUCAP in the last 168 hours. Lipid Profile: No results for input(s): CHOL, HDL, LDLCALC, TRIG, CHOLHDL, LDLDIRECT in the last 72 hours. Thyroid Function Tests: No  results for input(s): TSH, T4TOTAL, FREET4, T3FREE, THYROIDAB in the last 72 hours. Anemia Panel: No results for input(s): VITAMINB12, FOLATE, FERRITIN, TIBC, IRON, RETICCTPCT in the last 72 hours. Urine analysis:    Component Value Date/Time   BILIRUBINUR n 05/20/2016 1101   PROTEINUR n 05/20/2016 1101   UROBILINOGEN negative 05/20/2016 1101   NITRITE n 05/20/2016 1101   LEUKOCYTESUR Negative 05/20/2016 1101    Radiological Exams on Admission: DG Chest 2 View  Result Date: 09/16/2021 CLINICAL DATA:  Chest pain EXAM: CHEST - 2 VIEW COMPARISON:  None. FINDINGS: Lungs are clear.  No pleural effusion or pneumothorax. The heart is normal in size. Degenerative changes of the visualized thoracolumbar spine. IMPRESSION: Normal chest radiographs. Electronically Signed   By: Bertis Ruddy  [place] Pink M.D.   On: 09/16/2021 03:19   CT Angio Chest Pulmonary Embolism (PE) W or WO Contrast  Result Date: 09/16/2021 CLINICAL DATA:  Central chest heaviness and shortness of breath. Currently on chemotherapy for multiple myeloma. EXAM: CT ANGIOGRAPHY CHEST WITH CONTRAST TECHNIQUE: Multidetector CT imaging of the chest was performed using the standard protocol during bolus administration of intravenous contrast. Multiplanar CT image reconstructions and MIPs were obtained to evaluate the vascular anatomy. CONTRAST:  7m OMNIPAQUE IOHEXOL 350 MG/ML SOLN COMPARISON:  Chest x-ray from same day. FINDINGS: Cardiovascular: Acute nonocclusive right upper and lower lobar and segmental pulmonary emboli. RV/LV ratio measures 0.9. Borderline cardiomegaly. Trace pericardial effusion. No thoracic aortic aneurysm or dissection. Coronary, aortic arch, and branch vessel atherosclerotic vascular disease. Mediastinum/Nodes: No enlarged mediastinal, hilar, or axillary lymph nodes. Thyroid gland, trachea, and esophagus demonstrate no significant findings. Lungs/Pleura: Minimal subsegmental atelectasis at the lung bases. No focal  consolidation, pleural effusion, or pneumothorax. Upper Abdomen: No acute abnormality. Postsurgical changes at the gastroesophageal junction with recurrent small hiatal hernia. Musculoskeletal: No chest wall abnormality. No acute or significant osseous findings. Review of the MIP images confirms the above findings. IMPRESSION: 1. Acute nonocclusive right upper and lower lobar and segmental pulmonary emboli. Positive for acute PE with CT evidence of right heart strain (RV/LV Ratio = 0.9.) consistent with at least submassive (intermediate risk) PE. The presence of right heart strain has been associated with an increased risk of morbidity and mortality. 2. Aortic Atherosclerosis (ICD10-I70.0). Critical Value/emergent results were called by telephone at the time of interpretation on 09/16/2021 at 8:27 am to provider [facility] who verbally acknowledged these results. Electronically Signed   By: WTitus DubinM.D.   On: 09/16/2021 08:34    EKG: Independently reviewed. Regular rhythm with accelerated rate (possibly junctional), with no st elevations  Assessment/Plan Acute submassive PE Left lower extremity DVT     - admit to inpt, SDU     - continue heparin gtt     - check echo and BLE venous doppler (he does have asymmetric swelling of LLE); UPDATE: LLE DVT noted on doppler     - inciting event? Recent TKR, he is also on chemo for MM -- holding his chemo     - discussed his anticoagulation options; he has yet to make a decision  Chest pain     - likely secondary to above     - trp: 33 -> 43     - EKG appears like a jxnal tachy w/o st elevations     - ehco pending  Multiple Myeloma     - hold his chemo     - onco notified, will follow     - he is on acyclovir for PPx  HTN     - BP looks good; can resume home regimen  Hyperglycemia     - check A1c  DVT prophylaxis: Heparin gtt  Code Status: DNR  Family Communication: None at bedside  Consults called: Onco (Dr. GAlvy Bimler   Status is:  Inpatient  Remains inpatient appropriate because: severity of illness  Delbra Zellars A Gwendelyn Lanting DO [facility]  If 7PM-7AM, please contact night-coverage [email or web address]  09/16/2021, 8:49 AM

## 2021-09-16 NOTE — ED Triage Notes (Signed)
Pt reports with chest pain (heaviness center chest) and shortness of breath since waking up. Pt reports taking a new chemo medication yesterday. Pt states that he took 325 mg Aspirin.

## 2021-09-16 NOTE — Progress Notes (Signed)
Pharmacy: [facility]  Patient is a 70 y.o M with multiple myeloma on lenalidomide PTA  and s/p L TKA on 07/15/21, presented to the ED on 09/16/21 with c/o chest pain and SOB.  Chest CTA showed "Acute nonocclusive right upper and lower lobar and segmental pulmonary emboli. Positive for acute PE with CT evidence of right heart strain (RV/LV Ratio = 0.9.) consistent with at least submassive."  LE doppler came back positive for "acute deep vein thrombosis involving the left  peroneal veins." He's currently on heparin drip for VTE treatment.   - first heparin level collected at 1810  was elevated at >1.10, but it was drawn incorrectly. Level was collected from IV line site. Repeat heparin level collected appropriately at [place] is therapeutic at 0.36. - cbc stable   Goal of Therapy: Heparin level 0.3-0.7 units/ml Monitor platelets by anticoagulation protocol: Yes   Plan: - continue heparin drip at 1550 units/hr - check another level at 0400 on 12/14 to ensure level is still therapeutic before changing to daily monitoring. - monitor for s/sx bleeding  Dia Sitter, PharmD, BCPS 09/16/2021 8:08 PM

## 2021-09-17 ENCOUNTER — Telehealth: Payer: Self-pay

## 2021-09-17 ENCOUNTER — Inpatient Hospital Stay (HOSPITAL_COMMUNITY): Payer: Medicare Other

## 2021-09-17 DIAGNOSIS — I2602 Saddle embolus of pulmonary artery with acute cor pulmonale: Secondary | ICD-10-CM

## 2021-09-17 DIAGNOSIS — I82402 Acute embolism and thrombosis of unspecified deep veins of left lower extremity: Secondary | ICD-10-CM

## 2021-09-17 LAB — COMPREHENSIVE METABOLIC PANEL
ALT: 32 U/L (ref 0–44)
AST: 36 U/L (ref 15–41)
Albumin: 3.3 g/dL — ABNORMAL LOW (ref 3.5–5.0)
Alkaline Phosphatase: 46 U/L (ref 38–126)
Anion gap: 7 (ref 5–15)
BUN: 21 mg/dL (ref 8–23)
CO2: 26 mmol/L (ref 22–32)
Calcium: 7.2 mg/dL — ABNORMAL LOW (ref 8.9–10.3)
Chloride: 100 mmol/L (ref 98–111)
Creatinine, Ser: 0.87 mg/dL (ref 0.61–1.24)
GFR, Estimated: >60 mL/min (ref >60)
Glucose, Bld: 119 mg/dL — ABNORMAL HIGH (ref 70–99)
Potassium: 2.9 mmol/L — ABNORMAL LOW (ref 3.5–5.1)
Sodium: 133 mmol/L — ABNORMAL LOW (ref 135–145)
Total Bilirubin: 1.6 mg/dL — ABNORMAL HIGH (ref 0.3–1.2)
Total Protein: 6.1 g/dL — ABNORMAL LOW (ref 6.5–8.1)

## 2021-09-17 LAB — MAGNESIUM: Magnesium: 1.8 mg/dL (ref 1.7–2.4)

## 2021-09-17 LAB — CBC
HCT: 33.2 % — ABNORMAL LOW (ref 39.0–52.0)
Hemoglobin: 11.5 g/dL — ABNORMAL LOW (ref 13.0–17.0)
MCH: 37.5 pg — ABNORMAL HIGH (ref 26.0–34.0)
MCHC: 34.6 g/dL (ref 30.0–36.0)
MCV: 108.1 fL — ABNORMAL HIGH (ref 80.0–100.0)
Platelets: 93 10*3/uL — ABNORMAL LOW (ref 150–400)
RBC: 3.07 MIL/uL — ABNORMAL LOW (ref 4.22–5.81)
RDW: 14 % (ref 11.5–15.5)
WBC: 2.6 10*3/uL — ABNORMAL LOW (ref 4.0–10.5)
nRBC: 0 % (ref 0.0–0.2)

## 2021-09-17 LAB — ECHOCARDIOGRAM COMPLETE
Area-P 1/2: 3.3 cm2
Height: 67 in
S' Lateral: 2.5 cm
Weight: 3781.33 oz

## 2021-09-17 LAB — PHOSPHORUS: Phosphorus: 2.9 mg/dL (ref 2.5–4.6)

## 2021-09-17 LAB — HEPARIN LEVEL (UNFRACTIONATED)
Heparin Unfractionated: 0.23 IU/mL — ABNORMAL LOW (ref 0.30–0.70)
Heparin Unfractionated: 0.53 IU/mL (ref 0.30–0.70)

## 2021-09-17 LAB — HIV ANTIBODY (ROUTINE TESTING W REFLEX): HIV Screen 4th Generation wRfx: NONREACTIVE

## 2021-09-17 LAB — HEMOGLOBIN A1C
Hgb A1c MFr Bld: 5 % (ref 4.8–5.6)
Mean Plasma Glucose: 97 mg/dL

## 2021-09-17 MED ORDER — POTASSIUM CHLORIDE CRYS ER 20 MEQ PO TBCR
40.0000 meq | EXTENDED_RELEASE_TABLET | Freq: Two times a day (BID) | ORAL | Status: AC
  Administered 2021-09-17 (×2): 40 meq via ORAL
  Filled 2021-09-17 (×2): qty 2

## 2021-09-17 MED ORDER — HEPARIN BOLUS VIA INFUSION
2000.0000 [IU] | Freq: Once | INTRAVENOUS | Status: AC
  Administered 2021-09-17: 06:00:00 2000 [IU] via INTRAVENOUS
  Filled 2021-09-17: qty 2000

## 2021-09-17 MED ORDER — MAGNESIUM SULFATE 2 GM/50ML IV SOLN
2.0000 g | Freq: Once | INTRAVENOUS | Status: AC
  Administered 2021-09-17: 12:00:00 2 g via INTRAVENOUS
  Filled 2021-09-17: qty 50

## 2021-09-17 NOTE — Progress Notes (Signed)
PROGRESS NOTE    Jesse Mcclure.  XTG:[id number] DOB: 1951-06-06 DOA: 09/16/2021 PCP: Denita Lung, MD   Brief Narrative:  Is a 70 year old Caucasian obese male with a past medical history significant for but not limited to multiple myeloma on chemotherapy, hypertension, osteoarthritis and recent knee surgery as well as other comorbidities who presented to the ED with chest pain and dyspnea.  Symptoms started around midnight and he states that he had sharp pain left side of his chest and it was difficult to breathe.  He tried oxycodone but it did not help and he never had pain like this before and so because of this he became concerned and came to the ED for help.  Further work-up was done and CT of the chest done and showed submassive PE with right heart strain and he was started on heparin drip and further worked up.  Pulmonary and oncology were consulted and his Revlimid has been discontinued.  Because he was hemodynamically stable pulmonary recommended no indication for systemic lysis or catheter directed lysis.  Plan is to continue heparin drip for 1 more day and transition to Xarelto in the morning given patient preference  Assessment & Plan:   Principal Problem:   Acute pulmonary embolism ([place])  Acute submassive PE and right upper and lower segmental pulmonary with Lower Extremity DVT -In the setting of his recent knee surgery back in October as well as Revlimid -We will extremity venous duplex did show acute DVT in the peroneal veins -Echocardiogram has been done and pending read -We will continue heparin drip for now and switch him to Xarelto after patient discussion -We will transfer out of the ICU to the medical floor -Troponin was mildly elevated and trended up from 33 -> 43 -> 46 -Patient would like to go on Xarelto dosing -Patient will need an ambulatory home O2 screen prior to discharge -Patient did have some chest discomfort yesterday but this is improved; he is on pain  control with oxycodone 5 mg every 4 hours as needed severe pain as well as IV morphine 2 mg every 2 as needed severe pain -Pulmonary is consulted and recommending no indication for systemic lysis or catheter directed lysis -Per pulmonary if patient has further decline to include hemodynamic instability or increasing oxygen requirements today we will reconsider any additional intervention that may need at that time  Hypertension -Continuing current home medications including losartan/hydrochlorothiazide 100-12.5 mg p.o. daily -Continue to monitor blood pressures per protocol Last blood pressure reading was 114/55  Pancytopenia History of bone marrow transplant -Had a mild pancytopenia in the setting of his recent surgery and chemotherapy -Patient's CBC now showing a WBC of 2.6, hemoglobin/hematocrit 11.5/3.2 and a platelet count of 93 -Currently not symptomatic -Transfuse for hemoglobin less than 7 and continue to monitor for signs and symptoms -We will continue observe closely and repeat CBC in the morning  MM with out remission -Medical oncology has been consulted and recommending discontinuing Revlimid due to his new acute PE and recommending continued supportive care -We will continue acyclovir 400 mg p.o. daily for prophylaxis -Myeloma panel still pending but per the oncologist based on his last set of myeloma panels he did have a partial response -Medical oncology recommending seeing the patient back within a month in outpatient clinic to discuss neck steps  Hypokalemia -Mild at 2.9 and dropped from 3.5; patient is mag level was 1.8 -Replete both mag level and potassium; potassium with treatment with p.o. KCl 40 mEq  twice daily x2 doses and Mag with IV Mag Sulfate 2 grams -Continue to monitor and replete as necessary -Repeat CMP in a.m.  Hyperbilirubinemia -Mild.  Patient's T bili is now 1.6 -Continue to monitor and trend and repeat CMP in a.m.  Hyperglycemia -Likely reactive  as patient's hemoglobin A1c was 5.0 -Continue monitor blood sugars carefully and if necessary will place on sensitive NovoLog sliding scale insulin AC  Hyponatremia -Mild and patient's sodium dropped from 136 and is now 133 -Continue to monitor and trend and repeat CMP in the a.m.  Obesity -Complicates overall prognosis and care -Estimated body mass index is 37.02 kg/m as calculated from the following:   Height as of this encounter: _0  (1.702 m).   Weight as of this encounter: 107.2 kg. -Weight Loss and Dietary Counseling given   DVT prophylaxis: Anticoagulated with heparin drip Code Status: DO NOT RESUSCITATE Family Communication: No family currently at bedside Disposition Plan: Transfer out of the ICU to the medical floor and anticipate discharging home in next 24 to 48 hours on oral anticoagulation  Status is: Inpatient  Remains inpatient appropriate because: She remains on a heparin drip and will need ambulatory home O2 screen prior to discharge   Consultants:  Pulmonary Medical oncology  Procedures:  LE VENOUS DUPLEX Summary:  BILATERAL:  - No evidence of superficial venous thrombosis in the lower extremities,  bilaterally.  -No evidence of popliteal cyst, bilaterally.  RIGHT:  - There is no evidence of deep vein thrombosis in the lower extremity.     LEFT:  - Findings consistent with acute deep vein thrombosis involving the left  peroneal veins.      *See table(s) above for measurements and observations.   ECHOCARDIOGRAM Done and pending read  Antimicrobials:  Anti-infectives (From admission, onward)    Start     Dose/Rate Route Frequency Ordered Stop   09/16/21 2200  acyclovir (ZOVIRAX) tablet 400 mg        400 mg Oral Daily 09/16/21 1018          Subjective: Patient was seen and examined at bedside and he is doing fairly well and denied any chest discomfort today.  No nausea or vomiting.  Thinks he is doing a little bit better overall known  lightheadedness or dizziness.  No other concerns or comments at this time.  Objective: Vitals:   09/17/21 0500 09/17/21 0600 09/17/21 0700 09/17/21 0800  BP: 123/68 105/60 111/70 118/73  Pulse: 73 65 65 73  Resp: _1 Temp:    98.4 F (36.9 C)  TempSrc:    Oral  SpO2:   95% 98%  Weight:      Height:        Intake/Output Summary (Last 24 hours) at 09/17/2021 0300 Last data filed at 09/17/2021 9233 Gross per 24 hour  Intake 395.58 ml  Output 600 ml  Net -204.42 ml   Filed Weights   09/16/21 0306 09/16/21 1801  Weight: 107 kg 107.2 kg    Examination: Physical Exam:  Constitutional: WN/WD obese Caucasian male currently in NAD and appears calm and comfortable Eyes: Lids and conjunctivae normal, sclerae anicteric  ENMT: External Ears, Nose appear normal. Grossly normal hearing. Mucous membranes are moist.  Neck: Appears normal, supple, no cervical masses, normal ROM, no appreciable thyromegaly; no appreciable JVD Respiratory: Slightly diminished to auscultation bilaterally, no wheezing, rales, rhonchi or crackles. Normal respiratory effort and patient is not tachypenic. No accessory muscle use.  Cardiovascular: RRR, no  murmurs / rubs / gallops. S1 and S2 auscultated.  Is 1+ lower extremity edema slightly worse on the left compared to right Abdomen: Soft, non-tender, distended secondary body habitus. Bowel sounds positive.  GU: Deferred. Musculoskeletal: No clubbing / cyanosis of digits/nails. No joint deformity upper and lower extremities.  Skin: No rashes, lesions, ulcers on limited skin evaluation. No induration; Warm and dry.  Neurologic: CN 2-12 grossly intact with no focal deficits. Romberg sign and cerebellar reflexes not assessed.  Psychiatric: Normal judgment and insight. Alert and oriented x 3. Normal mood and appropriate affect.   Data Reviewed: I have personally reviewed following labs and imaging studies  CBC: Recent Labs  Lab 09/15/21 1007  09/16/21 0306 09/17/21 0303  WBC 3.7* 4.6 2.6*  NEUTROABS 1.8  --   --   HGB 13.0 13.9 11.5*  HCT 37.8* 41.1 33.2*  MCV 107.4* 110.2* 108.1*  PLT 128* 118* 93*   Basic Metabolic Panel: Recent Labs  Lab 09/15/21 1007 09/16/21 0306 09/17/21 0303  NA 139 136 133*  K 3.6 3.5 2.9*  CL 105 102 100  CO2 _0 GLUCOSE 117* 150* 119*  BUN 16 26* 21  CREATININE 0.78 0.92 0.87  CALCIUM 8.4* 8.6* 7.2*   GFR: Estimated Creatinine Clearance (by C-G formula based on SCr of 0.87 mg/dL) Male: 75.8 mL/min Male: 92.2 mL/min Liver Function Tests: Recent Labs  Lab 09/15/21 1007 09/17/21 0303  AST 20 36  ALT 27 32  ALKPHOS 65 46  BILITOT 1.1 1.6*  PROT 6.4* 6.1*  ALBUMIN 3.5 3.3*   No results for input(s): LIPASE, AMYLASE in the last 168 hours. No results for input(s): AMMONIA in the last 168 hours. Coagulation Profile: No results for input(s): INR, PROTIME in the last 168 hours. Cardiac Enzymes: No results for input(s): CKTOTAL, CKMB, CKMBINDEX, TROPONINI in the last 168 hours. BNP (last 3 results) No results for input(s): PROBNP in the last 8760 hours. HbA1C: Recent Labs    09/16/21 0306  HGBA1C 5.0   CBG: No results for input(s): GLUCAP in the last 168 hours. Lipid Profile: No results for input(s): CHOL, HDL, LDLCALC, TRIG, CHOLHDL, LDLDIRECT in the last 72 hours. Thyroid Function Tests: No results for input(s): TSH, T4TOTAL, FREET4, T3FREE, THYROIDAB in the last 72 hours. Anemia Panel: No results for input(s): VITAMINB12, FOLATE, FERRITIN, TIBC, IRON, RETICCTPCT in the last 72 hours. Sepsis Labs: No results for input(s): PROCALCITON, LATICACIDVEN in the last 168 hours.  Recent Results (from the past 240 hour(s))  Resp Panel by RT-PCR (Flu A&B, Covid) Nasopharyngeal Swab     Status: None   Collection Time: 09/16/21  9:25 AM   Specimen: Nasopharyngeal Swab; Nasopharyngeal(NP) swabs in vial transport medium  Result Value Ref Range Status   SARS Coronavirus 2 by  RT PCR NEGATIVE NEGATIVE Final    Comment: (NOTE) SARS-CoV-2 target nucleic acids are NOT DETECTED.  The SARS-CoV-2 RNA is generally detectable in upper respiratory specimens during the acute phase of infection. The lowest concentration of SARS-CoV-2 viral copies this assay can detect is 138 copies/mL. A negative result does not preclude SARS-Cov-2 infection and should not be used as the sole basis for treatment or other patient management decisions. A negative result may occur with  improper specimen collection/handling, submission of specimen other than nasopharyngeal swab, presence of viral mutation(s) within the areas targeted by this assay, and inadequate number of viral copies(<138 copies/mL). A negative result must be combined with clinical observations, patient history, and epidemiological information. The  expected result is Negative.  Fact Sheet for Patients:  EntrepreneurPulse.com.au  Fact Sheet for Healthcare Providers:  IncredibleEmployment.be  This test is no t yet approved or cleared by the [place] FDA and  has been authorized for detection and/or diagnosis of SARS-CoV-2 by FDA under an Emergency Use Authorization (EUA). This EUA will remain  in effect (meaning this test can be used) for the duration of the COVID-19 declaration under Section 564(b)(1) of the Act, 21 U.S.C.section 360bbb-3(b)(1), unless the authorization is terminated  or revoked sooner.       Influenza A by PCR NEGATIVE NEGATIVE Final   Influenza B by PCR NEGATIVE NEGATIVE Final    Comment: (NOTE) The Xpert Xpress SARS-CoV-2/FLU/RSV plus assay is intended as an aid in the diagnosis of influenza from Nasopharyngeal swab specimens and should not be used as a sole basis for treatment. Nasal washings and aspirates are unacceptable for Xpert Xpress SARS-CoV-2/FLU/RSV testing.  Fact Sheet for Patients: EntrepreneurPulse.com.au  Fact Sheet  for Healthcare Providers: IncredibleEmployment.be  This test is not yet approved or cleared by the [place] FDA and has been authorized for detection and/or diagnosis of SARS-CoV-2 by FDA under an Emergency Use Authorization (EUA). This EUA will remain in effect (meaning this test can be used) for the duration of the COVID-19 declaration under Section 564(b)(1) of the Act, 21 U.S.C. section 360bbb-3(b)(1), unless the authorization is terminated or revoked.  Performed at [facility], [place] [address], [place], [place] [address]   MRSA Next Gen by PCR, Nasal     Status: None   Collection Time: 09/16/21  7:01 PM   Specimen: Nasal Mucosa; Nasal Swab  Result Value Ref Range Status   MRSA by PCR Next Gen NOT DETECTED NOT DETECTED Final    Comment: (NOTE) The GeneXpert MRSA Assay (FDA approved for NASAL specimens only), is one component of a comprehensive MRSA colonization surveillance program. It is not intended to diagnose MRSA infection nor to guide or monitor treatment for MRSA infections. Test performance is not FDA approved in patients less than 7 years old. Performed at [facility], [place] [address], [place], [place] [address]     RN Pressure Injury Documentation:     Estimated body mass index is 37.02 kg/m as calculated from the following:   Height as of this encounter: _0  (1.702 m).   Weight as of this encounter: 107.2 kg.  Malnutrition Type:   Malnutrition Characteristics:   Nutrition Interventions:    Radiology Studies: DG Chest 2 View  Result Date: 09/16/2021 CLINICAL DATA:  Chest pain EXAM: CHEST - 2 VIEW COMPARISON:  None. FINDINGS: Lungs are clear.  No pleural effusion or pneumothorax. The heart is normal in size. Degenerative changes of the visualized thoracolumbar spine. IMPRESSION: Normal chest radiographs. Electronically Signed   By: Julian Hy M.D.   On: 09/16/2021 03:19   CT Angio  Chest Pulmonary Embolism (PE) W or WO Contrast  Result Date: 09/16/2021 CLINICAL DATA:  Central chest heaviness and shortness of breath. Currently on chemotherapy for multiple myeloma. EXAM: CT ANGIOGRAPHY CHEST WITH CONTRAST TECHNIQUE: Multidetector CT imaging of the chest was performed using the standard protocol during bolus administration of intravenous contrast. Multiplanar CT image reconstructions and MIPs were obtained to evaluate the vascular anatomy. CONTRAST:  78m OMNIPAQUE IOHEXOL 350 MG/ML SOLN COMPARISON:  Chest x-ray from same day. FINDINGS: Cardiovascular: Acute nonocclusive right upper and lower lobar and segmental pulmonary emboli. RV/LV ratio measures 0.9. Borderline cardiomegaly. Trace pericardial  effusion. No thoracic aortic aneurysm or dissection. Coronary, aortic arch, and branch vessel atherosclerotic vascular disease. Mediastinum/Nodes: No enlarged mediastinal, hilar, or axillary lymph nodes. Thyroid gland, trachea, and esophagus demonstrate no significant findings. Lungs/Pleura: Minimal subsegmental atelectasis at the lung bases. No focal consolidation, pleural effusion, or pneumothorax. Upper Abdomen: No acute abnormality. Postsurgical changes at the gastroesophageal junction with recurrent small hiatal hernia. Musculoskeletal: No chest wall abnormality. No acute or significant osseous findings. Review of the MIP images confirms the above findings. IMPRESSION: 1. Acute nonocclusive right upper and lower lobar and segmental pulmonary emboli. Positive for acute PE with CT evidence of right heart strain (RV/LV Ratio = 0.9.) consistent with at least submassive (intermediate risk) PE. The presence of right heart strain has been associated with an increased risk of morbidity and mortality. 2. Aortic Atherosclerosis (ICD10-I70.0). Critical Value/emergent results were called by telephone at the time of interpretation on 09/16/2021 at 8:27 am to provider MADISON [facility], who verbally acknowledged  these results. Electronically Signed   By: Titus Dubin M.D.   On: 09/16/2021 08:34   VAS [place] LOWER EXTREMITY VENOUS (DVT)  Result Date: 09/16/2021  Lower Venous DVT Study Patient Name:  Jaquari Reckner.  Date of Exam:   09/16/2021 Medical Rec #: [id number]            Accession #:    [id number] Date of Birth: Oct 13, 1950            Patient Gender: M Patient Age:   76 years Exam Location:  [facility] Procedure:      VAS [place] LOWER EXTREMITY VENOUS (DVT) Referring Phys: Cherylann Ratel --------------------------------------------------------------------------------  Indications: Pulmonary embolism.  Risk Factors: Surgery Left TKR - 07/15/2021. Comparison Study: No previous exams Performing Technologist: Jody Hill RVT, RDMS  Examination Guidelines: A complete evaluation includes B-mode imaging, spectral Doppler, color Doppler, and power Doppler as needed of all accessible portions of each vessel. Bilateral testing is considered an integral part of a complete examination. Limited examinations for reoccurring indications may be performed as noted. The reflux portion of the exam is performed with the patient in reverse Trendelenburg.  +---------+---------------+---------+-----------+----------+--------------+  RIGHT     Compressibility Phasicity Spontaneity Properties Thrombus Aging  +---------+---------------+---------+-----------+----------+--------------+  CFV       Full            Yes       Yes                                    +---------+---------------+---------+-----------+----------+--------------+  SFJ       Full                                                             +---------+---------------+---------+-----------+----------+--------------+  FV Prox   Full            Yes       Yes                                    +---------+---------------+---------+-----------+----------+--------------+  FV Mid    Full            Yes  Yes                                     +---------+---------------+---------+-----------+----------+--------------+  FV Distal Full            Yes       Yes                                    +---------+---------------+---------+-----------+----------+--------------+  PFV       Full                                                             +---------+---------------+---------+-----------+----------+--------------+  POP       Full            Yes       Yes                                    +---------+---------------+---------+-----------+----------+--------------+  PTV       Full                                                             +---------+---------------+---------+-----------+----------+--------------+  PERO      Full                                                             +---------+---------------+---------+-----------+----------+--------------+   +---------+---------------+---------+-----------+----------+--------------+  LEFT      Compressibility Phasicity Spontaneity Properties Thrombus Aging  +---------+---------------+---------+-----------+----------+--------------+  CFV       Full            Yes       Yes                                    +---------+---------------+---------+-----------+----------+--------------+  SFJ       Full                                                             +---------+---------------+---------+-----------+----------+--------------+  FV Prox   Full            Yes       Yes                                    +---------+---------------+---------+-----------+----------+--------------+  FV Mid    Full  Yes       Yes                                    +---------+---------------+---------+-----------+----------+--------------+  FV Distal Full            Yes       Yes                                    +---------+---------------+---------+-----------+----------+--------------+  PFV       Full                                                              +---------+---------------+---------+-----------+----------+--------------+  POP       Full            Yes       Yes                                    +---------+---------------+---------+-----------+----------+--------------+  PTV       Full                                                             +---------+---------------+---------+-----------+----------+--------------+  PERO      None            No        No                     Acute           +---------+---------------+---------+-----------+----------+--------------+     Summary: BILATERAL: - No evidence of superficial venous thrombosis in the lower extremities, bilaterally. -No evidence of popliteal cyst, bilaterally. RIGHT: - There is no evidence of deep vein thrombosis in the lower extremity.  LEFT: - Findings consistent with acute deep vein thrombosis involving the left peroneal veins.  *See table(s) above for measurements and observations. Electronically signed by Deitra Mayo MD on 09/16/2021 at 1:54:45 PM.    Final     Scheduled Meds:  acyclovir  400 mg Oral Daily   Chlorhexidine Gluconate Cloth  6 each Topical Q0600   cholecalciferol  2,000 Units Oral Daily   folic acid  0.5 mg Oral Daily   losartan  100 mg Oral Daily   And   hydrochlorothiazide  12.5 mg Oral Daily   multivitamin with minerals  1 tablet Oral Daily   potassium chloride  40 mEq Oral BID   vitamin B-12  1,000 mcg Oral Daily   Continuous Infusions:  heparin 1,700 Units/hr (09/17/21 5697)    LOS: 1 day   Kerney Elbe, DO [facility] PAGER is on [place]  If 7PM-7AM, please contact night-coverage [email or web address]

## 2021-09-17 NOTE — Progress Notes (Signed)
Jesse Mcclure.   DOB:1951-07-23   WU#:[id number]    ASSESSMENT & PLAN:  Multiple myeloma without remission ([place]) Myeloma panel is still pending but based on his last set of myeloma panel, he has partial response Due to new acute PE, will need to discontinue Revlimid Continue supportive care for now I plan to see him back within a month in the outpatient clinic to discuss next step   Acute submassive PE Due to immobility from recent knee surgery and Revlimid Ultrasound venous Doppler confirmed DVT in the left lower extremity which is likely the source of his blood clot Echocardiogram is pending Discontinue Revlimid Currently on a heparin drip and will consider switching him to DOAC if stable over the next 24 to 48 hours With stability of his cardiovascular status, I am hopeful he can be transfer out of the ICU today   Pancytopenia, acquired ([place]) He has mild pancytopenia due to Revlimid He is not symptomatic His last B12 level was adequate Observe closely for now There is no contraindication to remain on antiplatelet agents or anticoagulants as long as the platelet is greater than 50,000.   Obesity (BMI 30-39.9) We discussed the importance of weight loss and healthy living He appears motivated to lose some weight after the holidays   Chronic musculoskeletal pain He had recent exacerbation of back pain likely due to changes in his gait after knee surgery He was prescribed oxycodone with relief   Discharge planning I am hopeful he can be discharged over the next 24 to 48 hours  Jesse Lark, MD   All questions were answered. The patient knows to call the clinic with any problems, questions or concerns.   The total time spent in the appointment was 15 minutes encounter with patients including review of chart and various tests results, discussions about plan of care and coordination of care plan  Jesse Lark, MD 09/17/2021 9:01 AM  Subjective:  He feels well.  His leg swelling  is stable.  Denies chest pain or shortness of breath.  No bleeding  Objective:  Vitals:   09/17/21 0700 09/17/21 0800  BP: 111/70 118/73  Pulse: 65 73  Resp: 16 19  Temp:  98.4 F (36.9 C)  SpO2: 95% 98%     Intake/Output Summary (Last 24 hours) at 09/17/2021 0901 Last data filed at 09/17/2021 3888 Gross per 24 hour  Intake 395.58 ml  Output 600 ml  Net -204.42 ml    GENERAL:alert, no distress and comfortable NEURO: alert & oriented x 3 with fluent speech, no focal motor/sensory deficits   Labs:  Recent Labs    08/04/21 0954 09/15/21 1007 09/16/21 0306 09/17/21 0303  NA 139 139 136 133*  K 4.1 3.6 3.5 2.9*  CL 107 105 102 100  CO2 _0 GLUCOSE 88 117* 150* 119*  BUN 17 16 26* 21  CREATININE 0.78 0.78 0.92 0.87  CALCIUM 8.6* 8.4* 8.6* 7.2*  GFRNONAA >60 >60 >60 >60  PROT 6.4* 6.4*  --  6.1*  ALBUMIN 3.5 3.5  --  3.3*  AST 20 20  --  36  ALT 24 27  --  32  ALKPHOS 74 65  --  46  BILITOT 0.9 1.1  --  1.6*    Studies:  DG Chest 2 View  Result Date: 09/16/2021 CLINICAL DATA:  Chest pain EXAM: CHEST - 2 VIEW COMPARISON:  None. FINDINGS: Lungs are clear.  No pleural effusion or pneumothorax. The heart is normal  in size. Degenerative changes of the visualized thoracolumbar spine. IMPRESSION: Normal chest radiographs. Electronically Signed   By: Julian Hy M.D.   On: 09/16/2021 03:19   CT Angio Chest Pulmonary Embolism (PE) W or WO Contrast  Result Date: 09/16/2021 CLINICAL DATA:  Central chest heaviness and shortness of breath. Currently on chemotherapy for multiple myeloma. EXAM: CT ANGIOGRAPHY CHEST WITH CONTRAST TECHNIQUE: Multidetector CT imaging of the chest was performed using the standard protocol during bolus administration of intravenous contrast. Multiplanar CT image reconstructions and MIPs were obtained to evaluate the vascular anatomy. CONTRAST:  52m OMNIPAQUE IOHEXOL 350 MG/ML SOLN COMPARISON:  Chest x-ray from same day. FINDINGS:  Cardiovascular: Acute nonocclusive right upper and lower lobar and segmental pulmonary emboli. RV/LV ratio measures 0.9. Borderline cardiomegaly. Trace pericardial effusion. No thoracic aortic aneurysm or dissection. Coronary, aortic arch, and branch vessel atherosclerotic vascular disease. Mediastinum/Nodes: No enlarged mediastinal, hilar, or axillary lymph nodes. Thyroid gland, trachea, and esophagus demonstrate no significant findings. Lungs/Pleura: Minimal subsegmental atelectasis at the lung bases. No focal consolidation, pleural effusion, or pneumothorax. Upper Abdomen: No acute abnormality. Postsurgical changes at the gastroesophageal junction with recurrent small hiatal hernia. Musculoskeletal: No chest wall abnormality. No acute or significant osseous findings. Review of the MIP images confirms the above findings. IMPRESSION: 1. Acute nonocclusive right upper and lower lobar and segmental pulmonary emboli. Positive for acute PE with CT evidence of right heart strain (RV/LV Ratio = 0.9.) consistent with at least submassive (intermediate risk) PE. The presence of right heart strain has been associated with an increased risk of morbidity and mortality. 2. Aortic Atherosclerosis (ICD10-I70.0). Critical Value/emergent results were called by telephone at the time of interpretation on 09/16/2021 at 8:27 am to provider [facility] who verbally acknowledged these results. Electronically Signed   By: WTitus DubinM.D.   On: 09/16/2021 08:34   VAS [place]OWER EXTREMITY VENOUS (DVT)  Result Date: 09/16/2021  Lower Venous DVT Study Patient Name:  WMilan Mcclure  Date of Exam:   09/16/2021 Medical Rec #: [id number]           Accession #:    [id number] of Birth: [birth year]           Patient Gender: M Patient Age:   89years [facility] [place]OWER EXTREMITY VENOUS (DVT) Referring Phys: TCherylann Ratel --------------------------------------------------------------------------------  Indications: Pulmonary embolism.  Risk Factors: Surgery Left TKR - 07/15/2021. Comparison Study: No previous exams Performing Technologist: Jody Hill RVT, RDMS  Examination Guidelines: A complete evaluation includes B-mode imaging, spectral Doppler, color Doppler, and power Doppler as needed of all accessible portions of each vessel. Bilateral testing is considered an integral part of a complete examination. Limited examinations for reoccurring indications may be performed as noted. The reflux portion of the exam is performed with the patient in reverse Trendelenburg.  +---------+---------------+---------+-----------+----------+--------------+  RIGHT     Compressibility Phasicity Spontaneity Properties Thrombus Aging  +---------+---------------+---------+-----------+----------+--------------+  CFV       Full            Yes       Yes                                    +---------+---------------+---------+-----------+----------+--------------+  SFJ       Full                                                             +---------+---------------+---------+-----------+----------+--------------+  FV Prox   Full            Yes       Yes                                    +---------+---------------+---------+-----------+----------+--------------+  FV Mid    Full            Yes       Yes                                    +---------+---------------+---------+-----------+----------+--------------+  FV Distal Full            Yes       Yes                                    +---------+---------------+---------+-----------+----------+--------------+  PFV       Full                                                             +---------+---------------+---------+-----------+----------+--------------+  POP       Full            Yes       Yes                                    +---------+---------------+---------+-----------+----------+--------------+   PTV       Full                                                             +---------+---------------+---------+-----------+----------+--------------+  PERO      Full                                                             +---------+---------------+---------+-----------+----------+--------------+   +---------+---------------+---------+-----------+----------+--------------+  LEFT      Compressibility Phasicity Spontaneity Properties Thrombus Aging  +---------+---------------+---------+-----------+----------+--------------+  CFV       Full            Yes       Yes                                    +---------+---------------+---------+-----------+----------+--------------+  SFJ       Full                                                             +---------+---------------+---------+-----------+----------+--------------+  FV Prox   Full            Yes       Yes                                    +---------+---------------+---------+-----------+----------+--------------+  FV Mid    Full            Yes       Yes                                    +---------+---------------+---------+-----------+----------+--------------+  FV Distal Full            Yes       Yes                                    +---------+---------------+---------+-----------+----------+--------------+  PFV       Full                                                             +---------+---------------+---------+-----------+----------+--------------+  POP       Full            Yes       Yes                                    +---------+---------------+---------+-----------+----------+--------------+  PTV       Full                                                             +---------+---------------+---------+-----------+----------+--------------+  PERO      None            No        No                     Acute           +---------+---------------+---------+-----------+----------+--------------+     Summary: BILATERAL: - No evidence of  superficial venous thrombosis in the lower extremities, bilaterally. -No evidence of popliteal cyst, bilaterally. RIGHT: - There is no evidence of deep vein thrombosis in the lower extremity.  LEFT: - Findings consistent with acute deep vein thrombosis involving the left peroneal veins.  *See table(s) above for measurements and observations. Electronically signed by Deitra Mayo MD on 09/16/2021 at 1:54:45 PM.    Final

## 2021-09-17 NOTE — Telephone Encounter (Signed)
Called [facility] pharmacy. Per Dr. Alvy Bimler, Revlimid has been discontinued.

## 2021-09-17 NOTE — Progress Notes (Signed)
Transition of Care [facility]) Screening Note  Patient Details  Name: Jesse Mcclure. Date of Birth: [birth year]  Transition of Care [facility]) CM/SW Contact:    Sherie Don, LCSW Phone Number: 09/17/2021, 11:11 AM  Transition of Care Department [facility]) has reviewed patient and no TOC needs have been identified at this time. We will continue to monitor patient advancement through interdisciplinary progression rounds. If new patient transition needs arise, please place a TOC consult.

## 2021-09-17 NOTE — Progress Notes (Signed)
[place] for IV heparin Indication: pulmonary embolus  No Known Allergies  Patient Measurements: Height: '5\' 7"'  (170.2 cm) Weight: 107.2 kg (236 lb 5.3 oz) IBW/kg (Calculated) : 66.1 Heparin Dosing Weight: 91 kg  Vital Signs: Temp: 99.8 F (37.7 C) (12/14 0346) Temp Source: Axillary (12/14 0346) BP: 120/61 (12/14 0400) Pulse Rate: 68 (12/14 0400)  Labs: Recent Labs    09/15/21 1007 09/16/21 0306 09/16/21 0613 09/16/21 1019 09/16/21 1810 09/16/21 1941 09/17/21 0303  HGB 13.0 13.9  --   --   --   --  11.5*  HCT 37.8* 41.1  --   --   --   --  33.2*  PLT 128* 118*  --   --   --   --  93*  HEPARINUNFRC  --   --   --   --  >1.10* 0.36 0.23*  CREATININE 0.78 0.92  --   --   --   --  0.87  TROPONINIHS  --  33* 43* 46*  --   --   --      Estimated Creatinine Clearance (by C-G formula based on SCr of 0.87 mg/dL) Male: 75.8 mL/min Male: 92.2 mL/min   Medical History: Past Medical History:  Diagnosis Date   BPH (benign prostatic hypertrophy)    Degenerative disc disease    Diverticulosis    Hemochromatosis    HTN (hypertension)    Hyperlipidemia    Multiple myeloma (HCC)    Polio    Status post Nissen fundoplication (without gastrostomy tube) procedure    for hiatal hernia.    Medications:  Medications Prior to Admission  Medication Sig Dispense Refill Last Dose   acetaminophen (TYLENOL) 650 MG CR tablet Take 650 mg by mouth every 8 (eight) hours as needed for pain.   Past Week   acyclovir (ZOVIRAX) 400 MG tablet Take 1 tablet (400 mg total) by mouth 2 (two) times daily. (Patient taking differently: Take 400 mg by mouth daily.) 180 tablet 11 09/15/2021 at am   calcium carbonate (TUMS - DOSED IN MG ELEMENTAL CALCIUM) 500 MG chewable tablet Chew 1-2 tablets by mouth daily as needed for heartburn.   Past Week   Cholecalciferol (VITAMIN D) 50 MCG (2000 UT) tablet Take 2,000 Units by mouth daily.   58/52/7782   folic acid  (FOLVITE) 423 MCG tablet Take 400 mcg by mouth daily.   09/15/2021   lenalidomide (REVLIMID) 15 MG capsule Take 1 capsule (15 mg total) by mouth daily. Take 1 capsule for 21 days, then stop for 7 days for a cycle of every 28 days 21 capsule 0 09/15/2021 at am   losartan-hydrochlorothiazide (HYZAAR) 100-12.5 MG tablet Take 1 tablet by mouth daily. 90 tablet 3 09/15/2021 at am   Multiple Vitamin (MULTI-VITAMIN) tablet Take 1 tablet by mouth daily.   09/15/2021 at am   oxyCODONE (ROXICODONE) 5 MG immediate release tablet Take 1 tablet (5 mg total) by mouth every 4 (four) hours as needed for severe pain. 30 tablet 0 09/15/2021 at 1830   Polyethyl Glycol-Propyl Glycol (SYSTANE OP) Place 1 drop into both eyes daily as needed (dry eyes).   Past Week   vitamin B-12 (CYANOCOBALAMIN) 1000 MCG tablet Take 1,000 mcg by mouth daily.   09/15/2021 at am   Scheduled:   acyclovir  400 mg Oral Daily   Chlorhexidine Gluconate Cloth  6 each Topical Q0600   cholecalciferol  2,000 Units Oral Daily   folic acid  0.5  mg Oral Daily   losartan  100 mg Oral Daily   And   hydrochlorothiazide  12.5 mg Oral Daily   multivitamin with minerals  1 tablet Oral Daily   potassium chloride  40 mEq Oral BID   vitamin B-12  1,000 mcg Oral Daily    Assessment: 34 yoM with PMH MM on lenalidomide, recent TKA in October, presents with sudden-onset SOB. CTA reveals multiple right-sided pulmonary emboli with RH strain present. Pharmacy consulted for IV heparin.  Baseline INR, aPTT: not done Prior anticoagulation: none  Significant events:  Today, 09/17/2021: Heparin level 0.23- now subtherapeutic on IV heparin 1550 units/hr CBC: Hgb now slightly below goal at 11.5; Plt low & trending down at 93; recent baseline appears to be ~150k No bleeding or infusion issues per nursing SCr WNL  Goal of Therapy: Heparin level 0.3-0.7 units/ml Monitor platelets by anticoagulation protocol: Yes  Plan: Re-bolus Heparin 2000 units IV  bolus x 1 Increase Heparin IV infusion to 1700 units/hr Check heparin level 8 hrs after rate increase Daily CBC, daily heparin level once stable Monitor for signs of bleeding or thrombosis  Netta Cedars, PharmD, BCPS [phone] 09/17/2021, 5:54 AM

## 2021-09-17 NOTE — Progress Notes (Signed)
[place] for IV heparin Indication: pulmonary embolus  No Known Allergies  Patient Measurements: Height: 5\' 7"  (170.2 cm) Weight: 107.2 kg (236 lb 5.3 oz) IBW/kg (Calculated) : 66.1 Heparin Dosing Weight: 91 kg  Vital Signs: Temp: 98.5 F (36.9 C) (12/14 1200) Temp Source: Oral (12/14 1200) BP: 110/55 (12/14 1300) Pulse Rate: 63 (12/14 1300)  Labs: Recent Labs    09/15/21 1007 09/16/21 0306 09/16/21 0613 09/16/21 1019 09/16/21 1810 09/16/21 1941 09/17/21 0303 09/17/21 1405  HGB 13.0 13.9  --   --   --   --  11.5*  --   HCT 37.8* 41.1  --   --   --   --  33.2*  --   PLT 128* 118*  --   --   --   --  93*  --   HEPARINUNFRC  --   --   --   --    < > 0.36 0.23* 0.53  CREATININE 0.78 0.92  --   --   --   --  0.87  --   TROPONINIHS  --  33* 43* 46*  --   --   --   --    < > = values in this interval not displayed.     Estimated Creatinine Clearance (by C-G formula based on SCr of 0.87 mg/dL) Male: 75.8 mL/min Male: 92.2 mL/min   Assessment: 53 yoM with PMH MM on lenalidomide, recent TKA in October, presents with sudden-onset SOB. CTA reveals multiple right-sided pulmonary emboli with RH strain present. Pharmacy consulted for IV heparin.  Baseline INR, aPTT: not done Prior anticoagulation: none  Significant events:  Today, 09/17/2021: Heparin level up to 0.53 - therapeutic after re-bolus  IV heparin 2000 units and increasing drip to 1700 units/hr CBC: Hgb now slightly low at 11.5; Plt low & trending down at 93; recent baseline appears to be ~150k No bleeding or infusion issues per nursing SCr WNL  Goal of Therapy: Heparin level 0.3-0.7 units/ml Monitor platelets by anticoagulation protocol: Yes  Plan: Continue Heparin IV infusion @ 1700 units/hr Daily CBC, daily heparin level  Monitor for signs of bleeding or thrombosis F/u transition to [place], Pharm.D 09/17/2021 2:47 PM

## 2021-09-17 NOTE — Progress Notes (Signed)
Echocardiogram 2D Echocardiogram has been performed.  Oneal Deputy Cathrine Krizan RDCS 09/17/2021, 8:37 AM

## 2021-09-18 ENCOUNTER — Encounter: Payer: Self-pay | Admitting: Hematology and Oncology

## 2021-09-18 ENCOUNTER — Telehealth: Payer: Self-pay | Admitting: Hematology and Oncology

## 2021-09-18 ENCOUNTER — Other Ambulatory Visit (HOSPITAL_COMMUNITY): Payer: Self-pay

## 2021-09-18 DIAGNOSIS — D61818 Other pancytopenia: Secondary | ICD-10-CM

## 2021-09-18 LAB — MULTIPLE MYELOMA PANEL, SERUM
Albumin SerPl Elph-Mcnc: 3.5 g/dL (ref 2.9–4.4)
Albumin/Glob SerPl: 1.3 (ref 0.7–1.7)
Alpha 1: 0.3 g/dL (ref 0.0–0.4)
Alpha2 Glob SerPl Elph-Mcnc: 0.6 g/dL (ref 0.4–1.0)
B-Globulin SerPl Elph-Mcnc: 0.8 g/dL (ref 0.7–1.3)
Gamma Glob SerPl Elph-Mcnc: 0.9 g/dL (ref 0.4–1.8)
Globulin, Total: 2.7 g/dL (ref 2.2–3.9)
IgA: 117 mg/dL (ref 61–437)
IgG (Immunoglobin G), Serum: 972 mg/dL (ref 603–1613)
IgM (Immunoglobulin M), Srm: 41 mg/dL (ref 20–172)
M Protein SerPl Elph-Mcnc: 0.3 g/dL — ABNORMAL HIGH
Total Protein ELP: 6.2 g/dL (ref 6.0–8.5)

## 2021-09-18 LAB — CBC WITH DIFFERENTIAL/PLATELET
Abs Immature Granulocytes: 0.01 10*3/uL (ref 0.00–0.07)
Basophils Absolute: 0 10*3/uL (ref 0.0–0.1)
Basophils Relative: 0 %
Eosinophils Absolute: 0.3 10*3/uL (ref 0.0–0.5)
Eosinophils Relative: 9 %
HCT: 37.5 % — ABNORMAL LOW (ref 39.0–52.0)
Hemoglobin: 12.7 g/dL — ABNORMAL LOW (ref 13.0–17.0)
Immature Granulocytes: 0 %
Lymphocytes Relative: 33 %
Lymphs Abs: 1 10*3/uL (ref 0.7–4.0)
MCH: 37.5 pg — ABNORMAL HIGH (ref 26.0–34.0)
MCHC: 33.9 g/dL (ref 30.0–36.0)
MCV: 110.6 fL — ABNORMAL HIGH (ref 80.0–100.0)
Monocytes Absolute: 0.4 10*3/uL (ref 0.1–1.0)
Monocytes Relative: 14 %
Neutro Abs: 1.3 10*3/uL — ABNORMAL LOW (ref 1.7–7.7)
Neutrophils Relative %: 44 %
Platelets: 102 10*3/uL — ABNORMAL LOW (ref 150–400)
RBC: 3.39 MIL/uL — ABNORMAL LOW (ref 4.22–5.81)
RDW: 13.7 % (ref 11.5–15.5)
WBC: 3 10*3/uL — ABNORMAL LOW (ref 4.0–10.5)
nRBC: 0 % (ref 0.0–0.2)

## 2021-09-18 LAB — COMPREHENSIVE METABOLIC PANEL
ALT: 44 U/L (ref 0–44)
AST: 42 U/L — ABNORMAL HIGH (ref 15–41)
Albumin: 3.4 g/dL — ABNORMAL LOW (ref 3.5–5.0)
Alkaline Phosphatase: 51 U/L (ref 38–126)
Anion gap: 10 (ref 5–15)
BUN: 16 mg/dL (ref 8–23)
CO2: 24 mmol/L (ref 22–32)
Calcium: 7.5 mg/dL — ABNORMAL LOW (ref 8.9–10.3)
Chloride: 100 mmol/L (ref 98–111)
Creatinine, Ser: 0.78 mg/dL (ref 0.61–1.24)
GFR, Estimated: >60 mL/min (ref >60)
Glucose, Bld: 117 mg/dL — ABNORMAL HIGH (ref 70–99)
Potassium: 3.5 mmol/L (ref 3.5–5.1)
Sodium: 134 mmol/L — ABNORMAL LOW (ref 135–145)
Total Bilirubin: 1.2 mg/dL (ref 0.3–1.2)
Total Protein: 6.4 g/dL — ABNORMAL LOW (ref 6.5–8.1)

## 2021-09-18 LAB — HEPARIN LEVEL (UNFRACTIONATED): Heparin Unfractionated: 0.46 IU/mL (ref 0.30–0.70)

## 2021-09-18 LAB — PHOSPHORUS: Phosphorus: 2.5 mg/dL (ref 2.5–4.6)

## 2021-09-18 LAB — MAGNESIUM: Magnesium: 2.5 mg/dL — ABNORMAL HIGH (ref 1.7–2.4)

## 2021-09-18 MED ORDER — POTASSIUM CHLORIDE CRYS ER 20 MEQ PO TBCR
40.0000 meq | EXTENDED_RELEASE_TABLET | Freq: Once | ORAL | Status: AC
  Administered 2021-09-18: 40 meq via ORAL
  Filled 2021-09-18: qty 2

## 2021-09-18 MED ORDER — RIVAROXABAN 15 MG PO TABS
15.0000 mg | ORAL_TABLET | Freq: Two times a day (BID) | ORAL | Status: DC
  Administered 2021-09-18: 15 mg via ORAL
  Filled 2021-09-18: qty 1

## 2021-09-18 MED ORDER — RIVAROXABAN (XARELTO) VTE STARTER PACK (15 & 20 MG)
ORAL_TABLET | ORAL | 0 refills | Status: DC
  Filled 2021-09-18: qty 51, 30d supply, fill #0

## 2021-09-18 MED ORDER — ONDANSETRON HCL 4 MG PO TABS
4.0000 mg | ORAL_TABLET | Freq: Four times a day (QID) | ORAL | 0 refills | Status: DC | PRN
Start: 2021-09-18 — End: 2022-03-19

## 2021-09-18 MED ORDER — RIVAROXABAN 20 MG PO TABS: 20.0000 mg | ORAL_TABLET | Freq: Every day | ORAL | Status: DC

## 2021-09-18 NOTE — Progress Notes (Signed)
Pt educated on discharge medications and instructions. Pt IVs removed. Pt bp 128/98, HR 106, O2 95% on RA. Pt notified his ride.

## 2021-09-18 NOTE — Discharge Instructions (Signed)
Information on my medicine - XARELTO (rivaroxaban)  This medication education was reviewed with me or my healthcare representative as part of my discharge preparation.  WHY WAS XARELTO PRESCRIBED FOR YOU? Xarelto was prescribed to treat blood clots that may have been found in the veins of your legs (deep vein thrombosis) or in your lungs (pulmonary embolism) and to reduce the risk of them occurring again.  What do you need to know about Xarelto? The starting dose is one 15 mg tablet taken TWICE daily with food for the FIRST 21 DAYS then on 10/09/2021 or after all the 15 mg tablets have been taken  the dose is changed to one 20 mg tablet taken ONCE A DAY with your evening meal.  DO NOT stop taking Xarelto without talking to the health care provider who prescribed the medication.  Refill your prescription for 20 mg tablets before you run out.  After discharge, you should have regular check-up appointments with your healthcare provider that is prescribing your Xarelto.  In the future your dose may need to be changed if your kidney function changes by a significant amount.  What do you do if you miss a dose? If you are taking Xarelto TWICE DAILY and you miss a dose, take it as soon as you remember. You may take two 15 mg tablets (total 30 mg) at the same time then resume your regularly scheduled 15 mg twice daily the next day.  If you are taking Xarelto ONCE DAILY and you miss a dose, take it as soon as you remember on the same day then continue your regularly scheduled once daily regimen the next day. Do not take two doses of Xarelto at the same time.   Important Safety Information Xarelto is a blood thinner medicine that can cause bleeding. You should call your healthcare provider right away if you experience any of the following: Bleeding from an injury or your nose that does not stop. Unusual colored urine (red or dark brown) or unusual colored stools (red or black). Unusual bruising for  unknown reasons. A serious fall or if you hit your head (even if there is no bleeding).  Some medicines may interact with Xarelto and might increase your risk of bleeding while on Xarelto. To help avoid this, consult your healthcare provider or pharmacist prior to using any new prescription or non-prescription medications, including herbals, vitamins, non-steroidal anti-inflammatory drugs (NSAIDs) and supplements.  This website has more information on Xarelto: [email or web address]-benson.[email or web address]

## 2021-09-18 NOTE — Progress Notes (Signed)
[place] for IV heparin>>Xarelto Indication: pulmonary embolus  No Known Allergies  Patient Measurements: Height: 5\' 7"  (170.2 cm) Weight: 107.2 kg (236 lb 5.3 oz) IBW/kg (Calculated) : 66.1 Heparin Dosing Weight: 91 kg  Vital Signs: Temp: 97.8 F (36.6 C) (12/15 0326) Temp Source: Oral (12/15 0326) BP: 115/62 (12/15 0600) Pulse Rate: 54 (12/15 0600)  Labs: Recent Labs    09/16/21 0306 09/16/21 0613 09/16/21 1019 09/16/21 1810 09/17/21 0303 09/17/21 1405 09/18/21 0242  HGB 13.9  --   --   --  11.5*  --  12.7*  HCT 41.1  --   --   --  33.2*  --  37.5*  PLT 118*  --   --   --  93*  --  102*  HEPARINUNFRC  --   --   --    < > 0.23* 0.53 0.46  CREATININE 0.92  --   --   --  0.87  --  0.78  TROPONINIHS 33* 43* 46*  --   --   --   --    < > = values in this interval not displayed.     Estimated Creatinine Clearance (by C-G formula based on SCr of 0.78 mg/dL) Male: 82.4 mL/min Male: 100.3 mL/min   Assessment: 28 yoM with PMH MM on lenalidomide, recent TKA in October, presents with sudden-onset SOB. CTA reveals multiple right-sided pulmonary emboli with RH strain present. Pharmacy consulted for IV heparin.  Baseline INR, aPTT: not done Prior anticoagulation: none  Significant events:  Today, 09/18/2021: Heparin level 0.46 - therapeutic on heparin drip @ 1700 units/hr CBC:HG 12.7, PLT 102 No bleeding or infusion issues per nursing SCr WNL To transition UFH to Xarelto for PE/DVT  Goal of Therapy: Heparin level 0.3-0.7 units/ml Monitor platelets by anticoagulation protocol: Yes  Plan: Dc heparin drip Xarelto 15 mg po bid with food x 21 days followed by xarelto 20 mg qsupper DC CBC/heparin levels Will educate and give 30 day free card prior to discharge  Eudelia Bunch, Pharm.D 09/18/2021 8:21 AM

## 2021-09-18 NOTE — Telephone Encounter (Signed)
Scheduled appointment per 12/14 scheduling message. Patient is aware.

## 2021-09-18 NOTE — Progress Notes (Addendum)
Jesse Mcclure.   DOB:[birth year]   XI#:[id number]    I have seen him and agree with notes as follows ASSESSMENT & PLAN:  Multiple myeloma without remission ([place]) Myeloma panel is still pending but based on his last set of myeloma panel, he has partial response Due to new acute PE, will need to discontinue Revlimid Continue supportive care for now I plan to see him back within a month in the outpatient clinic to discuss next step, scheduler will call him   Acute submassive PE Due to immobility from recent knee surgery and Revlimid Ultrasound venous Doppler confirmed DVT in the left lower extremity which is likely the source of his blood clot Echocardiogram showed no signs of cardiomyopathy Discontinue Revlimid Heparin has been stopped and he will transition to Xarelto today   Pancytopenia, acquired ([place]) He has mild pancytopenia due to Revlimid He is not symptomatic His last B12 level was adequate Observe closely for now There is no contraindication to remain on antiplatelet agents or anticoagulants as long as the platelet is greater than 50,000.   Obesity (BMI 30-39.9) We discussed the importance of weight loss and healthy living He appears motivated to lose some weight after the holidays   Chronic musculoskeletal pain He had recent exacerbation of back pain likely due to changes in his gait after knee surgery He was prescribed oxycodone with relief   Discharge planning Will likely be discharged later today  Mikey Bussing, NP 09/18/2021 10:15 AM Heath Lark, MD Subjective:  Feels well this morning Denies chest pain, shortness of breath, back pain Bowels moved early this morning No bleeding Heparin has been stopped he will be starting Xarelto today He tells me that he plans to ambulate in the hallway with nursing staff and then will likely go home later today  Objective:  Vitals:   09/18/21 0600 09/18/21 0800  BP: 115/62   Pulse: (!) 54   Resp: 17   Temp:  98.3 F  (36.8 C)  SpO2: 95%      Intake/Output Summary (Last 24 hours) at 09/18/2021 1015 Last data filed at 09/18/2021 0900 Gross per 24 hour  Intake 701.36 ml  Output 2100 ml  Net -1398.64 ml    GENERAL:alert, no distress and comfortable NEURO: alert & oriented x 3 with fluent speech, no focal motor/sensory deficits   Labs:  Recent Labs    09/15/21 1007 09/16/21 0306 09/17/21 0303 09/18/21 0242  NA 139 136 133* 134*  K 3.6 3.5 2.9* 3.5  CL 105 102 100 100  CO2 _0 GLUCOSE 117* 150* 119* 117*  BUN 16 26* 21 16  CREATININE 0.78 0.92 0.87 0.78  CALCIUM 8.4* 8.6* 7.2* 7.5*  GFRNONAA >60 >60 >60 >60  PROT 6.4*  --  6.1* 6.4*  ALBUMIN 3.5  --  3.3* 3.4*  AST 20  --  36 42*  ALT 27  --  32 44  ALKPHOS 65  --  46 51  BILITOT 1.1  --  1.6* 1.2    Studies:  DG Chest 2 View  Result Date: 09/16/2021 CLINICAL DATA:  Chest pain EXAM: CHEST - 2 VIEW COMPARISON:  None. FINDINGS: Lungs are clear.  No pleural effusion or pneumothorax. The heart is normal in size. Degenerative changes of the visualized thoracolumbar spine. IMPRESSION: Normal chest radiographs. Electronically Signed   By: Julian Hy M.D.   On: 09/16/2021 03:19   CT Angio Chest Pulmonary Embolism (PE) W or WO Contrast  Result  Date: 09/16/2021 °CLINICAL DATA:  Central chest heaviness and shortness of breath. Currently on chemotherapy for multiple myeloma. EXAM: CT ANGIOGRAPHY CHEST WITH CONTRAST TECHNIQUE: Multidetector CT imaging of the chest was performed using the standard protocol during bolus administration of intravenous contrast. Multiplanar CT image reconstructions and MIPs were obtained to evaluate the vascular anatomy. CONTRAST:  80mL OMNIPAQUE IOHEXOL 350 MG/ML SOLN COMPARISON:  Chest x-ray from same day. FINDINGS: Cardiovascular: Acute nonocclusive right upper and lower lobar and segmental pulmonary emboli. RV/LV ratio measures 0.9. Borderline cardiomegaly. Trace pericardial effusion. No thoracic  aortic aneurysm or dissection. Coronary, aortic arch, and branch vessel atherosclerotic vascular disease. Mediastinum/Nodes: No enlarged mediastinal, hilar, or axillary lymph nodes. Thyroid gland, trachea, and esophagus demonstrate no significant findings. Lungs/Pleura: Minimal subsegmental atelectasis at the lung bases. No focal consolidation, pleural effusion, or pneumothorax. Upper Abdomen: No acute abnormality. Postsurgical changes at the gastroesophageal junction with recurrent small hiatal hernia. Musculoskeletal: No chest wall abnormality. No acute or significant osseous findings. Review of the MIP images confirms the above findings. IMPRESSION: 1. Acute nonocclusive right upper and lower lobar and segmental pulmonary emboli. Positive for acute PE with CT evidence of right heart strain (RV/LV Ratio = 0.9.) consistent with at least submassive (intermediate risk) PE. The presence of right heart strain has been associated with an increased risk of morbidity and mortality. 2. Aortic Atherosclerosis (ICD10-I70.0). Critical Value/emergent results were called by telephone at the time of interpretation on 09/16/2021 at 8:27 am to provider MADISON KOMMOR, who verbally acknowledged these results. Electronically Signed   By: Yasmin T Derry M.D.   On: 09/16/2021 08:34  ° °ECHOCARDIOGRAM COMPLETE ° °Result Date: 09/17/2021 °   ECHOCARDIOGRAM REPORT   Patient Name:   Jesse J Falotico Jr. Date of Exam: 09/17/2021 Medical Rec #:  [id number]           Height:       67.0 in Accession #:    [id number]          Weight:       236.3 lb Date of Birth:  10/29/1950           BSA:          2.171 m² Patient Age:    70 years            BP:           118/73 mmHg Patient Gender: M                   HR:           75 bpm. Exam Location:  Inpatient Procedure: 2D Echo, Color Doppler and Cardiac Doppler Indications:    I26.02 Pulmonary embolus  History:        Patient has prior history of Echocardiogram examinations, most                 recent  07/19/2019. Risk Factors:Hypertension and Dyslipidemia.                 Prior performed at [facility].  Sonographer:    Emily Senior RDCS Referring Phys: [id number] TYRONE A KYLE  Sonographer Comments: Very poor apical window due to lung interference IMPRESSIONS  1. Left ventricular ejection fraction, by estimation, is 55 to 60%. The left ventricle has normal function. Left ventricular endocardial border not optimally defined to evaluate regional wall motion, thought no wall motion abnormalities seen in the short axis series.There is mild concentric left ventricular hypertrophy. Left ventricular diastolic parameters are indeterminate.    2. Right ventricular systolic function is normal. The right ventricular size is normal. Tricuspid regurgitation signal is inadequate for assessing PA pressure.  3. Left atrial size was moderately dilated.  4. Right atrial size was mildly dilated.  5. A small pericardial effusion is present.  6. The mitral valve is grossly normal. No evidence of mitral valve regurgitation. No evidence of mitral stenosis.  7. The aortic valve was not well visualized. Aortic valve regurgitation is not visualized. Aortic valve sclerosis is present, with no evidence of aortic valve stenosis. Comparison(s): No prior Echocardiogram. FINDINGS  Left Ventricle: Left ventricular ejection fraction, by estimation, is 55 to 60%. The left ventricle has normal function. Left ventricular endocardial border not optimally defined to evaluate regional wall motion. The left ventricular internal cavity size was normal in size. There is mild concentric left ventricular hypertrophy. Left ventricular diastolic parameters are indeterminate. Right Ventricle: The right ventricular size is normal. No increase in right ventricular wall thickness. Right ventricular systolic function is normal. Tricuspid regurgitation signal is inadequate for assessing PA pressure. Left Atrium: Left atrial size was moderately dilated. Right Atrium: Right  atrial size was mildly dilated. Pericardium: A small pericardial effusion is present. Mitral Valve: The mitral valve is grossly normal. No evidence of mitral valve regurgitation. No evidence of mitral valve stenosis. Tricuspid Valve: The tricuspid valve is grossly normal. Tricuspid valve regurgitation is not demonstrated. Aortic Valve: The aortic valve was not well visualized. Aortic valve regurgitation is not visualized. Aortic valve sclerosis is present, with no evidence of aortic valve stenosis. Pulmonic Valve: The pulmonic valve was not well visualized. Pulmonic valve regurgitation is not visualized. Aorta: The aortic root and ascending aorta are structurally normal, with no evidence of dilitation. IAS/Shunts: The atrial septum is grossly normal.  LEFT VENTRICLE PLAX 2D LVIDd:         4.00 cm   Diastology LVIDs:         2.50 cm   LV e' medial:    6.53 cm/s LV PW:         1.20 cm   LV E/e' medial:  11.3 LV IVS:        1.20 cm   LV e' lateral:   10.70 cm/s LVOT diam:     2.10 cm   LV E/e' lateral: 6.9 LV SV:         62 LV SV Index:   29 LVOT Area:     3.46 cm²  RIGHT VENTRICLE RV S prime:     10.60 cm/s TAPSE (M-mode): 2.1 cm LEFT ATRIUM             Index        RIGHT ATRIUM           Index LA diam:        4.30 cm 1.98 cm/m²   RA Area:     24.40 cm² LA Vol (A2C):   93.9 ml 43.25 ml/m²  RA Volume:   78.40 ml  36.11 ml/m² LA Vol (A4C):   90.7 ml 41.78 ml/m² LA Biplane Vol: 96.6 ml 44.50 ml/m²  AORTIC VALVE LVOT Vmax:   97.90 cm/s LVOT Vmean:  68.000 cm/s LVOT VTI:    0.179 m  AORTA Ao Root diam: 3.50 cm Ao Asc diam:  2.80 cm MITRAL VALVE MV Area (PHT): 3.30 cm²    SHUNTS MV Decel Time: 230 msec    Systemic VTI:  0.18 m MV E velocity: 73.70 cm/s  Systemic Diam: 2.10 cm MV A   velocity: 56.10 cm/s MV E/A ratio:  1.31 Mahesh Chandrasekhar MD Electronically signed by Mahesh Chandrasekhar MD Signature Date/Time: 09/17/2021/12:56:54 PM    Final   ° °VAS US LOWER EXTREMITY VENOUS (DVT) ° °Result Date: 09/16/2021 ° Lower  Venous DVT Study Patient Name:  Jesse J Lanphear Jr.  Date of Exam:   09/16/2021 Medical Rec #: [id number]            Accession #:    [id number] Date of Birth: 11/09/1950            Patient Gender: M Patient Age:   70 years Exam Location:  [facility] Procedure:      VAS US LOWER EXTREMITY VENOUS (DVT) Referring Phys: TYRONE KYLE --------------------------------------------------------------------------------  Indications: Pulmonary embolism.  Risk Factors: Surgery Left TKR - 07/15/2021. Comparison Study: No previous exams Performing Technologist: Jody Hill RVT, RDMS  Examination Guidelines: A complete evaluation includes B-mode imaging, spectral Doppler, color Doppler, and power Doppler as needed of all accessible portions of each vessel. Bilateral testing is considered an integral part of a complete examination. Limited examinations for reoccurring indications may be performed as noted. The reflux portion of the exam is performed with the patient in reverse Trendelenburg.  +---------+---------------+---------+-----------+----------+--------------+  RIGHT     Compressibility Phasicity Spontaneity Properties Thrombus Aging  +---------+---------------+---------+-----------+----------+--------------+  CFV       Full            Yes       Yes                                    +---------+---------------+---------+-----------+----------+--------------+  SFJ       Full                                                             +---------+---------------+---------+-----------+----------+--------------+  FV Prox   Full            Yes       Yes                                    +---------+---------------+---------+-----------+----------+--------------+  FV Mid    Full            Yes       Yes                                    +---------+---------------+---------+-----------+----------+--------------+  FV Distal Full            Yes       Yes                                     +---------+---------------+---------+-----------+----------+--------------+  PFV       Full                                                             +---------+---------------+---------+-----------+----------+--------------+    POP       Full            Yes       Yes                                    +---------+---------------+---------+-----------+----------+--------------+  PTV       Full                                                             +---------+---------------+---------+-----------+----------+--------------+  PERO      Full                                                             +---------+---------------+---------+-----------+----------+--------------+   +---------+---------------+---------+-----------+----------+--------------+  LEFT      Compressibility Phasicity Spontaneity Properties Thrombus Aging  +---------+---------------+---------+-----------+----------+--------------+  CFV       Full            Yes       Yes                                    +---------+---------------+---------+-----------+----------+--------------+  SFJ       Full                                                             +---------+---------------+---------+-----------+----------+--------------+  FV Prox   Full            Yes       Yes                                    +---------+---------------+---------+-----------+----------+--------------+  FV Mid    Full            Yes       Yes                                    +---------+---------------+---------+-----------+----------+--------------+  FV Distal Full            Yes       Yes                                    +---------+---------------+---------+-----------+----------+--------------+  PFV       Full                                                             +---------+---------------+---------+-----------+----------+--------------+    POP       Full            Yes       Yes                                     +---------+---------------+---------+-----------+----------+--------------+  PTV       Full                                                             +---------+---------------+---------+-----------+----------+--------------+  PERO      None            No        No                     Acute           +---------+---------------+---------+-----------+----------+--------------+     Summary: BILATERAL: - No evidence of superficial venous thrombosis in the lower extremities, bilaterally. -No evidence of popliteal cyst, bilaterally. RIGHT: - There is no evidence of deep vein thrombosis in the lower extremity.  LEFT: - Findings consistent with acute deep vein thrombosis involving the left peroneal veins.  *See table(s) above for measurements and observations. Electronically signed by Christopher Dickson MD on 09/16/2021 at 1:54:45 PM.    Final    ° °

## 2021-09-18 NOTE — Progress Notes (Signed)
°  Transition of Care [facility] Screening Note   Patient Details  Name: Jesse Mcclure. Date of Birth: Feb 19, 1951   Transition of Care [facility]) CM/SW Contact:    Dessa Phi, RN Phone Number: 09/18/2021, 12:31 PM    Transition of Care Department [facility]) has reviewed patient and no TOC needs have been identified at this time. We will continue to monitor patient advancement through interdisciplinary progression rounds. If new patient transition needs arise, please place a TOC consult.

## 2021-09-18 NOTE — Discharge Summary (Signed)
Physician Discharge Summary  Jesse Mcclure. TTS:[id number] DOB: 08-30-1951 DOA: 09/16/2021  PCP: Denita Lung, MD  Admit date: 09/16/2021 Discharge date: 09/19/2021  Admitted From: Home Disposition: Home  Recommendations for Outpatient Follow-up:  Follow up with PCP in 1-2 weeks Follow up with Medical Oncology within 1-2 weeks Follow-up with orthopedic surgeon outpatient setting Please obtain CMP/CBC, Mag, Phos in one week Please follow up on the following pending results:  Home Health: No  Equipment/Devices: None    Discharge Condition: Stable  CODE STATUS: FULL CODE Diet recommendation: Heart Healthy Diet   Brief/Interim Summary: The patient Is a 70 year old Caucasian obese male with a past medical history significant for but not limited to multiple myeloma on chemotherapy, hypertension, osteoarthritis and recent knee surgery as well as other comorbidities who presented to the ED with chest pain and dyspnea.  Symptoms started around midnight and he states that he had sharp pain left side of his chest and it was difficult to breathe.  He tried oxycodone but it did not help and he never had pain like this before and so because of this he became concerned and came to the ED for help.  Further work-up was done and CT of the chest done and showed submassive PE with right heart strain and he was started on heparin drip and further worked up.  Pulmonary and oncology were consulted and his Revlimid has been discontinued.  Because he was hemodynamically stable pulmonary recommended no indication for systemic lysis or catheter directed lysis.  We will continue the patient on a heparin drip and then transitioned to p.o. Xarelto.  He ambulated without issues and did not desaturate.  He is doing medically stable to be discharged home and will follow up with his PCP as well as medical oncologist in outpatient setting and he was told to hold his Revlimid until he sees his oncologist.  Discharge  Diagnoses:  Principal Problem:   Acute pulmonary embolism ([place])  Acute submassive PE and right upper and lower segmental pulmonary with Lower Extremity DVT -In the setting of his recent knee surgery back in October as well as Revlimid -We will extremity venous duplex did show acute DVT in the peroneal veins -Echocardiogram has been done and showed "Left ventricular ejection fraction, by estimation, is 55 to 60%. The left ventricle has normal function. Left ventricular endocardial border not optimally defined to evaluate regional wall motion. The left ventricular internal cavity  size was normal in size. There is mild concentric left ventricular hypertrophy. Left ventricular diastolic parameters are indeterminate." Also showed noraml RV Systolic Function  -He was initiated on heparin drip and switched to Xarelto as below -We will transfer out of the ICU to the medical floor -Troponin was mildly elevated and trended up from 33 -> 43 -> 46 -Patient would like to go on Xarelto dosing and this was initiated today and will be given a free 30-day card -Patient will need an ambulatory home O2 screen prior to discharge -Patient did have some chest discomfort yesterday but this is improved; he is on pain control with oxycodone 5 mg every 4 hours as needed severe pain as well as IV morphine 2 mg every 2 as needed severe pain -Pulmonary is consulted and recommending no indication for systemic lysis or catheter directed lysis -Per pulmonary if patient has further decline to include hemodynamic instability or increasing oxygen requirements today we will reconsider any additional intervention that may need at that time -Ambulatory home O2 screen done  prior to discharge and patient did not desaturate °-Follow-up in outpatient setting as patient is medically stable to be discharged °  °Hypertension °-Continuing current home medications including losartan/hydrochlorothiazide 100-12.5 mg p.o. daily °-Continue to  monitor blood pressures per protocol °-Last blood pressure reading was 129/98 prior to discharge °  °Pancytopenia °History of bone marrow transplant °-Had a mild pancytopenia in the setting of his recent surgery and chemotherapy °-Patient's CBC now improved showing a WBC of 3.0, hemoglobin/hematocrit 12.7/37.5 and a platelet count of 102 °-Currently not symptomatic °-Transfuse for hemoglobin less than 7 and continue to monitor for signs and symptoms °-Repeat CBC within 1 week at PCP office and oncology office ° °Elevated AST °-Mild. AST went from 36 -> 42 °-Continue to Monitor and Trend as it is very mild °-Repeat CMP within 1-2 weeks  °  °MM with out remission °-Medical oncology has been consulted and recommending discontinuing Revlimid due to his new acute PE and recommending continued supportive care °-We will continue acyclovir 400 mg p.o. daily for prophylaxis °-Myeloma panel still pending but per the oncologist based on his last set of myeloma panels he did have a partial response °-Medical oncology recommending seeing the patient back within a month in outpatient clinic to discuss next steps °  °Hypokalemia °-Mild at 2.9 yesterday and improved treatment with p.o. KCl 40 mEq twice daily x2 doses and Mag with IV Mag Sulfate 2 grams °-K+ is 3.5 today and will replete again with po Kcl 40 mEQ x1 prior to discharge °-Continue to monitor and replete as necessary °-Repeat CMP in a.m. °  °Hyperbilirubinemia °-Mild and improved.  Patient's T bili is now 1.6 -> 1.2 °-Continue to monitor and trend and repeat CMP in a.m. °  °Hyperglycemia °-Likely reactive as patient's hemoglobin A1c was 5.0 °-Continue monitor blood sugars carefully and if necessary will place on sensitive NovoLog sliding scale insulin AC °  °Hyponatremia °-Mild and patient's sodium dropped from 136 and is now 133 -> 134 °-Continue to monitor and trend and repeat CMP within 1 week  °  °Obesity °-Complicates overall prognosis and care °-Estimated body  mass index is 37.02 kg/m² as calculated from the following: °  Height as of this encounter: 5' 7" (1.702 m). °  Weight as of this encounter: 107.2 kg.  °-Weight Loss and Dietary Counseling given ° °Discharge Instructions ° °Discharge Instructions   ° ° Call MD for:  difficulty breathing, headache or visual disturbances   Complete by: As directed °  ° Call MD for:  extreme fatigue   Complete by: As directed °  ° Call MD for:  hives   Complete by: As directed °  ° Call MD for:  persistant dizziness or light-headedness   Complete by: As directed °  ° Call MD for:  persistant nausea and vomiting   Complete by: As directed °  ° Call MD for:  redness, tenderness, or signs of infection (pain, swelling, redness, odor or green/yellow discharge around incision site)   Complete by: As directed °  ° Call MD for:  severe uncontrolled pain   Complete by: As directed °  ° Call MD for:  temperature >100.4   Complete by: As directed °  ° Diet - low sodium heart healthy   Complete by: As directed °  ° Discharge instructions   Complete by: As directed °  ° You were cared for by a hospitalist during your hospital stay. If you have any questions about your discharge medications or   the care you received while you were in the hospital after you are discharged, you can call the unit and ask to speak with the hospitalist on call if the hospitalist that took care of you is not available. Once you are discharged, your primary care physician will handle any further medical issues. Please note that NO REFILLS for any discharge medications will be authorized once you are discharged, as it is imperative that you return to your primary care physician (or establish a relationship with a primary care physician if you do not have one) for your aftercare needs so that they can reassess your need for medications and monitor your lab values. ° °Follow up with PCP and Medical Oncology within 1-2 weeks. Take all medications as prescribed. If symptoms  change or worsen please return to the ED for evaluation  ° Increase activity slowly   Complete by: As directed °  ° °  ° °Allergies as of 09/18/2021   °No Known Allergies °  ° °  °Medication List  °  ° °STOP taking these medications   ° °lenalidomide 15 MG capsule °Commonly known as: REVLIMID °  ° °  ° °TAKE these medications   ° °acetaminophen 650 MG CR tablet °Commonly known as: TYLENOL °Take 650 mg by mouth every 8 (eight) hours as needed for pain. °  °acyclovir 400 MG tablet °Commonly known as: ZOVIRAX °Take 1 tablet (400 mg total) by mouth 2 (two) times daily. °What changed: when to take this °  °calcium carbonate 500 MG chewable tablet °Commonly known as: TUMS - dosed in mg elemental calcium °Chew 1-2 tablets by mouth daily as needed for heartburn. °  °folic acid 400 MCG tablet °Commonly known as: FOLVITE °Take 400 mcg by mouth daily. °  °losartan-hydrochlorothiazide 100-12.5 MG tablet °Commonly known as: HYZAAR °Take 1 tablet by mouth daily. °  °Multi-Vitamin tablet °Take 1 tablet by mouth daily. °  °ondansetron 4 MG tablet °Commonly known as: ZOFRAN °Take 1 tablet (4 mg total) by mouth every 6 (six) hours as needed for nausea. °  °oxyCODONE 5 MG immediate release tablet °Commonly known as: Roxicodone °Take 1 tablet (5 mg total) by mouth every 4 (four) hours as needed for severe pain. °  °SYSTANE OP °Place 1 drop into both eyes daily as needed (dry eyes). °  °vitamin B-12 1000 MCG tablet °Commonly known as: CYANOCOBALAMIN °Take 1,000 mcg by mouth daily. °  °Vitamin D 50 MCG (2000 UT) tablet °Take 2,000 Units by mouth daily. °  °Xarelto Starter Pack °Generic drug: Rivaroxaban Stater Pack (15 mg and 20 mg) °Follow package directions: Take 1 15mg tablet by mouth twice a day. On day 22, switch to one 20mg tablet once a day. Take with food. °  ° °  ° ° °No Known Allergies ° °Consultations: °Pulmonary  °Medical Oncology  ° ° °Procedures/Studies: °DG Chest 2 View ° °Result Date: 09/16/2021 °CLINICAL DATA:  Chest  pain EXAM: CHEST - 2 VIEW COMPARISON:  None. FINDINGS: Lungs are clear.  No pleural effusion or pneumothorax. The heart is normal in size. Degenerative changes of the visualized thoracolumbar spine. IMPRESSION: Normal chest radiographs. Electronically Signed   By: Sriyesh  Krishnan M.D.   On: 09/16/2021 03:19  ° °CT Angio Chest Pulmonary Embolism (PE) W or WO Contrast ° °Result Date: 09/16/2021 °CLINICAL DATA:  Central chest heaviness and shortness of breath. Currently on chemotherapy for multiple myeloma. EXAM: CT ANGIOGRAPHY CHEST WITH CONTRAST TECHNIQUE: Multidetector CT imaging of the chest was   performed using the standard protocol during bolus administration of intravenous contrast. Multiplanar CT image reconstructions and MIPs were obtained to evaluate the vascular anatomy. CONTRAST:  39m OMNIPAQUE IOHEXOL 350 MG/ML SOLN COMPARISON:  Chest x-ray from same day. FINDINGS: Cardiovascular: Acute nonocclusive right upper and lower lobar and segmental pulmonary emboli. RV/LV ratio measures 0.9. Borderline cardiomegaly. Trace pericardial effusion. No thoracic aortic aneurysm or dissection. Coronary, aortic arch, and branch vessel atherosclerotic vascular disease. Mediastinum/Nodes: No enlarged mediastinal, hilar, or axillary lymph nodes. Thyroid gland, trachea, and esophagus demonstrate no significant findings. Lungs/Pleura: Minimal subsegmental atelectasis at the lung bases. No focal consolidation, pleural effusion, or pneumothorax. Upper Abdomen: No acute abnormality. Postsurgical changes at the gastroesophageal junction with recurrent small hiatal hernia. Musculoskeletal: No chest wall abnormality. No acute or significant osseous findings. Review of the MIP images confirms the above findings. IMPRESSION: 1. Acute nonocclusive right upper and lower lobar and segmental pulmonary emboli. Positive for acute PE with CT evidence of right heart strain (RV/LV Ratio = 0.9.) consistent with at least submassive  (intermediate risk) PE. The presence of right heart strain has been associated with an increased risk of morbidity and mortality. 2. Aortic Atherosclerosis (ICD10-I70.0). Critical Value/emergent results were called by telephone at the time of interpretation on 09/16/2021 at 8:27 am to [facility] verbally acknowledged these results. Electronically Signed   By: WTitus DubinM.D.   On: 09/16/2021 08:34   ECHOCARDIOGRAM COMPLETE  Result Date: 09/17/2021    ECHOCARDIOGRAM REPORT   Patient Name:   WFelis Mcclure Date of Exam: 09/17/2021 Medical Rec #:  [id number]          Height:       67.0 in Accession #:    [id number]         Weight:       236.3 lb Date of Birth:  [birth year]          BSA:          2.171 m Patient Age:    77years            BP:           118/73 mmHg Patient Gender: M                   HR:           75 bpm. Exam Location:  Inpatient Procedure: 2D Echo, Color Doppler and Cardiac Doppler Indications:    I26.02 Pulmonary embolus  History:        Patient has prior history of Echocardiogram examinations, most                 recent 07/19/2019. Risk Factors:Hypertension and Dyslipidemia.                 [place] [place]    EPabloReferring Phys: [phone]: Very poor apical window due to lung interference IMPRESSIONS  1. Left ventricular ejection fraction, by estimation, is 55 to 60%. The left ventricle has normal function. Left ventricular endocardial border not optimally defined to evaluate regional wall motion, thought no wall motion abnormalities seen in the short axis series.There is mild concentric left ventricular hypertrophy. Left ventricular diastolic parameters are indeterminate.  2. Right ventricular systolic function is normal. The right ventricular size is normal. Tricuspid regurgitation signal is inadequate for assessing PA pressure.  3. Left atrial size was moderately dilated.  4.  Right atrial  size was mildly dilated.  5. A small pericardial effusion is present.  6. The mitral valve is grossly normal. No evidence of mitral valve regurgitation. No evidence of mitral stenosis.  7. The aortic valve was not well visualized. Aortic valve regurgitation is not visualized. Aortic valve sclerosis is present, with no evidence of aortic valve stenosis. Comparison(s): No prior Echocardiogram. FINDINGS  Left Ventricle: Left ventricular ejection fraction, by estimation, is 55 to 60%. The left ventricle has normal function. Left ventricular endocardial border not optimally defined to evaluate regional wall motion. The left ventricular internal cavity size was normal in size. There is mild concentric left ventricular hypertrophy. Left ventricular diastolic parameters are indeterminate. Right Ventricle: The right ventricular size is normal. No increase in right ventricular wall thickness. Right ventricular systolic function is normal. Tricuspid regurgitation signal is inadequate for assessing PA pressure. Left Atrium: Left atrial size was moderately dilated. Right Atrium: Right atrial size was mildly dilated. Pericardium: A small pericardial effusion is present. Mitral Valve: The mitral valve is grossly normal. No evidence of mitral valve regurgitation. No evidence of mitral valve stenosis. Tricuspid Valve: The tricuspid valve is grossly normal. Tricuspid valve regurgitation is not demonstrated. Aortic Valve: The aortic valve was not well visualized. Aortic valve regurgitation is not visualized. Aortic valve sclerosis is present, with no evidence of aortic valve stenosis. Pulmonic Valve: The pulmonic valve was not well visualized. Pulmonic valve regurgitation is not visualized. Aorta: The aortic root and ascending aorta are structurally normal, with no evidence of dilitation. IAS/Shunts: The atrial septum is grossly normal.  LEFT VENTRICLE PLAX 2D LVIDd:         4.00 cm   Diastology LVIDs:         2.50 cm   LV e' medial:     6.53 cm/s LV PW:         1.20 cm   LV E/e' medial:  11.3 LV IVS:        1.20 cm   LV e' lateral:   10.70 cm/s LVOT diam:     2.10 cm   LV E/e' lateral: 6.9 LV SV:         62 LV SV Index:   29 LVOT Area:     3.46 cm²  RIGHT VENTRICLE RV S prime:     10.60 cm/s TAPSE (M-mode): 2.1 cm LEFT ATRIUM             Index        RIGHT ATRIUM           Index LA diam:        4.30 cm 1.98 cm/m²   RA Area:     24.40 cm² LA Vol (A2C):   93.9 ml 43.25 ml/m²  RA Volume:   78.40 ml  36.11 ml/m² LA Vol (A4C):   90.7 ml 41.78 ml/m² LA Biplane Vol: 96.6 ml 44.50 ml/m²  AORTIC VALVE LVOT Vmax:   97.90 cm/s LVOT Vmean:  68.000 cm/s LVOT VTI:    0.179 m  AORTA Ao Root diam: 3.50 cm Ao Asc diam:  2.80 cm MITRAL VALVE MV Area (PHT): 3.30 cm²    SHUNTS MV Decel Time: 230 msec    Systemic VTI:  0.18 m MV E velocity: 73.70 cm/s  Systemic Diam: 2.10 cm MV A velocity: 56.10 cm/s MV E/A ratio:  1.31 Mahesh Chandrasekhar MD Electronically signed by Mahesh Chandrasekhar MD Signature Date/Time: 09/17/2021/12:56:54 PM    Final   ° °VAS US LOWER   EXTREMITY VENOUS (DVT) ° °Result Date: 09/16/2021 ° Lower Venous DVT Study Patient Name:  Jesse J Halbleib Jr.  Date of Exam:   09/16/2021 Medical Rec #: [id number]            Accession #:    [id number] Date of Birth: 01/06/1951            Patient Gender: M Patient Age:   70 years Exam Location:   Hospital Procedure:      VAS US LOWER EXTREMITY VENOUS (DVT) Referring Phys: TYRONE KYLE --------------------------------------------------------------------------------  Indications: Pulmonary embolism.  Risk Factors: Surgery Left TKR - 07/15/2021. Comparison Study: No previous exams Performing Technologist: Jody Hill RVT, RDMS  Examination Guidelines: A complete evaluation includes B-mode imaging, spectral Doppler, color Doppler, and power Doppler as needed of all accessible portions of each vessel. Bilateral testing is considered an integral part of a complete examination. Limited examinations for  reoccurring indications may be performed as noted. The reflux portion of the exam is performed with the patient in reverse Trendelenburg.  +---------+---------------+---------+-----------+----------+--------------+  RIGHT     Compressibility Phasicity Spontaneity Properties Thrombus Aging  +---------+---------------+---------+-----------+----------+--------------+  CFV       Full            Yes       Yes                                    +---------+---------------+---------+-----------+----------+--------------+  SFJ       Full                                                             +---------+---------------+---------+-----------+----------+--------------+  FV Prox   Full            Yes       Yes                                    +---------+---------------+---------+-----------+----------+--------------+  FV Mid    Full            Yes       Yes                                    +---------+---------------+---------+-----------+----------+--------------+  FV Distal Full            Yes       Yes                                    +---------+---------------+---------+-----------+----------+--------------+  PFV       Full                                                             +---------+---------------+---------+-----------+----------+--------------+  POP       Full              Yes       Yes                                    +---------+---------------+---------+-----------+----------+--------------+  PTV       Full                                                             +---------+---------------+---------+-----------+----------+--------------+  PERO      Full                                                             +---------+---------------+---------+-----------+----------+--------------+   +---------+---------------+---------+-----------+----------+--------------+  LEFT      Compressibility Phasicity Spontaneity Properties Thrombus Aging   +---------+---------------+---------+-----------+----------+--------------+  CFV       Full            Yes       Yes                                    +---------+---------------+---------+-----------+----------+--------------+  SFJ       Full                                                             +---------+---------------+---------+-----------+----------+--------------+  FV Prox   Full            Yes       Yes                                    +---------+---------------+---------+-----------+----------+--------------+  FV Mid    Full            Yes       Yes                                    +---------+---------------+---------+-----------+----------+--------------+  FV Distal Full            Yes       Yes                                    +---------+---------------+---------+-----------+----------+--------------+  PFV       Full                                                             +---------+---------------+---------+-----------+----------+--------------+  POP       Full  Yes       Yes                                    +---------+---------------+---------+-----------+----------+--------------+  PTV       Full                                                             +---------+---------------+---------+-----------+----------+--------------+  PERO      None            No        No                     Acute           +---------+---------------+---------+-----------+----------+--------------+     Summary: BILATERAL: - No evidence of superficial venous thrombosis in the lower extremities, bilaterally. -No evidence of popliteal cyst, bilaterally. RIGHT: - There is no evidence of deep vein thrombosis in the lower extremity.  LEFT: - Findings consistent with acute deep vein thrombosis involving the left peroneal veins.  *See table(s) above for measurements and observations. Electronically signed by Deitra Mayo MD on 09/16/2021 at 1:54:45 PM.    Final      Subjective: Seen and  examined at bedside patient was doing much better and felt back to his baseline.  Denying chest pain or shortness of breath.  Ambulated and did not desaturate.  Denies any nausea or vomiting and ready to go home.  Discharge Exam: Vitals:   09/18/21 1100 09/18/21 1200  BP: (!) 136/52 (!) 128/98  Pulse: 73 69  Resp: 16 (!) 21  Temp:    SpO2: 93% 95%   Vitals:   09/18/21 0900 09/18/21 1000 09/18/21 1100 09/18/21 1200  BP: (!) 131/96 134/61 (!) 136/52 (!) 128/98  Pulse: 70 67 73 69  Resp: (!) _0 (!) 21  Temp:      TempSrc:      SpO2: 95% 97% 93% 95%  Weight:      Height:       General: Pt is alert, awake, not in acute distress Cardiovascular: RRR, S1/S2 +, no rubs, no gallops Respiratory: Diminished bilaterally, no wheezing, no rhonchi; unlabored breathing and not wearing any supplemental oxygen nasal Abdominal: Soft, NT, distended secondary body habitus, bowel sounds + Extremities: Slight edema on the left, no cyanosis  The results of significant diagnostics from this hospitalization (including imaging, microbiology, ancillary and laboratory) are listed below for reference.    Microbiology: Recent Results (from the past 240 hour(s))  Resp Panel by RT-PCR (Flu A&B, Covid) Nasopharyngeal Swab     Status: None   Collection Time: 09/16/21  9:25 AM   Specimen: Nasopharyngeal Swab; Nasopharyngeal(NP) swabs in vial transport medium  Result Value Ref Range Status   SARS Coronavirus 2 by RT PCR NEGATIVE NEGATIVE Final    Comment: (NOTE) SARS-CoV-2 target nucleic acids are NOT DETECTED.  The SARS-CoV-2 RNA is generally detectable in upper respiratory specimens during the acute phase of infection. The lowest concentration of SARS-CoV-2 viral copies this assay can detect is 138 copies/mL. A negative result does not preclude SARS-Cov-2 infection and should not be used as the sole basis for treatment or other patient management decisions.  A negative result may occur with  °improper  specimen collection/handling, submission of specimen other °than nasopharyngeal swab, presence of viral mutation(s) within the °areas targeted by this assay, and inadequate number of viral °copies(<138 copies/mL). A negative result must be combined with °clinical observations, patient history, and epidemiological °information. The expected result is Negative. ° °Fact Sheet for Patients:  °https://www.fda.gov/media/152166/download ° °Fact Sheet for Healthcare Providers:  °https://www.fda.gov/media/152162/download ° °This test is no t yet approved or cleared by the [place] FDA and  °has been authorized for detection and/or diagnosis of SARS-CoV-2 by °FDA under an Emergency Use Authorization (EUA). This EUA will remain  °in effect (meaning this test can be used) for the duration of the °COVID-19 declaration under Section 564(b)(1) of the Act, 21 °U.S.C.section 360bbb-3(b)(1), unless the authorization is terminated  °or revoked sooner.  ° ° °  ° Influenza A by PCR NEGATIVE NEGATIVE Final  ° Influenza B by PCR NEGATIVE NEGATIVE Final  °  Comment: (NOTE) °The Xpert Xpress SARS-CoV-2/FLU/RSV plus assay is intended as an aid °in the diagnosis of influenza from Nasopharyngeal swab specimens and °should not be used as a sole basis for treatment. Nasal washings and °aspirates are unacceptable for Xpert Xpress SARS-CoV-2/FLU/RSV °testing. ° °Fact Sheet for Patients: °https://www.fda.gov/media/152166/download ° °Fact Sheet for Healthcare Providers: °https://www.fda.gov/media/152162/download ° °This test is not yet approved or cleared by the [place] FDA and °has been authorized for detection and/or diagnosis of SARS-CoV-2 by °FDA under an Emergency Use Authorization (EUA). This EUA will remain °in effect (meaning this test can be used) for the duration of the °COVID-19 declaration under Section 564(b)(1) of the Act, 21 U.S.C. °section 360bbb-3(b)(1), unless the authorization is terminated or °revoked. ° °Performed at  [facility], [address] °[place], [place] [address] °  °MRSA Next Gen by PCR, Nasal     Status: None  ° Collection Time: 09/16/21  7:01 PM  ° Specimen: Nasal Mucosa; Nasal Swab  °Result Value Ref Range Status  ° MRSA by PCR Next Gen NOT DETECTED NOT DETECTED Final  °  Comment: (NOTE) °The GeneXpert MRSA Assay (FDA approved for NASAL specimens only), °is one component of a comprehensive MRSA colonization surveillance °program. It is not intended to diagnose MRSA infection nor to guide °or monitor treatment for MRSA infections. °Test performance is not FDA approved in patients less than 2 years °old. °Performed at [facility], [address] °[place], [place] [address] °  °  °Labs: °BNP (last 3 results) °Recent Labs  °  09/16/21 °1000  °BNP 270.8*  ° °Basic Metabolic Panel: °Recent Labs  °Lab 09/15/21 °1007 09/16/21 °0306 09/17/21 °0303 09/18/21 °0242  °NA 139 136 133* 134*  °K 3.6 3.5 2.9* 3.5  °CL 105 102 100 100  °CO2 25 24 26 24  °GLUCOSE 117* 150* 119* 117*  °BUN 16 26* 21 16  °CREATININE 0.78 0.92 0.87 0.78  °CALCIUM 8.4* 8.6* 7.2* 7.5*  °MG  --   --  1.8 2.5*  °PHOS  --   --  2.9 2.5  ° °Liver Function Tests: °Recent Labs  °Lab 09/15/21 °1007 09/17/21 °0303 09/18/21 °0242  °AST 20 36 42*  °ALT 27 32 44  °ALKPHOS 65 46 51  °BILITOT 1.1 1.6* 1.2  °PROT 6.4* 6.1* 6.4*  °ALBUMIN 3.5 3.3* 3.4*  ° °No results for input(s): LIPASE, AMYLASE in the last 168 hours. °No results for input(s): AMMONIA in the last 168 hours. °CBC: °Recent Labs  °Lab 09/15/21 °  1007 09/16/21 °0306 09/17/21 °0303 09/18/21 °0242  °WBC 3.7* 4.6 2.6* 3.0*  °NEUTROABS 1.8  --   --  1.3*  °HGB 13.0 13.9 11.5* 12.7*  °HCT 37.8* 41.1 33.2* 37.5*  °MCV 107.4* 110.2* 108.1* 110.6*  °PLT 128* 118* 93* 102*  ° °Cardiac Enzymes: °No results for input(s): CKTOTAL, CKMB, CKMBINDEX, TROPONINI in the last 168 hours. °BNP: °Invalid input(s): POCBNP °CBG: °No results for input(s): GLUCAP in the last 168  hours. °D-Dimer °No results for input(s): DDIMER in the last 72 hours. °Hgb A1c °No results for input(s): HGBA1C in the last 72 hours. ° °Lipid Profile °No results for input(s): CHOL, HDL, LDLCALC, TRIG, CHOLHDL, LDLDIRECT in the last 72 hours. °Thyroid function studies °No results for input(s): TSH, T4TOTAL, T3FREE, THYROIDAB in the last 72 hours. ° °Invalid input(s): FREET3 °Anemia work up °No results for input(s): VITAMINB12, FOLATE, FERRITIN, TIBC, IRON, RETICCTPCT in the last 72 hours. °Urinalysis °   °Component Value Date/Time  ° BILIRUBINUR n 05/20/2016 1101  ° PROTEINUR n 05/20/2016 1101  ° UROBILINOGEN negative 05/20/2016 1101  ° NITRITE n 05/20/2016 1101  ° LEUKOCYTESUR Negative 05/20/2016 1101  ° °Sepsis Labs °Invalid input(s): PROCALCITONIN,  WBC,  LACTICIDVEN °Microbiology °Recent Results (from the past 240 hour(s))  °Resp Panel by RT-PCR (Flu A&B, Covid) Nasopharyngeal Swab     Status: None  ° Collection Time: 09/16/21  9:25 AM  ° Specimen: Nasopharyngeal Swab; Nasopharyngeal(NP) swabs in vial transport medium  °Result Value Ref Range Status  ° SARS Coronavirus 2 by RT PCR NEGATIVE NEGATIVE Final  °  Comment: (NOTE) °SARS-CoV-2 target nucleic acids are NOT DETECTED. ° °The SARS-CoV-2 RNA is generally detectable in upper respiratory °specimens during the acute phase of infection. The lowest °concentration of SARS-CoV-2 viral copies this assay can detect is °138 copies/mL. A negative result does not preclude SARS-Cov-2 °infection and should not be used as the sole basis for treatment or °other patient management decisions. A negative result may occur with  °improper specimen collection/handling, submission of specimen other °than nasopharyngeal swab, presence of viral mutation(s) within the °areas targeted by this assay, and inadequate number of viral °copies(<138 copies/mL). A negative result must be combined with °clinical observations, patient history, and epidemiological °information. The expected  result is Negative. ° °Fact Sheet for Patients:  °https://www.fda.gov/media/152166/download ° °Fact Sheet for Healthcare Providers:  °https://www.fda.gov/media/152162/download ° °This test is no t yet approved or cleared by the [place] FDA and  °has been authorized for detection and/or diagnosis of SARS-CoV-2 by °FDA under an Emergency Use Authorization (EUA). This EUA will remain  °in effect (meaning this test can be used) for the duration of the °COVID-19 declaration under Section 564(b)(1) of the Act, 21 °U.S.C.section 360bbb-3(b)(1), unless the authorization is terminated  °or revoked sooner.  ° ° °  ° Influenza A by PCR NEGATIVE NEGATIVE Final  ° Influenza B by PCR NEGATIVE NEGATIVE Final  °  Comment: (NOTE) °The Xpert Xpress SARS-CoV-2/FLU/RSV plus assay is intended as an aid °in the diagnosis of influenza from Nasopharyngeal swab specimens and °should not be used as a sole basis for treatment. Nasal washings and °aspirates are unacceptable for Xpert Xpress SARS-CoV-2/FLU/RSV °testing. ° °Fact Sheet for Patients: °https://www.fda.gov/media/152166/download ° °Fact Sheet for Healthcare Providers: °https://www.fda.gov/media/152162/download ° °This test is not yet approved or cleared by the [place] FDA and °has been authorized for detection and/or diagnosis of SARS-CoV-2 by °FDA under an Emergency Use Authorization (EUA). This EUA will remain °in effect (meaning this test can   be used) for the duration of the °COVID-19 declaration under Section 564(b)(1) of the Act, 21 U.S.C. °section 360bbb-3(b)(1), unless the authorization is terminated or °revoked. ° °Performed at [facility], [address] °[place], [place] [address] °  °MRSA Next Gen by PCR, Nasal     Status: None  ° Collection Time: 09/16/21  7:01 PM  ° Specimen: Nasal Mucosa; Nasal Swab  °Result Value Ref Range Status  ° MRSA by PCR Next Gen NOT DETECTED NOT DETECTED Final  °  Comment: (NOTE) °The GeneXpert MRSA Assay (FDA  approved for NASAL specimens only), °is one component of a comprehensive MRSA colonization surveillance °program. It is not intended to diagnose MRSA infection nor to guide °or monitor treatment for MRSA infections. °Test performance is not FDA approved in patients less than 2 years °old. °Performed at [facility], [address] °[place], [place] [address] °  ° °Time coordinating discharge: 35 minutes ° °SIGNED: ° °Omair Latif Sheikh, DO °[facility] °09/19/2021, 5:03 PM °Pager is on AMION ° °If 7PM-7AM, please contact night-coverage °[email or web address] ° ° °

## 2021-09-19 ENCOUNTER — Telehealth: Payer: Self-pay

## 2021-09-19 NOTE — Telephone Encounter (Signed)
Transition Care Management Unsuccessful Follow-up Telephone Call  Date of discharge and from where:  09/18/2021 [facility]  Attempts:  1st Attempt  Reason for unsuccessful TCM follow-up call:  Left voice message

## 2021-09-19 NOTE — Telephone Encounter (Signed)
He called back and received message. He appreciated the call. He is feeling really good and was glad to sleep in his own bed.  Angela Nevin Christmas and he will see you in January.

## 2021-09-19 NOTE — Telephone Encounter (Signed)
-----   Message from Heath Lark, MD sent at 09/19/2021  7:57 AM EST ----- Regarding: dc for hospital Can you call and ask how he is doing? Myeloma panel came back ok

## 2021-09-19 NOTE — Telephone Encounter (Signed)
Called and left a message asking him to call the office back. 

## 2021-09-22 ENCOUNTER — Ambulatory Visit (INDEPENDENT_AMBULATORY_CARE_PROVIDER_SITE_OTHER): Payer: Medicare Other | Admitting: Family Medicine

## 2021-09-22 ENCOUNTER — Telehealth: Payer: Self-pay

## 2021-09-22 ENCOUNTER — Other Ambulatory Visit: Payer: Self-pay

## 2021-09-22 ENCOUNTER — Encounter: Payer: Self-pay | Admitting: Family Medicine

## 2021-09-22 VITALS — BP 110/76 | HR 97 | Temp 98.7°F | Wt 235.4 lb

## 2021-09-22 DIAGNOSIS — E785 Hyperlipidemia, unspecified: Secondary | ICD-10-CM

## 2021-09-22 DIAGNOSIS — I2699 Other pulmonary embolism without acute cor pulmonale: Secondary | ICD-10-CM | POA: Diagnosis not present

## 2021-09-22 DIAGNOSIS — M1712 Unilateral primary osteoarthritis, left knee: Secondary | ICD-10-CM | POA: Diagnosis not present

## 2021-09-22 MED ORDER — RIVAROXABAN 20 MG PO TABS: 20.0000 mg | ORAL_TABLET | Freq: Every day | ORAL | 1 refills | Status: DC

## 2021-09-22 MED ORDER — ATORVASTATIN CALCIUM 20 MG PO TABS: 20.0000 mg | ORAL_TABLET | Freq: Every day | ORAL | 4 refills | Status: DC

## 2021-09-22 NOTE — Progress Notes (Signed)
° °  Subjective:    Patient ID: Jesse Guest., adult    DOB: [birth year], 70 y.o.   MRN: [id number]  HPI He is here for a follow-up visit after recent hospitalization and treatment for pulmonary embolus.  He did have a left TKR done in October and does have a history of MM and is followed by oncology for this.  Presently he is having no chest pain, shortness of breath, leg pain or swelling.  He also notes that this left calf is actually diminished in size.  He is taking Xarelto and on a starter pack. The hospital record including discharge summary blood work and x-rays was reviewed.  His medications were reviewed as well. He is now 70 and due to age and being male, he does now qualify for being on a statin drug. Review of Systems     Objective:   Physical Exam Alert and in no distress.  Negative Homans' sign.  Cardiac exam shows regular rhythm without murmurs gallops.  Lungs clear to auscultation.       Assessment & Plan:  Acute pulmonary embolism, unspecified pulmonary embolism type, unspecified whether acute cor pulmonale present ([place]) - Plan: rivaroxaban (XARELTO) 20 MG TABS tablet, CBC with Differential/Platelet, Comprehensive metabolic panel, Magnesium  Hyperlipidemia, unspecified hyperlipidemia type - Plan: atorvastatin (LIPITOR) 20 MG tablet I explained the risk of cardiovascular disease and the need for being on a statin drug.  Discussed treatment and possible side effects. I then discussed the treatment of the pulmonary embolus.  I will keep him on Xarelto for 6 months.  Discussed treatment of various issues in regard to Xarelto specifically bleeding and help take care of this.  He expressed understanding of this.

## 2021-09-22 NOTE — Telephone Encounter (Signed)
Transition Care Management Follow-up Telephone Call Date of discharge and from where[facility] How have you been since you were released from the hospital? NO Any questions or concerns? No  Items Reviewed: Did the pt receive and understand the discharge instructions provided? Yes  Medications obtained and verified? Yes  Other? No  Any new allergies since your discharge? No  Dietary orders reviewed? No Do you have support at home? Yes   Home Care and Equipment/Supplies: Were home health services ordered? no   Follow up appointments reviewed:  PCP Hospital f/u appt confirmed? Yes  Scheduled to see Dr. Redmond School on 09/22/21 @ 2;30. [facility] f/u appt confirmed? No   Are transportation arrangements needed? No  If their condition worsens, is the pt aware to call PCP or go to the Emergency Dept.? Yes Was the patient provided with contact information for the PCP's office or ED? Yes Was to pt encouraged to call back with questions or concerns? Yes

## 2021-10-03 ENCOUNTER — Telehealth: Payer: Self-pay | Admitting: Hematology and Oncology

## 2021-10-03 NOTE — Telephone Encounter (Signed)
Called and confirmed upcoming appointment. Patient is aware.

## 2021-10-14 ENCOUNTER — Ambulatory Visit: Payer: Medicare Other | Admitting: Hematology and Oncology

## 2021-10-14 ENCOUNTER — Inpatient Hospital Stay (HOSPITAL_BASED_OUTPATIENT_CLINIC_OR_DEPARTMENT_OTHER): Payer: Medicare Other | Admitting: Hematology and Oncology

## 2021-10-14 ENCOUNTER — Inpatient Hospital Stay: Payer: Medicare Other | Attending: Hematology and Oncology

## 2021-10-14 ENCOUNTER — Encounter: Payer: Self-pay | Admitting: Hematology and Oncology

## 2021-10-14 ENCOUNTER — Other Ambulatory Visit: Payer: Self-pay

## 2021-10-14 ENCOUNTER — Other Ambulatory Visit: Payer: Medicare Other

## 2021-10-14 DIAGNOSIS — M7918 Myalgia, other site: Secondary | ICD-10-CM | POA: Insufficient documentation

## 2021-10-14 DIAGNOSIS — I2699 Other pulmonary embolism without acute cor pulmonale: Secondary | ICD-10-CM | POA: Diagnosis not present

## 2021-10-14 DIAGNOSIS — G8929 Other chronic pain: Secondary | ICD-10-CM

## 2021-10-14 DIAGNOSIS — C9000 Multiple myeloma not having achieved remission: Secondary | ICD-10-CM

## 2021-10-14 DIAGNOSIS — Z86718 Personal history of other venous thrombosis and embolism: Secondary | ICD-10-CM | POA: Insufficient documentation

## 2021-10-14 LAB — CBC WITH DIFFERENTIAL/PLATELET
Abs Immature Granulocytes: 0.01 10*3/uL (ref 0.00–0.07)
Basophils Absolute: 0 10*3/uL (ref 0.0–0.1)
Basophils Relative: 0 %
Eosinophils Absolute: 0.2 10*3/uL (ref 0.0–0.5)
Eosinophils Relative: 5 %
HCT: 40.1 % (ref 39.0–52.0)
Hemoglobin: 13.6 g/dL (ref 13.0–17.0)
Immature Granulocytes: 0 %
Lymphocytes Relative: 37 %
Lymphs Abs: 1.7 10*3/uL (ref 0.7–4.0)
MCH: 36.5 pg — ABNORMAL HIGH (ref 26.0–34.0)
MCHC: 33.9 g/dL (ref 30.0–36.0)
MCV: 107.5 fL — ABNORMAL HIGH (ref 80.0–100.0)
Monocytes Absolute: 0.8 10*3/uL (ref 0.1–1.0)
Monocytes Relative: 17 %
Neutro Abs: 1.8 10*3/uL (ref 1.7–7.7)
Neutrophils Relative %: 41 %
Platelets: 169 10*3/uL (ref 150–400)
RBC: 3.73 MIL/uL — ABNORMAL LOW (ref 4.22–5.81)
RDW: 13.4 % (ref 11.5–15.5)
WBC: 4.5 10*3/uL (ref 4.0–10.5)
nRBC: 0 % (ref 0.0–0.2)

## 2021-10-14 LAB — COMPREHENSIVE METABOLIC PANEL
ALT: 24 U/L (ref 0–44)
AST: 20 U/L (ref 15–41)
Albumin: 3.9 g/dL (ref 3.5–5.0)
Alkaline Phosphatase: 47 U/L (ref 38–126)
Anion gap: 5 (ref 5–15)
BUN: 18 mg/dL (ref 8–23)
CO2: 27 mmol/L (ref 22–32)
Calcium: 9.1 mg/dL (ref 8.9–10.3)
Chloride: 105 mmol/L (ref 98–111)
Creatinine, Ser: 0.77 mg/dL (ref 0.61–1.24)
GFR, Estimated: >60 mL/min (ref >60)
Glucose, Bld: 81 mg/dL (ref 70–99)
Potassium: 4.2 mmol/L (ref 3.5–5.1)
Sodium: 137 mmol/L (ref 135–145)
Total Bilirubin: 0.8 mg/dL (ref 0.3–1.2)
Total Protein: 6.6 g/dL (ref 6.5–8.1)

## 2021-10-14 MED ORDER — OXYCODONE HCL 5 MG PO TABS: 5.0000 mg | ORAL_TABLET | ORAL | 0 refills | Status: DC | PRN

## 2021-10-14 NOTE — Progress Notes (Signed)
[place] OFFICE PROGRESS NOTE  Patient Care Team: Denita Lung, MD as PCP - General (Family Medicine) Heath Lark, MD as Consulting Physician (Hematology and Oncology)  ASSESSMENT & PLAN:  Multiple myeloma without remission [facility]) His last myeloma panel a month ago show stable disease control Due to recent provoked PE, lenalidomide was discontinued We will redraw another sample of myeloma panel today and I will call him with test results Assuming he has no evidence of disease progression, I think it is reasonable to wait until he returns back from his vacation in mid February to resume treatment We discussed the risk, benefits, side effects of combination treatment with daratumumab, Velcade and dexamethasone and he appears interested I will create treatment plan once his myeloma panel is available next week  Chronic musculoskeletal pain He has chronic musculoskeletal pain His previous imaging study did not show any evidence of fracture He is on calcium and vitamin D supplement and had received Zometa recently I recommend we continue pain medicine in the short-term I plan to repeat imaging study next month for further follow-up before his start new treatment  Acute pulmonary embolism without acute cor pulmonale ([place]) He is doing well on chronic anticoagulation therapy without bleeding complications The PE was provoked by recent surgery as well as side effects of lenalidomide We discussed the risk of blood clots during travels He needs to be on anticoagulation therapy for 1 year, due to be completed by mid December 2023  No orders of the defined types were placed in this encounter.   All questions were answered. The patient knows to call the clinic with any problems, questions or concerns. The total time spent in the appointment was 30 minutes encounter with patients including review of chart and various tests results, discussions about plan of care and coordination  of care plan   Heath Lark, MD 10/14/2021 3:23 PM  INTERVAL HISTORY: Please see below for problem oriented charting. he returns for surveillance follow-up for multiple myeloma He complain of intermittent back pain He denies side effects of anticoagulations therapy such as bleeding Denies further chest pain or shortness of breath The patient plan to go to the [place] for his 30 years anniversary in mid February  REVIEW OF SYSTEMS:   Constitutional: Denies fevers, chills or abnormal weight loss Eyes: Denies blurriness of vision Ears, nose, mouth, throat, and face: Denies mucositis or sore throat Respiratory: Denies cough, dyspnea or wheezes Cardiovascular: Denies palpitation, chest discomfort or lower extremity swelling Gastrointestinal:  Denies nausea, heartburn or change in bowel habits Skin: Denies abnormal skin rashes Lymphatics: Denies new lymphadenopathy or easy bruising Neurological:Denies numbness, tingling or new weaknesses Behavioral/Psych: Mood is stable, no new changes  All other systems were reviewed with the patient and are negative.  I have reviewed the past medical history, past surgical history, social history and family history with the patient and they are unchanged from previous note.  ALLERGIES:  has No Known Allergies.  MEDICATIONS:  Current Outpatient Medications  Medication Sig Dispense Refill   acetaminophen (TYLENOL) 650 MG CR tablet Take 650 mg by mouth every 8 (eight) hours as needed for pain.     acyclovir (ZOVIRAX) 400 MG tablet Take 1 tablet (400 mg total) by mouth 2 (two) times daily. (Patient taking differently: Take 400 mg by mouth daily.) 180 tablet 11   atorvastatin (LIPITOR) 20 MG tablet Take 1 tablet (20 mg total) by mouth daily. 90 tablet 4   calcium carbonate (TUMS - DOSED  IN MG ELEMENTAL CALCIUM) 500 MG chewable tablet Chew 1-2 tablets by mouth daily as needed for heartburn.     Cholecalciferol (VITAMIN D) 50 MCG (2000 UT) tablet Take 2,000  Units by mouth daily.     folic acid (FOLVITE) 322 MCG tablet Take 400 mcg by mouth daily.     losartan-hydrochlorothiazide (HYZAAR) 100-12.5 MG tablet Take 1 tablet by mouth daily. 90 tablet 3   Multiple Vitamin (MULTI-VITAMIN) tablet Take 1 tablet by mouth daily.     ondansetron (ZOFRAN) 4 MG tablet Take 1 tablet (4 mg total) by mouth every 6 (six) hours as needed for nausea. 20 tablet 0   oxyCODONE (ROXICODONE) 5 MG immediate release tablet Take 1 tablet (5 mg total) by mouth every 4 (four) hours as needed for severe pain. 60 tablet 0   Polyethyl Glycol-Propyl Glycol (SYSTANE OP) Place 1 drop into both eyes daily as needed (dry eyes).     rivaroxaban (XARELTO) 20 MG TABS tablet Take 1 tablet (20 mg total) by mouth daily with supper. 90 tablet 1   vitamin B-12 (CYANOCOBALAMIN) 1000 MCG tablet Take 1,000 mcg by mouth daily.     No current facility-administered medications for this visit.    SUMMARY OF ONCOLOGIC HISTORY: Oncology History  Multiple myeloma without remission ([place])  03/20/2019 Imaging   1. Tumor involving the L5 and S1 and S2 segments of the spine as described with mass effects upon the left L4, L5, S1 and more distal left sacral nerves as described above. 2. Does the patient have a history of malignancy? 3. Multiple plasmacytomas and metastatic disease could give this appearance   03/27/2019 Pathology Results   Soft Tissue Needle Core Biopsy, sacral mass - PLASMABLASTIC NEOPLASM. - SEE COMMENT. Microscopic Comment The sections show needle core biopsy fragments of soft tissue densely infiltrated by a relatively monomorphic infiltrate of atypical plasmacytoid cells characterized by vesicular or partially clumped chromatin and prominent nucleoli. This is associated with scattered mitosis. A battery of immunohistochemical stains was performed and show that the atypical plasmacytoid cells are positive for CD138, CD43 and cytoplasmic kappa. There is also weak positivity for CD56 and  partial variable positivity for cyclin D1. The atypical plasmacytoid cells are negative for cytoplasmic lambda, CD10, PAX5, CD79a, CD20, CD3, CD5, CD34, EBV (ISH) and mostly negative for LCA. The overall morphologic and histologic features are most compatible with a plasmablastic neoplasm. The differential diagnosis includes plasmablastic lymphoma and plasmablastic plasmacytoma/myeloma. Based on the overall phenotypic features and the clinical setting, plasmacytoma/myeloma is favored. Clinical correlation and hematologic evaluation is recommended   03/27/2019 Procedure   Technically successful CT-guided biopsy of sacral soft tissue mass.   04/04/2019 Initial Diagnosis   Multiple myeloma without remission ([place])   04/11/2019 PET scan   IMPRESSION: 1. Previously noted expansile lesions involving the L5 vertebra and sacrum are again noted and exhibit intense FDG uptake compatible with metabolically active disease. A third focus of increased uptake without corresponding CT abnormality is noted within the intertrochanteric portions of the proximal right femur. 2. Small lucent lesion within the T3 vertebra is noted without corresponding FDG uptake. 3. Asymmetric left bladder wall thickening, etiology indeterminate. 4. Aortic Atherosclerosis (ICD10-I70.0). Lad coronary artery calcification.   04/12/2019 Cancer Staging   Staging form: Plasma Cell Myeloma and Plasma Cell Disorders, AJCC 8th Edition - Clinical stage from 04/12/2019: Beta-2-microglobulin (mg/L): 2, Albumin (g/dL): 3.6, ISS: Stage I, High-risk cytogenetics: Unknown, LDH: Unknown - Signed by Heath Lark, MD on 04/12/2019    04/14/2019  Bone Marrow Biopsy   Bone Marrow, Aspirate,Biopsy, and Clot, right iliac bone - PLASMA CELL MYELOMA.   04/17/2019 - 07/11/2019 Chemotherapy   The patient had bortezemib, revlimid and dexamethasone for chemotherapy treatment.     08/17/2019 Bone Marrow Transplant   He received high dose melphalan followed by  autologous stem cell transplant at [facility]   11/22/2019 Bone Marrow Biopsy   BONE MARROW biopsy at [facility]:       Mild monoclonal plasmacytosis (approximately 2%), kappa light chain restricted.    11/22/2019 PET scan   PET CT at [facility] 1.  Similar size and appearance of mildly hypermetabolic lytic lesion in the L5 vertebral body and posterior elements.  2.  Otherwise, no substantial FDG uptake compared to background bone marrow in the additional osseous lucent lesions.    12/20/2019 - 05/09/2020 Radiation Therapy   Radiation Treatment Dates: 12/20/2019 through 01/08/2020 Site Technique Total Dose (Gy) Dose per Fx (Gy) Completed Fx Beam Energies  Lumbar Spine: Spine 3D 35/35 2.5 14/14 15X        02/29/2020 PET scan   1. Similar size and appearance of expansile lytic lesion in the L5 vertebral body and posterior elements with minimal FDG activity.  2. Otherwise, no additional osseous lucent lesions with FDG uptake above background bone marrow   05/31/2020 -  Chemotherapy   The patient had Revlimid for chemotherapy treatment.       PHYSICAL EXAMINATION: ECOG PERFORMANCE STATUS: 1 - Symptomatic but completely ambulatory  Vitals:   10/14/21 1115  BP: (!) 135/98  Pulse: 83  Resp: 18  Temp: 98.2 F (36.8 C)  SpO2: 96%   Filed Weights   10/14/21 1115  Weight: 233 lb (105.7 kg)    GENERAL:alert, no distress and comfortable NEURO: alert & oriented x 3 with fluent speech, no focal motor/sensory deficits  LABORATORY DATA:  I have reviewed the data as listed    Component Value Date/Time   NA 137 10/14/2021 1052   NA 139 10/11/2018 1115   NA 139 04/05/2014 0849   K 4.2 10/14/2021 1052   K 4.4 04/05/2014 0849   CL 105 10/14/2021 1052   CO2 27 10/14/2021 1052   CO2 23 04/05/2014 0849   GLUCOSE 81 10/14/2021 1052   GLUCOSE 109 04/05/2014 0849   BUN 18 10/14/2021 1052   BUN 23 10/11/2018 1115   BUN 16.8 04/05/2014 0849   CREATININE 0.77 10/14/2021 1052   CREATININE  0.75 03/31/2021 1010   CREATININE 0.91 07/29/2017 1004   CREATININE 0.9 04/05/2014 0849   CALCIUM 9.1 10/14/2021 1052   CALCIUM 8.7 04/05/2014 0849   PROT 6.6 10/14/2021 1052   PROT 7.2 10/11/2018 1115   PROT 6.7 04/05/2014 0849   ALBUMIN 3.9 10/14/2021 1052   ALBUMIN 4.7 10/11/2018 1115   ALBUMIN 3.8 04/05/2014 0849   AST 20 10/14/2021 1052   AST 25 03/31/2021 1010   AST 26 04/05/2014 0849   ALT 24 10/14/2021 1052   ALT 33 03/31/2021 1010   ALT 30 04/05/2014 0849   ALKPHOS 47 10/14/2021 1052   ALKPHOS 75 04/05/2014 0849   BILITOT 0.8 10/14/2021 1052   BILITOT 0.9 03/31/2021 1010   BILITOT 0.71 04/05/2014 0849   GFRNONAA >60 10/14/2021 1052   GFRNONAA >60 03/31/2021 1010   GFRAA >60 06/14/2020 0830    No results found for: SPEP, UPEP  Lab Results  Component Value Date   WBC 4.5 10/14/2021   NEUTROABS 1.8 10/14/2021   HGB 13.6  10/14/2021   HCT 40.1 10/14/2021   MCV 107.5 (H) 10/14/2021   PLT 169 10/14/2021      Chemistry      Component Value Date/Time   NA 137 10/14/2021 1052   NA 139 10/11/2018 1115   NA 139 04/05/2014 0849   K 4.2 10/14/2021 1052   K 4.4 04/05/2014 0849   CL 105 10/14/2021 1052   CO2 27 10/14/2021 1052   CO2 23 04/05/2014 0849   BUN 18 10/14/2021 1052   BUN 23 10/11/2018 1115   BUN 16.8 04/05/2014 0849   CREATININE 0.77 10/14/2021 1052   CREATININE 0.75 03/31/2021 1010   CREATININE 0.91 07/29/2017 1004   CREATININE 0.9 04/05/2014 0849   GLU 10 04/10/2014 0000      Component Value Date/Time   CALCIUM 9.1 10/14/2021 1052   CALCIUM 8.7 04/05/2014 0849   ALKPHOS 47 10/14/2021 1052   ALKPHOS 75 04/05/2014 0849   AST 20 10/14/2021 1052   AST 25 03/31/2021 1010   AST 26 04/05/2014 0849   ALT 24 10/14/2021 1052   ALT 33 03/31/2021 1010   ALT 30 04/05/2014 0849   BILITOT 0.8 10/14/2021 1052   BILITOT 0.9 03/31/2021 1010   BILITOT 0.71 04/05/2014 0849       RADIOGRAPHIC STUDIES: I have personally reviewed the radiological images as  listed and agreed with the findings in the report. DG Chest 2 View  Result Date: 09/16/2021 CLINICAL DATA:  Chest pain EXAM: CHEST - 2 VIEW COMPARISON:  None. FINDINGS: Lungs are clear.  No pleural effusion or pneumothorax. The heart is normal in size. Degenerative changes of the visualized thoracolumbar spine. IMPRESSION: Normal chest radiographs. Electronically Signed   By: Charline Bills M.D.   On: 09/16/2021 03:19   CT Angio Chest Pulmonary Embolism (PE) W or WO Contrast  Result Date: 09/16/2021 CLINICAL DATA:  Central chest heaviness and shortness of breath. Currently on chemotherapy for multiple myeloma. EXAM: CT ANGIOGRAPHY CHEST WITH CONTRAST TECHNIQUE: Multidetector CT imaging of the chest was performed using the standard protocol during bolus administration of intravenous contrast. Multiplanar CT image reconstructions and MIPs were obtained to evaluate the vascular anatomy. CONTRAST:  48mL OMNIPAQUE IOHEXOL 350 MG/ML SOLN COMPARISON:  Chest x-ray from same day. FINDINGS: Cardiovascular: Acute nonocclusive right upper and lower lobar and segmental pulmonary emboli. RV/LV ratio measures 0.9. Borderline cardiomegaly. Trace pericardial effusion. No thoracic aortic aneurysm or dissection. Coronary, aortic arch, and branch vessel atherosclerotic vascular disease. Mediastinum/Nodes: No enlarged mediastinal, hilar, or axillary lymph nodes. Thyroid gland, trachea, and esophagus demonstrate no significant findings. Lungs/Pleura: Minimal subsegmental atelectasis at the lung bases. No focal consolidation, pleural effusion, or pneumothorax. Upper Abdomen: No acute abnormality. Postsurgical changes at the gastroesophageal junction with recurrent small hiatal hernia. Musculoskeletal: No chest wall abnormality. No acute or significant osseous findings. Review of the MIP images confirms the above findings. IMPRESSION: 1. Acute nonocclusive right upper and lower lobar and segmental pulmonary emboli. Positive for  acute PE with CT evidence of right heart strain (RV/LV Ratio = 0.9.) consistent with at least submassive (intermediate risk) PE. The presence of right heart strain has been associated with an increased risk of morbidity and mortality. 2. Aortic Atherosclerosis (ICD10-I70.0). Critical Value/emergent results were called by telephone at the time of interpretation on 09/16/2021 at 8:27 am to provider [facility], who verbally acknowledged these results. Electronically Signed   By: Obie Dredge M.D.   On: 09/16/2021 08:34   ECHOCARDIOGRAM COMPLETE  Result Date: 09/17/2021  ECHOCARDIOGRAM REPORT   Patient Name:   Felice Deem. Date of Exam: 09/17/2021 Medical Rec #:  [id number]           Height:       67.0 in Accession #:    [id number]          Weight:       236.3 lb Date of Birth:  24-May-1951           BSA:          2.171 m Patient Age:    60 years            BP:           118/73 mmHg Patient Gender: M                   HR:           75 bpm. Exam Location:  Inpatient Procedure: 2D Echo, Color Doppler and Cardiac Doppler Indications:    I26.02 Pulmonary embolus  History:        Patient has prior history of Echocardiogram examinations, most                 recent 07/19/2019. Risk Factors:Hypertension and Dyslipidemia.                 Prior performed at [place]:    [place] Referring Phys: [phone] [place] Comments: Very poor apical window due to lung interference IMPRESSIONS  1. Left ventricular ejection fraction, by estimation, is 55 to 60%. The left ventricle has normal function. Left ventricular endocardial border not optimally defined to evaluate regional wall motion, thought no wall motion abnormalities seen in the short axis series.There is mild concentric left ventricular hypertrophy. Left ventricular diastolic parameters are indeterminate.  2. Right ventricular systolic function is normal. The right ventricular size is normal. Tricuspid regurgitation signal  is inadequate for assessing PA pressure.  3. Left atrial size was moderately dilated.  4. Right atrial size was mildly dilated.  5. A small pericardial effusion is present.  6. The mitral valve is grossly normal. No evidence of mitral valve regurgitation. No evidence of mitral stenosis.  7. The aortic valve was not well visualized. Aortic valve regurgitation is not visualized. Aortic valve sclerosis is present, with no evidence of aortic valve stenosis. Comparison(s): No prior Echocardiogram. FINDINGS  Left Ventricle: Left ventricular ejection fraction, by estimation, is 55 to 60%. The left ventricle has normal function. Left ventricular endocardial border not optimally defined to evaluate regional wall motion. The left ventricular internal cavity size was normal in size. There is mild concentric left ventricular hypertrophy. Left ventricular diastolic parameters are indeterminate. Right Ventricle: The right ventricular size is normal. No increase in right ventricular wall thickness. Right ventricular systolic function is normal. Tricuspid regurgitation signal is inadequate for assessing PA pressure. Left Atrium: Left atrial size was moderately dilated. Right Atrium: Right atrial size was mildly dilated. Pericardium: A small pericardial effusion is present. Mitral Valve: The mitral valve is grossly normal. No evidence of mitral valve regurgitation. No evidence of mitral valve stenosis. Tricuspid Valve: The tricuspid valve is grossly normal. Tricuspid valve regurgitation is not demonstrated. Aortic Valve: The aortic valve was not well visualized. Aortic valve regurgitation is not visualized. Aortic valve sclerosis is present, with no evidence of aortic valve stenosis. Pulmonic Valve: The pulmonic valve was not well visualized. Pulmonic valve regurgitation is not visualized. Aorta: The aortic root and  ascending aorta are structurally normal, with no evidence of dilitation. IAS/Shunts: The atrial septum is grossly  normal.  LEFT VENTRICLE PLAX 2D LVIDd:         4.00 cm   Diastology LVIDs:         2.50 cm   LV e' medial:    6.53 cm/s LV PW:         1.20 cm   LV E/e' medial:  11.3 LV IVS:        1.20 cm   LV e' lateral:   10.70 cm/s LVOT diam:     2.10 cm   LV E/e' lateral: 6.9 LV SV:         62 LV SV Index:   29 LVOT Area:     3.46 cm  RIGHT VENTRICLE RV S prime:     10.60 cm/s TAPSE (M-mode): 2.1 cm LEFT ATRIUM             Index        RIGHT ATRIUM           Index LA diam:        4.30 cm 1.98 cm/m   RA Area:     24.40 cm LA Vol (A2C):   93.9 ml 43.25 ml/m  RA Volume:   78.40 ml  36.11 ml/m LA Vol (A4C):   90.7 ml 41.78 ml/m LA Biplane Vol: 96.6 ml 44.50 ml/m  AORTIC VALVE LVOT Vmax:   97.90 cm/s LVOT Vmean:  68.000 cm/s LVOT VTI:    0.179 m  AORTA Ao Root diam: 3.50 cm Ao Asc diam:  2.80 cm MITRAL VALVE MV Area (PHT): 3.30 cm    SHUNTS MV Decel Time: 230 msec    Systemic VTI:  0.18 m MV E velocity: 73.70 cm/s  Systemic Diam: 2.10 cm MV A velocity: 56.10 cm/s MV E/A ratio:  1.31 Rudean Haskell MD Electronically signed by Rudean Haskell MD Signature Date/Time: 09/17/2021/12:56:54 PM    Final    VAS [place] LOWER EXTREMITY VENOUS (DVT)  Result Date: 09/16/2021  Lower Venous DVT Study Patient Name:  Tay Whitwell.  Date of Exam:   09/16/2021 Medical Rec #: [id number]            Accession #:    [id number] Date of Birth: [birth year]            Patient Gender: M Patient Age:   73 years Exam Location:  [facility] Procedure:      VAS [place] LOWER EXTREMITY VENOUS (DVT) Referring Phys: Cherylann Ratel --------------------------------------------------------------------------------  Indications: Pulmonary embolism.  Risk Factors: Surgery Left TKR - 07/15/2021. Comparison Study: No previous exams Performing Technologist: Jody Hill RVT, RDMS  Examination Guidelines: A complete evaluation includes B-mode imaging, spectral Doppler, color Doppler, and power Doppler as needed of all accessible portions of each vessel.  Bilateral testing is considered an integral part of a complete examination. Limited examinations for reoccurring indications may be performed as noted. The reflux portion of the exam is performed with the patient in reverse Trendelenburg.  +---------+---------------+---------+-----------+----------+--------------+  RIGHT     Compressibility Phasicity Spontaneity Properties Thrombus Aging  +---------+---------------+---------+-----------+----------+--------------+  CFV       Full            Yes       Yes                                    +---------+---------------+---------+-----------+----------+--------------+  SFJ       Full                                                             +---------+---------------+---------+-----------+----------+--------------+  FV Prox   Full            Yes       Yes                                    +---------+---------------+---------+-----------+----------+--------------+  FV Mid    Full            Yes       Yes                                    +---------+---------------+---------+-----------+----------+--------------+  FV Distal Full            Yes       Yes                                    +---------+---------------+---------+-----------+----------+--------------+  PFV       Full                                                             +---------+---------------+---------+-----------+----------+--------------+  POP       Full            Yes       Yes                                    +---------+---------------+---------+-----------+----------+--------------+  PTV       Full                                                             +---------+---------------+---------+-----------+----------+--------------+  PERO      Full                                                             +---------+---------------+---------+-----------+----------+--------------+   +---------+---------------+---------+-----------+----------+--------------+  LEFT       Compressibility Phasicity Spontaneity Properties Thrombus Aging  +---------+---------------+---------+-----------+----------+--------------+  CFV       Full            Yes       Yes                                    +---------+---------------+---------+-----------+----------+--------------+  SFJ       Full                                                             +---------+---------------+---------+-----------+----------+--------------+  FV Prox   Full            Yes       Yes                                    +---------+---------------+---------+-----------+----------+--------------+  FV Mid    Full            Yes       Yes                                    +---------+---------------+---------+-----------+----------+--------------+  FV Distal Full            Yes       Yes                                    +---------+---------------+---------+-----------+----------+--------------+  PFV       Full                                                             +---------+---------------+---------+-----------+----------+--------------+  POP       Full            Yes       Yes                                    +---------+---------------+---------+-----------+----------+--------------+  PTV       Full                                                             +---------+---------------+---------+-----------+----------+--------------+  PERO      None            No        No                     Acute           +---------+---------------+---------+-----------+----------+--------------+     Summary: BILATERAL: - No evidence of superficial venous thrombosis in the lower extremities, bilaterally. -No evidence of popliteal cyst, bilaterally. RIGHT: - There is no evidence of deep vein thrombosis in the lower extremity.  LEFT: - Findings consistent with acute deep vein thrombosis involving the left peroneal veins.  *See table(s) above for measurements and observations. Electronically signed by Deitra Mayo MD on  09/16/2021 at 1:54:45 PM.    Final

## 2021-10-14 NOTE — Assessment & Plan Note (Signed)
He has chronic musculoskeletal pain His previous imaging study did not show any evidence of fracture He is on calcium and vitamin D supplement and had received Zometa recently I recommend we continue pain medicine in the short-term I plan to repeat imaging study next month for further follow-up before his start new treatment

## 2021-10-14 NOTE — Assessment & Plan Note (Signed)
His last myeloma panel a month ago show stable disease control Due to recent provoked PE, lenalidomide was discontinued We will redraw another sample of myeloma panel today and I will call him with test results Assuming he has no evidence of disease progression, I think it is reasonable to wait until he returns back from his vacation in mid February to resume treatment We discussed the risk, benefits, side effects of combination treatment with daratumumab, Velcade and dexamethasone and he appears interested I will create treatment plan once his myeloma panel is available next week

## 2021-10-14 NOTE — Assessment & Plan Note (Signed)
He is doing well on chronic anticoagulation therapy without bleeding complications The PE was provoked by recent surgery as well as side effects of lenalidomide We discussed the risk of blood clots during travels He needs to be on anticoagulation therapy for 1 year, due to be completed by mid December 2023

## 2021-10-15 LAB — KAPPA/LAMBDA LIGHT CHAINS
Kappa free light chain: 31.2 mg/L — ABNORMAL HIGH (ref 3.3–19.4)
Kappa, lambda light chain ratio: 1.9 — ABNORMAL HIGH (ref 0.26–1.65)
Lambda free light chains: 16.4 mg/L (ref 5.7–26.3)

## 2021-10-20 LAB — MULTIPLE MYELOMA PANEL, SERUM
Albumin SerPl Elph-Mcnc: 3.4 g/dL (ref 2.9–4.4)
Albumin/Glob SerPl: 1.2 (ref 0.7–1.7)
Alpha 1: 0.3 g/dL (ref 0.0–0.4)
Alpha2 Glob SerPl Elph-Mcnc: 0.7 g/dL (ref 0.4–1.0)
B-Globulin SerPl Elph-Mcnc: 0.8 g/dL (ref 0.7–1.3)
Gamma Glob SerPl Elph-Mcnc: 1.2 g/dL (ref 0.4–1.8)
Globulin, Total: 2.9 g/dL (ref 2.2–3.9)
IgA: 107 mg/dL (ref 61–437)
IgG (Immunoglobin G), Serum: 1099 mg/dL (ref 603–1613)
IgM (Immunoglobulin M), Srm: 44 mg/dL (ref 20–172)
M Protein SerPl Elph-Mcnc: 0.3 g/dL — ABNORMAL HIGH
Total Protein ELP: 6.3 g/dL (ref 6.0–8.5)

## 2021-10-22 ENCOUNTER — Other Ambulatory Visit: Payer: Self-pay | Admitting: Hematology and Oncology

## 2021-10-22 DIAGNOSIS — C9000 Multiple myeloma not having achieved remission: Secondary | ICD-10-CM

## 2021-10-22 NOTE — Progress Notes (Signed)
DISCONTINUE ON PATHWAY REGIMEN - Multiple Myeloma and Other Plasma Cell Dyscrasias     A cycle is every 21 days:     Bortezomib      Lenalidomide      Dexamethasone   **Always confirm dose/schedule in your pharmacy ordering system**  REASON: Toxicities / Adverse Event PRIOR TREATMENT: [id number]: VRd (Bortezomib 1.3 mg/m2 Subcut D1, 4, 8, 11 + Lenalidomide 25 mg + Dexamethasone 20 mg) q21 Days x 4-6 Cycles Maximum Prior to [facility] [employer] TREATMENT RESPONSE: Stable Disease (SD)  START ON PATHWAY REGIMEN - Multiple Myeloma and Other Plasma Cell Dyscrasias     Cycles 1 through 3: A cycle is every 21 days:     Dexamethasone      Bortezomib      Daratumumab and hyaluronidase-fihj    Cycles 4 through 8: A cycle is every 21 days:     Dexamethasone      Bortezomib      Daratumumab and hyaluronidase-fihj    Cycles 9 and beyond: A cycle is every 28 days:     Daratumumab and hyaluronidase-fihj   **Always confirm dose/schedule in your pharmacy ordering system**  Patient Characteristics: Multiple Myeloma, Relapsed / Refractory, Second through Fourth Lines of Therapy, Fit or Candidate for Triplet Therapy, Lenalidomide-Refractory or Lenalidomide-based Regimen Not Preferred, Candidate for Anti-CD38 Antibody Disease Classification: Multiple Myeloma R-ISS Staging: II Therapeutic Status: Relapsed Line of Therapy: Second Line Anti-CD38 Antibody Candidacy: Candidate for Anti-CD38 Antibody Lenalidomide-based Regimen Preference/Candidacy: Lenalidomide-based Regimen Not Preferred Intent of Therapy: Non-Curative / Palliative Intent, Discussed with Patient

## 2021-10-23 ENCOUNTER — Telehealth: Payer: Self-pay | Admitting: Hematology and Oncology

## 2021-10-23 NOTE — Telephone Encounter (Signed)
Sch per 1/18, inbasket, pt aware

## 2021-10-29 ENCOUNTER — Other Ambulatory Visit: Payer: Self-pay | Admitting: Hematology and Oncology

## 2021-10-29 ENCOUNTER — Telehealth: Payer: Self-pay

## 2021-10-29 MED ORDER — OXYCODONE HCL 5 MG PO TABS: 5.0000 mg | ORAL_TABLET | ORAL | 0 refills | Status: DC | PRN

## 2021-10-29 NOTE — Telephone Encounter (Signed)
Pt called requesting refill on oxyCODONE, states he will be going out of town and needs a refill. This was routed to MD for approval. Pt aware and verbalized thanks.

## 2021-11-03 ENCOUNTER — Telehealth: Payer: Self-pay

## 2021-11-03 ENCOUNTER — Other Ambulatory Visit: Payer: Self-pay | Admitting: Hematology and Oncology

## 2021-11-03 DIAGNOSIS — M1712 Unilateral primary osteoarthritis, left knee: Secondary | ICD-10-CM | POA: Diagnosis not present

## 2021-11-03 MED ORDER — OXYCODONE HCL 5 MG PO TABS: 5.0000 mg | ORAL_TABLET | ORAL | 0 refills | Status: DC | PRN

## 2021-11-03 NOTE — Telephone Encounter (Signed)
done

## 2021-11-03 NOTE — Telephone Encounter (Signed)
He called and left a message. Oxycodone request failed 1/25 due to the system being down at [facility]. Per e-prescribing status, transmission to pharmacy failed.  He ask that you send again to pharmacy, Sorry for the convenience he said.

## 2021-11-03 NOTE — Telephone Encounter (Signed)
Called and given message Rx sent. He verbalized understanding.

## 2021-11-04 ENCOUNTER — Telehealth: Payer: Self-pay | Admitting: Family Medicine

## 2021-11-04 NOTE — Telephone Encounter (Signed)
Spoke with patient to schedule Medicare Annual Wellness Visit (AWV) either virtually or in office.  Patient stated he is getting ready to go on vacation and will call back after vacation  Last AWV 10/11/18  please schedule at anytime with health coach  This should be a 45 minute visit.

## 2021-11-18 NOTE — Progress Notes (Signed)
Pharmacist Chemotherapy Monitoring - Initial Assessment    Anticipated start date: 11/25/21   The following has been reviewed per standard work regarding the patient's treatment regimen: The patient's diagnosis, treatment plan and drug doses, and organ/hematologic function Lab orders and baseline tests specific to treatment regimen  The treatment plan start date, drug sequencing, and pre-medications Prior authorization status  Patient's documented medication list, including drug-drug interaction screen and prescriptions for anti-emetics and supportive care specific to the treatment regimen The drug concentrations, fluid compatibility, administration routes, and timing of the medications to be used The patient's access for treatment and lifetime cumulative dose history, if applicable  The patient's medication allergies and previous infusion related reactions, if applicable   Changes made to treatment plan:  N/A  Follow up needed:  Type and cross/phenotyping prior to dara   Philomena Course, RPH, 11/18/2021  1:50 PM

## 2021-11-24 ENCOUNTER — Other Ambulatory Visit: Payer: Self-pay

## 2021-11-24 ENCOUNTER — Inpatient Hospital Stay: Payer: Medicare Other | Attending: Hematology and Oncology

## 2021-11-24 ENCOUNTER — Encounter: Payer: Self-pay | Admitting: Hematology and Oncology

## 2021-11-24 ENCOUNTER — Inpatient Hospital Stay (HOSPITAL_BASED_OUTPATIENT_CLINIC_OR_DEPARTMENT_OTHER): Payer: Medicare Other | Admitting: Hematology and Oncology

## 2021-11-24 VITALS — BP 139/87 | HR 81 | Temp 98.8°F | Resp 18 | Ht 67.0 in | Wt 236.2 lb

## 2021-11-24 DIAGNOSIS — I700 Atherosclerosis of aorta: Secondary | ICD-10-CM | POA: Diagnosis not present

## 2021-11-24 DIAGNOSIS — C9000 Multiple myeloma not having achieved remission: Secondary | ICD-10-CM

## 2021-11-24 DIAGNOSIS — D72819 Decreased white blood cell count, unspecified: Secondary | ICD-10-CM | POA: Diagnosis not present

## 2021-11-24 DIAGNOSIS — Z86711 Personal history of pulmonary embolism: Secondary | ICD-10-CM | POA: Insufficient documentation

## 2021-11-24 DIAGNOSIS — Z7901 Long term (current) use of anticoagulants: Secondary | ICD-10-CM | POA: Diagnosis not present

## 2021-11-24 DIAGNOSIS — M7918 Myalgia, other site: Secondary | ICD-10-CM

## 2021-11-24 DIAGNOSIS — Z5112 Encounter for antineoplastic immunotherapy: Secondary | ICD-10-CM | POA: Diagnosis not present

## 2021-11-24 DIAGNOSIS — T451X5A Adverse effect of antineoplastic and immunosuppressive drugs, initial encounter: Secondary | ICD-10-CM

## 2021-11-24 DIAGNOSIS — Z79899 Other long term (current) drug therapy: Secondary | ICD-10-CM | POA: Diagnosis not present

## 2021-11-24 DIAGNOSIS — I2510 Atherosclerotic heart disease of native coronary artery without angina pectoris: Secondary | ICD-10-CM | POA: Insufficient documentation

## 2021-11-24 DIAGNOSIS — G8929 Other chronic pain: Secondary | ICD-10-CM | POA: Diagnosis not present

## 2021-11-24 DIAGNOSIS — G620 Drug-induced polyneuropathy: Secondary | ICD-10-CM | POA: Diagnosis not present

## 2021-11-24 DIAGNOSIS — D701 Agranulocytosis secondary to cancer chemotherapy: Secondary | ICD-10-CM | POA: Diagnosis not present

## 2021-11-24 LAB — TYPE AND SCREEN
ABO/RH(D): A POS
Antibody Screen: NEGATIVE

## 2021-11-24 LAB — CMP (CANCER CENTER ONLY)
ALT: 24 U/L (ref 0–44)
AST: 21 U/L (ref 15–41)
Albumin: 3.7 g/dL (ref 3.5–5.0)
Alkaline Phosphatase: 54 U/L (ref 38–126)
Anion gap: 6 (ref 5–15)
BUN: 18 mg/dL (ref 8–23)
CO2: 29 mmol/L (ref 22–32)
Calcium: 8.8 mg/dL — ABNORMAL LOW (ref 8.9–10.3)
Chloride: 105 mmol/L (ref 98–111)
Creatinine: 0.82 mg/dL (ref 0.61–1.24)
GFR, Estimated: >60 mL/min (ref >60)
Glucose, Bld: 127 mg/dL — ABNORMAL HIGH (ref 70–99)
Potassium: 4.1 mmol/L (ref 3.5–5.1)
Sodium: 140 mmol/L (ref 135–145)
Total Bilirubin: 0.8 mg/dL (ref 0.3–1.2)
Total Protein: 6.5 g/dL (ref 6.5–8.1)

## 2021-11-24 LAB — CBC WITH DIFFERENTIAL (CANCER CENTER ONLY)
Abs Immature Granulocytes: 0.01 10*3/uL (ref 0.00–0.07)
Basophils Absolute: 0 10*3/uL (ref 0.0–0.1)
Basophils Relative: 0 %
Eosinophils Absolute: 0.1 10*3/uL (ref 0.0–0.5)
Eosinophils Relative: 3 %
HCT: 37.6 % — ABNORMAL LOW (ref 39.0–52.0)
Hemoglobin: 13.3 g/dL (ref 13.0–17.0)
Immature Granulocytes: 0 %
Lymphocytes Relative: 25 %
Lymphs Abs: 0.9 10*3/uL (ref 0.7–4.0)
MCH: 37.4 pg — ABNORMAL HIGH (ref 26.0–34.0)
MCHC: 35.4 g/dL (ref 30.0–36.0)
MCV: 105.6 fL — ABNORMAL HIGH (ref 80.0–100.0)
Monocytes Absolute: 0.6 10*3/uL (ref 0.1–1.0)
Monocytes Relative: 15 %
Neutro Abs: 2.2 10*3/uL (ref 1.7–7.7)
Neutrophils Relative %: 57 %
Platelet Count: 166 10*3/uL (ref 150–400)
RBC: 3.56 MIL/uL — ABNORMAL LOW (ref 4.22–5.81)
RDW: 12.9 % (ref 11.5–15.5)
WBC Count: 3.8 10*3/uL — ABNORMAL LOW (ref 4.0–10.5)
nRBC: 0 % (ref 0.0–0.2)

## 2021-11-24 LAB — ABO/RH: ABO/RH(D): A POS

## 2021-11-24 LAB — PRETREATMENT RBC PHENOTYPE

## 2021-11-24 MED ORDER — PROCHLORPERAZINE MALEATE 10 MG PO TABS: 10.0000 mg | ORAL_TABLET | Freq: Four times a day (QID) | ORAL | 1 refills | Status: DC | PRN

## 2021-11-24 MED ORDER — OXYCODONE HCL 5 MG PO TABS: 5.0000 mg | ORAL_TABLET | ORAL | 0 refills | Status: DC | PRN

## 2021-11-24 MED ORDER — ONDANSETRON HCL 8 MG PO TABS: 8.0000 mg | ORAL_TABLET | Freq: Two times a day (BID) | ORAL | 1 refills | Status: DC | PRN

## 2021-11-24 NOTE — Assessment & Plan Note (Signed)
We reviewed the plan of care Due to recent pulmonary emboli, Revlimid was discontinued Per previous discussion, we are in agreement to switch treatment to combination of daratumumab, dexamethasone as well as Velcade I recommend minimum 3 months of treatment before consideration to switch to maintenance Side effects were fully discussed and he is in agreement I will see him in a week or so for toxicity review

## 2021-11-24 NOTE — Assessment & Plan Note (Signed)
He has history of peripheral neuropathy We will observe for signs of recurrent neuropathy while on treatment closely

## 2021-11-24 NOTE — Assessment & Plan Note (Signed)
He has history of mild intermittent leukopenia Observe closely

## 2021-11-24 NOTE — Assessment & Plan Note (Signed)
He has persistent mild back pain I refilled his prescription oxycodone

## 2021-11-24 NOTE — Progress Notes (Signed)
[facility] OFFICE PROGRESS NOTE  Patient Care Team: Denita Lung, MD as PCP - General (Family Medicine) Heath Lark, MD as Consulting Physician (Hematology and Oncology)  ASSESSMENT & PLAN:  Multiple myeloma without remission ([place]) We reviewed the plan of care Due to recent pulmonary emboli, Revlimid was discontinued Per previous discussion, we are in agreement to switch treatment to combination of daratumumab, dexamethasone as well as Velcade I recommend minimum 3 months of treatment before consideration to switch to maintenance Side effects were fully discussed and he is in agreement I will see him in a week or so for toxicity review  Leukopenia due to antineoplastic chemotherapy [facility]) He has history of mild intermittent leukopenia Observe closely  Chronic musculoskeletal pain He has persistent mild back pain I refilled his prescription oxycodone  Peripheral neuropathy due to chemotherapy [facility]) He has history of peripheral neuropathy We will observe for signs of recurrent neuropathy while on treatment closely  Orders Placed This Encounter  Procedures   ABO/Rh    All questions were answered. The patient knows to call the clinic with any problems, questions or concerns. The total time spent in the appointment was 30 minutes encounter with patients including review of chart and various tests results, discussions about plan of care and coordination of care plan   Heath Lark, MD 11/24/2021 1:55 PM  INTERVAL HISTORY: Please see below for problem oriented charting. he returns for treatment follow-up He has returned from his vacation He is doing very well except for mild weight gain His chronic pain is stable No recent flare of peripheral neuropathy Denies recent infection  REVIEW OF SYSTEMS:   Constitutional: Denies fevers, chills or abnormal weight loss Eyes: Denies blurriness of vision Ears, nose, mouth, throat, and face: Denies mucositis or sore  throat Respiratory: Denies cough, dyspnea or wheezes Cardiovascular: Denies palpitation, chest discomfort or lower extremity swelling Gastrointestinal:  Denies nausea, heartburn or change in bowel habits Skin: Denies abnormal skin rashes Lymphatics: Denies new lymphadenopathy or easy bruising Neurological:Denies numbness, tingling or new weaknesses Behavioral/Psych: Mood is stable, no new changes  All other systems were reviewed with the patient and are negative.  I have reviewed the past medical history, past surgical history, social history and family history with the patient and they are unchanged from previous note.  ALLERGIES:  has No Known Allergies.  MEDICATIONS:  Current Outpatient Medications  Medication Sig Dispense Refill   acetaminophen (TYLENOL) 650 MG CR tablet Take 650 mg by mouth every 8 (eight) hours as needed for pain.     acyclovir (ZOVIRAX) 400 MG tablet Take 1 tablet (400 mg total) by mouth 2 (two) times daily. (Patient taking differently: Take 400 mg by mouth daily.) 180 tablet 11   atorvastatin (LIPITOR) 20 MG tablet Take 1 tablet (20 mg total) by mouth daily. 90 tablet 4   calcium carbonate (TUMS - DOSED IN MG ELEMENTAL CALCIUM) 500 MG chewable tablet Chew 1-2 tablets by mouth daily as needed for heartburn.     Cholecalciferol (VITAMIN D) 50 MCG (2000 UT) tablet Take 2,000 Units by mouth daily.     folic acid (FOLVITE) 017 MCG tablet Take 400 mcg by mouth daily.     losartan-hydrochlorothiazide (HYZAAR) 100-12.5 MG tablet Take 1 tablet by mouth daily. 90 tablet 3   Multiple Vitamin (MULTI-VITAMIN) tablet Take 1 tablet by mouth daily.     ondansetron (ZOFRAN) 4 MG tablet Take 1 tablet (4 mg total) by mouth every 6 (six) hours as needed  for nausea. 20 tablet 0   ondansetron (ZOFRAN) 8 MG tablet Take 1 tablet (8 mg total) by mouth 2 (two) times daily as needed (Nausea or vomiting). 30 tablet 1   oxyCODONE (ROXICODONE) 5 MG immediate release tablet Take 1 tablet (5 mg  total) by mouth every 4 (four) hours as needed for severe pain. 60 tablet 0   Polyethyl Glycol-Propyl Glycol (SYSTANE OP) Place 1 drop into both eyes daily as needed (dry eyes).     prochlorperazine (COMPAZINE) 10 MG tablet Take 1 tablet (10 mg total) by mouth every 6 (six) hours as needed (Nausea or vomiting). 30 tablet 1   rivaroxaban (XARELTO) 20 MG TABS tablet Take 1 tablet (20 mg total) by mouth daily with supper. 90 tablet 1   vitamin B-12 (CYANOCOBALAMIN) 1000 MCG tablet Take 1,000 mcg by mouth daily.     No current facility-administered medications for this visit.    SUMMARY OF ONCOLOGIC HISTORY: Oncology History  Multiple myeloma without remission ([place])  03/20/2019 Imaging   1. Tumor involving the L5 and S1 and S2 segments of the spine as described with mass effects upon the left L4, L5, S1 and more distal left sacral nerves as described above. 2. Does the patient have a history of malignancy? 3. Multiple plasmacytomas and metastatic disease could give this appearance   03/27/2019 Pathology Results   Soft Tissue Needle Core Biopsy, sacral mass - PLASMABLASTIC NEOPLASM. - SEE COMMENT. Microscopic Comment The sections show needle core biopsy fragments of soft tissue densely infiltrated by a relatively monomorphic infiltrate of atypical plasmacytoid cells characterized by vesicular or partially clumped chromatin and prominent nucleoli. This is associated with scattered mitosis. A battery of immunohistochemical stains was performed and show that the atypical plasmacytoid cells are positive for CD138, CD43 and cytoplasmic kappa. There is also weak positivity for CD56 and partial variable positivity for cyclin D1. The atypical plasmacytoid cells are negative for cytoplasmic lambda, CD10, PAX5, CD79a, CD20, CD3, CD5, CD34, EBV (ISH) and mostly negative for LCA. The overall morphologic and histologic features are most compatible with a plasmablastic neoplasm. The differential diagnosis includes  plasmablastic lymphoma and plasmablastic plasmacytoma/myeloma. Based on the overall phenotypic features and the clinical setting, plasmacytoma/myeloma is favored. Clinical correlation and hematologic evaluation is recommended   03/27/2019 Procedure   Technically successful CT-guided biopsy of sacral soft tissue mass.   04/04/2019 Initial Diagnosis   Multiple myeloma without remission ([place])   04/11/2019 PET scan   IMPRESSION: 1. Previously noted expansile lesions involving the L5 vertebra and sacrum are again noted and exhibit intense FDG uptake compatible with metabolically active disease. A third focus of increased uptake without corresponding CT abnormality is noted within the intertrochanteric portions of the proximal right femur. 2. Small lucent lesion within the T3 vertebra is noted without corresponding FDG uptake. 3. Asymmetric left bladder wall thickening, etiology indeterminate. 4. Aortic Atherosclerosis (ICD10-I70.0). Lad coronary artery calcification.   04/12/2019 Cancer Staging   Staging form: Plasma Cell Myeloma and Plasma Cell Disorders, AJCC 8th Edition - Clinical stage from 04/12/2019: Beta-2-microglobulin (mg/L): 2, Albumin (g/dL): 3.6, ISS: Stage I, High-risk cytogenetics: Unknown, LDH: Unknown - Signed by Heath Lark, MD on 04/12/2019    04/14/2019 Bone Marrow Biopsy   Bone Marrow, Aspirate,Biopsy, and Clot, right iliac bone - PLASMA CELL MYELOMA.   04/17/2019 - 07/11/2019 Chemotherapy   The patient had bortezemib, revlimid and dexamethasone for chemotherapy treatment.     08/17/2019 Bone Marrow Transplant   He received high dose melphalan followed  by autologous stem cell transplant at [facility]   11/22/2019 Bone Marrow Biopsy   BONE MARROW biopsy at [facility]:       Mild monoclonal plasmacytosis (approximately 2%), kappa light chain restricted.    11/22/2019 PET scan   PET CT at [facility] 1.  Similar size and appearance of mildly hypermetabolic lytic lesion in the L5  vertebral body and posterior elements.  2.  Otherwise, no substantial FDG uptake compared to background bone marrow in the additional osseous lucent lesions.    12/20/2019 - 05/09/2020 Radiation Therapy   Radiation Treatment Dates: 12/20/2019 through 01/08/2020 Site Technique Total Dose (Gy) Dose per Fx (Gy) Completed Fx Beam Energies  Lumbar Spine: Spine 3D 35/35 2.5 14/14 15X        02/29/2020 PET scan   1. Similar size and appearance of expansile lytic lesion in the L5 vertebral body and posterior elements with minimal FDG activity.  2. Otherwise, no additional osseous lucent lesions with FDG uptake above background bone marrow   05/31/2020 -  Chemotherapy   The patient had Revlimid for chemotherapy treatment.     11/25/2021 -  Chemotherapy   Patient is on Treatment Plan : MYELOMA RELAPSED / REFRACTORY Daratumumab SQ + Bortezomib + Dexamethasone (DaraVd) q21d / Daratumumab SQ q28d        PHYSICAL EXAMINATION: ECOG PERFORMANCE STATUS: 1 - Symptomatic but completely ambulatory  Vitals:   11/24/21 1023  BP: 139/87  Pulse: 81  Resp: 18  Temp: 98.8 F (37.1 C)  SpO2: 97%   Filed Weights   11/24/21 1023  Weight: 236 lb 3.2 oz (107.1 kg)    GENERAL:alert, no distress and comfortable NEURO: alert & oriented x 3 with fluent speech, no focal motor/sensory deficits  LABORATORY DATA:  I have reviewed the data as listed    Component Value Date/Time   NA 140 11/24/2021 0954   NA 139 10/11/2018 1115   NA 139 04/05/2014 0849   K 4.1 11/24/2021 0954   K 4.4 04/05/2014 0849   CL 105 11/24/2021 0954   CO2 29 11/24/2021 0954   CO2 23 04/05/2014 0849   GLUCOSE 127 (H) 11/24/2021 0954   GLUCOSE 109 04/05/2014 0849   BUN 18 11/24/2021 0954   BUN 23 10/11/2018 1115   BUN 16.8 04/05/2014 0849   CREATININE 0.82 11/24/2021 0954   CREATININE 0.91 07/29/2017 1004   CREATININE 0.9 04/05/2014 0849   CALCIUM 8.8 (L) 11/24/2021 0954   CALCIUM 8.7 04/05/2014 0849   PROT 6.5 11/24/2021 0954    PROT 7.2 10/11/2018 1115   PROT 6.7 04/05/2014 0849   ALBUMIN 3.7 11/24/2021 0954   ALBUMIN 4.7 10/11/2018 1115   ALBUMIN 3.8 04/05/2014 0849   AST 21 11/24/2021 0954   AST 26 04/05/2014 0849   ALT 24 11/24/2021 0954   ALT 30 04/05/2014 0849   ALKPHOS 54 11/24/2021 0954   ALKPHOS 75 04/05/2014 0849   BILITOT 0.8 11/24/2021 0954   BILITOT 0.71 04/05/2014 0849   GFRNONAA >60 11/24/2021 0954   GFRAA >60 06/14/2020 0830    No results found for: SPEP, UPEP  Lab Results  Component Value Date   WBC 3.8 (L) 11/24/2021   NEUTROABS 2.2 11/24/2021   HGB 13.3 11/24/2021   HCT 37.6 (L) 11/24/2021   MCV 105.6 (H) 11/24/2021   PLT 166 11/24/2021      Chemistry      Component Value Date/Time   NA 140 11/24/2021 0954   NA 139 10/11/2018 1115  NA 139 04/05/2014 0849   K 4.1 11/24/2021 0954   K 4.4 04/05/2014 0849   CL 105 11/24/2021 0954   CO2 29 11/24/2021 0954   CO2 23 04/05/2014 0849   BUN 18 11/24/2021 0954   BUN 23 10/11/2018 1115   BUN 16.8 04/05/2014 0849   CREATININE 0.82 11/24/2021 0954   CREATININE 0.91 07/29/2017 1004   CREATININE 0.9 04/05/2014 0849   GLU 10 04/10/2014 0000      Component Value Date/Time   CALCIUM 8.8 (L) 11/24/2021 0954   CALCIUM 8.7 04/05/2014 0849   ALKPHOS 54 11/24/2021 0954   ALKPHOS 75 04/05/2014 0849   AST 21 11/24/2021 0954   AST 26 04/05/2014 0849   ALT 24 11/24/2021 0954   ALT 30 04/05/2014 0849   BILITOT 0.8 11/24/2021 0954   BILITOT 0.71 04/05/2014 0849

## 2021-11-25 ENCOUNTER — Inpatient Hospital Stay: Payer: Medicare Other

## 2021-11-25 VITALS — BP 134/85 | HR 82 | Temp 99.1°F | Resp 18 | Wt 235.4 lb

## 2021-11-25 DIAGNOSIS — D72819 Decreased white blood cell count, unspecified: Secondary | ICD-10-CM | POA: Diagnosis not present

## 2021-11-25 DIAGNOSIS — C9000 Multiple myeloma not having achieved remission: Secondary | ICD-10-CM | POA: Diagnosis not present

## 2021-11-25 DIAGNOSIS — Z79899 Other long term (current) drug therapy: Secondary | ICD-10-CM | POA: Diagnosis not present

## 2021-11-25 DIAGNOSIS — Z5112 Encounter for antineoplastic immunotherapy: Secondary | ICD-10-CM | POA: Diagnosis not present

## 2021-11-25 DIAGNOSIS — Z86711 Personal history of pulmonary embolism: Secondary | ICD-10-CM | POA: Diagnosis not present

## 2021-11-25 LAB — KAPPA/LAMBDA LIGHT CHAINS
Kappa free light chain: 29.7 mg/L — ABNORMAL HIGH (ref 3.3–19.4)
Kappa, lambda light chain ratio: 1.99 — ABNORMAL HIGH (ref 0.26–1.65)
Lambda free light chains: 14.9 mg/L (ref 5.7–26.3)

## 2021-11-25 MED ORDER — DARATUMUMAB-HYALURONIDASE-FIHJ 1800-30000 MG-UT/15ML ~~LOC~~ SOLN
1800.0000 mg | Freq: Once | SUBCUTANEOUS | Status: AC
  Administered 2021-11-25: 1800 mg via SUBCUTANEOUS
  Filled 2021-11-25: qty 15

## 2021-11-25 MED ORDER — BORTEZOMIB CHEMO SQ INJECTION 3.5 MG (2.5MG/ML)
1.0400 mg/m2 | Freq: Once | INTRAMUSCULAR | Status: AC
  Administered 2021-11-25: 2.25 mg via SUBCUTANEOUS
  Filled 2021-11-25: qty 0.9

## 2021-11-25 MED ORDER — DEXAMETHASONE 4 MG PO TABS
20.0000 mg | ORAL_TABLET | Freq: Once | ORAL | Status: AC
  Administered 2021-11-25: 20 mg via ORAL
  Filled 2021-11-25: qty 5

## 2021-11-25 MED ORDER — ACETAMINOPHEN 325 MG PO TABS
650.0000 mg | ORAL_TABLET | Freq: Once | ORAL | Status: AC
  Administered 2021-11-25: 650 mg via ORAL
  Filled 2021-11-25: qty 2

## 2021-11-25 MED ORDER — MONTELUKAST SODIUM 10 MG PO TABS
10.0000 mg | ORAL_TABLET | Freq: Once | ORAL | Status: AC
  Administered 2021-11-25: 10 mg via ORAL
  Filled 2021-11-25: qty 1

## 2021-11-25 MED ORDER — DIPHENHYDRAMINE HCL 25 MG PO CAPS
25.0000 mg | ORAL_CAPSULE | Freq: Once | ORAL | Status: AC
  Administered 2021-11-25: 25 mg via ORAL
  Filled 2021-11-25: qty 1

## 2021-11-25 NOTE — Progress Notes (Signed)
Patient observed for 2 hours following administration of Darzalex faspro. Vital signs retaken and remained stable. Patient showed no signs of distress upon discharge.

## 2021-11-25 NOTE — Patient Instructions (Signed)
[place] ONCOLOGY   Discharge Instructions: Thank you for choosing [facility] to provide your oncology and hematology care.   If you have a lab appointment with the [facility], please go directly to the [facility] and check in at the registration area.   Wear comfortable clothing and clothing appropriate for easy access to any Portacath or PICC line.   We strive to give you quality time with your provider. You may need to reschedule your appointment if you arrive late (15 or more minutes).  Arriving late affects you and other patients whose appointments are after yours.  Also, if you miss three or more appointments without notifying the office, you may be dismissed from the clinic at the providers discretion.      For prescription refill requests, have your pharmacy contact our office and allow 72 hours for refills to be completed.    Today you received the following chemotherapy and/or immunotherapy agents: Bortezomib (Velcade) and Daratumumab hyaluronidase (Darzalex faspro)      To help prevent nausea and vomiting after your treatment, we encourage you to take your nausea medication as directed.  BELOW ARE SYMPTOMS THAT SHOULD BE REPORTED IMMEDIATELY: *FEVER GREATER THAN 100.4 F (38 C) OR HIGHER *CHILLS OR SWEATING *NAUSEA AND VOMITING THAT IS NOT CONTROLLED WITH YOUR NAUSEA MEDICATION *UNUSUAL SHORTNESS OF BREATH *UNUSUAL BRUISING OR BLEEDING *URINARY PROBLEMS (pain or burning when urinating, or frequent urination) *BOWEL PROBLEMS (unusual diarrhea, constipation, pain near the anus) TENDERNESS IN MOUTH AND THROAT WITH OR WITHOUT PRESENCE OF ULCERS (sore throat, sores in mouth, or a toothache) UNUSUAL RASH, SWELLING OR PAIN  UNUSUAL VAGINAL DISCHARGE OR ITCHING   Items with * indicate a potential emergency and should be followed up as soon as possible or go to the Emergency Department if any problems should occur.  Please show the  CHEMOTHERAPY ALERT CARD or IMMUNOTHERAPY ALERT CARD at check-in to the Emergency Department and triage nurse.  Should you have questions after your visit or need to cancel or reschedule your appointment, please contact [facility]  Dept: [phone]  and follow the prompts.  Office hours are 8:00 a.m. to 4:30 p.m. Monday - Friday. Please note that voicemails left after 4:00 p.m. may not be returned until the following business day.  We are closed weekends and major holidays. You have access to a nurse at all times for urgent questions. Please call the main number to the clinic Dept: [phone] and follow the prompts.   For any non-urgent questions, you may also contact your provider using MyChart. We now offer e-Visits for anyone 39 and older to request care online for non-urgent symptoms. For details visit [email or web address].   Also download the MyChart app! Go to the app store, search "MyChart", open the app, select [facility], and log in with your MyChart username and password.  Due to Covid, a mask is required upon entering the hospital/clinic. If you do not have a mask, one will be given to you upon arrival. For doctor visits, patients may have 1 support person aged 64 or older with them. For treatment visits, patients cannot have anyone with them due to current Covid guidelines and our immunocompromised population.

## 2021-11-26 ENCOUNTER — Telehealth: Payer: Self-pay | Admitting: *Deleted

## 2021-11-26 NOTE — Telephone Encounter (Signed)
Called pt to see how he did after his treatment yest.  He reports feeling good, "better than he has felt in a while".  He denies any problems/concerns & knows how to reach [place] if needed.  He knows about appt on Friday.

## 2021-11-26 NOTE — Telephone Encounter (Signed)
-----   Message from Severiano Gilbert, RN sent at 11/25/2021  1:58 PM EST ----- Regarding: First Time Darazalex Faspro Injection - Dr. Alvy Bimler Patient First Time Darazalex Faspro Injection - Dr. Alvy Bimler Patient Patient tolerated injection with no issues.

## 2021-11-27 LAB — MULTIPLE MYELOMA PANEL, SERUM
Albumin SerPl Elph-Mcnc: 3.3 g/dL (ref 2.9–4.4)
Albumin/Glob SerPl: 1.3 (ref 0.7–1.7)
Alpha 1: 0.2 g/dL (ref 0.0–0.4)
Alpha2 Glob SerPl Elph-Mcnc: 0.7 g/dL (ref 0.4–1.0)
B-Globulin SerPl Elph-Mcnc: 0.8 g/dL (ref 0.7–1.3)
Gamma Glob SerPl Elph-Mcnc: 1 g/dL (ref 0.4–1.8)
Globulin, Total: 2.7 g/dL (ref 2.2–3.9)
IgA: 108 mg/dL (ref 61–437)
IgG (Immunoglobin G), Serum: 1060 mg/dL (ref 603–1613)
IgM (Immunoglobulin M), Srm: 28 mg/dL (ref 20–172)
M Protein SerPl Elph-Mcnc: 0.2 g/dL — ABNORMAL HIGH
Total Protein ELP: 6 g/dL (ref 6.0–8.5)

## 2021-11-28 ENCOUNTER — Inpatient Hospital Stay: Payer: Medicare Other

## 2021-11-28 ENCOUNTER — Other Ambulatory Visit: Payer: Self-pay

## 2021-11-28 VITALS — BP 117/72 | HR 84 | Temp 98.4°F | Resp 18

## 2021-11-28 DIAGNOSIS — Z79899 Other long term (current) drug therapy: Secondary | ICD-10-CM | POA: Diagnosis not present

## 2021-11-28 DIAGNOSIS — C9000 Multiple myeloma not having achieved remission: Secondary | ICD-10-CM

## 2021-11-28 DIAGNOSIS — Z5112 Encounter for antineoplastic immunotherapy: Secondary | ICD-10-CM | POA: Diagnosis not present

## 2021-11-28 DIAGNOSIS — D72819 Decreased white blood cell count, unspecified: Secondary | ICD-10-CM | POA: Diagnosis not present

## 2021-11-28 DIAGNOSIS — Z86711 Personal history of pulmonary embolism: Secondary | ICD-10-CM | POA: Diagnosis not present

## 2021-11-28 MED ORDER — BORTEZOMIB CHEMO SQ INJECTION 3.5 MG (2.5MG/ML)
1.0400 mg/m2 | Freq: Once | INTRAMUSCULAR | Status: AC
  Administered 2021-11-28: 2.25 mg via SUBCUTANEOUS
  Filled 2021-11-28: qty 0.9

## 2021-11-28 NOTE — Patient Instructions (Signed)
[facility]  Discharge Instructions: Thank you for choosing [facility] to provide your oncology and hematology care.   If you have a lab appointment with the Cancer Center, please go directly to the Cancer Center and check in at the registration area.   Wear comfortable clothing and clothing appropriate for easy access to any Portacath or PICC line.   We strive to give you quality time with your provider. You may need to reschedule your appointment if you arrive late (15 or more minutes).  Arriving late affects you and other patients whose appointments are after yours.  Also, if you miss three or more appointments without notifying the office, you may be dismissed from the clinic at the provider's discretion.      For prescription refill requests, have your pharmacy contact our office and allow 72 hours for refills to be completed.    Today you received the following chemotherapy and/or immunotherapy agents boretizomib    To help prevent nausea and vomiting after your treatment, we encourage you to take your nausea medication as directed.  BELOW ARE SYMPTOMS THAT SHOULD BE REPORTED IMMEDIATELY: . *FEVER GREATER THAN 100.4 F (38 C) OR HIGHER . *CHILLS OR SWEATING . *NAUSEA AND VOMITING THAT IS NOT CONTROLLED WITH YOUR NAUSEA MEDICATION . *UNUSUAL SHORTNESS OF BREATH . *UNUSUAL BRUISING OR BLEEDING . *URINARY PROBLEMS (pain or burning when urinating, or frequent urination) . *BOWEL PROBLEMS (unusual diarrhea, constipation, pain near the anus) . TENDERNESS IN MOUTH AND THROAT WITH OR WITHOUT PRESENCE OF ULCERS (sore throat, sores in mouth, or a toothache) . UNUSUAL RASH, SWELLING OR PAIN  . UNUSUAL VAGINAL DISCHARGE OR ITCHING   Items with * indicate a potential emergency and should be followed up as soon as possible or go to the Emergency Department if any problems should occur.  Please show the CHEMOTHERAPY ALERT CARD or IMMUNOTHERAPY ALERT  CARD at check-in to the Emergency Department and triage nurse.  Should you have questions after your visit or need to cancel or reschedule your appointment, please contact [facility]  Dept: [phone]  and follow the prompts.  Office hours are 8:00 a.m. to 4:30 p.m. Monday - Friday. Please note that voicemails left after 4:00 p.m. may not be returned until the following business day.  We are closed weekends and major holidays. You have access to a nurse at all times for urgent questions. Please call the main number to the clinic Dept: [phone] and follow the prompts.   For any non-urgent questions, you may also contact your provider using MyChart. We now offer e-Visits for anyone 18 and older to request care online for non-urgent symptoms. For details visit [email or web address].   Also download the MyChart app! Go to the app store, search "MyChart", open the app, select [facility], and log in with your MyChart username and password.  Due to Covid, a mask is required upon entering the hospital/clinic. If you do not have a mask, one will be given to you upon arrival. For doctor visits, patients may have 1 support person aged 18 or older with them. For treatment visits, patients cannot have anyone with them due to current Covid guidelines and our immunocompromised population.   

## 2021-12-02 ENCOUNTER — Inpatient Hospital Stay: Payer: Medicare Other

## 2021-12-02 ENCOUNTER — Inpatient Hospital Stay (HOSPITAL_BASED_OUTPATIENT_CLINIC_OR_DEPARTMENT_OTHER): Payer: Medicare Other | Admitting: Hematology and Oncology

## 2021-12-02 ENCOUNTER — Encounter: Payer: Self-pay | Admitting: Hematology and Oncology

## 2021-12-02 ENCOUNTER — Other Ambulatory Visit: Payer: Self-pay

## 2021-12-02 VITALS — BP 132/79 | HR 71 | Resp 17

## 2021-12-02 VITALS — BP 126/87 | HR 73 | Temp 98.3°F | Resp 18 | Ht 67.0 in | Wt 231.8 lb

## 2021-12-02 DIAGNOSIS — C9000 Multiple myeloma not having achieved remission: Secondary | ICD-10-CM | POA: Diagnosis not present

## 2021-12-02 DIAGNOSIS — D72819 Decreased white blood cell count, unspecified: Secondary | ICD-10-CM | POA: Diagnosis not present

## 2021-12-02 DIAGNOSIS — Z5112 Encounter for antineoplastic immunotherapy: Secondary | ICD-10-CM | POA: Diagnosis not present

## 2021-12-02 DIAGNOSIS — M7918 Myalgia, other site: Secondary | ICD-10-CM

## 2021-12-02 DIAGNOSIS — G8929 Other chronic pain: Secondary | ICD-10-CM | POA: Diagnosis not present

## 2021-12-02 DIAGNOSIS — Z86711 Personal history of pulmonary embolism: Secondary | ICD-10-CM | POA: Diagnosis not present

## 2021-12-02 DIAGNOSIS — D61818 Other pancytopenia: Secondary | ICD-10-CM | POA: Diagnosis not present

## 2021-12-02 DIAGNOSIS — Z79899 Other long term (current) drug therapy: Secondary | ICD-10-CM | POA: Diagnosis not present

## 2021-12-02 LAB — CMP (CANCER CENTER ONLY)
ALT: 29 U/L (ref 0–44)
AST: 17 U/L (ref 15–41)
Albumin: 3.9 g/dL (ref 3.5–5.0)
Alkaline Phosphatase: 54 U/L (ref 38–126)
Anion gap: 4 — ABNORMAL LOW (ref 5–15)
BUN: 27 mg/dL — ABNORMAL HIGH (ref 8–23)
CO2: 30 mmol/L (ref 22–32)
Calcium: 9.3 mg/dL (ref 8.9–10.3)
Chloride: 103 mmol/L (ref 98–111)
Creatinine: 0.85 mg/dL (ref 0.61–1.24)
GFR, Estimated: >60 mL/min (ref >60)
Glucose, Bld: 79 mg/dL (ref 70–99)
Potassium: 4.2 mmol/L (ref 3.5–5.1)
Sodium: 137 mmol/L (ref 135–145)
Total Bilirubin: 0.7 mg/dL (ref 0.3–1.2)
Total Protein: 6.4 g/dL — ABNORMAL LOW (ref 6.5–8.1)

## 2021-12-02 LAB — CBC WITH DIFFERENTIAL (CANCER CENTER ONLY)
Abs Immature Granulocytes: 0.01 10*3/uL (ref 0.00–0.07)
Basophils Absolute: 0 10*3/uL (ref 0.0–0.1)
Basophils Relative: 0 %
Eosinophils Absolute: 0.2 10*3/uL (ref 0.0–0.5)
Eosinophils Relative: 3 %
HCT: 41 % (ref 39.0–52.0)
Hemoglobin: 14 g/dL (ref 13.0–17.0)
Immature Granulocytes: 0 %
Lymphocytes Relative: 25 %
Lymphs Abs: 1.3 10*3/uL (ref 0.7–4.0)
MCH: 36.4 pg — ABNORMAL HIGH (ref 26.0–34.0)
MCHC: 34.1 g/dL (ref 30.0–36.0)
MCV: 106.5 fL — ABNORMAL HIGH (ref 80.0–100.0)
Monocytes Absolute: 1 10*3/uL (ref 0.1–1.0)
Monocytes Relative: 19 %
Neutro Abs: 2.8 10*3/uL (ref 1.7–7.7)
Neutrophils Relative %: 53 %
Platelet Count: 156 10*3/uL (ref 150–400)
RBC: 3.85 MIL/uL — ABNORMAL LOW (ref 4.22–5.81)
RDW: 13.1 % (ref 11.5–15.5)
WBC Count: 5.2 10*3/uL (ref 4.0–10.5)
nRBC: 0 % (ref 0.0–0.2)

## 2021-12-02 MED ORDER — DIPHENHYDRAMINE HCL 25 MG PO CAPS
25.0000 mg | ORAL_CAPSULE | Freq: Once | ORAL | Status: AC
  Administered 2021-12-02: 25 mg via ORAL
  Filled 2021-12-02: qty 1

## 2021-12-02 MED ORDER — DEXAMETHASONE 4 MG PO TABS
20.0000 mg | ORAL_TABLET | Freq: Once | ORAL | Status: AC
  Administered 2021-12-02: 20 mg via ORAL
  Filled 2021-12-02: qty 5

## 2021-12-02 MED ORDER — BORTEZOMIB CHEMO SQ INJECTION 3.5 MG (2.5MG/ML)
1.0400 mg/m2 | Freq: Once | INTRAMUSCULAR | Status: AC
  Administered 2021-12-02: 2.25 mg via SUBCUTANEOUS
  Filled 2021-12-02: qty 0.9

## 2021-12-02 MED ORDER — MONTELUKAST SODIUM 10 MG PO TABS
10.0000 mg | ORAL_TABLET | Freq: Once | ORAL | Status: AC
  Administered 2021-12-02: 10 mg via ORAL
  Filled 2021-12-02: qty 1

## 2021-12-02 MED ORDER — DARATUMUMAB-HYALURONIDASE-FIHJ 1800-30000 MG-UT/15ML ~~LOC~~ SOLN
1800.0000 mg | Freq: Once | SUBCUTANEOUS | Status: AC
  Administered 2021-12-02: 1800 mg via SUBCUTANEOUS
  Filled 2021-12-02: qty 15

## 2021-12-02 MED ORDER — ACETAMINOPHEN 325 MG PO TABS
650.0000 mg | ORAL_TABLET | Freq: Once | ORAL | Status: AC
  Administered 2021-12-02: 650 mg via ORAL
  Filled 2021-12-02: qty 2

## 2021-12-02 NOTE — Patient Instructions (Addendum)
[facility] ONCOL[facility]  Discharge Instructions: Thank you for choosing [facility] to provide your oncology and hematology care.   If you have a lab appointment with the [facility], please go directly to the [facility] and check in at the registration area.   Wear comfortable clothing and clothing appropriate for easy access to any Portacath or PICC line.   We strive to give you quality time with your provider. You may need to reschedule your appointment if you arrive late (15 or more minutes).  Arriving late affects you and other patients whose appointments are after yours.  Also, if you miss three or more appointments without notifying the office, you may be dismissed from the clinic at the providers discretion.      For prescription refill requests, have your pharmacy contact our office and allow 72 hours for refills to be completed.    Today you received the following chemotherapy and/or immunotherapy agents: Velcade and  Darzalex Faspro    To help prevent nausea and vomiting after your treatment, we encourage you to take your nausea medication as directed.  BELOW ARE SYMPTOMS THAT SHOULD BE REPORTED IMMEDIATELY: *FEVER GREATER THAN 100.4 F (38 C) OR HIGHER *CHILLS OR SWEATING *NAUSEA AND VOMITING THAT IS NOT CONTROLLED WITH YOUR NAUSEA MEDICATION *UNUSUAL SHORTNESS OF BREATH *UNUSUAL BRUISING OR BLEEDING *URINARY PROBLEMS (pain or burning when urinating, or frequent urination) *BOWEL PROBLEMS (unusual diarrhea, constipation, pain near the anus) TENDERNESS IN MOUTH AND THROAT WITH OR WITHOUT PRESENCE OF ULCERS (sore throat, sores in mouth, or a toothache) UNUSUAL RASH, SWELLING OR PAIN  UNUSUAL VAGINAL DISCHARGE OR ITCHING   Items with * indicate a potential emergency and should be followed up as soon as possible or go to the Emergency Department if any problems should occur.  Please show the CHEMOTHERAPY ALERT CARD or IMMUNOTHERAPY ALERT  CARD at check-in to the Emergency Department and triage nurse.  Should you have questions after your visit or need to cancel or reschedule your appointment, please contact [facility]  Dept: [phone]  and follow the prompts.  Office hours are 8:00 a.m. to 4:30 p.m. Monday - Friday. Please note that voicemails left after 4:00 p.m. may not be returned until the following business day.  We are closed weekends and major holidays. You have access to a nurse at all times for urgent questions. Please call the main number to the clinic Dept: [phone] and follow the prompts.   For any non-urgent questions, you may also contact your provider using MyChart. We now offer e-Visits for anyone 33 and older to request care online for non-urgent symptoms. For details visit [email or web address].   Also download the MyChart app! Go to the app store, search "MyChart", open the app, select , and log in with your MyChart username and password.  Due to Covid, a mask is required upon entering the hospital/clinic. If you do not have a mask, one will be given to you upon arrival. For doctor visits, patients may have 1 support person aged 50 or older with them. For treatment visits, patients cannot have anyone with them due to current Covid guidelines and our immunocompromised population.

## 2021-12-02 NOTE — Assessment & Plan Note (Signed)
Per patient request, I will order ferritin to be drawn next week for evaluation

## 2021-12-02 NOTE — Assessment & Plan Note (Signed)
His chronic pain is stable He does not need medication refill today

## 2021-12-02 NOTE — Assessment & Plan Note (Signed)
We reviewed the plan of care So far, he tolerated treatment very well without side effects We will continue treatment for few months

## 2021-12-02 NOTE — Progress Notes (Signed)
[facility] OFFICE PROGRESS NOTE  Patient Care Team: Denita Lung, MD as PCP - General (Family Medicine) Heath Lark, MD as Consulting Physician (Hematology and Oncology)  ASSESSMENT & PLAN:  Multiple myeloma without remission [facility]) We reviewed the plan of care So far, he tolerated treatment very well without side effects We will continue treatment for few months  Hemochromatosis, hereditary Per patient request, I will order ferritin to be drawn next week for evaluation  Chronic musculoskeletal pain His chronic pain is stable He does not need medication refill today  Orders Placed This Encounter  Procedures   Ferritin    Standing Status:   Future    Standing Expiration Date:   12/02/2022    All questions were answered. The patient knows to call the clinic with any problems, questions or concerns. The total time spent in the appointment was 20 minutes encounter with patients including review of chart and various tests results, discussions about plan of care and coordination of care plan   Heath Lark, MD 12/02/2021 2:05 PM  INTERVAL HISTORY: Please see below for problem oriented charting. he returns for treatment follow-up He tolerated treatment well Denies worsening pain No side effects from treatment so far No peripheral neuropathy  REVIEW OF SYSTEMS:   Constitutional: Denies fevers, chills or abnormal weight loss Eyes: Denies blurriness of vision Ears, nose, mouth, throat, and face: Denies mucositis or sore throat Respiratory: Denies cough, dyspnea or wheezes Cardiovascular: Denies palpitation, chest discomfort or lower extremity swelling Gastrointestinal:  Denies nausea, heartburn or change in bowel habits Skin: Denies abnormal skin rashes Lymphatics: Denies new lymphadenopathy or easy bruising Neurological:Denies numbness, tingling or new weaknesses Behavioral/Psych: Mood is stable, no new changes  All other systems were reviewed with the patient  and are negative.  I have reviewed the past medical history, past surgical history, social history and family history with the patient and they are unchanged from previous note.  ALLERGIES:  has No Known Allergies.  MEDICATIONS:  Current Outpatient Medications  Medication Sig Dispense Refill   acetaminophen (TYLENOL) 650 MG CR tablet Take 650 mg by mouth every 8 (eight) hours as needed for pain.     acyclovir (ZOVIRAX) 400 MG tablet Take 1 tablet (400 mg total) by mouth 2 (two) times daily. (Patient taking differently: Take 400 mg by mouth daily.) 180 tablet 11   atorvastatin (LIPITOR) 20 MG tablet Take 1 tablet (20 mg total) by mouth daily. 90 tablet 4   calcium carbonate (TUMS - DOSED IN MG ELEMENTAL CALCIUM) 500 MG chewable tablet Chew 1-2 tablets by mouth daily as needed for heartburn.     Cholecalciferol (VITAMIN D) 50 MCG (2000 UT) tablet Take 2,000 Units by mouth daily.     folic acid (FOLVITE) 824 MCG tablet Take 400 mcg by mouth daily.     losartan-hydrochlorothiazide (HYZAAR) 100-12.5 MG tablet Take 1 tablet by mouth daily. 90 tablet 3   Multiple Vitamin (MULTI-VITAMIN) tablet Take 1 tablet by mouth daily.     ondansetron (ZOFRAN) 4 MG tablet Take 1 tablet (4 mg total) by mouth every 6 (six) hours as needed for nausea. 20 tablet 0   ondansetron (ZOFRAN) 8 MG tablet Take 1 tablet (8 mg total) by mouth 2 (two) times daily as needed (Nausea or vomiting). 30 tablet 1   oxyCODONE (ROXICODONE) 5 MG immediate release tablet Take 1 tablet (5 mg total) by mouth every 4 (four) hours as needed for severe pain. 60 tablet 0   Polyethyl  Glycol-Propyl Glycol (SYSTANE OP) Place 1 drop into both eyes daily as needed (dry eyes).     prochlorperazine (COMPAZINE) 10 MG tablet Take 1 tablet (10 mg total) by mouth every 6 (six) hours as needed (Nausea or vomiting). 30 tablet 1   rivaroxaban (XARELTO) 20 MG TABS tablet Take 1 tablet (20 mg total) by mouth daily with supper. 90 tablet 1   vitamin B-12  (CYANOCOBALAMIN) 1000 MCG tablet Take 1,000 mcg by mouth daily.     No current facility-administered medications for this visit.    SUMMARY OF ONCOLOGIC HISTORY: Oncology History  Multiple myeloma without remission ([place])  03/20/2019 Imaging   1. Tumor involving the L5 and S1 and S2 segments of the spine as described with mass effects upon the left L4, L5, S1 and more distal left sacral nerves as described above. 2. Does the patient have a history of malignancy? 3. Multiple plasmacytomas and metastatic disease could give this appearance   03/27/2019 Pathology Results   Soft Tissue Needle Core Biopsy, sacral mass - PLASMABLASTIC NEOPLASM. - SEE COMMENT. Microscopic Comment The sections show needle core biopsy fragments of soft tissue densely infiltrated by a relatively monomorphic infiltrate of atypical plasmacytoid cells characterized by vesicular or partially clumped chromatin and prominent nucleoli. This is associated with scattered mitosis. A battery of immunohistochemical stains was performed and show that the atypical plasmacytoid cells are positive for CD138, CD43 and cytoplasmic kappa. There is also weak positivity for CD56 and partial variable positivity for cyclin D1. The atypical plasmacytoid cells are negative for cytoplasmic lambda, CD10, PAX5, CD79a, CD20, CD3, CD5, CD34, EBV (ISH) and mostly negative for LCA. The overall morphologic and histologic features are most compatible with a plasmablastic neoplasm. The differential diagnosis includes plasmablastic lymphoma and plasmablastic plasmacytoma/myeloma. Based on the overall phenotypic features and the clinical setting, plasmacytoma/myeloma is favored. Clinical correlation and hematologic evaluation is recommended   03/27/2019 Procedure   Technically successful CT-guided biopsy of sacral soft tissue mass.   04/04/2019 Initial Diagnosis   Multiple myeloma without remission ([place])   04/11/2019 PET scan   IMPRESSION: 1. Previously noted  expansile lesions involving the L5 vertebra and sacrum are again noted and exhibit intense FDG uptake compatible with metabolically active disease. A third focus of increased uptake without corresponding CT abnormality is noted within the intertrochanteric portions of the proximal right femur. 2. Small lucent lesion within the T3 vertebra is noted without corresponding FDG uptake. 3. Asymmetric left bladder wall thickening, etiology indeterminate. 4. Aortic Atherosclerosis (ICD10-I70.0). Lad coronary artery calcification.   04/12/2019 Cancer Staging   Staging form: Plasma Cell Myeloma and Plasma Cell Disorders, AJCC 8th Edition - Clinical stage from 04/12/2019: Beta-2-microglobulin (mg/L): 2, Albumin (g/dL): 3.6, ISS: Stage I, High-risk cytogenetics: Unknown, LDH: Unknown - Signed by Heath Lark, MD on 04/12/2019    04/14/2019 Bone Marrow Biopsy   Bone Marrow, Aspirate,Biopsy, and Clot, right iliac bone - PLASMA CELL MYELOMA.   04/17/2019 - 07/11/2019 Chemotherapy   The patient had bortezemib, revlimid and dexamethasone for chemotherapy treatment.     08/17/2019 Bone Marrow Transplant   He received high dose melphalan followed by autologous stem cell transplant at [facility]   11/22/2019 Bone Marrow Biopsy   BONE MARROW biopsy at [facility]:       Mild monoclonal plasmacytosis (approximately 2%), kappa light chain restricted.    11/22/2019 PET scan   [facility] at [facility] 1.  Similar size and appearance of mildly hypermetabolic lytic lesion in the L5  vertebral body and posterior elements.  2.  Otherwise, no substantial FDG uptake compared to background bone marrow in the additional osseous lucent lesions.    12/20/2019 - 05/09/2020 Radiation Therapy   Radiation Treatment Dates: 12/20/2019 through 01/08/2020 Site Technique Total Dose (Gy) Dose per Fx (Gy) Completed Fx Beam Energies  Lumbar Spine: Spine 3D 35/35 2.5 14/14 15X        02/29/2020 PET scan   1. Similar size and appearance of  expansile lytic lesion in the L5 vertebral body and posterior elements with minimal FDG activity.  2. Otherwise, no additional osseous lucent lesions with FDG uptake above background bone marrow   05/31/2020 -  Chemotherapy   The patient had Revlimid for chemotherapy treatment.     11/25/2021 -  Chemotherapy   Patient is on Treatment Plan : MYELOMA RELAPSED / REFRACTORY Daratumumab SQ + Bortezomib + Dexamethasone (DaraVd) q21d / Daratumumab SQ q28d        PHYSICAL EXAMINATION: ECOG PERFORMANCE STATUS: 0 - Asymptomatic  Vitals:   12/02/21 1045  BP: 126/87  Pulse: 73  Resp: 18  Temp: 98.3 F (36.8 C)  SpO2: 97%   Filed Weights   12/02/21 1045  Weight: 231 lb 12.8 oz (105.1 kg)    GENERAL:alert, no distress and comfortable NEURO: alert & oriented x 3 with fluent speech, no focal motor/sensory deficits  LABORATORY DATA:  I have reviewed the data as listed    Component Value Date/Time   NA 137 12/02/2021 1036   NA 139 10/11/2018 1115   NA 139 04/05/2014 0849   K 4.2 12/02/2021 1036   K 4.4 04/05/2014 0849   CL 103 12/02/2021 1036   CO2 30 12/02/2021 1036   CO2 23 04/05/2014 0849   GLUCOSE 79 12/02/2021 1036   GLUCOSE 109 04/05/2014 0849   BUN 27 (H) 12/02/2021 1036   BUN 23 10/11/2018 1115   BUN 16.8 04/05/2014 0849   CREATININE 0.85 12/02/2021 1036   CREATININE 0.91 07/29/2017 1004   CREATININE 0.9 04/05/2014 0849   CALCIUM 9.3 12/02/2021 1036   CALCIUM 8.7 04/05/2014 0849   PROT 6.4 (L) 12/02/2021 1036   PROT 7.2 10/11/2018 1115   PROT 6.7 04/05/2014 0849   ALBUMIN 3.9 12/02/2021 1036   ALBUMIN 4.7 10/11/2018 1115   ALBUMIN 3.8 04/05/2014 0849   AST 17 12/02/2021 1036   AST 26 04/05/2014 0849   ALT 29 12/02/2021 1036   ALT 30 04/05/2014 0849   ALKPHOS 54 12/02/2021 1036   ALKPHOS 75 04/05/2014 0849   BILITOT 0.7 12/02/2021 1036   BILITOT 0.71 04/05/2014 0849   GFRNONAA >60 12/02/2021 1036   GFRAA >60 06/14/2020 0830    No results found for: SPEP,  UPEP  Lab Results  Component Value Date   WBC 5.2 12/02/2021   NEUTROABS 2.8 12/02/2021   HGB 14.0 12/02/2021   HCT 41.0 12/02/2021   MCV 106.5 (H) 12/02/2021   PLT 156 12/02/2021      Chemistry      Component Value Date/Time   NA 137 12/02/2021 1036   NA 139 10/11/2018 1115   NA 139 04/05/2014 0849   K 4.2 12/02/2021 1036   K 4.4 04/05/2014 0849   CL 103 12/02/2021 1036   CO2 30 12/02/2021 1036   CO2 23 04/05/2014 0849   BUN 27 (H) 12/02/2021 1036   BUN 23 10/11/2018 1115   BUN 16.8 04/05/2014 0849   CREATININE 0.85 12/02/2021 1036   CREATININE 0.91 07/29/2017 1004  CREATININE 0.9 04/05/2014 0849   GLU 10 04/10/2014 0000      Component Value Date/Time   CALCIUM 9.3 12/02/2021 1036   CALCIUM 8.7 04/05/2014 0849   ALKPHOS 54 12/02/2021 1036   ALKPHOS 75 04/05/2014 0849   AST 17 12/02/2021 1036   AST 26 04/05/2014 0849   ALT 29 12/02/2021 1036   ALT 30 04/05/2014 0849   BILITOT 0.7 12/02/2021 1036   BILITOT 0.71 04/05/2014 0849

## 2021-12-04 DIAGNOSIS — Z20822 Contact with and (suspected) exposure to covid-19: Secondary | ICD-10-CM | POA: Diagnosis not present

## 2021-12-05 ENCOUNTER — Other Ambulatory Visit: Payer: Self-pay

## 2021-12-05 ENCOUNTER — Inpatient Hospital Stay: Payer: Medicare Other | Attending: Hematology and Oncology

## 2021-12-05 VITALS — BP 121/71 | HR 87 | Temp 98.0°F | Resp 16 | Wt 230.5 lb

## 2021-12-05 DIAGNOSIS — Z5112 Encounter for antineoplastic immunotherapy: Secondary | ICD-10-CM | POA: Insufficient documentation

## 2021-12-05 DIAGNOSIS — D696 Thrombocytopenia, unspecified: Secondary | ICD-10-CM | POA: Diagnosis not present

## 2021-12-05 DIAGNOSIS — D61818 Other pancytopenia: Secondary | ICD-10-CM | POA: Diagnosis not present

## 2021-12-05 DIAGNOSIS — C9000 Multiple myeloma not having achieved remission: Secondary | ICD-10-CM

## 2021-12-05 MED ORDER — BORTEZOMIB CHEMO SQ INJECTION 3.5 MG (2.5MG/ML)
1.0400 mg/m2 | Freq: Once | INTRAMUSCULAR | Status: AC
  Administered 2021-12-05: 2.25 mg via SUBCUTANEOUS
  Filled 2021-12-05: qty 0.9

## 2021-12-05 NOTE — Patient Instructions (Signed)
[facility]   ?Discharge Instructions: ?Thank you for choosing [facility] to provide your oncology and hematology care.  ? ?If you have a lab appointment with the Cancer Center, please go directly to the Cancer Center and check in at the registration area. ?  ?Wear comfortable clothing and clothing appropriate for easy access to any Portacath or PICC line.  ? ?We strive to give you quality time with your provider. You may need to reschedule your appointment if you arrive late (15 or more minutes).  Arriving late affects you and other patients whose appointments are after yours.  Also, if you miss three or more appointments without notifying the office, you may be dismissed from the clinic at the provider?s discretion.    ?  ?For prescription refill requests, have your pharmacy contact our office and allow 72 hours for refills to be completed.   ? ?Today you received the following chemotherapy and/or immunotherapy agents: bortezomib    ?  ?To help prevent nausea and vomiting after your treatment, we encourage you to take your nausea medication as directed. ? ?BELOW ARE SYMPTOMS THAT SHOULD BE REPORTED IMMEDIATELY: ?*FEVER GREATER THAN 100.4 F (38 ?C) OR HIGHER ?*CHILLS OR SWEATING ?*NAUSEA AND VOMITING THAT IS NOT CONTROLLED WITH YOUR NAUSEA MEDICATION ?*UNUSUAL SHORTNESS OF BREATH ?*UNUSUAL BRUISING OR BLEEDING ?*URINARY PROBLEMS (pain or burning when urinating, or frequent urination) ?*BOWEL PROBLEMS (unusual diarrhea, constipation, pain near the anus) ?TENDERNESS IN MOUTH AND THROAT WITH OR WITHOUT PRESENCE OF ULCERS (sore throat, sores in mouth, or a toothache) ?UNUSUAL RASH, SWELLING OR PAIN  ?UNUSUAL VAGINAL DISCHARGE OR ITCHING  ? ?Items with * indicate a potential emergency and should be followed up as soon as possible or go to the Emergency Department if any problems should occur. ? ?Please show the CHEMOTHERAPY ALERT CARD or IMMUNOTHERAPY ALERT CARD at check-in  to the Emergency Department and triage nurse. ? ?Should you have questions after your visit or need to cancel or reschedule your appointment, please contact [facility]  Dept: [phone]  and follow the prompts.  Office hours are 8:00 a.m. to 4:30 p.m. Monday - Friday. Please note that voicemails left after 4:00 p.m. may not be returned until the following business day.  We are closed weekends and major holidays. You have access to a nurse at all times for urgent questions. Please call the main number to the clinic Dept: [phone] and follow the prompts. ? ? ?For any non-urgent questions, you may also contact your provider using MyChart. We now offer e-Visits for anyone 18 and older to request care online for non-urgent symptoms. For details visit [email or web address]. ?  ?Also download the MyChart app! Go to the app store, search "MyChart", open the app, select [facility], and log in with your MyChart username and password. ? ?Due to Covid, a mask is required upon entering the hospital/clinic. If you do not have a mask, one will be given to you upon arrival. For doctor visits, patients may have 1 support person aged 18 or older with them. For treatment visits, patients cannot have anyone with them due to current Covid guidelines and our immunocompromised population.  ? ?

## 2021-12-08 ENCOUNTER — Telehealth: Payer: Self-pay

## 2021-12-08 ENCOUNTER — Other Ambulatory Visit: Payer: Self-pay | Admitting: Hematology and Oncology

## 2021-12-08 DIAGNOSIS — G8929 Other chronic pain: Secondary | ICD-10-CM

## 2021-12-08 DIAGNOSIS — M7918 Myalgia, other site: Secondary | ICD-10-CM

## 2021-12-08 DIAGNOSIS — C9000 Multiple myeloma not having achieved remission: Secondary | ICD-10-CM

## 2021-12-08 MED ORDER — OXYCODONE HCL 5 MG PO TABS: 5.0000 mg | ORAL_TABLET | ORAL | 0 refills | Status: DC | PRN

## 2021-12-08 NOTE — Telephone Encounter (Signed)
-----   Message from Heath Lark, MD sent at 12/08/2021  7:58 AM EST ----- ?[facility] tends to send automatic refills ?Can you call and ask if he needs compazine refills before I refill it? ? ?

## 2021-12-08 NOTE — Telephone Encounter (Signed)
Called and left below message. Ask him to call the office back. ?

## 2021-12-08 NOTE — Telephone Encounter (Signed)
Called and told Oxycodone Rx sent to pharmacy and the office received his message that Compazine Rx not needed. He verbalized understanding. ?

## 2021-12-09 ENCOUNTER — Inpatient Hospital Stay: Payer: Medicare Other

## 2021-12-09 ENCOUNTER — Other Ambulatory Visit: Payer: Self-pay | Admitting: Hematology and Oncology

## 2021-12-09 ENCOUNTER — Other Ambulatory Visit: Payer: Self-pay

## 2021-12-09 VITALS — BP 124/77 | HR 70 | Temp 98.1°F | Resp 16

## 2021-12-09 DIAGNOSIS — Z5112 Encounter for antineoplastic immunotherapy: Secondary | ICD-10-CM | POA: Diagnosis not present

## 2021-12-09 DIAGNOSIS — C9000 Multiple myeloma not having achieved remission: Secondary | ICD-10-CM | POA: Diagnosis not present

## 2021-12-09 DIAGNOSIS — I2699 Other pulmonary embolism without acute cor pulmonale: Secondary | ICD-10-CM

## 2021-12-09 DIAGNOSIS — D696 Thrombocytopenia, unspecified: Secondary | ICD-10-CM | POA: Diagnosis not present

## 2021-12-09 DIAGNOSIS — D61818 Other pancytopenia: Secondary | ICD-10-CM

## 2021-12-09 LAB — CBC WITH DIFFERENTIAL (CANCER CENTER ONLY)
Abs Immature Granulocytes: 0 10*3/uL (ref 0.00–0.07)
Basophils Absolute: 0 10*3/uL (ref 0.0–0.1)
Basophils Relative: 0 %
Eosinophils Absolute: 0.1 10*3/uL (ref 0.0–0.5)
Eosinophils Relative: 2 %
HCT: 39 % (ref 39.0–52.0)
Hemoglobin: 13.3 g/dL (ref 13.0–17.0)
Immature Granulocytes: 0 %
Lymphocytes Relative: 21 %
Lymphs Abs: 0.9 10*3/uL (ref 0.7–4.0)
MCH: 36 pg — ABNORMAL HIGH (ref 26.0–34.0)
MCHC: 34.1 g/dL (ref 30.0–36.0)
MCV: 105.7 fL — ABNORMAL HIGH (ref 80.0–100.0)
Monocytes Absolute: 0.9 10*3/uL (ref 0.1–1.0)
Monocytes Relative: 20 %
Neutro Abs: 2.5 10*3/uL (ref 1.7–7.7)
Neutrophils Relative %: 57 %
Platelet Count: 115 10*3/uL — ABNORMAL LOW (ref 150–400)
RBC: 3.69 MIL/uL — ABNORMAL LOW (ref 4.22–5.81)
RDW: 13 % (ref 11.5–15.5)
WBC Count: 4.3 10*3/uL (ref 4.0–10.5)
nRBC: 0 % (ref 0.0–0.2)

## 2021-12-09 LAB — CMP (CANCER CENTER ONLY)
ALT: 28 U/L (ref 0–44)
AST: 17 U/L (ref 15–41)
Albumin: 3.9 g/dL (ref 3.5–5.0)
Alkaline Phosphatase: 54 U/L (ref 38–126)
Anion gap: 4 — ABNORMAL LOW (ref 5–15)
BUN: 22 mg/dL (ref 8–23)
CO2: 30 mmol/L (ref 22–32)
Calcium: 8.8 mg/dL — ABNORMAL LOW (ref 8.9–10.3)
Chloride: 102 mmol/L (ref 98–111)
Creatinine: 0.77 mg/dL (ref 0.61–1.24)
GFR, Estimated: >60 mL/min (ref >60)
Glucose, Bld: 89 mg/dL (ref 70–99)
Potassium: 3.9 mmol/L (ref 3.5–5.1)
Sodium: 136 mmol/L (ref 135–145)
Total Bilirubin: 0.7 mg/dL (ref 0.3–1.2)
Total Protein: 6.2 g/dL — ABNORMAL LOW (ref 6.5–8.1)

## 2021-12-09 LAB — FERRITIN: Ferritin: 392 ng/mL — ABNORMAL HIGH (ref 24–336)

## 2021-12-09 MED ORDER — ACETAMINOPHEN 325 MG PO TABS
650.0000 mg | ORAL_TABLET | Freq: Once | ORAL | Status: AC
  Administered 2021-12-09: 650 mg via ORAL
  Filled 2021-12-09: qty 2

## 2021-12-09 MED ORDER — DIPHENHYDRAMINE HCL 25 MG PO CAPS
25.0000 mg | ORAL_CAPSULE | Freq: Once | ORAL | Status: AC
  Administered 2021-12-09: 25 mg via ORAL
  Filled 2021-12-09: qty 1

## 2021-12-09 MED ORDER — DARATUMUMAB-HYALURONIDASE-FIHJ 1800-30000 MG-UT/15ML ~~LOC~~ SOLN
1800.0000 mg | Freq: Once | SUBCUTANEOUS | Status: AC
  Administered 2021-12-09: 1800 mg via SUBCUTANEOUS
  Filled 2021-12-09: qty 15

## 2021-12-09 MED ORDER — DEXAMETHASONE 4 MG PO TABS
20.0000 mg | ORAL_TABLET | Freq: Once | ORAL | Status: AC
  Administered 2021-12-09: 20 mg via ORAL
  Filled 2021-12-09: qty 5

## 2021-12-09 MED ORDER — MONTELUKAST SODIUM 10 MG PO TABS
10.0000 mg | ORAL_TABLET | Freq: Once | ORAL | Status: AC
  Administered 2021-12-09: 10 mg via ORAL
  Filled 2021-12-09: qty 1

## 2021-12-09 NOTE — Patient Instructions (Signed)
[facility]   Discharge Instructions: Thank you for choosing [facility] to provide your oncology and hematology care.   If you have a lab appointment with the Cancer Center, please go directly to the Cancer Center and check in at the registration area.   Wear comfortable clothing and clothing appropriate for easy access to any Portacath or PICC line.   We strive to give you quality time with your provider. You may need to reschedule your appointment if you arrive late (15 or more minutes).  Arriving late affects you and other patients whose appointments are after yours.  Also, if you miss three or more appointments without notifying the office, you may be dismissed from the clinic at the provider's discretion.      For prescription refill requests, have your pharmacy contact our office and allow 72 hours for refills to be completed.    Today you received the following chemotherapy and/or immunotherapy agents: daratumumab-hyaluronidase-fihj      To help prevent nausea and vomiting after your treatment, we encourage you to take your nausea medication as directed.  BELOW ARE SYMPTOMS THAT SHOULD BE REPORTED IMMEDIATELY: *FEVER GREATER THAN 100.4 F (38 C) OR HIGHER *CHILLS OR SWEATING *NAUSEA AND VOMITING THAT IS NOT CONTROLLED WITH YOUR NAUSEA MEDICATION *UNUSUAL SHORTNESS OF BREATH *UNUSUAL BRUISING OR BLEEDING *URINARY PROBLEMS (pain or burning when urinating, or frequent urination) *BOWEL PROBLEMS (unusual diarrhea, constipation, pain near the anus) TENDERNESS IN MOUTH AND THROAT WITH OR WITHOUT PRESENCE OF ULCERS (sore throat, sores in mouth, or a toothache) UNUSUAL RASH, SWELLING OR PAIN  UNUSUAL VAGINAL DISCHARGE OR ITCHING   Items with * indicate a potential emergency and should be followed up as soon as possible or go to the Emergency Department if any problems should occur.  Please show the CHEMOTHERAPY ALERT CARD or IMMUNOTHERAPY  ALERT CARD at check-in to the Emergency Department and triage nurse.  Should you have questions after your visit or need to cancel or reschedule your appointment, please contact [facility]  Dept: [phone]  and follow the prompts.  Office hours are 8:00 a.m. to 4:30 p.m. Monday - Friday. Please note that voicemails left after 4:00 p.m. may not be returned until the following business day.  We are closed weekends and major holidays. You have access to a nurse at all times for urgent questions. Please call the main number to the clinic Dept: [phone] and follow the prompts.   For any non-urgent questions, you may also contact your provider using MyChart. We now offer e-Visits for anyone 18 and older to request care online for non-urgent symptoms. For details visit [email or web address].   Also download the MyChart app! Go to the app store, search "MyChart", open the app, select , and log in with your MyChart username and password.  Due to Covid, a mask is required upon entering the hospital/clinic. If you do not have a mask, one will be given to you upon arrival. For doctor visits, patients may have 1 support person aged 18 or older with them. For treatment visits, patients cannot have anyone with them due to current Covid guidelines and our immunocompromised population.  

## 2021-12-10 LAB — KAPPA/LAMBDA LIGHT CHAINS
Kappa free light chain: 9.7 mg/L (ref 3.3–19.4)
Kappa, lambda light chain ratio: 2.85 — ABNORMAL HIGH (ref 0.26–1.65)
Lambda free light chains: 3.4 mg/L — ABNORMAL LOW (ref 5.7–26.3)

## 2021-12-12 ENCOUNTER — Ambulatory Visit: Payer: Medicare Other | Admitting: Hematology and Oncology

## 2021-12-12 ENCOUNTER — Other Ambulatory Visit: Payer: Medicare Other

## 2021-12-12 LAB — MULTIPLE MYELOMA PANEL, SERUM
Albumin SerPl Elph-Mcnc: 3.4 g/dL (ref 2.9–4.4)
Albumin/Glob SerPl: 1.4 (ref 0.7–1.7)
Alpha 1: 0.3 g/dL (ref 0.0–0.4)
Alpha2 Glob SerPl Elph-Mcnc: 0.8 g/dL (ref 0.4–1.0)
B-Globulin SerPl Elph-Mcnc: 0.8 g/dL (ref 0.7–1.3)
Gamma Glob SerPl Elph-Mcnc: 0.7 g/dL (ref 0.4–1.8)
Globulin, Total: 2.5 g/dL (ref 2.2–3.9)
IgA: 29 mg/dL — ABNORMAL LOW (ref 61–437)
IgG (Immunoglobin G), Serum: 770 mg/dL (ref 603–1613)
IgM (Immunoglobulin M), Srm: 15 mg/dL — ABNORMAL LOW (ref 20–172)
M Protein SerPl Elph-Mcnc: 0.2 g/dL — ABNORMAL HIGH
Total Protein ELP: 5.9 g/dL — ABNORMAL LOW (ref 6.0–8.5)

## 2021-12-13 DIAGNOSIS — Z20822 Contact with and (suspected) exposure to covid-19: Secondary | ICD-10-CM | POA: Diagnosis not present

## 2021-12-14 ENCOUNTER — Other Ambulatory Visit: Payer: Self-pay | Admitting: Family Medicine

## 2021-12-16 ENCOUNTER — Inpatient Hospital Stay: Payer: Medicare Other

## 2021-12-16 ENCOUNTER — Other Ambulatory Visit: Payer: Self-pay

## 2021-12-16 VITALS — BP 146/88 | HR 78 | Temp 98.0°F | Resp 16 | Wt 234.8 lb

## 2021-12-16 DIAGNOSIS — C9000 Multiple myeloma not having achieved remission: Secondary | ICD-10-CM

## 2021-12-16 DIAGNOSIS — Z5112 Encounter for antineoplastic immunotherapy: Secondary | ICD-10-CM | POA: Diagnosis not present

## 2021-12-16 DIAGNOSIS — D696 Thrombocytopenia, unspecified: Secondary | ICD-10-CM | POA: Diagnosis not present

## 2021-12-16 DIAGNOSIS — D61818 Other pancytopenia: Secondary | ICD-10-CM | POA: Diagnosis not present

## 2021-12-16 LAB — CMP (CANCER CENTER ONLY)
ALT: 38 U/L (ref 0–44)
AST: 18 U/L (ref 15–41)
Albumin: 3.9 g/dL (ref 3.5–5.0)
Alkaline Phosphatase: 65 U/L (ref 38–126)
Anion gap: 6 (ref 5–15)
BUN: 27 mg/dL — ABNORMAL HIGH (ref 8–23)
CO2: 30 mmol/L (ref 22–32)
Calcium: 9.1 mg/dL (ref 8.9–10.3)
Chloride: 104 mmol/L (ref 98–111)
Creatinine: 0.87 mg/dL (ref 0.61–1.24)
GFR, Estimated: >60 mL/min (ref >60)
Glucose, Bld: 84 mg/dL (ref 70–99)
Potassium: 4.1 mmol/L (ref 3.5–5.1)
Sodium: 140 mmol/L (ref 135–145)
Total Bilirubin: 0.8 mg/dL (ref 0.3–1.2)
Total Protein: 6.2 g/dL — ABNORMAL LOW (ref 6.5–8.1)

## 2021-12-16 LAB — CBC WITH DIFFERENTIAL (CANCER CENTER ONLY)
Abs Immature Granulocytes: 0.01 10*3/uL (ref 0.00–0.07)
Basophils Absolute: 0 10*3/uL (ref 0.0–0.1)
Basophils Relative: 0 %
Eosinophils Absolute: 0.1 10*3/uL (ref 0.0–0.5)
Eosinophils Relative: 2 %
HCT: 40.8 % (ref 39.0–52.0)
Hemoglobin: 13.9 g/dL (ref 13.0–17.0)
Immature Granulocytes: 0 %
Lymphocytes Relative: 23 %
Lymphs Abs: 0.9 10*3/uL (ref 0.7–4.0)
MCH: 36.2 pg — ABNORMAL HIGH (ref 26.0–34.0)
MCHC: 34.1 g/dL (ref 30.0–36.0)
MCV: 106.3 fL — ABNORMAL HIGH (ref 80.0–100.0)
Monocytes Absolute: 0.8 10*3/uL (ref 0.1–1.0)
Monocytes Relative: 20 %
Neutro Abs: 2.1 10*3/uL (ref 1.7–7.7)
Neutrophils Relative %: 55 %
Platelet Count: 132 10*3/uL — ABNORMAL LOW (ref 150–400)
RBC: 3.84 MIL/uL — ABNORMAL LOW (ref 4.22–5.81)
RDW: 13.2 % (ref 11.5–15.5)
WBC Count: 3.8 10*3/uL — ABNORMAL LOW (ref 4.0–10.5)
nRBC: 0 % (ref 0.0–0.2)

## 2021-12-16 MED ORDER — DEXAMETHASONE 4 MG PO TABS
20.0000 mg | ORAL_TABLET | Freq: Once | ORAL | Status: AC
  Administered 2021-12-16: 20 mg via ORAL
  Filled 2021-12-16: qty 5

## 2021-12-16 MED ORDER — SODIUM CHLORIDE 0.9 % IV SOLN: Freq: Once | INTRAVENOUS | Status: AC

## 2021-12-16 MED ORDER — ACETAMINOPHEN 325 MG PO TABS
650.0000 mg | ORAL_TABLET | Freq: Once | ORAL | Status: AC
  Administered 2021-12-16: 650 mg via ORAL
  Filled 2021-12-16: qty 2

## 2021-12-16 MED ORDER — BORTEZOMIB CHEMO SQ INJECTION 3.5 MG (2.5MG/ML)
1.0400 mg/m2 | Freq: Once | INTRAMUSCULAR | Status: AC
  Administered 2021-12-16: 2.25 mg via SUBCUTANEOUS
  Filled 2021-12-16: qty 0.9

## 2021-12-16 MED ORDER — DIPHENHYDRAMINE HCL 25 MG PO CAPS
25.0000 mg | ORAL_CAPSULE | Freq: Once | ORAL | Status: AC
  Administered 2021-12-16: 25 mg via ORAL
  Filled 2021-12-16: qty 1

## 2021-12-16 MED ORDER — DARATUMUMAB-HYALURONIDASE-FIHJ 1800-30000 MG-UT/15ML ~~LOC~~ SOLN
1800.0000 mg | Freq: Once | SUBCUTANEOUS | Status: AC
  Administered 2021-12-16: 1800 mg via SUBCUTANEOUS
  Filled 2021-12-16: qty 15

## 2021-12-16 MED ORDER — ZOLEDRONIC ACID 4 MG/100ML IV SOLN
4.0000 mg | Freq: Once | INTRAVENOUS | Status: AC
  Administered 2021-12-16: 4 mg via INTRAVENOUS
  Filled 2021-12-16: qty 100

## 2021-12-16 NOTE — Patient Instructions (Signed)
[facility]  Discharge Instructions: ?Thank you for choosing [facility] to provide your oncology and hematology care.  ? ?If you have a lab appointment with the [facility], please go directly to the [facility] and check in at the registration area. ?  ?Wear comfortable clothing and clothing appropriate for easy access to any Portacath or PICC line.  ? ?We strive to give you quality time with your provider. You may need to reschedule your appointment if you arrive late (15 or more minutes).  Arriving late affects you and other patients whose appointments are after yours.  Also, if you miss three or more appointments without notifying the office, you may be dismissed from the clinic at the provider?s discretion.    ?  ?For prescription refill requests, have your pharmacy contact our office and allow 72 hours for refills to be completed.   ? ?Today you received the following chemotherapy and/or immunotherapy agents: Velcade & Darzalex Faspro  ?  ?To help prevent nausea and vomiting after your treatment, we encourage you to take your nausea medication as directed. ? ?BELOW ARE SYMPTOMS THAT SHOULD BE REPORTED IMMEDIATELY: ?*FEVER GREATER THAN 100.4 F (38 ?C) OR HIGHER ?*CHILLS OR SWEATING ?*NAUSEA AND VOMITING THAT IS NOT CONTROLLED WITH YOUR NAUSEA MEDICATION ?*UNUSUAL SHORTNESS OF BREATH ?*UNUSUAL BRUISING OR BLEEDING ?*URINARY PROBLEMS (pain or burning when urinating, or frequent urination) ?*BOWEL PROBLEMS (unusual diarrhea, constipation, pain near the anus) ?TENDERNESS IN MOUTH AND THROAT WITH OR WITHOUT PRESENCE OF ULCERS (sore throat, sores in mouth, or a toothache) ?UNUSUAL RASH, SWELLING OR PAIN  ?UNUSUAL VAGINAL DISCHARGE OR ITCHING  ? ?Items with * indicate a potential emergency and should be followed up as soon as possible or go to the Emergency Department if any problems should occur. ? ?Please show the CHEMOTHERAPY ALERT CARD or IMMUNOTHERAPY ALERT CARD  at check-in to the Emergency Department and triage nurse. ? ?Should you have questions after your visit or need to cancel or reschedule your appointment, please contact [facility]  Dept: [phone]  and follow the prompts.  Office hours are 8:00 a.m. to 4:30 p.m. Monday - Friday. Please note that voicemails left after 4:00 p.m. may not be returned until the following business day.  We are closed weekends and major holidays. You have access to a nurse at all times for urgent questions. Please call the main number to the clinic Dept: [phone] and follow the prompts. ? ? ?For any non-urgent questions, you may also contact your provider using MyChart. We now offer e-Visits for anyone 76 and older to request care online for non-urgent symptoms. For details visit [email or web address]. ?  ?Also download the MyChart app! Go to the app store, search "MyChart", open the app, select [facility], and log in with your MyChart username and password. ? ?Due to Covid, a mask is required upon entering the hospital/clinic. If you do not have a mask, one will be given to you upon arrival. For doctor visits, patients may have 1 support person aged 83 or older with them. For treatment visits, patients cannot have anyone with them due to current Covid guidelines and our immunocompromised population.  ? ?

## 2021-12-19 ENCOUNTER — Inpatient Hospital Stay: Payer: Medicare Other

## 2021-12-19 ENCOUNTER — Other Ambulatory Visit: Payer: Self-pay

## 2021-12-19 VITALS — BP 125/79 | HR 76 | Temp 98.1°F | Resp 18 | Wt 234.0 lb

## 2021-12-19 DIAGNOSIS — C9000 Multiple myeloma not having achieved remission: Secondary | ICD-10-CM | POA: Diagnosis not present

## 2021-12-19 DIAGNOSIS — Z5112 Encounter for antineoplastic immunotherapy: Secondary | ICD-10-CM | POA: Diagnosis not present

## 2021-12-19 DIAGNOSIS — D61818 Other pancytopenia: Secondary | ICD-10-CM | POA: Diagnosis not present

## 2021-12-19 DIAGNOSIS — D696 Thrombocytopenia, unspecified: Secondary | ICD-10-CM | POA: Diagnosis not present

## 2021-12-19 MED ORDER — BORTEZOMIB CHEMO SQ INJECTION 3.5 MG (2.5MG/ML)
1.0400 mg/m2 | Freq: Once | INTRAMUSCULAR | Status: AC
  Administered 2021-12-19: 2.25 mg via SUBCUTANEOUS
  Filled 2021-12-19: qty 0.9

## 2021-12-19 NOTE — Patient Instructions (Signed)
[facility]   ?Discharge Instructions: ?Thank you for choosing [facility] to provide your oncology and hematology care.  ? ?If you have a lab appointment with the Cancer Center, please go directly to the Cancer Center and check in at the registration area. ?  ?Wear comfortable clothing and clothing appropriate for easy access to any Portacath or PICC line.  ? ?We strive to give you quality time with your provider. You may need to reschedule your appointment if you arrive late (15 or more minutes).  Arriving late affects you and other patients whose appointments are after yours.  Also, if you miss three or more appointments without notifying the office, you may be dismissed from the clinic at the provider?s discretion.    ?  ?For prescription refill requests, have your pharmacy contact our office and allow 72 hours for refills to be completed.   ? ?Today you received the following chemotherapy and/or immunotherapy agents: bortezomib    ?  ?To help prevent nausea and vomiting after your treatment, we encourage you to take your nausea medication as directed. ? ?BELOW ARE SYMPTOMS THAT SHOULD BE REPORTED IMMEDIATELY: ?*FEVER GREATER THAN 100.4 F (38 ?C) OR HIGHER ?*CHILLS OR SWEATING ?*NAUSEA AND VOMITING THAT IS NOT CONTROLLED WITH YOUR NAUSEA MEDICATION ?*UNUSUAL SHORTNESS OF BREATH ?*UNUSUAL BRUISING OR BLEEDING ?*URINARY PROBLEMS (pain or burning when urinating, or frequent urination) ?*BOWEL PROBLEMS (unusual diarrhea, constipation, pain near the anus) ?TENDERNESS IN MOUTH AND THROAT WITH OR WITHOUT PRESENCE OF ULCERS (sore throat, sores in mouth, or a toothache) ?UNUSUAL RASH, SWELLING OR PAIN  ?UNUSUAL VAGINAL DISCHARGE OR ITCHING  ? ?Items with * indicate a potential emergency and should be followed up as soon as possible or go to the Emergency Department if any problems should occur. ? ?Please show the CHEMOTHERAPY ALERT CARD or IMMUNOTHERAPY ALERT CARD at check-in  to the Emergency Department and triage nurse. ? ?Should you have questions after your visit or need to cancel or reschedule your appointment, please contact [facility]  Dept: [phone]  and follow the prompts.  Office hours are 8:00 a.m. to 4:30 p.m. Monday - Friday. Please note that voicemails left after 4:00 p.m. may not be returned until the following business day.  We are closed weekends and major holidays. You have access to a nurse at all times for urgent questions. Please call the main number to the clinic Dept: [phone] and follow the prompts. ? ? ?For any non-urgent questions, you may also contact your provider using MyChart. We now offer e-Visits for anyone 18 and older to request care online for non-urgent symptoms. For details visit [email or web address]. ?  ?Also download the MyChart app! Go to the app store, search "MyChart", open the app, select [facility], and log in with your MyChart username and password. ? ?Due to Covid, a mask is required upon entering the hospital/clinic. If you do not have a mask, one will be given to you upon arrival. For doctor visits, patients may have 1 support person aged 18 or older with them. For treatment visits, patients cannot have anyone with them due to current Covid guidelines and our immunocompromised population.  ? ?

## 2021-12-23 ENCOUNTER — Inpatient Hospital Stay (HOSPITAL_BASED_OUTPATIENT_CLINIC_OR_DEPARTMENT_OTHER): Payer: Medicare Other | Admitting: Hematology and Oncology

## 2021-12-23 ENCOUNTER — Inpatient Hospital Stay: Payer: Medicare Other

## 2021-12-23 ENCOUNTER — Encounter: Payer: Self-pay | Admitting: Hematology and Oncology

## 2021-12-23 ENCOUNTER — Other Ambulatory Visit: Payer: Self-pay

## 2021-12-23 DIAGNOSIS — D696 Thrombocytopenia, unspecified: Secondary | ICD-10-CM | POA: Diagnosis not present

## 2021-12-23 DIAGNOSIS — C9000 Multiple myeloma not having achieved remission: Secondary | ICD-10-CM | POA: Diagnosis not present

## 2021-12-23 DIAGNOSIS — M7918 Myalgia, other site: Secondary | ICD-10-CM | POA: Diagnosis not present

## 2021-12-23 DIAGNOSIS — G8929 Other chronic pain: Secondary | ICD-10-CM

## 2021-12-23 DIAGNOSIS — Z5112 Encounter for antineoplastic immunotherapy: Secondary | ICD-10-CM | POA: Diagnosis not present

## 2021-12-23 DIAGNOSIS — D61818 Other pancytopenia: Secondary | ICD-10-CM | POA: Diagnosis not present

## 2021-12-23 LAB — CBC WITH DIFFERENTIAL (CANCER CENTER ONLY)
Abs Immature Granulocytes: 0.01 10*3/uL (ref 0.00–0.07)
Basophils Absolute: 0 10*3/uL (ref 0.0–0.1)
Basophils Relative: 0 %
Eosinophils Absolute: 0 10*3/uL (ref 0.0–0.5)
Eosinophils Relative: 1 %
HCT: 38.9 % — ABNORMAL LOW (ref 39.0–52.0)
Hemoglobin: 13.1 g/dL (ref 13.0–17.0)
Immature Granulocytes: 0 %
Lymphocytes Relative: 27 %
Lymphs Abs: 1.2 10*3/uL (ref 0.7–4.0)
MCH: 35.8 pg — ABNORMAL HIGH (ref 26.0–34.0)
MCHC: 33.7 g/dL (ref 30.0–36.0)
MCV: 106.3 fL — ABNORMAL HIGH (ref 80.0–100.0)
Monocytes Absolute: 0.8 10*3/uL (ref 0.1–1.0)
Monocytes Relative: 17 %
Neutro Abs: 2.5 10*3/uL (ref 1.7–7.7)
Neutrophils Relative %: 55 %
Platelet Count: 102 10*3/uL — ABNORMAL LOW (ref 150–400)
RBC: 3.66 MIL/uL — ABNORMAL LOW (ref 4.22–5.81)
RDW: 12.9 % (ref 11.5–15.5)
WBC Count: 4.6 10*3/uL (ref 4.0–10.5)
nRBC: 0 % (ref 0.0–0.2)

## 2021-12-23 LAB — CMP (CANCER CENTER ONLY)
ALT: 32 U/L (ref 0–44)
AST: 18 U/L (ref 15–41)
Albumin: 3.7 g/dL (ref 3.5–5.0)
Alkaline Phosphatase: 57 U/L (ref 38–126)
Anion gap: 5 (ref 5–15)
BUN: 26 mg/dL — ABNORMAL HIGH (ref 8–23)
CO2: 30 mmol/L (ref 22–32)
Calcium: 8.5 mg/dL — ABNORMAL LOW (ref 8.9–10.3)
Chloride: 103 mmol/L (ref 98–111)
Creatinine: 0.86 mg/dL (ref 0.61–1.24)
GFR, Estimated: >60 mL/min (ref >60)
Glucose, Bld: 116 mg/dL — ABNORMAL HIGH (ref 70–99)
Potassium: 4.1 mmol/L (ref 3.5–5.1)
Sodium: 138 mmol/L (ref 135–145)
Total Bilirubin: 0.7 mg/dL (ref 0.3–1.2)
Total Protein: 6.2 g/dL — ABNORMAL LOW (ref 6.5–8.1)

## 2021-12-23 MED ORDER — ACETAMINOPHEN 325 MG PO TABS
650.0000 mg | ORAL_TABLET | Freq: Once | ORAL | Status: AC
  Administered 2021-12-23: 650 mg via ORAL
  Filled 2021-12-23: qty 2

## 2021-12-23 MED ORDER — DARATUMUMAB-HYALURONIDASE-FIHJ 1800-30000 MG-UT/15ML ~~LOC~~ SOLN
1800.0000 mg | Freq: Once | SUBCUTANEOUS | Status: AC
  Administered 2021-12-23: 1800 mg via SUBCUTANEOUS
  Filled 2021-12-23: qty 15

## 2021-12-23 MED ORDER — DEXAMETHASONE 4 MG PO TABS
20.0000 mg | ORAL_TABLET | Freq: Once | ORAL | Status: AC
  Administered 2021-12-23: 20 mg via ORAL
  Filled 2021-12-23: qty 5

## 2021-12-23 MED ORDER — DIPHENHYDRAMINE HCL 25 MG PO CAPS
25.0000 mg | ORAL_CAPSULE | Freq: Once | ORAL | Status: AC
  Administered 2021-12-23: 25 mg via ORAL
  Filled 2021-12-23: qty 1

## 2021-12-23 MED ORDER — BORTEZOMIB CHEMO SQ INJECTION 3.5 MG (2.5MG/ML)
1.0400 mg/m2 | Freq: Once | INTRAMUSCULAR | Status: AC
  Administered 2021-12-23: 2.25 mg via SUBCUTANEOUS
  Filled 2021-12-23: qty 0.9

## 2021-12-23 NOTE — Assessment & Plan Note (Signed)
Overall, he tolerated treatment very well except for mild pancytopenia and diffuse bone pain after Zometa ?His recent light chain studies showed improvement ?We will continue treatment as scheduled and myeloma panel once a month ?I plan to defer future Zometa treatment to every 6 months ?

## 2021-12-23 NOTE — Assessment & Plan Note (Signed)
This is likely due to recent treatment. The patient denies recent history of bleeding such as epistaxis, hematuria or hematochezia. He is asymptomatic from the low platelet count. I will observe for now.  

## 2021-12-23 NOTE — Progress Notes (Signed)
[facility] ?OFFICE PROGRESS NOTE ? ?Patient Care Team: ?Denita Lung, MD as PCP - General (Family Medicine) ?Heath Lark, MD as Consulting Physician (Hematology and Oncology) ? ?ASSESSMENT & PLAN:  ?Multiple myeloma without remission ([place]) ?Overall, he tolerated treatment very well except for mild pancytopenia and diffuse bone pain after Zometa ?His recent light chain studies showed improvement ?We will continue treatment as scheduled and myeloma panel once a month ?I plan to defer future Zometa treatment to every 6 months ? ?Chronic musculoskeletal pain ?He has persistent mild back pain, slightly worse due to recent Zometa infusion ?He will continue the same ? ?Thrombocytopenia ([place]) ?This is likely due to recent treatment. The patient denies recent history of bleeding such as epistaxis, hematuria or hematochezia. He is asymptomatic from the low platelet count. I will observe for now.   ? ?Hemochromatosis, hereditary ?His serum ferritin level is borderline high but he is not symptomatic ?I recommend deferring phlebotomy until he completes his chemotherapy ? ?No orders of the defined types were placed in this encounter. ? ? ?All questions were answered. The patient knows to call the clinic with any problems, questions or concerns. ?The total time spent in the appointment was 20 minutes encounter with patients including review of chart and various tests results, discussions about plan of care and coordination of care plan ?  ?Heath Lark, MD ?12/23/2021 1:21 PM ? ?INTERVAL HISTORY: ?Please see below for problem oriented charting. ?he returns for treatment follow-up on Velcade and daratumumab for recurrent myeloma ?He has diffuse bone aches after recent Zometa infusion ?Otherwise no neuropathy, bleeding or side effects from treatment ? ?REVIEW OF SYSTEMS:   ?Constitutional: Denies fevers, chills or abnormal weight loss ?Eyes: Denies blurriness of vision ?Ears, nose, mouth, throat, and face: Denies  mucositis or sore throat ?Respiratory: Denies cough, dyspnea or wheezes ?Cardiovascular: Denies palpitation, chest discomfort or lower extremity swelling ?Gastrointestinal:  Denies nausea, heartburn or change in bowel habits ?Skin: Denies abnormal skin rashes ?Lymphatics: Denies new lymphadenopathy or easy bruising ?Neurological:Denies numbness, tingling or new weaknesses ?Behavioral/Psych: Mood is stable, no new changes  ?All other systems were reviewed with the patient and are negative. ? ?I have reviewed the past medical history, past surgical history, social history and family history with the patient and they are unchanged from previous note. ? ?ALLERGIES:  has No Known Allergies. ? ?MEDICATIONS:  ?Current Outpatient Medications  ?Medication Sig Dispense Refill  ? acetaminophen (TYLENOL) 650 MG CR tablet Take 650 mg by mouth every 8 (eight) hours as needed for pain.    ? acyclovir (ZOVIRAX) 400 MG tablet Take 1 tablet (400 mg total) by mouth 2 (two) times daily. (Patient taking differently: Take 400 mg by mouth daily.) 180 tablet 11  ? atorvastatin (LIPITOR) 20 MG tablet Take 1 tablet (20 mg total) by mouth daily. 90 tablet 4  ? calcium carbonate (TUMS - DOSED IN MG ELEMENTAL CALCIUM) 500 MG chewable tablet Chew 1-2 tablets by mouth daily as needed for heartburn.    ? Cholecalciferol (VITAMIN D) 50 MCG (2000 UT) tablet Take 2,000 Units by mouth daily.    ? folic acid (FOLVITE) 570 MCG tablet Take 400 mcg by mouth daily.    ? losartan-hydrochlorothiazide (HYZAAR) 100-12.5 MG tablet TAKE 1 TABLET BY MOUTH DAILY 90 tablet 0  ? Multiple Vitamin (MULTI-VITAMIN) tablet Take 1 tablet by mouth daily.    ? ondansetron (ZOFRAN) 4 MG tablet Take 1 tablet (4 mg total) by mouth every 6 (six) hours  as needed for nausea. 20 tablet 0  ? ondansetron (ZOFRAN) 8 MG tablet Take 1 tablet (8 mg total) by mouth 2 (two) times daily as needed (Nausea or vomiting). 30 tablet 1  ? oxyCODONE (ROXICODONE) 5 MG immediate release tablet Take  1 tablet (5 mg total) by mouth every 4 (four) hours as needed for severe pain. 60 tablet 0  ? Polyethyl Glycol-Propyl Glycol (SYSTANE OP) Place 1 drop into both eyes daily as needed (dry eyes).    ? prochlorperazine (COMPAZINE) 10 MG tablet Take 1 tablet (10 mg total) by mouth every 6 (six) hours as needed (Nausea or vomiting). 30 tablet 1  ? rivaroxaban (XARELTO) 20 MG TABS tablet Take 1 tablet (20 mg total) by mouth daily with supper. 90 tablet 1  ? vitamin B-12 (CYANOCOBALAMIN) 1000 MCG tablet Take 1,000 mcg by mouth daily.    ? ?No current facility-administered medications for this visit.  ? ?Facility-Administered Medications Ordered in Other Visits  ?Medication Dose Route Frequency Provider Last Rate Last Admin  ? bortezomib SQ (VELCADE) chemo injection (2.52m/mL concentration) 2.25 mg  1.04 mg/m2 (Treatment Plan Recorded) Subcutaneous Once GAlvy Bimler Alysah Carton, MD      ? daratumumab-hyaluronidase-fihj (DARZALEX FASPRO) 1800-30000 MG-UT/15ML chemo SQ injection 1,800 mg  1,800 mg Subcutaneous Once GHeath Lark MD      ? ? ?SUMMARY OF ONCOLOGIC HISTORY: ?Oncology History  ?Multiple myeloma without remission ([place]  ?03/20/2019 Imaging  ? 1. Tumor involving the L5 and S1 and S2 segments of the spine as described with mass effects upon the left L4, L5, S1 and more distal left sacral nerves as described above. ?2. Does the patient have a history of malignancy? ?3. Multiple plasmacytomas and metastatic disease could give this appearance ?  ?03/27/2019 Pathology Results  ? Soft Tissue Needle Core Biopsy, sacral mass ?- PLASMABLASTIC NEOPLASM. ?- SEE COMMENT. ?Microscopic Comment ?The sections show needle core biopsy fragments of soft tissue densely infiltrated by a relatively monomorphic infiltrate of atypical plasmacytoid cells characterized by vesicular or partially clumped chromatin and prominent nucleoli. This is associated with scattered mitosis. A battery of immunohistochemical stains was performed and show that the  atypical plasmacytoid cells are positive for CD138, CD43 and cytoplasmic kappa. There is also weak positivity for CD56 and partial variable positivity for cyclin D1. The atypical plasmacytoid cells are negative for cytoplasmic lambda, CD10, PAX5, CD79a, CD20, CD3, CD5, CD34, EBV (ISH) and mostly negative for LCA. The overall morphologic and histologic features are most compatible with a plasmablastic neoplasm. The differential diagnosis includes plasmablastic lymphoma and plasmablastic plasmacytoma/myeloma. Based on the overall phenotypic features and the clinical setting, plasmacytoma/myeloma is favored. Clinical correlation and hematologic evaluation is recommended ?  ?03/27/2019 Procedure  ? Technically successful CT-guided biopsy of sacral soft tissue mass. ?  ?04/04/2019 Initial Diagnosis  ? Multiple myeloma without remission ([place] ?  ?04/11/2019 PET scan  ? IMPRESSION: ?1. Previously noted expansile lesions involving the L5 vertebra and sacrum are again noted and exhibit intense FDG uptake compatible with metabolically active disease. A third focus of increased uptake ?without corresponding CT abnormality is noted within the intertrochanteric portions of the proximal right femur. ?2. Small lucent lesion within the T3 vertebra is noted without corresponding FDG uptake. ?3. Asymmetric left bladder wall thickening, etiology indeterminate. ?4. Aortic Atherosclerosis (ICD10-I70.0). Lad coronary artery calcification. ?  ?04/12/2019 Cancer Staging  ? Staging form: Plasma Cell Myeloma and Plasma Cell Disorders, AJCC 8th Edition ?- Clinical stage from 04/12/2019: Beta-2-microglobulin (mg/L): 2, Albumin (g/dL): 3.6, ISS:  Stage I, High-risk cytogenetics: Unknown, LDH: Unknown - Signed by Heath Lark, MD on 04/12/2019 ? ?  ?04/14/2019 Bone Marrow Biopsy  ? Bone Marrow, Aspirate,Biopsy, and Clot, right iliac bone ?- PLASMA CELL MYELOMA. ?  ?04/17/2019 - 07/11/2019 Chemotherapy  ? The patient had bortezemib, revlimid and dexamethasone  for chemotherapy treatment.  ? ?  ?08/17/2019 Bone Marrow Transplant  ? He received high dose melphalan followed by autologous stem cell transplant at [facility] ?  ?11/22/2019 Bone Marrow Biopsy  ? BONE MARROW

## 2021-12-23 NOTE — Assessment & Plan Note (Signed)
His serum ferritin level is borderline high but he is not symptomatic ?I recommend deferring phlebotomy until he completes his chemotherapy ?

## 2021-12-23 NOTE — Assessment & Plan Note (Signed)
He has persistent mild back pain, slightly worse due to recent Zometa infusion ?He will continue the same ?

## 2021-12-24 ENCOUNTER — Telehealth: Payer: Self-pay | Admitting: Family Medicine

## 2021-12-24 NOTE — Telephone Encounter (Signed)
Left message for patient to call back and schedule Medicare Annual Wellness Visit (AWV) either virtually or in office. ?I left my number for patient to call [phone]. ? ?Last AWV ;10/11/2018 ? please schedule at anytime with health coach ? ? ?

## 2021-12-26 ENCOUNTER — Other Ambulatory Visit: Payer: Self-pay

## 2021-12-26 ENCOUNTER — Inpatient Hospital Stay: Payer: Medicare Other

## 2021-12-26 ENCOUNTER — Ambulatory Visit (INDEPENDENT_AMBULATORY_CARE_PROVIDER_SITE_OTHER): Payer: Medicare Other

## 2021-12-26 VITALS — BP 139/79 | HR 93 | Temp 98.3°F | Resp 18 | Wt 236.8 lb

## 2021-12-26 VITALS — Ht 69.0 in | Wt 236.0 lb

## 2021-12-26 DIAGNOSIS — D61818 Other pancytopenia: Secondary | ICD-10-CM | POA: Diagnosis not present

## 2021-12-26 DIAGNOSIS — C9000 Multiple myeloma not having achieved remission: Secondary | ICD-10-CM

## 2021-12-26 DIAGNOSIS — Z0000 Encounter for general adult medical examination without abnormal findings: Secondary | ICD-10-CM

## 2021-12-26 DIAGNOSIS — Z5112 Encounter for antineoplastic immunotherapy: Secondary | ICD-10-CM | POA: Diagnosis not present

## 2021-12-26 DIAGNOSIS — D696 Thrombocytopenia, unspecified: Secondary | ICD-10-CM | POA: Diagnosis not present

## 2021-12-26 MED ORDER — BORTEZOMIB CHEMO SQ INJECTION 3.5 MG (2.5MG/ML)
1.0400 mg/m2 | Freq: Once | INTRAMUSCULAR | Status: AC
  Administered 2021-12-26: 2.25 mg via SUBCUTANEOUS
  Filled 2021-12-26: qty 0.9

## 2021-12-26 NOTE — Patient Instructions (Signed)
Mr. Hiller , ?Thank you for taking time to come for your Medicare Wellness Visit. I appreciate your ongoing commitment to your health goals. Please review the following plan we discussed and let me know if I can assist you in the future.  ? ?Screening recommendations/referrals: ?Colonoscopy: completed 10/29/2014, due 10/29/2024 ?Recommended yearly ophthalmology/optometry visit for glaucoma screening and checkup ?Recommended yearly dental visit for hygiene and checkup ? ?Vaccinations: ?Influenza vaccine: completed 07/16/2021, due next flu season ?Pneumococcal vaccine: completed 11/28/2020 ?Tdap vaccine: completed 10/28/2012, due 10/28/2022 ?Shingles vaccine: completed   ?Covid-19:  01/02/2020, 12/12/2019 ? ?Advanced directives: Please bring a copy of your POA (Power of Attorney) and/or Living Will to your next appointment.  ? ?Conditions/risks identified: none ? ?Next appointment: Follow up in one year for your annual wellness visit.  ? ?Preventive Care 71 Years and Older, Male ?Preventive care refers to lifestyle choices and visits with your health care provider that can promote health and wellness. ?What does preventive care include? ?A yearly physical exam. This is also called an annual well check. ?Dental exams once or twice a year. ?Routine eye exams. Ask your health care provider how often you should have your eyes checked. ?Personal lifestyle choices, including: ?Daily care of your teeth and gums. ?Regular physical activity. ?Eating a healthy diet. ?Avoiding tobacco and drug use. ?Limiting alcohol use. ?Practicing safe sex. ?Taking low doses of aspirin every day. ?Taking vitamin and mineral supplements as recommended by your health care provider. ?What happens during an annual well check? ?The services and screenings done by your health care provider during your annual well check will depend on your age, overall health, lifestyle risk factors, and family history of disease. ?Counseling  ?Your health care provider may  ask you questions about your: ?Alcohol use. ?Tobacco use. ?Drug use. ?Emotional well-being. ?Home and relationship well-being. ?Sexual activity. ?Eating habits. ?History of falls. ?Memory and ability to understand (cognition). ?Work and work Statistician. ?Screening  ?You may have the following tests or measurements: ?Height, weight, and BMI. ?Blood pressure. ?Lipid and cholesterol levels. These may be checked every 5 years, or more frequently if you are over 30 years old. ?Skin check. ?Lung cancer screening. You may have this screening every year starting at age 40 if you have a 30-pack-year history of smoking and currently smoke or have quit within the past 15 years. ?Fecal occult blood test (FOBT) of the stool. You may have this test every year starting at age 4. ?Flexible sigmoidoscopy or colonoscopy. You may have a sigmoidoscopy every 5 years or a colonoscopy every 10 years starting at age 100. ?Prostate cancer screening. Recommendations will vary depending on your family history and other risks. ?Hepatitis C blood test. ?Hepatitis B blood test. ?Sexually transmitted disease (STD) testing. ?Diabetes screening. This is done by checking your blood sugar (glucose) after you have not eaten for a while (fasting). You may have this done every 1-3 years. ?Abdominal aortic aneurysm (AAA) screening. You may need this if you are a current or former smoker. ?Osteoporosis. You may be screened starting at age 20 if you are at high risk. ?Talk with your health care provider about your test results, treatment options, and if necessary, the need for more tests. ?Vaccines  ?Your health care provider may recommend certain vaccines, such as: ?Influenza vaccine. This is recommended every year. ?Tetanus, diphtheria, and acellular pertussis (Tdap, Td) vaccine. You may need a Td booster every 10 years. ?Zoster vaccine. You may need this after age 24. ?Pneumococcal 13-valent conjugate (PCV13)  vaccine. One dose is recommended after age  33. ?Pneumococcal polysaccharide (PPSV23) vaccine. One dose is recommended after age 56. ?Talk to your health care provider about which screenings and vaccines you need and how often you need them. ?This information is not intended to replace advice given to you by your health care provider. Make sure you discuss any questions you have with your health care provider. ?Document Released: 10/18/2015 Document Revised: 06/10/2016 Document Reviewed: 07/23/2015 ?Elsevier Interactive Patient Education ? 2017 [employer]. ? ?Fall Prevention in the Home ?Falls can cause injuries. They can happen to people of all ages. There are many things you can do to make your home safe and to help prevent falls. ?What can I do on the outside of my home? ?Regularly fix the edges of walkways and driveways and fix any cracks. ?Remove anything that might make you trip as you walk through a door, such as a raised step or threshold. ?Trim any bushes or trees on the path to your home. ?Use bright outdoor lighting. ?Clear any walking paths of anything that might make someone trip, such as rocks or tools. ?Regularly check to see if handrails are loose or broken. Make sure that both sides of any steps have handrails. ?Any raised decks and porches should have guardrails on the edges. ?Have any leaves, snow, or ice cleared regularly. ?Use sand or salt on walking paths during winter. ?Clean up any spills in your garage right away. This includes oil or grease spills. ?What can I do in the bathroom? ?Use night lights. ?Install grab bars by the toilet and in the tub and shower. Do not use towel bars as grab bars. ?Use non-skid mats or decals in the tub or shower. ?If you need to sit down in the shower, use a plastic, non-slip stool. ?Keep the floor dry. Clean up any water that spills on the floor as soon as it happens. ?Remove soap buildup in the tub or shower regularly. ?Attach bath mats securely with double-sided non-slip rug tape. ?Do not have throw  rugs and other things on the floor that can make you trip. ?What can I do in the bedroom? ?Use night lights. ?Make sure that you have a light by your bed that is easy to reach. ?Do not use any sheets or blankets that are too big for your bed. They should not hang down onto the floor. ?Have a firm chair that has side arms. You can use this for support while you get dressed. ?Do not have throw rugs and other things on the floor that can make you trip. ?What can I do in the kitchen? ?Clean up any spills right away. ?Avoid walking on wet floors. ?Keep items that you use a lot in easy-to-reach places. ?If you need to reach something above you, use a strong step stool that has a grab bar. ?Keep electrical cords out of the way. ?Do not use floor polish or wax that makes floors slippery. If you must use wax, use non-skid floor wax. ?Do not have throw rugs and other things on the floor that can make you trip. ?What can I do with my stairs? ?Do not leave any items on the stairs. ?Make sure that there are handrails on both sides of the stairs and use them. Fix handrails that are broken or loose. Make sure that handrails are as long as the stairways. ?Check any carpeting to make sure that it is firmly attached to the stairs. Fix any carpet that is loose  or worn. ?Avoid having throw rugs at the top or bottom of the stairs. If you do have throw rugs, attach them to the floor with carpet tape. ?Make sure that you have a light switch at the top of the stairs and the bottom of the stairs. If you do not have them, ask someone to add them for you. ?What else can I do to help prevent falls? ?Wear shoes that: ?Do not have high heels. ?Have rubber bottoms. ?Are comfortable and fit you well. ?Are closed at the toe. Do not wear sandals. ?If you use a stepladder: ?Make sure that it is fully opened. Do not climb a closed stepladder. ?Make sure that both sides of the stepladder are locked into place. ?Ask someone to hold it for you, if  possible. ?Clearly mark and make sure that you can see: ?Any grab bars or handrails. ?First and last steps. ?Where the edge of each step is. ?Use tools that help you move around (mobility aids) if they are

## 2021-12-26 NOTE — Progress Notes (Signed)
?I connected with Vertell Novak today by telephone and verified that I am speaking with the correct person using two identifiers. ?Location patient: home ?Location provider: work ?Persons participating in the virtual visit: Vertell Novak, Glenna Durand LPN. ?  ?I discussed the limitations, risks, security and privacy concerns of performing an evaluation and management service by telephone and the availability of in person appointments. I also discussed with the patient that there may be a patient responsible charge related to this service. The patient expressed understanding and verbally consented to this telephonic visit.  ?  ?Interactive audio and video telecommunications were attempted between this provider and patient, however failed, due to patient having technical difficulties OR patient did not have access to video capability.  We continued and completed visit with audio only. ? ?  ? ?Vital signs may be patient reported or missing. ? ?Subjective:  ? Jerimey Burridge. is a 71 y.o. male who presents for Medicare Annual/Subsequent preventive examination. ? ?Review of Systems    ? ?Cardiac Risk Factors include: advanced age (>31mn, >>42women);male gender;obesity (BMI >30kg/m2) ? ?   ?Objective:  ?  ?Today's Vitals  ? 12/26/21 1436  ?Weight: 236 lb (107 kg)  ?Height: '5\' 9"'  (1.753 m)  ? ?Body mass index is 34.85 kg/m?. ? ? ?  12/26/2021  ?  2:41 PM 12/26/2021  ? 11:39 AM 12/02/2021  ? 11:18 AM 09/16/2021  ?  3:15 AM 07/15/2021  ?  1:29 PM 07/09/2021  ? 10:13 AM 02/08/2020  ? 10:22 AM  ?Advanced Directives  ?Does Patient Have a Medical Advance Directive? Yes No Yes No Yes Yes   ?Type of AParamedicof [place]Living will [place]Living will   [place]Living will [place] ValleyLiving will [place]elandLiving will  ?Does patient want to make changes to medical advance directive?  No - Patient declined No - Patient declined   No - Patient declined    ?Copy of [place]in Chart? No - copy requested    No - copy requested    ?Would patient like information on creating a medical advance directive?  No - Patient declined  No - Patient declined     ? ? ?Current Medications (verified) ?Outpatient Encounter Medications as of 12/26/2021  ?Medication Sig  ? acetaminophen (TYLENOL) 650 MG CR tablet Take 650 mg by mouth every 8 (eight) hours as needed for pain.  ? acyclovir (ZOVIRAX) 400 MG tablet Take 1 tablet (400 mg total) by mouth 2 (two) times daily. (Patient taking differently: Take 400 mg by mouth daily.)  ? atorvastatin (LIPITOR) 20 MG tablet Take 1 tablet (20 mg total) by mouth daily.  ? calcium carbonate (TUMS - DOSED IN MG ELEMENTAL CALCIUM) 500 MG chewable tablet Chew 1-2 tablets by mouth daily as needed for heartburn.  ? Cholecalciferol (VITAMIN D) 50 MCG (2000 UT) tablet Take 2,000 Units by mouth daily.  ? folic acid (FOLVITE) 4811MCG tablet Take 400 mcg by mouth daily.  ? losartan-hydrochlorothiazide (HYZAAR) 100-12.5 MG tablet TAKE 1 TABLET BY MOUTH DAILY  ? Multiple Vitamin (MULTI-VITAMIN) tablet Take 1 tablet by mouth daily.  ? ondansetron (ZOFRAN) 4 MG tablet Take 1 tablet (4 mg total) by mouth every 6 (six) hours as needed for nausea.  ? ondansetron (ZOFRAN) 8 MG tablet Take 1 tablet (8 mg total) by mouth 2 (two) times daily as needed (Nausea or vomiting).  ? oxyCODONE (ROXICODONE) 5 MG  immediate release tablet Take 1 tablet (5 mg total) by mouth every 4 (four) hours as needed for severe pain.  ? Polyethyl Glycol-Propyl Glycol (SYSTANE OP) Place 1 drop into both eyes daily as needed (dry eyes).  ? prochlorperazine (COMPAZINE) 10 MG tablet Take 1 tablet (10 mg total) by mouth every 6 (six) hours as needed (Nausea or vomiting).  ? rivaroxaban (XARELTO) 20 MG TABS tablet Take 1 tablet (20 mg total) by mouth daily with supper.  ? vitamin B-12 (CYANOCOBALAMIN) 1000 MCG tablet Take 1,000 mcg by mouth daily.  ?  [EXPIRED] bortezomib SQ (VELCADE) chemo injection (2.43m/mL concentration) 2.25 mg   ? ?No facility-administered encounter medications on file as of 12/26/2021.  ? ? ?Allergies (verified) ?Patient has no known allergies.  ? ?History: ?Past Medical History:  ?Diagnosis Date  ? BPH (benign prostatic hypertrophy)   ? Degenerative disc disease   ? Diverticulosis   ? Hemochromatosis   ? HTN (hypertension)   ? Hyperlipidemia   ? Multiple myeloma (HStella   ? Polio   ? Status post Nissen fundoplication (without gastrostomy tube) procedure   ? for hiatal hernia.  ? ?Past Surgical History:  ?Procedure Laterality Date  ? FOOT SURGERY    ? HIATAL HERNIA REPAIR    ? OTHER SURGICAL HISTORY    ? Cataract Surgery--Both eyes  ? TOTAL KNEE ARTHROPLASTY Left 07/15/2021  ? Procedure: TOTAL KNEE ARTHROPLASTY;  Surgeon: LMarchia Bond MD;  Location: [facility];  Service: Orthopedics;  Laterality: Left;  ? Undescended testes Right 1968  ? ?Family History  ?Problem Relation Age of Onset  ? Breast cancer Mother   ? Breast cancer Sister   ? ?Social History  ? ?Socioeconomic History  ? Marital status: Single  ?  Spouse name: Not on file  ? Number of children: Not on file  ? Years of education: Not on file  ? Highest education level: Not on file  ?Occupational History  ? Not on file  ?Tobacco Use  ? Smoking status: Never  ? Smokeless tobacco: Never  ?Vaping Use  ? Vaping Use: Never used  ?Substance and Sexual Activity  ? Alcohol use: Not Currently  ?  Alcohol/week: 4.0 standard drinks  ?  Types: 4 drink(s) per week  ?  Comment: occasionally  ? Drug use: No  ? Sexual activity: Yes  ?Other Topics Concern  ? Not on file  ?Social History Narrative  ? Not on file  ? ?Social Determinants of Health  ? ?Financial Resource Strain: Low Risk   ? Difficulty of Paying Living Expenses: Not hard at all  ?Food Insecurity: No Food Insecurity  ? Worried About RCharity fundraiserin the Last Year: Never true  ? Ran Out of Food in the Last Year: Never true   ?Transportation Needs: No Transportation Needs  ? Lack of Transportation (Medical): No  ? Lack of Transportation (Non-Medical): No  ?Physical Activity: Insufficiently Active  ? Days of Exercise per Week: 2 days  ? Minutes of Exercise per Session: 20 min  ?Stress: No Stress Concern Present  ? Feeling of Stress : Not at all  ?Social Connections: Not on file  ? ? ?Tobacco Counseling ?Counseling given: Not Answered ? ? ?Clinical Intake: ? ?Pre-visit preparation completed: Yes ? ?Pain : No/denies pain ? ?  ? ?Nutritional Status: BMI > 30  Obese ?Nutritional Risks: None ?Diabetes: No ? ?How often do you need to have someone help you when you read instructions, pamphlets, or other written materials  from your doctor or pharmacy?: 1 - Never ?What is the last grade level you completed in school?: 12th grade ? ?Diabetic? no ? ?Interpreter Needed?: No ? ?Information entered by :: NAllen LPN ? ? ?Activities of Daily Living ? ?  12/26/2021  ?  2:42 PM 12/24/2021  ?  3:13 PM  ?In your present state of health, do you have any difficulty performing the following activities:  ?Hearing? 0 0  ?Vision? 0 0  ?Difficulty concentrating or making decisions? 0 0  ?Walking or climbing stairs? 0 0  ?Dressing or bathing? 0 0  ?Doing errands, shopping? 0 0  ?Preparing Food and eating ? N N  ?Using the Toilet? N N  ?In the past six months, have you accidently leaked urine? N N  ?Do you have problems with loss of bowel control? N N  ?Managing your Medications? N N  ?Managing your Finances? N N  ?Housekeeping or managing your Housekeeping? N N  ? ? ?Patient Care Team: ?Denita Lung, MD as PCP - General (Family Medicine) ?Heath Lark, MD as Consulting Physician (Hematology and Oncology) ? ?Indicate any recent Medical Services you may have received from other than [facility] providers in the past year (date may be approximate). ? ?   ?Assessment:  ? This is a routine wellness examination for [place]. ? ?Hearing/Vision screen ?Vision Screening -  Comments:: Regular eye exams, [facility] [place] ? ?Dietary issues and exercise activities discussed: ?Current Exercise Habits: Home exercise routine, Type of exercise: walking;Other - see comments (stationary bike), Time

## 2021-12-26 NOTE — Patient Instructions (Signed)
[facility]  Discharge Instructions: Thank you for choosing [facility] to provide your oncology and hematology care.   If you have a lab appointment with the [facility], please go directly to the [facility] and check in at the registration area.   Wear comfortable clothing and clothing appropriate for easy access to any Portacath or PICC line.   We strive to give you quality time with your provider. You may need to reschedule your appointment if you arrive late (15 or more minutes).  Arriving late affects you and other patients whose appointments are after yours.  Also, if you miss three or more appointments without notifying the office, you may be dismissed from the clinic at the provider's discretion.      For prescription refill requests, have your pharmacy contact our office and allow 72 hours for refills to be completed.    Today you received the following chemotherapy and/or immunotherapy agents Velcade       To help prevent nausea and vomiting after your treatment, we encourage you to take your nausea medication as directed.  BELOW ARE SYMPTOMS THAT SHOULD BE REPORTED IMMEDIATELY: *FEVER GREATER THAN 100.4 F (38 C) OR HIGHER *CHILLS OR SWEATING *NAUSEA AND VOMITING THAT IS NOT CONTROLLED WITH YOUR NAUSEA MEDICATION *UNUSUAL SHORTNESS OF BREATH *UNUSUAL BRUISING OR BLEEDING *URINARY PROBLEMS (pain or burning when urinating, or frequent urination) *BOWEL PROBLEMS (unusual diarrhea, constipation, pain near the anus) TENDERNESS IN MOUTH AND THROAT WITH OR WITHOUT PRESENCE OF ULCERS (sore throat, sores in mouth, or a toothache) UNUSUAL RASH, SWELLING OR PAIN  UNUSUAL VAGINAL DISCHARGE OR ITCHING   Items with * indicate a potential emergency and should be followed up as soon as possible or go to the Emergency Department if any problems should occur.  Please show the CHEMOTHERAPY ALERT CARD or IMMUNOTHERAPY ALERT CARD at check-in to  the Emergency Department and triage nurse.  Should you have questions after your visit or need to cancel or reschedule your appointment, please contact [facility]  Dept: [phone]  and follow the prompts.  Office hours are 8:00 a.m. to 4:30 p.m. Monday - Friday. Please note that voicemails left after 4:00 p.m. may not be returned until the following business day.  We are closed weekends and major holidays. You have access to a nurse at all times for urgent questions. Please call the main number to the clinic Dept: [phone] and follow the prompts.   For any non-urgent questions, you may also contact your provider using MyChart. We now offer e-Visits for anyone 18 and older to request care online for non-urgent symptoms. For details visit [email or web address].   Also download the MyChart app! Go to the app store, search "MyChart", open the app, select [facility], and log in with your MyChart username and password.  Due to Covid, a mask is required upon entering the hospital/clinic. If you do not have a mask, one will be given to you upon arrival. For doctor visits, patients may have 1 support person aged 18 or older with them. For treatment visits, patients cannot have anyone with them due to current Covid guidelines and our immunocompromised population.   

## 2021-12-29 ENCOUNTER — Telehealth: Payer: Self-pay

## 2021-12-29 ENCOUNTER — Other Ambulatory Visit: Payer: Self-pay | Admitting: Hematology and Oncology

## 2021-12-29 DIAGNOSIS — M7918 Myalgia, other site: Secondary | ICD-10-CM

## 2021-12-29 MED ORDER — OXYCODONE HCL 5 MG PO TABS: 5.0000 mg | ORAL_TABLET | ORAL | 0 refills | Status: DC | PRN

## 2021-12-29 NOTE — Telephone Encounter (Signed)
Sent to [facility].

## 2021-12-29 NOTE — Telephone Encounter (Signed)
Called and given below message. He verbalized understanding. 

## 2021-12-29 NOTE — Telephone Encounter (Signed)
He called and left a message requesting a refill of Oxycodone. ?

## 2021-12-30 ENCOUNTER — Other Ambulatory Visit: Payer: Self-pay | Admitting: Hematology and Oncology

## 2021-12-30 ENCOUNTER — Inpatient Hospital Stay: Payer: Medicare Other

## 2021-12-30 ENCOUNTER — Other Ambulatory Visit: Payer: Self-pay

## 2021-12-30 VITALS — BP 130/80 | HR 95 | Temp 98.2°F | Resp 18 | Wt 234.8 lb

## 2021-12-30 DIAGNOSIS — Z5112 Encounter for antineoplastic immunotherapy: Secondary | ICD-10-CM | POA: Diagnosis not present

## 2021-12-30 DIAGNOSIS — C9000 Multiple myeloma not having achieved remission: Secondary | ICD-10-CM

## 2021-12-30 DIAGNOSIS — D696 Thrombocytopenia, unspecified: Secondary | ICD-10-CM | POA: Diagnosis not present

## 2021-12-30 DIAGNOSIS — D61818 Other pancytopenia: Secondary | ICD-10-CM | POA: Diagnosis not present

## 2021-12-30 LAB — CBC WITH DIFFERENTIAL (CANCER CENTER ONLY)
Abs Immature Granulocytes: 0.02 10*3/uL (ref 0.00–0.07)
Basophils Absolute: 0 10*3/uL (ref 0.0–0.1)
Basophils Relative: 0 %
Eosinophils Absolute: 0.1 10*3/uL (ref 0.0–0.5)
Eosinophils Relative: 1 %
HCT: 39.4 % (ref 39.0–52.0)
Hemoglobin: 13.6 g/dL (ref 13.0–17.0)
Immature Granulocytes: 0 %
Lymphocytes Relative: 27 %
Lymphs Abs: 1.3 10*3/uL (ref 0.7–4.0)
MCH: 36.2 pg — ABNORMAL HIGH (ref 26.0–34.0)
MCHC: 34.5 g/dL (ref 30.0–36.0)
MCV: 104.8 fL — ABNORMAL HIGH (ref 80.0–100.0)
Monocytes Absolute: 1 10*3/uL (ref 0.1–1.0)
Monocytes Relative: 21 %
Neutro Abs: 2.5 10*3/uL (ref 1.7–7.7)
Neutrophils Relative %: 51 %
Platelet Count: 79 10*3/uL — ABNORMAL LOW (ref 150–400)
RBC: 3.76 MIL/uL — ABNORMAL LOW (ref 4.22–5.81)
RDW: 13.1 % (ref 11.5–15.5)
WBC Count: 4.9 10*3/uL (ref 4.0–10.5)
nRBC: 0 % (ref 0.0–0.2)

## 2021-12-30 LAB — CMP (CANCER CENTER ONLY)
ALT: 35 U/L (ref 0–44)
AST: 20 U/L (ref 15–41)
Albumin: 3.7 g/dL (ref 3.5–5.0)
Alkaline Phosphatase: 64 U/L (ref 38–126)
Anion gap: 5 (ref 5–15)
BUN: 18 mg/dL (ref 8–23)
CO2: 29 mmol/L (ref 22–32)
Calcium: 8.4 mg/dL — ABNORMAL LOW (ref 8.9–10.3)
Chloride: 104 mmol/L (ref 98–111)
Creatinine: 0.88 mg/dL (ref 0.61–1.24)
GFR, Estimated: >60 mL/min (ref >60)
Glucose, Bld: 94 mg/dL (ref 70–99)
Potassium: 4 mmol/L (ref 3.5–5.1)
Sodium: 138 mmol/L (ref 135–145)
Total Bilirubin: 0.8 mg/dL (ref 0.3–1.2)
Total Protein: 6.1 g/dL — ABNORMAL LOW (ref 6.5–8.1)

## 2021-12-30 MED ORDER — DIPHENHYDRAMINE HCL 25 MG PO CAPS
25.0000 mg | ORAL_CAPSULE | Freq: Once | ORAL | Status: AC
  Administered 2021-12-30: 25 mg via ORAL
  Filled 2021-12-30: qty 1

## 2021-12-30 MED ORDER — DEXAMETHASONE 4 MG PO TABS
20.0000 mg | ORAL_TABLET | Freq: Once | ORAL | Status: AC
  Administered 2021-12-30: 20 mg via ORAL
  Filled 2021-12-30: qty 5

## 2021-12-30 MED ORDER — ACETAMINOPHEN 325 MG PO TABS
650.0000 mg | ORAL_TABLET | Freq: Once | ORAL | Status: AC
  Administered 2021-12-30: 650 mg via ORAL
  Filled 2021-12-30: qty 2

## 2021-12-30 MED ORDER — DARATUMUMAB-HYALURONIDASE-FIHJ 1800-30000 MG-UT/15ML ~~LOC~~ SOLN
1800.0000 mg | Freq: Once | SUBCUTANEOUS | Status: AC
  Administered 2021-12-30: 1800 mg via SUBCUTANEOUS
  Filled 2021-12-30: qty 15

## 2021-12-31 ENCOUNTER — Telehealth: Payer: Self-pay | Admitting: Hematology and Oncology

## 2021-12-31 NOTE — Telephone Encounter (Signed)
Changed visit type per 3/29 secure chat. Patient does not have a port; therefore, all port flush/lab appointments were changed to lab MO. Left message. ?

## 2022-01-02 DIAGNOSIS — Z20822 Contact with and (suspected) exposure to covid-19: Secondary | ICD-10-CM | POA: Diagnosis not present

## 2022-01-06 ENCOUNTER — Inpatient Hospital Stay: Payer: Medicare Other | Attending: Hematology and Oncology

## 2022-01-06 ENCOUNTER — Inpatient Hospital Stay: Payer: Medicare Other

## 2022-01-06 ENCOUNTER — Other Ambulatory Visit: Payer: Self-pay

## 2022-01-06 VITALS — BP 129/76 | HR 99 | Temp 97.9°F | Resp 17 | Wt 233.8 lb

## 2022-01-06 DIAGNOSIS — D696 Thrombocytopenia, unspecified: Secondary | ICD-10-CM | POA: Insufficient documentation

## 2022-01-06 DIAGNOSIS — Z9221 Personal history of antineoplastic chemotherapy: Secondary | ICD-10-CM | POA: Diagnosis not present

## 2022-01-06 DIAGNOSIS — E669 Obesity, unspecified: Secondary | ICD-10-CM | POA: Diagnosis not present

## 2022-01-06 DIAGNOSIS — C9000 Multiple myeloma not having achieved remission: Secondary | ICD-10-CM | POA: Insufficient documentation

## 2022-01-06 DIAGNOSIS — I700 Atherosclerosis of aorta: Secondary | ICD-10-CM | POA: Insufficient documentation

## 2022-01-06 DIAGNOSIS — Z5112 Encounter for antineoplastic immunotherapy: Secondary | ICD-10-CM | POA: Diagnosis not present

## 2022-01-06 DIAGNOSIS — Z79899 Other long term (current) drug therapy: Secondary | ICD-10-CM | POA: Diagnosis not present

## 2022-01-06 DIAGNOSIS — G8929 Other chronic pain: Secondary | ICD-10-CM | POA: Insufficient documentation

## 2022-01-06 DIAGNOSIS — M549 Dorsalgia, unspecified: Secondary | ICD-10-CM | POA: Insufficient documentation

## 2022-01-06 DIAGNOSIS — Z6830 Body mass index (BMI) 30.0-30.9, adult: Secondary | ICD-10-CM | POA: Insufficient documentation

## 2022-01-06 DIAGNOSIS — Z7901 Long term (current) use of anticoagulants: Secondary | ICD-10-CM | POA: Insufficient documentation

## 2022-01-06 LAB — CMP (CANCER CENTER ONLY)
ALT: 33 U/L (ref 0–44)
AST: 18 U/L (ref 15–41)
Albumin: 3.8 g/dL (ref 3.5–5.0)
Alkaline Phosphatase: 66 U/L (ref 38–126)
Anion gap: 6 (ref 5–15)
BUN: 20 mg/dL (ref 8–23)
CO2: 30 mmol/L (ref 22–32)
Calcium: 9 mg/dL (ref 8.9–10.3)
Chloride: 103 mmol/L (ref 98–111)
Creatinine: 0.88 mg/dL (ref 0.61–1.24)
GFR, Estimated: >60 mL/min (ref >60)
Glucose, Bld: 117 mg/dL — ABNORMAL HIGH (ref 70–99)
Potassium: 3.9 mmol/L (ref 3.5–5.1)
Sodium: 139 mmol/L (ref 135–145)
Total Bilirubin: 1 mg/dL (ref 0.3–1.2)
Total Protein: 6.1 g/dL — ABNORMAL LOW (ref 6.5–8.1)

## 2022-01-06 LAB — CBC WITH DIFFERENTIAL (CANCER CENTER ONLY)
Abs Immature Granulocytes: 0.01 10*3/uL (ref 0.00–0.07)
Basophils Absolute: 0 10*3/uL (ref 0.0–0.1)
Basophils Relative: 1 %
Eosinophils Absolute: 0.1 10*3/uL (ref 0.0–0.5)
Eosinophils Relative: 2 %
HCT: 39.6 % (ref 39.0–52.0)
Hemoglobin: 13.8 g/dL (ref 13.0–17.0)
Immature Granulocytes: 0 %
Lymphocytes Relative: 32 %
Lymphs Abs: 1.3 10*3/uL (ref 0.7–4.0)
MCH: 36 pg — ABNORMAL HIGH (ref 26.0–34.0)
MCHC: 34.8 g/dL (ref 30.0–36.0)
MCV: 103.4 fL — ABNORMAL HIGH (ref 80.0–100.0)
Monocytes Absolute: 0.7 10*3/uL (ref 0.1–1.0)
Monocytes Relative: 17 %
Neutro Abs: 2 10*3/uL (ref 1.7–7.7)
Neutrophils Relative %: 48 %
Platelet Count: 110 10*3/uL — ABNORMAL LOW (ref 150–400)
RBC: 3.83 MIL/uL — ABNORMAL LOW (ref 4.22–5.81)
RDW: 13.3 % (ref 11.5–15.5)
WBC Count: 4 10*3/uL (ref 4.0–10.5)
nRBC: 0 % (ref 0.0–0.2)

## 2022-01-06 MED ORDER — DARATUMUMAB-HYALURONIDASE-FIHJ 1800-30000 MG-UT/15ML ~~LOC~~ SOLN
1800.0000 mg | Freq: Once | SUBCUTANEOUS | Status: AC
  Administered 2022-01-06: 1800 mg via SUBCUTANEOUS
  Filled 2022-01-06: qty 15

## 2022-01-06 MED ORDER — ACETAMINOPHEN 325 MG PO TABS
650.0000 mg | ORAL_TABLET | Freq: Once | ORAL | Status: AC
  Administered 2022-01-06: 650 mg via ORAL
  Filled 2022-01-06: qty 2

## 2022-01-06 MED ORDER — DEXAMETHASONE 4 MG PO TABS
20.0000 mg | ORAL_TABLET | Freq: Once | ORAL | Status: AC
  Administered 2022-01-06: 20 mg via ORAL
  Filled 2022-01-06: qty 5

## 2022-01-06 MED ORDER — DIPHENHYDRAMINE HCL 25 MG PO CAPS
25.0000 mg | ORAL_CAPSULE | Freq: Once | ORAL | Status: AC
  Administered 2022-01-06: 25 mg via ORAL
  Filled 2022-01-06: qty 1

## 2022-01-06 MED ORDER — BORTEZOMIB CHEMO SQ INJECTION 3.5 MG (2.5MG/ML)
1.0400 mg/m2 | Freq: Once | INTRAMUSCULAR | Status: AC
  Administered 2022-01-06: 2.25 mg via SUBCUTANEOUS
  Filled 2022-01-06: qty 0.9

## 2022-01-06 NOTE — Patient Instructions (Signed)
[facility]  Discharge Instructions: ?Thank you for choosing [facility] to provide your oncology and hematology care.  ? ?If you have a lab appointment with the [facility], please go directly to the [facility] and check in at the registration area. ?  ?Wear comfortable clothing and clothing appropriate for easy access to any Portacath or PICC line.  ? ?We strive to give you quality time with your provider. You may need to reschedule your appointment if you arrive late (15 or more minutes).  Arriving late affects you and other patients whose appointments are after yours.  Also, if you miss three or more appointments without notifying the office, you may be dismissed from the clinic at the provider?s discretion.    ?  ?For prescription refill requests, have your pharmacy contact our office and allow 72 hours for refills to be completed.   ? ?Today you received the following chemotherapy and/or immunotherapy agents: Velcade and Darzalex Faspro    ?  ?To help prevent nausea and vomiting after your treatment, we encourage you to take your nausea medication as directed. ? ?BELOW ARE SYMPTOMS THAT SHOULD BE REPORTED IMMEDIATELY: ?*FEVER GREATER THAN 100.4 F (38 ?C) OR HIGHER ?*CHILLS OR SWEATING ?*NAUSEA AND VOMITING THAT IS NOT CONTROLLED WITH YOUR NAUSEA MEDICATION ?*UNUSUAL SHORTNESS OF BREATH ?*UNUSUAL BRUISING OR BLEEDING ?*URINARY PROBLEMS (pain or burning when urinating, or frequent urination) ?*BOWEL PROBLEMS (unusual diarrhea, constipation, pain near the anus) ?TENDERNESS IN MOUTH AND THROAT WITH OR WITHOUT PRESENCE OF ULCERS (sore throat, sores in mouth, or a toothache) ?UNUSUAL RASH, SWELLING OR PAIN  ?UNUSUAL VAGINAL DISCHARGE OR ITCHING  ? ?Items with * indicate a potential emergency and should be followed up as soon as possible or go to the Emergency Department if any problems should occur. ? ?Please show the CHEMOTHERAPY ALERT CARD or IMMUNOTHERAPY ALERT  CARD at check-in to the Emergency Department and triage nurse. ? ?Should you have questions after your visit or need to cancel or reschedule your appointment, please contact [facility]  Dept: [phone]  and follow the prompts.  Office hours are 8:00 a.m. to 4:30 p.m. Monday - Friday. Please note that voicemails left after 4:00 p.m. may not be returned until the following business day.  We are closed weekends and major holidays. You have access to a nurse at all times for urgent questions. Please call the main number to the clinic Dept: [phone] and follow the prompts. ? ? ?For any non-urgent questions, you may also contact your provider using MyChart. We now offer e-Visits for anyone 91 and older to request care online for non-urgent symptoms. For details visit [email or web address]. ?  ?Also download the MyChart app! Go to the app store, search "MyChart", open the app, select [facility], and log in with your MyChart username and password. ? ?Due to Covid, a mask is required upon entering the hospital/clinic. If you do not have a mask, one will be given to you upon arrival. For doctor visits, patients may have 1 support person aged 30 or older with them. For treatment visits, patients cannot have anyone with them due to current Covid guidelines and our immunocompromised population.  ? ?

## 2022-01-07 LAB — KAPPA/LAMBDA LIGHT CHAINS
Kappa free light chain: 9.7 mg/L (ref 3.3–19.4)
Kappa, lambda light chain ratio: 3.03 — ABNORMAL HIGH (ref 0.26–1.65)
Lambda free light chains: 3.2 mg/L — ABNORMAL LOW (ref 5.7–26.3)

## 2022-01-09 ENCOUNTER — Inpatient Hospital Stay: Payer: Medicare Other

## 2022-01-09 ENCOUNTER — Other Ambulatory Visit: Payer: Self-pay

## 2022-01-09 VITALS — BP 148/94 | HR 74 | Temp 98.1°F | Resp 18

## 2022-01-09 DIAGNOSIS — D696 Thrombocytopenia, unspecified: Secondary | ICD-10-CM | POA: Diagnosis not present

## 2022-01-09 DIAGNOSIS — E669 Obesity, unspecified: Secondary | ICD-10-CM | POA: Diagnosis not present

## 2022-01-09 DIAGNOSIS — G8929 Other chronic pain: Secondary | ICD-10-CM | POA: Diagnosis not present

## 2022-01-09 DIAGNOSIS — Z5112 Encounter for antineoplastic immunotherapy: Secondary | ICD-10-CM | POA: Diagnosis not present

## 2022-01-09 DIAGNOSIS — C9000 Multiple myeloma not having achieved remission: Secondary | ICD-10-CM

## 2022-01-09 DIAGNOSIS — Z6830 Body mass index (BMI) 30.0-30.9, adult: Secondary | ICD-10-CM | POA: Diagnosis not present

## 2022-01-09 LAB — MULTIPLE MYELOMA PANEL, SERUM
Albumin SerPl Elph-Mcnc: 3.3 g/dL (ref 2.9–4.4)
Albumin/Glob SerPl: 1.5 (ref 0.7–1.7)
Alpha 1: 0.3 g/dL (ref 0.0–0.4)
Alpha2 Glob SerPl Elph-Mcnc: 0.7 g/dL (ref 0.4–1.0)
B-Globulin SerPl Elph-Mcnc: 0.8 g/dL (ref 0.7–1.3)
Gamma Glob SerPl Elph-Mcnc: 0.5 g/dL (ref 0.4–1.8)
Globulin, Total: 2.3 g/dL (ref 2.2–3.9)
IgA: 14 mg/dL — ABNORMAL LOW (ref 61–437)
IgG (Immunoglobin G), Serum: 601 mg/dL — ABNORMAL LOW (ref 603–1613)
IgM (Immunoglobulin M), Srm: 13 mg/dL — ABNORMAL LOW (ref 20–172)
M Protein SerPl Elph-Mcnc: 0.1 g/dL — ABNORMAL HIGH
Total Protein ELP: 5.6 g/dL — ABNORMAL LOW (ref 6.0–8.5)

## 2022-01-09 MED ORDER — BORTEZOMIB CHEMO SQ INJECTION 3.5 MG (2.5MG/ML)
1.0400 mg/m2 | Freq: Once | INTRAMUSCULAR | Status: AC
  Administered 2022-01-09: 2.25 mg via SUBCUTANEOUS
  Filled 2022-01-09: qty 0.9

## 2022-01-09 NOTE — Patient Instructions (Signed)
[facility]  Discharge Instructions: Thank you for choosing [facility] to provide your oncology and hematology care.   If you have a lab appointment with the Cancer Center, please go directly to the Cancer Center and check in at the registration area.   Wear comfortable clothing and clothing appropriate for easy access to any Portacath or PICC line.   We strive to give you quality time with your provider. You may need to reschedule your appointment if you arrive late (15 or more minutes).  Arriving late affects you and other patients whose appointments are after yours.  Also, if you miss three or more appointments without notifying the office, you may be dismissed from the clinic at the provider's discretion.      For prescription refill requests, have your pharmacy contact our office and allow 72 hours for refills to be completed.    Today you received the following chemotherapy and/or immunotherapy agents Velcade       To help prevent nausea and vomiting after your treatment, we encourage you to take your nausea medication as directed.  BELOW ARE SYMPTOMS THAT SHOULD BE REPORTED IMMEDIATELY: *FEVER GREATER THAN 100.4 F (38 C) OR HIGHER *CHILLS OR SWEATING *NAUSEA AND VOMITING THAT IS NOT CONTROLLED WITH YOUR NAUSEA MEDICATION *UNUSUAL SHORTNESS OF BREATH *UNUSUAL BRUISING OR BLEEDING *URINARY PROBLEMS (pain or burning when urinating, or frequent urination) *BOWEL PROBLEMS (unusual diarrhea, constipation, pain near the anus) TENDERNESS IN MOUTH AND THROAT WITH OR WITHOUT PRESENCE OF ULCERS (sore throat, sores in mouth, or a toothache) UNUSUAL RASH, SWELLING OR PAIN  UNUSUAL VAGINAL DISCHARGE OR ITCHING   Items with * indicate a potential emergency and should be followed up as soon as possible or go to the Emergency Department if any problems should occur.  Please show the CHEMOTHERAPY ALERT CARD or IMMUNOTHERAPY ALERT CARD at check-in to  the Emergency Department and triage nurse.  Should you have questions after your visit or need to cancel or reschedule your appointment, please contact [facility]  Dept: [phone]  and follow the prompts.  Office hours are 8:00 a.m. to 4:30 p.m. Monday - Friday. Please note that voicemails left after 4:00 p.m. may not be returned until the following business day.  We are closed weekends and major holidays. You have access to a nurse at all times for urgent questions. Please call the main number to the clinic Dept: [phone] and follow the prompts.   For any non-urgent questions, you may also contact your provider using MyChart. We now offer e-Visits for anyone 18 and older to request care online for non-urgent symptoms. For details visit [email or web address].   Also download the MyChart app! Go to the app store, search "MyChart", open the app, select , and log in with your MyChart username and password.  Due to Covid, a mask is required upon entering the hospital/clinic. If you do not have a mask, one will be given to you upon arrival. For doctor visits, patients may have 1 support person aged 18 or older with them. For treatment visits, patients cannot have anyone with them due to current Covid guidelines and our immunocompromised population.   

## 2022-01-13 ENCOUNTER — Other Ambulatory Visit: Payer: Self-pay | Admitting: Hematology and Oncology

## 2022-01-13 ENCOUNTER — Inpatient Hospital Stay: Payer: Medicare Other

## 2022-01-13 ENCOUNTER — Other Ambulatory Visit: Payer: Self-pay

## 2022-01-13 VITALS — BP 128/82 | HR 70 | Temp 97.7°F | Resp 18 | Wt 234.8 lb

## 2022-01-13 DIAGNOSIS — Z20822 Contact with and (suspected) exposure to covid-19: Secondary | ICD-10-CM | POA: Diagnosis not present

## 2022-01-13 DIAGNOSIS — C9000 Multiple myeloma not having achieved remission: Secondary | ICD-10-CM | POA: Diagnosis not present

## 2022-01-13 DIAGNOSIS — G8929 Other chronic pain: Secondary | ICD-10-CM | POA: Diagnosis not present

## 2022-01-13 DIAGNOSIS — Z6830 Body mass index (BMI) 30.0-30.9, adult: Secondary | ICD-10-CM | POA: Diagnosis not present

## 2022-01-13 DIAGNOSIS — D696 Thrombocytopenia, unspecified: Secondary | ICD-10-CM | POA: Diagnosis not present

## 2022-01-13 DIAGNOSIS — E669 Obesity, unspecified: Secondary | ICD-10-CM | POA: Diagnosis not present

## 2022-01-13 DIAGNOSIS — Z5112 Encounter for antineoplastic immunotherapy: Secondary | ICD-10-CM | POA: Diagnosis not present

## 2022-01-13 LAB — CMP (CANCER CENTER ONLY)
ALT: 33 U/L (ref 0–44)
AST: 21 U/L (ref 15–41)
Albumin: 3.7 g/dL (ref 3.5–5.0)
Alkaline Phosphatase: 61 U/L (ref 38–126)
Anion gap: 3 — ABNORMAL LOW (ref 5–15)
BUN: 24 mg/dL — ABNORMAL HIGH (ref 8–23)
CO2: 32 mmol/L (ref 22–32)
Calcium: 8.7 mg/dL — ABNORMAL LOW (ref 8.9–10.3)
Chloride: 105 mmol/L (ref 98–111)
Creatinine: 0.77 mg/dL (ref 0.61–1.24)
GFR, Estimated: >60 mL/min (ref >60)
Glucose, Bld: 107 mg/dL — ABNORMAL HIGH (ref 70–99)
Potassium: 4.2 mmol/L (ref 3.5–5.1)
Sodium: 140 mmol/L (ref 135–145)
Total Bilirubin: 0.8 mg/dL (ref 0.3–1.2)
Total Protein: 6 g/dL — ABNORMAL LOW (ref 6.5–8.1)

## 2022-01-13 LAB — CBC WITH DIFFERENTIAL (CANCER CENTER ONLY)
Abs Immature Granulocytes: 0.01 10*3/uL (ref 0.00–0.07)
Basophils Absolute: 0 10*3/uL (ref 0.0–0.1)
Basophils Relative: 0 %
Eosinophils Absolute: 0.1 10*3/uL (ref 0.0–0.5)
Eosinophils Relative: 1 %
HCT: 38.2 % — ABNORMAL LOW (ref 39.0–52.0)
Hemoglobin: 12.9 g/dL — ABNORMAL LOW (ref 13.0–17.0)
Immature Granulocytes: 0 %
Lymphocytes Relative: 39 %
Lymphs Abs: 2.1 10*3/uL (ref 0.7–4.0)
MCH: 35.7 pg — ABNORMAL HIGH (ref 26.0–34.0)
MCHC: 33.8 g/dL (ref 30.0–36.0)
MCV: 105.8 fL — ABNORMAL HIGH (ref 80.0–100.0)
Monocytes Absolute: 0.9 10*3/uL (ref 0.1–1.0)
Monocytes Relative: 17 %
Neutro Abs: 2.3 10*3/uL (ref 1.7–7.7)
Neutrophils Relative %: 43 %
Platelet Count: 87 10*3/uL — ABNORMAL LOW (ref 150–400)
RBC: 3.61 MIL/uL — ABNORMAL LOW (ref 4.22–5.81)
RDW: 13.4 % (ref 11.5–15.5)
WBC Count: 5.3 10*3/uL (ref 4.0–10.5)
nRBC: 0 % (ref 0.0–0.2)

## 2022-01-13 MED ORDER — BORTEZOMIB CHEMO SQ INJECTION 3.5 MG (2.5MG/ML)
1.0400 mg/m2 | Freq: Once | INTRAMUSCULAR | Status: AC
  Administered 2022-01-13: 2.25 mg via SUBCUTANEOUS
  Filled 2022-01-13: qty 0.9

## 2022-01-13 MED ORDER — DARATUMUMAB-HYALURONIDASE-FIHJ 1800-30000 MG-UT/15ML ~~LOC~~ SOLN
1800.0000 mg | Freq: Once | SUBCUTANEOUS | Status: AC
  Administered 2022-01-13: 1800 mg via SUBCUTANEOUS
  Filled 2022-01-13: qty 15

## 2022-01-13 MED ORDER — DEXAMETHASONE 4 MG PO TABS
20.0000 mg | ORAL_TABLET | Freq: Once | ORAL | Status: AC
  Administered 2022-01-13: 20 mg via ORAL
  Filled 2022-01-13: qty 5

## 2022-01-13 MED ORDER — DIPHENHYDRAMINE HCL 25 MG PO CAPS
25.0000 mg | ORAL_CAPSULE | Freq: Once | ORAL | Status: AC
  Administered 2022-01-13: 25 mg via ORAL
  Filled 2022-01-13: qty 1

## 2022-01-13 MED ORDER — ACETAMINOPHEN 325 MG PO TABS
650.0000 mg | ORAL_TABLET | Freq: Once | ORAL | Status: AC
  Administered 2022-01-13: 650 mg via ORAL
  Filled 2022-01-13: qty 2

## 2022-01-13 NOTE — Patient Instructions (Signed)
[facility]  Discharge Instructions: ?Thank you for choosing [facility] to provide your oncology and hematology care.  ? ?If you have a lab appointment with the [facility], please go directly to the [facility] and check in at the registration area. ?  ?Wear comfortable clothing and clothing appropriate for easy access to any Portacath or PICC line.  ? ?We strive to give you quality time with your provider. You may need to reschedule your appointment if you arrive late (15 or more minutes).  Arriving late affects you and other patients whose appointments are after yours.  Also, if you miss three or more appointments without notifying the office, you may be dismissed from the clinic at the provider?s discretion.    ?  ?For prescription refill requests, have your pharmacy contact our office and allow 72 hours for refills to be completed.   ? ?Today you received the following chemotherapy and/or immunotherapy agents: Velcade and Darzalex Faspro    ?  ?To help prevent nausea and vomiting after your treatment, we encourage you to take your nausea medication as directed. ? ?BELOW ARE SYMPTOMS THAT SHOULD BE REPORTED IMMEDIATELY: ?*FEVER GREATER THAN 100.4 F (38 ?C) OR HIGHER ?*CHILLS OR SWEATING ?*NAUSEA AND VOMITING THAT IS NOT CONTROLLED WITH YOUR NAUSEA MEDICATION ?*UNUSUAL SHORTNESS OF BREATH ?*UNUSUAL BRUISING OR BLEEDING ?*URINARY PROBLEMS (pain or burning when urinating, or frequent urination) ?*BOWEL PROBLEMS (unusual diarrhea, constipation, pain near the anus) ?TENDERNESS IN MOUTH AND THROAT WITH OR WITHOUT PRESENCE OF ULCERS (sore throat, sores in mouth, or a toothache) ?UNUSUAL RASH, SWELLING OR PAIN  ?UNUSUAL VAGINAL DISCHARGE OR ITCHING  ? ?Items with * indicate a potential emergency and should be followed up as soon as possible or go to the Emergency Department if any problems should occur. ? ?Please show the CHEMOTHERAPY ALERT CARD or IMMUNOTHERAPY ALERT  CARD at check-in to the Emergency Department and triage nurse. ? ?Should you have questions after your visit or need to cancel or reschedule your appointment, please contact [facility]  Dept: [phone]  and follow the prompts.  Office hours are 8:00 a.m. to 4:30 p.m. Monday - Friday. Please note that voicemails left after 4:00 p.m. may not be returned until the following business day.  We are closed weekends and major holidays. You have access to a nurse at all times for urgent questions. Please call the main number to the clinic Dept: [phone] and follow the prompts. ? ? ?For any non-urgent questions, you may also contact your provider using MyChart. We now offer e-Visits for anyone 16 and older to request care online for non-urgent symptoms. For details visit [email or web address]. ?  ?Also download the MyChart app! Go to the app store, search "MyChart", open the app, select [facility], and log in with your MyChart username and password. ? ?Due to Covid, a mask is required upon entering the hospital/clinic. If you do not have a mask, one will be given to you upon arrival. For doctor visits, patients may have 1 support person aged 31 or older with them. For treatment visits, patients cannot have anyone with them due to current Covid guidelines and our immunocompromised population.  ? ?

## 2022-01-13 NOTE — Progress Notes (Signed)
Ok to treat today 01/13/2022 with current labs per Dr. Alvy Bimler. She is going to [place] Friday. Patient is aware.  ?

## 2022-01-14 ENCOUNTER — Telehealth: Payer: Self-pay

## 2022-01-14 ENCOUNTER — Other Ambulatory Visit: Payer: Self-pay | Admitting: Hematology and Oncology

## 2022-01-14 DIAGNOSIS — M7918 Myalgia, other site: Secondary | ICD-10-CM

## 2022-01-14 MED ORDER — OXYCODONE HCL 5 MG PO TABS: 5.0000 mg | ORAL_TABLET | ORAL | 0 refills | Status: DC | PRN

## 2022-01-14 NOTE — Telephone Encounter (Signed)
Pt called and requests refill on Oxycodone '5mg'$  1 tab PO Q4H PRN pain qty 60, last filled 12/29/21. Pt states he will be going to [place], [place] next week and asks for the refill before then. Advised pt Rx may be too soon to fill for insurance to cover but this medication is fairly inexpensive if MD chooses to refill. Pt verbalized thanks and understanding.  ?

## 2022-01-14 NOTE — Telephone Encounter (Signed)
[facility]One

## 2022-01-16 ENCOUNTER — Inpatient Hospital Stay: Payer: Medicare Other

## 2022-01-19 DIAGNOSIS — Z20822 Contact with and (suspected) exposure to covid-19: Secondary | ICD-10-CM | POA: Diagnosis not present

## 2022-01-20 ENCOUNTER — Inpatient Hospital Stay: Payer: Medicare Other

## 2022-01-20 ENCOUNTER — Other Ambulatory Visit: Payer: Self-pay

## 2022-01-20 ENCOUNTER — Inpatient Hospital Stay (HOSPITAL_BASED_OUTPATIENT_CLINIC_OR_DEPARTMENT_OTHER): Payer: Medicare Other | Admitting: Hematology and Oncology

## 2022-01-20 ENCOUNTER — Encounter: Payer: Self-pay | Admitting: Hematology and Oncology

## 2022-01-20 DIAGNOSIS — G8929 Other chronic pain: Secondary | ICD-10-CM | POA: Diagnosis not present

## 2022-01-20 DIAGNOSIS — Z6830 Body mass index (BMI) 30.0-30.9, adult: Secondary | ICD-10-CM | POA: Diagnosis not present

## 2022-01-20 DIAGNOSIS — C9000 Multiple myeloma not having achieved remission: Secondary | ICD-10-CM

## 2022-01-20 DIAGNOSIS — E669 Obesity, unspecified: Secondary | ICD-10-CM

## 2022-01-20 DIAGNOSIS — D696 Thrombocytopenia, unspecified: Secondary | ICD-10-CM

## 2022-01-20 DIAGNOSIS — Z5112 Encounter for antineoplastic immunotherapy: Secondary | ICD-10-CM | POA: Diagnosis not present

## 2022-01-20 LAB — CBC WITH DIFFERENTIAL (CANCER CENTER ONLY)
Abs Immature Granulocytes: 0.01 10*3/uL (ref 0.00–0.07)
Basophils Absolute: 0 10*3/uL (ref 0.0–0.1)
Basophils Relative: 0 %
Eosinophils Absolute: 0.1 10*3/uL (ref 0.0–0.5)
Eosinophils Relative: 2 %
HCT: 39.8 % (ref 39.0–52.0)
Hemoglobin: 13.6 g/dL (ref 13.0–17.0)
Immature Granulocytes: 0 %
Lymphocytes Relative: 44 %
Lymphs Abs: 2.5 10*3/uL (ref 0.7–4.0)
MCH: 35.9 pg — ABNORMAL HIGH (ref 26.0–34.0)
MCHC: 34.2 g/dL (ref 30.0–36.0)
MCV: 105 fL — ABNORMAL HIGH (ref 80.0–100.0)
Monocytes Absolute: 0.9 10*3/uL (ref 0.1–1.0)
Monocytes Relative: 16 %
Neutro Abs: 2.1 10*3/uL (ref 1.7–7.7)
Neutrophils Relative %: 38 %
Platelet Count: 97 10*3/uL — ABNORMAL LOW (ref 150–400)
RBC: 3.79 MIL/uL — ABNORMAL LOW (ref 4.22–5.81)
RDW: 13.7 % (ref 11.5–15.5)
WBC Count: 5.7 10*3/uL (ref 4.0–10.5)
nRBC: 0 % (ref 0.0–0.2)

## 2022-01-20 LAB — CMP (CANCER CENTER ONLY)
ALT: 34 U/L (ref 0–44)
AST: 19 U/L (ref 15–41)
Albumin: 3.8 g/dL (ref 3.5–5.0)
Alkaline Phosphatase: 58 U/L (ref 38–126)
Anion gap: 5 (ref 5–15)
BUN: 26 mg/dL — ABNORMAL HIGH (ref 8–23)
CO2: 30 mmol/L (ref 22–32)
Calcium: 8.7 mg/dL — ABNORMAL LOW (ref 8.9–10.3)
Chloride: 104 mmol/L (ref 98–111)
Creatinine: 0.82 mg/dL (ref 0.61–1.24)
GFR, Estimated: >60 mL/min (ref >60)
Glucose, Bld: 105 mg/dL — ABNORMAL HIGH (ref 70–99)
Potassium: 3.7 mmol/L (ref 3.5–5.1)
Sodium: 139 mmol/L (ref 135–145)
Total Bilirubin: 1 mg/dL (ref 0.3–1.2)
Total Protein: 6.1 g/dL — ABNORMAL LOW (ref 6.5–8.1)

## 2022-01-20 MED ORDER — DIPHENHYDRAMINE HCL 25 MG PO CAPS
25.0000 mg | ORAL_CAPSULE | Freq: Once | ORAL | Status: AC
  Administered 2022-01-20: 25 mg via ORAL
  Filled 2022-01-20: qty 1

## 2022-01-20 MED ORDER — DEXAMETHASONE 4 MG PO TABS
4.0000 mg | ORAL_TABLET | Freq: Once | ORAL | Status: AC
  Administered 2022-01-20: 4 mg via ORAL
  Filled 2022-01-20: qty 1

## 2022-01-20 MED ORDER — DARATUMUMAB-HYALURONIDASE-FIHJ 1800-30000 MG-UT/15ML ~~LOC~~ SOLN
1800.0000 mg | Freq: Once | SUBCUTANEOUS | Status: AC
  Administered 2022-01-20: 1800 mg via SUBCUTANEOUS
  Filled 2022-01-20: qty 15

## 2022-01-20 MED ORDER — ACETAMINOPHEN 325 MG PO TABS
650.0000 mg | ORAL_TABLET | Freq: Once | ORAL | Status: AC
  Administered 2022-01-20: 650 mg via ORAL
  Filled 2022-01-20: qty 2

## 2022-01-20 NOTE — Assessment & Plan Note (Signed)
This is likely due to recent treatment. The patient denies recent history of bleeding such as epistaxis, hematuria or hematochezia. He is asymptomatic from the low platelet count. I will observe for now.  

## 2022-01-20 NOTE — Progress Notes (Signed)
[facility] ?OFFICE PROGRESS NOTE ? ?Patient Care Team: ?Denita Lung, MD as PCP - General (Family Medicine) ?Heath Lark, MD as Consulting Physician (Hematology and Oncology) ? ?ASSESSMENT & PLAN:  ?Multiple myeloma without remission ([place]) ?I have reviewed recent myeloma panel with the patient ?He has excellent response to treatment with near complete remission ?He has significant pancytopenia recently but overall not symptomatic ?We have long discussion ?Ultimately, he is in agreement to have his treatments spaced out a little bit ?I plan to reduce the dexamethasone to 4 mg each time due to recent excessive weight gain ?He will complete his cycle 3 today and begin cycle 4 next week ?Afterwards, his treatment will be every 21 days ?He will continue acyclovir for antimicrobial prophylaxis ?He will continue calcium with vitamin D and Zometa every 3 months ?We will continue myeloma panel monitoring on the monthly basis ? ?Thrombocytopenia ([place]) ?This is likely due to recent treatment. The patient denies recent history of bleeding such as epistaxis, hematuria or hematochezia. He is asymptomatic from the low platelet count. I will observe for now.   ? ?Obesity (BMI 30-39.9) ?We discussed the importance of weight loss and healthy living ?I will reduce steroid treatment with each dose ? ?No orders of the defined types were placed in this encounter. ? ? ?All questions were answered. The patient knows to call the clinic with any problems, questions or concerns. ?The total time spent in the appointment was 30 minutes encounter with patients including review of chart and various tests results, discussions about plan of care and coordination of care plan ?  ?Heath Lark, MD ?01/20/2022 1:44 PM ? ?INTERVAL HISTORY: ?Please see below for problem oriented charting. ?he returns for treatment follow-up ?He is doing well ?Denies peripheral neuropathy ?He has intermittent chronic back pain ?No recent  infection ? ?REVIEW OF SYSTEMS:   ?Constitutional: Denies fevers, chills or abnormal weight loss ?Eyes: Denies blurriness of vision ?Ears, nose, mouth, throat, and face: Denies mucositis or sore throat ?Respiratory: Denies cough, dyspnea or wheezes ?Cardiovascular: Denies palpitation, chest discomfort or lower extremity swelling ?Gastrointestinal:  Denies nausea, heartburn or change in bowel habits ?Skin: Denies abnormal skin rashes ?Lymphatics: Denies new lymphadenopathy or easy bruising ?Neurological:Denies numbness, tingling or new weaknesses ?Behavioral/Psych: Mood is stable, no new changes  ?All other systems were reviewed with the patient and are negative. ? ?I have reviewed the past medical history, past surgical history, social history and family history with the patient and they are unchanged from previous note. ? ?ALLERGIES:  has No Known Allergies. ? ?MEDICATIONS:  ?Current Outpatient Medications  ?Medication Sig Dispense Refill  ? acetaminophen (TYLENOL) 650 MG CR tablet Take 650 mg by mouth every 8 (eight) hours as needed for pain.    ? acyclovir (ZOVIRAX) 400 MG tablet Take 1 tablet (400 mg total) by mouth 2 (two) times daily. (Patient taking differently: Take 400 mg by mouth daily.) 180 tablet 11  ? atorvastatin (LIPITOR) 20 MG tablet Take 1 tablet (20 mg total) by mouth daily. 90 tablet 4  ? calcium carbonate (TUMS - DOSED IN MG ELEMENTAL CALCIUM) 500 MG chewable tablet Chew 1-2 tablets by mouth daily as needed for heartburn.    ? Cholecalciferol (VITAMIN D) 50 MCG (2000 UT) tablet Take 2,000 Units by mouth daily.    ? folic acid (FOLVITE) 151 MCG tablet Take 400 mcg by mouth daily.    ? losartan-hydrochlorothiazide (HYZAAR) 100-12.5 MG tablet TAKE 1 TABLET BY MOUTH DAILY 90 tablet  0  ? Multiple Vitamin (MULTI-VITAMIN) tablet Take 1 tablet by mouth daily.    ? ondansetron (ZOFRAN) 4 MG tablet Take 1 tablet (4 mg total) by mouth every 6 (six) hours as needed for nausea. 20 tablet 0  ? ondansetron  (ZOFRAN) 8 MG tablet Take 1 tablet (8 mg total) by mouth 2 (two) times daily as needed (Nausea or vomiting). 30 tablet 1  ? oxyCODONE (ROXICODONE) 5 MG immediate release tablet Take 1 tablet (5 mg total) by mouth every 4 (four) hours as needed for severe pain. 60 tablet 0  ? Polyethyl Glycol-Propyl Glycol (SYSTANE OP) Place 1 drop into both eyes daily as needed (dry eyes).    ? prochlorperazine (COMPAZINE) 10 MG tablet Take 1 tablet (10 mg total) by mouth every 6 (six) hours as needed (Nausea or vomiting). 30 tablet 1  ? rivaroxaban (XARELTO) 20 MG TABS tablet Take 1 tablet (20 mg total) by mouth daily with supper. 90 tablet 1  ? vitamin B-12 (CYANOCOBALAMIN) 1000 MCG tablet Take 1,000 mcg by mouth daily.    ? ?No current facility-administered medications for this visit.  ? ?Facility-Administered Medications Ordered in Other Visits  ?Medication Dose Route Frequency Provider Last Rate Last Admin  ? daratumumab-hyaluronidase-fihj (DARZALEX FASPRO) 1800-30000 MG-UT/15ML chemo SQ injection 1,800 mg  1,800 mg Subcutaneous Once Heath Lark, MD      ? ? ?SUMMARY OF ONCOLOGIC HISTORY: ?Oncology History  ?Multiple myeloma without remission ([place])  ?03/20/2019 Imaging  ? 1. Tumor involving the L5 and S1 and S2 segments of the spine as described with mass effects upon the left L4, L5, S1 and more distal left sacral nerves as described above. ?2. Does the patient have a history of malignancy? ?3. Multiple plasmacytomas and metastatic disease could give this appearance ?  ?03/27/2019 Pathology Results  ? Soft Tissue Needle Core Biopsy, sacral mass ?- PLASMABLASTIC NEOPLASM. ?- SEE COMMENT. ?Microscopic Comment ?The sections show needle core biopsy fragments of soft tissue densely infiltrated by a relatively monomorphic infiltrate of atypical plasmacytoid cells characterized by vesicular or partially clumped chromatin and prominent nucleoli. This is associated with scattered mitosis. A battery of immunohistochemical stains was  performed and show that the atypical plasmacytoid cells are positive for CD138, CD43 and cytoplasmic kappa. There is also weak positivity for CD56 and partial variable positivity for cyclin D1. The atypical plasmacytoid cells are negative for cytoplasmic lambda, CD10, PAX5, CD79a, CD20, CD3, CD5, CD34, EBV (ISH) and mostly negative for LCA. The overall morphologic and histologic features are most compatible with a plasmablastic neoplasm. The differential diagnosis includes plasmablastic lymphoma and plasmablastic plasmacytoma/myeloma. Based on the overall phenotypic features and the clinical setting, plasmacytoma/myeloma is favored. Clinical correlation and hematologic evaluation is recommended ?  ?03/27/2019 Procedure  ? Technically successful CT-guided biopsy of sacral soft tissue mass. ?  ?04/04/2019 Initial Diagnosis  ? Multiple myeloma without remission ([place]) ?  ?04/11/2019 PET scan  ? IMPRESSION: ?1. Previously noted expansile lesions involving the L5 vertebra and sacrum are again noted and exhibit intense FDG uptake compatible with metabolically active disease. A third focus of increased uptake ?without corresponding CT abnormality is noted within the intertrochanteric portions of the proximal right femur. ?2. Small lucent lesion within the T3 vertebra is noted without corresponding FDG uptake. ?3. Asymmetric left bladder wall thickening, etiology indeterminate. ?4. Aortic Atherosclerosis (ICD10-I70.0). Lad coronary artery calcification. ?  ?04/12/2019 Cancer Staging  ? Staging form: Plasma Cell Myeloma and Plasma Cell Disorders, AJCC 8th Edition ?- Clinical stage from  04/12/2019: Beta-2-microglobulin (mg/L): 2, Albumin (g/dL): 3.6, ISS: Stage I, High-risk cytogenetics: Unknown, LDH: Unknown - Signed by Heath Lark, MD on 04/12/2019 ? ?  ?04/14/2019 Bone Marrow Biopsy  ? Bone Marrow, Aspirate,Biopsy, and Clot, right iliac bone ?- PLASMA CELL MYELOMA. ?  ?04/17/2019 - 07/11/2019 Chemotherapy  ? The patient had bortezemib,  revlimid and dexamethasone for chemotherapy treatment.  ? ?  ?08/17/2019 Bone Marrow Transplant  ? He received high dose melphalan followed by autologous stem cell transplant at [facility] ?  ?11/22/2019 Bone Marrow Biopsy  ? B

## 2022-01-20 NOTE — Assessment & Plan Note (Signed)
We discussed the importance of weight loss and healthy living ?I will reduce steroid treatment with each dose ?

## 2022-01-20 NOTE — Patient Instructions (Signed)
[facility]  Discharge Instructions: Thank you for choosing [facility] to provide your oncology and hematology care.   If you have a lab appointment with the Cancer Center, please go directly to the Cancer Center and check in at the registration area.   Wear comfortable clothing and clothing appropriate for easy access to any Portacath or PICC line.   We strive to give you quality time with your provider. You may need to reschedule your appointment if you arrive late (15 or more minutes).  Arriving late affects you and other patients whose appointments are after yours.  Also, if you miss three or more appointments without notifying the office, you may be dismissed from the clinic at the provider's discretion.      For prescription refill requests, have your pharmacy contact our office and allow 72 hours for refills to be completed.    Today you received the following chemotherapy and/or immunotherapy agents: Darzalex Faspro      To help prevent nausea and vomiting after your treatment, we encourage you to take your nausea medication as directed.  BELOW ARE SYMPTOMS THAT SHOULD BE REPORTED IMMEDIATELY: *FEVER GREATER THAN 100.4 F (38 C) OR HIGHER *CHILLS OR SWEATING *NAUSEA AND VOMITING THAT IS NOT CONTROLLED WITH YOUR NAUSEA MEDICATION *UNUSUAL SHORTNESS OF BREATH *UNUSUAL BRUISING OR BLEEDING *URINARY PROBLEMS (pain or burning when urinating, or frequent urination) *BOWEL PROBLEMS (unusual diarrhea, constipation, pain near the anus) TENDERNESS IN MOUTH AND THROAT WITH OR WITHOUT PRESENCE OF ULCERS (sore throat, sores in mouth, or a toothache) UNUSUAL RASH, SWELLING OR PAIN  UNUSUAL VAGINAL DISCHARGE OR ITCHING   Items with * indicate a potential emergency and should be followed up as soon as possible or go to the Emergency Department if any problems should occur.  Please show the CHEMOTHERAPY ALERT CARD or IMMUNOTHERAPY ALERT CARD at  check-in to the Emergency Department and triage nurse.  Should you have questions after your visit or need to cancel or reschedule your appointment, please contact [facility]  Dept: [phone]  and follow the prompts.  Office hours are 8:00 a.m. to 4:30 p.m. Monday - Friday. Please note that voicemails left after 4:00 p.m. may not be returned until the following business day.  We are closed weekends and major holidays. You have access to a nurse at all times for urgent questions. Please call the main number to the clinic Dept: [phone] and follow the prompts.   For any non-urgent questions, you may also contact your provider using MyChart. We now offer e-Visits for anyone 18 and older to request care online for non-urgent symptoms. For details visit [email or web address].   Also download the MyChart app! Go to the app store, search "MyChart", open the app, select [facility], and log in with your MyChart username and password.  Due to Covid, a mask is required upon entering the hospital/clinic. If you do not have a mask, one will be given to you upon arrival. For doctor visits, patients may have 1 support person aged 18 or older with them. For treatment visits, patients cannot have anyone with them due to current Covid guidelines and our immunocompromised population.  

## 2022-01-20 NOTE — Assessment & Plan Note (Signed)
I have reviewed recent myeloma panel with the patient ?He has excellent response to treatment with near complete remission ?He has significant pancytopenia recently but overall not symptomatic ?We have long discussion ?Ultimately, he is in agreement to have his treatments spaced out a little bit ?I plan to reduce the dexamethasone to 4 mg each time due to recent excessive weight gain ?He will complete his cycle 3 today and begin cycle 4 next week ?Afterwards, his treatment will be every 21 days ?He will continue acyclovir for antimicrobial prophylaxis ?He will continue calcium with vitamin D and Zometa every 3 months ?We will continue myeloma panel monitoring on the monthly basis ?

## 2022-01-27 ENCOUNTER — Other Ambulatory Visit: Payer: Self-pay

## 2022-01-27 ENCOUNTER — Inpatient Hospital Stay: Payer: Medicare Other

## 2022-01-27 VITALS — BP 136/82 | HR 80 | Temp 98.0°F | Resp 18

## 2022-01-27 DIAGNOSIS — C9000 Multiple myeloma not having achieved remission: Secondary | ICD-10-CM

## 2022-01-27 DIAGNOSIS — Z6830 Body mass index (BMI) 30.0-30.9, adult: Secondary | ICD-10-CM | POA: Diagnosis not present

## 2022-01-27 DIAGNOSIS — G8929 Other chronic pain: Secondary | ICD-10-CM | POA: Diagnosis not present

## 2022-01-27 DIAGNOSIS — Z5112 Encounter for antineoplastic immunotherapy: Secondary | ICD-10-CM | POA: Diagnosis not present

## 2022-01-27 DIAGNOSIS — E669 Obesity, unspecified: Secondary | ICD-10-CM | POA: Diagnosis not present

## 2022-01-27 DIAGNOSIS — D696 Thrombocytopenia, unspecified: Secondary | ICD-10-CM | POA: Diagnosis not present

## 2022-01-27 LAB — CBC WITH DIFFERENTIAL (CANCER CENTER ONLY)
Abs Immature Granulocytes: 0.01 10*3/uL (ref 0.00–0.07)
Basophils Absolute: 0 10*3/uL (ref 0.0–0.1)
Basophils Relative: 0 %
Eosinophils Absolute: 0.1 10*3/uL (ref 0.0–0.5)
Eosinophils Relative: 1 %
HCT: 37.1 % — ABNORMAL LOW (ref 39.0–52.0)
Hemoglobin: 13 g/dL (ref 13.0–17.0)
Immature Granulocytes: 0 %
Lymphocytes Relative: 46 %
Lymphs Abs: 2.4 10*3/uL (ref 0.7–4.0)
MCH: 36.7 pg — ABNORMAL HIGH (ref 26.0–34.0)
MCHC: 35 g/dL (ref 30.0–36.0)
MCV: 104.8 fL — ABNORMAL HIGH (ref 80.0–100.0)
Monocytes Absolute: 0.7 10*3/uL (ref 0.1–1.0)
Monocytes Relative: 14 %
Neutro Abs: 2.1 10*3/uL (ref 1.7–7.7)
Neutrophils Relative %: 39 %
Platelet Count: 124 10*3/uL — ABNORMAL LOW (ref 150–400)
RBC: 3.54 MIL/uL — ABNORMAL LOW (ref 4.22–5.81)
RDW: 13.8 % (ref 11.5–15.5)
WBC Count: 5.3 10*3/uL (ref 4.0–10.5)
nRBC: 0 % (ref 0.0–0.2)

## 2022-01-27 LAB — CMP (CANCER CENTER ONLY)
ALT: 27 U/L (ref 0–44)
AST: 22 U/L (ref 15–41)
Albumin: 3.8 g/dL (ref 3.5–5.0)
Alkaline Phosphatase: 58 U/L (ref 38–126)
Anion gap: 4 — ABNORMAL LOW (ref 5–15)
BUN: 17 mg/dL (ref 8–23)
CO2: 31 mmol/L (ref 22–32)
Calcium: 8.3 mg/dL — ABNORMAL LOW (ref 8.9–10.3)
Chloride: 106 mmol/L (ref 98–111)
Creatinine: 0.76 mg/dL (ref 0.61–1.24)
GFR, Estimated: >60 mL/min (ref >60)
Glucose, Bld: 117 mg/dL — ABNORMAL HIGH (ref 70–99)
Potassium: 4.3 mmol/L (ref 3.5–5.1)
Sodium: 141 mmol/L (ref 135–145)
Total Bilirubin: 0.9 mg/dL (ref 0.3–1.2)
Total Protein: 5.8 g/dL — ABNORMAL LOW (ref 6.5–8.1)

## 2022-01-27 MED ORDER — ACETAMINOPHEN 325 MG PO TABS
650.0000 mg | ORAL_TABLET | Freq: Once | ORAL | Status: AC
  Administered 2022-01-27: 650 mg via ORAL
  Filled 2022-01-27: qty 2

## 2022-01-27 MED ORDER — DIPHENHYDRAMINE HCL 25 MG PO CAPS
25.0000 mg | ORAL_CAPSULE | Freq: Once | ORAL | Status: AC
  Administered 2022-01-27: 25 mg via ORAL
  Filled 2022-01-27: qty 1

## 2022-01-27 MED ORDER — DEXAMETHASONE 4 MG PO TABS
4.0000 mg | ORAL_TABLET | Freq: Once | ORAL | Status: AC
  Administered 2022-01-27: 4 mg via ORAL
  Filled 2022-01-27: qty 1

## 2022-01-27 MED ORDER — BORTEZOMIB CHEMO SQ INJECTION 3.5 MG (2.5MG/ML)
1.0400 mg/m2 | Freq: Once | INTRAMUSCULAR | Status: AC
  Administered 2022-01-27: 2.25 mg via SUBCUTANEOUS
  Filled 2022-01-27: qty 0.9

## 2022-01-27 MED ORDER — DARATUMUMAB-HYALURONIDASE-FIHJ 1800-30000 MG-UT/15ML ~~LOC~~ SOLN
1800.0000 mg | Freq: Once | SUBCUTANEOUS | Status: AC
  Administered 2022-01-27: 1800 mg via SUBCUTANEOUS
  Filled 2022-01-27: qty 15

## 2022-02-05 ENCOUNTER — Telehealth: Payer: Self-pay

## 2022-02-05 ENCOUNTER — Other Ambulatory Visit: Payer: Self-pay | Admitting: Hematology and Oncology

## 2022-02-05 DIAGNOSIS — M7918 Myalgia, other site: Secondary | ICD-10-CM

## 2022-02-05 DIAGNOSIS — Z20822 Contact with and (suspected) exposure to covid-19: Secondary | ICD-10-CM | POA: Diagnosis not present

## 2022-02-05 MED ORDER — OXYCODONE HCL 5 MG PO TABS: 5.0000 mg | ORAL_TABLET | ORAL | 0 refills | Status: DC | PRN

## 2022-02-05 NOTE — Telephone Encounter (Signed)
Called and told Rx sent. He verbalized understanding. 

## 2022-02-05 NOTE — Telephone Encounter (Signed)
done

## 2022-02-05 NOTE — Telephone Encounter (Signed)
He called and requested a refill of Oxycodone Rx to [facility].  ?

## 2022-02-09 DIAGNOSIS — Z20822 Contact with and (suspected) exposure to covid-19: Secondary | ICD-10-CM | POA: Diagnosis not present

## 2022-02-17 ENCOUNTER — Inpatient Hospital Stay: Payer: Medicare Other

## 2022-02-17 ENCOUNTER — Inpatient Hospital Stay: Payer: Medicare Other | Attending: Hematology and Oncology | Admitting: Hematology and Oncology

## 2022-02-17 ENCOUNTER — Encounter: Payer: Self-pay | Admitting: Hematology and Oncology

## 2022-02-17 ENCOUNTER — Other Ambulatory Visit: Payer: Self-pay

## 2022-02-17 DIAGNOSIS — G8929 Other chronic pain: Secondary | ICD-10-CM | POA: Diagnosis not present

## 2022-02-17 DIAGNOSIS — Z5112 Encounter for antineoplastic immunotherapy: Secondary | ICD-10-CM | POA: Diagnosis not present

## 2022-02-17 DIAGNOSIS — I700 Atherosclerosis of aorta: Secondary | ICD-10-CM | POA: Insufficient documentation

## 2022-02-17 DIAGNOSIS — C9000 Multiple myeloma not having achieved remission: Secondary | ICD-10-CM

## 2022-02-17 DIAGNOSIS — I2510 Atherosclerotic heart disease of native coronary artery without angina pectoris: Secondary | ICD-10-CM | POA: Diagnosis not present

## 2022-02-17 DIAGNOSIS — R634 Abnormal weight loss: Secondary | ICD-10-CM | POA: Insufficient documentation

## 2022-02-17 DIAGNOSIS — M549 Dorsalgia, unspecified: Secondary | ICD-10-CM | POA: Insufficient documentation

## 2022-02-17 DIAGNOSIS — E669 Obesity, unspecified: Secondary | ICD-10-CM | POA: Diagnosis not present

## 2022-02-17 DIAGNOSIS — Z79899 Other long term (current) drug therapy: Secondary | ICD-10-CM | POA: Diagnosis not present

## 2022-02-17 DIAGNOSIS — Z7901 Long term (current) use of anticoagulants: Secondary | ICD-10-CM | POA: Insufficient documentation

## 2022-02-17 LAB — CBC WITH DIFFERENTIAL (CANCER CENTER ONLY)
Abs Immature Granulocytes: 0.04 10*3/uL (ref 0.00–0.07)
Basophils Absolute: 0 10*3/uL (ref 0.0–0.1)
Basophils Relative: 0 %
Eosinophils Absolute: 0.1 10*3/uL (ref 0.0–0.5)
Eosinophils Relative: 1 %
HCT: 39.9 % (ref 39.0–52.0)
Hemoglobin: 13.9 g/dL (ref 13.0–17.0)
Immature Granulocytes: 1 %
Lymphocytes Relative: 39 %
Lymphs Abs: 2.5 10*3/uL (ref 0.7–4.0)
MCH: 36.5 pg — ABNORMAL HIGH (ref 26.0–34.0)
MCHC: 34.8 g/dL (ref 30.0–36.0)
MCV: 104.7 fL — ABNORMAL HIGH (ref 80.0–100.0)
Monocytes Absolute: 0.9 10*3/uL (ref 0.1–1.0)
Monocytes Relative: 13 %
Neutro Abs: 2.9 10*3/uL (ref 1.7–7.7)
Neutrophils Relative %: 46 %
Platelet Count: 179 10*3/uL (ref 150–400)
RBC: 3.81 MIL/uL — ABNORMAL LOW (ref 4.22–5.81)
RDW: 13.1 % (ref 11.5–15.5)
WBC Count: 6.5 10*3/uL (ref 4.0–10.5)
nRBC: 0 % (ref 0.0–0.2)

## 2022-02-17 LAB — CMP (CANCER CENTER ONLY)
ALT: 27 U/L (ref 0–44)
AST: 19 U/L (ref 15–41)
Albumin: 3.9 g/dL (ref 3.5–5.0)
Alkaline Phosphatase: 58 U/L (ref 38–126)
Anion gap: 6 (ref 5–15)
BUN: 17 mg/dL (ref 8–23)
CO2: 32 mmol/L (ref 22–32)
Calcium: 9 mg/dL (ref 8.9–10.3)
Chloride: 101 mmol/L (ref 98–111)
Creatinine: 0.79 mg/dL (ref 0.61–1.24)
GFR, Estimated: >60 mL/min (ref >60)
Glucose, Bld: 95 mg/dL (ref 70–99)
Potassium: 4.2 mmol/L (ref 3.5–5.1)
Sodium: 139 mmol/L (ref 135–145)
Total Bilirubin: 0.6 mg/dL (ref 0.3–1.2)
Total Protein: 6.5 g/dL (ref 6.5–8.1)

## 2022-02-17 MED ORDER — BORTEZOMIB CHEMO SQ INJECTION 3.5 MG (2.5MG/ML)
1.0400 mg/m2 | Freq: Once | INTRAMUSCULAR | Status: AC
  Administered 2022-02-17: 2.25 mg via SUBCUTANEOUS
  Filled 2022-02-17: qty 0.9

## 2022-02-17 MED ORDER — DARATUMUMAB-HYALURONIDASE-FIHJ 1800-30000 MG-UT/15ML ~~LOC~~ SOLN
1800.0000 mg | Freq: Once | SUBCUTANEOUS | Status: AC
  Administered 2022-02-17: 1800 mg via SUBCUTANEOUS
  Filled 2022-02-17: qty 15

## 2022-02-17 MED ORDER — ACETAMINOPHEN 325 MG PO TABS
650.0000 mg | ORAL_TABLET | Freq: Once | ORAL | Status: AC
  Administered 2022-02-17: 650 mg via ORAL
  Filled 2022-02-17: qty 2

## 2022-02-17 MED ORDER — DIPHENHYDRAMINE HCL 25 MG PO CAPS
25.0000 mg | ORAL_CAPSULE | Freq: Once | ORAL | Status: AC
  Administered 2022-02-17: 25 mg via ORAL
  Filled 2022-02-17: qty 1

## 2022-02-17 MED ORDER — DEXAMETHASONE 4 MG PO TABS
4.0000 mg | ORAL_TABLET | Freq: Once | ORAL | Status: AC
  Administered 2022-02-17: 4 mg via ORAL
  Filled 2022-02-17: qty 1

## 2022-02-17 NOTE — Progress Notes (Signed)
[facility] ?OFFICE PROGRESS NOTE ? ?Patient Care Team: ?Denita Lung, MD as PCP - General (Family Medicine) ?Heath Lark, MD as Consulting Physician (Hematology and Oncology) ? ?ASSESSMENT & PLAN:  ?Multiple myeloma without remission ([place]) ?Myeloma panel from today is pending ?His recent myeloma panel showed excellent response to treatment ?His treatment has been changed to every 3 weeks and the dose of dexamethasone has been reduced ?If repeat myeloma panel show excellent response to treatment, I plan to discontinue dexamethasone ?He will continue acyclovir for antimicrobial prophylaxis ?He will continue calcium with vitamin D and Zometa every 3 months ?We will continue myeloma panel monitoring on the monthly basis ? ?Hypocalcemia ?He has intermittent hypocalcemia ?I recommend increase vitamin D intake ?If his hypocalcemia persistent, might have to space out Zometa to every 6 months ? ?Obesity (BMI 30-39.9) ?The patient struggle with weight gain ?I gave him a flow sheet to document oral intake so that I can help him with lifestyle changes and dietary modification ?We will discuss that in his next visit ? ?No orders of the defined types were placed in this encounter. ? ? ?All questions were answered. The patient knows to call the clinic with any problems, questions or concerns. ?The total time spent in the appointment was 20 minutes encounter with patients including review of chart and various tests results, discussions about plan of care and coordination of care plan ?  ?Heath Lark, MD ?02/17/2022 12:49 PM ? ?INTERVAL HISTORY: ?Please see below for problem oriented charting. ?he returns for treatment follow-up on dexamethasone, Velcade and daratumumab for recurrent melanoma ?Since last time I saw him, he is doing well ?He is struggling to lose weight ?His chronic back pain is stable ?No worsening peripheral neuropathy ? ?REVIEW OF SYSTEMS:   ?Constitutional: Denies fevers, chills or abnormal weight  loss ?Eyes: Denies blurriness of vision ?Ears, nose, mouth, throat, and face: Denies mucositis or sore throat ?Respiratory: Denies cough, dyspnea or wheezes ?Cardiovascular: Denies palpitation, chest discomfort or lower extremity swelling ?Gastrointestinal:  Denies nausea, heartburn or change in bowel habits ?Skin: Denies abnormal skin rashes ?Lymphatics: Denies new lymphadenopathy or easy bruising ?Neurological:Denies numbness, tingling or new weaknesses ?Behavioral/Psych: Mood is stable, no new changes  ?All other systems were reviewed with the patient and are negative. ? ?I have reviewed the past medical history, past surgical history, social history and family history with the patient and they are unchanged from previous note. ? ?ALLERGIES:  has No Known Allergies. ? ?MEDICATIONS:  ?Current Outpatient Medications  ?Medication Sig Dispense Refill  ? acetaminophen (TYLENOL) 650 MG CR tablet Take 650 mg by mouth every 8 (eight) hours as needed for pain.    ? atorvastatin (LIPITOR) 20 MG tablet Take 1 tablet (20 mg total) by mouth daily. 90 tablet 4  ? calcium carbonate (TUMS - DOSED IN MG ELEMENTAL CALCIUM) 500 MG chewable tablet Chew 1-2 tablets by mouth daily as needed for heartburn.    ? Cholecalciferol (VITAMIN D) 50 MCG (2000 UT) tablet Take 2,000 Units by mouth daily.    ? folic acid (FOLVITE) 161 MCG tablet Take 400 mcg by mouth daily.    ? losartan-hydrochlorothiazide (HYZAAR) 100-12.5 MG tablet TAKE 1 TABLET BY MOUTH DAILY 90 tablet 0  ? Multiple Vitamin (MULTI-VITAMIN) tablet Take 1 tablet by mouth daily.    ? ondansetron (ZOFRAN) 4 MG tablet Take 1 tablet (4 mg total) by mouth every 6 (six) hours as needed for nausea. 20 tablet 0  ? ondansetron (ZOFRAN)  8 MG tablet Take 1 tablet (8 mg total) by mouth 2 (two) times daily as needed (Nausea or vomiting). 30 tablet 1  ? oxyCODONE (ROXICODONE) 5 MG immediate release tablet Take 1 tablet (5 mg total) by mouth every 4 (four) hours as needed for severe pain. 60  tablet 0  ? Polyethyl Glycol-Propyl Glycol (SYSTANE OP) Place 1 drop into both eyes daily as needed (dry eyes).    ? prochlorperazine (COMPAZINE) 10 MG tablet Take 1 tablet (10 mg total) by mouth every 6 (six) hours as needed (Nausea or vomiting). 30 tablet 1  ? rivaroxaban (XARELTO) 20 MG TABS tablet Take 1 tablet (20 mg total) by mouth daily with supper. 90 tablet 1  ? vitamin B-12 (CYANOCOBALAMIN) 1000 MCG tablet Take 1,000 mcg by mouth daily.    ? ?No current facility-administered medications for this visit.  ? ? ?SUMMARY OF ONCOLOGIC HISTORY: ?Oncology History  ?Multiple myeloma without remission ([place])  ?03/20/2019 Imaging  ? 1. Tumor involving the L5 and S1 and S2 segments of the spine as described with mass effects upon the left L4, L5, S1 and more distal left sacral nerves as described above. ?2. Does the patient have a history of malignancy? ?3. Multiple plasmacytomas and metastatic disease could give this appearance ?  ?03/27/2019 Pathology Results  ? Soft Tissue Needle Core Biopsy, sacral mass ?- PLASMABLASTIC NEOPLASM. ?- SEE COMMENT. ?Microscopic Comment ?The sections show needle core biopsy fragments of soft tissue densely infiltrated by a relatively monomorphic infiltrate of atypical plasmacytoid cells characterized by vesicular or partially clumped chromatin and prominent nucleoli. This is associated with scattered mitosis. A battery of immunohistochemical stains was performed and show that the atypical plasmacytoid cells are positive for CD138, CD43 and cytoplasmic kappa. There is also weak positivity for CD56 and partial variable positivity for cyclin D1. The atypical plasmacytoid cells are negative for cytoplasmic lambda, CD10, PAX5, CD79a, CD20, CD3, CD5, CD34, EBV (ISH) and mostly negative for LCA. The overall morphologic and histologic features are most compatible with a plasmablastic neoplasm. The differential diagnosis includes plasmablastic lymphoma and plasmablastic plasmacytoma/myeloma.  Based on the overall phenotypic features and the clinical setting, plasmacytoma/myeloma is favored. Clinical correlation and hematologic evaluation is recommended ?  ?03/27/2019 Procedure  ? Technically successful CT-guided biopsy of sacral soft tissue mass. ?  ?04/04/2019 Initial Diagnosis  ? Multiple myeloma without remission ([place]) ?  ?04/11/2019 PET scan  ? IMPRESSION: ?1. Previously noted expansile lesions involving the L5 vertebra and sacrum are again noted and exhibit intense FDG uptake compatible with metabolically active disease. A third focus of increased uptake ?without corresponding CT abnormality is noted within the intertrochanteric portions of the proximal right femur. ?2. Small lucent lesion within the T3 vertebra is noted without corresponding FDG uptake. ?3. Asymmetric left bladder wall thickening, etiology indeterminate. ?4. Aortic Atherosclerosis (ICD10-I70.0). Lad coronary artery calcification. ?  ?04/12/2019 Cancer Staging  ? Staging form: Plasma Cell Myeloma and Plasma Cell Disorders, AJCC 8th Edition ?- Clinical stage from 04/12/2019: Beta-2-microglobulin (mg/L): 2, Albumin (g/dL): 3.6, ISS: Stage I, High-risk cytogenetics: Unknown, LDH: Unknown - Signed by Heath Lark, MD on 04/12/2019 ? ?  ?04/14/2019 Bone Marrow Biopsy  ? Bone Marrow, Aspirate,Biopsy, and Clot, right iliac bone ?- PLASMA CELL MYELOMA. ?  ?04/17/2019 - 07/11/2019 Chemotherapy  ? The patient had bortezemib, revlimid and dexamethasone for chemotherapy treatment.  ? ?  ?08/17/2019 Bone Marrow Transplant  ? He received high dose melphalan followed by autologous stem cell transplant at [facility] ?  ?  11/22/2019 Bone Marrow Biopsy  ? BONE MARROW biopsy at [facility]:  ?     Mild monoclonal plasmacytosis (approximately 2%), kappa light chain restricted.  ?  ?11/22/2019 PET scan  ? PET CT at [facility] ?1.  Similar size and appearance of mildly hypermetabolic lytic lesion in the L5 vertebral body and posterior elements.  ?2.  Otherwise, no  substantial FDG uptake compared to background bone marrow in the additional osseous lucent lesions.  ?  ?12/20/2019 - 05/09/2020 Radiation Therapy  ? Radiation Treatment Dates: 12/20/2019 through 01/08/2020 ?Site

## 2022-02-17 NOTE — Assessment & Plan Note (Signed)
He has intermittent hypocalcemia ?I recommend increase vitamin D intake ?If his hypocalcemia persistent, might have to space out Zometa to every 6 months ?

## 2022-02-17 NOTE — Progress Notes (Signed)
Per Dr. Alvy Bimler ok to proceed with pre-meds while waiting for final CMP results ?

## 2022-02-17 NOTE — Assessment & Plan Note (Signed)
Myeloma panel from today is pending ?His recent myeloma panel showed excellent response to treatment ?His treatment has been changed to every 3 weeks and the dose of dexamethasone has been reduced ?If repeat myeloma panel show excellent response to treatment, I plan to discontinue dexamethasone ?He will continue acyclovir for antimicrobial prophylaxis ?He will continue calcium with vitamin D and Zometa every 3 months ?We will continue myeloma panel monitoring on the monthly basis ?

## 2022-02-17 NOTE — Assessment & Plan Note (Signed)
The patient struggle with weight gain ?I gave him a flow sheet to document oral intake so that I can help him with lifestyle changes and dietary modification ?We will discuss that in his next visit ?

## 2022-02-17 NOTE — Patient Instructions (Signed)
[facility]  Discharge Instructions: ?Thank you for choosing [facility] to provide your oncology and hematology care.  ? ?If you have a lab appointment with the [facility], please go directly to the [facility] and check in at the registration area. ?  ?Wear comfortable clothing and clothing appropriate for easy access to any Portacath or PICC line.  ? ?We strive to give you quality time with your provider. You may need to reschedule your appointment if you arrive late (15 or more minutes).  Arriving late affects you and other patients whose appointments are after yours.  Also, if you miss three or more appointments without notifying the office, you may be dismissed from the clinic at the provider?s discretion.    ?  ?For prescription refill requests, have your pharmacy contact our office and allow 72 hours for refills to be completed.   ? ?Today you received the following chemotherapy and/or immunotherapy agents: Velcade, Darzalex Faspro    ?  ?To help prevent nausea and vomiting after your treatment, we encourage you to take your nausea medication as directed. ? ?BELOW ARE SYMPTOMS THAT SHOULD BE REPORTED IMMEDIATELY: ?*FEVER GREATER THAN 100.4 F (38 ?C) OR HIGHER ?*CHILLS OR SWEATING ?*NAUSEA AND VOMITING THAT IS NOT CONTROLLED WITH YOUR NAUSEA MEDICATION ?*UNUSUAL SHORTNESS OF BREATH ?*UNUSUAL BRUISING OR BLEEDING ?*URINARY PROBLEMS (pain or burning when urinating, or frequent urination) ?*BOWEL PROBLEMS (unusual diarrhea, constipation, pain near the anus) ?TENDERNESS IN MOUTH AND THROAT WITH OR WITHOUT PRESENCE OF ULCERS (sore throat, sores in mouth, or a toothache) ?UNUSUAL RASH, SWELLING OR PAIN  ?UNUSUAL VAGINAL DISCHARGE OR ITCHING  ? ?Items with * indicate a potential emergency and should be followed up as soon as possible or go to the Emergency Department if any problems should occur. ? ?Please show the CHEMOTHERAPY ALERT CARD or IMMUNOTHERAPY ALERT CARD  at check-in to the Emergency Department and triage nurse. ? ?Should you have questions after your visit or need to cancel or reschedule your appointment, please contact [facility]  Dept: [phone]  and follow the prompts.  Office hours are 8:00 a.m. to 4:30 p.m. Monday - Friday. Please note that voicemails left after 4:00 p.m. may not be returned until the following business day.  We are closed weekends and major holidays. You have access to a nurse at all times for urgent questions. Please call the main number to the clinic Dept: [phone] and follow the prompts. ? ? ?For any non-urgent questions, you may also contact your provider using MyChart. We now offer e-Visits for anyone 74 and older to request care online for non-urgent symptoms. For details visit [email or web address]. ?  ?Also download the MyChart app! Go to the app store, search "MyChart", open the app, select [facility], and log in with your MyChart username and password. ? ?Due to Covid, a mask is required upon entering the hospital/clinic. If you do not have a mask, one will be given to you upon arrival. For doctor visits, patients may have 1 support person aged 63 or older with them. For treatment visits, patients cannot have anyone with them due to current Covid guidelines and our immunocompromised population.  ? ?

## 2022-02-18 LAB — KAPPA/LAMBDA LIGHT CHAINS
Kappa free light chain: 91.8 mg/L — ABNORMAL HIGH (ref 3.3–19.4)
Kappa, lambda light chain ratio: 7.98 — ABNORMAL HIGH (ref 0.26–1.65)
Lambda free light chains: 11.5 mg/L (ref 5.7–26.3)

## 2022-02-20 ENCOUNTER — Telehealth: Payer: Self-pay

## 2022-02-20 LAB — MULTIPLE MYELOMA PANEL, SERUM
Albumin SerPl Elph-Mcnc: 3.4 g/dL (ref 2.9–4.4)
Albumin/Glob SerPl: 1.4 (ref 0.7–1.7)
Alpha 1: 0.3 g/dL (ref 0.0–0.4)
Alpha2 Glob SerPl Elph-Mcnc: 1 g/dL (ref 0.4–1.0)
B-Globulin SerPl Elph-Mcnc: 0.8 g/dL (ref 0.7–1.3)
Gamma Glob SerPl Elph-Mcnc: 0.5 g/dL (ref 0.4–1.8)
Globulin, Total: 2.6 g/dL (ref 2.2–3.9)
IgA: 16 mg/dL — ABNORMAL LOW (ref 61–437)
IgG (Immunoglobin G), Serum: 553 mg/dL — ABNORMAL LOW (ref 603–1613)
IgM (Immunoglobulin M), Srm: 15 mg/dL — ABNORMAL LOW (ref 20–172)
M Protein SerPl Elph-Mcnc: 0.2 g/dL — ABNORMAL HIGH
Total Protein ELP: 6 g/dL (ref 6.0–8.5)

## 2022-02-20 NOTE — Telephone Encounter (Signed)
Called and left below message. Ask him to call the office back for questions. 

## 2022-02-20 NOTE — Telephone Encounter (Signed)
-----   Message from Heath Lark, MD sent at 02/20/2022  3:44 PM EDT ----- Pls call him M protein went up just a bit but overall everything is stable No change in treatment

## 2022-03-03 ENCOUNTER — Telehealth: Payer: Self-pay

## 2022-03-03 ENCOUNTER — Other Ambulatory Visit: Payer: Self-pay | Admitting: Hematology and Oncology

## 2022-03-03 DIAGNOSIS — G8929 Other chronic pain: Secondary | ICD-10-CM

## 2022-03-03 MED ORDER — OXYCODONE HCL 5 MG PO TABS: 5.0000 mg | ORAL_TABLET | ORAL | 0 refills | Status: DC | PRN

## 2022-03-03 NOTE — Telephone Encounter (Signed)
He called and left a message requesting Oxycodone refill.

## 2022-03-03 NOTE — Telephone Encounter (Signed)
done

## 2022-03-03 NOTE — Telephone Encounter (Signed)
Called and told Rx sent. He verbalized understanding. 

## 2022-03-10 ENCOUNTER — Encounter: Payer: Self-pay | Admitting: Hematology and Oncology

## 2022-03-10 ENCOUNTER — Inpatient Hospital Stay: Payer: Medicare Other | Attending: Hematology and Oncology

## 2022-03-10 ENCOUNTER — Other Ambulatory Visit: Payer: Self-pay

## 2022-03-10 ENCOUNTER — Inpatient Hospital Stay: Payer: Medicare Other

## 2022-03-10 ENCOUNTER — Inpatient Hospital Stay (HOSPITAL_BASED_OUTPATIENT_CLINIC_OR_DEPARTMENT_OTHER): Payer: Medicare Other | Admitting: Hematology and Oncology

## 2022-03-10 DIAGNOSIS — Z9484 Stem cells transplant status: Secondary | ICD-10-CM | POA: Insufficient documentation

## 2022-03-10 DIAGNOSIS — M549 Dorsalgia, unspecified: Secondary | ICD-10-CM | POA: Insufficient documentation

## 2022-03-10 DIAGNOSIS — Z923 Personal history of irradiation: Secondary | ICD-10-CM | POA: Insufficient documentation

## 2022-03-10 DIAGNOSIS — C9000 Multiple myeloma not having achieved remission: Secondary | ICD-10-CM

## 2022-03-10 DIAGNOSIS — Z5112 Encounter for antineoplastic immunotherapy: Secondary | ICD-10-CM | POA: Insufficient documentation

## 2022-03-10 DIAGNOSIS — G8929 Other chronic pain: Secondary | ICD-10-CM | POA: Insufficient documentation

## 2022-03-10 DIAGNOSIS — Z9481 Bone marrow transplant status: Secondary | ICD-10-CM | POA: Insufficient documentation

## 2022-03-10 DIAGNOSIS — Z9221 Personal history of antineoplastic chemotherapy: Secondary | ICD-10-CM | POA: Insufficient documentation

## 2022-03-10 DIAGNOSIS — Z79899 Other long term (current) drug therapy: Secondary | ICD-10-CM | POA: Insufficient documentation

## 2022-03-10 DIAGNOSIS — M7918 Myalgia, other site: Secondary | ICD-10-CM

## 2022-03-10 DIAGNOSIS — Z7901 Long term (current) use of anticoagulants: Secondary | ICD-10-CM | POA: Diagnosis not present

## 2022-03-10 LAB — CMP (CANCER CENTER ONLY)
ALT: 28 U/L (ref 0–44)
AST: 20 U/L (ref 15–41)
Albumin: 4.3 g/dL (ref 3.5–5.0)
Alkaline Phosphatase: 56 U/L (ref 38–126)
Anion gap: 4 — ABNORMAL LOW (ref 5–15)
BUN: 23 mg/dL (ref 8–23)
CO2: 31 mmol/L (ref 22–32)
Calcium: 9.2 mg/dL (ref 8.9–10.3)
Chloride: 103 mmol/L (ref 98–111)
Creatinine: 0.78 mg/dL (ref 0.61–1.24)
GFR, Estimated: >60 mL/min (ref >60)
Glucose, Bld: 98 mg/dL (ref 70–99)
Potassium: 4.4 mmol/L (ref 3.5–5.1)
Sodium: 138 mmol/L (ref 135–145)
Total Bilirubin: 0.9 mg/dL (ref 0.3–1.2)
Total Protein: 6.5 g/dL (ref 6.5–8.1)

## 2022-03-10 LAB — CBC WITH DIFFERENTIAL (CANCER CENTER ONLY)
Abs Immature Granulocytes: 0.01 10*3/uL (ref 0.00–0.07)
Basophils Absolute: 0 10*3/uL (ref 0.0–0.1)
Basophils Relative: 0 %
Eosinophils Absolute: 0.1 10*3/uL (ref 0.0–0.5)
Eosinophils Relative: 1 %
HCT: 41.1 % (ref 39.0–52.0)
Hemoglobin: 14.1 g/dL (ref 13.0–17.0)
Immature Granulocytes: 0 %
Lymphocytes Relative: 47 %
Lymphs Abs: 2.7 10*3/uL (ref 0.7–4.0)
MCH: 36.4 pg — ABNORMAL HIGH (ref 26.0–34.0)
MCHC: 34.3 g/dL (ref 30.0–36.0)
MCV: 106.2 fL — ABNORMAL HIGH (ref 80.0–100.0)
Monocytes Absolute: 0.9 10*3/uL (ref 0.1–1.0)
Monocytes Relative: 15 %
Neutro Abs: 2.1 10*3/uL (ref 1.7–7.7)
Neutrophils Relative %: 37 %
Platelet Count: 171 10*3/uL (ref 150–400)
RBC: 3.87 MIL/uL — ABNORMAL LOW (ref 4.22–5.81)
RDW: 13.3 % (ref 11.5–15.5)
WBC Count: 5.8 10*3/uL (ref 4.0–10.5)
nRBC: 0 % (ref 0.0–0.2)

## 2022-03-10 MED ORDER — DIPHENHYDRAMINE HCL 25 MG PO CAPS
25.0000 mg | ORAL_CAPSULE | Freq: Once | ORAL | Status: AC
  Administered 2022-03-10: 25 mg via ORAL
  Filled 2022-03-10: qty 1

## 2022-03-10 MED ORDER — DARATUMUMAB-HYALURONIDASE-FIHJ 1800-30000 MG-UT/15ML ~~LOC~~ SOLN
1800.0000 mg | Freq: Once | SUBCUTANEOUS | Status: AC
  Administered 2022-03-10: 1800 mg via SUBCUTANEOUS
  Filled 2022-03-10: qty 15

## 2022-03-10 MED ORDER — ACETAMINOPHEN 325 MG PO TABS
650.0000 mg | ORAL_TABLET | Freq: Once | ORAL | Status: AC
  Administered 2022-03-10: 650 mg via ORAL
  Filled 2022-03-10: qty 2

## 2022-03-10 MED ORDER — DEXAMETHASONE 4 MG PO TABS
4.0000 mg | ORAL_TABLET | Freq: Once | ORAL | Status: AC
  Administered 2022-03-10: 4 mg via ORAL
  Filled 2022-03-10: qty 1

## 2022-03-10 MED ORDER — BORTEZOMIB CHEMO SQ INJECTION 3.5 MG (2.5MG/ML)
1.0400 mg/m2 | Freq: Once | INTRAMUSCULAR | Status: AC
  Administered 2022-03-10: 2.25 mg via SUBCUTANEOUS
  Filled 2022-03-10: qty 0.9

## 2022-03-10 NOTE — Assessment & Plan Note (Signed)
He has persistent mild back pain He will continue the same prescribed pain medicine

## 2022-03-10 NOTE — Progress Notes (Signed)
[facility] OFFICE PROGRESS NOTE  Patient Care Team: Denita Lung, MD as PCP - General (Family Medicine) Heath Lark, MD as Consulting Physician (Hematology and Oncology)  ASSESSMENT & PLAN:  Multiple myeloma without remission [facility]) Myeloma panel from today is pending His recent myeloma panel showed excellent response to treatment but with fluctuation of M protein and light chain His treatment has been changed to every 3 weeks and the dose of dexamethasone has been reduced He will continue acyclovir for antimicrobial prophylaxis He will continue calcium with vitamin D and Zometa every 6 months due to history of bone pain We will continue myeloma panel monitoring on the monthly basis  Chronic musculoskeletal pain He has persistent mild back pain He will continue the same prescribed pain medicine  No orders of the defined types were placed in this encounter.   All questions were answered. The patient knows to call the clinic with any problems, questions or concerns. The total time spent in the appointment was 20 minutes encounter with patients including review of chart and various tests results, discussions about plan of care and coordination of care plan   Heath Lark, MD 03/10/2022 3:00 PM  INTERVAL HISTORY: Please see below for problem oriented charting. he returns for treatment follow-up on treatment for recurrent multiple myeloma Since last time I saw him, he is doing well He has lost weight since I saw him His chronic back pain is stable No worsening peripheral neuropathy  REVIEW OF SYSTEMS:   Constitutional: Denies fevers, chills or abnormal weight loss Eyes: Denies blurriness of vision Ears, nose, mouth, throat, and face: Denies mucositis or sore throat Respiratory: Denies cough, dyspnea or wheezes Cardiovascular: Denies palpitation, chest discomfort or lower extremity swelling Gastrointestinal:  Denies nausea, heartburn or change in bowel habits Skin:  Denies abnormal skin rashes Lymphatics: Denies new lymphadenopathy or easy bruising Neurological:Denies numbness, tingling or new weaknesses Behavioral/Psych: Mood is stable, no new changes  All other systems were reviewed with the patient and are negative.  I have reviewed the past medical history, past surgical history, social history and family history with the patient and they are unchanged from previous note.  ALLERGIES:  has No Known Allergies.  MEDICATIONS:  Current Outpatient Medications  Medication Sig Dispense Refill   acetaminophen (TYLENOL) 650 MG CR tablet Take 650 mg by mouth every 8 (eight) hours as needed for pain.     atorvastatin (LIPITOR) 20 MG tablet Take 1 tablet (20 mg total) by mouth daily. 90 tablet 4   calcium carbonate (TUMS - DOSED IN MG ELEMENTAL CALCIUM) 500 MG chewable tablet Chew 1-2 tablets by mouth daily as needed for heartburn.     Cholecalciferol (VITAMIN D) 50 MCG (2000 UT) tablet Take 2,000 Units by mouth daily.     folic acid (FOLVITE) 782 MCG tablet Take 400 mcg by mouth daily.     losartan-hydrochlorothiazide (HYZAAR) 100-12.5 MG tablet TAKE 1 TABLET BY MOUTH DAILY 90 tablet 0   Multiple Vitamin (MULTI-VITAMIN) tablet Take 1 tablet by mouth daily.     ondansetron (ZOFRAN) 4 MG tablet Take 1 tablet (4 mg total) by mouth every 6 (six) hours as needed for nausea. 20 tablet 0   ondansetron (ZOFRAN) 8 MG tablet Take 1 tablet (8 mg total) by mouth 2 (two) times daily as needed (Nausea or vomiting). 30 tablet 1   oxyCODONE (ROXICODONE) 5 MG immediate release tablet Take 1 tablet (5 mg total) by mouth every 4 (four) hours as needed for  severe pain. 60 tablet 0   Polyethyl Glycol-Propyl Glycol (SYSTANE OP) Place 1 drop into both eyes daily as needed (dry eyes).     prochlorperazine (COMPAZINE) 10 MG tablet Take 1 tablet (10 mg total) by mouth every 6 (six) hours as needed (Nausea or vomiting). 30 tablet 1   rivaroxaban (XARELTO) 20 MG TABS tablet Take 1 tablet  (20 mg total) by mouth daily with supper. 90 tablet 1   vitamin B-12 (CYANOCOBALAMIN) 1000 MCG tablet Take 1,000 mcg by mouth daily.     No current facility-administered medications for this visit.    SUMMARY OF ONCOLOGIC HISTORY: Oncology History  Multiple myeloma without remission ([place])  03/20/2019 Imaging   1. Tumor involving the L5 and S1 and S2 segments of the spine as described with mass effects upon the left L4, L5, S1 and more distal left sacral nerves as described above. 2. Does the patient have a history of malignancy? 3. Multiple plasmacytomas and metastatic disease could give this appearance   03/27/2019 Pathology Results   Soft Tissue Needle Core Biopsy, sacral mass - PLASMABLASTIC NEOPLASM. - SEE COMMENT. Microscopic Comment The sections show needle core biopsy fragments of soft tissue densely infiltrated by a relatively monomorphic infiltrate of atypical plasmacytoid cells characterized by vesicular or partially clumped chromatin and prominent nucleoli. This is associated with scattered mitosis. A battery of immunohistochemical stains was performed and show that the atypical plasmacytoid cells are positive for CD138, CD43 and cytoplasmic kappa. There is also weak positivity for CD56 and partial variable positivity for cyclin D1. The atypical plasmacytoid cells are negative for cytoplasmic lambda, CD10, PAX5, CD79a, CD20, CD3, CD5, CD34, EBV (ISH) and mostly negative for LCA. The overall morphologic and histologic features are most compatible with a plasmablastic neoplasm. The differential diagnosis includes plasmablastic lymphoma and plasmablastic plasmacytoma/myeloma. Based on the overall phenotypic features and the clinical setting, plasmacytoma/myeloma is favored. Clinical correlation and hematologic evaluation is recommended   03/27/2019 Procedure   Technically successful CT-guided biopsy of sacral soft tissue mass.   04/04/2019 Initial Diagnosis   Multiple myeloma without  remission ([place])   04/11/2019 PET scan   IMPRESSION: 1. Previously noted expansile lesions involving the L5 vertebra and sacrum are again noted and exhibit intense FDG uptake compatible with metabolically active disease. A third focus of increased uptake without corresponding CT abnormality is noted within the intertrochanteric portions of the proximal right femur. 2. Small lucent lesion within the T3 vertebra is noted without corresponding FDG uptake. 3. Asymmetric left bladder wall thickening, etiology indeterminate. 4. Aortic Atherosclerosis (ICD10-I70.0). Lad coronary artery calcification.   04/12/2019 Cancer Staging   Staging form: Plasma Cell Myeloma and Plasma Cell Disorders, AJCC 8th Edition - Clinical stage from 04/12/2019: Beta-2-microglobulin (mg/L): 2, Albumin (g/dL): 3.6, ISS: Stage I, High-risk cytogenetics: Unknown, LDH: Unknown - Signed by Heath Lark, MD on 04/12/2019    04/14/2019 Bone Marrow Biopsy   Bone Marrow, Aspirate,Biopsy, and Clot, right iliac bone - PLASMA CELL MYELOMA.   04/17/2019 - 07/11/2019 Chemotherapy   The patient had bortezemib, revlimid and dexamethasone for chemotherapy treatment.     08/17/2019 Bone Marrow Transplant   He received high dose melphalan followed by autologous stem cell transplant at [facility]   11/22/2019 Bone Marrow Biopsy   BONE MARROW biopsy at [facility]:       Mild monoclonal plasmacytosis (approximately 2%), kappa light chain restricted.    11/22/2019 PET scan   PET CT at [facility] 1.  Similar size and appearance  of mildly hypermetabolic lytic lesion in the L5 vertebral body and posterior elements.  2.  Otherwise, no substantial FDG uptake compared to background bone marrow in the additional osseous lucent lesions.    12/20/2019 - 05/09/2020 Radiation Therapy   Radiation Treatment Dates: 12/20/2019 through 01/08/2020 Site Technique Total Dose (Gy) Dose per Fx (Gy) Completed Fx Beam Energies  Lumbar Spine: Spine 3D 35/35 2.5 14/14 15X         02/29/2020 PET scan   1. Similar size and appearance of expansile lytic lesion in the L5 vertebral body and posterior elements with minimal FDG activity.  2. Otherwise, no additional osseous lucent lesions with FDG uptake above background bone marrow   05/31/2020 -  Chemotherapy   The patient had Revlimid for chemotherapy treatment.     11/25/2021 -  Chemotherapy   Patient is on Treatment Plan : MYELOMA RELAPSED / REFRACTORY Daratumumab SQ + Bortezomib + Dexamethasone (DaraVd) q21d / Daratumumab SQ q28d         PHYSICAL EXAMINATION: ECOG PERFORMANCE STATUS: 0 - Asymptomatic  Vitals:   03/10/22 1209  BP: 133/80  Pulse: 80  Resp: 18  Temp: 98.6 F (37 C)  SpO2: 96%   Filed Weights   03/10/22 1209  Weight: 231 lb 9.6 oz (105.1 kg)    GENERAL:alert, no distress and comfortable NEURO: alert & oriented x 3 with fluent speech, no focal motor/sensory deficits  LABORATORY DATA:  I have reviewed the data as listed    Component Value Date/Time   NA 138 03/10/2022 1141   NA 139 10/11/2018 1115   NA 139 04/05/2014 0849   K 4.4 03/10/2022 1141   K 4.4 04/05/2014 0849   CL 103 03/10/2022 1141   CO2 31 03/10/2022 1141   CO2 23 04/05/2014 0849   GLUCOSE 98 03/10/2022 1141   GLUCOSE 109 04/05/2014 0849   BUN 23 03/10/2022 1141   BUN 23 10/11/2018 1115   BUN 16.8 04/05/2014 0849   CREATININE 0.78 03/10/2022 1141   CREATININE 0.91 07/29/2017 1004   CREATININE 0.9 04/05/2014 0849   CALCIUM 9.2 03/10/2022 1141   CALCIUM 8.7 04/05/2014 0849   PROT 6.5 03/10/2022 1141   PROT 7.2 10/11/2018 1115   PROT 6.7 04/05/2014 0849   ALBUMIN 4.3 03/10/2022 1141   ALBUMIN 4.7 10/11/2018 1115   ALBUMIN 3.8 04/05/2014 0849   AST 20 03/10/2022 1141   AST 26 04/05/2014 0849   ALT 28 03/10/2022 1141   ALT 30 04/05/2014 0849   ALKPHOS 56 03/10/2022 1141   ALKPHOS 75 04/05/2014 0849   BILITOT 0.9 03/10/2022 1141   BILITOT 0.71 04/05/2014 0849   GFRNONAA >60 03/10/2022 1141   GFRAA  >60 06/14/2020 0830    No results found for: SPEP, UPEP  Lab Results  Component Value Date   WBC 5.8 03/10/2022   NEUTROABS 2.1 03/10/2022   HGB 14.1 03/10/2022   HCT 41.1 03/10/2022   MCV 106.2 (H) 03/10/2022   PLT 171 03/10/2022      Chemistry      Component Value Date/Time   NA 138 03/10/2022 1141   NA 139 10/11/2018 1115   NA 139 04/05/2014 0849   K 4.4 03/10/2022 1141   K 4.4 04/05/2014 0849   CL 103 03/10/2022 1141   CO2 31 03/10/2022 1141   CO2 23 04/05/2014 0849   BUN 23 03/10/2022 1141   BUN 23 10/11/2018 1115   BUN 16.8 04/05/2014 0849   CREATININE 0.78 03/10/2022 1141  CREATININE 0.91 07/29/2017 1004   CREATININE 0.9 04/05/2014 0849   GLU 10 04/10/2014 0000      Component Value Date/Time   CALCIUM 9.2 03/10/2022 1141   CALCIUM 8.7 04/05/2014 0849   ALKPHOS 56 03/10/2022 1141   ALKPHOS 75 04/05/2014 0849   AST 20 03/10/2022 1141   AST 26 04/05/2014 0849   ALT 28 03/10/2022 1141   ALT 30 04/05/2014 0849   BILITOT 0.9 03/10/2022 1141   BILITOT 0.71 04/05/2014 0849

## 2022-03-10 NOTE — Assessment & Plan Note (Signed)
Myeloma panel from today is pending His recent myeloma panel showed excellent response to treatment but with fluctuation of M protein and light chain His treatment has been changed to every 3 weeks and the dose of dexamethasone has been reduced He will continue acyclovir for antimicrobial prophylaxis He will continue calcium with vitamin D and Zometa every 6 months due to history of bone pain We will continue myeloma panel monitoring on the monthly basis

## 2022-03-10 NOTE — Patient Instructions (Signed)
[facility] ONCOLOGY  Discharge Instructions: Thank you for choosing [facility] to provide your oncology and hematology care.   If you have a lab appointment with the [facility], please go directly to the [facility] and check in at the registration area.   Wear comfortable clothing and clothing appropriate for easy access to any Portacath or PICC line.   We strive to give you quality time with your provider. You may need to reschedule your appointment if you arrive late (15 or more minutes).  Arriving late affects you and other patients whose appointments are after yours.  Also, if you miss three or more appointments without notifying the office, you may be dismissed from the clinic at the provider's discretion.      For prescription refill requests, have your pharmacy contact our office and allow 72 hours for refills to be completed.    Today you received the following chemotherapy and/or immunotherapy agents: Velcade and Darzalex Faspro      To help prevent nausea and vomiting after your treatment, we encourage you to take your nausea medication as directed.  BELOW ARE SYMPTOMS THAT SHOULD BE REPORTED IMMEDIATELY: *FEVER GREATER THAN 100.4 F (38 C) OR HIGHER *CHILLS OR SWEATING *NAUSEA AND VOMITING THAT IS NOT CONTROLLED WITH YOUR NAUSEA MEDICATION *UNUSUAL SHORTNESS OF BREATH *UNUSUAL BRUISING OR BLEEDING *URINARY PROBLEMS (pain or burning when urinating, or frequent urination) *BOWEL PROBLEMS (unusual diarrhea, constipation, pain near the anus) TENDERNESS IN MOUTH AND THROAT WITH OR WITHOUT PRESENCE OF ULCERS (sore throat, sores in mouth, or a toothache) UNUSUAL RASH, SWELLING OR PAIN  UNUSUAL VAGINAL DISCHARGE OR ITCHING   Items with * indicate a potential emergency and should be followed up as soon as possible or go to the Emergency Department if any problems should occur.  Please show the CHEMOTHERAPY ALERT CARD or IMMUNOTHERAPY ALERT  CARD at check-in to the Emergency Department and triage nurse.  Should you have questions after your visit or need to cancel or reschedule your appointment, please contact [facility]  Dept: [phone]  and follow the prompts.  Office hours are 8:00 a.m. to 4:30 p.m. Monday - Friday. Please note that voicemails left after 4:00 p.m. may not be returned until the following business day.  We are closed weekends and major holidays. You have access to a nurse at all times for urgent questions. Please call the main number to the clinic Dept: [phone] and follow the prompts.   For any non-urgent questions, you may also contact your provider using MyChart. We now offer e-Visits for anyone 65 and older to request care online for non-urgent symptoms. For details visit [email or web address].   Also download the MyChart app! Go to the app store, search "MyChart", open the app, select [facility], and log in with your MyChart username and password.  Due to Covid, a mask is required upon entering the hospital/clinic. If you do not have a mask, one will be given to you upon arrival. For doctor visits, patients may have 1 support person aged 49 or older with them. For treatment visits, patients cannot have anyone with them due to current Covid guidelines and our immunocompromised population.

## 2022-03-11 LAB — KAPPA/LAMBDA LIGHT CHAINS
Kappa free light chain: 12.7 mg/L (ref 3.3–19.4)
Kappa, lambda light chain ratio: 1.65 (ref 0.26–1.65)
Lambda free light chains: 7.7 mg/L (ref 5.7–26.3)

## 2022-03-16 ENCOUNTER — Telehealth: Payer: Self-pay

## 2022-03-16 LAB — MULTIPLE MYELOMA PANEL, SERUM
Albumin SerPl Elph-Mcnc: 3.6 g/dL (ref 2.9–4.4)
Albumin/Glob SerPl: 1.6 (ref 0.7–1.7)
Alpha 1: 0.2 g/dL (ref 0.0–0.4)
Alpha2 Glob SerPl Elph-Mcnc: 0.7 g/dL (ref 0.4–1.0)
B-Globulin SerPl Elph-Mcnc: 0.8 g/dL (ref 0.7–1.3)
Gamma Glob SerPl Elph-Mcnc: 0.5 g/dL (ref 0.4–1.8)
Globulin, Total: 2.3 g/dL (ref 2.2–3.9)
IgA: 19 mg/dL — ABNORMAL LOW (ref 61–437)
IgG (Immunoglobin G), Serum: 583 mg/dL — ABNORMAL LOW (ref 603–1613)
IgM (Immunoglobulin M), Srm: 12 mg/dL — ABNORMAL LOW (ref 20–172)
M Protein SerPl Elph-Mcnc: 0.3 g/dL — ABNORMAL HIGH
Total Protein ELP: 5.9 g/dL — ABNORMAL LOW (ref 6.0–8.5)

## 2022-03-16 NOTE — Telephone Encounter (Signed)
-----   Message from Heath Lark, MD sent at 03/16/2022  3:07 PM EDT ----- Tell Jesse Mcclure that his light chains are normal even though M protein is still abnormal No change in chemo plan

## 2022-03-16 NOTE — Telephone Encounter (Signed)
Called and given below message. He verbalized understanding. 

## 2022-03-17 ENCOUNTER — Other Ambulatory Visit: Payer: Self-pay | Admitting: Hematology and Oncology

## 2022-03-19 ENCOUNTER — Ambulatory Visit (INDEPENDENT_AMBULATORY_CARE_PROVIDER_SITE_OTHER): Payer: Medicare Other | Admitting: Medical

## 2022-03-19 VITALS — BP 130/80 | HR 100 | Temp 99.0°F | Resp 16 | Wt 229.6 lb

## 2022-03-19 DIAGNOSIS — C9000 Multiple myeloma not having achieved remission: Secondary | ICD-10-CM

## 2022-03-19 DIAGNOSIS — D701 Agranulocytosis secondary to cancer chemotherapy: Secondary | ICD-10-CM

## 2022-03-19 DIAGNOSIS — J988 Other specified respiratory disorders: Secondary | ICD-10-CM

## 2022-03-19 DIAGNOSIS — T451X5A Adverse effect of antineoplastic and immunosuppressive drugs, initial encounter: Secondary | ICD-10-CM | POA: Diagnosis not present

## 2022-03-19 DIAGNOSIS — R051 Acute cough: Secondary | ICD-10-CM

## 2022-03-19 MED ORDER — AZITHROMYCIN 250 MG PO TABS: ORAL_TABLET | ORAL | 0 refills | Status: DC

## 2022-03-19 MED ORDER — MUCINEX DM 30-600 MG PO TB12: 1.0000 | ORAL_TABLET | Freq: Two times a day (BID) | ORAL | 0 refills | Status: DC

## 2022-03-19 NOTE — Progress Notes (Signed)
Subjective:  Jesse Mcclure. is a 71 y.o. adult who presents for Chief Complaint  Patient presents with   Nasal Congestion    Chest congestion, deep cough. Since last week, no fever, some SOB, taking otc medication to help,      Here for about a week history of cough, chest congestion, some shortness of breath, not feeling well.  Did a home covid test today and it was negative.   No fever, no body aches or chills.  No NVD.  Has felt a little wheezy.    Has some head pressure.  Has sore throat.  No ear pain.  Using Coricidin OTC, but makes him feel worse.   Tried tylenol and oxycodone with helped the headache.   Last illness - can't remember?  Is a cancer patient, sees Dr. Alvy Bimler for multiple myeloma.   No smoker, no hx/o asthma.  No other aggravating or relieving factors.    No other c/o.  Past Medical History:  Diagnosis Date   BPH (benign prostatic hypertrophy)    Degenerative disc disease    Diverticulosis    Hemochromatosis    HTN (hypertension)    Hyperlipidemia    Multiple myeloma (HCC)    Polio    Status post Nissen fundoplication (without gastrostomy tube) procedure    for hiatal hernia.   Current Outpatient Medications on File Prior to Visit  Medication Sig Dispense Refill   acetaminophen (TYLENOL) 650 MG CR tablet Take 650 mg by mouth every 8 (eight) hours as needed for pain.     atorvastatin (LIPITOR) 20 MG tablet Take 1 tablet (20 mg total) by mouth daily. 90 tablet 4   calcium carbonate (TUMS - DOSED IN MG ELEMENTAL CALCIUM) 500 MG chewable tablet Chew 1-2 tablets by mouth daily as needed for heartburn.     Cholecalciferol (VITAMIN D) 50 MCG (2000 UT) tablet Take 2,000 Units by mouth daily.     folic acid (FOLVITE) 967 MCG tablet Take 400 mcg by mouth daily.     losartan-hydrochlorothiazide (HYZAAR) 100-12.5 MG tablet TAKE 1 TABLET BY MOUTH DAILY 90 tablet 0   Multiple Vitamin (MULTI-VITAMIN) tablet Take 1 tablet by mouth daily.     ondansetron (ZOFRAN) 8 MG tablet  Take 1 tablet (8 mg total) by mouth 2 (two) times daily as needed (Nausea or vomiting). 30 tablet 1   oxyCODONE (ROXICODONE) 5 MG immediate release tablet Take 1 tablet (5 mg total) by mouth every 4 (four) hours as needed for severe pain. 60 tablet 0   Polyethyl Glycol-Propyl Glycol (SYSTANE OP) Place 1 drop into both eyes daily as needed (dry eyes).     prochlorperazine (COMPAZINE) 10 MG tablet Take 1 tablet (10 mg total) by mouth every 6 (six) hours as needed (Nausea or vomiting). 30 tablet 1   rivaroxaban (XARELTO) 20 MG TABS tablet Take 1 tablet (20 mg total) by mouth daily with supper. 90 tablet 1   vitamin B-12 (CYANOCOBALAMIN) 1000 MCG tablet Take 1,000 mcg by mouth daily.     No current facility-administered medications on file prior to visit.    The following portions of the patient's history were reviewed and updated as appropriate: allergies, current medications, past family history, past medical history, past social history, past surgical history and problem list.  ROS Otherwise as in subjective above  Objective: BP 130/80   Pulse 100   Temp 99 F (37.2 C)   Resp 16   Wt 229 lb 9.6 oz (104.1 kg)  SpO2 96%   BMI 33.91 kg/m   General appearance: alert, no distress, well developed, well nourished, coughing quite a bit HEENT: normocephalic, sclerae anicteric, conjunctiva pink and moist, TMs flat, nares patent, no discharge or erythema, pharynx normal Oral cavity: MMM, no lesions Neck: supple, no lymphadenopathy, no thyromegaly, no masses, no JVD Heart: RRR, normal S1, S2, no murmurs Lungs:  no wheezes, +rhonchi, or rales, somewhat decreased breath sounds Pulses: 2+ radial pulses, 2+ pedal pulses, normal cap refill Ext: no edema   Assessment: Encounter Diagnoses  Name Primary?   Acute cough Yes   Respiratory tract infection    Multiple myeloma without remission (HCC)    Leukopenia due to antineoplastic chemotherapy ([place])      Plan: We discussed symptoms and  concerns.  Currently vital signs are stable.  Begin medication as below, rest, hydrate well, stop the Coricidin and change to the Mucinex DM.  He can separate out oxycodone by at least 3 to 4 hours and Mucinex DM to get another cough suppressant benefit from the oxycodone.  He has oxycodone already  Discussed that if much worse over the next few days such as fever of 101, worsening symptoms, unable to keep anything down, more dehydrated or weak or lightheaded then go to the emergency department or call right away  Ledon was seen today for nasal congestion.  Diagnoses and all orders for this visit:  Acute cough  Respiratory tract infection  Multiple myeloma without remission ([facility])  Leukopenia due to antineoplastic chemotherapy ([facility])  Other orders -     azithromycin (ZITHROMAX) 250 MG tablet; 2 tablets day 1, then 1 tablet days 2-4 -     Dextromethorphan-guaiFENesin (MUCINEX DM) 30-600 MG TB12; Take 1 tablet by mouth in the morning and at bedtime.    Follow up: prn

## 2022-03-23 ENCOUNTER — Other Ambulatory Visit: Payer: Self-pay | Admitting: Hematology and Oncology

## 2022-03-23 ENCOUNTER — Telehealth: Payer: Self-pay

## 2022-03-23 DIAGNOSIS — G8929 Other chronic pain: Secondary | ICD-10-CM

## 2022-03-23 MED ORDER — OXYCODONE HCL 5 MG PO TABS: 5.0000 mg | ORAL_TABLET | Freq: Four times a day (QID) | ORAL | 0 refills | Status: DC | PRN

## 2022-03-23 NOTE — Telephone Encounter (Signed)
Called and given below message. He verbalized understanding and will stop taking the Oxycodone for cough.

## 2022-03-23 NOTE — Telephone Encounter (Signed)
I do not recommend him to take oxycodone for cough He will get dependent on it He can get oTC cough syrup I will refill

## 2022-03-23 NOTE — Telephone Encounter (Signed)
He called and left a message asking for Oxycodone refill.  He saw Dr. Glade Lloyd 6/15 for upper respiratory infection and started antibiotic. He has been taking the Oxycodone to help with cough.

## 2022-03-31 ENCOUNTER — Inpatient Hospital Stay: Payer: Medicare Other

## 2022-03-31 ENCOUNTER — Inpatient Hospital Stay (HOSPITAL_BASED_OUTPATIENT_CLINIC_OR_DEPARTMENT_OTHER): Payer: Medicare Other | Admitting: Hematology and Oncology

## 2022-03-31 ENCOUNTER — Other Ambulatory Visit: Payer: Self-pay

## 2022-03-31 ENCOUNTER — Encounter: Payer: Self-pay | Admitting: Hematology and Oncology

## 2022-03-31 VITALS — Temp 98.1°F

## 2022-03-31 DIAGNOSIS — E669 Obesity, unspecified: Secondary | ICD-10-CM

## 2022-03-31 DIAGNOSIS — C9000 Multiple myeloma not having achieved remission: Secondary | ICD-10-CM

## 2022-03-31 DIAGNOSIS — Z5112 Encounter for antineoplastic immunotherapy: Secondary | ICD-10-CM | POA: Diagnosis not present

## 2022-03-31 DIAGNOSIS — G8929 Other chronic pain: Secondary | ICD-10-CM | POA: Diagnosis not present

## 2022-03-31 DIAGNOSIS — Z923 Personal history of irradiation: Secondary | ICD-10-CM | POA: Diagnosis not present

## 2022-03-31 DIAGNOSIS — M7918 Myalgia, other site: Secondary | ICD-10-CM

## 2022-03-31 DIAGNOSIS — M549 Dorsalgia, unspecified: Secondary | ICD-10-CM | POA: Diagnosis not present

## 2022-03-31 LAB — CMP (CANCER CENTER ONLY)
ALT: 24 U/L (ref 0–44)
AST: 19 U/L (ref 15–41)
Albumin: 4.2 g/dL (ref 3.5–5.0)
Alkaline Phosphatase: 56 U/L (ref 38–126)
Anion gap: 5 (ref 5–15)
BUN: 20 mg/dL (ref 8–23)
CO2: 29 mmol/L (ref 22–32)
Calcium: 9.3 mg/dL (ref 8.9–10.3)
Chloride: 103 mmol/L (ref 98–111)
Creatinine: 0.76 mg/dL (ref 0.61–1.24)
GFR, Estimated: >60 mL/min (ref >60)
Glucose, Bld: 97 mg/dL (ref 70–99)
Potassium: 4.1 mmol/L (ref 3.5–5.1)
Sodium: 137 mmol/L (ref 135–145)
Total Bilirubin: 0.8 mg/dL (ref 0.3–1.2)
Total Protein: 6.4 g/dL — ABNORMAL LOW (ref 6.5–8.1)

## 2022-03-31 LAB — CBC WITH DIFFERENTIAL (CANCER CENTER ONLY)
Abs Immature Granulocytes: 0.01 10*3/uL (ref 0.00–0.07)
Basophils Absolute: 0 10*3/uL (ref 0.0–0.1)
Basophils Relative: 1 %
Eosinophils Absolute: 0.1 10*3/uL (ref 0.0–0.5)
Eosinophils Relative: 1 %
HCT: 42.2 % (ref 39.0–52.0)
Hemoglobin: 14.9 g/dL (ref 13.0–17.0)
Immature Granulocytes: 0 %
Lymphocytes Relative: 42 %
Lymphs Abs: 2.4 10*3/uL (ref 0.7–4.0)
MCH: 36.6 pg — ABNORMAL HIGH (ref 26.0–34.0)
MCHC: 35.3 g/dL (ref 30.0–36.0)
MCV: 103.7 fL — ABNORMAL HIGH (ref 80.0–100.0)
Monocytes Absolute: 0.9 10*3/uL (ref 0.1–1.0)
Monocytes Relative: 16 %
Neutro Abs: 2.3 10*3/uL (ref 1.7–7.7)
Neutrophils Relative %: 40 %
Platelet Count: 169 10*3/uL (ref 150–400)
RBC: 4.07 MIL/uL — ABNORMAL LOW (ref 4.22–5.81)
RDW: 12.3 % (ref 11.5–15.5)
WBC Count: 5.7 10*3/uL (ref 4.0–10.5)
nRBC: 0 % (ref 0.0–0.2)

## 2022-03-31 MED ORDER — DEXAMETHASONE 4 MG PO TABS
4.0000 mg | ORAL_TABLET | Freq: Once | ORAL | Status: AC
  Administered 2022-03-31: 4 mg via ORAL
  Filled 2022-03-31: qty 1

## 2022-03-31 MED ORDER — DIPHENHYDRAMINE HCL 25 MG PO CAPS
25.0000 mg | ORAL_CAPSULE | Freq: Once | ORAL | Status: AC
  Administered 2022-03-31: 25 mg via ORAL
  Filled 2022-03-31: qty 1

## 2022-03-31 MED ORDER — DARATUMUMAB-HYALURONIDASE-FIHJ 1800-30000 MG-UT/15ML ~~LOC~~ SOLN
1800.0000 mg | Freq: Once | SUBCUTANEOUS | Status: AC
  Administered 2022-03-31: 1800 mg via SUBCUTANEOUS
  Filled 2022-03-31: qty 15

## 2022-03-31 MED ORDER — BORTEZOMIB CHEMO SQ INJECTION 3.5 MG (2.5MG/ML)
1.0400 mg/m2 | Freq: Once | INTRAMUSCULAR | Status: AC
  Administered 2022-03-31: 2.25 mg via SUBCUTANEOUS
  Filled 2022-03-31: qty 0.9

## 2022-03-31 MED ORDER — ACETAMINOPHEN 325 MG PO TABS
650.0000 mg | ORAL_TABLET | Freq: Once | ORAL | Status: AC
  Administered 2022-03-31: 650 mg via ORAL
  Filled 2022-03-31: qty 2

## 2022-04-01 LAB — KAPPA/LAMBDA LIGHT CHAINS
Kappa free light chain: 10.1 mg/L (ref 3.3–19.4)
Kappa, lambda light chain ratio: 1.74 — ABNORMAL HIGH (ref 0.26–1.65)
Lambda free light chains: 5.8 mg/L (ref 5.7–26.3)

## 2022-04-02 ENCOUNTER — Other Ambulatory Visit: Payer: Self-pay

## 2022-04-02 ENCOUNTER — Other Ambulatory Visit: Payer: Self-pay | Admitting: Family Medicine

## 2022-04-02 MED ORDER — LOSARTAN POTASSIUM-HCTZ 100-12.5 MG PO TABS: 1.0000 | ORAL_TABLET | Freq: Every day | ORAL | 0 refills | Status: DC

## 2022-04-03 ENCOUNTER — Other Ambulatory Visit: Payer: Self-pay | Admitting: Family Medicine

## 2022-04-03 DIAGNOSIS — I2699 Other pulmonary embolism without acute cor pulmonale: Secondary | ICD-10-CM

## 2022-04-07 LAB — MULTIPLE MYELOMA PANEL, SERUM
Albumin SerPl Elph-Mcnc: 3.6 g/dL (ref 2.9–4.4)
Albumin/Glob SerPl: 1.7 (ref 0.7–1.7)
Alpha 1: 0.2 g/dL (ref 0.0–0.4)
Alpha2 Glob SerPl Elph-Mcnc: 0.8 g/dL (ref 0.4–1.0)
B-Globulin SerPl Elph-Mcnc: 0.8 g/dL (ref 0.7–1.3)
Gamma Glob SerPl Elph-Mcnc: 0.4 g/dL (ref 0.4–1.8)
Globulin, Total: 2.2 g/dL (ref 2.2–3.9)
IgA: 15 mg/dL — ABNORMAL LOW (ref 61–437)
IgG (Immunoglobin G), Serum: 526 mg/dL — ABNORMAL LOW (ref 603–1613)
IgM (Immunoglobulin M), Srm: 11 mg/dL — ABNORMAL LOW (ref 20–172)
M Protein SerPl Elph-Mcnc: 0.2 g/dL — ABNORMAL HIGH
Total Protein ELP: 5.8 g/dL — ABNORMAL LOW (ref 6.0–8.5)

## 2022-04-13 DIAGNOSIS — L821 Other seborrheic keratosis: Secondary | ICD-10-CM | POA: Diagnosis not present

## 2022-04-13 DIAGNOSIS — D485 Neoplasm of uncertain behavior of skin: Secondary | ICD-10-CM | POA: Diagnosis not present

## 2022-04-13 DIAGNOSIS — D0439 Carcinoma in situ of skin of other parts of face: Secondary | ICD-10-CM | POA: Diagnosis not present

## 2022-04-13 DIAGNOSIS — D225 Melanocytic nevi of trunk: Secondary | ICD-10-CM | POA: Diagnosis not present

## 2022-04-13 DIAGNOSIS — L570 Actinic keratosis: Secondary | ICD-10-CM | POA: Diagnosis not present

## 2022-04-13 HISTORY — PX: SKIN BIOPSY: SHX1

## 2022-04-15 ENCOUNTER — Other Ambulatory Visit: Payer: Self-pay | Admitting: Hematology and Oncology

## 2022-04-15 DIAGNOSIS — M7918 Myalgia, other site: Secondary | ICD-10-CM

## 2022-04-15 MED ORDER — OXYCODONE HCL 5 MG PO TABS: 5.0000 mg | ORAL_TABLET | Freq: Four times a day (QID) | ORAL | 0 refills | Status: DC | PRN

## 2022-04-16 ENCOUNTER — Other Ambulatory Visit: Payer: Self-pay | Admitting: Hematology and Oncology

## 2022-04-16 ENCOUNTER — Telehealth: Payer: Self-pay | Admitting: *Deleted

## 2022-04-16 NOTE — Telephone Encounter (Signed)
Per Dr.Gorsuch, advised pt of message below. Pt verbalized understanding.

## 2022-04-16 NOTE — Telephone Encounter (Signed)
-----   Message from Heath Lark, MD sent at 04/16/2022  9:17 AM EDT ----- He needs to stay on it for 1 year until Dec ----- Message ----- From: Merril Abbe, LPN Sent: 0/80/2233   9:12 AM EDT To: Heath Lark, MD  Pt wants to know if he should continue xarelto. Please advise

## 2022-04-21 ENCOUNTER — Encounter: Payer: Self-pay | Admitting: Hematology and Oncology

## 2022-04-21 ENCOUNTER — Inpatient Hospital Stay: Payer: Medicare Other

## 2022-04-21 ENCOUNTER — Inpatient Hospital Stay: Payer: Medicare Other | Attending: Hematology and Oncology

## 2022-04-21 ENCOUNTER — Inpatient Hospital Stay (HOSPITAL_BASED_OUTPATIENT_CLINIC_OR_DEPARTMENT_OTHER): Payer: Medicare Other | Admitting: Hematology and Oncology

## 2022-04-21 ENCOUNTER — Other Ambulatory Visit: Payer: Self-pay

## 2022-04-21 DIAGNOSIS — Z85828 Personal history of other malignant neoplasm of skin: Secondary | ICD-10-CM | POA: Insufficient documentation

## 2022-04-21 DIAGNOSIS — G8929 Other chronic pain: Secondary | ICD-10-CM | POA: Diagnosis not present

## 2022-04-21 DIAGNOSIS — M7918 Myalgia, other site: Secondary | ICD-10-CM | POA: Insufficient documentation

## 2022-04-21 DIAGNOSIS — C4492 Squamous cell carcinoma of skin, unspecified: Secondary | ICD-10-CM | POA: Insufficient documentation

## 2022-04-21 DIAGNOSIS — Z9484 Stem cells transplant status: Secondary | ICD-10-CM | POA: Diagnosis not present

## 2022-04-21 DIAGNOSIS — C9000 Multiple myeloma not having achieved remission: Secondary | ICD-10-CM | POA: Insufficient documentation

## 2022-04-21 DIAGNOSIS — Z5112 Encounter for antineoplastic immunotherapy: Secondary | ICD-10-CM | POA: Insufficient documentation

## 2022-04-21 DIAGNOSIS — Z86711 Personal history of pulmonary embolism: Secondary | ICD-10-CM | POA: Diagnosis not present

## 2022-04-21 DIAGNOSIS — Z7952 Long term (current) use of systemic steroids: Secondary | ICD-10-CM | POA: Diagnosis not present

## 2022-04-21 DIAGNOSIS — Z9481 Bone marrow transplant status: Secondary | ICD-10-CM | POA: Insufficient documentation

## 2022-04-21 DIAGNOSIS — Z79899 Other long term (current) drug therapy: Secondary | ICD-10-CM | POA: Diagnosis not present

## 2022-04-21 DIAGNOSIS — I2699 Other pulmonary embolism without acute cor pulmonale: Secondary | ICD-10-CM | POA: Diagnosis not present

## 2022-04-21 DIAGNOSIS — Z7901 Long term (current) use of anticoagulants: Secondary | ICD-10-CM | POA: Diagnosis not present

## 2022-04-21 LAB — CBC WITH DIFFERENTIAL (CANCER CENTER ONLY)
Abs Immature Granulocytes: 0 10*3/uL (ref 0.00–0.07)
Basophils Absolute: 0 10*3/uL (ref 0.0–0.1)
Basophils Relative: 0 %
Eosinophils Absolute: 0.1 10*3/uL (ref 0.0–0.5)
Eosinophils Relative: 2 %
HCT: 44.5 % (ref 39.0–52.0)
Hemoglobin: 15.4 g/dL (ref 13.0–17.0)
Immature Granulocytes: 0 %
Lymphocytes Relative: 41 %
Lymphs Abs: 2.2 10*3/uL (ref 0.7–4.0)
MCH: 36 pg — ABNORMAL HIGH (ref 26.0–34.0)
MCHC: 34.6 g/dL (ref 30.0–36.0)
MCV: 104 fL — ABNORMAL HIGH (ref 80.0–100.0)
Monocytes Absolute: 0.8 10*3/uL (ref 0.1–1.0)
Monocytes Relative: 15 %
Neutro Abs: 2.3 10*3/uL (ref 1.7–7.7)
Neutrophils Relative %: 42 %
Platelet Count: 153 10*3/uL (ref 150–400)
RBC: 4.28 MIL/uL (ref 4.22–5.81)
RDW: 12.2 % (ref 11.5–15.5)
WBC Count: 5.3 10*3/uL (ref 4.0–10.5)
nRBC: 0 % (ref 0.0–0.2)

## 2022-04-21 LAB — CMP (CANCER CENTER ONLY)
ALT: 32 U/L (ref 0–44)
AST: 25 U/L (ref 15–41)
Albumin: 4.3 g/dL (ref 3.5–5.0)
Alkaline Phosphatase: 56 U/L (ref 38–126)
Anion gap: 4 — ABNORMAL LOW (ref 5–15)
BUN: 21 mg/dL (ref 8–23)
CO2: 30 mmol/L (ref 22–32)
Calcium: 9.4 mg/dL (ref 8.9–10.3)
Chloride: 104 mmol/L (ref 98–111)
Creatinine: 0.83 mg/dL (ref 0.61–1.24)
GFR, Estimated: >60 mL/min (ref >60)
Glucose, Bld: 96 mg/dL (ref 70–99)
Potassium: 4.2 mmol/L (ref 3.5–5.1)
Sodium: 138 mmol/L (ref 135–145)
Total Bilirubin: 0.9 mg/dL (ref 0.3–1.2)
Total Protein: 6.6 g/dL (ref 6.5–8.1)

## 2022-04-21 MED ORDER — DIPHENHYDRAMINE HCL 25 MG PO CAPS
25.0000 mg | ORAL_CAPSULE | Freq: Once | ORAL | Status: AC
  Administered 2022-04-21: 25 mg via ORAL
  Filled 2022-04-21: qty 1

## 2022-04-21 MED ORDER — DEXAMETHASONE 4 MG PO TABS
4.0000 mg | ORAL_TABLET | Freq: Once | ORAL | Status: AC
  Administered 2022-04-21: 4 mg via ORAL
  Filled 2022-04-21: qty 1

## 2022-04-21 MED ORDER — BORTEZOMIB CHEMO SQ INJECTION 3.5 MG (2.5MG/ML)
1.0400 mg/m2 | Freq: Once | INTRAMUSCULAR | Status: AC
  Administered 2022-04-21: 2.25 mg via SUBCUTANEOUS
  Filled 2022-04-21: qty 0.9

## 2022-04-21 MED ORDER — DARATUMUMAB-HYALURONIDASE-FIHJ 1800-30000 MG-UT/15ML ~~LOC~~ SOLN
1800.0000 mg | Freq: Once | SUBCUTANEOUS | Status: AC
  Administered 2022-04-21: 1800 mg via SUBCUTANEOUS
  Filled 2022-04-21: qty 15

## 2022-04-21 MED ORDER — ACETAMINOPHEN 325 MG PO TABS
650.0000 mg | ORAL_TABLET | Freq: Once | ORAL | Status: AC
  Administered 2022-04-21: 650 mg via ORAL
  Filled 2022-04-21: qty 2

## 2022-04-21 NOTE — Assessment & Plan Note (Signed)
I would defer to his dermatologist for treatment We discussed importance of sunscreen

## 2022-04-21 NOTE — Assessment & Plan Note (Signed)
His recent myeloma panel show stability We will proceed with treatment as scheduled Next month, he will be switched to maintenance daratumumab only Zometa will be every 6 months, due in September

## 2022-04-21 NOTE — Assessment & Plan Note (Signed)
His chronic pain is stable He will continue oxycodone as needed

## 2022-04-21 NOTE — Progress Notes (Signed)
[facility] OFFICE PROGRESS NOTE  Patient Care Team: Denita Lung, MD as PCP - General (Family Medicine) Heath Lark, MD as Consulting Physician (Hematology and Oncology)  ASSESSMENT & PLAN:  Multiple myeloma without remission [facility]) His recent myeloma panel show stability We will proceed with treatment as scheduled Next month, he will be switched to maintenance daratumumab only Zometa will be every 6 months, due in September  Hemochromatosis, hereditary We discussed the timing of phlebotomy We will arrange for phlebotomy next month  Chronic musculoskeletal pain His chronic pain is stable He will continue oxycodone as needed  Squamous cell skin cancer I would defer to his dermatologist for treatment We discussed importance of sunscreen  Acute pulmonary embolism without acute cor pulmonale ([place]) He needs to remain on anticoagulation therapy until end of the year  No orders of the defined types were placed in this encounter.   All questions were answered. The patient knows to call the clinic with any problems, questions or concerns. The total time spent in the appointment was 30 minutes encounter with patients including review of chart and various tests results, discussions about plan of care and coordination of care plan   Heath Lark, MD 04/21/2022 12:18 PM  INTERVAL HISTORY: Please see below for problem oriented charting. he returns for treatment follow-up on Velcade, dexamethasone and daratumumab for recurrent myeloma Recently, he had excision of skin lesions on his temple and was told it was squamous cell carcinoma He needs to return to see his dermatologist for further additional treatment He has no bleeding complications from anticoagulation therapy We discussed the timing of phlebotomy.  He has not had phlebotomy for a long time Denies significant neuropathy.  His chronic back pain is stable The patient is attempting to cut back on ginger ale and lose  some weight  REVIEW OF SYSTEMS:   Constitutional: Denies fevers, chills or abnormal weight loss Eyes: Denies blurriness of vision Ears, nose, mouth, throat, and face: Denies mucositis or sore throat Respiratory: Denies cough, dyspnea or wheezes Cardiovascular: Denies palpitation, chest discomfort or lower extremity swelling Gastrointestinal:  Denies nausea, heartburn or change in bowel habits Lymphatics: Denies new lymphadenopathy or easy bruising Neurological:Denies numbness, tingling or new weaknesses Behavioral/Psych: Mood is stable, no new changes  All other systems were reviewed with the patient and are negative.  I have reviewed the past medical history, past surgical history, social history and family history with the patient and they are unchanged from previous note.  ALLERGIES:  has No Known Allergies.  MEDICATIONS:  Current Outpatient Medications  Medication Sig Dispense Refill   acetaminophen (TYLENOL) 650 MG CR tablet Take 650 mg by mouth every 8 (eight) hours as needed for pain.     atorvastatin (LIPITOR) 20 MG tablet Take 1 tablet (20 mg total) by mouth daily. 90 tablet 4   calcium carbonate (TUMS - DOSED IN MG ELEMENTAL CALCIUM) 500 MG chewable tablet Chew 1-2 tablets by mouth daily as needed for heartburn.     Cholecalciferol (VITAMIN D) 50 MCG (2000 UT) tablet Take 2,000 Units by mouth daily.     Dextromethorphan-guaiFENesin (MUCINEX DM) 30-600 MG TB12 Take 1 tablet by mouth in the morning and at bedtime. 10 tablet 0   folic acid (FOLVITE) 270 MCG tablet Take 400 mcg by mouth daily.     losartan-hydrochlorothiazide (HYZAAR) 100-12.5 MG tablet Take 1 tablet by mouth daily. 90 tablet 0   Multiple Vitamin (MULTI-VITAMIN) tablet Take 1 tablet by mouth daily.  ondansetron (ZOFRAN) 8 MG tablet Take 1 tablet (8 mg total) by mouth 2 (two) times daily as needed (Nausea or vomiting). 30 tablet 1   oxyCODONE (ROXICODONE) 5 MG immediate release tablet Take 1 tablet (5 mg total)  by mouth every 6 (six) hours as needed for severe pain. 60 tablet 0   Polyethyl Glycol-Propyl Glycol (SYSTANE OP) Place 1 drop into both eyes daily as needed (dry eyes).     prochlorperazine (COMPAZINE) 10 MG tablet Take 1 tablet (10 mg total) by mouth every 6 (six) hours as needed (Nausea or vomiting). 30 tablet 1   vitamin B-12 (CYANOCOBALAMIN) 1000 MCG tablet Take 1,000 mcg by mouth daily.     XARELTO 20 MG TABS tablet TAKE 1 TABLET(20 MG) BY MOUTH DAILY WITH SUPPER 90 tablet 1   No current facility-administered medications for this visit.   Facility-Administered Medications Ordered in Other Visits  Medication Dose Route Frequency Provider Last Rate Last Admin   acetaminophen (TYLENOL) tablet 650 mg  650 mg Oral Once Alvy Bimler, Janyiah Silveri, MD       bortezomib SQ (VELCADE) chemo injection (2.55m/mL concentration) 2.25 mg  1.04 mg/m2 (Treatment Plan Recorded) Subcutaneous Once GAlvy Bimler Jady Braggs, MD       daratumumab-hyaluronidase-fihj (DARZALEX FASPRO) 1800-30000 MG-UT/15ML chemo SQ injection 1,800 mg  1,800 mg Subcutaneous Once Sherryll Skoczylas, MD       dexamethasone (DECADRON) tablet 4 mg  4 mg Oral Once GAlvy Bimler Temesgen Weightman, MD       diphenhydrAMINE (BENADRYL) capsule 25 mg  25 mg Oral Once GHeath Lark MD        SUMMARY OF ONCOLOGIC HISTORY: Oncology History  Multiple myeloma without remission ([facility]  03/20/2019 Imaging   1. Tumor involving the L5 and S1 and S2 segments of the spine as described with mass effects upon the left L4, L5, S1 and more distal left sacral nerves as described above. 2. Does the patient have a history of malignancy? 3. Multiple plasmacytomas and metastatic disease could give this appearance   03/27/2019 Pathology Results   Soft Tissue Needle Core Biopsy, sacral mass - PLASMABLASTIC NEOPLASM. - SEE COMMENT. Microscopic Comment The sections show needle core biopsy fragments of soft tissue densely infiltrated by a relatively monomorphic infiltrate of atypical plasmacytoid cells characterized  by vesicular or partially clumped chromatin and prominent nucleoli. This is associated with scattered mitosis. A battery of immunohistochemical stains was performed and show that the atypical plasmacytoid cells are positive for CD138, CD43 and cytoplasmic kappa. There is also weak positivity for CD56 and partial variable positivity for cyclin D1. The atypical plasmacytoid cells are negative for cytoplasmic lambda, CD10, PAX5, CD79a, CD20, CD3, CD5, CD34, EBV (ISH) and mostly negative for LCA. The overall morphologic and histologic features are most compatible with a plasmablastic neoplasm. The differential diagnosis includes plasmablastic lymphoma and plasmablastic plasmacytoma/myeloma. Based on the overall phenotypic features and the clinical setting, plasmacytoma/myeloma is favored. Clinical correlation and hematologic evaluation is recommended   03/27/2019 Procedure   Technically successful CT-guided biopsy of sacral soft tissue mass.   04/04/2019 Initial Diagnosis   Multiple myeloma without remission ([facility]   04/11/2019 PET scan   IMPRESSION: 1. Previously noted expansile lesions involving the L5 vertebra and sacrum are again noted and exhibit intense FDG uptake compatible with metabolically active disease. A third focus of increased uptake without corresponding CT abnormality is noted within the intertrochanteric portions of the proximal right femur. 2. Small lucent lesion within the T3 vertebra is noted without corresponding FDG uptake. 3. Asymmetric  left bladder wall thickening, etiology indeterminate. 4. Aortic Atherosclerosis (ICD10-I70.0). Lad coronary artery calcification.   04/12/2019 Cancer Staging   Staging form: Plasma Cell Myeloma and Plasma Cell Disorders, AJCC 8th Edition - Clinical stage from 04/12/2019: Beta-2-microglobulin (mg/L): 2, Albumin (g/dL): 3.6, ISS: Stage I, High-risk cytogenetics: Unknown, LDH: Unknown - Signed by Heath Lark, MD on 04/12/2019   04/14/2019 Bone Marrow Biopsy    Bone Marrow, Aspirate,Biopsy, and Clot, right iliac bone - PLASMA CELL MYELOMA.   04/17/2019 - 07/11/2019 Chemotherapy   The patient had bortezemib, revlimid and dexamethasone for chemotherapy treatment.     08/17/2019 Bone Marrow Transplant   He received high dose melphalan followed by autologous stem cell transplant at [facility]   11/22/2019 Bone Marrow Biopsy   BONE MARROW biopsy at [facility]:       Mild monoclonal plasmacytosis (approximately 2%), kappa light chain restricted.    11/22/2019 PET scan   PET CT at [facility] 1.  Similar size and appearance of mildly hypermetabolic lytic lesion in the L5 vertebral body and posterior elements.  2.  Otherwise, no substantial FDG uptake compared to background bone marrow in the additional osseous lucent lesions.    12/20/2019 - 05/09/2020 Radiation Therapy   Radiation Treatment Dates: 12/20/2019 through 01/08/2020 Site Technique Total Dose (Gy) Dose per Fx (Gy) Completed Fx Beam Energies  Lumbar Spine: Spine 3D 35/35 2.5 14/14 15X        02/29/2020 PET scan   1. Similar size and appearance of expansile lytic lesion in the L5 vertebral body and posterior elements with minimal FDG activity.  2. Otherwise, no additional osseous lucent lesions with FDG uptake above background bone marrow   05/31/2020 -  Chemotherapy   The patient had Revlimid for chemotherapy treatment.     11/25/2021 -  Chemotherapy   Patient is on Treatment Plan : MYELOMA RELAPSED / REFRACTORY Daratumumab SQ + Bortezomib + Dexamethasone (DaraVd) q21d / Daratumumab SQ q28d        PHYSICAL EXAMINATION: ECOG PERFORMANCE STATUS: 1 - Symptomatic but completely ambulatory  Vitals:   04/21/22 1145  BP: 126/82  Pulse: 84  Resp: 18  Temp: 99.1 F (37.3 C)  SpO2: 96%   Filed Weights   04/21/22 1145  Weight: 230 lb 6.4 oz (104.5 kg)    GENERAL:alert, no distress and comfortable SKIN: Noted some skin changes, nothing suspicious for skin cancer NEURO: alert &  oriented x 3 with fluent speech, no focal motor/sensory deficits  LABORATORY DATA:  I have reviewed the data as listed    Component Value Date/Time   NA 138 04/21/2022 1121   NA 139 10/11/2018 1115   NA 139 04/05/2014 0849   K 4.2 04/21/2022 1121   K 4.4 04/05/2014 0849   CL 104 04/21/2022 1121   CO2 30 04/21/2022 1121   CO2 23 04/05/2014 0849   GLUCOSE 96 04/21/2022 1121   GLUCOSE 109 04/05/2014 0849   BUN 21 04/21/2022 1121   BUN 23 10/11/2018 1115   BUN 16.8 04/05/2014 0849   CREATININE 0.83 04/21/2022 1121   CREATININE 0.91 07/29/2017 1004   CREATININE 0.9 04/05/2014 0849   CALCIUM 9.4 04/21/2022 1121   CALCIUM 8.7 04/05/2014 0849   PROT 6.6 04/21/2022 1121   PROT 7.2 10/11/2018 1115   PROT 6.7 04/05/2014 0849   ALBUMIN 4.3 04/21/2022 1121   ALBUMIN 4.7 10/11/2018 1115   ALBUMIN 3.8 04/05/2014 0849   AST 25 04/21/2022 1121   AST 26 04/05/2014 0849  ALT 32 04/21/2022 1121   ALT 30 04/05/2014 0849   ALKPHOS 56 04/21/2022 1121   ALKPHOS 75 04/05/2014 0849   BILITOT 0.9 04/21/2022 1121   BILITOT 0.71 04/05/2014 0849   GFRNONAA >60 04/21/2022 1121   GFRAA >60 06/14/2020 0830    No results found for: "SPEP", "UPEP"  Lab Results  Component Value Date   WBC 5.3 04/21/2022   NEUTROABS 2.3 04/21/2022   HGB 15.4 04/21/2022   HCT 44.5 04/21/2022   MCV 104.0 (H) 04/21/2022   PLT 153 04/21/2022      Chemistry      Component Value Date/Time   NA 138 04/21/2022 1121   NA 139 10/11/2018 1115   NA 139 04/05/2014 0849   K 4.2 04/21/2022 1121   K 4.4 04/05/2014 0849   CL 104 04/21/2022 1121   CO2 30 04/21/2022 1121   CO2 23 04/05/2014 0849   BUN 21 04/21/2022 1121   BUN 23 10/11/2018 1115   BUN 16.8 04/05/2014 0849   CREATININE 0.83 04/21/2022 1121   CREATININE 0.91 07/29/2017 1004   CREATININE 0.9 04/05/2014 0849   GLU 10 04/10/2014 0000      Component Value Date/Time   CALCIUM 9.4 04/21/2022 1121   CALCIUM 8.7 04/05/2014 0849   ALKPHOS 56 04/21/2022 1121    ALKPHOS 75 04/05/2014 0849   AST 25 04/21/2022 1121   AST 26 04/05/2014 0849   ALT 32 04/21/2022 1121   ALT 30 04/05/2014 0849   BILITOT 0.9 04/21/2022 1121   BILITOT 0.71 04/05/2014 0849

## 2022-04-21 NOTE — Assessment & Plan Note (Signed)
He needs to remain on anticoagulation therapy until end of the year

## 2022-04-21 NOTE — Assessment & Plan Note (Signed)
We discussed the timing of phlebotomy We will arrange for phlebotomy next month

## 2022-04-22 ENCOUNTER — Encounter: Payer: Self-pay | Admitting: Family Medicine

## 2022-04-22 ENCOUNTER — Ambulatory Visit (INDEPENDENT_AMBULATORY_CARE_PROVIDER_SITE_OTHER): Payer: Medicare Other | Admitting: Family Medicine

## 2022-04-22 VITALS — BP 102/60 | HR 108 | Temp 99.5°F | Ht 68.25 in | Wt 234.0 lb

## 2022-04-22 DIAGNOSIS — I440 Atrioventricular block, first degree: Secondary | ICD-10-CM

## 2022-04-22 DIAGNOSIS — Z86711 Personal history of pulmonary embolism: Secondary | ICD-10-CM

## 2022-04-22 DIAGNOSIS — E559 Vitamin D deficiency, unspecified: Secondary | ICD-10-CM

## 2022-04-22 DIAGNOSIS — E669 Obesity, unspecified: Secondary | ICD-10-CM

## 2022-04-22 DIAGNOSIS — C9000 Multiple myeloma not having achieved remission: Secondary | ICD-10-CM

## 2022-04-22 DIAGNOSIS — I10 Essential (primary) hypertension: Secondary | ICD-10-CM | POA: Diagnosis not present

## 2022-04-22 DIAGNOSIS — M1990 Unspecified osteoarthritis, unspecified site: Secondary | ICD-10-CM

## 2022-04-22 DIAGNOSIS — Z96652 Presence of left artificial knee joint: Secondary | ICD-10-CM

## 2022-04-22 DIAGNOSIS — R7302 Impaired glucose tolerance (oral): Secondary | ICD-10-CM | POA: Diagnosis not present

## 2022-04-22 DIAGNOSIS — E785 Hyperlipidemia, unspecified: Secondary | ICD-10-CM | POA: Diagnosis not present

## 2022-04-22 DIAGNOSIS — R972 Elevated prostate specific antigen [PSA]: Secondary | ICD-10-CM

## 2022-04-22 LAB — KAPPA/LAMBDA LIGHT CHAINS
Kappa free light chain: 13.1 mg/L (ref 3.3–19.4)
Kappa, lambda light chain ratio: 2.11 — ABNORMAL HIGH (ref 0.26–1.65)
Lambda free light chains: 6.2 mg/L (ref 5.7–26.3)

## 2022-04-22 MED ORDER — LOSARTAN POTASSIUM-HCTZ 100-12.5 MG PO TABS: 1.0000 | ORAL_TABLET | Freq: Every day | ORAL | 3 refills | Status: DC

## 2022-04-22 MED ORDER — ATORVASTATIN CALCIUM 20 MG PO TABS: 20.0000 mg | ORAL_TABLET | Freq: Every day | ORAL | 3 refills | Status: DC

## 2022-04-22 NOTE — Progress Notes (Signed)
Complete physical exam  Patient: Tristan Proto.   DOB: 09/22/1951   71 y.o. Adult  MRN: [id number]  Subjective:    Chief Complaint  Patient presents with   Annual Exam    Labs done     Quanta Roher. is a 72 y.o. adult who presents today for a complete physical exam. He reports consuming a general and low carb  diet. Home exercise routine includes walking . He generally feels well. He reports sleeping fairly well. He does not have additional problems to discuss today.  She was seen yesterday by Dr. Alvy Bimler for follow-up on his multiple myeloma.  He also has a history of pulmonary embolus probably secondary to his knee surgery however he discussed this with Dr. Alvy Bimler and will continue on Xarelto through December.  He also has a history of hemochromatosis and is also being followed by hematology for that.  He continues on Hyzaar and having no difficulty with that.  He does complain of occasional difficulty with arthritic pain but is not enough to be of any major significance.  He is also taking atorvastatin and having no aches or pains with that.  Review of the record indicates of vitamin D deficiency as well as elevated PSA and glucose intolerance.  He did have a left TKR in January and as mentioned before PE from that.   Most recent fall risk assessment:    12/26/2021    2:42 PM  Fall Risk   Falls in the past year? 0  Risk for fall due to : Medication side effect  Follow up Falls evaluation completed;Education provided;Falls prevention discussed     Most recent depression screenings:    12/26/2021    2:42 PM 03/17/2021   10:13 AM  PHQ 2/9 Scores  PHQ - 2 Score 0 0      Patient Active Problem List   Diagnosis Date Noted   Squamous cell skin cancer 04/21/2022   Thrombocytopenia ([place]) 12/23/2021   S/P TKR (total knee replacement), left 07/15/2021   Osteoarthritis of left knee 05/22/2021   Deficiency anemia 01/07/2021   ACE-inhibitor cough 12/16/2020   Leukopenia  due to antineoplastic chemotherapy ([place]) 07/22/2020   Tremor of both hands 10/11/2019   Peripheral neuropathy due to chemotherapy ([place]) 10/11/2019   Dry skin 10/11/2019   Insomnia disorder 09/06/2019   Loose stools 09/06/2019   S/P bone marrow transplant ([place]) 08/30/2019   Pancytopenia, acquired ([place]) 08/30/2019   Hypocalcemia 06/14/2019   Goals of care, counseling/discussion 04/12/2019   Multiple myeloma without remission ([place]) 04/04/2019   Other constipation 04/04/2019   Glucose intolerance (impaired glucose tolerance) 05/20/2016   Vitamin D deficiency 05/20/2016   Chronic musculoskeletal pain 01/09/2016   History of orchiectomy, unilateral 05/20/2015   Obesity (BMI 30-39.9) 05/20/2015   Elevated PSA 05/20/2015   Arthritis 05/20/2015   Essential hypertension 03/02/2012   Hyperlipidemia 03/02/2012   Hemochromatosis, hereditary ([place]) 11/28/2008   Past Medical History:  Diagnosis Date   BPH (benign prostatic hypertrophy)    Degenerative disc disease    Diverticulosis    Hemochromatosis    HTN (hypertension)    Hyperlipidemia    Multiple myeloma ([place])    Polio    Status post Nissen fundoplication (without gastrostomy tube) procedure    for hiatal hernia.   Past Surgical History:  Procedure Laterality Date   FOOT SURGERY     HIATAL HERNIA REPAIR     OTHER SURGICAL HISTORY     Cataract Surgery--Both  eyes   TOTAL KNEE ARTHROPLASTY Left 07/15/2021   Procedure: TOTAL KNEE ARTHROPLASTY;  Surgeon: Marchia Bond, MD;  Location: [facility];  Service: Orthopedics;  Laterality: Left;   Undescended testes Right 1968   Social History   Tobacco Use   Smoking status: Never   Smokeless tobacco: Never  Vaping Use   Vaping Use: Never used  Substance Use Topics   Alcohol use: Not Currently    Alcohol/week: 4.0 standard drinks of alcohol    Types: 4 drink(s) per week    Comment: occasionally   Drug use: No   Family History  Problem Relation Age of Onset   Breast cancer Mother     Breast cancer Sister    No Known Allergies    Patient Care Team: Denita Lung, MD as PCP - General (Family Medicine) Heath Lark, MD as Consulting Physician (Hematology and Oncology)   Outpatient Medications Prior to Visit  Medication Sig Note   acetaminophen (TYLENOL) 650 MG CR tablet Take 650 mg by mouth every 8 (eight) hours as needed for pain. 04/22/2022: Prn last dose this am   atorvastatin (LIPITOR) 20 MG tablet Take 1 tablet (20 mg total) by mouth daily.    Cholecalciferol (VITAMIN D) 50 MCG (2000 UT) tablet Take 2,000 Units by mouth daily.    folic acid (FOLVITE) 121 MCG tablet Take 400 mcg by mouth daily.    losartan-hydrochlorothiazide (HYZAAR) 100-12.5 MG tablet Take 1 tablet by mouth daily.    Multiple Vitamin (MULTI-VITAMIN) tablet Take 1 tablet by mouth daily.    oxyCODONE (ROXICODONE) 5 MG immediate release tablet Take 1 tablet (5 mg total) by mouth every 6 (six) hours as needed for severe pain. 04/22/2022: Prn last dose day before yesterday   Polyethyl Glycol-Propyl Glycol (SYSTANE OP) Place 1 drop into both eyes daily as needed (dry eyes).    prochlorperazine (COMPAZINE) 10 MG tablet Take 1 tablet (10 mg total) by mouth every 6 (six) hours as needed (Nausea or vomiting). 04/22/2022: Prn last dose last hs   vitamin B-12 (CYANOCOBALAMIN) 1000 MCG tablet Take 1,000 mcg by mouth daily.    XARELTO 20 MG TABS tablet TAKE 1 TABLET(20 MG) BY MOUTH DAILY WITH SUPPER    calcium carbonate (TUMS - DOSED IN MG ELEMENTAL CALCIUM) 500 MG chewable tablet Chew 1-2 tablets by mouth daily as needed for heartburn. (Patient not taking: Reported on 04/22/2022) 04/22/2022: Prn last dose over one month.   Dextromethorphan-guaiFENesin (MUCINEX DM) 30-600 MG TB12 Take 1 tablet by mouth in the morning and at bedtime. (Patient not taking: Reported on 04/22/2022)    ondansetron (ZOFRAN) 8 MG tablet Take 1 tablet (8 mg total) by mouth 2 (two) times daily as needed (Nausea or vomiting). (Patient not taking:  Reported on 04/22/2022) 04/22/2022: Prn last dose over month ago   No facility-administered medications prior to visit.    Review of Systems  All other systems reviewed and are negative.         Objective:     BP 102/60   Pulse (!) 108   Temp 99.5 F (37.5 C)   Ht 5' 8.25" (1.734 m)   Wt 234 lb (106.1 kg)   SpO2 96%   BMI 35.32 kg/m  BP Readings from Last 3 Encounters:  04/22/22 102/60  04/21/22 126/82  03/31/22 128/84   Wt Readings from Last 3 Encounters:  04/22/22 234 lb (106.1 kg)  04/21/22 230 lb 6.4 oz (104.5 kg)  03/31/22 231 lb (104.8 kg)  Physical Exam   Alert and in no distress. Tympanic membranes and canals are normal. Pharyngeal area is normal. Neck is supple without adenopathy or thyromegaly. Cardiac exam shows a regular sinus rhythm without murmurs or gallops. Lungs are clear to auscultation.  Surgical scar noted on the left knee anteriorly.  Last CBC Lab Results  Component Value Date   WBC 5.3 04/21/2022   HGB 15.4 04/21/2022   HCT 44.5 04/21/2022   MCV 104.0 (H) 04/21/2022   MCH 36.0 (H) 04/21/2022   RDW 12.2 04/21/2022   PLT 153 81/15/7262   Last metabolic panel Lab Results  Component Value Date   GLUCOSE 96 04/21/2022   NA 138 04/21/2022   K 4.2 04/21/2022   CL 104 04/21/2022   CO2 30 04/21/2022   BUN 21 04/21/2022   CREATININE 0.83 04/21/2022   GFRNONAA >60 04/21/2022   CALCIUM 9.4 04/21/2022   PHOS 2.5 09/18/2021   PROT 6.6 04/21/2022   ALBUMIN 4.3 04/21/2022   LABGLOB 2.2 03/31/2022   AGRATIO 1.9 10/11/2018   BILITOT 0.9 04/21/2022   ALKPHOS 56 04/21/2022   AST 25 04/21/2022   ALT 32 04/21/2022   ANIONGAP 4 (L) 04/21/2022   Last lipids Lab Results  Component Value Date   CHOL 204 (H) 10/11/2018   HDL 45 10/11/2018   LDLCALC 137 (H) 10/11/2018   TRIG 109 10/11/2018   CHOLHDL 4.5 10/11/2018   EKG read by me today shows a sinus rhythm with first-degree AV block.  Ventricular rate is 91.  Other parameters were  normal.  Unchanged from previous EKG.    Assessment & Plan:    Essential hypertension - Plan: EKG 12-Lead, losartan-hydrochlorothiazide (HYZAAR) 100-12.5 MG tablet  Hyperlipidemia, unspecified hyperlipidemia type - Plan: Lipid panel, atorvastatin (LIPITOR) 20 MG tablet  Hemochromatosis, hereditary ([facility])  Obesity (BMI 30-39.9)  Elevated PSA - Plan: PSA  Glucose intolerance (impaired glucose tolerance)  Vitamin D deficiency - Plan: VITAMIN D 25 Hydroxy (Vit-D Deficiency, Fractures)  Multiple myeloma without remission ([facility])  S/P TKR (total knee replacement), left  History of pulmonary embolus (PE)  First degree AV block - Plan: EKG 12-Lead  Arthritis  Immunization History  Administered Date(s) Administered   DTaP / IPV 02/14/2020, 05/16/2020, 08/27/2020   Fluad Quad(high Dose 65+) 05/24/2019, 07/16/2021   Hepatitis B, ped/adol 02/14/2020, 05/16/2020, 11/28/2020   HiB (PRP-T) 02/14/2020, 05/16/2020, 08/27/2020   Influenza Split 07/05/2012   Influenza Whole 07/11/2013, 07/31/2016   Influenza, High Dose Seasonal PF 07/29/2017, 06/27/2018, 05/24/2019   Influenza,inj,Quad PF,6+ Mos 05/22/2014   Influenza-Unspecified 05/22/2014, 08/11/2017, 05/24/2019   MMR 09/15/2021   PFIZER(Purple Top)SARS-COV-2 Vaccination 12/12/2019, 01/02/2020   Pneumococcal Conjugate-13 07/29/2017, 02/14/2020, 05/16/2020, 08/27/2020   Pneumococcal Polysaccharide-23 10/11/2018, 11/28/2020   Tdap 10/28/2012   Zoster Recombinat (Shingrix) 08/11/2017, 08/14/2017, 07/06/2018, 07/06/2018   Zoster, Live 05/16/2014    Health Maintenance  Topic Date Due   DEXA SCAN  Never done   COVID-19 Vaccine (3 - Pfizer risk series) 05/08/2022 (Originally 01/30/2020)   MAMMOGRAM  09/22/2022 (Originally 09/09/2001)   INFLUENZA VACCINE  05/05/2022   TETANUS/TDAP  10/28/2022   COLONOSCOPY (Pts 45-77yr Insurance coverage will need to be confirmed)  10/29/2024   Pneumonia Vaccine 71 Years old  Completed   Hepatitis C  Screening  Completed   Zoster Vaccines- Shingrix  Completed   HPV VACCINES  Aged Out    Discussed health benefits of physical activity, and encouraged him to engage in regular exercise appropriate for his age and condition.  He has  cut down on his alcohol consumption and I congratulated him on that.  Encouraged him to be as physically active as possible.  Continue on present medication regimen and follow-up with Dr. Alvy Bimler  Problem List Items Addressed This Visit     Elevated PSA   Essential hypertension - Primary   Glucose intolerance (impaired glucose tolerance)   Hemochromatosis, hereditary ([place])   Hyperlipidemia   Multiple myeloma without remission ([facility])   Obesity (BMI 30-39.9)   Peripheral neuropathy due to chemotherapy ([facility])   S/P TKR (total knee replacement), left   Vitamin D deficiency   Other Visit Diagnoses     History of pulmonary embolus (PE)          Return in about 1 year (around 04/23/2023) for med check plus .     Jill Alexanders, MD

## 2022-04-22 NOTE — Patient Instructions (Signed)
Health Maintenance, Male Adopting a healthy lifestyle and getting preventive care are important in promoting health and wellness. Ask your health care provider about: The right schedule for you to have regular tests and exams. Things you can do on your own to prevent diseases and keep yourself healthy. What should I know about diet, weight, and exercise? Eat a healthy diet  Eat a diet that includes plenty of vegetables, fruits, low-fat dairy products, and lean protein. Do not eat a lot of foods that are high in solid fats, added sugars, or sodium. Maintain a healthy weight Body mass index (BMI) is a measurement that can be used to identify possible weight problems. It estimates body fat based on height and weight. Your health care provider can help determine your BMI and help you achieve or maintain a healthy weight. Get regular exercise Get regular exercise. This is one of the most important things you can do for your health. Most adults should: Exercise for at least 150 minutes each week. The exercise should increase your heart rate and make you sweat (moderate-intensity exercise). Do strengthening exercises at least twice a week. This is in addition to the moderate-intensity exercise. Spend less time sitting. Even light physical activity can be beneficial. Watch cholesterol and blood lipids Have your blood tested for lipids and cholesterol at 71 years of age, then have this test every 5 years. You may need to have your cholesterol levels checked more often if: Your lipid or cholesterol levels are high. You are older than 71 years of age. You are at high risk for heart disease. What should I know about cancer screening? Many types of cancers can be detected early and may often be prevented. Depending on your health history and family history, you may need to have cancer screening at various ages. This may include screening for: Colorectal cancer. Prostate cancer. Skin cancer. Lung  cancer. What should I know about heart disease, diabetes, and high blood pressure? Blood pressure and heart disease High blood pressure causes heart disease and increases the risk of stroke. This is more likely to develop in people who have high blood pressure readings or are overweight. Talk with your health care provider about your target blood pressure readings. Have your blood pressure checked: Every 3-5 years if you are 18-39 years of age. Every year if you are 40 years old or older. If you are between the ages of 65 and 75 and are a current or former smoker, ask your health care provider if you should have a one-time screening for abdominal aortic aneurysm (AAA). Diabetes Have regular diabetes screenings. This checks your fasting blood sugar level. Have the screening done: Once every three years after age 45 if you are at a normal weight and have a low risk for diabetes. More often and at a younger age if you are overweight or have a high risk for diabetes. What should I know about preventing infection? Hepatitis B If you have a higher risk for hepatitis B, you should be screened for this virus. Talk with your health care provider to find out if you are at risk for hepatitis B infection. Hepatitis C Blood testing is recommended for: Everyone born from 1945 through 1965. Anyone with known risk factors for hepatitis C. Sexually transmitted infections (STIs) You should be screened each year for STIs, including gonorrhea and chlamydia, if: You are sexually active and are younger than 71 years of age. You are older than 71 years of age and your   health care provider tells you that you are at risk for this type of infection. Your sexual activity has changed since you were last screened, and you are at increased risk for chlamydia or gonorrhea. Ask your health care provider if you are at risk. Ask your health care provider about whether you are at high risk for HIV. Your health care provider  may recommend a prescription medicine to help prevent HIV infection. If you choose to take medicine to prevent HIV, you should first get tested for HIV. You should then be tested every 3 months for as long as you are taking the medicine. Follow these instructions at home: Alcohol use Do not drink alcohol if your health care provider tells you not to drink. If you drink alcohol: Limit how much you have to 0-2 drinks a day. Know how much alcohol is in your drink. In the [place] one drink equals one 12 oz bottle of beer (355 mL), one 5 oz glass of wine (148 mL), or one 1 oz glass of hard liquor (44 mL). Lifestyle Do not use any products that contain nicotine or tobacco. These products include cigarettes, chewing tobacco, and vaping devices, such as e-cigarettes. If you need help quitting, ask your health care provider. Do not use street drugs. Do not share needles. Ask your health care provider for help if you need support or information about quitting drugs. General instructions Schedule regular health, dental, and eye exams. Stay current with your vaccines. Tell your health care provider if: You often feel depressed. You have ever been abused or do not feel safe at home. Summary Adopting a healthy lifestyle and getting preventive care are important in promoting health and wellness. Follow your health care provider's instructions about healthy diet, exercising, and getting tested or screened for diseases. Follow your health care provider's instructions on monitoring your cholesterol and blood pressure. This information is not intended to replace advice given to you by your health care provider. Make sure you discuss any questions you have with your health care provider. Document Revised: 02/10/2021 Document Reviewed: 02/10/2021 Elsevier Patient Education  2023 [employer]  

## 2022-04-23 DIAGNOSIS — D0421 Carcinoma in situ of skin of right ear and external auricular canal: Secondary | ICD-10-CM | POA: Diagnosis not present

## 2022-04-23 LAB — LIPID PANEL
Chol/HDL Ratio: 2.6 ratio (ref 0.0–5.0)
Cholesterol, Total: 142 mg/dL (ref 100–199)
HDL: 54 mg/dL (ref >39)
LDL Chol Calc (NIH): 75 mg/dL (ref 0–99)
Triglycerides: 61 mg/dL (ref 0–149)
VLDL Cholesterol Cal: 13 mg/dL (ref 5–40)

## 2022-04-23 LAB — VITAMIN D 25 HYDROXY (VIT D DEFICIENCY, FRACTURES): Vit D, 25-Hydroxy: 46.3 ng/mL (ref 30.0–100.0)

## 2022-04-23 LAB — PSA: Prostate Specific Ag, Serum: 3.9 ng/mL (ref 0.0–4.0)

## 2022-04-24 LAB — MULTIPLE MYELOMA PANEL, SERUM
Albumin SerPl Elph-Mcnc: 3.7 g/dL (ref 2.9–4.4)
Albumin/Glob SerPl: 1.7 (ref 0.7–1.7)
Alpha 1: 0.2 g/dL (ref 0.0–0.4)
Alpha2 Glob SerPl Elph-Mcnc: 0.8 g/dL (ref 0.4–1.0)
B-Globulin SerPl Elph-Mcnc: 0.8 g/dL (ref 0.7–1.3)
Gamma Glob SerPl Elph-Mcnc: 0.5 g/dL (ref 0.4–1.8)
Globulin, Total: 2.3 g/dL (ref 2.2–3.9)
IgA: 15 mg/dL — ABNORMAL LOW (ref 61–437)
IgG (Immunoglobin G), Serum: 575 mg/dL — ABNORMAL LOW (ref 603–1613)
IgM (Immunoglobulin M), Srm: 13 mg/dL — ABNORMAL LOW (ref 20–172)
M Protein SerPl Elph-Mcnc: 0.2 g/dL — ABNORMAL HIGH
Total Protein ELP: 6 g/dL (ref 6.0–8.5)

## 2022-04-27 ENCOUNTER — Other Ambulatory Visit: Payer: Self-pay

## 2022-05-01 DIAGNOSIS — Z85828 Personal history of other malignant neoplasm of skin: Secondary | ICD-10-CM | POA: Insufficient documentation

## 2022-05-01 DIAGNOSIS — C4441 Basal cell carcinoma of skin of scalp and neck: Secondary | ICD-10-CM | POA: Insufficient documentation

## 2022-05-05 ENCOUNTER — Other Ambulatory Visit: Payer: Self-pay

## 2022-05-05 ENCOUNTER — Other Ambulatory Visit: Payer: Self-pay | Admitting: Hematology and Oncology

## 2022-05-05 DIAGNOSIS — C9000 Multiple myeloma not having achieved remission: Secondary | ICD-10-CM

## 2022-05-06 ENCOUNTER — Telehealth: Payer: Self-pay | Admitting: *Deleted

## 2022-05-06 ENCOUNTER — Other Ambulatory Visit: Payer: Self-pay | Admitting: Hematology

## 2022-05-06 DIAGNOSIS — M7918 Myalgia, other site: Secondary | ICD-10-CM

## 2022-05-06 MED ORDER — OXYCODONE HCL 5 MG PO TABS: 5.0000 mg | ORAL_TABLET | Freq: Four times a day (QID) | ORAL | 0 refills | Status: DC | PRN

## 2022-05-06 NOTE — Telephone Encounter (Addendum)
Patient called and requested refill of oxycodone. States he is exercising and walking more which has increased his back pain some.  Message routed to Dr. Burr Medico in Dr. Alvy Bimler absence.  Contacted patient - Notified patient that Dr. Alvy Bimler out of office at this time and that another MD at [facility] will be filling his prescription. He verbalized understanding. He states to send his refill to [facility] on [address] @ [address]. Message to provider updated.

## 2022-05-07 ENCOUNTER — Encounter: Payer: Self-pay | Admitting: Hematology and Oncology

## 2022-05-07 NOTE — Telephone Encounter (Signed)
Notified patient that requested rx sent to [facility]. Patient verbalized understanding

## 2022-05-11 ENCOUNTER — Other Ambulatory Visit: Payer: Self-pay

## 2022-05-11 ENCOUNTER — Inpatient Hospital Stay: Payer: Medicare Other

## 2022-05-11 ENCOUNTER — Encounter: Payer: Self-pay | Admitting: Hematology and Oncology

## 2022-05-11 ENCOUNTER — Inpatient Hospital Stay: Payer: Medicare Other | Attending: Hematology and Oncology

## 2022-05-11 ENCOUNTER — Inpatient Hospital Stay (HOSPITAL_BASED_OUTPATIENT_CLINIC_OR_DEPARTMENT_OTHER): Payer: Medicare Other | Admitting: Hematology and Oncology

## 2022-05-11 VITALS — BP 124/78 | HR 93 | Resp 18

## 2022-05-11 DIAGNOSIS — Z5112 Encounter for antineoplastic immunotherapy: Secondary | ICD-10-CM | POA: Diagnosis not present

## 2022-05-11 DIAGNOSIS — C9000 Multiple myeloma not having achieved remission: Secondary | ICD-10-CM | POA: Diagnosis not present

## 2022-05-11 DIAGNOSIS — I700 Atherosclerosis of aorta: Secondary | ICD-10-CM | POA: Insufficient documentation

## 2022-05-11 DIAGNOSIS — Z7901 Long term (current) use of anticoagulants: Secondary | ICD-10-CM | POA: Diagnosis not present

## 2022-05-11 DIAGNOSIS — Z79899 Other long term (current) drug therapy: Secondary | ICD-10-CM | POA: Insufficient documentation

## 2022-05-11 DIAGNOSIS — I2510 Atherosclerotic heart disease of native coronary artery without angina pectoris: Secondary | ICD-10-CM | POA: Insufficient documentation

## 2022-05-11 DIAGNOSIS — Z923 Personal history of irradiation: Secondary | ICD-10-CM | POA: Diagnosis not present

## 2022-05-11 DIAGNOSIS — Z79624 Long term (current) use of inhibitors of nucleotide synthesis: Secondary | ICD-10-CM | POA: Diagnosis not present

## 2022-05-11 LAB — CBC WITH DIFFERENTIAL (CANCER CENTER ONLY)
Abs Immature Granulocytes: 0 10*3/uL (ref 0.00–0.07)
Basophils Absolute: 0 10*3/uL (ref 0.0–0.1)
Basophils Relative: 0 %
Eosinophils Absolute: 0.1 10*3/uL (ref 0.0–0.5)
Eosinophils Relative: 1 %
HCT: 41.8 % (ref 39.0–52.0)
Hemoglobin: 14.8 g/dL (ref 13.0–17.0)
Immature Granulocytes: 0 %
Lymphocytes Relative: 38 %
Lymphs Abs: 2.1 10*3/uL (ref 0.7–4.0)
MCH: 36 pg — ABNORMAL HIGH (ref 26.0–34.0)
MCHC: 35.4 g/dL (ref 30.0–36.0)
MCV: 101.7 fL — ABNORMAL HIGH (ref 80.0–100.0)
Monocytes Absolute: 1 10*3/uL (ref 0.1–1.0)
Monocytes Relative: 17 %
Neutro Abs: 2.5 10*3/uL (ref 1.7–7.7)
Neutrophils Relative %: 44 %
Platelet Count: 146 10*3/uL — ABNORMAL LOW (ref 150–400)
RBC: 4.11 MIL/uL — ABNORMAL LOW (ref 4.22–5.81)
RDW: 12.1 % (ref 11.5–15.5)
WBC Count: 5.6 10*3/uL (ref 4.0–10.5)
nRBC: 0 % (ref 0.0–0.2)

## 2022-05-11 LAB — CMP (CANCER CENTER ONLY)
ALT: 30 U/L (ref 0–44)
AST: 22 U/L (ref 15–41)
Albumin: 4.2 g/dL (ref 3.5–5.0)
Alkaline Phosphatase: 55 U/L (ref 38–126)
Anion gap: 5 (ref 5–15)
BUN: 23 mg/dL (ref 8–23)
CO2: 29 mmol/L (ref 22–32)
Calcium: 9 mg/dL (ref 8.9–10.3)
Chloride: 104 mmol/L (ref 98–111)
Creatinine: 0.77 mg/dL (ref 0.61–1.24)
GFR, Estimated: >60 mL/min (ref >60)
Glucose, Bld: 95 mg/dL (ref 70–99)
Potassium: 4.1 mmol/L (ref 3.5–5.1)
Sodium: 138 mmol/L (ref 135–145)
Total Bilirubin: 0.8 mg/dL (ref 0.3–1.2)
Total Protein: 6.5 g/dL (ref 6.5–8.1)

## 2022-05-11 MED ORDER — DIPHENHYDRAMINE HCL 25 MG PO CAPS
25.0000 mg | ORAL_CAPSULE | Freq: Once | ORAL | Status: AC
  Administered 2022-05-11: 25 mg via ORAL
  Filled 2022-05-11: qty 1

## 2022-05-11 MED ORDER — DARATUMUMAB-HYALURONIDASE-FIHJ 1800-30000 MG-UT/15ML ~~LOC~~ SOLN
1800.0000 mg | Freq: Once | SUBCUTANEOUS | Status: AC
  Administered 2022-05-11: 1800 mg via SUBCUTANEOUS
  Filled 2022-05-11: qty 15

## 2022-05-11 MED ORDER — ACETAMINOPHEN 325 MG PO TABS
650.0000 mg | ORAL_TABLET | Freq: Once | ORAL | Status: AC
  Administered 2022-05-11: 650 mg via ORAL
  Filled 2022-05-11: qty 2

## 2022-05-11 NOTE — Assessment & Plan Note (Signed)
His recent myeloma panel show stability We will proceed with treatment as scheduled Next month, he will be switched to maintenance daratumumab only Zometa will be every 6 months, due in September

## 2022-05-11 NOTE — Progress Notes (Signed)
[facility] OFFICE PROGRESS NOTE  Patient Care Team: Denita Lung, MD as PCP - General (Family Medicine) Heath Lark, MD as Consulting Physician (Hematology and Oncology)  ASSESSMENT & PLAN:  Multiple myeloma without remission [facility]) His recent myeloma panel show stability We will proceed with treatment as scheduled Next month, he will be switched to maintenance daratumumab only Zometa will be every 6 months, due in September  Hemochromatosis, hereditary We discussed the timing of phlebotomy We will arrange for phlebotomy today  No orders of the defined types were placed in this encounter.   All questions were answered. The patient knows to call the clinic with any problems, questions or concerns. The total time spent in the appointment was 20 minutes encounter with patients including review of chart and various tests results, discussions about plan of care and coordination of care plan   Heath Lark, MD 05/11/2022 1:54 PM  INTERVAL HISTORY: Please see below for problem oriented charting. he returns for treatment follow-up on maintenance daratumumab only He is doing well No recent infection His back pain is stable He denies recent dental issues  REVIEW OF SYSTEMS:   Constitutional: Denies fevers, chills or abnormal weight loss Eyes: Denies blurriness of vision Ears, nose, mouth, throat, and face: Denies mucositis or sore throat Respiratory: Denies cough, dyspnea or wheezes Cardiovascular: Denies palpitation, chest discomfort or lower extremity swelling Gastrointestinal:  Denies nausea, heartburn or change in bowel habits Skin: Denies abnormal skin rashes Lymphatics: Denies new lymphadenopathy or easy bruising Neurological:Denies numbness, tingling or new weaknesses Behavioral/Psych: Mood is stable, no new changes  All other systems were reviewed with the patient and are negative.  I have reviewed the past medical history, past surgical history, social  history and family history with the patient and they are unchanged from previous note.  ALLERGIES:  has No Known Allergies.  MEDICATIONS:  Current Outpatient Medications  Medication Sig Dispense Refill   acetaminophen (TYLENOL) 650 MG CR tablet Take 650 mg by mouth every 8 (eight) hours as needed for pain.     acyclovir (ZOVIRAX) 400 MG tablet Take 1 tablet (400 mg total) by mouth daily. 90 tablet 1   atorvastatin (LIPITOR) 20 MG tablet Take 1 tablet (20 mg total) by mouth daily. 90 tablet 3   calcium carbonate (TUMS - DOSED IN MG ELEMENTAL CALCIUM) 500 MG chewable tablet Chew 1-2 tablets by mouth daily as needed for heartburn. (Patient not taking: Reported on 04/22/2022)     Cholecalciferol (VITAMIN D) 50 MCG (2000 UT) tablet Take 2,000 Units by mouth daily.     folic acid (FOLVITE) 119 MCG tablet Take 400 mcg by mouth daily.     losartan-hydrochlorothiazide (HYZAAR) 100-12.5 MG tablet Take 1 tablet by mouth daily. 90 tablet 3   Multiple Vitamin (MULTI-VITAMIN) tablet Take 1 tablet by mouth daily.     ondansetron (ZOFRAN) 8 MG tablet Take 1 tablet (8 mg total) by mouth 2 (two) times daily as needed (Nausea or vomiting). (Patient not taking: Reported on 04/22/2022) 30 tablet 1   oxyCODONE (ROXICODONE) 5 MG immediate release tablet Take 1 tablet (5 mg total) by mouth every 6 (six) hours as needed for severe pain. 60 tablet 0   Polyethyl Glycol-Propyl Glycol (SYSTANE OP) Place 1 drop into both eyes daily as needed (dry eyes).     prochlorperazine (COMPAZINE) 10 MG tablet Take 1 tablet (10 mg total) by mouth every 6 (six) hours as needed (Nausea or vomiting). 30 tablet 1   vitamin  B-12 (CYANOCOBALAMIN) 1000 MCG tablet Take 1,000 mcg by mouth daily.     XARELTO 20 MG TABS tablet TAKE 1 TABLET(20 MG) BY MOUTH DAILY WITH SUPPER 90 tablet 1   No current facility-administered medications for this visit.    SUMMARY OF ONCOLOGIC HISTORY: Oncology History  Multiple myeloma without remission ([place])   03/20/2019 Imaging   1. Tumor involving the L5 and S1 and S2 segments of the spine as described with mass effects upon the left L4, L5, S1 and more distal left sacral nerves as described above. 2. Does the patient have a history of malignancy? 3. Multiple plasmacytomas and metastatic disease could give this appearance   03/27/2019 Pathology Results   Soft Tissue Needle Core Biopsy, sacral mass - PLASMABLASTIC NEOPLASM. - SEE COMMENT. Microscopic Comment The sections show needle core biopsy fragments of soft tissue densely infiltrated by a relatively monomorphic infiltrate of atypical plasmacytoid cells characterized by vesicular or partially clumped chromatin and prominent nucleoli. This is associated with scattered mitosis. A battery of immunohistochemical stains was performed and show that the atypical plasmacytoid cells are positive for CD138, CD43 and cytoplasmic kappa. There is also weak positivity for CD56 and partial variable positivity for cyclin D1. The atypical plasmacytoid cells are negative for cytoplasmic lambda, CD10, PAX5, CD79a, CD20, CD3, CD5, CD34, EBV (ISH) and mostly negative for LCA. The overall morphologic and histologic features are most compatible with a plasmablastic neoplasm. The differential diagnosis includes plasmablastic lymphoma and plasmablastic plasmacytoma/myeloma. Based on the overall phenotypic features and the clinical setting, plasmacytoma/myeloma is favored. Clinical correlation and hematologic evaluation is recommended   03/27/2019 Procedure   Technically successful CT-guided biopsy of sacral soft tissue mass.   04/04/2019 Initial Diagnosis   Multiple myeloma without remission ([place])   04/11/2019 PET scan   IMPRESSION: 1. Previously noted expansile lesions involving the L5 vertebra and sacrum are again noted and exhibit intense FDG uptake compatible with metabolically active disease. A third focus of increased uptake without corresponding CT abnormality is  noted within the intertrochanteric portions of the proximal right femur. 2. Small lucent lesion within the T3 vertebra is noted without corresponding FDG uptake. 3. Asymmetric left bladder wall thickening, etiology indeterminate. 4. Aortic Atherosclerosis (ICD10-I70.0). Lad coronary artery calcification.   04/12/2019 Cancer Staging   Staging form: Plasma Cell Myeloma and Plasma Cell Disorders, AJCC 8th Edition - Clinical stage from 04/12/2019: Beta-2-microglobulin (mg/L): 2, Albumin (g/dL): 3.6, ISS: Stage I, High-risk cytogenetics: Unknown, LDH: Unknown - Signed by Heath Lark, MD on 04/12/2019   04/14/2019 Bone Marrow Biopsy   Bone Marrow, Aspirate,Biopsy, and Clot, right iliac bone - PLASMA CELL MYELOMA.   04/17/2019 - 07/11/2019 Chemotherapy   The patient had bortezemib, revlimid and dexamethasone for chemotherapy treatment.     08/17/2019 Bone Marrow Transplant   He received high dose melphalan followed by autologous stem cell transplant at [facility]   11/22/2019 Bone Marrow Biopsy   BONE MARROW biopsy at [facility]:       Mild monoclonal plasmacytosis (approximately 2%), kappa light chain restricted.    11/22/2019 PET scan   PET CT at [facility] 1.  Similar size and appearance of mildly hypermetabolic lytic lesion in the L5 vertebral body and posterior elements.  2.  Otherwise, no substantial FDG uptake compared to background bone marrow in the additional osseous lucent lesions.    12/20/2019 - 05/09/2020 Radiation Therapy   Radiation Treatment Dates: 12/20/2019 through 01/08/2020 Site Technique Total Dose (Gy) Dose per Fx (Gy) Completed Fx  Beam Energies  Lumbar Spine: Spine 3D 35/35 2.5 14/14 15X        02/29/2020 PET scan   1. Similar size and appearance of expansile lytic lesion in the L5 vertebral body and posterior elements with minimal FDG activity.  2. Otherwise, no additional osseous lucent lesions with FDG uptake above background bone marrow   05/31/2020 -  Chemotherapy   The  patient had Revlimid for chemotherapy treatment.     11/25/2021 -  Chemotherapy   Patient is on Treatment Plan : MYELOMA RELAPSED / REFRACTORY Daratumumab SQ + Bortezomib + Dexamethasone (DaraVd) q21d / Daratumumab SQ q28d        PHYSICAL EXAMINATION: ECOG PERFORMANCE STATUS: 0 - Asymptomatic  Vitals:   05/11/22 1113  BP: (!) 146/81  Pulse: 81  Resp: 18  SpO2: 97%   Filed Weights   05/11/22 1113  Weight: 236 lb 3.2 oz (107.1 kg)    GENERAL:alert, no distress and comfortable NEURO: alert & oriented x 3 with fluent speech, no focal motor/sensory deficits  LABORATORY DATA:  I have reviewed the data as listed    Component Value Date/Time   NA 138 05/11/2022 1055   NA 139 10/11/2018 1115   NA 139 04/05/2014 0849   K 4.1 05/11/2022 1055   K 4.4 04/05/2014 0849   CL 104 05/11/2022 1055   CO2 29 05/11/2022 1055   CO2 23 04/05/2014 0849   GLUCOSE 95 05/11/2022 1055   GLUCOSE 109 04/05/2014 0849   BUN 23 05/11/2022 1055   BUN 23 10/11/2018 1115   BUN 16.8 04/05/2014 0849   CREATININE 0.77 05/11/2022 1055   CREATININE 0.91 07/29/2017 1004   CREATININE 0.9 04/05/2014 0849   CALCIUM 9.0 05/11/2022 1055   CALCIUM 8.7 04/05/2014 0849   PROT 6.5 05/11/2022 1055   PROT 7.2 10/11/2018 1115   PROT 6.7 04/05/2014 0849   ALBUMIN 4.2 05/11/2022 1055   ALBUMIN 4.7 10/11/2018 1115   ALBUMIN 3.8 04/05/2014 0849   AST 22 05/11/2022 1055   AST 26 04/05/2014 0849   ALT 30 05/11/2022 1055   ALT 30 04/05/2014 0849   ALKPHOS 55 05/11/2022 1055   ALKPHOS 75 04/05/2014 0849   BILITOT 0.8 05/11/2022 1055   BILITOT 0.71 04/05/2014 0849   GFRNONAA >60 05/11/2022 1055   GFRAA >60 06/14/2020 0830    No results found for: "SPEP", "UPEP"  Lab Results  Component Value Date   WBC 5.6 05/11/2022   NEUTROABS 2.5 05/11/2022   HGB 14.8 05/11/2022   HCT 41.8 05/11/2022   MCV 101.7 (H) 05/11/2022   PLT 146 (L) 05/11/2022      Chemistry      Component Value Date/Time   NA 138  05/11/2022 1055   NA 139 10/11/2018 1115   NA 139 04/05/2014 0849   K 4.1 05/11/2022 1055   K 4.4 04/05/2014 0849   CL 104 05/11/2022 1055   CO2 29 05/11/2022 1055   CO2 23 04/05/2014 0849   BUN 23 05/11/2022 1055   BUN 23 10/11/2018 1115   BUN 16.8 04/05/2014 0849   CREATININE 0.77 05/11/2022 1055   CREATININE 0.91 07/29/2017 1004   CREATININE 0.9 04/05/2014 0849   GLU 10 04/10/2014 0000      Component Value Date/Time   CALCIUM 9.0 05/11/2022 1055   CALCIUM 8.7 04/05/2014 0849   ALKPHOS 55 05/11/2022 1055   ALKPHOS 75 04/05/2014 0849   AST 22 05/11/2022 1055   AST 26 04/05/2014 0849   ALT 30  05/11/2022 1055   ALT 30 04/05/2014 0849   BILITOT 0.8 05/11/2022 1055   BILITOT 0.71 04/05/2014 0849

## 2022-05-11 NOTE — Assessment & Plan Note (Signed)
We discussed the timing of phlebotomy We will arrange for phlebotomy today

## 2022-05-11 NOTE — Progress Notes (Signed)
Jesse Mcclure. presents today for phlebotomy per Dr. Calton Dach MD orders. Phlebotomy procedure started at 1208 and ended at 1215. 532 grams removed via 16 G needle to RAC. IV needle removed intact. Patient observed for 30 minutes after procedure without any incident. Patient tolerated procedure well. Food and drink provided.

## 2022-05-11 NOTE — Patient Instructions (Signed)
[facility]COL[facility]   Discharge Instructions: Thank you for choosing [facility] to provide your oncology and hematology care.   If you have a lab appointment with the [facility], please go directly to the [facility] and check in at the registration area.   Wear comfortable clothing and clothing appropriate for easy access to any Portacath or PICC line.   We strive to give you quality time with your provider. You may need to reschedule your appointment if you arrive late (15 or more minutes).  Arriving late affects you and other patients whose appointments are after yours.  Also, if you miss three or more appointments without notifying the office, you may be dismissed from the clinic at the provider's discretion.      For prescription refill requests, have your pharmacy contact our office and allow 72 hours for refills to be completed.    Today you received the following chemotherapy and/or immunotherapy agents: Daratumumab-hyaluronidase-fihj (DARZALEX FASPRO)       To help prevent nausea and vomiting after your treatment, we encourage you to take your nausea medication as directed.  BELOW ARE SYMPTOMS THAT SHOULD BE REPORTED IMMEDIATELY: *FEVER GREATER THAN 100.4 F (38 C) OR HIGHER *CHILLS OR SWEATING *NAUSEA AND VOMITING THAT IS NOT CONTROLLED WITH YOUR NAUSEA MEDICATION *UNUSUAL SHORTNESS OF BREATH *UNUSUAL BRUISING OR BLEEDING *URINARY PROBLEMS (pain or burning when urinating, or frequent urination) *BOWEL PROBLEMS (unusual diarrhea, constipation, pain near the anus) TENDERNESS IN MOUTH AND THROAT WITH OR WITHOUT PRESENCE OF ULCERS (sore throat, sores in mouth, or a toothache) UNUSUAL RASH, SWELLING OR PAIN  UNUSUAL VAGINAL DISCHARGE OR ITCHING   Items with * indicate a potential emergency and should be followed up as soon as possible or go to the Emergency Department if any problems should occur.  Please show the CHEMOTHERAPY ALERT CARD  or IMMUNOTHERAPY ALERT CARD at check-in to the Emergency Department and triage nurse.  Should you have questions after your visit or need to cancel or reschedule your appointment, please contact [facility]  Dept: [phone]  and follow the prompts.  Office hours are 8:00 a.m. to 4:30 p.m. Monday - Friday. Please note that voicemails left after 4:00 p.m. may not be returned until the following business day.  We are closed weekends and major holidays. You have access to a nurse at all times for urgent questions. Please call the main number to the clinic Dept: [phone] and follow the prompts.   For any non-urgent questions, you may also contact your provider using MyChart. We now offer e-Visits for anyone 28 and older to request care online for non-urgent symptoms. For details visit [email or web address].   Also download the MyChart app! Go to the app store, search "MyChart", open the app, select [facility], and log in with your MyChart username and password.  Masks are optional in the cancer centers. If you would like for your care team to wear a mask while they are taking care of you, please let them know. You may have one support person who is at least 71 years old accompany you for your appointments.

## 2022-05-12 LAB — KAPPA/LAMBDA LIGHT CHAINS
Kappa free light chain: 10 mg/L (ref 3.3–19.4)
Kappa, lambda light chain ratio: 1.85 — ABNORMAL HIGH (ref 0.26–1.65)
Lambda free light chains: 5.4 mg/L — ABNORMAL LOW (ref 5.7–26.3)

## 2022-05-14 LAB — MULTIPLE MYELOMA PANEL, SERUM
Albumin SerPl Elph-Mcnc: 3.7 g/dL (ref 2.9–4.4)
Albumin/Glob SerPl: 1.7 (ref 0.7–1.7)
Alpha 1: 0.2 g/dL (ref 0.0–0.4)
Alpha2 Glob SerPl Elph-Mcnc: 0.7 g/dL (ref 0.4–1.0)
B-Globulin SerPl Elph-Mcnc: 0.8 g/dL (ref 0.7–1.3)
Gamma Glob SerPl Elph-Mcnc: 0.5 g/dL (ref 0.4–1.8)
Globulin, Total: 2.2 g/dL (ref 2.2–3.9)
IgA: 15 mg/dL — ABNORMAL LOW (ref 61–437)
IgG (Immunoglobin G), Serum: 532 mg/dL — ABNORMAL LOW (ref 603–1613)
IgM (Immunoglobulin M), Srm: 12 mg/dL — ABNORMAL LOW (ref 20–172)
M Protein SerPl Elph-Mcnc: 0.2 g/dL — ABNORMAL HIGH
Total Protein ELP: 5.9 g/dL — ABNORMAL LOW (ref 6.0–8.5)

## 2022-05-25 ENCOUNTER — Telehealth: Payer: Self-pay

## 2022-05-25 ENCOUNTER — Other Ambulatory Visit: Payer: Self-pay | Admitting: Hematology and Oncology

## 2022-05-25 DIAGNOSIS — G8929 Other chronic pain: Secondary | ICD-10-CM

## 2022-05-25 MED ORDER — OXYCODONE HCL 5 MG PO TABS: 5.0000 mg | ORAL_TABLET | Freq: Four times a day (QID) | ORAL | 0 refills | Status: DC | PRN

## 2022-05-25 NOTE — Telephone Encounter (Signed)
He called and left a message requesting Oxycodone refill to pharmacy.

## 2022-05-25 NOTE — Telephone Encounter (Signed)
done

## 2022-05-25 NOTE — Telephone Encounter (Signed)
Called and told him Rx sent. He appreciated the call.

## 2022-06-04 ENCOUNTER — Other Ambulatory Visit: Payer: Self-pay | Admitting: Hematology and Oncology

## 2022-06-04 DIAGNOSIS — C9000 Multiple myeloma not having achieved remission: Secondary | ICD-10-CM

## 2022-06-04 NOTE — Progress Notes (Signed)
ON PATHWAY REGIMEN - Multiple Myeloma and Other Plasma Cell Dyscrasias  No Change  Continue With Treatment as Ordered.  Original Decision Date/Time: 10/22/2021 10:35     Cycles 1 through 3: A cycle is every 21 days:     Dexamethasone      Bortezomib      Daratumumab and hyaluronidase-fihj    Cycles 4 through 8: A cycle is every 21 days:     Dexamethasone      Bortezomib      Daratumumab and hyaluronidase-fihj    Cycles 9 and beyond: A cycle is every 28 days:     Daratumumab and hyaluronidase-fihj   **Always confirm dose/schedule in your pharmacy ordering system**  Patient Characteristics: Multiple Myeloma, Relapsed / Refractory, Second through [place] of Therapy, Fit or Candidate for Triplet Therapy, Lenalidomide-Refractory or Lenalidomide-based Regimen Not Preferred, Candidate for Anti-CD38 Antibody Disease Classification: Multiple Myeloma R-ISS Staging: II Therapeutic Status: Relapsed Line of Therapy: Second Line Anti-CD38 Antibody Candidacy: Candidate for Anti-CD38 Antibody Lenalidomide-based Regimen Preference/Candidacy: Lenalidomide-based Regimen Not Preferred Intent of Therapy: Non-Curative / Palliative Intent, Discussed with Patient

## 2022-06-10 ENCOUNTER — Ambulatory Visit
Admission: RE | Admit: 2022-06-10 | Discharge: 2022-06-10 | Disposition: A | Discharge status: Home / Self Care | Payer: Medicare Other | Source: Ambulatory Visit | Attending: Family Medicine | Admitting: Family Medicine

## 2022-06-10 ENCOUNTER — Encounter: Payer: Self-pay | Admitting: Family Medicine

## 2022-06-10 ENCOUNTER — Ambulatory Visit: Payer: Medicare Other | Admitting: Family Medicine

## 2022-06-10 ENCOUNTER — Other Ambulatory Visit (INDEPENDENT_AMBULATORY_CARE_PROVIDER_SITE_OTHER): Payer: Medicare Other

## 2022-06-10 ENCOUNTER — Ambulatory Visit (INDEPENDENT_AMBULATORY_CARE_PROVIDER_SITE_OTHER): Payer: Medicare Other | Admitting: Family Medicine

## 2022-06-10 VITALS — BP 112/78 | HR 96 | Temp 98.4°F | Wt 238.8 lb

## 2022-06-10 DIAGNOSIS — R062 Wheezing: Secondary | ICD-10-CM

## 2022-06-10 DIAGNOSIS — R051 Acute cough: Secondary | ICD-10-CM | POA: Diagnosis not present

## 2022-06-10 DIAGNOSIS — J029 Acute pharyngitis, unspecified: Secondary | ICD-10-CM

## 2022-06-10 DIAGNOSIS — R059 Cough, unspecified: Secondary | ICD-10-CM | POA: Diagnosis not present

## 2022-06-10 DIAGNOSIS — R0989 Other specified symptoms and signs involving the circulatory and respiratory systems: Secondary | ICD-10-CM

## 2022-06-10 LAB — POC COVID19 BINAXNOW: SARS Coronavirus 2 Ag: NEGATIVE

## 2022-06-10 LAB — POCT INFLUENZA A/B
Influenza A, POC: NEGATIVE
Influenza B, POC: NEGATIVE

## 2022-06-10 MED ORDER — ALBUTEROL SULFATE HFA 108 (90 BASE) MCG/ACT IN AERS: 2.0000 | INHALATION_SPRAY | Freq: Four times a day (QID) | RESPIRATORY_TRACT | 0 refills | Status: DC | PRN

## 2022-06-10 MED ORDER — AZITHROMYCIN 500 MG PO TABS: 500.0000 mg | ORAL_TABLET | Freq: Every day | ORAL | 0 refills | Status: DC

## 2022-06-10 NOTE — Progress Notes (Signed)
   Subjective:    Patient ID: Jesse Guest., adult    DOB: 1951-09-16, 71 y.o.   MRN: [id number]  HPI He states that 4 days ago he developed some wheezing, coughing, chest congestion and now having difficulty with nasal congestion, slight sore throat and hoarse voice but no fever, chills, fatigue.  Has no previous history of difficulty with wheezing.  He is followed regularly by oncology for his underlying multiple myeloma.   Review of Systems     Objective:   Physical Exam Alert and in no distress. Tympanic membranes and canals are normal. Pharyngeal area is normal. Neck is supple without adenopathy or thyromegaly. Cardiac exam shows a regular sinus rhythm without murmurs or gallops. Lungs show inspiratory as well as expiratory wheezing.       Assessment & Plan:  Wheezing - Plan: albuterol (VENTOLIN HFA) 108 (90 Base) MCG/ACT inhaler  Acute cough - Plan: DG Chest 2 View, azithromycin (ZITHROMAX) 500 MG tablet  Chest congestion - Plan: DG Chest 2 View, azithromycin (ZITHROMAX) 500 MG tablet This is the first time he has ever had difficulty with wheezing and I will therefore get a chest x-ray on him.  He is scheduled to see oncology tomorrow for blood work.  I will go ahead and treat him and see how he does

## 2022-06-11 ENCOUNTER — Inpatient Hospital Stay: Payer: Medicare Other | Attending: Hematology and Oncology

## 2022-06-11 ENCOUNTER — Inpatient Hospital Stay: Payer: Medicare Other

## 2022-06-11 ENCOUNTER — Inpatient Hospital Stay (HOSPITAL_BASED_OUTPATIENT_CLINIC_OR_DEPARTMENT_OTHER): Payer: Medicare Other | Admitting: Hematology and Oncology

## 2022-06-11 ENCOUNTER — Other Ambulatory Visit: Payer: Self-pay

## 2022-06-11 ENCOUNTER — Encounter: Payer: Self-pay | Admitting: Hematology and Oncology

## 2022-06-11 ENCOUNTER — Other Ambulatory Visit: Payer: Self-pay | Admitting: Hematology and Oncology

## 2022-06-11 VITALS — BP 120/88 | HR 92 | Temp 97.9°F | Resp 18 | Ht 68.5 in | Wt 238.8 lb

## 2022-06-11 DIAGNOSIS — G8929 Other chronic pain: Secondary | ICD-10-CM | POA: Insufficient documentation

## 2022-06-11 DIAGNOSIS — I2510 Atherosclerotic heart disease of native coronary artery without angina pectoris: Secondary | ICD-10-CM | POA: Diagnosis not present

## 2022-06-11 DIAGNOSIS — Z79624 Long term (current) use of inhibitors of nucleotide synthesis: Secondary | ICD-10-CM | POA: Diagnosis not present

## 2022-06-11 DIAGNOSIS — Z5112 Encounter for antineoplastic immunotherapy: Secondary | ICD-10-CM | POA: Diagnosis not present

## 2022-06-11 DIAGNOSIS — I700 Atherosclerosis of aorta: Secondary | ICD-10-CM | POA: Insufficient documentation

## 2022-06-11 DIAGNOSIS — Z79899 Other long term (current) drug therapy: Secondary | ICD-10-CM | POA: Insufficient documentation

## 2022-06-11 DIAGNOSIS — C9000 Multiple myeloma not having achieved remission: Secondary | ICD-10-CM | POA: Diagnosis not present

## 2022-06-11 DIAGNOSIS — M549 Dorsalgia, unspecified: Secondary | ICD-10-CM | POA: Insufficient documentation

## 2022-06-11 DIAGNOSIS — M7918 Myalgia, other site: Secondary | ICD-10-CM

## 2022-06-11 DIAGNOSIS — Z7901 Long term (current) use of anticoagulants: Secondary | ICD-10-CM | POA: Diagnosis not present

## 2022-06-11 LAB — CBC WITH DIFFERENTIAL (CANCER CENTER ONLY)
Abs Immature Granulocytes: 0.01 10*3/uL (ref 0.00–0.07)
Basophils Absolute: 0 10*3/uL (ref 0.0–0.1)
Basophils Relative: 0 %
Eosinophils Absolute: 0.2 10*3/uL (ref 0.0–0.5)
Eosinophils Relative: 3 %
HCT: 43 % (ref 39.0–52.0)
Hemoglobin: 14.8 g/dL (ref 13.0–17.0)
Immature Granulocytes: 0 %
Lymphocytes Relative: 38 %
Lymphs Abs: 2.3 10*3/uL (ref 0.7–4.0)
MCH: 35.4 pg — ABNORMAL HIGH (ref 26.0–34.0)
MCHC: 34.4 g/dL (ref 30.0–36.0)
MCV: 102.9 fL — ABNORMAL HIGH (ref 80.0–100.0)
Monocytes Absolute: 1.2 10*3/uL — ABNORMAL HIGH (ref 0.1–1.0)
Monocytes Relative: 19 %
Neutro Abs: 2.3 10*3/uL (ref 1.7–7.7)
Neutrophils Relative %: 40 %
Platelet Count: 175 10*3/uL (ref 150–400)
RBC: 4.18 MIL/uL — ABNORMAL LOW (ref 4.22–5.81)
RDW: 13.2 % (ref 11.5–15.5)
WBC Count: 5.9 10*3/uL (ref 4.0–10.5)
nRBC: 0 % (ref 0.0–0.2)

## 2022-06-11 MED ORDER — ZOLEDRONIC ACID 4 MG/100ML IV SOLN
4.0000 mg | Freq: Once | INTRAVENOUS | Status: AC
  Administered 2022-06-11: 4 mg via INTRAVENOUS
  Filled 2022-06-11: qty 100

## 2022-06-11 MED ORDER — OXYCODONE HCL 5 MG PO TABS: 5.0000 mg | ORAL_TABLET | Freq: Four times a day (QID) | ORAL | 0 refills | Status: DC | PRN

## 2022-06-11 MED ORDER — DIPHENHYDRAMINE HCL 25 MG PO CAPS
25.0000 mg | ORAL_CAPSULE | Freq: Once | ORAL | Status: AC
  Administered 2022-06-11: 25 mg via ORAL
  Filled 2022-06-11: qty 1

## 2022-06-11 MED ORDER — DARATUMUMAB-HYALURONIDASE-FIHJ 1800-30000 MG-UT/15ML ~~LOC~~ SOLN
1800.0000 mg | Freq: Once | SUBCUTANEOUS | Status: AC
  Administered 2022-06-11: 1800 mg via SUBCUTANEOUS
  Filled 2022-06-11: qty 15

## 2022-06-11 MED ORDER — SODIUM CHLORIDE 0.9 % IV SOLN: Freq: Once | INTRAVENOUS | Status: AC

## 2022-06-11 MED ORDER — ACETAMINOPHEN 325 MG PO TABS
650.0000 mg | ORAL_TABLET | Freq: Once | ORAL | Status: AC
  Administered 2022-06-11: 650 mg via ORAL
  Filled 2022-06-11: qty 2

## 2022-06-11 NOTE — Assessment & Plan Note (Signed)
His recent myeloma panel show stability We will proceed with treatment as scheduled He will be continue maintenance daratumumab only Zometa will be every 6 months, due in September

## 2022-06-11 NOTE — Assessment & Plan Note (Addendum)
He had recent phlebotomy I plan to recheck ferritin level in October

## 2022-06-11 NOTE — Patient Instructions (Signed)
[facility]  Discharge Instructions: Thank you for choosing [facility] to provide your oncology and hematology care.   If you have a lab appointment with the Cancer Center, please go directly to the Cancer Center and check in at the registration area.   Wear comfortable clothing and clothing appropriate for easy access to any Portacath or PICC line.   We strive to give you quality time with your provider. You may need to reschedule your appointment if you arrive late (15 or more minutes).  Arriving late affects you and other patients whose appointments are after yours.  Also, if you miss three or more appointments without notifying the office, you may be dismissed from the clinic at the provider's discretion.      For prescription refill requests, have your pharmacy contact our office and allow 72 hours for refills to be completed.    Today you received the following chemotherapy and/or immunotherapy agents: Daratumumab      To help prevent nausea and vomiting after your treatment, we encourage you to take your nausea medication as directed.  BELOW ARE SYMPTOMS THAT SHOULD BE REPORTED IMMEDIATELY: *FEVER GREATER THAN 100.4 F (38 C) OR HIGHER *CHILLS OR SWEATING *NAUSEA AND VOMITING THAT IS NOT CONTROLLED WITH YOUR NAUSEA MEDICATION *UNUSUAL SHORTNESS OF BREATH *UNUSUAL BRUISING OR BLEEDING *URINARY PROBLEMS (pain or burning when urinating, or frequent urination) *BOWEL PROBLEMS (unusual diarrhea, constipation, pain near the anus) TENDERNESS IN MOUTH AND THROAT WITH OR WITHOUT PRESENCE OF ULCERS (sore throat, sores in mouth, or a toothache) UNUSUAL RASH, SWELLING OR PAIN  UNUSUAL VAGINAL DISCHARGE OR ITCHING   Items with * indicate a potential emergency and should be followed up as soon as possible or go to the Emergency Department if any problems should occur.  Please show the CHEMOTHERAPY ALERT CARD or IMMUNOTHERAPY ALERT CARD at check-in  to the Emergency Department and triage nurse.  Should you have questions after your visit or need to cancel or reschedule your appointment, please contact [facility]  Dept: [phone]  and follow the prompts.  Office hours are 8:00 a.m. to 4:30 p.m. Monday - Friday. Please note that voicemails left after 4:00 p.m. may not be returned until the following business day.  We are closed weekends and major holidays. You have access to a nurse at all times for urgent questions. Please call the main number to the clinic Dept: [phone] and follow the prompts.   For any non-urgent questions, you may also contact your provider using MyChart. We now offer e-Visits for anyone 18 and older to request care online for non-urgent symptoms. For details visit [email or web address].   Also download the MyChart app! Go to the app store, search "MyChart", open the app, select [facility], and log in with your MyChart username and password.  Masks are optional in the cancer centers. If you would like for your care team to wear a mask while they are taking care of you, please let them know. You may have one support person who is at least 71 years old accompany you for your appointments. 

## 2022-06-11 NOTE — Assessment & Plan Note (Signed)
He has persistent mild back pain He will continue the same prescribed pain medicine We discussed importance of weight loss

## 2022-06-11 NOTE — Progress Notes (Signed)
Per Alvy Bimler MD, ok to proceed with Zometa treatment today with CMP drawn 8/7.

## 2022-06-11 NOTE — Progress Notes (Signed)
[facility] PROGRESS NOTE  Patient Care Team: Denita Lung, MD as PCP - General (Family Medicine) Heath Lark, MD as Consulting Physician (Hematology and Oncology)  ASSESSMENT & PLAN:  Multiple myeloma without remission [facility]) His recent myeloma panel show stability We will proceed with treatment as scheduled He will be continue maintenance daratumumab only Zometa will be every 6 months, due in September  Hemochromatosis, hereditary He had recent phlebotomy I plan to recheck ferritin level in October  Chronic musculoskeletal pain He has persistent mild back pain He will continue the same prescribed pain medicine We discussed importance of weight loss  Orders Placed This Encounter  Procedures   Multiple Myeloma Panel (SPEP&IFE w/QIG)    Standing Status:   Future    Standing Expiration Date:   07/10/2023   Kappa/lambda light chains    Standing Status:   Future    Standing Expiration Date:   07/10/2023   CMP ([place] only)    Standing Status:   Future    Standing Expiration Date:   07/10/2023   Multiple Myeloma Panel (SPEP&IFE w/QIG)    Standing Status:   Future    Standing Expiration Date:   08/07/2023   Kappa/lambda light chains    Standing Status:   Future    Standing Expiration Date:   08/07/2023   CMP ([place] only)    Standing Status:   Future    Standing Expiration Date:   08/07/2023   Ferritin    Standing Status:   Future    Standing Expiration Date:   07/09/2023    All questions were answered. The patient knows to call the clinic with any problems, questions or concerns. The total time spent in the appointment was 30 minutes encounter with patients including review of chart and various tests results, discussions about plan of care and coordination of care plan   Heath Lark, MD 06/11/2022 4:19 PM  INTERVAL HISTORY: Please see below for problem oriented charting. he returns for treatment follow-up He has recent cough and was seen  by PCP Viral screen is negative He was prescribed antibiotics Denies fever No recent infusion reactions He has chronic back pain and continues to have back pain  REVIEW OF SYSTEMS:   Constitutional: Denies fevers, chills or abnormal weight loss Eyes: Denies blurriness of vision Ears, nose, mouth, throat, and face: Denies mucositis or sore throat Respiratory: Denies cough, dyspnea or wheezes Cardiovascular: Denies palpitation, chest discomfort or lower extremity swelling Gastrointestinal:  Denies nausea, heartburn or change in bowel habits Skin: Denies abnormal skin rashes Lymphatics: Denies new lymphadenopathy or easy bruising Neurological:Denies numbness, tingling or new weaknesses Behavioral/Psych: Mood is stable, no new changes  All other systems were reviewed with the patient and are negative.  I have reviewed the past medical history, past surgical history, social history and family history with the patient and they are unchanged from previous note.  ALLERGIES:  has No Known Allergies.  MEDICATIONS:  Current Outpatient Medications  Medication Sig Dispense Refill   acetaminophen (TYLENOL) 650 MG CR tablet Take 650 mg by mouth every 8 (eight) hours as needed for pain.     acyclovir (ZOVIRAX) 400 MG tablet Take 1 tablet (400 mg total) by mouth daily. 90 tablet 1   albuterol (VENTOLIN HFA) 108 (90 Base) MCG/ACT inhaler Inhale 2 puffs into the lungs every 6 (six) hours as needed for wheezing or shortness of breath. 18 g 0   atorvastatin (LIPITOR) 20 MG tablet Take 1  tablet (20 mg total) by mouth daily. 90 tablet 3   azithromycin (ZITHROMAX) 500 MG tablet Take 1 tablet (500 mg total) by mouth daily. 3 tablet 0   calcium carbonate (TUMS - DOSED IN MG ELEMENTAL CALCIUM) 500 MG chewable tablet Chew 1-2 tablets by mouth daily as needed for heartburn. (Patient not taking: Reported on 04/22/2022)     Cholecalciferol (VITAMIN D) 50 MCG (2000 UT) tablet Take 2,000 Units by mouth daily.      folic acid (FOLVITE) 448 MCG tablet Take 400 mcg by mouth daily.     losartan-hydrochlorothiazide (HYZAAR) 100-12.5 MG tablet Take 1 tablet by mouth daily. 90 tablet 3   Multiple Vitamin (MULTI-VITAMIN) tablet Take 1 tablet by mouth daily.     ondansetron (ZOFRAN) 8 MG tablet Take 1 tablet (8 mg total) by mouth 2 (two) times daily as needed (Nausea or vomiting). (Patient not taking: Reported on 04/22/2022) 30 tablet 1   oxyCODONE (ROXICODONE) 5 MG immediate release tablet Take 1 tablet (5 mg total) by mouth every 6 (six) hours as needed for severe pain. 90 tablet 0   Polyethyl Glycol-Propyl Glycol (SYSTANE OP) Place 1 drop into both eyes daily as needed (dry eyes).     prochlorperazine (COMPAZINE) 10 MG tablet Take 1 tablet (10 mg total) by mouth every 6 (six) hours as needed (Nausea or vomiting). (Patient not taking: Reported on 06/10/2022) 30 tablet 1   vitamin B-12 (CYANOCOBALAMIN) 1000 MCG tablet Take 1,000 mcg by mouth daily.     XARELTO 20 MG TABS tablet TAKE 1 TABLET(20 MG) BY MOUTH DAILY WITH SUPPER 90 tablet 1   No current facility-administered medications for this visit.    SUMMARY OF ONCOLOGIC HISTORY: Oncology History  Multiple myeloma without remission ([place])  03/20/2019 Imaging   1. Tumor involving the L5 and S1 and S2 segments of the spine as described with mass effects upon the left L4, L5, S1 and more distal left sacral nerves as described above. 2. Does the patient have a history of malignancy? 3. Multiple plasmacytomas and metastatic disease could give this appearance   03/27/2019 Pathology Results   Soft Tissue Needle Core Biopsy, sacral mass - PLASMABLASTIC NEOPLASM. - SEE COMMENT. Microscopic Comment The sections show needle core biopsy fragments of soft tissue densely infiltrated by a relatively monomorphic infiltrate of atypical plasmacytoid cells characterized by vesicular or partially clumped chromatin and prominent nucleoli. This is associated with scattered mitosis. A  battery of immunohistochemical stains was performed and show that the atypical plasmacytoid cells are positive for CD138, CD43 and cytoplasmic kappa. There is also weak positivity for CD56 and partial variable positivity for cyclin D1. The atypical plasmacytoid cells are negative for cytoplasmic lambda, CD10, PAX5, CD79a, CD20, CD3, CD5, CD34, EBV (ISH) and mostly negative for LCA. The overall morphologic and histologic features are most compatible with a plasmablastic neoplasm. The differential diagnosis includes plasmablastic lymphoma and plasmablastic plasmacytoma/myeloma. Based on the overall phenotypic features and the clinical setting, plasmacytoma/myeloma is favored. Clinical correlation and hematologic evaluation is recommended   03/27/2019 Procedure   Technically successful CT-guided biopsy of sacral soft tissue mass.   04/04/2019 Initial Diagnosis   Multiple myeloma without remission ([place])   04/11/2019 PET scan   IMPRESSION: 1. Previously noted expansile lesions involving the L5 vertebra and sacrum are again noted and exhibit intense FDG uptake compatible with metabolically active disease. A third focus of increased uptake without corresponding CT abnormality is noted within the intertrochanteric portions of the proximal right femur. 2.  Small lucent lesion within the T3 vertebra is noted without corresponding FDG uptake. 3. Asymmetric left bladder wall thickening, etiology indeterminate. 4. Aortic Atherosclerosis (ICD10-I70.0). Lad coronary artery calcification.   04/12/2019 Cancer Staging   Staging form: Plasma Cell Myeloma and Plasma Cell Disorders, AJCC 8th Edition - Clinical stage from 04/12/2019: Beta-2-microglobulin (mg/L): 2, Albumin (g/dL): 3.6, ISS: Stage I, High-risk cytogenetics: Unknown, LDH: Unknown - Signed by Heath Lark, MD on 04/12/2019   04/14/2019 Bone Marrow Biopsy   Bone Marrow, Aspirate,Biopsy, and Clot, right iliac bone - PLASMA CELL MYELOMA.   04/17/2019 - 07/11/2019  Chemotherapy   The patient had bortezemib, revlimid and dexamethasone for chemotherapy treatment.     08/17/2019 Bone Marrow Transplant   He received high dose melphalan followed by autologous stem cell [facility]   11/22/2019 Bone Marrow Biopsy   BONE MARROW biopsy at [facility]:       Mild monoclonal plasmacytosis (approximately 2%), kappa light chain restricted.    11/22/2019 PET scan   PET CT at [facility] 1.  Similar size and appearance of mildly hypermetabolic lytic lesion in the L5 vertebral body and posterior elements.  2.  Otherwise, no substantial FDG uptake compared to background bone marrow in the additional osseous lucent lesions.    12/20/2019 - 05/09/2020 Radiation Therapy   Radiation Treatment Dates: 12/20/2019 through 01/08/2020 Site Technique Total Dose (Gy) Dose per Fx (Gy) Completed Fx Beam Energies  Lumbar Spine: Spine 3D 35/35 2.5 14/14 15X        02/29/2020 PET scan   1. Similar size and appearance of expansile lytic lesion in the L5 vertebral body and posterior elements with minimal FDG activity.  2. Otherwise, no additional osseous lucent lesions with FDG uptake above background bone marrow   05/31/2020 -  Chemotherapy   The patient had Revlimid for chemotherapy treatment.     11/25/2021 - 05/11/2022 Chemotherapy   Patient is on Treatment Plan : MYELOMA RELAPSED / REFRACTORY Daratumumab SQ + Bortezomib + Dexamethasone (DaraVd) q21d / Daratumumab SQ q28d      11/25/2021 -  Chemotherapy   Patient is on Treatment Plan : MYELOMA RELAPSED / REFRACTORY Daratumumab SQ + Bortezomib + Dexamethasone (DaraVd) q21d / Daratumumab SQ q28d        PHYSICAL EXAMINATION: ECOG PERFORMANCE STATUS: 1 - Symptomatic but completely ambulatory  Vitals:   06/11/22 1151  BP: 120/88  Pulse: 92  Resp: 18  Temp: 97.9 F (36.6 C)  SpO2: 97%   Filed Weights   06/11/22 1151  Weight: 238 lb 12.8 oz (108.3 kg)    GENERAL:alert, no distress and comfortable NEURO:  alert & oriented x 3 with fluent speech, no focal motor/sensory deficits  LABORATORY DATA:  I have reviewed the data as listed    Component Value Date/Time   NA 138 05/11/2022 1055   NA 139 10/11/2018 1115   NA 139 04/05/2014 0849   K 4.1 05/11/2022 1055   K 4.4 04/05/2014 0849   CL 104 05/11/2022 1055   CO2 29 05/11/2022 1055   CO2 23 04/05/2014 0849   GLUCOSE 95 05/11/2022 1055   GLUCOSE 109 04/05/2014 0849   BUN 23 05/11/2022 1055   BUN 23 10/11/2018 1115   BUN 16.8 04/05/2014 0849   CREATININE 0.77 05/11/2022 1055   CREATININE 0.91 07/29/2017 1004   CREATININE 0.9 04/05/2014 0849   CALCIUM 9.0 05/11/2022 1055   CALCIUM 8.7 04/05/2014 0849   PROT 6.5 05/11/2022 1055   PROT 7.2 10/11/2018  1115   PROT 6.7 04/05/2014 0849   ALBUMIN 4.2 05/11/2022 1055   ALBUMIN 4.7 10/11/2018 1115   ALBUMIN 3.8 04/05/2014 0849   AST 22 05/11/2022 1055   AST 26 04/05/2014 0849   ALT 30 05/11/2022 1055   ALT 30 04/05/2014 0849   ALKPHOS 55 05/11/2022 1055   ALKPHOS 75 04/05/2014 0849   BILITOT 0.8 05/11/2022 1055   BILITOT 0.71 04/05/2014 0849   GFRNONAA >60 05/11/2022 1055   GFRAA >60 06/14/2020 0830    No results found for: "SPEP", "UPEP"  Lab Results  Component Value Date   WBC 5.9 06/11/2022   NEUTROABS 2.3 06/11/2022   HGB 14.8 06/11/2022   HCT 43.0 06/11/2022   MCV 102.9 (H) 06/11/2022   PLT 175 06/11/2022      Chemistry      Component Value Date/Time   NA 138 05/11/2022 1055   NA 139 10/11/2018 1115   NA 139 04/05/2014 0849   K 4.1 05/11/2022 1055   K 4.4 04/05/2014 0849   CL 104 05/11/2022 1055   CO2 29 05/11/2022 1055   CO2 23 04/05/2014 0849   BUN 23 05/11/2022 1055   BUN 23 10/11/2018 1115   BUN 16.8 04/05/2014 0849   CREATININE 0.77 05/11/2022 1055   CREATININE 0.91 07/29/2017 1004   CREATININE 0.9 04/05/2014 0849   GLU 10 04/10/2014 0000      Component Value Date/Time   CALCIUM 9.0 05/11/2022 1055   CALCIUM 8.7 04/05/2014 0849   ALKPHOS 55  05/11/2022 1055   ALKPHOS 75 04/05/2014 0849   AST 22 05/11/2022 1055   AST 26 04/05/2014 0849   ALT 30 05/11/2022 1055   ALT 30 04/05/2014 0849   BILITOT 0.8 05/11/2022 1055   BILITOT 0.71 04/05/2014 0849

## 2022-06-12 LAB — KAPPA/LAMBDA LIGHT CHAINS
Kappa free light chain: 11 mg/L (ref 3.3–19.4)
Kappa, lambda light chain ratio: 1.43 (ref 0.26–1.65)
Lambda free light chains: 7.7 mg/L (ref 5.7–26.3)

## 2022-06-16 LAB — MULTIPLE MYELOMA PANEL, SERUM
Albumin SerPl Elph-Mcnc: 3.5 g/dL (ref 2.9–4.4)
Albumin/Glob SerPl: 1.5 (ref 0.7–1.7)
Alpha 1: 0.2 g/dL (ref 0.0–0.4)
Alpha2 Glob SerPl Elph-Mcnc: 0.9 g/dL (ref 0.4–1.0)
B-Globulin SerPl Elph-Mcnc: 0.8 g/dL (ref 0.7–1.3)
Gamma Glob SerPl Elph-Mcnc: 0.5 g/dL (ref 0.4–1.8)
Globulin, Total: 2.4 g/dL (ref 2.2–3.9)
IgA: 12 mg/dL — ABNORMAL LOW (ref 61–437)
IgG (Immunoglobin G), Serum: 546 mg/dL — ABNORMAL LOW (ref 603–1613)
IgM (Immunoglobulin M), Srm: 11 mg/dL — ABNORMAL LOW (ref 20–172)
M Protein SerPl Elph-Mcnc: 0.3 g/dL — ABNORMAL HIGH
Total Protein ELP: 5.9 g/dL — ABNORMAL LOW (ref 6.0–8.5)

## 2022-06-30 ENCOUNTER — Other Ambulatory Visit: Payer: Self-pay | Admitting: Family Medicine

## 2022-06-30 DIAGNOSIS — I10 Essential (primary) hypertension: Secondary | ICD-10-CM

## 2022-07-02 ENCOUNTER — Other Ambulatory Visit: Payer: Self-pay | Admitting: Family Medicine

## 2022-07-02 DIAGNOSIS — R062 Wheezing: Secondary | ICD-10-CM

## 2022-07-02 NOTE — Telephone Encounter (Signed)
[facility] is requesting to fill pt albuterol inhaler. Please advise [facility]

## 2022-07-09 ENCOUNTER — Inpatient Hospital Stay: Payer: Medicare Other

## 2022-07-09 ENCOUNTER — Encounter: Payer: Self-pay | Admitting: Hematology and Oncology

## 2022-07-09 ENCOUNTER — Inpatient Hospital Stay (HOSPITAL_BASED_OUTPATIENT_CLINIC_OR_DEPARTMENT_OTHER): Payer: Medicare Other | Admitting: Hematology and Oncology

## 2022-07-09 ENCOUNTER — Other Ambulatory Visit: Payer: Self-pay

## 2022-07-09 ENCOUNTER — Inpatient Hospital Stay: Payer: Medicare Other | Attending: Hematology and Oncology

## 2022-07-09 DIAGNOSIS — I2510 Atherosclerotic heart disease of native coronary artery without angina pectoris: Secondary | ICD-10-CM | POA: Diagnosis not present

## 2022-07-09 DIAGNOSIS — Z23 Encounter for immunization: Secondary | ICD-10-CM | POA: Diagnosis not present

## 2022-07-09 DIAGNOSIS — M7918 Myalgia, other site: Secondary | ICD-10-CM | POA: Insufficient documentation

## 2022-07-09 DIAGNOSIS — Z7901 Long term (current) use of anticoagulants: Secondary | ICD-10-CM | POA: Diagnosis not present

## 2022-07-09 DIAGNOSIS — G8929 Other chronic pain: Secondary | ICD-10-CM | POA: Diagnosis not present

## 2022-07-09 DIAGNOSIS — Z0000 Encounter for general adult medical examination without abnormal findings: Secondary | ICD-10-CM

## 2022-07-09 DIAGNOSIS — I700 Atherosclerosis of aorta: Secondary | ICD-10-CM | POA: Diagnosis not present

## 2022-07-09 DIAGNOSIS — Z5112 Encounter for antineoplastic immunotherapy: Secondary | ICD-10-CM | POA: Insufficient documentation

## 2022-07-09 DIAGNOSIS — C9000 Multiple myeloma not having achieved remission: Secondary | ICD-10-CM

## 2022-07-09 DIAGNOSIS — Z79899 Other long term (current) drug therapy: Secondary | ICD-10-CM | POA: Diagnosis not present

## 2022-07-09 DIAGNOSIS — M549 Dorsalgia, unspecified: Secondary | ICD-10-CM | POA: Diagnosis not present

## 2022-07-09 LAB — CMP (CANCER CENTER ONLY)
ALT: 26 U/L (ref 0–44)
AST: 22 U/L (ref 15–41)
Albumin: 4.1 g/dL (ref 3.5–5.0)
Alkaline Phosphatase: 50 U/L (ref 38–126)
Anion gap: 4 — ABNORMAL LOW (ref 5–15)
BUN: 21 mg/dL (ref 8–23)
CO2: 29 mmol/L (ref 22–32)
Calcium: 8.9 mg/dL (ref 8.9–10.3)
Chloride: 106 mmol/L (ref 98–111)
Creatinine: 0.8 mg/dL (ref 0.61–1.24)
GFR, Estimated: >60 mL/min (ref >60)
Glucose, Bld: 101 mg/dL — ABNORMAL HIGH (ref 70–99)
Potassium: 4.4 mmol/L (ref 3.5–5.1)
Sodium: 139 mmol/L (ref 135–145)
Total Bilirubin: 0.8 mg/dL (ref 0.3–1.2)
Total Protein: 6.5 g/dL (ref 6.5–8.1)

## 2022-07-09 LAB — CBC WITH DIFFERENTIAL (CANCER CENTER ONLY)
Abs Immature Granulocytes: 0.01 10*3/uL (ref 0.00–0.07)
Basophils Absolute: 0 10*3/uL (ref 0.0–0.1)
Basophils Relative: 0 %
Eosinophils Absolute: 0.1 10*3/uL (ref 0.0–0.5)
Eosinophils Relative: 2 %
HCT: 43 % (ref 39.0–52.0)
Hemoglobin: 15 g/dL (ref 13.0–17.0)
Immature Granulocytes: 0 %
Lymphocytes Relative: 40 %
Lymphs Abs: 2.1 10*3/uL (ref 0.7–4.0)
MCH: 35.8 pg — ABNORMAL HIGH (ref 26.0–34.0)
MCHC: 34.9 g/dL (ref 30.0–36.0)
MCV: 102.6 fL — ABNORMAL HIGH (ref 80.0–100.0)
Monocytes Absolute: 0.8 10*3/uL (ref 0.1–1.0)
Monocytes Relative: 15 %
Neutro Abs: 2.2 10*3/uL (ref 1.7–7.7)
Neutrophils Relative %: 43 %
Platelet Count: 168 10*3/uL (ref 150–400)
RBC: 4.19 MIL/uL — ABNORMAL LOW (ref 4.22–5.81)
RDW: 12.7 % (ref 11.5–15.5)
WBC Count: 5.2 10*3/uL (ref 4.0–10.5)
nRBC: 0 % (ref 0.0–0.2)

## 2022-07-09 LAB — FERRITIN: Ferritin: 205 ng/mL (ref 24–336)

## 2022-07-09 MED ORDER — OXYCODONE HCL 5 MG PO TABS: 5.0000 mg | ORAL_TABLET | Freq: Four times a day (QID) | ORAL | 0 refills | Status: DC | PRN

## 2022-07-09 MED ORDER — INFLUENZA VAC A&B SA ADJ QUAD 0.5 ML IM PRSY
0.5000 mL | PREFILLED_SYRINGE | Freq: Once | INTRAMUSCULAR | Status: AC
  Administered 2022-07-09: 0.5 mL via INTRAMUSCULAR
  Filled 2022-07-09: qty 0.5

## 2022-07-09 MED ORDER — DIPHENHYDRAMINE HCL 25 MG PO CAPS
25.0000 mg | ORAL_CAPSULE | Freq: Once | ORAL | Status: AC
  Administered 2022-07-09: 25 mg via ORAL
  Filled 2022-07-09: qty 1

## 2022-07-09 MED ORDER — ACETAMINOPHEN 325 MG PO TABS
650.0000 mg | ORAL_TABLET | Freq: Once | ORAL | Status: AC
  Administered 2022-07-09: 650 mg via ORAL
  Filled 2022-07-09: qty 2

## 2022-07-09 MED ORDER — DARATUMUMAB-HYALURONIDASE-FIHJ 1800-30000 MG-UT/15ML ~~LOC~~ SOLN
1800.0000 mg | Freq: Once | SUBCUTANEOUS | Status: AC
  Administered 2022-07-09: 1800 mg via SUBCUTANEOUS
  Filled 2022-07-09: qty 15

## 2022-07-09 NOTE — Patient Instructions (Signed)
[facility]  Discharge Instructions: Thank you for choosing [facility] to provide your oncology and hematology care.   If you have a lab appointment with the Cancer Center, please go directly to the Cancer Center and check in at the registration area.   Wear comfortable clothing and clothing appropriate for easy access to any Portacath or PICC line.   We strive to give you quality time with your provider. You may need to reschedule your appointment if you arrive late (15 or more minutes).  Arriving late affects you and other patients whose appointments are after yours.  Also, if you miss three or more appointments without notifying the office, you may be dismissed from the clinic at the provider's discretion.      For prescription refill requests, have your pharmacy contact our office and allow 72 hours for refills to be completed.    Today you received the following chemotherapy and/or immunotherapy agents: daratumumab- hyaluronidase      To help prevent nausea and vomiting after your treatment, we encourage you to take your nausea medication as directed.  BELOW ARE SYMPTOMS THAT SHOULD BE REPORTED IMMEDIATELY: *FEVER GREATER THAN 100.4 F (38 C) OR HIGHER *CHILLS OR SWEATING *NAUSEA AND VOMITING THAT IS NOT CONTROLLED WITH YOUR NAUSEA MEDICATION *UNUSUAL SHORTNESS OF BREATH *UNUSUAL BRUISING OR BLEEDING *URINARY PROBLEMS (pain or burning when urinating, or frequent urination) *BOWEL PROBLEMS (unusual diarrhea, constipation, pain near the anus) TENDERNESS IN MOUTH AND THROAT WITH OR WITHOUT PRESENCE OF ULCERS (sore throat, sores in mouth, or a toothache) UNUSUAL RASH, SWELLING OR PAIN  UNUSUAL VAGINAL DISCHARGE OR ITCHING   Items with * indicate a potential emergency and should be followed up as soon as possible or go to the Emergency Department if any problems should occur.  Please show the CHEMOTHERAPY ALERT CARD or IMMUNOTHERAPY ALERT  CARD at check-in to the Emergency Department and triage nurse.  Should you have questions after your visit or need to cancel or reschedule your appointment, please contact [facility]  Dept: [phone]  and follow the prompts.  Office hours are 8:00 a.m. to 4:30 p.m. Monday - Friday. Please note that voicemails left after 4:00 p.m. may not be returned until the following business day.  We are closed weekends and major holidays. You have access to a nurse at all times for urgent questions. Please call the main number to the clinic Dept: [phone] and follow the prompts.   For any non-urgent questions, you may also contact your provider using MyChart. We now offer e-Visits for anyone 18 and older to request care online for non-urgent symptoms. For details visit [email or web address].   Also download the MyChart app! Go to the app store, search "MyChart", open the app, select [facility], and log in with your MyChart username and password.  Masks are optional in the cancer centers. If you would like for your care team to wear a mask while they are taking care of you, please let them know. You may have one support person who is at least 71 years old accompany you for your appointments. 

## 2022-07-09 NOTE — Assessment & Plan Note (Signed)
He has persistent mild back pain He will continue the same prescribed pain medicine We discussed importance of weight loss

## 2022-07-09 NOTE — Progress Notes (Signed)
[place] OFFICE PROGRESS NOTE  Patient Care Team: Denita Lung, MD as PCP - General (Family Medicine) Heath Lark, MD as Consulting Physician (Hematology and Oncology)  ASSESSMENT & PLAN:  Multiple myeloma without remission [facility]) His recent myeloma panel show stability We will proceed with treatment as scheduled He will be continue maintenance daratumumab only Zometa will be every 6 months, due in March 2024  Chronic musculoskeletal pain He has persistent mild back pain He will continue the same prescribed pain medicine We discussed importance of weight loss  No orders of the defined types were placed in this encounter.   All questions were answered. The patient knows to call the clinic with any problems, questions or concerns. The total time spent in the appointment was 25 minutes encounter with patients including review of chart and various tests results, discussions about plan of care and coordination of care plan   Heath Lark, MD 07/09/2022 1:21 PM  INTERVAL HISTORY: Please see below for problem oriented charting. he returns for treatment follow-up on maintenance daratumumab for multiple myeloma He is doing well He is recovering well from his recent infection His chronic back pain is stable  REVIEW OF SYSTEMS:   Constitutional: Denies fevers, chills or abnormal weight loss Eyes: Denies blurriness of vision Ears, nose, mouth, throat, and face: Denies mucositis or sore throat Respiratory: Denies cough, dyspnea or wheezes Cardiovascular: Denies palpitation, chest discomfort or lower extremity swelling Gastrointestinal:  Denies nausea, heartburn or change in bowel habits Skin: Denies abnormal skin rashes Lymphatics: Denies new lymphadenopathy or easy bruising Neurological:Denies numbness, tingling or new weaknesses Behavioral/Psych: Mood is stable, no new changes  All other systems were reviewed with the patient and are negative.  I have reviewed the  past medical history, past surgical history, social history and family history with the patient and they are unchanged from previous note.  ALLERGIES:  has No Known Allergies.  MEDICATIONS:  Current Outpatient Medications  Medication Sig Dispense Refill   acetaminophen (TYLENOL) 650 MG CR tablet Take 650 mg by mouth every 8 (eight) hours as needed for pain.     acyclovir (ZOVIRAX) 400 MG tablet Take 1 tablet (400 mg total) by mouth daily. 90 tablet 1   atorvastatin (LIPITOR) 20 MG tablet Take 1 tablet (20 mg total) by mouth daily. 90 tablet 3   calcium carbonate (TUMS - DOSED IN MG ELEMENTAL CALCIUM) 500 MG chewable tablet Chew 1-2 tablets by mouth daily as needed for heartburn. (Patient not taking: Reported on 04/22/2022)     Cholecalciferol (VITAMIN D) 50 MCG (2000 UT) tablet Take 2,000 Units by mouth daily.     folic acid (FOLVITE) 768 MCG tablet Take 400 mcg by mouth daily.     losartan-hydrochlorothiazide (HYZAAR) 100-12.5 MG tablet TAKE 1 TABLET BY MOUTH DAILY 90 tablet 1   Multiple Vitamin (MULTI-VITAMIN) tablet Take 1 tablet by mouth daily.     ondansetron (ZOFRAN) 8 MG tablet Take 1 tablet (8 mg total) by mouth 2 (two) times daily as needed (Nausea or vomiting). (Patient not taking: Reported on 04/22/2022) 30 tablet 1   oxyCODONE (ROXICODONE) 5 MG immediate release tablet Take 1 tablet (5 mg total) by mouth every 6 (six) hours as needed for severe pain. 90 tablet 0   Polyethyl Glycol-Propyl Glycol (SYSTANE OP) Place 1 drop into both eyes daily as needed (dry eyes).     prochlorperazine (COMPAZINE) 10 MG tablet Take 1 tablet (10 mg total) by mouth every 6 (six) hours as  needed (Nausea or vomiting). (Patient not taking: Reported on 06/10/2022) 30 tablet 1   vitamin B-12 (CYANOCOBALAMIN) 1000 MCG tablet Take 1,000 mcg by mouth daily.     XARELTO 20 MG TABS tablet TAKE 1 TABLET(20 MG) BY MOUTH DAILY WITH SUPPER 90 tablet 1   No current facility-administered medications for this visit.    Facility-Administered Medications Ordered in Other Visits  Medication Dose Route Frequency Provider Last Rate Last Admin   daratumumab-hyaluronidase-fihj (DARZALEX FASPRO) 1800-30000 MG-UT/15ML chemo SQ injection 1,800 mg  1,800 mg Subcutaneous Once Heath Lark, MD        SUMMARY OF ONCOLOGIC HISTORY: Oncology History  Multiple myeloma without remission ([place])  03/20/2019 Imaging   1. Tumor involving the L5 and S1 and S2 segments of the spine as described with mass effects upon the left L4, L5, S1 and more distal left sacral nerves as described above. 2. Does the patient have a history of malignancy? 3. Multiple plasmacytomas and metastatic disease could give this appearance   03/27/2019 Pathology Results   Soft Tissue Needle Core Biopsy, sacral mass - PLASMABLASTIC NEOPLASM. - SEE COMMENT. Microscopic Comment The sections show needle core biopsy fragments of soft tissue densely infiltrated by a relatively monomorphic infiltrate of atypical plasmacytoid cells characterized by vesicular or partially clumped chromatin and prominent nucleoli. This is associated with scattered mitosis. A battery of immunohistochemical stains was performed and show that the atypical plasmacytoid cells are positive for CD138, CD43 and cytoplasmic kappa. There is also weak positivity for CD56 and partial variable positivity for cyclin D1. The atypical plasmacytoid cells are negative for cytoplasmic lambda, CD10, PAX5, CD79a, CD20, CD3, CD5, CD34, EBV (ISH) and mostly negative for LCA. The overall morphologic and histologic features are most compatible with a plasmablastic neoplasm. The differential diagnosis includes plasmablastic lymphoma and plasmablastic plasmacytoma/myeloma. Based on the overall phenotypic features and the clinical setting, plasmacytoma/myeloma is favored. Clinical correlation and hematologic evaluation is recommended   03/27/2019 Procedure   Technically successful CT-guided biopsy of sacral soft  tissue mass.   04/04/2019 Initial Diagnosis   Multiple myeloma without remission ([place])   04/11/2019 PET scan   IMPRESSION: 1. Previously noted expansile lesions involving the L5 vertebra and sacrum are again noted and exhibit intense FDG uptake compatible with metabolically active disease. A third focus of increased uptake without corresponding CT abnormality is noted within the intertrochanteric portions of the proximal right femur. 2. Small lucent lesion within the T3 vertebra is noted without corresponding FDG uptake. 3. Asymmetric left bladder wall thickening, etiology indeterminate. 4. Aortic Atherosclerosis (ICD10-I70.0). Lad coronary artery calcification.   04/12/2019 Cancer Staging   Staging form: Plasma Cell Myeloma and Plasma Cell Disorders, AJCC 8th Edition - Clinical stage from 04/12/2019: Beta-2-microglobulin (mg/L): 2, Albumin (g/dL): 3.6, ISS: Stage I, High-risk cytogenetics: Unknown, LDH: Unknown - Signed by Heath Lark, MD on 04/12/2019   04/14/2019 Bone Marrow Biopsy   Bone Marrow, Aspirate,Biopsy, and Clot, right iliac bone - PLASMA CELL MYELOMA.   04/17/2019 - 07/11/2019 Chemotherapy   The patient had bortezemib, revlimid and dexamethasone for chemotherapy treatment.     08/17/2019 Bone Marrow Transplant   He received high dose melphalan followed by autologous stem cell transplant at [facility]   11/22/2019 Bone Marrow Biopsy   BONE MARROW biopsy at [facility]:       Mild monoclonal plasmacytosis (approximately 2%), kappa light chain restricted.    11/22/2019 PET scan   PET CT at [facility] 1.  Similar size and appearance of  mildly hypermetabolic lytic lesion in the L5 vertebral body and posterior elements.  2.  Otherwise, no substantial FDG uptake compared to background bone marrow in the additional osseous lucent lesions.    12/20/2019 - 05/09/2020 Radiation Therapy   Radiation Treatment Dates: 12/20/2019 through 01/08/2020 Site Technique Total Dose (Gy) Dose per Fx (Gy)  Completed Fx Beam Energies  Lumbar Spine: Spine 3D 35/35 2.5 14/14 15X        02/29/2020 PET scan   1. Similar size and appearance of expansile lytic lesion in the L5 vertebral body and posterior elements with minimal FDG activity.  2. Otherwise, no additional osseous lucent lesions with FDG uptake above background bone marrow   05/31/2020 -  Chemotherapy   The patient had Revlimid for chemotherapy treatment.     11/25/2021 - 05/11/2022 Chemotherapy   Patient is on Treatment Plan : MYELOMA RELAPSED / REFRACTORY Daratumumab SQ + Bortezomib + Dexamethasone (DaraVd) q21d / Daratumumab SQ q28d      11/25/2021 -  Chemotherapy   Patient is on Treatment Plan : MYELOMA RELAPSED / REFRACTORY Daratumumab SQ + Bortezomib + Dexamethasone (DaraVd) q21d / Daratumumab SQ q28d        PHYSICAL EXAMINATION: ECOG PERFORMANCE STATUS: 1 - Symptomatic but completely ambulatory  Vitals:   07/09/22 1135  BP: 110/77  Pulse: 75  Resp: 18  Temp: 97.7 F (36.5 C)  SpO2: 97%   Filed Weights   07/09/22 1135  Weight: 239 lb (108.4 kg)    GENERAL:alert, no distress and comfortable NEURO: alert & oriented x 3 with fluent speech, no focal motor/sensory deficits  LABORATORY DATA:  I have reviewed the data as listed    Component Value Date/Time   NA 139 07/09/2022 1118   NA 139 10/11/2018 1115   NA 139 04/05/2014 0849   K 4.4 07/09/2022 1118   K 4.4 04/05/2014 0849   CL 106 07/09/2022 1118   CO2 29 07/09/2022 1118   CO2 23 04/05/2014 0849   GLUCOSE 101 (H) 07/09/2022 1118   GLUCOSE 109 04/05/2014 0849   BUN 21 07/09/2022 1118   BUN 23 10/11/2018 1115   BUN 16.8 04/05/2014 0849   CREATININE 0.80 07/09/2022 1118   CREATININE 0.91 07/29/2017 1004   CREATININE 0.9 04/05/2014 0849   CALCIUM 8.9 07/09/2022 1118   CALCIUM 8.7 04/05/2014 0849   PROT 6.5 07/09/2022 1118   PROT 7.2 10/11/2018 1115   PROT 6.7 04/05/2014 0849   ALBUMIN 4.1 07/09/2022 1118   ALBUMIN 4.7 10/11/2018 1115   ALBUMIN 3.8  04/05/2014 0849   AST 22 07/09/2022 1118   AST 26 04/05/2014 0849   ALT 26 07/09/2022 1118   ALT 30 04/05/2014 0849   ALKPHOS 50 07/09/2022 1118   ALKPHOS 75 04/05/2014 0849   BILITOT 0.8 07/09/2022 1118   BILITOT 0.71 04/05/2014 0849   GFRNONAA >60 07/09/2022 1118   GFRAA >60 06/14/2020 0830    No results found for: "SPEP", "UPEP"  Lab Results  Component Value Date   WBC 5.2 07/09/2022   NEUTROABS 2.2 07/09/2022   HGB 15.0 07/09/2022   HCT 43.0 07/09/2022   MCV 102.6 (H) 07/09/2022   PLT 168 07/09/2022      Chemistry      Component Value Date/Time   NA 139 07/09/2022 1118   NA 139 10/11/2018 1115   NA 139 04/05/2014 0849   K 4.4 07/09/2022 1118   K 4.4 04/05/2014 0849   CL 106 07/09/2022 1118   CO2 29 07/09/2022  1118   CO2 23 04/05/2014 0849   BUN 21 07/09/2022 1118   BUN 23 10/11/2018 1115   BUN 16.8 04/05/2014 0849   CREATININE 0.80 07/09/2022 1118   CREATININE 0.91 07/29/2017 1004   CREATININE 0.9 04/05/2014 0849   GLU 10 04/10/2014 0000      Component Value Date/Time   CALCIUM 8.9 07/09/2022 1118   CALCIUM 8.7 04/05/2014 0849   ALKPHOS 50 07/09/2022 1118   ALKPHOS 75 04/05/2014 0849   AST 22 07/09/2022 1118   AST 26 04/05/2014 0849   ALT 26 07/09/2022 1118   ALT 30 04/05/2014 0849   BILITOT 0.8 07/09/2022 1118   BILITOT 0.71 04/05/2014 0849

## 2022-07-09 NOTE — Assessment & Plan Note (Signed)
His recent myeloma panel show stability We will proceed with treatment as scheduled He will be continue maintenance daratumumab only Zometa will be every 6 months, due in March 2024 

## 2022-07-10 LAB — KAPPA/LAMBDA LIGHT CHAINS
Kappa free light chain: 12.7 mg/L (ref 3.3–19.4)
Kappa, lambda light chain ratio: 2.02 — ABNORMAL HIGH (ref 0.26–1.65)
Lambda free light chains: 6.3 mg/L (ref 5.7–26.3)

## 2022-07-14 ENCOUNTER — Encounter: Payer: Self-pay | Admitting: Internal Medicine

## 2022-07-15 ENCOUNTER — Other Ambulatory Visit: Payer: Self-pay

## 2022-07-15 LAB — MULTIPLE MYELOMA PANEL, SERUM
Albumin SerPl Elph-Mcnc: 3.8 g/dL (ref 2.9–4.4)
Albumin/Glob SerPl: 1.8 — ABNORMAL HIGH (ref 0.7–1.7)
Alpha 1: 0.2 g/dL (ref 0.0–0.4)
Alpha2 Glob SerPl Elph-Mcnc: 0.8 g/dL (ref 0.4–1.0)
B-Globulin SerPl Elph-Mcnc: 0.8 g/dL (ref 0.7–1.3)
Gamma Glob SerPl Elph-Mcnc: 0.5 g/dL (ref 0.4–1.8)
Globulin, Total: 2.2 g/dL (ref 2.2–3.9)
IgA: 18 mg/dL — ABNORMAL LOW (ref 61–437)
IgG (Immunoglobin G), Serum: 539 mg/dL — ABNORMAL LOW (ref 603–1613)
IgM (Immunoglobulin M), Srm: 13 mg/dL — ABNORMAL LOW (ref 20–172)
M Protein SerPl Elph-Mcnc: 0.2 g/dL — ABNORMAL HIGH
Total Protein ELP: 6 g/dL (ref 6.0–8.5)

## 2022-07-16 DIAGNOSIS — M5416 Radiculopathy, lumbar region: Secondary | ICD-10-CM | POA: Diagnosis not present

## 2022-07-16 DIAGNOSIS — M25551 Pain in right hip: Secondary | ICD-10-CM | POA: Diagnosis not present

## 2022-07-17 ENCOUNTER — Telehealth: Payer: Self-pay

## 2022-07-17 DIAGNOSIS — M545 Low back pain, unspecified: Secondary | ICD-10-CM | POA: Diagnosis not present

## 2022-07-17 NOTE — Telephone Encounter (Signed)
Called and left below message. Ask him to call with questions.

## 2022-07-17 NOTE — Telephone Encounter (Signed)
-----   Message from Heath Lark, MD sent at 07/16/2022  8:14 AM EDT ----- Pls let him know MM panel is stable

## 2022-07-27 ENCOUNTER — Encounter: Payer: Self-pay | Admitting: Internal Medicine

## 2022-07-30 DIAGNOSIS — M545 Low back pain, unspecified: Secondary | ICD-10-CM | POA: Diagnosis not present

## 2022-08-06 ENCOUNTER — Inpatient Hospital Stay (HOSPITAL_BASED_OUTPATIENT_CLINIC_OR_DEPARTMENT_OTHER): Payer: Medicare Other | Admitting: Hematology and Oncology

## 2022-08-06 ENCOUNTER — Encounter: Payer: Self-pay | Admitting: Hematology and Oncology

## 2022-08-06 ENCOUNTER — Other Ambulatory Visit: Payer: Self-pay

## 2022-08-06 ENCOUNTER — Inpatient Hospital Stay: Payer: Medicare Other

## 2022-08-06 ENCOUNTER — Inpatient Hospital Stay: Payer: Medicare Other | Attending: Hematology and Oncology

## 2022-08-06 VITALS — BP 124/88 | HR 96 | Temp 99.2°F | Resp 18 | Ht 68.5 in | Wt 243.6 lb

## 2022-08-06 DIAGNOSIS — C9000 Multiple myeloma not having achieved remission: Secondary | ICD-10-CM | POA: Insufficient documentation

## 2022-08-06 DIAGNOSIS — Z79624 Long term (current) use of inhibitors of nucleotide synthesis: Secondary | ICD-10-CM | POA: Diagnosis not present

## 2022-08-06 DIAGNOSIS — G8929 Other chronic pain: Secondary | ICD-10-CM

## 2022-08-06 DIAGNOSIS — M7918 Myalgia, other site: Secondary | ICD-10-CM

## 2022-08-06 DIAGNOSIS — Z9484 Stem cells transplant status: Secondary | ICD-10-CM | POA: Insufficient documentation

## 2022-08-06 DIAGNOSIS — I2510 Atherosclerotic heart disease of native coronary artery without angina pectoris: Secondary | ICD-10-CM | POA: Diagnosis not present

## 2022-08-06 DIAGNOSIS — I700 Atherosclerosis of aorta: Secondary | ICD-10-CM | POA: Insufficient documentation

## 2022-08-06 DIAGNOSIS — Z79899 Other long term (current) drug therapy: Secondary | ICD-10-CM | POA: Diagnosis not present

## 2022-08-06 DIAGNOSIS — Z7901 Long term (current) use of anticoagulants: Secondary | ICD-10-CM | POA: Diagnosis not present

## 2022-08-06 DIAGNOSIS — Z5112 Encounter for antineoplastic immunotherapy: Secondary | ICD-10-CM | POA: Diagnosis not present

## 2022-08-06 DIAGNOSIS — M549 Dorsalgia, unspecified: Secondary | ICD-10-CM | POA: Diagnosis not present

## 2022-08-06 LAB — CBC WITH DIFFERENTIAL (CANCER CENTER ONLY)
Abs Immature Granulocytes: 0 10*3/uL (ref 0.00–0.07)
Basophils Absolute: 0 10*3/uL (ref 0.0–0.1)
Basophils Relative: 0 %
Eosinophils Absolute: 0.1 10*3/uL (ref 0.0–0.5)
Eosinophils Relative: 2 %
HCT: 43.2 % (ref 39.0–52.0)
Hemoglobin: 14.8 g/dL (ref 13.0–17.0)
Immature Granulocytes: 0 %
Lymphocytes Relative: 38 %
Lymphs Abs: 2.1 10*3/uL (ref 0.7–4.0)
MCH: 35.4 pg — ABNORMAL HIGH (ref 26.0–34.0)
MCHC: 34.3 g/dL (ref 30.0–36.0)
MCV: 103.3 fL — ABNORMAL HIGH (ref 80.0–100.0)
Monocytes Absolute: 0.7 10*3/uL (ref 0.1–1.0)
Monocytes Relative: 12 %
Neutro Abs: 2.7 10*3/uL (ref 1.7–7.7)
Neutrophils Relative %: 48 %
Platelet Count: 160 10*3/uL (ref 150–400)
RBC: 4.18 MIL/uL — ABNORMAL LOW (ref 4.22–5.81)
RDW: 12.8 % (ref 11.5–15.5)
WBC Count: 5.5 10*3/uL (ref 4.0–10.5)
nRBC: 0 % (ref 0.0–0.2)

## 2022-08-06 LAB — CMP (CANCER CENTER ONLY)
ALT: 31 U/L (ref 0–44)
AST: 23 U/L (ref 15–41)
Albumin: 4 g/dL (ref 3.5–5.0)
Alkaline Phosphatase: 44 U/L (ref 38–126)
Anion gap: 5 (ref 5–15)
BUN: 21 mg/dL (ref 8–23)
CO2: 28 mmol/L (ref 22–32)
Calcium: 8.7 mg/dL — ABNORMAL LOW (ref 8.9–10.3)
Chloride: 104 mmol/L (ref 98–111)
Creatinine: 0.91 mg/dL (ref 0.61–1.24)
GFR, Estimated: >60 mL/min (ref >60)
Glucose, Bld: 119 mg/dL — ABNORMAL HIGH (ref 70–99)
Potassium: 4.2 mmol/L (ref 3.5–5.1)
Sodium: 137 mmol/L (ref 135–145)
Total Bilirubin: 1 mg/dL (ref 0.3–1.2)
Total Protein: 6.2 g/dL — ABNORMAL LOW (ref 6.5–8.1)

## 2022-08-06 MED ORDER — DIPHENHYDRAMINE HCL 25 MG PO CAPS
25.0000 mg | ORAL_CAPSULE | Freq: Once | ORAL | Status: AC
  Administered 2022-08-06: 25 mg via ORAL
  Filled 2022-08-06: qty 1

## 2022-08-06 MED ORDER — PROCHLORPERAZINE MALEATE 10 MG PO TABS: 10.0000 mg | ORAL_TABLET | Freq: Four times a day (QID) | ORAL | 1 refills | Status: DC | PRN

## 2022-08-06 MED ORDER — ACETAMINOPHEN 325 MG PO TABS
650.0000 mg | ORAL_TABLET | Freq: Once | ORAL | Status: AC
  Administered 2022-08-06: 650 mg via ORAL
  Filled 2022-08-06: qty 2

## 2022-08-06 MED ORDER — DARATUMUMAB-HYALURONIDASE-FIHJ 1800-30000 MG-UT/15ML ~~LOC~~ SOLN
1800.0000 mg | Freq: Once | SUBCUTANEOUS | Status: AC
  Administered 2022-08-06: 1800 mg via SUBCUTANEOUS
  Filled 2022-08-06: qty 15

## 2022-08-06 NOTE — Assessment & Plan Note (Signed)
He has persistent mild back pain He will continue the same prescribed pain medicine We discussed importance of weight loss He has appointment scheduled to be assessed and treated by orthopedic surgeon

## 2022-08-06 NOTE — Progress Notes (Signed)
[place] OFFICE PROGRESS NOTE  Patient Care Team: Denita Lung, MD as PCP - General (Family Medicine) Heath Lark, MD as Consulting Physician (Hematology and Oncology)  ASSESSMENT & PLAN:  Multiple myeloma without remission [facility]) His recent myeloma panel show stability We will proceed with treatment as scheduled He will be continue maintenance daratumumab only Zometa will be every 6 months, due in March 2024  Chronic musculoskeletal pain He has persistent mild back pain He will continue the same prescribed pain medicine We discussed importance of weight loss He has appointment scheduled to be assessed and treated by orthopedic surgeon  Orders Placed This Encounter  Procedures   Multiple Myeloma Panel (SPEP&IFE w/QIG)    Standing Status:   Future    Standing Expiration Date:   09/04/2023   Kappa/lambda light chains    Standing Status:   Future    Standing Expiration Date:   09/04/2023   CMP ([place] only)    Standing Status:   Future    Standing Expiration Date:   09/04/2023   CBC with Differential ([place] Only)    Standing Status:   Future    Standing Expiration Date:   09/04/2023   Multiple Myeloma Panel (SPEP&IFE w/QIG)    Standing Status:   Future    Standing Expiration Date:   10/02/2023   Kappa/lambda light chains    Standing Status:   Future    Standing Expiration Date:   10/02/2023   CMP ([place] only)    Standing Status:   Future    Standing Expiration Date:   10/02/2023   CBC with Differential ([place] Only)    Standing Status:   Future    Standing Expiration Date:   10/02/2023   Multiple Myeloma Panel (SPEP&IFE w/QIG)    Standing Status:   Future    Standing Expiration Date:   10/30/2023   Kappa/lambda light chains    Standing Status:   Future    Standing Expiration Date:   10/30/2023   CMP ([place] only)    Standing Status:   Future    Standing Expiration Date:   10/30/2023   CBC with Differential  ([place] Only)    Standing Status:   Future    Standing Expiration Date:   10/30/2023    All questions were answered. The patient knows to call the clinic with any problems, questions or concerns. The total time spent in the appointment was 20 minutes encounter with patients including review of chart and various tests results, discussions about plan of care and coordination of care plan   Heath Lark, MD 08/06/2022 11:28 AM  INTERVAL HISTORY: Please see below for problem oriented charting. he returns for treatment follow-up on maintenance daratumumab for recurrent multiple myeloma He is doing well except for slight worsening back pain He has gained some weight since last time I saw him No recent bleeding complications from anticoagulation therapy  REVIEW OF SYSTEMS:   Constitutional: Denies fevers, chills or abnormal weight loss Eyes: Denies blurriness of vision Ears, nose, mouth, throat, and face: Denies mucositis or sore throat Respiratory: Denies cough, dyspnea or wheezes Cardiovascular: Denies palpitation, chest discomfort or lower extremity swelling Gastrointestinal:  Denies nausea, heartburn or change in bowel habits Skin: Denies abnormal skin rashes Lymphatics: Denies new lymphadenopathy or easy bruising Neurological:Denies numbness, tingling or new weaknesses Behavioral/Psych: Mood is stable, no new changes  All other systems were reviewed with the patient and are negative.  I have  reviewed the past medical history, past surgical history, social history and family history with the patient and they are unchanged from previous note.  ALLERGIES:  has No Known Allergies.  MEDICATIONS:  Current Outpatient Medications  Medication Sig Dispense Refill   acetaminophen (TYLENOL) 650 MG CR tablet Take 650 mg by mouth every 8 (eight) hours as needed for pain.     acyclovir (ZOVIRAX) 400 MG tablet Take 1 tablet (400 mg total) by mouth daily. 90 tablet 1   atorvastatin (LIPITOR)  20 MG tablet Take 1 tablet (20 mg total) by mouth daily. 90 tablet 3   calcium carbonate (TUMS - DOSED IN MG ELEMENTAL CALCIUM) 500 MG chewable tablet Chew 1-2 tablets by mouth daily as needed for heartburn. (Patient not taking: Reported on 04/22/2022)     Cholecalciferol (VITAMIN D) 50 MCG (2000 UT) tablet Take 2,000 Units by mouth daily.     folic acid (FOLVITE) 086 MCG tablet Take 400 mcg by mouth daily.     losartan-hydrochlorothiazide (HYZAAR) 100-12.5 MG tablet TAKE 1 TABLET BY MOUTH DAILY 90 tablet 1   Multiple Vitamin (MULTI-VITAMIN) tablet Take 1 tablet by mouth daily.     ondansetron (ZOFRAN) 8 MG tablet Take 1 tablet (8 mg total) by mouth 2 (two) times daily as needed (Nausea or vomiting). (Patient not taking: Reported on 04/22/2022) 30 tablet 1   oxyCODONE (ROXICODONE) 5 MG immediate release tablet Take 1 tablet (5 mg total) by mouth every 6 (six) hours as needed for severe pain. 90 tablet 0   Polyethyl Glycol-Propyl Glycol (SYSTANE OP) Place 1 drop into both eyes daily as needed (dry eyes).     prochlorperazine (COMPAZINE) 10 MG tablet Take 1 tablet (10 mg total) by mouth every 6 (six) hours as needed (Nausea or vomiting). 30 tablet 1   vitamin B-12 (CYANOCOBALAMIN) 1000 MCG tablet Take 1,000 mcg by mouth daily.     XARELTO 20 MG TABS tablet TAKE 1 TABLET(20 MG) BY MOUTH DAILY WITH SUPPER 90 tablet 1   No current facility-administered medications for this visit.    SUMMARY OF ONCOLOGIC HISTORY: Oncology History  Multiple myeloma without remission ([employer] [employer])  03/20/2019 Imaging   1. Tumor involving the L5 and S1 and S2 segments of the spine as described with mass effects upon the left L4, L5, S1 and more distal left sacral nerves as described above. 2. Does the patient have a history of malignancy? 3. Multiple plasmacytomas and metastatic disease could give this appearance   03/27/2019 Pathology Results   Soft Tissue Needle Core Biopsy, sacral mass - PLASMABLASTIC NEOPLASM. - SEE  COMMENT. Microscopic Comment The sections show needle core biopsy fragments of soft tissue densely infiltrated by a relatively monomorphic infiltrate of atypical plasmacytoid cells characterized by vesicular or partially clumped chromatin and prominent nucleoli. This is associated with scattered mitosis. A battery of immunohistochemical stains was performed and show that the atypical plasmacytoid cells are positive for CD138, CD43 and cytoplasmic kappa. There is also weak positivity for CD56 and partial variable positivity for cyclin D1. The atypical plasmacytoid cells are negative for cytoplasmic lambda, CD10, PAX5, CD79a, CD20, CD3, CD5, CD34, EBV (ISH) and mostly negative for LCA. The overall morphologic and histologic features are most compatible with a plasmablastic neoplasm. The differential diagnosis includes plasmablastic lymphoma and plasmablastic plasmacytoma/myeloma. Based on the overall phenotypic features and the clinical setting, plasmacytoma/myeloma is favored. Clinical correlation and hematologic evaluation is recommended   03/27/2019 Procedure   Technically successful CT-guided biopsy of sacral soft tissue  mass.   04/04/2019 Initial Diagnosis   Multiple myeloma without remission ([place])   04/11/2019 PET scan   IMPRESSION: 1. Previously noted expansile lesions involving the L5 vertebra and sacrum are again noted and exhibit intense FDG uptake compatible with metabolically active disease. A third focus of increased uptake without corresponding CT abnormality is noted within the intertrochanteric portions of the proximal right femur. 2. Small lucent lesion within the T3 vertebra is noted without corresponding FDG uptake. 3. Asymmetric left bladder wall thickening, etiology indeterminate. 4. Aortic Atherosclerosis (ICD10-I70.0). Lad coronary artery calcification.   04/12/2019 Cancer Staging   Staging form: Plasma Cell Myeloma and Plasma Cell Disorders, AJCC 8th Edition - Clinical stage from  04/12/2019: Beta-2-microglobulin (mg/L): 2, Albumin (g/dL): 3.6, ISS: Stage I, High-risk cytogenetics: Unknown, LDH: Unknown - Signed by Heath Lark, MD on 04/12/2019   04/14/2019 Bone Marrow Biopsy   Bone Marrow, Aspirate,Biopsy, and Clot, right iliac bone - PLASMA CELL MYELOMA.   04/17/2019 - 07/11/2019 Chemotherapy   The patient had bortezemib, revlimid and dexamethasone for chemotherapy treatment.     08/17/2019 Bone Marrow Transplant   He received high dose melphalan followed by autologous stem cell transplant at [facility]   11/22/2019 Bone Marrow Biopsy   BONE MARROW biopsy at [facility]:       Mild monoclonal plasmacytosis (approximately 2%), kappa light chain restricted.    11/22/2019 PET scan   PET CT at [facility] 1.  Similar size and appearance of mildly hypermetabolic lytic lesion in the L5 vertebral body and posterior elements.  2.  Otherwise, no substantial FDG uptake compared to background bone marrow in the additional osseous lucent lesions.    12/20/2019 - 05/09/2020 Radiation Therapy   Radiation Treatment Dates: 12/20/2019 through 01/08/2020 Site Technique Total Dose (Gy) Dose per Fx (Gy) Completed Fx Beam Energies  Lumbar Spine: Spine 3D 35/35 2.5 14/14 15X        02/29/2020 PET scan   1. Similar size and appearance of expansile lytic lesion in the L5 vertebral body and posterior elements with minimal FDG activity.  2. Otherwise, no additional osseous lucent lesions with FDG uptake above background bone marrow   05/31/2020 -  Chemotherapy   The patient had Revlimid for chemotherapy treatment.     11/25/2021 - 05/11/2022 Chemotherapy   Patient is on Treatment Plan : MYELOMA RELAPSED / REFRACTORY Daratumumab SQ + Bortezomib + Dexamethasone (DaraVd) q21d / Daratumumab SQ q28d      11/25/2021 -  Chemotherapy   Patient is on Treatment Plan : MYELOMA RELAPSED / REFRACTORY Daratumumab SQ + Bortezomib + Dexamethasone (DaraVd) q21d / Daratumumab SQ q28d        PHYSICAL  EXAMINATION: ECOG PERFORMANCE STATUS: 1 - Symptomatic but completely ambulatory  Vitals:   08/06/22 1026  BP: 124/88  Pulse: 96  Resp: 18  Temp: 99.2 F (37.3 C)  SpO2: 98%   Filed Weights   08/06/22 1026  Weight: 243 lb 9.6 oz (110.5 kg)    GENERAL:alert, no distress and comfortable  NEURO: alert & oriented x 3 with fluent speech, no focal motor/sensory deficits  LABORATORY DATA:  I have reviewed the data as listed    Component Value Date/Time   NA 137 08/06/2022 1009   NA 139 10/11/2018 1115   NA 139 04/05/2014 0849   K 4.2 08/06/2022 1009   K 4.4 04/05/2014 0849   CL 104 08/06/2022 1009   CO2 28 08/06/2022 1009   CO2 23 04/05/2014 0849   GLUCOSE  119 (H) 08/06/2022 1009   GLUCOSE 109 04/05/2014 0849   BUN 21 08/06/2022 1009   BUN 23 10/11/2018 1115   BUN 16.8 04/05/2014 0849   CREATININE 0.91 08/06/2022 1009   CREATININE 0.91 07/29/2017 1004   CREATININE 0.9 04/05/2014 0849   CALCIUM 8.7 (L) 08/06/2022 1009   CALCIUM 8.7 04/05/2014 0849   PROT 6.2 (L) 08/06/2022 1009   PROT 7.2 10/11/2018 1115   PROT 6.7 04/05/2014 0849   ALBUMIN 4.0 08/06/2022 1009   ALBUMIN 4.7 10/11/2018 1115   ALBUMIN 3.8 04/05/2014 0849   AST 23 08/06/2022 1009   AST 26 04/05/2014 0849   ALT 31 08/06/2022 1009   ALT 30 04/05/2014 0849   ALKPHOS 44 08/06/2022 1009   ALKPHOS 75 04/05/2014 0849   BILITOT 1.0 08/06/2022 1009   BILITOT 0.71 04/05/2014 0849   GFRNONAA >60 08/06/2022 1009   GFRAA >60 06/14/2020 0830    No results found for: "SPEP", "UPEP"  Lab Results  Component Value Date   WBC 5.5 08/06/2022   NEUTROABS 2.7 08/06/2022   HGB 14.8 08/06/2022   HCT 43.2 08/06/2022   MCV 103.3 (H) 08/06/2022   PLT 160 08/06/2022      Chemistry      Component Value Date/Time   NA 137 08/06/2022 1009   NA 139 10/11/2018 1115   NA 139 04/05/2014 0849   K 4.2 08/06/2022 1009   K 4.4 04/05/2014 0849   CL 104 08/06/2022 1009   CO2 28 08/06/2022 1009   CO2 23 04/05/2014 0849    BUN 21 08/06/2022 1009   BUN 23 10/11/2018 1115   BUN 16.8 04/05/2014 0849   CREATININE 0.91 08/06/2022 1009   CREATININE 0.91 07/29/2017 1004   CREATININE 0.9 04/05/2014 0849   GLU 10 04/10/2014 0000      Component Value Date/Time   CALCIUM 8.7 (L) 08/06/2022 1009   CALCIUM 8.7 04/05/2014 0849   ALKPHOS 44 08/06/2022 1009   ALKPHOS 75 04/05/2014 0849   AST 23 08/06/2022 1009   AST 26 04/05/2014 0849   ALT 31 08/06/2022 1009   ALT 30 04/05/2014 0849   BILITOT 1.0 08/06/2022 1009   BILITOT 0.71 04/05/2014 0849

## 2022-08-06 NOTE — Assessment & Plan Note (Signed)
His recent myeloma panel show stability We will proceed with treatment as scheduled He will be continue maintenance daratumumab only Zometa will be every 6 months, due in March 2024

## 2022-08-07 LAB — KAPPA/LAMBDA LIGHT CHAINS
Kappa free light chain: 14.8 mg/L (ref 3.3–19.4)
Kappa, lambda light chain ratio: 2.69 — ABNORMAL HIGH (ref 0.26–1.65)
Lambda free light chains: 5.5 mg/L — ABNORMAL LOW (ref 5.7–26.3)

## 2022-08-10 LAB — MULTIPLE MYELOMA PANEL, SERUM
Albumin SerPl Elph-Mcnc: 3.7 g/dL (ref 2.9–4.4)
Albumin/Glob SerPl: 1.8 — ABNORMAL HIGH (ref 0.7–1.7)
Alpha 1: 0.2 g/dL (ref 0.0–0.4)
Alpha2 Glob SerPl Elph-Mcnc: 0.7 g/dL (ref 0.4–1.0)
B-Globulin SerPl Elph-Mcnc: 0.7 g/dL (ref 0.7–1.3)
Gamma Glob SerPl Elph-Mcnc: 0.4 g/dL (ref 0.4–1.8)
Globulin, Total: 2.1 g/dL — ABNORMAL LOW (ref 2.2–3.9)
IgA: 17 mg/dL — ABNORMAL LOW (ref 61–437)
IgG (Immunoglobin G), Serum: 540 mg/dL — ABNORMAL LOW (ref 603–1613)
IgM (Immunoglobulin M), Srm: 12 mg/dL — ABNORMAL LOW (ref 20–172)
M Protein SerPl Elph-Mcnc: 0.3 g/dL — ABNORMAL HIGH
Total Protein ELP: 5.8 g/dL — ABNORMAL LOW (ref 6.0–8.5)

## 2022-08-11 ENCOUNTER — Telehealth: Payer: Self-pay

## 2022-08-11 DIAGNOSIS — Z23 Encounter for immunization: Secondary | ICD-10-CM | POA: Diagnosis not present

## 2022-08-11 NOTE — Telephone Encounter (Signed)
-----   Message from Heath Lark, MD sent at 08/11/2022  8:40 AM EST ----- Pls call him, myeloma panel is stable

## 2022-08-11 NOTE — Telephone Encounter (Signed)
Called and given below message. He verbalized understanding. 

## 2022-08-20 ENCOUNTER — Other Ambulatory Visit: Payer: Self-pay | Admitting: Hematology and Oncology

## 2022-08-20 DIAGNOSIS — M5416 Radiculopathy, lumbar region: Secondary | ICD-10-CM | POA: Diagnosis not present

## 2022-08-20 DIAGNOSIS — C9000 Multiple myeloma not having achieved remission: Secondary | ICD-10-CM

## 2022-09-04 ENCOUNTER — Inpatient Hospital Stay: Payer: Medicare Other | Attending: Hematology and Oncology

## 2022-09-04 ENCOUNTER — Inpatient Hospital Stay (HOSPITAL_BASED_OUTPATIENT_CLINIC_OR_DEPARTMENT_OTHER): Payer: Medicare Other | Admitting: Hematology and Oncology

## 2022-09-04 ENCOUNTER — Encounter: Payer: Self-pay | Admitting: Hematology and Oncology

## 2022-09-04 ENCOUNTER — Inpatient Hospital Stay: Payer: Medicare Other

## 2022-09-04 VITALS — BP 127/76 | HR 82 | Temp 97.8°F | Resp 18 | Ht 68.5 in | Wt 242.8 lb

## 2022-09-04 DIAGNOSIS — Z9221 Personal history of antineoplastic chemotherapy: Secondary | ICD-10-CM | POA: Insufficient documentation

## 2022-09-04 DIAGNOSIS — Z79899 Other long term (current) drug therapy: Secondary | ICD-10-CM | POA: Diagnosis not present

## 2022-09-04 DIAGNOSIS — C9000 Multiple myeloma not having achieved remission: Secondary | ICD-10-CM

## 2022-09-04 DIAGNOSIS — R634 Abnormal weight loss: Secondary | ICD-10-CM | POA: Diagnosis not present

## 2022-09-04 DIAGNOSIS — Z9481 Bone marrow transplant status: Secondary | ICD-10-CM | POA: Insufficient documentation

## 2022-09-04 DIAGNOSIS — Z79624 Long term (current) use of inhibitors of nucleotide synthesis: Secondary | ICD-10-CM | POA: Insufficient documentation

## 2022-09-04 DIAGNOSIS — Z86711 Personal history of pulmonary embolism: Secondary | ICD-10-CM | POA: Diagnosis not present

## 2022-09-04 DIAGNOSIS — Z5112 Encounter for antineoplastic immunotherapy: Secondary | ICD-10-CM | POA: Insufficient documentation

## 2022-09-04 DIAGNOSIS — E669 Obesity, unspecified: Secondary | ICD-10-CM | POA: Diagnosis not present

## 2022-09-04 DIAGNOSIS — Z923 Personal history of irradiation: Secondary | ICD-10-CM | POA: Insufficient documentation

## 2022-09-04 DIAGNOSIS — Z7901 Long term (current) use of anticoagulants: Secondary | ICD-10-CM | POA: Diagnosis not present

## 2022-09-04 LAB — CBC WITH DIFFERENTIAL (CANCER CENTER ONLY)
Abs Immature Granulocytes: 0.01 10*3/uL (ref 0.00–0.07)
Basophils Absolute: 0 10*3/uL (ref 0.0–0.1)
Basophils Relative: 0 %
Eosinophils Absolute: 0.1 10*3/uL (ref 0.0–0.5)
Eosinophils Relative: 1 %
HCT: 43.1 % (ref 39.0–52.0)
Hemoglobin: 15.1 g/dL (ref 13.0–17.0)
Immature Granulocytes: 0 %
Lymphocytes Relative: 34 %
Lymphs Abs: 2 10*3/uL (ref 0.7–4.0)
MCH: 35.4 pg — ABNORMAL HIGH (ref 26.0–34.0)
MCHC: 35 g/dL (ref 30.0–36.0)
MCV: 100.9 fL — ABNORMAL HIGH (ref 80.0–100.0)
Monocytes Absolute: 0.8 10*3/uL (ref 0.1–1.0)
Monocytes Relative: 14 %
Neutro Abs: 3 10*3/uL (ref 1.7–7.7)
Neutrophils Relative %: 51 %
Platelet Count: 160 10*3/uL (ref 150–400)
RBC: 4.27 MIL/uL (ref 4.22–5.81)
RDW: 12.7 % (ref 11.5–15.5)
WBC Count: 5.8 10*3/uL (ref 4.0–10.5)
nRBC: 0 % (ref 0.0–0.2)

## 2022-09-04 LAB — CMP (CANCER CENTER ONLY)
ALT: 31 U/L (ref 0–44)
AST: 21 U/L (ref 15–41)
Albumin: 4.2 g/dL (ref 3.5–5.0)
Alkaline Phosphatase: 55 U/L (ref 38–126)
Anion gap: 6 (ref 5–15)
BUN: 34 mg/dL — ABNORMAL HIGH (ref 8–23)
CO2: 27 mmol/L (ref 22–32)
Calcium: 9.5 mg/dL (ref 8.9–10.3)
Chloride: 106 mmol/L (ref 98–111)
Creatinine: 0.74 mg/dL (ref 0.61–1.24)
GFR, Estimated: >60 mL/min (ref >60)
Glucose, Bld: 94 mg/dL (ref 70–99)
Potassium: 3.9 mmol/L (ref 3.5–5.1)
Sodium: 139 mmol/L (ref 135–145)
Total Bilirubin: 1 mg/dL (ref 0.3–1.2)
Total Protein: 6.5 g/dL (ref 6.5–8.1)

## 2022-09-04 MED ORDER — DARATUMUMAB-HYALURONIDASE-FIHJ 1800-30000 MG-UT/15ML ~~LOC~~ SOLN
1800.0000 mg | Freq: Once | SUBCUTANEOUS | Status: AC
  Administered 2022-09-04: 1800 mg via SUBCUTANEOUS
  Filled 2022-09-04: qty 15

## 2022-09-04 MED ORDER — DIPHENHYDRAMINE HCL 25 MG PO CAPS
25.0000 mg | ORAL_CAPSULE | Freq: Once | ORAL | Status: AC
  Administered 2022-09-04: 25 mg via ORAL
  Filled 2022-09-04: qty 1

## 2022-09-04 MED ORDER — ACETAMINOPHEN 325 MG PO TABS
650.0000 mg | ORAL_TABLET | Freq: Once | ORAL | Status: AC
  Administered 2022-09-04: 650 mg via ORAL
  Filled 2022-09-04: qty 2

## 2022-09-04 NOTE — Assessment & Plan Note (Signed)
Patient prior PE was provoked He will complete a years worth of anticoagulation therapy this month I will discontinue the prescription of his medication list to avoid automatic refills The patient understood

## 2022-09-04 NOTE — Assessment & Plan Note (Signed)
His recent myeloma panel show stability We will proceed with treatment as scheduled He will be continue maintenance daratumumab only Zometa will be every 6 months, due in March 2024

## 2022-09-04 NOTE — Progress Notes (Signed)
[facility] OFF[facility] PROGRESS NOTE  Patient Care Team: Denita Lung, MD as PCP - General (Family Medicine) Heath Lark, MD as Consulting Physician (Hematology and Oncology)  ASSESSMENT & PLAN:  Multiple myeloma without remission [facility]) His recent myeloma panel show stability We will proceed with treatment as scheduled He will be continue maintenance daratumumab only Zometa will be every 6 months, due in March 2024  Obesity (BMI 30-39.9) The patient struggle with weight gain We discussed importance of dietary modification and lifestyle changes The patient is motivated to start the weight loss program after the holidays  History of pulmonary embolus (PE) Patient prior PE was provoked He will complete a years worth of anticoagulation therapy this month I will discontinue the prescription of his medication list to avoid automatic refills The patient understood  No orders of the defined types were placed in this encounter.   All questions were answered. The patient knows to call the clinic with any problems, questions or concerns. The total time spent in the appointment was 20 minutes encounter with patients including review of chart and various tests results, discussions about plan of care and coordination of care plan   Heath Lark, MD 09/04/2022 11:33 AM  INTERVAL HISTORY: Please see below for problem oriented charting. he returns for treatment follow-up on maintenance therapy for recurrent multiple myeloma He is doing well Denies recent back pain No recent infection No bleeding complications from anticoagulation therapy  REVIEW OF SYSTEMS:   Constitutional: Denies fevers, chills or abnormal weight loss Eyes: Denies blurriness of vision Ears, nose, mouth, throat, and face: Denies mucositis or sore throat Respiratory: Denies cough, dyspnea or wheezes Cardiovascular: Denies palpitation, chest discomfort or lower extremity swelling Gastrointestinal:  Denies  nausea, heartburn or change in bowel habits Skin: Denies abnormal skin rashes Lymphatics: Denies new lymphadenopathy or easy bruising Neurological:Denies numbness, tingling or new weaknesses Behavioral/Psych: Mood is stable, no new changes  All other systems were reviewed with the patient and are negative.  I have reviewed the past medical history, past surgical history, social history and family history with the patient and they are unchanged from previous note.  ALLERGIES:  has No Known Allergies.  MEDICATIONS:  Current Outpatient Medications  Medication Sig Dispense Refill   acetaminophen (TYLENOL) 650 MG CR tablet Take 650 mg by mouth every 8 (eight) hours as needed for pain.     acyclovir (ZOVIRAX) 400 MG tablet Take 1 tablet (400 mg total) by mouth daily. 90 tablet 1   atorvastatin (LIPITOR) 20 MG tablet Take 1 tablet (20 mg total) by mouth daily. 90 tablet 3   calcium carbonate (TUMS - DOSED IN MG ELEMENTAL CALCIUM) 500 MG chewable tablet Chew 1-2 tablets by mouth daily as needed for heartburn. (Patient not taking: Reported on 04/22/2022)     Cholecalciferol (VITAMIN D) 50 MCG (2000 UT) tablet Take 2,000 Units by mouth daily.     folic acid (FOLVITE) 423 MCG tablet Take 400 mcg by mouth daily.     losartan-hydrochlorothiazide (HYZAAR) 100-12.5 MG tablet TAKE 1 TABLET BY MOUTH DAILY 90 tablet 1   Multiple Vitamin (MULTI-VITAMIN) tablet Take 1 tablet by mouth daily.     ondansetron (ZOFRAN) 8 MG tablet Take 1 tablet (8 mg total) by mouth 2 (two) times daily as needed (Nausea or vomiting). (Patient not taking: Reported on 04/22/2022) 30 tablet 1   oxyCODONE (ROXICODONE) 5 MG immediate release tablet Take 1 tablet (5 mg total) by mouth every 6 (six) hours as needed  for severe pain. 90 tablet 0   Polyethyl Glycol-Propyl Glycol (SYSTANE OP) Place 1 drop into both eyes daily as needed (dry eyes).     prochlorperazine (COMPAZINE) 10 MG tablet TAKE 1 TABLET(10 MG) BY MOUTH EVERY 6 HOURS AS  NEEDED FOR NAUSEA OR VOMITING 30 tablet 1   vitamin B-12 (CYANOCOBALAMIN) 1000 MCG tablet Take 1,000 mcg by mouth daily.     No current facility-administered medications for this visit.   Facility-Administered Medications Ordered in Other Visits  Medication Dose Route Frequency Provider Last Rate Last Admin   daratumumab-hyaluronidase-fihj (DARZALEX FASPRO) 1800-30000 MG-UT/15ML chemo SQ injection 1,800 mg  1,800 mg Subcutaneous Once Heath Lark, MD        SUMMARY OF ONCOLOGIC HISTORY: Oncology History  Multiple myeloma without remission ([place])  03/20/2019 Imaging   1. Tumor involving the L5 and S1 and S2 segments of the spine as described with mass effects upon the left L4, L5, S1 and more distal left sacral nerves as described above. 2. Does the patient have a history of malignancy? 3. Multiple plasmacytomas and metastatic disease could give this appearance   03/27/2019 Pathology Results   Soft Tissue Needle Core Biopsy, sacral mass - PLASMABLASTIC NEOPLASM. - SEE COMMENT. Microscopic Comment The sections show needle core biopsy fragments of soft tissue densely infiltrated by a relatively monomorphic infiltrate of atypical plasmacytoid cells characterized by vesicular or partially clumped chromatin and prominent nucleoli. This is associated with scattered mitosis. A battery of immunohistochemical stains was performed and show that the atypical plasmacytoid cells are positive for CD138, CD43 and cytoplasmic kappa. There is also weak positivity for CD56 and partial variable positivity for cyclin D1. The atypical plasmacytoid cells are negative for cytoplasmic lambda, CD10, PAX5, CD79a, CD20, CD3, CD5, CD34, EBV (ISH) and mostly negative for LCA. The overall morphologic and histologic features are most compatible with a plasmablastic neoplasm. The differential diagnosis includes plasmablastic lymphoma and plasmablastic plasmacytoma/myeloma. Based on the overall phenotypic features and the clinical  setting, plasmacytoma/myeloma is favored. Clinical correlation and hematologic evaluation is recommended   03/27/2019 Procedure   Technically successful CT-guided biopsy of sacral soft tissue mass.   04/04/2019 Initial Diagnosis   Multiple myeloma without remission ([place])   04/11/2019 PET scan   IMPRESSION: 1. Previously noted expansile lesions involving the L5 vertebra and sacrum are again noted and exhibit intense FDG uptake compatible with metabolically active disease. A third focus of increased uptake without corresponding CT abnormality is noted within the intertrochanteric portions of the proximal right femur. 2. Small lucent lesion within the T3 vertebra is noted without corresponding FDG uptake. 3. Asymmetric left bladder wall thickening, etiology indeterminate. 4. Aortic Atherosclerosis (ICD10-I70.0). Lad coronary artery calcification.   04/12/2019 Cancer Staging   Staging form: Plasma Cell Myeloma and Plasma Cell Disorders, AJCC 8th Edition - Clinical stage from 04/12/2019: Beta-2-microglobulin (mg/L): 2, Albumin (g/dL): 3.6, ISS: Stage I, High-risk cytogenetics: Unknown, LDH: Unknown - Signed by Heath Lark, MD on 04/12/2019   04/14/2019 Bone Marrow Biopsy   Bone Marrow, Aspirate,Biopsy, and Clot, right iliac bone - PLASMA CELL MYELOMA.   04/17/2019 - 07/11/2019 Chemotherapy   The patient had bortezemib, revlimid and dexamethasone for chemotherapy treatment.     08/17/2019 Bone Marrow Transplant   He received high dose melphalan followed by autologous stem cell transplant at [facility]   11/22/2019 Bone Marrow Biopsy   BONE MARROW biopsy at [facility]:       Mild monoclonal plasmacytosis (approximately 2%), kappa light chain restricted.  11/22/2019 PET scan   PET CT at [facility] 1.  Similar size and appearance of mildly hypermetabolic lytic lesion in the L5 vertebral body and posterior elements.  2.  Otherwise, no substantial FDG uptake compared to background bone marrow in the  additional osseous lucent lesions.    12/20/2019 - 05/09/2020 Radiation Therapy   Radiation Treatment Dates: 12/20/2019 through 01/08/2020 Site Technique Total Dose (Gy) Dose per Fx (Gy) Completed Fx Beam Energies  Lumbar Spine: Spine 3D 35/35 2.5 14/14 15X        02/29/2020 PET scan   1. Similar size and appearance of expansile lytic lesion in the L5 vertebral body and posterior elements with minimal FDG activity.  2. Otherwise, no additional osseous lucent lesions with FDG uptake above background bone marrow   05/31/2020 -  Chemotherapy   The patient had Revlimid for chemotherapy treatment.     11/25/2021 - 05/11/2022 Chemotherapy   Patient is on Treatment Plan : MYELOMA RELAPSED / REFRACTORY Daratumumab SQ + Bortezomib + Dexamethasone (DaraVd) q21d / Daratumumab SQ q28d      11/25/2021 -  Chemotherapy   Patient is on Treatment Plan : MYELOMA RELAPSED / REFRACTORY Daratumumab SQ + Bortezomib + Dexamethasone (DaraVd) q21d / Daratumumab SQ q28d        PHYSICAL EXAMINATION: ECOG PERFORMANCE STATUS: 0 - Asymptomatic  Vitals:   09/04/22 1105  BP: 127/76  Pulse: 82  Resp: 18  Temp: 97.8 F (36.6 C)  SpO2: 100%   Filed Weights   09/04/22 1105  Weight: 242 lb 12.8 oz (110.1 kg)    GENERAL:alert, no distress and comfortable NEURO: alert & oriented x 3 with fluent speech, no focal motor/sensory deficits  LABORATORY DATA:  I have reviewed the data as listed    Component Value Date/Time   NA 139 09/04/2022 1034   NA 139 10/11/2018 1115   NA 139 04/05/2014 0849   K 3.9 09/04/2022 1034   K 4.4 04/05/2014 0849   CL 106 09/04/2022 1034   CO2 27 09/04/2022 1034   CO2 23 04/05/2014 0849   GLUCOSE 94 09/04/2022 1034   GLUCOSE 109 04/05/2014 0849   BUN 34 (H) 09/04/2022 1034   BUN 23 10/11/2018 1115   BUN 16.8 04/05/2014 0849   CREATININE 0.74 09/04/2022 1034   CREATININE 0.91 07/29/2017 1004   CREATININE 0.9 04/05/2014 0849   CALCIUM 9.5 09/04/2022 1034   CALCIUM 8.7 04/05/2014  0849   PROT 6.5 09/04/2022 1034   PROT 7.2 10/11/2018 1115   PROT 6.7 04/05/2014 0849   ALBUMIN 4.2 09/04/2022 1034   ALBUMIN 4.7 10/11/2018 1115   ALBUMIN 3.8 04/05/2014 0849   AST 21 09/04/2022 1034   AST 26 04/05/2014 0849   ALT 31 09/04/2022 1034   ALT 30 04/05/2014 0849   ALKPHOS 55 09/04/2022 1034   ALKPHOS 75 04/05/2014 0849   BILITOT 1.0 09/04/2022 1034   BILITOT 0.71 04/05/2014 0849   GFRNONAA >60 09/04/2022 1034   GFRAA >60 06/14/2020 0830    No results found for: "SPEP", "UPEP"  Lab Results  Component Value Date   WBC 5.8 09/04/2022   NEUTROABS 3.0 09/04/2022   HGB 15.1 09/04/2022   HCT 43.1 09/04/2022   MCV 100.9 (H) 09/04/2022   PLT 160 09/04/2022      Chemistry      Component Value Date/Time   NA 139 09/04/2022 1034   NA 139 10/11/2018 1115   NA 139 04/05/2014 0849   K 3.9 09/04/2022 1034  K 4.4 04/05/2014 0849   CL 106 09/04/2022 1034   CO2 27 09/04/2022 1034   CO2 23 04/05/2014 0849   BUN 34 (H) 09/04/2022 1034   BUN 23 10/11/2018 1115   BUN 16.8 04/05/2014 0849   CREATININE 0.74 09/04/2022 1034   CREATININE 0.91 07/29/2017 1004   CREATININE 0.9 04/05/2014 0849   GLU 10 04/10/2014 0000      Component Value Date/Time   CALCIUM 9.5 09/04/2022 1034   CALCIUM 8.7 04/05/2014 0849   ALKPHOS 55 09/04/2022 1034   ALKPHOS 75 04/05/2014 0849   AST 21 09/04/2022 1034   AST 26 04/05/2014 0849   ALT 31 09/04/2022 1034   ALT 30 04/05/2014 0849   BILITOT 1.0 09/04/2022 1034   BILITOT 0.71 04/05/2014 0849

## 2022-09-04 NOTE — Assessment & Plan Note (Signed)
The patient struggle with weight gain We discussed importance of dietary modification and lifestyle changes The patient is motivated to start the weight loss program after the holidays

## 2022-09-07 LAB — KAPPA/LAMBDA LIGHT CHAINS
Kappa free light chain: 14.4 mg/L (ref 3.3–19.4)
Kappa, lambda light chain ratio: 2.4 — ABNORMAL HIGH (ref 0.26–1.65)
Lambda free light chains: 6 mg/L (ref 5.7–26.3)

## 2022-09-08 LAB — MULTIPLE MYELOMA PANEL, SERUM
Albumin SerPl Elph-Mcnc: 3.4 g/dL (ref 2.9–4.4)
Albumin/Glob SerPl: 1.5 (ref 0.7–1.7)
Alpha 1: 0.2 g/dL (ref 0.0–0.4)
Alpha2 Glob SerPl Elph-Mcnc: 0.8 g/dL (ref 0.4–1.0)
B-Globulin SerPl Elph-Mcnc: 0.8 g/dL (ref 0.7–1.3)
Gamma Glob SerPl Elph-Mcnc: 0.5 g/dL (ref 0.4–1.8)
Globulin, Total: 2.4 g/dL (ref 2.2–3.9)
IgA: 16 mg/dL — ABNORMAL LOW (ref 61–437)
IgG (Immunoglobin G), Serum: 582 mg/dL — ABNORMAL LOW (ref 603–1613)
IgM (Immunoglobulin M), Srm: 12 mg/dL — ABNORMAL LOW (ref 20–172)
M Protein SerPl Elph-Mcnc: 0.3 g/dL — ABNORMAL HIGH
Total Protein ELP: 5.8 g/dL — ABNORMAL LOW (ref 6.0–8.5)

## 2022-09-10 NOTE — Progress Notes (Signed)
Patient is on daratumumab as maintenance therapy without dexamethasone. Contacted provider, who approved reducing the offset time of daratumumab to 30 minutes, starting 10/02/2022.  Laray Anger, PharmD PGY-2 Pharmacy Resident Hematology/Oncology [phone]  09/10/2022 10:28 AM

## 2022-09-30 DIAGNOSIS — M5416 Radiculopathy, lumbar region: Secondary | ICD-10-CM | POA: Diagnosis not present

## 2022-10-02 ENCOUNTER — Inpatient Hospital Stay: Payer: Medicare Other

## 2022-10-02 ENCOUNTER — Telehealth: Payer: Self-pay

## 2022-10-02 DIAGNOSIS — C9000 Multiple myeloma not having achieved remission: Secondary | ICD-10-CM

## 2022-10-02 NOTE — Telephone Encounter (Signed)
Patient called to determine whether or not he would be able to make lab and infusion appointments today. Patient reports that he tried to call earlier to cancel as he is having diarrhea. Patient feels that is related to something in his diet. Patient increasing his fluids at this time and taking imodium to control diarrhea.  Patient reminded that he has access to after hours line over the weekend should he need anything.  Patient expressed appreciation for the call. Infusion aware and appointments canceled.

## 2022-10-07 ENCOUNTER — Telehealth: Payer: Self-pay

## 2022-10-07 ENCOUNTER — Other Ambulatory Visit: Payer: Self-pay | Admitting: Hematology and Oncology

## 2022-10-07 NOTE — Telephone Encounter (Signed)
I will omit his missed treatment and adjust his chemo plan No need to make up for his missed Rx

## 2022-10-07 NOTE — Telephone Encounter (Signed)
Called and given below message. He verbalized understanding. 

## 2022-10-07 NOTE — Telephone Encounter (Signed)
Pt called and asks if it is OK to receive RSV vaccine. Consulted with Hassan Rowan, RN to obtain Dr Alvy Bimler recommendation, who states it is up to the pt.  Let to pt know he can receive it if he would like and he verbalized he will go get it. He knows to call with any further concerns.

## 2022-10-17 ENCOUNTER — Other Ambulatory Visit: Payer: Self-pay | Admitting: Family Medicine

## 2022-10-17 DIAGNOSIS — E785 Hyperlipidemia, unspecified: Secondary | ICD-10-CM

## 2022-10-17 DIAGNOSIS — I2699 Other pulmonary embolism without acute cor pulmonale: Secondary | ICD-10-CM

## 2022-10-29 ENCOUNTER — Inpatient Hospital Stay: Payer: Medicare Other | Attending: Hematology and Oncology

## 2022-10-29 ENCOUNTER — Inpatient Hospital Stay: Payer: Medicare Other

## 2022-10-29 ENCOUNTER — Encounter: Payer: Self-pay | Admitting: Hematology and Oncology

## 2022-10-29 ENCOUNTER — Inpatient Hospital Stay (HOSPITAL_BASED_OUTPATIENT_CLINIC_OR_DEPARTMENT_OTHER): Payer: Medicare Other | Admitting: Hematology and Oncology

## 2022-10-29 VITALS — BP 132/92 | HR 84 | Temp 99.1°F | Resp 18 | Ht 68.5 in | Wt 248.6 lb

## 2022-10-29 DIAGNOSIS — C9000 Multiple myeloma not having achieved remission: Secondary | ICD-10-CM | POA: Insufficient documentation

## 2022-10-29 DIAGNOSIS — Z9484 Stem cells transplant status: Secondary | ICD-10-CM | POA: Diagnosis not present

## 2022-10-29 DIAGNOSIS — Z5112 Encounter for antineoplastic immunotherapy: Secondary | ICD-10-CM | POA: Diagnosis not present

## 2022-10-29 DIAGNOSIS — Z79624 Long term (current) use of inhibitors of nucleotide synthesis: Secondary | ICD-10-CM | POA: Diagnosis not present

## 2022-10-29 DIAGNOSIS — E669 Obesity, unspecified: Secondary | ICD-10-CM | POA: Insufficient documentation

## 2022-10-29 DIAGNOSIS — Z9481 Bone marrow transplant status: Secondary | ICD-10-CM | POA: Diagnosis not present

## 2022-10-29 DIAGNOSIS — Z79899 Other long term (current) drug therapy: Secondary | ICD-10-CM | POA: Diagnosis not present

## 2022-10-29 LAB — CBC WITH DIFFERENTIAL (CANCER CENTER ONLY)
Abs Immature Granulocytes: 0.01 10*3/uL (ref 0.00–0.07)
Basophils Absolute: 0 10*3/uL (ref 0.0–0.1)
Basophils Relative: 0 %
Eosinophils Absolute: 0.1 10*3/uL (ref 0.0–0.5)
Eosinophils Relative: 2 %
HCT: 42.6 % (ref 39.0–52.0)
Hemoglobin: 15.1 g/dL (ref 13.0–17.0)
Immature Granulocytes: 0 %
Lymphocytes Relative: 36 %
Lymphs Abs: 2.4 10*3/uL (ref 0.7–4.0)
MCH: 35.7 pg — ABNORMAL HIGH (ref 26.0–34.0)
MCHC: 35.4 g/dL (ref 30.0–36.0)
MCV: 100.7 fL — ABNORMAL HIGH (ref 80.0–100.0)
Monocytes Absolute: 1.1 10*3/uL — ABNORMAL HIGH (ref 0.1–1.0)
Monocytes Relative: 16 %
Neutro Abs: 3.1 10*3/uL (ref 1.7–7.7)
Neutrophils Relative %: 46 %
Platelet Count: 167 10*3/uL (ref 150–400)
RBC: 4.23 MIL/uL (ref 4.22–5.81)
RDW: 13.2 % (ref 11.5–15.5)
WBC Count: 6.7 10*3/uL (ref 4.0–10.5)
nRBC: 0 % (ref 0.0–0.2)

## 2022-10-29 LAB — CMP (CANCER CENTER ONLY)
ALT: 31 U/L (ref 0–44)
AST: 24 U/L (ref 15–41)
Albumin: 3.9 g/dL (ref 3.5–5.0)
Alkaline Phosphatase: 68 U/L (ref 38–126)
Anion gap: 4 — ABNORMAL LOW (ref 5–15)
BUN: 29 mg/dL — ABNORMAL HIGH (ref 8–23)
CO2: 29 mmol/L (ref 22–32)
Calcium: 9.4 mg/dL (ref 8.9–10.3)
Chloride: 105 mmol/L (ref 98–111)
Creatinine: 0.8 mg/dL (ref 0.61–1.24)
GFR, Estimated: >60 mL/min (ref >60)
Glucose, Bld: 60 mg/dL — ABNORMAL LOW (ref 70–99)
Potassium: 4.2 mmol/L (ref 3.5–5.1)
Sodium: 138 mmol/L (ref 135–145)
Total Bilirubin: 0.9 mg/dL (ref 0.3–1.2)
Total Protein: 6.5 g/dL (ref 6.5–8.1)

## 2022-10-29 MED ORDER — ACETAMINOPHEN 325 MG PO TABS
650.0000 mg | ORAL_TABLET | Freq: Once | ORAL | Status: AC
  Administered 2022-10-29: 650 mg via ORAL
  Filled 2022-10-29: qty 2

## 2022-10-29 MED ORDER — DIPHENHYDRAMINE HCL 25 MG PO CAPS
25.0000 mg | ORAL_CAPSULE | Freq: Once | ORAL | Status: AC
  Administered 2022-10-29: 25 mg via ORAL
  Filled 2022-10-29: qty 1

## 2022-10-29 MED ORDER — DARATUMUMAB-HYALURONIDASE-FIHJ 1800-30000 MG-UT/15ML ~~LOC~~ SOLN
1800.0000 mg | Freq: Once | SUBCUTANEOUS | Status: AC
  Administered 2022-10-29: 1800 mg via SUBCUTANEOUS
  Filled 2022-10-29: qty 15

## 2022-10-29 NOTE — Assessment & Plan Note (Signed)
His recent myeloma panel show stability We will proceed with treatment as scheduled He will be continue maintenance daratumumab only Zometa will be every 6 months, due in March 2024

## 2022-10-29 NOTE — Assessment & Plan Note (Signed)
The patient struggle with weight gain We discussed importance of dietary modification and lifestyle changes We discussed importance of weight loss to adjust to association with poor outcome with multiple myeloma

## 2022-10-29 NOTE — Progress Notes (Signed)
[place] OFFICE PROGRESS NOTE  Patient Care Team: Denita Lung, MD as PCP - General (Family Medicine) Heath Lark, MD as Consulting Physician (Hematology and Oncology)  ASSESSMENT & PLAN:  Multiple myeloma without remission [facility]) His recent myeloma panel show stability We will proceed with treatment as scheduled He will be continue maintenance daratumumab only Zometa will be every 6 months, due in March 2024  Obesity (BMI 30-39.9) The patient struggle with weight gain We discussed importance of dietary modification and lifestyle changes We discussed importance of weight loss to adjust to association with poor outcome with multiple myeloma  Orders Placed This Encounter  Procedures   Multiple Myeloma Panel (SPEP&IFE w/QIG)    Standing Status:   Future    Standing Expiration Date:   11/25/2023   Kappa/lambda light chains    Standing Status:   Future    Standing Expiration Date:   11/25/2023   CMP ([place] only)    Standing Status:   Future    Standing Expiration Date:   11/25/2023   CBC with Differential ([place] Only)    Standing Status:   Future    Standing Expiration Date:   11/25/2023    All questions were answered. The patient knows to call the clinic with any problems, questions or concerns. The total time spent in the appointment was 20 minutes encounter with patients including review of chart and various tests results, discussions about plan of care and coordination of care plan   Heath Lark, MD 10/29/2022 11:47 AM  INTERVAL HISTORY: Please see below for problem oriented charting. he returns for treatment follow-up on maintenance daratumumab for recurrent multiple myeloma He is doing well except for recent constipation Unfortunately, the patient has gained a lot of weight since last time he was seen He has no recent infection  REVIEW OF SYSTEMS:   Constitutional: Denies fevers, chills or abnormal weight loss Eyes: Denies blurriness of  vision Ears, nose, mouth, throat, and face: Denies mucositis or sore throat Respiratory: Denies cough, dyspnea or wheezes Cardiovascular: Denies palpitation, chest discomfort or lower extremity swelling Gastrointestinal:  Denies nausea, heartburn or change in bowel habits Skin: Denies abnormal skin rashes Lymphatics: Denies new lymphadenopathy or easy bruising Neurological:Denies numbness, tingling or new weaknesses Behavioral/Psych: Mood is stable, no new changes  All other systems were reviewed with the patient and are negative.  I have reviewed the past medical history, past surgical history, social history and family history with the patient and they are unchanged from previous note.  ALLERGIES:  has No Known Allergies.  MEDICATIONS:  Current Outpatient Medications  Medication Sig Dispense Refill   acetaminophen (TYLENOL) 650 MG CR tablet Take 650 mg by mouth every 8 (eight) hours as needed for pain.     acyclovir (ZOVIRAX) 400 MG tablet Take 1 tablet (400 mg total) by mouth daily. 90 tablet 1   atorvastatin (LIPITOR) 20 MG tablet TAKE 1 TABLET(20 MG) BY MOUTH DAILY 90 tablet 1   calcium carbonate (TUMS - DOSED IN MG ELEMENTAL CALCIUM) 500 MG chewable tablet Chew 1-2 tablets by mouth daily as needed for heartburn. (Patient not taking: Reported on 04/22/2022)     Cholecalciferol (VITAMIN D) 50 MCG (2000 UT) tablet Take 2,000 Units by mouth daily.     folic acid (FOLVITE) 161 MCG tablet Take 400 mcg by mouth daily.     losartan-hydrochlorothiazide (HYZAAR) 100-12.5 MG tablet TAKE 1 TABLET BY MOUTH DAILY 90 tablet 1   Multiple Vitamin (MULTI-VITAMIN) tablet  Take 1 tablet by mouth daily.     ondansetron (ZOFRAN) 8 MG tablet Take 1 tablet (8 mg total) by mouth 2 (two) times daily as needed (Nausea or vomiting). (Patient not taking: Reported on 04/22/2022) 30 tablet 1   oxyCODONE (ROXICODONE) 5 MG immediate release tablet Take 1 tablet (5 mg total) by mouth every 6 (six) hours as needed for  severe pain. 90 tablet 0   Polyethyl Glycol-Propyl Glycol (SYSTANE OP) Place 1 drop into both eyes daily as needed (dry eyes).     prochlorperazine (COMPAZINE) 10 MG tablet TAKE 1 TABLET(10 MG) BY MOUTH EVERY 6 HOURS AS NEEDED FOR NAUSEA OR VOMITING 30 tablet 1   vitamin B-12 (CYANOCOBALAMIN) 1000 MCG tablet Take 1,000 mcg by mouth daily.     No current facility-administered medications for this visit.   Facility-Administered Medications Ordered in Other Visits  Medication Dose Route Frequency Provider Last Rate Last Admin   acetaminophen (TYLENOL) tablet 650 mg  650 mg Oral Once Alvy Bimler, Mariangela Heldt, MD       daratumumab-hyaluronidase-fihj (DARZALEX FASPRO) 1800-30000 MG-UT/15ML chemo SQ injection 1,800 mg  1,800 mg Subcutaneous Once Alvy Bimler, Trequan Marsolek, MD       diphenhydrAMINE (BENADRYL) capsule 25 mg  25 mg Oral Once Heath Lark, MD        SUMMARY OF ONCOLOGIC HISTORY: Oncology History  Multiple myeloma without remission ([place])  03/20/2019 Imaging   1. Tumor involving the L5 and S1 and S2 segments of the spine as described with mass effects upon the left L4, L5, S1 and more distal left sacral nerves as described above. 2. Does the patient have a history of malignancy? 3. Multiple plasmacytomas and metastatic disease could give this appearance   03/27/2019 Pathology Results   Soft Tissue Needle Core Biopsy, sacral mass - PLASMABLASTIC NEOPLASM. - SEE COMMENT. Microscopic Comment The sections show needle core biopsy fragments of soft tissue densely infiltrated by a relatively monomorphic infiltrate of atypical plasmacytoid cells characterized by vesicular or partially clumped chromatin and prominent nucleoli. This is associated with scattered mitosis. A battery of immunohistochemical stains was performed and show that the atypical plasmacytoid cells are positive for CD138, CD43 and cytoplasmic kappa. There is also weak positivity for CD56 and partial variable positivity for cyclin D1. The atypical  plasmacytoid cells are negative for cytoplasmic lambda, CD10, PAX5, CD79a, CD20, CD3, CD5, CD34, EBV (ISH) and mostly negative for LCA. The overall morphologic and histologic features are most compatible with a plasmablastic neoplasm. The differential diagnosis includes plasmablastic lymphoma and plasmablastic plasmacytoma/myeloma. Based on the overall phenotypic features and the clinical setting, plasmacytoma/myeloma is favored. Clinical correlation and hematologic evaluation is recommended   03/27/2019 Procedure   Technically successful CT-guided biopsy of sacral soft tissue mass.   04/04/2019 Initial Diagnosis   Multiple myeloma without remission ([place])   04/11/2019 PET scan   IMPRESSION: 1. Previously noted expansile lesions involving the L5 vertebra and sacrum are again noted and exhibit intense FDG uptake compatible with metabolically active disease. A third focus of increased uptake without corresponding CT abnormality is noted within the intertrochanteric portions of the proximal right femur. 2. Small lucent lesion within the T3 vertebra is noted without corresponding FDG uptake. 3. Asymmetric left bladder wall thickening, etiology indeterminate. 4. Aortic Atherosclerosis (ICD10-I70.0). Lad coronary artery calcification.   04/12/2019 Cancer Staging   Staging form: Plasma Cell Myeloma and Plasma Cell Disorders, AJCC 8th Edition - Clinical stage from 04/12/2019: Beta-2-microglobulin (mg/L): 2, Albumin (g/dL): 3.6, ISS: Stage I, High-risk cytogenetics: Unknown,  LDH: Unknown - Signed by Heath Lark, MD on 04/12/2019   04/14/2019 Bone Marrow Biopsy   Bone Marrow, Aspirate,Biopsy, and Clot, right iliac bone - PLASMA CELL MYELOMA.   04/17/2019 - 07/11/2019 Chemotherapy   The patient had bortezemib, revlimid and dexamethasone for chemotherapy treatment.     08/17/2019 Bone Marrow Transplant   He received high dose melphalan followed by autologous stem cell transplant at [facility]   11/22/2019 Bone  Marrow Biopsy   BONE MARROW biopsy at [facility]:       Mild monoclonal plasmacytosis (approximately 2%), kappa light chain restricted.    11/22/2019 PET scan   PET CT at [facility] 1.  Similar size and appearance of mildly hypermetabolic lytic lesion in the L5 vertebral body and posterior elements.  2.  Otherwise, no substantial FDG uptake compared to background bone marrow in the additional osseous lucent lesions.    12/20/2019 - 05/09/2020 Radiation Therapy   Radiation Treatment Dates: 12/20/2019 through 01/08/2020 Site Technique Total Dose (Gy) Dose per Fx (Gy) Completed Fx Beam Energies  Lumbar Spine: Spine 3D 35/35 2.5 14/14 15X        02/29/2020 PET scan   1. Similar size and appearance of expansile lytic lesion in the L5 vertebral body and posterior elements with minimal FDG activity.  2. Otherwise, no additional osseous lucent lesions with FDG uptake above background bone marrow   05/31/2020 -  Chemotherapy   The patient had Revlimid for chemotherapy treatment.     11/25/2021 - 05/11/2022 Chemotherapy   Patient is on Treatment Plan : MYELOMA RELAPSED / REFRACTORY Daratumumab SQ + Bortezomib + Dexamethasone (DaraVd) q21d / Daratumumab SQ q28d      11/25/2021 -  Chemotherapy   Patient is on Treatment Plan : MYELOMA RELAPSED / REFRACTORY Daratumumab SQ + Bortezomib + Dexamethasone (DaraVd) q21d / Daratumumab SQ q28d        PHYSICAL EXAMINATION: ECOG PERFORMANCE STATUS: 1 - Symptomatic but completely ambulatory  Vitals:   10/29/22 1122  BP: (!) 132/92  Pulse: 84  Resp: 18  Temp: 99.1 F (37.3 C)  SpO2: 97%   Filed Weights   10/29/22 1122  Weight: 248 lb 9.6 oz (112.8 kg)    GENERAL:alert, no distress and comfortable NEURO: alert & oriented x 3 with fluent speech, no focal motor/sensory deficits  LABORATORY DATA:  I have reviewed the data as listed    Component Value Date/Time   NA 138 10/29/2022 1046   NA 139 10/11/2018 1115   NA 139 04/05/2014 0849   K 4.2  10/29/2022 1046   K 4.4 04/05/2014 0849   CL 105 10/29/2022 1046   CO2 29 10/29/2022 1046   CO2 23 04/05/2014 0849   GLUCOSE 60 (L) 10/29/2022 1046   GLUCOSE 109 04/05/2014 0849   BUN 29 (H) 10/29/2022 1046   BUN 23 10/11/2018 1115   BUN 16.8 04/05/2014 0849   CREATININE 0.80 10/29/2022 1046   CREATININE 0.91 07/29/2017 1004   CREATININE 0.9 04/05/2014 0849   CALCIUM 9.4 10/29/2022 1046   CALCIUM 8.7 04/05/2014 0849   PROT 6.5 10/29/2022 1046   PROT 7.2 10/11/2018 1115   PROT 6.7 04/05/2014 0849   ALBUMIN 3.9 10/29/2022 1046   ALBUMIN 4.7 10/11/2018 1115   ALBUMIN 3.8 04/05/2014 0849   AST 24 10/29/2022 1046   AST 26 04/05/2014 0849   ALT 31 10/29/2022 1046   ALT 30 04/05/2014 0849   ALKPHOS 68 10/29/2022 1046   ALKPHOS 75 04/05/2014 0849  BILITOT 0.9 10/29/2022 1046   BILITOT 0.71 04/05/2014 0849   GFRNONAA >60 10/29/2022 1046   GFRAA >60 06/14/2020 0830    No results found for: "SPEP", "UPEP"  Lab Results  Component Value Date   WBC 6.7 10/29/2022   NEUTROABS 3.1 10/29/2022   HGB 15.1 10/29/2022   HCT 42.6 10/29/2022   MCV 100.7 (H) 10/29/2022   PLT 167 10/29/2022      Chemistry      Component Value Date/Time   NA 138 10/29/2022 1046   NA 139 10/11/2018 1115   NA 139 04/05/2014 0849   K 4.2 10/29/2022 1046   K 4.4 04/05/2014 0849   CL 105 10/29/2022 1046   CO2 29 10/29/2022 1046   CO2 23 04/05/2014 0849   BUN 29 (H) 10/29/2022 1046   BUN 23 10/11/2018 1115   BUN 16.8 04/05/2014 0849   CREATININE 0.80 10/29/2022 1046   CREATININE 0.91 07/29/2017 1004   CREATININE 0.9 04/05/2014 0849   GLU 10 04/10/2014 0000      Component Value Date/Time   CALCIUM 9.4 10/29/2022 1046   CALCIUM 8.7 04/05/2014 0849   ALKPHOS 68 10/29/2022 1046   ALKPHOS 75 04/05/2014 0849   AST 24 10/29/2022 1046   AST 26 04/05/2014 0849   ALT 31 10/29/2022 1046   ALT 30 04/05/2014 0849   BILITOT 0.9 10/29/2022 1046   BILITOT 0.71 04/05/2014 0849

## 2022-10-30 LAB — KAPPA/LAMBDA LIGHT CHAINS
Kappa free light chain: 20.8 mg/L — ABNORMAL HIGH (ref 3.3–19.4)
Kappa, lambda light chain ratio: 3.06 — ABNORMAL HIGH (ref 0.26–1.65)
Lambda free light chains: 6.8 mg/L (ref 5.7–26.3)

## 2022-11-02 LAB — MULTIPLE MYELOMA PANEL, SERUM
Albumin SerPl Elph-Mcnc: 3.6 g/dL (ref 2.9–4.4)
Albumin/Glob SerPl: 1.5 (ref 0.7–1.7)
Alpha 1: 0.2 g/dL (ref 0.0–0.4)
Alpha2 Glob SerPl Elph-Mcnc: 0.7 g/dL (ref 0.4–1.0)
B-Globulin SerPl Elph-Mcnc: 0.8 g/dL (ref 0.7–1.3)
Gamma Glob SerPl Elph-Mcnc: 0.7 g/dL (ref 0.4–1.8)
Globulin, Total: 2.5 g/dL (ref 2.2–3.9)
IgA: 21 mg/dL — ABNORMAL LOW (ref 61–437)
IgG (Immunoglobin G), Serum: 737 mg/dL (ref 603–1613)
IgM (Immunoglobulin M), Srm: 13 mg/dL — ABNORMAL LOW (ref 15–143)
M Protein SerPl Elph-Mcnc: 0.5 g/dL — ABNORMAL HIGH
Total Protein ELP: 6.1 g/dL (ref 6.0–8.5)

## 2022-11-03 ENCOUNTER — Telehealth: Payer: Self-pay

## 2022-11-03 NOTE — Telephone Encounter (Signed)
He called back. Given message from Dr. Alvy Bimler. Appt scheduled at 1020 on 2/6. He verbalized understanding and is aware of appt.

## 2022-11-03 NOTE — Telephone Encounter (Signed)
-----  Message from Heath Lark, MD sent at 11/03/2022  7:59 AM EST ----- Regarding: myeloma panel His myeloma panel is worse, I need to restart chemo Can you call and make an appt next week 20 mins only appt to discuss>? Do not overbook

## 2022-11-03 NOTE — Telephone Encounter (Signed)
Called and left a message asking him to call the office back. 

## 2022-11-10 ENCOUNTER — Telehealth: Payer: Self-pay

## 2022-11-10 ENCOUNTER — Telehealth: Payer: Self-pay | Admitting: Pharmacy Technician

## 2022-11-10 ENCOUNTER — Ambulatory Visit (INDEPENDENT_AMBULATORY_CARE_PROVIDER_SITE_OTHER): Payer: Medicare Other | Admitting: Podiatry

## 2022-11-10 ENCOUNTER — Inpatient Hospital Stay: Payer: Medicare Other | Attending: Hematology and Oncology | Admitting: Hematology and Oncology

## 2022-11-10 ENCOUNTER — Encounter: Payer: Self-pay | Admitting: Hematology and Oncology

## 2022-11-10 ENCOUNTER — Other Ambulatory Visit (HOSPITAL_COMMUNITY): Payer: Self-pay

## 2022-11-10 DIAGNOSIS — M216X1 Other acquired deformities of right foot: Secondary | ICD-10-CM | POA: Diagnosis not present

## 2022-11-10 DIAGNOSIS — E669 Obesity, unspecified: Secondary | ICD-10-CM | POA: Insufficient documentation

## 2022-11-10 DIAGNOSIS — I2510 Atherosclerotic heart disease of native coronary artery without angina pectoris: Secondary | ICD-10-CM | POA: Diagnosis not present

## 2022-11-10 DIAGNOSIS — Z923 Personal history of irradiation: Secondary | ICD-10-CM | POA: Diagnosis not present

## 2022-11-10 DIAGNOSIS — I700 Atherosclerosis of aorta: Secondary | ICD-10-CM | POA: Diagnosis not present

## 2022-11-10 DIAGNOSIS — Z79624 Long term (current) use of inhibitors of nucleotide synthesis: Secondary | ICD-10-CM | POA: Diagnosis not present

## 2022-11-10 DIAGNOSIS — Z9221 Personal history of antineoplastic chemotherapy: Secondary | ICD-10-CM | POA: Insufficient documentation

## 2022-11-10 DIAGNOSIS — G629 Polyneuropathy, unspecified: Secondary | ICD-10-CM | POA: Diagnosis not present

## 2022-11-10 DIAGNOSIS — Z7901 Long term (current) use of anticoagulants: Secondary | ICD-10-CM | POA: Diagnosis not present

## 2022-11-10 DIAGNOSIS — C9000 Multiple myeloma not having achieved remission: Secondary | ICD-10-CM | POA: Diagnosis not present

## 2022-11-10 DIAGNOSIS — Z79899 Other long term (current) drug therapy: Secondary | ICD-10-CM | POA: Diagnosis not present

## 2022-11-10 DIAGNOSIS — Z7961 Long term (current) use of immunomodulator: Secondary | ICD-10-CM | POA: Diagnosis not present

## 2022-11-10 DIAGNOSIS — L84 Corns and callosities: Secondary | ICD-10-CM

## 2022-11-10 MED ORDER — IXAZOMIB CITRATE 3 MG PO CAPS
ORAL_CAPSULE | ORAL | 0 refills | Status: DC
  Filled 2022-11-10: qty 3, fill #0

## 2022-11-10 MED ORDER — RIVAROXABAN 20 MG PO TABS: 20.0000 mg | ORAL_TABLET | Freq: Every day | ORAL | 1 refills | Status: DC

## 2022-11-10 MED ORDER — ACYCLOVIR 400 MG PO TABS: 400.0000 mg | ORAL_TABLET | Freq: Every day | ORAL | 1 refills | Status: DC

## 2022-11-10 MED ORDER — DEXAMETHASONE 4 MG PO TABS: ORAL_TABLET | ORAL | 6 refills | Status: DC

## 2022-11-10 MED ORDER — POMALIDOMIDE 3 MG PO CAPS
ORAL_CAPSULE | ORAL | 0 refills | Status: DC
  Filled 2022-11-10: qty 21, fill #0

## 2022-11-10 MED ORDER — POMALIDOMIDE 3 MG PO CAPS: ORAL_CAPSULE | ORAL | 0 refills | Status: DC

## 2022-11-10 MED ORDER — IXAZOMIB CITRATE 3 MG PO CAPS: ORAL_CAPSULE | ORAL | 0 refills | Status: DC

## 2022-11-10 NOTE — Progress Notes (Signed)
Subjective:   Patient ID: Jesse Guest., adult   DOB: 72 y.o.   MRN: [id number]   HPI Chief Complaint  Patient presents with   Callouses    Left great toe   72 year old male presents the office today with above concerns.  He has a skin lesion on the plantar aspect of the left foot submetatarsal 1 that hurts to walk.  No open lesions.  No swelling redness or drainage.  No injuries.  No other treatment.    He is on xarelto  Review of Systems  All other systems reviewed and are negative.  Past Medical History:  Diagnosis Date   BPH (benign prostatic hypertrophy)    Degenerative disc disease    Diverticulosis    Hemochromatosis    HTN (hypertension)    Hyperlipidemia    Multiple myeloma (HCC)    Polio    Status post Nissen fundoplication (without gastrostomy tube) procedure    for hiatal hernia.    Past Surgical History:  Procedure Laterality Date   FOOT SURGERY     HIATAL HERNIA REPAIR     OTHER SURGICAL HISTORY     Cataract Surgery--Both eyes   SKIN BIOPSY Right 04/13/2022   squamous cell carcinoma in stiu arising in actinic keratosis   TOTAL KNEE ARTHROPLASTY Left 07/15/2021   Procedure: TOTAL KNEE ARTHROPLASTY;  Surgeon: Marchia Bond, MD;  Location: [facility];  Service: Orthopedics;  Laterality: Left;   Undescended testes Right 10/05/1966     Current Outpatient Medications:    acetaminophen (TYLENOL) 650 MG CR tablet, Take 650 mg by mouth every 8 (eight) hours as needed for pain., Disp: , Rfl:    acyclovir (ZOVIRAX) 400 MG tablet, Take 1 tablet (400 mg total) by mouth daily., Disp: 90 tablet, Rfl: 1   atorvastatin (LIPITOR) 20 MG tablet, TAKE 1 TABLET(20 MG) BY MOUTH DAILY, Disp: 90 tablet, Rfl: 1   calcium carbonate (TUMS - DOSED IN MG ELEMENTAL CALCIUM) 500 MG chewable tablet, Chew 1-2 tablets by mouth daily as needed for heartburn., Disp: , Rfl:    Cholecalciferol (VITAMIN D) 50 MCG (2000 UT) tablet, Take 2,000 Units by mouth daily., Disp: , Rfl:     dexamethasone (DECADRON) 4 MG tablet, 3 tabs weekly, Disp: 12 tablet, Rfl: 6   folic acid (FOLVITE) A999333 MCG tablet, Take 400 mcg by mouth daily., Disp: , Rfl:    losartan-hydrochlorothiazide (HYZAAR) 100-12.5 MG tablet, TAKE 1 TABLET BY MOUTH DAILY, Disp: 90 tablet, Rfl: 1   Multiple Vitamin (MULTI-VITAMIN) tablet, Take 1 tablet by mouth daily., Disp: , Rfl:    ondansetron (ZOFRAN) 8 MG tablet, Take 1 tablet (8 mg total) by mouth 2 (two) times daily as needed (Nausea or vomiting)., Disp: 30 tablet, Rfl: 1   Polyethyl Glycol-Propyl Glycol (SYSTANE OP), Place 1 drop into both eyes daily as needed (dry eyes)., Disp: , Rfl:    prochlorperazine (COMPAZINE) 10 MG tablet, TAKE 1 TABLET(10 MG) BY MOUTH EVERY 6 HOURS AS NEEDED FOR NAUSEA OR VOMITING, Disp: 30 tablet, Rfl: 1   rivaroxaban (XARELTO) 20 MG TABS tablet, Take 1 tablet (20 mg total) by mouth daily with supper., Disp: 90 tablet, Rfl: 1   vitamin B-12 (CYANOCOBALAMIN) 1000 MCG tablet, Take 1,000 mcg by mouth daily., Disp: , Rfl:    ixazomib citrate (NINLARO) 3 MG capsule, Take 1 capsule (3 mg) by mouth weekly, 3 weeks on, 1 week off, repeat every 4 weeks. Take on an empty stomach 1hr before or 2hr after meals.,  Disp: 3 capsule, Rfl: 0   oxyCODONE (ROXICODONE) 5 MG immediate release tablet, Take 1 tablet (5 mg total) by mouth every 6 (six) hours as needed for severe pain., Disp: 90 tablet, Rfl: 0   pomalidomide (POMALYST) 3 MG capsule, Take 1 capsule (67m) by mouth daily for 21 days, rest 7 days, repeat every 28 days., Disp: 21 capsule, Rfl: 0  No Known Allergies         Objective:  Physical Exam  General: AAO x3, NAD  Dermatological: Hyperkeratotic lesion noted left foot submetatarsal 1 without any underlying ulceration drainage or signs of infection.  There are no open lesions.  Vascular: Dorsalis Pedis artery and Posterior Tibial artery pedal pulses are 2/4 bilateral with immedate capillary fill time. There is no pain with calf  compression, swelling, warmth, erythema.   Neruologic: Grossly intact via light touch bilateral.   Musculoskeletal: Prominence of metatarsal head plantarly with atrophy of the fat pad.  No other areas of discomfort.  Gait: Unassisted, Nonantalgic.       Assessment:   Skin lesion right foot; chronic anticoagulation    Plan:  -Treatment options discussed including all alternatives, risks, and complications -Etiology of symptoms were discussed -Sharply debrided lesion with any complications or bleeding.  Discussed moisturizer, offloading.  Dispensed offloading pads.  MTrula SladeDPM

## 2022-11-10 NOTE — Assessment & Plan Note (Signed)
I reviewed multiple test results with the patient and his partner Unfortunately, he has signs of disease progression We discussed treatment options and reviewed the guidelines  This is based on recent FDA approval and publications as follows  We discussed the role of treatment is strictly palliative  [facility]iance [id number]. a Phase I/II Study of Pomalidomide, Dexamethasone and Ixazomib Versus Pomalidomide and Dexamethasone for Patients with Multiple Myeloma Refractory to Lenalidomide and Proteasome Inhibitor Based Therapy: Phase I Results [place]der [place]ade. [place], [place], [place], [place], [place] A. [place], [place], [place], [place] and [place]  Blood 2015 126:375;  BACKGROUND: Pomalidomide-dexamethasone (Pom-Dex) represents an important therapeutic advance for the treatment of patients with relapsed/refractory multiple myeloma (MM). Nonetheless, patients with lenalidomide (Len) and bortezomib (double) refractory disease have a median progression-free survival of only 3.7 months with the combination, thus highlighting the need for further progress. Ixazomib, a novel, oral proteasome inhibitor, has pharmacodynamic properties that may allow for improved proteasome inhibition at the tumoral level and has synergistic activity in combination with IMiDs in preclinical MM models. Clinical studies have demonstrated activity as a single agent in relapsed/refractory MM and in combination with Len-Dex in newly diagnosed patients. We therefore evaluated the safety and preliminary efficacy of the combination of Pom, ixazomib and Dex in patients with Len and proteasome inhibitor (PI) refractory MM in the phase I portion of [facility] study [id number]. METHODS: Key eligibility criteria included: relapsed or relapsed/refractory disease; ?2 prior lines of therapy; double refractory disease (progression on or ?60 days from the last dose of a Len- and PI-based therapy);  ECOG performance status (PS) ?2; absolute neutrophil count ?1.0 x 109/L, platelets ?50 x 109/L, creatinine clearance ?50 mL/min; and ?grade 2 peripheral neuropathy. Patients were treated with escalating doses of Pom and ixazomib utilizing a standard 3 + 3 dose escalation design in combination with standard dose Dex (Table 1). Patients received 2 - 4 mg of Pom on days 1 - 21; ixazomib 3 - 4 mg on days 1, 8 and 15; and Dex 40 mg (20 mg for those >59 years of age) on days 1, 8, 15 and 22 of a 28-day cycle. Patients were treated until disease progression or the development of unacceptable toxicity RESULTS: Out of 17 evaluable patients, the median age was 22 (range 67 - 61) and the median time from diagnosis 5.5 years (range 3.3 - 8.3). Sixty-five percent, 24% and 12% had ISS stage 1, 2 and 3 disease at the start of treatment, respectively, and 65% had high-risk cytogenetics (gain of 1q, del[17p] and/or high-risk IgH translocation). All patients had an ECOG PS of 0 or 1 (59% and 41%, respectively). Of 14 patients with complete prior treatment data available, 100% had received prior Len, bortezomib and Dex, 71% alkylating agents (cyclophosphamide or low dose melphalan), 71% autologous stem cell transplant and 29% carfilzomib. Eighty-two percent were refractory to sequential Len- and bortezomib regimens, whereas 6% were refractory to a prior Len/proteasome inhibitor combination (12% not reported). Two dose limiting toxicities have occurred to date, 1 each at dose levels 3 and 4 (Table 1). Grade 3 and 4 neutropenia, thrombocytopenia and lymphopenia attributable to protocol therapy have been seen in 29%/6%, 12%/6% and 29%/0% of patients, respectively. Twelve percent of patients have experienced grade 3 infection, regardless of attribution, but none have experienced ?grade 4 infection. The incidence of therapy-related peripheral neuropathy was 24%, none of which was ?grade 3 in severity. Other common, therapy-related  adverse events have  included fatigue (53%), nausea (24%), constipation (18%), diarrhea (29%), rash (18%), tremor (24%), anxiety (18%), insomnia (29%) and edema (24%), all of which were ?grade 2 in severity. Fifty-three percent of patients had at least 1 of the 3 medications dose reduced, and 35% experienced dose delays. To date, 1 patient has discontinued therapy due to adverse events and 1 due to refusal of further therapy. Of 13 patients receiving >1 cycle of therapy, the best overall response rate was 62% (7 partial responses, 1 very good partial response). CONCLUSIONS: The Pom, ixazomib and Dex combination has demonstrated an acceptable toxicity profile thus far with encouraging preliminary efficacy. Updated results of the phase I portion of the study will be presented at the meeting, including the maximum tolerated dose that will be used in the randomized, phase II portion of the study, in which the 3-drug combination will be compared with Pom-Dex in double refractory MM patients.  Some of the short term side-effects included, though not limited to, risk of fatigue, weight loss, tumor lysis syndrome, risk of allergic reactions, pancytopenia, risks of blood clots, life-threatening infections, need for transfusions of blood products, admission to hospital for various reasons, and risks of death.   The patient is aware that the response rates discussed earlier is not guaranteed.    After a long discussion, patient made an informed decision to proceed with the prescribed plan of care.    He will continue to take acyclovir for antimicrobial prophylaxis He will continue calcium with vitamin D supplement We will continue Zometa every 3 months He will continue his Xarelto indefinitely as secondary prevention against risk of PE I am hopeful we can start him on treatment on 2/13 Due to his weight and propensity for weight gain, I will reduce weekly dexamethasone to 12 mg weekly I will start Pomalyst and  Ninlaro with slight upfront dose adjustment of 3 mg each

## 2022-11-10 NOTE — Telephone Encounter (Signed)
Oral Oncology Patient Advocate Encounter   Was successful in securing patient a $7,500 grant from [employer] (PAF) to provide copayment coverage for Pomalyst/Ninlaro.  This will keep the out of pocket expense at $0.     I have spoken with the patient.    The billing information is as follows and has been shared with [facility].   RxBin: [id number] PCN:  [id number] Member ID: [id number] Group ID: [id number] Dates of Eligibility: 11/10/22 through 11/10/23  Lady Deutscher, CPhT-Adv Oncology Pharm[facility] Patient [place] Direct Number: [phone]  Fax: [phone]

## 2022-11-10 NOTE — Progress Notes (Signed)
[facility] OFF[facility] PROGRESS NOTE  Patient Care Team: Denita Lung, MD as PCP - General (Family Medicine) Heath Lark, MD as Consulting Physician (Hematology and Oncology)  ASSESSMENT & PLAN:  Multiple myeloma without remission [facility]) I reviewed multiple test results with the patient and his partner Unfortunately, he has signs of disease progression We discussed treatment options and reviewed the guidelines  This is based on recent FDA approval and publications as follows  We discussed the role of treatment is strictly palliative  Alliance [phone]. a Phase I/II Study of Pomalidomide, Dexamethasone and Ixazomib Versus Pomalidomide and Dexamethasone for Patients with Multiple Myeloma Refractory to Lenalidomide and Proteasome Inhibitor Based Therapy: Phase I Results Ander Slade. [place], Flora Cobb, [place], [place], [place] A. Raeford Razor, [place], [place], [place] and [place]  Blood 2015 126:375;  BACKGROUND: Pomalidomide-dexamethasone (Pom-Dex) represents an important therapeutic advance for the treatment of patients with relapsed/refractory multiple myeloma (MM). Nonetheless, patients with lenalidomide (Len) and bortezomib (double) refractory disease have a median progression-free survival of only 3.7 months with the combination, thus highlighting the need for further progress. Ixazomib, a novel, oral proteasome inhibitor, has pharmacodynamic properties that may allow for improved proteasome inhibition at the tumoral level and has synergistic activity in combination with IMiDs in preclinical MM models. Clinical studies have demonstrated activity as a single agent in relapsed/refractory MM and in combination with Len-Dex in newly diagnosed patients. We therefore evaluated the safety and preliminary efficacy of the combination of Pom, ixazomib and Dex in patients with Len and proteasome inhibitor (PI) refractory MM in the phase  I portion of Alliance study [id number]. METHODS: Key eligibility criteria included: relapsed or relapsed/refractory disease; ?2 prior lines of therapy; double refractory disease (progression on or ?60 days from the last dose of a Len- and PI-based therapy); ECOG performance status (PS) ?2; absolute neutrophil count ?1.0 x 109/L, platelets ?50 x 109/L, creatinine clearance ?50 mL/min; and ?grade 2 peripheral neuropathy. Patients were treated with escalating doses of Pom and ixazomib utilizing a standard 3 + 3 dose escalation design in combination with standard dose Dex (Table 1). Patients received 2 - 4 mg of Pom on days 1 - 21; ixazomib 3 - 4 mg on days 1, 8 and 15; and Dex 40 mg (20 mg for those >59 years of age) on days 1, 8, 15 and 22 of a 28-day cycle. Patients were treated until disease progression or the development of unacceptable toxicity RESULTS: Out of 17 evaluable patients, the median age was 62 (range 8 - 56) and the median time from diagnosis 5.5 years (range 3.3 - 8.3). Sixty-five percent, 24% and 12% had ISS stage 1, 2 and 3 disease at the start of treatment, respectively, and 65% had high-risk cytogenetics (gain of 1q, del[17p] and/or high-risk IgH translocation). All patients had an ECOG PS of 0 or 1 (59% and 41%, respectively). Of 14 patients with complete prior treatment data available, 100% had received prior Len, bortezomib and Dex, 71% alkylating agents (cyclophosphamide or low dose melphalan), 71% autologous stem cell transplant and 29% carfilzomib. Eighty-two percent were refractory to sequential Len- and bortezomib regimens, whereas 6% were refractory to a prior Len/proteasome inhibitor combination (12% not reported). Two dose limiting toxicities have occurred to date, 1 each at dose levels 3 and 4 (Table 1). Grade 3 and 4 neutropenia, thrombocytopenia and lymphopenia attributable to protocol therapy have been seen in 29%/6%, 12%/6% and 29%/0% of patients, respectively. Twelve percent  of  patients have experienced grade 3 infection, regardless of attribution, but none have experienced ?grade 4 infection. The incidence of therapy-related peripheral neuropathy was 24%, none of which was ?grade 3 in severity. Other common, therapy-related adverse events have included fatigue (53%), nausea (24%), constipation (18%), diarrhea (29%), rash (18%), tremor (24%), anxiety (18%), insomnia (29%) and edema (24%), all of which were ?grade 2 in severity. Fifty-three percent of patients had at least 1 of the 3 medications dose reduced, and 35% experienced dose delays. To date, 1 patient has discontinued therapy due to adverse events and 1 due to refusal of further therapy. Of 13 patients receiving >1 cycle of therapy, the best overall response rate was 62% (7 partial responses, 1 very good partial response). CONCLUSIONS: The Pom, ixazomib and Dex combination has demonstrated an acceptable toxicity profile thus far with encouraging preliminary efficacy. Updated results of the phase I portion of the study will be presented at the meeting, including the maximum tolerated dose that will be used in the randomized, phase II portion of the study, in which the 3-drug combination will be compared with Pom-Dex in double refractory MM patients.  Some of the short term side-effects included, though not limited to, risk of fatigue, weight loss, tumor lysis syndrome, risk of allergic reactions, pancytopenia, risks of blood clots, life-threatening infections, need for transfusions of blood products, admission to hospital for various reasons, and risks of death.   The patient is aware that the response rates discussed earlier is not guaranteed.    After a long discussion, patient made an informed decision to proceed with the prescribed plan of care.    He will continue to take acyclovir for antimicrobial prophylaxis He will continue calcium with vitamin D supplement We will continue Zometa every 3 months He will  continue his Xarelto indefinitely as secondary prevention against risk of PE I am hopeful we can start him on treatment on 2/13 Due to his weight and propensity for weight gain, I will reduce weekly dexamethasone to 12 mg weekly I will start Pomalyst and Ninlaro with slight upfront dose adjustment of 3 mg each  Orders Placed This Encounter  Procedures   CBC with Differential/Platelet    Standing Status:   Standing    Number of Occurrences:   22    Standing Expiration Date:   11/11/2023   Comprehensive metabolic panel    Standing Status:   Standing    Number of Occurrences:   33    Standing Expiration Date:   11/11/2023    All questions were answered. The patient knows to call the clinic with any problems, questions or concerns. The total time spent in the appointment was 40 minutes encounter with patients including review of chart and various tests results, discussions about plan of care and coordination of care plan   Heath Lark, MD 11/10/2022 1:02 PM  INTERVAL HISTORY: Please see below for problem oriented charting. he returns for treatment follow-up with his partner/husband He is doing well Denies bone pain He has very minimal residual neuropathy from prior chemo We spent majority of our time reviewing test results and discussed treatment options  REVIEW OF SYSTEMS:   Constitutional: Denies fevers, chills or abnormal weight loss Eyes: Denies blurriness of vision Ears, nose, mouth, throat, and face: Denies mucositis or sore throat Respiratory: Denies cough, dyspnea or wheezes Cardiovascular: Denies palpitation, chest discomfort or lower extremity swelling Gastrointestinal:  Denies nausea, heartburn or change in bowel habits Skin: Denies abnormal skin rashes Lymphatics:  Denies new lymphadenopathy or easy bruising Neurological:Denies numbness, tingling or new weaknesses Behavioral/Psych: Mood is stable, no new changes  All other systems were reviewed with the patient and are  negative.  I have reviewed the past medical history, past surgical history, social history and family history with the patient and they are unchanged from previous note.  ALLERGIES:  has No Known Allergies.  MEDICATIONS:  Current Outpatient Medications  Medication Sig Dispense Refill   dexamethasone (DECADRON) 4 MG tablet 3 tabs weekly 12 tablet 6   ixazomib citrate (NINLARO) 3 MG capsule Take 1 capsule (3 mg) by mouth weekly, 3 weeks on, 1 week off, repeat every 4 weeks. Take on an empty stomach 1hr before or 2hr after meals. 3 capsule 0   pomalidomide (POMALYST) 3 MG capsule Take 1 capsule daily for 21 days by mouth, rest 7 days, for cycle of every 28 days 21 capsule 0   rivaroxaban (XARELTO) 20 MG TABS tablet Take 1 tablet (20 mg total) by mouth daily with supper. 90 tablet 1   acetaminophen (TYLENOL) 650 MG CR tablet Take 650 mg by mouth every 8 (eight) hours as needed for pain.     acyclovir (ZOVIRAX) 400 MG tablet Take 1 tablet (400 mg total) by mouth daily. 90 tablet 1   atorvastatin (LIPITOR) 20 MG tablet TAKE 1 TABLET(20 MG) BY MOUTH DAILY 90 tablet 1   calcium carbonate (TUMS - DOSED IN MG ELEMENTAL CALCIUM) 500 MG chewable tablet Chew 1-2 tablets by mouth daily as needed for heartburn. (Patient not taking: Reported on 04/22/2022)     Cholecalciferol (VITAMIN D) 50 MCG (2000 UT) tablet Take 2,000 Units by mouth daily.     folic acid (FOLVITE) 956 MCG tablet Take 400 mcg by mouth daily.     losartan-hydrochlorothiazide (HYZAAR) 100-12.5 MG tablet TAKE 1 TABLET BY MOUTH DAILY 90 tablet 1   Multiple Vitamin (MULTI-VITAMIN) tablet Take 1 tablet by mouth daily.     ondansetron (ZOFRAN) 8 MG tablet Take 1 tablet (8 mg total) by mouth 2 (two) times daily as needed (Nausea or vomiting). (Patient not taking: Reported on 04/22/2022) 30 tablet 1   oxyCODONE (ROXICODONE) 5 MG immediate release tablet Take 1 tablet (5 mg total) by mouth every 6 (six) hours as needed for severe pain. 90 tablet 0    Polyethyl Glycol-Propyl Glycol (SYSTANE OP) Place 1 drop into both eyes daily as needed (dry eyes).     prochlorperazine (COMPAZINE) 10 MG tablet TAKE 1 TABLET(10 MG) BY MOUTH EVERY 6 HOURS AS NEEDED FOR NAUSEA OR VOMITING 30 tablet 1   vitamin B-12 (CYANOCOBALAMIN) 1000 MCG tablet Take 1,000 mcg by mouth daily.     No current facility-administered medications for this visit.    SUMMARY OF ONCOLOGIC HISTORY: Oncology History  Multiple myeloma without remission ([place])  03/20/2019 Imaging   1. Tumor involving the L5 and S1 and S2 segments of the spine as described with mass effects upon the left L4, L5, S1 and more distal left sacral nerves as described above. 2. Does the patient have a history of malignancy? 3. Multiple plasmacytomas and metastatic disease could give this appearance   03/27/2019 Pathology Results   Soft Tissue Needle Core Biopsy, sacral mass - PLASMABLASTIC NEOPLASM. - SEE COMMENT. Microscopic Comment The sections show needle core biopsy fragments of soft tissue densely infiltrated by a relatively monomorphic infiltrate of atypical plasmacytoid cells characterized by vesicular or partially clumped chromatin and prominent nucleoli. This is associated with scattered mitosis. A  battery of immunohistochemical stains was performed and show that the atypical plasmacytoid cells are positive for CD138, CD43 and cytoplasmic kappa. There is also weak positivity for CD56 and partial variable positivity for cyclin D1. The atypical plasmacytoid cells are negative for cytoplasmic lambda, CD10, PAX5, CD79a, CD20, CD3, CD5, CD34, EBV (ISH) and mostly negative for LCA. The overall morphologic and histologic features are most compatible with a plasmablastic neoplasm. The differential diagnosis includes plasmablastic lymphoma and plasmablastic plasmacytoma/myeloma. Based on the overall phenotypic features and the clinical setting, plasmacytoma/myeloma is favored. Clinical correlation and hematologic  evaluation is recommended   03/27/2019 Procedure   Technically successful CT-guided biopsy of sacral soft tissue mass.   04/04/2019 Initial Diagnosis   Multiple myeloma without remission ([place])   04/11/2019 PET scan   IMPRESSION: 1. Previously noted expansile lesions involving the L5 vertebra and sacrum are again noted and exhibit intense FDG uptake compatible with metabolically active disease. A third focus of increased uptake without corresponding CT abnormality is noted within the intertrochanteric portions of the proximal right femur. 2. Small lucent lesion within the T3 vertebra is noted without corresponding FDG uptake. 3. Asymmetric left bladder wall thickening, etiology indeterminate. 4. Aortic Atherosclerosis (ICD10-I70.0). Lad coronary artery calcification.   04/12/2019 Cancer Staging   Staging form: Plasma Cell Myeloma and Plasma Cell Disorders, AJCC 8th Edition - Clinical stage from 04/12/2019: Beta-2-microglobulin (mg/L): 2, Albumin (g/dL): 3.6, ISS: Stage I, High-risk cytogenetics: Unknown, LDH: Unknown - Signed by Heath Lark, MD on 04/12/2019   04/14/2019 Bone Marrow Biopsy   Bone Marrow, Aspirate,Biopsy, and Clot, right iliac bone - PLASMA CELL MYELOMA.   04/17/2019 - 07/11/2019 Chemotherapy   The patient had bortezemib, revlimid and dexamethasone for chemotherapy treatment.     08/17/2019 Bone Marrow Transplant   He received high dose melphalan followed by autologous stem cell transplant at [facility]   11/22/2019 Bone Marrow Biopsy   BONE MARROW biopsy [facility]:       Mild monoclonal plasmacytosis (approximately 2%), kappa light chain restricted.    11/22/2019 PET scan   PET CT at [facility] 1.  Similar size and appearance of mildly hypermetabolic lytic lesion in the L5 vertebral body and posterior elements.  2.  Otherwise, no substantial FDG uptake compared to background bone marrow in the additional osseous lucent lesions.    12/20/2019 - 05/09/2020 Radiation Therapy    Radiation Treatment Dates: 12/20/2019 through 01/08/2020 Site Technique Total Dose (Gy) Dose per Fx (Gy) Completed Fx Beam Energies  Lumbar Spine: Spine 3D 35/35 2.5 14/14 15X        02/29/2020 PET scan   1. Similar size and appearance of expansile lytic lesion in the L5 vertebral body and posterior elements with minimal FDG activity.  2. Otherwise, no additional osseous lucent lesions with FDG uptake above background bone marrow   05/31/2020 -  Chemotherapy   The patient had Revlimid for chemotherapy treatment.     11/25/2021 - 05/11/2022 Chemotherapy   Patient is on Treatment Plan : MYELOMA RELAPSED / REFRACTORY Daratumumab SQ + Bortezomib + Dexamethasone (DaraVd) q21d / Daratumumab SQ q28d      11/25/2021 - 10/29/2022 Chemotherapy   Patient is on Treatment Plan : MYELOMA RELAPSED / REFRACTORY Daratumumab SQ + Bortezomib + Dexamethasone (DaraVd) q21d / Daratumumab SQ q28d        PHYSICAL EXAMINATION: ECOG PERFORMANCE STATUS: 0 - Asymptomatic  Vitals:   11/10/22 1027  BP: (!) 144/83  Pulse: (!) 105  Resp: 15  Temp:  99 F (37.2 C)  SpO2: 98%   Filed Weights   11/10/22 1027  Weight: 251 lb 12.8 oz (114.2 kg)    GENERAL:alert, no distress and comfortable  NEURO: alert & oriented x 3 with fluent speech, no focal motor/sensory deficits  LABORATORY DATA:  I have reviewed the data as listed    Component Value Date/Time   NA 138 10/29/2022 1046   NA 139 10/11/2018 1115   NA 139 04/05/2014 0849   K 4.2 10/29/2022 1046   K 4.4 04/05/2014 0849   CL 105 10/29/2022 1046   CO2 29 10/29/2022 1046   CO2 23 04/05/2014 0849   GLUCOSE 60 (L) 10/29/2022 1046   GLUCOSE 109 04/05/2014 0849   BUN 29 (H) 10/29/2022 1046   BUN 23 10/11/2018 1115   BUN 16.8 04/05/2014 0849   CREATININE 0.80 10/29/2022 1046   CREATININE 0.91 07/29/2017 1004   CREATININE 0.9 04/05/2014 0849   CALCIUM 9.4 10/29/2022 1046   CALCIUM 8.7 04/05/2014 0849   PROT 6.5 10/29/2022 1046   PROT 7.2 10/11/2018  1115   PROT 6.7 04/05/2014 0849   ALBUMIN 3.9 10/29/2022 1046   ALBUMIN 4.7 10/11/2018 1115   ALBUMIN 3.8 04/05/2014 0849   AST 24 10/29/2022 1046   AST 26 04/05/2014 0849   ALT 31 10/29/2022 1046   ALT 30 04/05/2014 0849   ALKPHOS 68 10/29/2022 1046   ALKPHOS 75 04/05/2014 0849   BILITOT 0.9 10/29/2022 1046   BILITOT 0.71 04/05/2014 0849   GFRNONAA >60 10/29/2022 1046   GFRAA >60 06/14/2020 0830    No results found for: "SPEP", "UPEP"  Lab Results  Component Value Date   WBC 6.7 10/29/2022   NEUTROABS 3.1 10/29/2022   HGB 15.1 10/29/2022   HCT 42.6 10/29/2022   MCV 100.7 (H) 10/29/2022   PLT 167 10/29/2022      Chemistry      Component Value Date/Time   NA 138 10/29/2022 1046   NA 139 10/11/2018 1115   NA 139 04/05/2014 0849   K 4.2 10/29/2022 1046   K 4.4 04/05/2014 0849   CL 105 10/29/2022 1046   CO2 29 10/29/2022 1046   CO2 23 04/05/2014 0849   BUN 29 (H) 10/29/2022 1046   BUN 23 10/11/2018 1115   BUN 16.8 04/05/2014 0849   CREATININE 0.80 10/29/2022 1046   CREATININE 0.91 07/29/2017 1004   CREATININE 0.9 04/05/2014 0849   GLU 10 04/10/2014 0000      Component Value Date/Time   CALCIUM 9.4 10/29/2022 1046   CALCIUM 8.7 04/05/2014 0849   ALKPHOS 68 10/29/2022 1046   ALKPHOS 75 04/05/2014 0849   AST 24 10/29/2022 1046   AST 26 04/05/2014 0849   ALT 31 10/29/2022 1046   ALT 30 04/05/2014 0849   BILITOT 0.9 10/29/2022 1046   BILITOT 0.71 04/05/2014 0849

## 2022-11-10 NOTE — Telephone Encounter (Signed)
Oral Oncology Patient Advocate Encounter  Prior Authorization for Kennieth Rad has been approved.    PA# [id number] Effective dates: 11/10/22 through 11/10/23  Patients co-pay is $2,458.86.    Lady Deutscher, CPhT-Adv Oncology Pharmacy Patient [place] Direct Number: [phone]  Fax: [phone]

## 2022-11-10 NOTE — Telephone Encounter (Signed)
Oral Oncology Pharmacist Encounter  Received new prescription for ixazomib (Ninlaro) and pomalidomide (Pomalyst) for the treatment of relapsed multiple myeloma in conjunction with dexamethasone, planned duration until disease progression or unacceptable toxicity.  Labs from 10/29/22 assessed, no interventions needed. Ninlaro and Pomalyst prescription dose and frequency assessed for appropriateness.   Current medication list in Epic reviewed, no significant DDIs with Ninlaro and Pomalyst identified.   Evaluated chart and no patient barriers to medication adherence noted.   Patient agreement for treatment documented in MD note on 11/10/2022.  Prescription has been e-scribed to the [facility] for benefits analysis and approval.  Oral Oncology Clinic will continue to follow for insurance authorization, copayment issues, initial counseling and start date.  Drema Halon, PharmD Hematology/Oncology Clinical Pharmacist [facility] [phone] 11/10/2022 1:28 PM

## 2022-11-10 NOTE — Telephone Encounter (Signed)
Oral Oncology Patient Advocate Encounter  Prior Authorization for Pomalyst has been approved.    PA# [id number] Effective dates: 11/10/22 through 11/10/23  Patients co-pay is $2,458.86.    Lady Deutscher, CPhT-Adv Oncology Pharmacy Patient [place] Direct Number: [phone]  Fax: [phone]

## 2022-11-10 NOTE — Telephone Encounter (Signed)
Oral Oncology Patient Advocate Encounter   Received notification that prior authorization for Pomalyst is required.   PA submitted on 11/10/22 Key [id number] Status is pending     Lady Deutscher, CPhT-Adv Oncology Pharmacy Patient [place] Direct Number: [phone]  Fax: [phone]

## 2022-11-10 NOTE — Telephone Encounter (Signed)
Oral Oncology Patient Advocate Encounter   Received notification that prior authorization for Jesse Mcclure is required.   PA submitted on 11/10/22 Key [id number] Status is pending     Jesse Mcclure, CPhT-Adv Oncology Pharmacy Patient [place] Direct Number: [phone]  Fax: [phone]

## 2022-11-10 NOTE — Telephone Encounter (Signed)
Oral Oncology Patient Advocate Encounter  Was successful in securing patient a $12,000 grant from [facility] to provide copayment coverage for Ninlaro/Pomalyst.  This will keep the out of pocket expense at $0.     [employer] ID: [id number]  I have spoken with the patient.   The billing information is as follows and has been shared with [employer].    RxBin: [id number] PCN: [id number] Member ID: [id number] Group ID: [id number] Dates of Eligibility: 10/11/22 through 10/11/23  Fund:  [place], [place] Patient [place] Direct Number: [phone]  Fax: [phone]

## 2022-11-12 ENCOUNTER — Telehealth: Payer: Self-pay

## 2022-11-12 ENCOUNTER — Other Ambulatory Visit: Payer: Self-pay | Admitting: Hematology and Oncology

## 2022-11-12 DIAGNOSIS — G8929 Other chronic pain: Secondary | ICD-10-CM

## 2022-11-12 MED ORDER — OXYCODONE HCL 5 MG PO TABS: 5.0000 mg | ORAL_TABLET | Freq: Four times a day (QID) | ORAL | 0 refills | Status: DC | PRN

## 2022-11-12 NOTE — Telephone Encounter (Signed)
He called and left a message requesting refill on Oxycodone Rx. He is trying to exercise and having back pain.

## 2022-11-12 NOTE — Telephone Encounter (Signed)
Oral Chemotherapy Pharmacist Encounter  I spoke with patient for overview of: Pomalyst (pomalidomide) and Ninlaro (ixazomib) for the treatment of relapsed multiple myeloma in conjunction with dexamethasone, planned duration until disease progression or unacceptable toxicity.   Counseled patient on administration, dosing, side effects, monitoring, drug-food interactions, safe handling, storage, and disposal.  Patient will take Pomalyst 1m capsules, 1 capsule by mouth once daily, without regard to food, with a full glass of water.  Pomalyst will be given 21 days on, 7 days off, repeat every 28 days.  Patient will take Ninlaro 365mcapsules, 1 capsule by mouth once weekly. Take on an empty stomach, 1 hour before or 2 hours after a meal. Take on days 1, 8, and 15 of each 28 day cycle.  Patient will take dexamethasone 41m441mablets, 3 tablets (1m741my mouth once weekly with breakfast.  Pomalyst and Ninlaro start date: 11/17/2022  Adverse effects of Pomalyst include but are not limited to: nausea, constipation, diarrhea, abdominal pain, rash, fatigue, drug fever, peripheral edema, and decreased blood counts.  Adverse effects of Ninlaro include but are not limited to: peripheral edema, constipation, nausea, vomiting, decreased blood counts, and peripheral neuropathy.   Patient has anti-emetic on hand and knows to take it if nausea develops.     Reviewed with patient importance of keeping a medication schedule and plan for any missed doses. No barriers to medication adherence identified.  Medication reconciliation performed and medication/allergy list updated.   Confirmed with patient they have picked up acyclovir prescription and have started it. Patient on daily baby aspirin. Importance of both of these supportive care medications reviewed with patient.  Insurance authorization for [employer] Rad been obtained.  Pomalyst prescription is being dispensed from [facility] is a limited distribution medication.  Ninlaro prescription is being dispensed from [employer].  All questions answered.  Patient voiced understanding and appreciation.   Medication education handout placed in mail for patient. Patient knows to call the office with questions or concerns. Oral Chemotherapy Clinic phone number provided to patient.   KaitDrema HalonarmD Hematology/Oncology Clinical Pharmacist [phone]

## 2022-11-12 NOTE — Telephone Encounter (Signed)
Called back and instructed to do not start Pomalyst until 2/13. He verbalized understanding.

## 2022-11-12 NOTE — Telephone Encounter (Signed)
Done

## 2022-11-12 NOTE — Telephone Encounter (Signed)
Called and told Rx sent. He appreciated the call.  He said he received Pomalyst from [employer] and will start tomorrow. What date is he supposed to start? Just FYI

## 2022-11-20 ENCOUNTER — Telehealth: Payer: Self-pay

## 2022-11-20 NOTE — Telephone Encounter (Signed)
Called and given below message. He verbalized understanding and he will stop the pomalyst until he sees Dr. Alvy Bimler next week.

## 2022-11-20 NOTE — Telephone Encounter (Signed)
Returned his call. He started having scalp itching on the 2-3rd day after starting pomalyst, Denies rash. He applied cortisone cream to scalp last night to help, but is having a hard time sleeping. He is having bilateral hip pain and taking oxycodone to see if that helps. He took the Pomalyst last night but would like to stop the Pomalyst if possible.

## 2022-11-20 NOTE — Telephone Encounter (Signed)
If he does not want to take pomalyst, I will have to have another discussion next week when I see him Base on his symptoms, I do not see a reason to stop Pomalyst but the decision is up to him

## 2022-11-24 ENCOUNTER — Inpatient Hospital Stay: Payer: Medicare Other

## 2022-11-24 ENCOUNTER — Encounter: Payer: Self-pay | Admitting: Hematology and Oncology

## 2022-11-24 ENCOUNTER — Inpatient Hospital Stay (HOSPITAL_BASED_OUTPATIENT_CLINIC_OR_DEPARTMENT_OTHER): Payer: Medicare Other | Admitting: Hematology and Oncology

## 2022-11-24 VITALS — BP 141/99 | HR 95 | Temp 98.8°F | Resp 18 | Wt 249.6 lb

## 2022-11-24 DIAGNOSIS — Z79899 Other long term (current) drug therapy: Secondary | ICD-10-CM | POA: Diagnosis not present

## 2022-11-24 DIAGNOSIS — C9000 Multiple myeloma not having achieved remission: Secondary | ICD-10-CM

## 2022-11-24 DIAGNOSIS — I2510 Atherosclerotic heart disease of native coronary artery without angina pectoris: Secondary | ICD-10-CM | POA: Diagnosis not present

## 2022-11-24 DIAGNOSIS — I700 Atherosclerosis of aorta: Secondary | ICD-10-CM | POA: Diagnosis not present

## 2022-11-24 DIAGNOSIS — E669 Obesity, unspecified: Secondary | ICD-10-CM

## 2022-11-24 DIAGNOSIS — G629 Polyneuropathy, unspecified: Secondary | ICD-10-CM | POA: Diagnosis not present

## 2022-11-24 LAB — CBC WITH DIFFERENTIAL/PLATELET
Abs Immature Granulocytes: 0.01 10*3/uL (ref 0.00–0.07)
Basophils Absolute: 0 10*3/uL (ref 0.0–0.1)
Basophils Relative: 0 %
Eosinophils Absolute: 0.1 10*3/uL (ref 0.0–0.5)
Eosinophils Relative: 1 %
HCT: 46.3 % (ref 39.0–52.0)
Hemoglobin: 16 g/dL (ref 13.0–17.0)
Immature Granulocytes: 0 %
Lymphocytes Relative: 28 %
Lymphs Abs: 2.2 10*3/uL (ref 0.7–4.0)
MCH: 35 pg — ABNORMAL HIGH (ref 26.0–34.0)
MCHC: 34.6 g/dL (ref 30.0–36.0)
MCV: 101.3 fL — ABNORMAL HIGH (ref 80.0–100.0)
Monocytes Absolute: 0.4 10*3/uL (ref 0.1–1.0)
Monocytes Relative: 5 %
Neutro Abs: 5.1 10*3/uL (ref 1.7–7.7)
Neutrophils Relative %: 66 %
Platelets: 177 10*3/uL (ref 150–400)
RBC: 4.57 MIL/uL (ref 4.22–5.81)
RDW: 12.7 % (ref 11.5–15.5)
WBC: 7.9 10*3/uL (ref 4.0–10.5)
nRBC: 0 % (ref 0.0–0.2)

## 2022-11-24 LAB — COMPREHENSIVE METABOLIC PANEL
ALT: 32 U/L (ref 0–44)
AST: 22 U/L (ref 15–41)
Albumin: 4.2 g/dL (ref 3.5–5.0)
Alkaline Phosphatase: 83 U/L (ref 38–126)
Anion gap: 6 (ref 5–15)
BUN: 17 mg/dL (ref 8–23)
CO2: 28 mmol/L (ref 22–32)
Calcium: 9.1 mg/dL (ref 8.9–10.3)
Chloride: 105 mmol/L (ref 98–111)
Creatinine, Ser: 0.79 mg/dL (ref 0.61–1.24)
GFR, Estimated: >60 mL/min (ref >60)
Glucose, Bld: 64 mg/dL — ABNORMAL LOW (ref 70–99)
Potassium: 3.8 mmol/L (ref 3.5–5.1)
Sodium: 139 mmol/L (ref 135–145)
Total Bilirubin: 0.8 mg/dL (ref 0.3–1.2)
Total Protein: 6.8 g/dL (ref 6.5–8.1)

## 2022-11-24 NOTE — Assessment & Plan Note (Signed)
Overall, he tolerated treatment well except for recent scalp itching that has since resolved It is not associated with any skin rash I wonder if dry skin could be a contributing factor I recommend the patient to resume taking Pomalyst I plan to see him next month for further review and Zometa infusion

## 2022-11-24 NOTE — Progress Notes (Signed)
[facility] OFFICE PROGRESS NOTE  Patient Care Team: Denita Lung, MD as PCP - General (Family Medicine) Heath Lark, MD as Consulting Physician (Hematology and Oncology)  ASSESSMENT & PLAN:  Multiple myeloma without remission ([place]) Overall, he tolerated treatment well except for recent scalp itching that has since resolved It is not associated with any skin rash I wonder if dry skin could be a contributing factor I recommend the patient to resume taking Pomalyst I plan to see him next month for further review and Zometa infusion  Obesity (BMI 30-39.9) Previously, we discussed the importance of weight loss He is highly motivated and has lost some weight since last time I saw him I encourage the patient to continue his lifestyle changes  No orders of the defined types were placed in this encounter.   All questions were answered. The patient knows to call the clinic with any problems, questions or concerns. The total time spent in the appointment was 20 minutes encounter with patients including review of chart and various tests results, discussions about plan of care and coordination of care plan   Heath Lark, MD 11/24/2022 4:15 PM  INTERVAL HISTORY: Please see below for problem oriented charting. he returns for treatment follow-up on combination chemotherapy with Pomalyst and Nin[place]o His stop Pomalyst over the weekend because of some itching on his scalp without associated skin rash His symptoms has resolved He is highly motivated with weight loss strategies and had been successful He modified his diet extensively and started some exercise  REVIEW OF SYSTEMS:   Constitutional: Denies fevers, chills or abnormal weight loss Eyes: Denies blurriness of vision Ears, nose, mouth, throat, and face: Denies mucositis or sore throat Respiratory: Denies cough, dyspnea or wheezes Cardiovascular: Denies palpitation, chest discomfort or lower extremity  swelling Gastrointestinal:  Denies nausea, heartburn or change in bowel habits Skin: Denies abnormal skin rashes Lymphatics: Denies new lymphadenopathy or easy bruising Neurological:Denies numbness, tingling or new weaknesses Behavioral/Psych: Mood is stable, no new changes  All other systems were reviewed with the patient and are negative.  I have reviewed the past medical history, past surgical history, social history and family history with the patient and they are unchanged from previous note.  ALLERGIES:  has No Known Allergies.  MEDICATIONS:  Current Outpatient Medications  Medication Sig Dispense Refill   acetaminophen (TYLENOL) 650 MG CR tablet Take 650 mg by mouth every 8 (eight) hours as needed for pain.     acyclovir (ZOVIRAX) 400 MG tablet Take 1 tablet (400 mg total) by mouth daily. 90 tablet 1   atorvastatin (LIPITOR) 20 MG tablet TAKE 1 TABLET(20 MG) BY MOUTH DAILY 90 tablet 1   calcium carbonate (TUMS - DOSED IN MG ELEMENTAL CALCIUM) 500 MG chewable tablet Chew 1-2 tablets by mouth daily as needed for heartburn.     Cholecalciferol (VITAMIN D) 50 MCG (2000 UT) tablet Take 2,000 Units by mouth daily.     dexamethasone (DECADRON) 4 MG tablet 3 tabs weekly 12 tablet 6   folic acid (FOLVITE) A999333 MCG tablet Take 400 mcg by mouth daily.     ixazomib citrate (NINLARO) 3 MG capsule Take 1 capsule (3 mg) by mouth weekly, 3 weeks on, 1 week off, repeat every 4 weeks. Take on an empty stomach 1hr before or 2hr after meals. 3 capsule 0   losartan-hydrochlorothiazide (HYZAAR) 100-12.5 MG tablet TAKE 1 TABLET BY MOUTH DAILY 90 tablet 1   Multiple Vitamin (MULTI-VITAMIN) tablet Take 1 tablet by mouth  daily.     ondansetron (ZOFRAN) 8 MG tablet Take 1 tablet (8 mg total) by mouth 2 (two) times daily as needed (Nausea or vomiting). 30 tablet 1   oxyCODONE (ROXICODONE) 5 MG immediate release tablet Take 1 tablet (5 mg total) by mouth every 6 (six) hours as needed for severe pain. 90 tablet 0    Polyethyl Glycol-Propyl Glycol (SYSTANE OP) Place 1 drop into both eyes daily as needed (dry eyes).     pomalidomide (POMALYST) 3 MG capsule Take 1 capsule (75m) by mouth daily for 21 days, rest 7 days, repeat every 28 days. 21 capsule 0   prochlorperazine (COMPAZINE) 10 MG tablet TAKE 1 TABLET(10 MG) BY MOUTH EVERY 6 HOURS AS NEEDED FOR NAUSEA OR VOMITING 30 tablet 1   rivaroxaban (XARELTO) 20 MG TABS tablet Take 1 tablet (20 mg total) by mouth daily with supper. 90 tablet 1   vitamin B-12 (CYANOCOBALAMIN) 1000 MCG tablet Take 1,000 mcg by mouth daily.     No current facility-administered medications for this visit.    SUMMARY OF ONCOLOGIC HISTORY: Oncology History  [place] myel[place] without remission ([place]  03/20/2019 Imaging   1. Tumor involving the L5 and S1 and S2 segments of the spine as described with mass effects upon the left L4, L5, S1 and more distal left sacral nerves as described above. 2. Does the patient have a history of malignancy? 3. Multiple plasmacytomas and metastatic disease could give this appearance   03/27/2019 Pathology Results   Soft Tissue Needle Core Biopsy, sacral mass - PLASMABLASTIC NEOPLASM. - SEE COMMENT. Microscopic Comment The sections show needle core biopsy fragments of soft tissue densely infiltrated by a relatively monomorphic infiltrate of atypical plasmacytoid cells characterized by vesicular or partially clumped chromatin and prominent nucleoli. This is associated with scattered mitosis. A battery of immunohistochemical stains was performed and show that the atypical plasmacytoid cells are positive for CD138, CD43 and cytoplasmic kappa. There is also weak positivity for CD56 and partial variable positivity for cyclin D1. The atypical plasmacytoid cells are negative for cytoplasmic lambda, CD10, PAX5, CD79a, CD20, CD3, CD5, CD34, EBV (ISH) and mostly negative for LCA. The overall morphologic and histologic features are most compatible with a  plasmablastic neoplasm. The differential diagnosis includes plasmablastic lymphoma and plasmablastic plasmacytoma/myeloma. Based on the overall phenotypic features and the clinical setting, plasmacytoma/myeloma is favored. Clinical correlation and hematologic evaluation is recommended   03/27/2019 Procedure   Technically successful CT-guided biopsy of sacral soft tissue mass.   04/04/2019 Initial Diagnosis   Multiple myeloma without remission ([facility]   04/11/2019 PET scan   IMPRESSION: 1. Previously noted expansile lesions involving the L5 vertebra and sacrum are again noted and exhibit intense FDG uptake compatible with metabolically active disease. A third focus of increased uptake without corresponding CT abnormality is noted within the intertrochanteric portions of the proximal right femur. 2. Small lucent lesion within the T3 vertebra is noted without corresponding FDG uptake. 3. Asymmetric left bladder wall thickening, etiology indeterminate. 4. Aortic Atherosclerosis (ICD10-I70.0). Lad coronary artery calcification.   04/12/2019 Cancer Staging   Staging form: Plasma Cell Myeloma and Plasma Cell Disorders, AJCC 8th Edition - Clinical stage from 04/12/2019: Beta-2-microglobulin (mg/L): 2, Albumin (g/dL): 3.6, ISS: Stage I, High-risk cytogenetics: Unknown, LDH: Unknown - Signed by GHeath Lark MD on 04/12/2019   04/14/2019 Bone Marrow Biopsy   Bone Marrow, Aspirate,Biopsy, and Clot, right iliac bone - PLASMA CELL MYELOMA.   04/17/2019 - 07/11/2019 Chemotherapy   The patient had bortezemib,  revlimid and dexamethasone for chemotherapy treatment.     08/17/2019 Bone Marrow Transplant   He received high dose melphalan followed by autologous stem cell [facility] at [facility]   11/22/2019 Bone Marrow Biopsy   BONE MARROW biopsy at [facility]:       Mild monoclonal plasmacytosis (approximately 2%), kappa light chain restricted.    11/22/2019 PET scan   PET CT at [facility] 1.  Similar size and  appearance of mildly hypermetabolic lytic lesion in the L5 vertebral body and posterior elements.  2.  Otherwise, no substantial FDG uptake compared to background bone marrow in the additional osseous lucent lesions.    12/20/2019 - 05/09/2020 Radiation Therapy   Radiation Treatment Dates: 12/20/2019 through 01/08/2020 Site Technique Total Dose (Gy) Dose per Fx (Gy) Completed Fx Beam Energies  Lumbar Spine: Spine 3D 35/35 2.5 14/14 15X        02/29/2020 PET scan   1. Similar size and appearance of expansile lytic lesion in the L5 vertebral body and posterior elements with minimal FDG activity.  2. Otherwise, no additional osseous lucent lesions with FDG uptake above background bone marrow   05/31/2020 -  Chemotherapy   The patient had Revlimid for chemotherapy treatment.     11/25/2021 - 05/11/2022 Chemotherapy   Patient is on Treatment Plan : MYELOMA RELAPSED / REFRACTORY Daratumumab SQ + Bortezomib + Dexamethasone (DaraVd) q21d / Daratumumab SQ q28d      11/25/2021 - 10/29/2022 Chemotherapy   Patient is on Treatment Plan : MYELOMA RELAPSED / REFRACTORY Daratumumab SQ + Bortezomib + Dexamethasone (DaraVd) q21d / Daratumumab SQ q28d        PHYSICAL EXAMINATION: ECOG PERFORMANCE STATUS: 0 - Asymptomatic  Vitals:   11/24/22 1153  BP: (!) 141/99  Pulse: 95  Resp: 18  Temp: 98.8 F (37.1 C)  SpO2: 97%   Filed Weights   11/24/22 1153  Weight: 249 lb 9.6 oz (113.2 kg)    GENERAL:alert, no distress and comfortable SKIN: skin color, texture, turgor are normal, no rashes or significant lesions EYES: normal, Conjunctiva are pink and non-injected, sclera clear OROPHARYNX:no exudate, no erythema and lips, buccal mucosa, and tongue normal  NECK: supple, thyroid normal size, non-tender, without nodularity LYMPH:  no palpable lymphadenopathy in the cervical, axillary or inguinal LUNGS: clear to auscultation and percussion with normal breathing effort HEART: regular rate & rhythm and no murmurs  and no lower extremity edema ABDOMEN:abdomen soft, non-tender and normal bowel sounds Musculoskeletal:no cyanosis of digits and no clubbing  NEURO: alert & oriented x 3 with fluent speech, no focal motor/sensory deficits  LABORATORY DATA:  I have reviewed the data as listed    Component Value Date/Time   NA 139 11/24/2022 1133   NA 139 10/11/2018 1115   NA 139 04/05/2014 0849   K 3.8 11/24/2022 1133   K 4.4 04/05/2014 0849   CL 105 11/24/2022 1133   CO2 28 11/24/2022 1133   CO2 23 04/05/2014 0849   GLUCOSE 64 (L) 11/24/2022 1133   GLUCOSE 109 04/05/2014 0849   BUN 17 11/24/2022 1133   BUN 23 10/11/2018 1115   BUN 16.8 04/05/2014 0849   CREATININE 0.79 11/24/2022 1133   CREATININE 0.80 10/29/2022 1046   CREATININE 0.91 07/29/2017 1004   CREATININE 0.9 04/05/2014 0849   CALCIUM 9.1 11/24/2022 1133   CALCIUM 8.7 04/05/2014 0849   PROT 6.8 11/24/2022 1133   PROT 7.2 10/11/2018 1115   PROT 6.7 04/05/2014 0849   ALBUMIN 4.2 11/24/2022  1133   ALBUMIN 4.7 10/11/2018 1115   ALBUMIN 3.8 04/05/2014 0849   AST 22 11/24/2022 1133   AST 24 10/29/2022 1046   AST 26 04/05/2014 0849   ALT 32 11/24/2022 1133   ALT 31 10/29/2022 1046   ALT 30 04/05/2014 0849   ALKPHOS 83 11/24/2022 1133   ALKPHOS 75 04/05/2014 0849   BILITOT 0.8 11/24/2022 1133   BILITOT 0.9 10/29/2022 1046   BILITOT 0.71 04/05/2014 0849   GFRNONAA >60 11/24/2022 1133   GFRNONAA >60 10/29/2022 1046   GFRAA >60 06/14/2020 0830    No results found for: "SPEP", "UPEP"  Lab Results  Component Value Date   WBC 7.9 11/24/2022   NEUTROABS 5.1 11/24/2022   HGB 16.0 11/24/2022   HCT 46.3 11/24/2022   MCV 101.3 (H) 11/24/2022   PLT 177 11/24/2022      Chemistry      Component Value Date/Time   NA 139 11/24/2022 1133   NA 139 10/11/2018 1115   NA 139 04/05/2014 0849   K 3.8 11/24/2022 1133   K 4.4 04/05/2014 0849   CL 105 11/24/2022 1133   CO2 28 11/24/2022 1133   CO2 23 04/05/2014 0849   BUN 17 11/24/2022  1133   BUN 23 10/11/2018 1115   BUN 16.8 04/05/2014 0849   CREATININE 0.79 11/24/2022 1133   CREATININE 0.80 10/29/2022 1046   CREATININE 0.91 07/29/2017 1004   CREATININE 0.9 04/05/2014 0849   GLU 10 04/10/2014 0000      Component Value Date/Time   CALCIUM 9.1 11/24/2022 1133   CALCIUM 8.7 04/05/2014 0849   ALKPHOS 83 11/24/2022 1133   ALKPHOS 75 04/05/2014 0849   AST 22 11/24/2022 1133   AST 24 10/29/2022 1046   AST 26 04/05/2014 0849   ALT 32 11/24/2022 1133   ALT 31 10/29/2022 1046   ALT 30 04/05/2014 0849   BILITOT 0.8 11/24/2022 1133   BILITOT 0.9 10/29/2022 1046   BILITOT 0.71 04/05/2014 0849

## 2022-11-24 NOTE — Assessment & Plan Note (Signed)
Previously, we discussed the importance of weight loss He is highly motivated and has lost some weight since last time I saw him I encourage the patient to continue his lifestyle changes

## 2022-11-25 ENCOUNTER — Telehealth: Payer: Self-pay | Admitting: Hematology and Oncology

## 2022-11-25 LAB — KAPPA/LAMBDA LIGHT CHAINS
Kappa free light chain: 20.1 mg/L — ABNORMAL HIGH (ref 3.3–19.4)
Kappa, lambda light chain ratio: 2.83 — ABNORMAL HIGH (ref 0.26–1.65)
Lambda free light chains: 7.1 mg/L (ref 5.7–26.3)

## 2022-11-25 NOTE — Telephone Encounter (Signed)
Spoke with patient confirming upcoming appointments  

## 2022-11-30 LAB — MULTIPLE MYELOMA PANEL, SERUM
Albumin SerPl Elph-Mcnc: 3.7 g/dL (ref 2.9–4.4)
Albumin/Glob SerPl: 1.5 (ref 0.7–1.7)
Alpha 1: 0.3 g/dL (ref 0.0–0.4)
Alpha2 Glob SerPl Elph-Mcnc: 0.8 g/dL (ref 0.4–1.0)
B-Globulin SerPl Elph-Mcnc: 0.8 g/dL (ref 0.7–1.3)
Gamma Glob SerPl Elph-Mcnc: 0.7 g/dL (ref 0.4–1.8)
Globulin, Total: 2.6 g/dL (ref 2.2–3.9)
IgA: 21 mg/dL — ABNORMAL LOW (ref 61–437)
IgG (Immunoglobin G), Serum: 817 mg/dL (ref 603–1613)
IgM (Immunoglobulin M), Srm: 16 mg/dL (ref 15–143)
M Protein SerPl Elph-Mcnc: 0.5 g/dL — ABNORMAL HIGH
Total Protein ELP: 6.3 g/dL (ref 6.0–8.5)

## 2022-12-01 DIAGNOSIS — Z961 Presence of intraocular lens: Secondary | ICD-10-CM | POA: Diagnosis not present

## 2022-12-01 DIAGNOSIS — H35371 Puckering of macula, right eye: Secondary | ICD-10-CM | POA: Diagnosis not present

## 2022-12-02 ENCOUNTER — Other Ambulatory Visit: Payer: Self-pay

## 2022-12-02 DIAGNOSIS — C9000 Multiple myeloma not having achieved remission: Secondary | ICD-10-CM

## 2022-12-02 MED ORDER — IXAZOMIB CITRATE 3 MG PO CAPS: ORAL_CAPSULE | ORAL | 0 refills | Status: DC

## 2022-12-02 MED ORDER — POMALIDOMIDE 3 MG PO CAPS: ORAL_CAPSULE | ORAL | 0 refills | Status: DC

## 2022-12-18 ENCOUNTER — Inpatient Hospital Stay: Payer: Medicare Other | Attending: Hematology and Oncology

## 2022-12-18 ENCOUNTER — Other Ambulatory Visit: Payer: Self-pay

## 2022-12-18 ENCOUNTER — Inpatient Hospital Stay: Payer: Medicare Other

## 2022-12-18 ENCOUNTER — Inpatient Hospital Stay (HOSPITAL_BASED_OUTPATIENT_CLINIC_OR_DEPARTMENT_OTHER): Payer: Medicare Other | Admitting: Hematology and Oncology

## 2022-12-18 VITALS — BP 107/72 | HR 67 | Resp 18

## 2022-12-18 VITALS — BP 105/64 | HR 61 | Temp 98.0°F | Resp 18 | Ht 68.5 in | Wt 248.8 lb

## 2022-12-18 DIAGNOSIS — C9000 Multiple myeloma not having achieved remission: Secondary | ICD-10-CM | POA: Diagnosis not present

## 2022-12-18 LAB — CBC WITH DIFFERENTIAL/PLATELET
Abs Immature Granulocytes: 0.01 10*3/uL (ref 0.00–0.07)
Basophils Absolute: 0.1 10*3/uL (ref 0.0–0.1)
Basophils Relative: 2 %
Eosinophils Absolute: 0.1 10*3/uL (ref 0.0–0.5)
Eosinophils Relative: 3 %
HCT: 42.9 % (ref 39.0–52.0)
Hemoglobin: 14.7 g/dL (ref 13.0–17.0)
Immature Granulocytes: 0 %
Lymphocytes Relative: 44 %
Lymphs Abs: 2 10*3/uL (ref 0.7–4.0)
MCH: 34.8 pg — ABNORMAL HIGH (ref 26.0–34.0)
MCHC: 34.3 g/dL (ref 30.0–36.0)
MCV: 101.7 fL — ABNORMAL HIGH (ref 80.0–100.0)
Monocytes Absolute: 1.2 10*3/uL — ABNORMAL HIGH (ref 0.1–1.0)
Monocytes Relative: 27 %
Neutro Abs: 1.1 10*3/uL — ABNORMAL LOW (ref 1.7–7.7)
Neutrophils Relative %: 24 %
Platelets: 204 10*3/uL (ref 150–400)
RBC: 4.22 MIL/uL (ref 4.22–5.81)
RDW: 13.2 % (ref 11.5–15.5)
WBC: 4.5 10*3/uL (ref 4.0–10.5)
nRBC: 0 % (ref 0.0–0.2)

## 2022-12-18 LAB — COMPREHENSIVE METABOLIC PANEL
ALT: 32 U/L (ref 0–44)
AST: 17 U/L (ref 15–41)
Albumin: 3.8 g/dL (ref 3.5–5.0)
Alkaline Phosphatase: 74 U/L (ref 38–126)
Anion gap: 5 (ref 5–15)
BUN: 28 mg/dL — ABNORMAL HIGH (ref 8–23)
CO2: 28 mmol/L (ref 22–32)
Calcium: 8.6 mg/dL — ABNORMAL LOW (ref 8.9–10.3)
Chloride: 105 mmol/L (ref 98–111)
Creatinine, Ser: 0.76 mg/dL (ref 0.61–1.24)
GFR, Estimated: >60 mL/min (ref >60)
Glucose, Bld: 126 mg/dL — ABNORMAL HIGH (ref 70–99)
Potassium: 3.8 mmol/L (ref 3.5–5.1)
Sodium: 138 mmol/L (ref 135–145)
Total Bilirubin: 0.7 mg/dL (ref 0.3–1.2)
Total Protein: 6.2 g/dL — ABNORMAL LOW (ref 6.5–8.1)

## 2022-12-18 MED ORDER — SODIUM CHLORIDE 0.9 % IV SOLN: Freq: Once | INTRAVENOUS | Status: AC

## 2022-12-18 MED ORDER — ZOLEDRONIC ACID 4 MG/100ML IV SOLN
4.0000 mg | Freq: Once | INTRAVENOUS | Status: AC
  Administered 2022-12-18: 4 mg via INTRAVENOUS
  Filled 2022-12-18: qty 100

## 2022-12-18 NOTE — Assessment & Plan Note (Signed)
Overall, he tolerated treatment well except for recent scalp itching that has since resolved Repeat myeloma panel is pending I will call him with test results He will continue current treatment without changes We discussed the risk and benefits of a Zometa infusion and he is in agreement to proceed

## 2022-12-18 NOTE — Patient Instructions (Signed)

## 2022-12-18 NOTE — Assessment & Plan Note (Signed)
He had recent dental evaluation and was cleared to proceed He has mild hypocalcemia Will proceed with treatment with additional oral calcium supplement at home He will continue calcium with vitamin D

## 2022-12-18 NOTE — Progress Notes (Signed)
Per Dr. Alvy Bimler OK to proceed w/ Zometa today Pt saw dentist week of 12/07/2022. Per the Pt dentist cleared him to receive Zometa infusion. Per Dr. Alvy Bimler OK to proceed w/ Zometa today with Calcium of 8.6 mg/dL and Corrected Calcium of 8.76 mg/dL. Dr. Alvy Bimler recommended Pt to "take extra Tums daily for the next 7 days". This RN educated Pt on the MD's recommendation. Pt verbalized understanding and was agreeable.

## 2022-12-18 NOTE — Progress Notes (Unsigned)
[place] OFFICE PROGRESS NOTE  Patient Care Team: Denita Lung, MD as PCP - General (Family Medicine) Heath Lark, MD as Consulting Physician (Hematology and Oncology)  ASSESSMENT & PLAN:  Multiple myeloma without remission ([place]) Overall, he tolerated treatment well except for recent scalp itching that has since resolved Repeat myeloma panel is pending I will call him with test results He will continue current treatment without changes We discussed the risk and benefits of a Zometa infusion and he is in agreement to proceed  Hypocalcemia He had recent dental evaluation and was cleared to proceed He has mild hypocalcemia Will proceed with treatment with additional oral calcium supplement at home He will continue calcium with vitamin D  No orders of the defined types were placed in this encounter.   All questions were answered. The patient knows to call the clinic with any problems, questions or concerns. The total time spent in the appointment was {CHL ONC TIME VISIT - [id number] encounter with patients including review of chart and various tests results, discussions about plan of care and coordination of care plan   Heath Lark, MD 12/18/2022 5:56 PM  INTERVAL HISTORY: Please see below for problem oriented charting. he returns for treatment follow-up on treatment for multiple myeloma He had recent jaw sensitivity and had extensive dental evaluation that came back unremarkable He takes oxycodone rarely for joint pain He denies side effects from treatment His recent scalp itching has resolved He is working hard to lose weight  REVIEW OF SYSTEMS:   Constitutional: Denies fevers, chills or abnormal weight loss Eyes: Denies blurriness of vision Ears, nose, mouth, throat, and face: Denies mucositis or sore throat Respiratory: Denies cough, dyspnea or wheezes Cardiovascular: Denies palpitation, chest discomfort or lower extremity  swelling Gastrointestinal:  Denies nausea, heartburn or change in bowel habits Skin: Denies abnormal skin rashes Lymphatics: Denies new lymphadenopathy or easy bruising Neurological:Denies numbness, tingling or new weaknesses Behavioral/Psych: Mood is stable, no new changes  All other systems were reviewed with the patient and are negative.  I have reviewed the past medical history, past surgical history, social history and family history with the patient and they are unchanged from previous note.  ALLERGIES:  has No Known Allergies.  MEDICATIONS:  Current Outpatient Medications  Medication Sig Dispense Refill   acetaminophen (TYLENOL) 650 MG CR tablet Take 650 mg by mouth every 8 (eight) hours as needed for pain.     acyclovir (ZOVIRAX) 400 MG tablet Take 1 tablet (400 mg total) by mouth daily. 90 tablet 1   atorvastatin (LIPITOR) 20 MG tablet TAKE 1 TABLET(20 MG) BY MOUTH DAILY 90 tablet 1   calcium carbonate (TUMS - DOSED IN MG ELEMENTAL CALCIUM) 500 MG chewable tablet Chew 1-2 tablets by mouth daily as needed for heartburn.     Cholecalciferol (VITAMIN D) 50 MCG (2000 UT) tablet Take 2,000 Units by mouth daily.     dexamethasone (DECADRON) 4 MG tablet 3 tabs weekly 12 tablet 6   folic acid (FOLVITE) A999333 MCG tablet Take 400 mcg by mouth daily.     ixazomib citrate (NINLARO) 3 MG capsule Take 1 capsule (3 mg) by mouth weekly, 3 weeks on, 1 week off, repeat every 4 weeks. Take on an empty stomach 1hr before or 2hr after meals. 3 capsule 0   losartan-hydrochlorothiazide (HYZAAR) 100-12.5 MG tablet TAKE 1 TABLET BY MOUTH DAILY 90 tablet 1   Multiple Vitamin (MULTI-VITAMIN) tablet Take 1 tablet by mouth daily.  ondansetron (ZOFRAN) 8 MG tablet Take 1 tablet (8 mg total) by mouth 2 (two) times daily as needed (Nausea or vomiting). 30 tablet 1   oxyCODONE (ROXICODONE) 5 MG immediate release tablet Take 1 tablet (5 mg total) by mouth every 6 (six) hours as needed for severe pain. 90 tablet 0    Polyethyl Glycol-Propyl Glycol (SYSTANE OP) Place 1 drop into both eyes daily as needed (dry eyes).     pomalidomide (POMALYST) 3 MG capsule Take 1 capsule (3mg ) by mouth daily for 21 days, rest 7 days, repeat every 28 days. 21 capsule 0   prochlorperazine (COMPAZINE) 10 MG tablet TAKE 1 TABLET(10 MG) BY MOUTH EVERY 6 HOURS AS NEEDED FOR NAUSEA OR VOMITING 30 tablet 1   rivaroxaban (XARELTO) 20 MG TABS tablet Take 1 tablet (20 mg total) by mouth daily with supper. 90 tablet 1   vitamin B-12 (CYANOCOBALAMIN) 1000 MCG tablet Take 1,000 mcg by mouth daily.     No current facility-administered medications for this visit.    SUMMARY OF ONCOLOGIC HISTORY: Oncology History  Multiple myeloma without remission ([place])  03/20/2019 Imaging   1. Tumor involving the L5 and S1 and S2 segments of the spine as described with mass effects upon the left L4, L5, S1 and more distal left sacral nerves as described above. 2. Does the patient have a history of malignancy? 3. Multiple plasmacytomas and metastatic disease could give this appearance   03/27/2019 Pathology Results   Soft Tissue Needle Core Biopsy, sacral mass - PLASMABLASTIC NEOPLASM. - SEE COMMENT. Microscopic Comment The sections show needle core biopsy fragments of soft tissue densely infiltrated by a relatively monomorphic infiltrate of atypical plasmacytoid cells characterized by vesicular or partially clumped chromatin and prominent nucleoli. This is associated with scattered mitosis. A battery of immunohistochemical stains was performed and show that the atypical plasmacytoid cells are positive for CD138, CD43 and cytoplasmic kappa. There is also weak positivity for CD56 and partial variable positivity for cyclin D1. The atypical plasmacytoid cells are negative for cytoplasmic lambda, CD10, PAX5, CD79a, CD20, CD3, CD5, CD34, EBV (ISH) and mostly negative for LCA. The overall morphologic and histologic features are most compatible with a  plasmablastic neoplasm. The differential diagnosis includes plasmablastic lymphoma and plasmablastic plasmacytoma/myeloma. Based on the overall phenotypic features and the clinical setting, plasmacytoma/myeloma is favored. Clinical correlation and hematologic evaluation is recommended   03/27/2019 Procedure   Technically successful CT-guided biopsy of sacral soft tissue mass.   04/04/2019 Initial Diagnosis   Multiple myeloma without remission ([place])   04/11/2019 PET scan   IMPRESSION: 1. Previously noted expansile lesions involving the L5 vertebra and sacrum are again noted and exhibit intense FDG uptake compatible with metabolically active disease. A third focus of increased uptake without corresponding CT abnormality is noted within the intertrochanteric portions of the proximal right femur. 2. Small lucent lesion within the T3 vertebra is noted without corresponding FDG uptake. 3. Asymmetric left bladder wall thickening, etiology indeterminate. 4. Aortic Atherosclerosis (ICD10-I70.0). Lad coronary artery calcification.   04/12/2019 Cancer Staging   Staging form: Plasma Cell Myeloma and Plasma Cell Disorders, AJCC 8th Edition - Clinical stage from 04/12/2019: Beta-2-microglobulin (mg/L): 2, Albumin (g/dL): 3.6, ISS: Stage I, High-risk cytogenetics: Unknown, LDH: Unknown - Signed by Heath Lark, MD on 04/12/2019   04/14/2019 Bone Marrow Biopsy   Bone Marrow, Aspirate,Biopsy, and Clot, right iliac bone - PLASMA CELL MYELOMA.   04/17/2019 - 07/11/2019 Chemotherapy   The patient had bortezemib, revlimid and dexamethasone for chemotherapy  treatment.     08/17/2019 Bone Marrow Transplant   He received high dose melphalan followed by autologous stem cell transplant at [facility]   11/22/2019 Bone Marrow Biopsy   BONE MARROW biopsy at [facility]:       Mild monoclonal plasmacytosis (approximately 2%), kappa light chain restricted.    11/22/2019 PET scan   PET CT at [facility] 1.  Similar size and  appearance of mildly hypermetabolic lytic lesion in the L5 vertebral body and posterior elements.  2.  Otherwise, no substantial FDG uptake compared to background bone marrow in the additional osseous lucent lesions.    12/20/2019 - 05/09/2020 Radiation Therapy   Radiation Treatment Dates: 12/20/2019 through 01/08/2020 Site Technique Total Dose (Gy) Dose per Fx (Gy) Completed Fx Beam Energies  Lumbar Spine: Spine 3D 35/35 2.5 14/14 15X        02/29/2020 PET scan   1. Similar size and appearance of expansile lytic lesion in the L5 vertebral body and posterior elements with minimal FDG activity.  2. Otherwise, no additional osseous lucent lesions with FDG uptake above background bone marrow   05/31/2020 -  Chemotherapy   The patient had Revlimid for chemotherapy treatment.     11/25/2021 - 05/11/2022 Chemotherapy   Patient is on Treatment Plan : MYELOMA RELAPSED / REFRACTORY Daratumumab SQ + Bortezomib + Dexamethasone (DaraVd) q21d / Daratumumab SQ q28d      11/25/2021 - 10/29/2022 Chemotherapy   Patient is on Treatment Plan : MYELOMA RELAPSED / REFRACTORY Daratumumab SQ + Bortezomib + Dexamethasone (DaraVd) q21d / Daratumumab SQ q28d        PHYSICAL EXAMINATION: ECOG PERFORMANCE STATUS: {CHL ONC ECOG [id number]}  Vitals:   12/18/22 1020  BP: 105/64  Pulse: 61  Resp: 18  Temp: 98 F (36.7 C)  SpO2: 99%   Filed Weights   12/18/22 1020  Weight: 248 lb 12.8 oz (112.9 kg)    GENERAL:alert, no distress and comfortable SKIN: skin color, texture, turgor are normal, no rashes or significant lesions EYES: normal, Conjunctiva are pink and non-injected, sclera clear OROPHARYNX:no exudate, no erythema and lips, buccal mucosa, and tongue normal  NECK: supple, thyroid normal size, non-tender, without nodularity LYMPH:  no palpable lymphadenopathy in the cervical, axillary or inguinal LUNGS: clear to auscultation and percussion with normal breathing effort HEART: regular rate & rhythm and no  murmurs and no lower extremity edema ABDOMEN:abdomen soft, non-tender and normal bowel sounds Musculoskeletal:no cyanosis of digits and no clubbing  NEURO: alert & oriented x 3 with fluent speech, no focal motor/sensory deficits  LABORATORY DATA:  I have reviewed the data as listed    Component Value Date/Time   NA 138 12/18/2022 0949   NA 139 10/11/2018 1115   NA 139 04/05/2014 0849   K 3.8 12/18/2022 0949   K 4.4 04/05/2014 0849   CL 105 12/18/2022 0949   CO2 28 12/18/2022 0949   CO2 23 04/05/2014 0849   GLUCOSE 126 (H) 12/18/2022 0949   GLUCOSE 109 04/05/2014 0849   BUN 28 (H) 12/18/2022 0949   BUN 23 10/11/2018 1115   BUN 16.8 04/05/2014 0849   CREATININE 0.76 12/18/2022 0949   CREATININE 0.80 10/29/2022 1046   CREATININE 0.91 07/29/2017 1004   CREATININE 0.9 04/05/2014 0849   CALCIUM 8.6 (L) 12/18/2022 0949   CALCIUM 8.7 04/05/2014 0849   PROT 6.2 (L) 12/18/2022 0949   PROT 7.2 10/11/2018 1115   PROT 6.7 04/05/2014 0849   ALBUMIN 3.8 12/18/2022 0949  ALBUMIN 4.7 10/11/2018 1115   ALBUMIN 3.8 04/05/2014 0849   AST 17 12/18/2022 0949   AST 24 10/29/2022 1046   AST 26 04/05/2014 0849   ALT 32 12/18/2022 0949   ALT 31 10/29/2022 1046   ALT 30 04/05/2014 0849   ALKPHOS 74 12/18/2022 0949   ALKPHOS 75 04/05/2014 0849   BILITOT 0.7 12/18/2022 0949   BILITOT 0.9 10/29/2022 1046   BILITOT 0.71 04/05/2014 0849   GFRNONAA >60 12/18/2022 0949   GFRNONAA >60 10/29/2022 1046   GFRAA >60 06/14/2020 0830    No results found for: "SPEP", "UPEP"  Lab Results  Component Value Date   WBC 4.5 12/18/2022   NEUTROABS 1.1 (L) 12/18/2022   HGB 14.7 12/18/2022   HCT 42.9 12/18/2022   MCV 101.7 (H) 12/18/2022   PLT 204 12/18/2022      Chemistry      Component Value Date/Time   NA 138 12/18/2022 0949   NA 139 10/11/2018 1115   NA 139 04/05/2014 0849   K 3.8 12/18/2022 0949   K 4.4 04/05/2014 0849   CL 105 12/18/2022 0949   CO2 28 12/18/2022 0949   CO2 23 04/05/2014  0849   BUN 28 (H) 12/18/2022 0949   BUN 23 10/11/2018 1115   BUN 16.8 04/05/2014 0849   CREATININE 0.76 12/18/2022 0949   CREATININE 0.80 10/29/2022 1046   CREATININE 0.91 07/29/2017 1004   CREATININE 0.9 04/05/2014 0849   GLU 10 04/10/2014 0000      Component Value Date/Time   CALCIUM 8.6 (L) 12/18/2022 0949   CALCIUM 8.7 04/05/2014 0849   ALKPHOS 74 12/18/2022 0949   ALKPHOS 75 04/05/2014 0849   AST 17 12/18/2022 0949   AST 24 10/29/2022 1046   AST 26 04/05/2014 0849   ALT 32 12/18/2022 0949   ALT 31 10/29/2022 1046   ALT 30 04/05/2014 0849   BILITOT 0.7 12/18/2022 0949   BILITOT 0.9 10/29/2022 1046   BILITOT 0.71 04/05/2014 0849       RADIOGRAPHIC STUDIES: I have personally reviewed the radiological images as listed and agreed with the findings in the report. No results found.

## 2022-12-19 ENCOUNTER — Encounter: Payer: Self-pay | Admitting: Hematology and Oncology

## 2022-12-21 LAB — KAPPA/LAMBDA LIGHT CHAINS
Kappa free light chain: 21.8 mg/L — ABNORMAL HIGH (ref 3.3–19.4)
Kappa, lambda light chain ratio: 2.32 — ABNORMAL HIGH (ref 0.26–1.65)
Lambda free light chains: 9.4 mg/L (ref 5.7–26.3)

## 2022-12-23 ENCOUNTER — Telehealth: Payer: Self-pay | Admitting: Family Medicine

## 2022-12-23 LAB — MULTIPLE MYELOMA PANEL, SERUM
Albumin SerPl Elph-Mcnc: 3.2 g/dL (ref 2.9–4.4)
Albumin/Glob SerPl: 1.4 (ref 0.7–1.7)
Alpha 1: 0.2 g/dL (ref 0.0–0.4)
Alpha2 Glob SerPl Elph-Mcnc: 0.9 g/dL (ref 0.4–1.0)
B-Globulin SerPl Elph-Mcnc: 0.8 g/dL (ref 0.7–1.3)
Gamma Glob SerPl Elph-Mcnc: 0.5 g/dL (ref 0.4–1.8)
Globulin, Total: 2.4 g/dL (ref 2.2–3.9)
IgA: 20 mg/dL — ABNORMAL LOW (ref 61–437)
IgG (Immunoglobin G), Serum: 606 mg/dL (ref 603–1613)
IgM (Immunoglobulin M), Srm: 20 mg/dL (ref 15–143)
M Protein SerPl Elph-Mcnc: 0.3 g/dL — ABNORMAL HIGH
Total Protein ELP: 5.6 g/dL — ABNORMAL LOW (ref 6.0–8.5)

## 2022-12-23 NOTE — Telephone Encounter (Signed)
[place]. to schedule their annual wellness visit. Appointment made for 3/26.  [facility] [employer] [facility] direct phone # [phone]

## 2022-12-24 ENCOUNTER — Telehealth: Payer: Self-pay

## 2022-12-24 NOTE — Telephone Encounter (Signed)
Called and given below message. He verbalized understanding. 

## 2022-12-24 NOTE — Telephone Encounter (Signed)
-----   Message from Heath Lark, MD sent at 12/24/2022  8:12 AM EDT ----- Let him know myeloma panel is better

## 2022-12-29 ENCOUNTER — Ambulatory Visit (INDEPENDENT_AMBULATORY_CARE_PROVIDER_SITE_OTHER): Payer: Medicare Other

## 2022-12-29 VITALS — Ht 69.0 in | Wt 222.0 lb

## 2022-12-29 DIAGNOSIS — Z0000 Encounter for general adult medical examination without abnormal findings: Secondary | ICD-10-CM

## 2022-12-29 NOTE — Patient Instructions (Signed)
Jesse Mcclure , Thank you for taking time to come for your Medicare Wellness Visit. I appreciate your ongoing commitment to your health goals. Please review the following plan we discussed and let me know if I can assist you in the future.   These are the goals we discussed:  Goals      Patient Stated     12/26/2021, stay alive     Patient Stated     12/29/2022, wants to lose more weight        This is a list of the screening recommended for you and due dates:  Health Maintenance  Topic Date Due   Mammogram  Never done   DEXA scan (bone density measurement)  Never done   COVID-19 Vaccine (3 - Pfizer risk series) 01/30/2020   Medicare Annual Wellness Visit  12/29/2023   Colon Cancer Screening  10/29/2024   DTaP/Tdap/Td vaccine (5 - Td or Tdap) 08/27/2030   Pneumonia Vaccine  Completed   Flu Shot  Completed   Hepatitis C Screening: USPSTF Recommendation to screen - Ages 18-79 yo.  Completed   Zoster (Shingles) Vaccine  Completed   HPV Vaccine  Aged Out    Advanced directives: copy in chart  Conditions/risks identified: none  Next appointment: Follow up in one year for your annual wellness visit.   Preventive Care 26 Years and Older, Male  Preventive care refers to lifestyle choices and visits with your health care provider that can promote health and wellness. What does preventive care include? A yearly physical exam. This is also called an annual well check. Dental exams once or twice a year. Routine eye exams. Ask your health care provider how often you should have your eyes checked. Personal lifestyle choices, including: Daily care of your teeth and gums. Regular physical activity. Eating a healthy diet. Avoiding tobacco and drug use. Limiting alcohol use. Practicing safe sex. Taking low doses of aspirin every day. Taking vitamin and mineral supplements as recommended by your health care provider. What happens during an annual well check? The services and screenings  done by your health care provider during your annual well check will depend on your age, overall health, lifestyle risk factors, and family history of disease. Counseling  Your health care provider may ask you questions about your: Alcohol use. Tobacco use. Drug use. Emotional well-being. Home and relationship well-being. Sexual activity. Eating habits. History of falls. Memory and ability to understand (cognition). Work and work Statistician. Screening  You may have the following tests or measurements: Height, weight, and BMI. Blood pressure. Lipid and cholesterol levels. These may be checked every 5 years, or more frequently if you are over 5 years old. Skin check. Lung cancer screening. You may have this screening every year starting at age 48 if you have a 30-pack-year history of smoking and currently smoke or have quit within the past 15 years. Fecal occult blood test (FOBT) of the stool. You may have this test every year starting at age 44. Flexible sigmoidoscopy or colonoscopy. You may have a sigmoidoscopy every 5 years or a colonoscopy every 10 years starting at age 15. Prostate cancer screening. Recommendations will vary depending on your family history and other risks. Hepatitis C blood test. Hepatitis B blood test. Sexually transmitted disease (STD) testing. Diabetes screening. This is done by checking your blood sugar (glucose) after you have not eaten for a while (fasting). You may have this done every 1-3 years. Abdominal aortic aneurysm (AAA) screening. You may need this  if you are a current or former smoker. Osteoporosis. You may be screened starting at age 52 if you are at high risk. Talk with your health care provider about your test results, treatment options, and if necessary, the need for more tests. Vaccines  Your health care provider may recommend certain vaccines, such as: Influenza vaccine. This is recommended every year. Tetanus, diphtheria, and acellular  pertussis (Tdap, Td) vaccine. You may need a Td booster every 10 years. Zoster vaccine. You may need this after age 58. Pneumococcal 13-valent conjugate (PCV13) vaccine. One dose is recommended after age 97. Pneumococcal polysaccharide (PPSV23) vaccine. One dose is recommended after age 76. Talk to your health care provider about which screenings and vaccines you need and how often you need them. This information is not intended to replace advice given to you by your health care provider. Make sure you discuss any questions you have with your health care provider. Document Released: 10/18/2015 Document Revised: 06/10/2016 Document Reviewed: 07/23/2015 Elsevier Interactive Patient Education  2017 [employer] Prevention in the Home Falls can cause injuries. They can happen to people of all ages. There are many things you can do to make your home safe and to help prevent falls. What can I do on the outside of my home? Regularly fix the edges of walkways and driveways and fix any cracks. Remove anything that might make you trip as you walk through a door, such as a raised step or threshold. Trim any bushes or trees on the path to your home. Use bright outdoor lighting. Clear any walking paths of anything that might make someone trip, such as rocks or tools. Regularly check to see if handrails are loose or broken. Make sure that both sides of any steps have handrails. Any raised decks and porches should have guardrails on the edges. Have any leaves, snow, or ice cleared regularly. Use sand or salt on walking paths during winter. Clean up any spills in your garage right away. This includes oil or grease spills. What can I do in the bathroom? Use night lights. Install grab bars by the toilet and in the tub and shower. Do not use towel bars as grab bars. Use non-skid mats or decals in the tub or shower. If you need to sit down in the shower, use a plastic, non-slip stool. Keep the floor  dry. Clean up any water that spills on the floor as soon as it happens. Remove soap buildup in the tub or shower regularly. Attach bath mats securely with double-sided non-slip rug tape. Do not have throw rugs and other things on the floor that can make you trip. What can I do in the bedroom? Use night lights. Make sure that you have a light by your bed that is easy to reach. Do not use any sheets or blankets that are too big for your bed. They should not hang down onto the floor. Have a firm chair that has side arms. You can use this for support while you get dressed. Do not have throw rugs and other things on the floor that can make you trip. What can I do in the kitchen? Clean up any spills right away. Avoid walking on wet floors. Keep items that you use a lot in easy-to-reach places. If you need to reach something above you, use a strong step stool that has a grab bar. Keep electrical cords out of the way. Do not use floor polish or wax that makes floors slippery.  If you must use wax, use non-skid floor wax. Do not have throw rugs and other things on the floor that can make you trip. What can I do with my stairs? Do not leave any items on the stairs. Make sure that there are handrails on both sides of the stairs and use them. Fix handrails that are broken or loose. Make sure that handrails are as long as the stairways. Check any carpeting to make sure that it is firmly attached to the stairs. Fix any carpet that is loose or worn. Avoid having throw rugs at the top or bottom of the stairs. If you do have throw rugs, attach them to the floor with carpet tape. Make sure that you have a light switch at the top of the stairs and the bottom of the stairs. If you do not have them, ask someone to add them for you. What else can I do to help prevent falls? Wear shoes that: Do not have high heels. Have rubber bottoms. Are comfortable and fit you well. Are closed at the toe. Do not wear  sandals. If you use a stepladder: Make sure that it is fully opened. Do not climb a closed stepladder. Make sure that both sides of the stepladder are locked into place. Ask someone to hold it for you, if possible. Clearly mark and make sure that you can see: Any grab bars or handrails. First and last steps. Where the edge of each step is. Use tools that help you move around (mobility aids) if they are needed. These include: Canes. Walkers. Scooters. Crutches. Turn on the lights when you go into a dark area. Replace any light bulbs as soon as they burn out. Set up your furniture so you have a clear path. Avoid moving your furniture around. If any of your floors are uneven, fix them. If there are any pets around you, be aware of where they are. Review your medicines with your doctor. Some medicines can make you feel dizzy. This can increase your chance of falling. Ask your doctor what other things that you can do to help prevent falls. This information is not intended to replace advice given to you by your health care provider. Make sure you discuss any questions you have with your health care provider. Document Released: 07/18/2009 Document Revised: 02/27/2016 Document Reviewed: 10/26/2014 Elsevier Interactive Patient Education  2017 [employer].

## 2022-12-29 NOTE — Progress Notes (Signed)
I connected with  Jesse Mcclure. on 12/29/22 by a audio enabled telemedicine application and verified that I am speaking with the correct person using two identifiers.  Patient Location: Home  Provider Location: Office/Clinic  I discussed the limitations of evaluation and management by telemedicine. The patient expressed understanding and agreed to proceed.  Subjective:   Jesse Rummel. is a 72 y.o. male who presents for Medicare Annual/Subsequent preventive examination.  Review of Systems     Cardiac Risk Factors include: advanced age (>81men, >73 women);dyslipidemia;hypertension;obesity (BMI >30kg/m2)     Objective:    Today's Vitals   12/29/22 0911  Weight: 222 lb (100.7 kg)  Height: 5\' 9"  (1.753 m)   Body mass index is 32.78 kg/m.     12/29/2022    9:16 AM 01/06/2022    1:55 PM 12/26/2021    2:41 PM 12/26/2021   11:39 AM 12/02/2021   11:18 AM 09/16/2021    3:15 AM 07/15/2021    1:29 PM  Advanced Directives  Does Patient Have a Medical Advance Directive? Yes Yes Yes No Yes No Yes  Type of Paramedic of [place];Living will  [place];Living will [place];Living will   [place];Living will  Does patient want to make changes to medical advance directive?  No - Patient declined  No - Patient declined No - Patient declined  No - Patient declined  Copy of [place] in Chart? Yes - validated most recent copy scanned in chart (See row information)  No - copy requested    No - copy requested  Would patient like information on creating a medical advance directive?    No - Patient declined  No - Patient declined     Current Medications (verified) Outpatient Encounter Medications as of 12/29/2022  Medication Sig   acetaminophen (TYLENOL) 650 MG CR tablet Take 650 mg by mouth every 8 (eight) hours as needed for pain.   acyclovir (ZOVIRAX) 400 MG tablet Take 1 tablet (400 mg  total) by mouth daily.   atorvastatin (LIPITOR) 20 MG tablet TAKE 1 TABLET(20 MG) BY MOUTH DAILY   calcium carbonate (TUMS - DOSED IN MG ELEMENTAL CALCIUM) 500 MG chewable tablet Chew 1-2 tablets by mouth daily as needed for heartburn.   Cholecalciferol (VITAMIN D) 50 MCG (2000 UT) tablet Take 2,000 Units by mouth daily.   dexamethasone (DECADRON) 4 MG tablet 3 tabs weekly   folic acid (FOLVITE) A999333 MCG tablet Take 400 mcg by mouth daily.   ixazomib citrate (NINLARO) 3 MG capsule Take 1 capsule (3 mg) by mouth weekly, 3 weeks on, 1 week off, repeat every 4 weeks. Take on an empty stomach 1hr before or 2hr after meals.   losartan-hydrochlorothiazide (HYZAAR) 100-12.5 MG tablet TAKE 1 TABLET BY MOUTH DAILY   Multiple Vitamin (MULTI-VITAMIN) tablet Take 1 tablet by mouth daily.   ondansetron (ZOFRAN) 8 MG tablet Take 1 tablet (8 mg total) by mouth 2 (two) times daily as needed (Nausea or vomiting).   oxyCODONE (ROXICODONE) 5 MG immediate release tablet Take 1 tablet (5 mg total) by mouth every 6 (six) hours as needed for severe pain.   Polyethyl Glycol-Propyl Glycol (SYSTANE OP) Place 1 drop into both eyes daily as needed (dry eyes).   pomalidomide (POMALYST) 3 MG capsule Take 1 capsule (3mg ) by mouth daily for 21 days, rest 7 days, repeat every 28 days.   prochlorperazine (COMPAZINE) 10 MG tablet TAKE  1 TABLET(10 MG) BY MOUTH EVERY 6 HOURS AS NEEDED FOR NAUSEA OR VOMITING   rivaroxaban (XARELTO) 20 MG TABS tablet Take 1 tablet (20 mg total) by mouth daily with supper.   vitamin B-12 (CYANOCOBALAMIN) 1000 MCG tablet Take 1,000 mcg by mouth daily.   No facility-administered encounter medications on file as of 12/29/2022.    Allergies (verified) Patient has no known allergies.   History: Past Medical History:  Diagnosis Date   BPH (benign prostatic hypertrophy)    Degenerative disc disease    Diverticulosis    Hemochromatosis    HTN (hypertension)    Hyperlipidemia    Multiple myeloma (HCC)     Polio    Status post Nissen fundoplication (without gastrostomy tube) procedure    for hiatal hernia.   Past Surgical History:  Procedure Laterality Date   FOOT SURGERY     HIATAL HERNIA REPAIR     OTHER SURGICAL HISTORY     Cataract Surgery--Both eyes   SKIN BIOPSY Right 04/13/2022   squamous cell carcinoma in stiu arising in actinic keratosis   TOTAL KNEE ARTHROPLASTY Left 07/15/2021   Procedure: TOTAL KNEE ARTHROPLASTY;  Surgeon: Marchia Bond, MD;  Location: [facility];  Service: Orthopedics;  Laterality: Left;   Undescended testes Right 10/05/1966   Family History  Problem Relation Age of Onset   Breast cancer Mother    Breast cancer Sister    Social History   Socioeconomic History   Marital status: Single    Spouse name: Not on file   Number of children: Not on file   Years of education: Not on file   Highest education level: Not on file  Occupational History   Not on file  Tobacco Use   Smoking status: Never   Smokeless tobacco: Never  Vaping Use   Vaping Use: Never used  Substance and Sexual Activity   Alcohol use: Not Currently    Alcohol/week: 4.0 standard drinks of alcohol    Types: 4 drink(s) per week    Comment: occasionally   Drug use: No   Sexual activity: Yes  Other Topics Concern   Not on file  Social History Narrative   Not on file   Social Determinants of Health   Financial Resource Strain: Low Risk  (12/29/2022)   Overall Financial Resource Strain (CARDIA)    Difficulty of Paying Living Expenses: Not hard at all  Food Insecurity: No Food Insecurity (12/29/2022)   Hunger Vital Sign    Worried About Running Out of Food in the Last Year: Never true    Ran Out of Food in the Last Year: Never true  Transportation Needs: No Transportation Needs (12/29/2022)   PRAPARE - Hydrologist (Medical): No    Lack of Transportation (Non-Medical): No  Physical Activity: Insufficiently Active (12/29/2022)   Exercise Vital Sign     Days of Exercise per Week: 2 days    Minutes of Exercise per Session: 20 min  Stress: No Stress Concern Present (12/29/2022)   [place]    Feeling of Stress : Not at all  Social Connections: Not on file    Tobacco Counseling Counseling given: Not Answered   Clinical Intake:  Pre-visit preparation completed: Yes  Pain : No/denies pain     Nutritional Status: BMI > 30  Obese Nutritional Risks: None Diabetes: No  How often do you need to have someone help you when you read instructions, pamphlets,  or other written materials from your doctor or pharmacy?: 1 - Never  Diabetic? no  Interpreter Needed?: No  Information entered by :: NAllen LPN   Activities of Daily Living    12/29/2022    9:17 AM  In your present state of health, do you have any difficulty performing the following activities:  Hearing? 0  Vision? 0  Difficulty concentrating or making decisions? 0  Walking or climbing stairs? 0  Dressing or bathing? 0  Doing errands, shopping? 0  Preparing Food and eating ? N  Using the Toilet? N  In the past six months, have you accidently leaked urine? N  Do you have problems with loss of bowel control? N  Managing your Medications? N  Managing your Finances? N  Housekeeping or managing your Housekeeping? N    Patient Care Team: Denita Lung, MD as PCP - General (Family Medicine) Heath Lark, MD as Consulting Physician (Hematology and Oncology)  Indicate any recent Medical Services you may have received from other than [facility] providers in the past year (date may be approximate).     Assessment:   This is a routine wellness examination for Jesse Mcclure.  Hearing/Vision screen Vision Screening - Comments:: Regular eye exams, [facility]  Dietary issues and exercise activities discussed: Current Exercise Habits: Home exercise routine, Type of exercise: Other - see comments (stationary bike),  Time (Minutes): 20, Frequency (Times/Week): 2, Weekly Exercise (Minutes/Week): 40   Goals Addressed             This Visit's Progress    Patient Stated       12/29/2022, wants to lose more weight       Depression Screen    12/29/2022    9:16 AM 12/26/2021    2:42 PM 03/17/2021   10:13 AM 10/11/2018   10:42 AM 08/24/2018   10:12 AM 07/29/2017    9:15 AM 05/20/2015    8:56 AM  PHQ 2/9 Scores  PHQ - 2 Score 0 0 0 0 0 0 0  PHQ- 9 Score 0          Fall Risk    12/29/2022    9:16 AM 12/26/2021    2:42 PM 12/24/2021    3:13 PM 09/22/2021    2:15 PM 10/11/2018   10:42 AM  [place] in the past year? 0 0 0 0 0  Number falls in past yr: 0  0 0 0  Injury with Fall? 0  0 0 0  Risk for fall due to : Medication side effect Medication side effect  No Fall Risks   Follow up Falls prevention discussed;Education provided;Falls evaluation completed Falls evaluation completed;Education provided;Falls prevention discussed  Falls evaluation completed     FALL RISK PREVENTION PERTAINING TO THE HOME:  Any stairs in or around the home? Yes  If so, are there any without handrails? No  Home free of loose throw rugs in walkways, pet beds, electrical cords, etc? Yes  Adequate lighting in your home to reduce risk of falls? Yes   ASSISTIVE DEVICES UTILIZED TO PREVENT FALLS:  Life alert? No  Use of a cane, walker or w/c? No  Grab bars in the bathroom? Yes  Shower chair or bench in shower? Yes  Elevated toilet seat or a handicapped toilet? Yes   TIMED UP AND GO:  Was the test performed? No .      Cognitive Function:        12/29/2022  9:18 AM 12/26/2021    2:43 PM  6CIT Screen  What Year? 0 points 0 points  What month? 0 points 0 points  What time? 0 points 0 points  Count back from 20 0 points 0 points  Months in reverse 4 points 4 points  Repeat phrase 2 points 2 points  Total Score 6 points 6 points    Immunizations Immunization History  Administered Date(s)  Administered   DTaP / IPV 02/14/2020, 05/16/2020, 08/27/2020   Fluad Quad(high Dose 65+) 05/24/2019, 07/16/2021, 07/09/2022   HIB (PRP-T) 02/14/2020, 05/16/2020, 08/27/2020   Hepatitis B, PED/ADOLESCENT 02/14/2020, 05/16/2020, 11/28/2020   Influenza Split 07/05/2012   Influenza Whole 07/11/2013, 07/31/2016   Influenza, High Dose Seasonal PF 07/29/2017, 06/27/2018, 05/24/2019   Influenza,inj,Quad PF,6+ Mos 05/22/2014   Influenza-Unspecified 05/22/2014, 08/11/2017, 05/24/2019   MMR 09/15/2021   PFIZER(Purple Top)SARS-COV-2 Vaccination 12/12/2019, 01/02/2020   Pneumococcal Conjugate-13 07/29/2017, 02/14/2020, 05/16/2020, 08/27/2020   Pneumococcal Polysaccharide-23 10/11/2018, 11/28/2020   Tdap 10/28/2012   Zoster Recombinat (Shingrix) 08/11/2017, 08/14/2017, 07/06/2018, 07/06/2018   Zoster, Live 05/16/2014    TDAP status: Up to date  Flu Vaccine status: Up to date  Pneumococcal vaccine status: Up to date  Covid-19 vaccine status: Completed vaccines  Qualifies for Shingles Vaccine? Yes   Zostavax completed Yes   Shingrix Completed?: Yes  Screening Tests Health Maintenance  Topic Date Due   MAMMOGRAM  Never done   DEXA SCAN  Never done   COVID-19 Vaccine (3 - Pfizer risk series) 01/30/2020   Medicare Annual Wellness (AWV)  12/27/2022   COLONOSCOPY (Pts 45-58yrs Insurance coverage will need to be confirmed)  10/29/2024   DTaP/Tdap/Td (5 - Td or Tdap) 08/27/2030   Pneumonia Vaccine 71+ Years old  Completed   INFLUENZA VACCINE  Completed   Hepatitis C Screening  Completed   Zoster Vaccines- Shingrix  Completed   HPV VACCINES  Aged Out    Health Maintenance  Health Maintenance Due  Topic Date Due   MAMMOGRAM  Never done   DEXA SCAN  Never done   COVID-19 Vaccine (3 - Pfizer risk series) 01/30/2020   Medicare Annual Wellness (AWV)  12/27/2022    Colorectal cancer screening: Type of screening: Colonoscopy. Completed 10/29/2014. Repeat every 10 years  Lung Cancer  Screening: (Low Dose CT Chest recommended if Age 2-80 years, 30 pack-year currently smoking OR have quit w/in 15years.) does not qualify.   Lung Cancer Screening Referral: no  Additional Screening:  Hepatitis C Screening: does qualify; Completed 05/20/2016  Vision Screening: Recommended annual ophthalmology exams for early detection of glaucoma and other disorders of the eye. Is the patient up to date with their annual eye exam?  Yes  Who is the provider or what is the name of the office in which the patient attends annual eye exams? [facility] If pt is not established with a provider, would they like to be referred to a provider to establish care? No .   Dental Screening: Recommended annual dental exams for proper oral hygiene  Community Resource Referral / Chronic Care Management: CRR required this visit?  No   CCM required this visit?  No      Plan:     I have personally reviewed and noted the following in the patient's chart:   Medical and social history Use of alcohol, tobacco or illicit drugs  Current medications and supplements including opioid prescriptions. Patient is not currently taking opioid prescriptions. Functional ability and status Nutritional status Physical activity Advanced directives List  of other physicians Hospitalizations, surgeries, and ER visits in previous 12 months Vitals Screenings to include cognitive, depression, and falls Referrals and appointments  In addition, I have reviewed and discussed with patient certain preventive protocols, quality metrics, and best practice recommendations. A written personalized care plan for preventive services as well as general preventive health recommendations were provided to patient.     Kellie Simmering, LPN   [id number]   Nurse Notes: none  Due to this being a virtual visit, the after visit summary with patients personalized plan was offered to patient via mail or my-chart. Patient would like to  access on my-chart

## 2022-12-31 ENCOUNTER — Other Ambulatory Visit: Payer: Self-pay | Admitting: *Deleted

## 2022-12-31 DIAGNOSIS — C9000 Multiple myeloma not having achieved remission: Secondary | ICD-10-CM

## 2022-12-31 MED ORDER — POMALIDOMIDE 3 MG PO CAPS: ORAL_CAPSULE | ORAL | 0 refills | Status: DC

## 2023-01-05 ENCOUNTER — Other Ambulatory Visit: Payer: Self-pay | Admitting: Hematology and Oncology

## 2023-01-05 DIAGNOSIS — C9000 Multiple myeloma not having achieved remission: Secondary | ICD-10-CM

## 2023-01-05 MED ORDER — IXAZOMIB CITRATE 3 MG PO CAPS: ORAL_CAPSULE | ORAL | 11 refills | Status: DC

## 2023-01-22 ENCOUNTER — Encounter: Payer: Self-pay | Admitting: Hematology and Oncology

## 2023-01-22 ENCOUNTER — Inpatient Hospital Stay: Payer: Medicare Other | Attending: Hematology and Oncology

## 2023-01-22 ENCOUNTER — Inpatient Hospital Stay (HOSPITAL_BASED_OUTPATIENT_CLINIC_OR_DEPARTMENT_OTHER): Payer: Medicare Other | Admitting: Hematology and Oncology

## 2023-01-22 DIAGNOSIS — G8929 Other chronic pain: Secondary | ICD-10-CM | POA: Insufficient documentation

## 2023-01-22 DIAGNOSIS — M7918 Myalgia, other site: Secondary | ICD-10-CM | POA: Insufficient documentation

## 2023-01-22 DIAGNOSIS — C9000 Multiple myeloma not having achieved remission: Secondary | ICD-10-CM | POA: Diagnosis not present

## 2023-01-22 DIAGNOSIS — Z79899 Other long term (current) drug therapy: Secondary | ICD-10-CM | POA: Diagnosis not present

## 2023-01-22 DIAGNOSIS — M549 Dorsalgia, unspecified: Secondary | ICD-10-CM | POA: Diagnosis not present

## 2023-01-22 DIAGNOSIS — Z9221 Personal history of antineoplastic chemotherapy: Secondary | ICD-10-CM | POA: Diagnosis not present

## 2023-01-22 DIAGNOSIS — M533 Sacrococcygeal disorders, not elsewhere classified: Secondary | ICD-10-CM | POA: Diagnosis not present

## 2023-01-22 DIAGNOSIS — Z7961 Long term (current) use of immunomodulator: Secondary | ICD-10-CM | POA: Insufficient documentation

## 2023-01-22 DIAGNOSIS — Z7901 Long term (current) use of anticoagulants: Secondary | ICD-10-CM | POA: Insufficient documentation

## 2023-01-22 DIAGNOSIS — I700 Atherosclerosis of aorta: Secondary | ICD-10-CM | POA: Insufficient documentation

## 2023-01-22 DIAGNOSIS — Z923 Personal history of irradiation: Secondary | ICD-10-CM | POA: Diagnosis not present

## 2023-01-22 LAB — CBC WITH DIFFERENTIAL/PLATELET
Abs Immature Granulocytes: 0.03 10*3/uL (ref 0.00–0.07)
Basophils Absolute: 0 10*3/uL (ref 0.0–0.1)
Basophils Relative: 0 %
Eosinophils Absolute: 0.1 10*3/uL (ref 0.0–0.5)
Eosinophils Relative: 1 %
HCT: 40.8 % (ref 39.0–52.0)
Hemoglobin: 13.8 g/dL (ref 13.0–17.0)
Immature Granulocytes: 0 %
Lymphocytes Relative: 24 %
Lymphs Abs: 1.7 10*3/uL (ref 0.7–4.0)
MCH: 34.8 pg — ABNORMAL HIGH (ref 26.0–34.0)
MCHC: 33.8 g/dL (ref 30.0–36.0)
MCV: 103 fL — ABNORMAL HIGH (ref 80.0–100.0)
Monocytes Absolute: 0.8 10*3/uL (ref 0.1–1.0)
Monocytes Relative: 12 %
Neutro Abs: 4.3 10*3/uL (ref 1.7–7.7)
Neutrophils Relative %: 63 %
Platelets: 147 10*3/uL — ABNORMAL LOW (ref 150–400)
RBC: 3.96 MIL/uL — ABNORMAL LOW (ref 4.22–5.81)
RDW: 14.1 % (ref 11.5–15.5)
WBC: 6.9 10*3/uL (ref 4.0–10.5)
nRBC: 0 % (ref 0.0–0.2)

## 2023-01-22 LAB — COMPREHENSIVE METABOLIC PANEL
ALT: 30 U/L (ref 0–44)
AST: 18 U/L (ref 15–41)
Albumin: 3.9 g/dL (ref 3.5–5.0)
Alkaline Phosphatase: 58 U/L (ref 38–126)
Anion gap: 5 (ref 5–15)
BUN: 26 mg/dL — ABNORMAL HIGH (ref 8–23)
CO2: 31 mmol/L (ref 22–32)
Calcium: 8.8 mg/dL — ABNORMAL LOW (ref 8.9–10.3)
Chloride: 106 mmol/L (ref 98–111)
Creatinine, Ser: 0.81 mg/dL (ref 0.61–1.24)
GFR, Estimated: >60 mL/min (ref >60)
Glucose, Bld: 163 mg/dL — ABNORMAL HIGH (ref 70–99)
Potassium: 3.5 mmol/L (ref 3.5–5.1)
Sodium: 142 mmol/L (ref 135–145)
Total Bilirubin: 0.8 mg/dL (ref 0.3–1.2)
Total Protein: 6.1 g/dL — ABNORMAL LOW (ref 6.5–8.1)

## 2023-01-22 MED ORDER — PROCHLORPERAZINE MALEATE 10 MG PO TABS
ORAL_TABLET | ORAL | 1 refills | Status: DC
Start: 2023-01-22 — End: 2024-03-13

## 2023-01-22 MED ORDER — OXYCODONE HCL 5 MG PO TABS
5.0000 mg | ORAL_TABLET | Freq: Four times a day (QID) | ORAL | 0 refills | Status: DC | PRN
Start: 2023-01-22 — End: 2023-05-12

## 2023-01-22 NOTE — Progress Notes (Signed)
[facility] OFFICE PROGRESS NOTE  Patient Care Team: Ronnald Nian, MD as PCP - General (Family Medicine) Artis Delay, MD as Consulting Physician (Hematology and Oncology)  ASSESSMENT & PLAN:  Multiple myeloma without remission (HCC) So far, he tolerated treatment very well with no major side effects For his next cycle of treatment, I recommend the patient resume chemotherapy 1 day early and to finish his treatment while he is going away for his trip on [place] He will return here on May 27 and will resume his next cycle afterwards He will continue calcium with vitamin D and Zometa every 6 months, next due in September He will continue to take his anticoagulation therapy for DVT prophylaxis  Chronic musculoskeletal pain I refilled his prescription medication to take as needed  No orders of the defined types were placed in this encounter.   All questions were answered. The patient knows to call the clinic with any problems, questions or concerns. The total time spent in the appointment was 25 minutes encounter with patients including review of chart and various tests results, discussions about plan of care and coordination of care plan   Artis Delay, MD 01/22/2023 11:51 AM  INTERVAL HISTORY: Please see below for problem oriented charting. he returns for treatment follow-up on Ninlaro, Pomalyst and dexamethasone He has no side effects from treatment so far No recent infection His chronic back pain is stable The patient will be going away for a trip in [place] between May 18 to May 27  REVIEW OF SYSTEMS:   Constitutional: Denies fevers, chills or abnormal weight loss Eyes: Denies blurriness of vision Ears, nose, mouth, throat, and face: Denies mucositis or sore throat Respiratory: Denies cough, dyspnea or wheezes Cardiovascular: Denies palpitation, chest discomfort or lower extremity swelling Gastrointestinal:  Denies nausea, heartburn or change in bowel habits Skin:  Denies abnormal skin rashes Lymphatics: Denies new lymphadenopathy or easy bruising Neurological:Denies numbness, tingling or new weaknesses Behavioral/Psych: Mood is stable, no new changes  All other systems were reviewed with the patient and are negative.  I have reviewed the past medical history, past surgical history, social history and family history with the patient and they are unchanged from previous note.  ALLERGIES:  has No Known Allergies.  MEDICATIONS:  Current Outpatient Medications  Medication Sig Dispense Refill   acetaminophen (TYLENOL) 650 MG CR tablet Take 650 mg by mouth every 8 (eight) hours as needed for pain.     acyclovir (ZOVIRAX) 400 MG tablet Take 1 tablet (400 mg total) by mouth daily. 90 tablet 1   atorvastatin (LIPITOR) 20 MG tablet TAKE 1 TABLET(20 MG) BY MOUTH DAILY 90 tablet 1   calcium carbonate (TUMS - DOSED IN MG ELEMENTAL CALCIUM) 500 MG chewable tablet Chew 1-2 tablets by mouth daily as needed for heartburn.     Cholecalciferol (VITAMIN D) 50 MCG (2000 UT) tablet Take 2,000 Units by mouth daily.     dexamethasone (DECADRON) 4 MG tablet 3 tabs weekly 12 tablet 6   folic acid (FOLVITE) 400 MCG tablet Take 400 mcg by mouth daily.     ixazomib citrate (NINLARO) 3 MG capsule Take 1 capsule (3 mg) by mouth weekly, 3 weeks on, 1 week off, repeat every 4 weeks. Take on an empty stomach 1hr before or 2hr after meals. 3 capsule 11   losartan-hydrochlorothiazide (HYZAAR) 100-12.5 MG tablet TAKE 1 TABLET BY MOUTH DAILY 90 tablet 1   Multiple Vitamin (MULTI-VITAMIN) tablet Take 1 tablet by mouth daily.  ondansetron (ZOFRAN) 8 MG tablet Take 1 tablet (8 mg total) by mouth 2 (two) times daily as needed (Nausea or vomiting). 30 tablet 1   oxyCODONE (ROXICODONE) 5 MG immediate release tablet Take 1 tablet (5 mg total) by mouth every 6 (six) hours as needed for severe pain. 90 tablet 0   Polyethyl Glycol-Propyl Glycol (SYSTANE OP) Place 1 drop into both eyes daily as  needed (dry eyes).     pomalidomide (POMALYST) 3 MG capsule Take 1 capsule (3mg ) by mouth daily for 21 days, rest 7 days, repeat every 28 days. 21 capsule 0   prochlorperazine (COMPAZINE) 10 MG tablet TAKE 1 TABLET(10 MG) BY MOUTH EVERY 6 HOURS AS NEEDED FOR NAUSEA OR VOMITING 60 tablet 1   rivaroxaban (XARELTO) 20 MG TABS tablet Take 1 tablet (20 mg total) by mouth daily with supper. 90 tablet 1   vitamin B-12 (CYANOCOBALAMIN) 1000 MCG tablet Take 1,000 mcg by mouth daily.     No current facility-administered medications for this visit.    SUMMARY OF ONCOLOGIC HISTORY: Oncology History  Multiple myeloma without remission  03/20/2019 Imaging   1. Tumor involving the L5 and S1 and S2 segments of the spine as described with mass effects upon the left L4, L5, S1 and more distal left sacral nerves as described above. 2. Does the patient have a history of malignancy? 3. Multiple plasmacytomas and metastatic disease could give this appearance   03/27/2019 Pathology Results   Soft Tissue Needle Core Biopsy, sacral mass - PLASMABLASTIC NEOPLASM. - SEE COMMENT. Microscopic Comment The sections show needle core biopsy fragments of soft tissue densely infiltrated by a relatively monomorphic infiltrate of atypical plasmacytoid cells characterized by vesicular or partially clumped chromatin and prominent nucleoli. This is associated with scattered mitosis. A battery of immunohistochemical stains was performed and show that the atypical plasmacytoid cells are positive for CD138, CD43 and cytoplasmic kappa. There is also weak positivity for CD56 and partial variable positivity for cyclin D1. The atypical plasmacytoid cells are negative for cytoplasmic lambda, CD10, PAX5, CD79a, CD20, CD3, CD5, CD34, EBV (ISH) and mostly negative for LCA. The overall morphologic and histologic features are most compatible with a plasmablastic neoplasm. The differential diagnosis includes plasmablastic lymphoma and plasmablastic  plasmacytoma/myeloma. Based on the overall phenotypic features and the clinical setting, plasmacytoma/myeloma is favored. Clinical correlation and hematologic evaluation is recommended   03/27/2019 Procedure   Technically successful CT-guided biopsy of sacral soft tissue mass.   04/04/2019 Initial Diagnosis   Multiple myeloma without remission (HCC)   04/11/2019 PET scan   IMPRESSION: 1. Previously noted expansile lesions involving the L5 vertebra and sacrum are again noted and exhibit intense FDG uptake compatible with metabolically active disease. A third focus of increased uptake without corresponding CT abnormality is noted within the intertrochanteric portions of the proximal right femur. 2. Small lucent lesion within the T3 vertebra is noted without corresponding FDG uptake. 3. Asymmetric left bladder wall thickening, etiology indeterminate. 4. Aortic Atherosclerosis (ICD10-I70.0). Lad coronary artery calcification.   04/12/2019 Cancer Staging   Staging form: Plasma Cell Myeloma and Plasma Cell Disorders, AJCC 8th Edition - Clinical stage from 04/12/2019: Beta-2-microglobulin (mg/L): 2, Albumin (g/dL): 3.6, ISS: Stage I, High-risk cytogenetics: Unknown, LDH: Unknown - Signed by Artis Delay, MD on 04/12/2019   04/14/2019 Bone Marrow Biopsy   Bone Marrow, Aspirate,Biopsy, and Clot, right iliac bone - PLASMA CELL MYELOMA.   04/17/2019 - 07/11/2019 Chemotherapy   The patient had bortezemib, revlimid and dexamethasone for chemotherapy treatment.  08/17/2019 Bone Marrow Transplant   He received high dose melphalan followed by autologous stem cell transplant at [facility]   11/22/2019 Bone Marrow Biopsy   BONE MARROW biopsy at [facility] : [facility]:       Mild monoclonal plasmacytosis (approximately 2%), kappa light chain restricted.    11/22/2019 PET scan   PET CT at [facility].  Similar size and appearance of mildly hypermetabolic lytic lesion in the L5 vertebral body and posterior elements.  2.   Otherwise, no substantial FDG uptake compared to background bone marrow in the additional osseous lucent lesions.    12/20/2019 - 05/09/2020 Radiation Therapy   Radiation Treatment Dates: 12/20/2019 through 01/08/2020 Site Technique Total Dose (Gy) Dose per Fx (Gy) Completed Fx Beam Energies  Lumbar Spine: Spine 3D 35/35 2.5 14/14 15X        02/29/2020 PET scan   1. Similar size and appearance of expansile lytic lesion in the L5 vertebral body and posterior elements with minimal FDG activity.  2. Otherwise, no additional osseous lucent lesions with FDG uptake above background bone marrow   05/31/2020 -  Chemotherapy   The patient had Revlimid for chemotherapy treatment.     11/25/2021 - 05/11/2022 Chemotherapy   Patient is on Treatment Plan : MYELOMA RELAPSED / REFRACTORY Daratumumab SQ + Bortezomib + Dexamethasone (DaraVd) q21d / Daratumumab SQ q28d      11/25/2021 - 10/29/2022 Chemotherapy   Patient is on Treatment Plan : MYELOMA RELAPSED / REFRACTORY Daratumumab SQ + Bortezomib + Dexamethasone (DaraVd) q21d / Daratumumab SQ q28d        PHYSICAL EXAMINATION: ECOG PERFORMANCE STATUS: 1 - Symptomatic but completely ambulatory  Vitals:   01/22/23 1006  BP: (!) 147/67  Pulse: 73  Resp: 18  Temp: 97.9 F (36.6 C)  SpO2: 97%   Filed Weights   01/22/23 1006  Weight: 251 lb 12.8 oz (114.2 kg)    GENERAL:alert, no distress and comfortable  NEURO: alert & oriented x 3 with fluent speech, no focal motor/sensory deficits  LABORATORY DATA:  I have reviewed the data as listed    Component Value Date/Time   NA 142 01/22/2023 0950   NA 139 10/11/2018 1115   NA 139 04/05/2014 0849   K 3.5 01/22/2023 0950   K 4.4 04/05/2014 0849   CL 106 01/22/2023 0950   CO2 31 01/22/2023 0950   CO2 23 04/05/2014 0849   GLUCOSE 163 (H) 01/22/2023 0950   GLUCOSE 109 04/05/2014 0849   BUN 26 (H) 01/22/2023 0950   BUN 23 10/11/2018 1115   BUN 16.8 04/05/2014 0849   CREATININE 0.81 01/22/2023 0950    CREATININE 0.80 10/29/2022 1046   CREATININE 0.91 07/29/2017 1004   CREATININE 0.9 04/05/2014 0849   CALCIUM 8.8 (L) 01/22/2023 0950   CALCIUM 8.7 04/05/2014 0849   PROT 6.1 (L) 01/22/2023 0950   PROT 7.2 10/11/2018 1115   PROT 6.7 04/05/2014 0849   ALBUMIN 3.9 01/22/2023 0950   ALBUMIN 4.7 10/11/2018 1115   ALBUMIN 3.8 04/05/2014 0849   AST 18 01/22/2023 0950   AST 24 10/29/2022 1046   AST 26 04/05/2014 0849   ALT 30 01/22/2023 0950   ALT 31 10/29/2022 1046   ALT 30 04/05/2014 0849   ALKPHOS 58 01/22/2023 0950   ALKPHOS 75 04/05/2014 0849   BILITOT 0.8 01/22/2023 0950   BILITOT 0.9 10/29/2022 1046   BILITOT 0.71 04/05/2014 0849   GFRNONAA >60 01/22/2023 0950   GFRNONAA >60 10/29/2022 1046  GFRAA >60 06/14/2020 0830    No results found for: "SPEP", "UPEP"  Lab Results  Component Value Date   WBC 6.9 01/22/2023   NEUTROABS 4.3 01/22/2023   HGB 13.8 01/22/2023   HCT 40.8 01/22/2023   MCV 103.0 (H) 01/22/2023   PLT 147 (L) 01/22/2023      Chemistry      Component Value Date/Time   NA 142 01/22/2023 0950   NA 139 10/11/2018 1115   NA 139 04/05/2014 0849   K 3.5 01/22/2023 0950   K 4.4 04/05/2014 0849   CL 106 01/22/2023 0950   CO2 31 01/22/2023 0950   CO2 23 04/05/2014 0849   BUN 26 (H) 01/22/2023 0950   BUN 23 10/11/2018 1115   BUN 16.8 04/05/2014 0849   CREATININE 0.81 01/22/2023 0950   CREATININE 0.80 10/29/2022 1046   CREATININE 0.91 07/29/2017 1004   CREATININE 0.9 04/05/2014 0849   GLU 10 04/10/2014 0000      Component Value Date/Time   CALCIUM 8.8 (L) 01/22/2023 0950   CALCIUM 8.7 04/05/2014 0849   ALKPHOS 58 01/22/2023 0950   ALKPHOS 75 04/05/2014 0849   AST 18 01/22/2023 0950   AST 24 10/29/2022 1046   AST 26 04/05/2014 0849   ALT 30 01/22/2023 0950   ALT 31 10/29/2022 1046   ALT 30 04/05/2014 0849   BILITOT 0.8 01/22/2023 0950   BILITOT 0.9 10/29/2022 1046   BILITOT 0.71 04/05/2014 0849

## 2023-01-22 NOTE — Assessment & Plan Note (Signed)
So far, he tolerated treatment very well with no major side effects For his next cycle of treatment, I recommend the patient resume chemotherapy 1 day early and to finish his treatment while he is going away for his trip on [place] He will return here on May 27 and will resume his next cycle afterwards He will continue calcium with vitamin D and Zometa every 6 months, next due in September He will continue to take his anticoagulation therapy for DVT prophylaxis

## 2023-01-22 NOTE — Assessment & Plan Note (Signed)
I refilled his prescription medication to take as needed

## 2023-01-25 LAB — KAPPA/LAMBDA LIGHT CHAINS
Kappa free light chain: 17.9 mg/L (ref 3.3–19.4)
Kappa, lambda light chain ratio: 1.83 — ABNORMAL HIGH (ref 0.26–1.65)
Lambda free light chains: 9.8 mg/L (ref 5.7–26.3)

## 2023-01-28 LAB — MULTIPLE MYELOMA PANEL, SERUM
Albumin SerPl Elph-Mcnc: 3.2 g/dL (ref 2.9–4.4)
Albumin/Glob SerPl: 1.4 (ref 0.7–1.7)
Alpha 1: 0.3 g/dL (ref 0.0–0.4)
Alpha2 Glob SerPl Elph-Mcnc: 0.9 g/dL (ref 0.4–1.0)
B-Globulin SerPl Elph-Mcnc: 0.8 g/dL (ref 0.7–1.3)
Gamma Glob SerPl Elph-Mcnc: 0.4 g/dL (ref 0.4–1.8)
Globulin, Total: 2.3 g/dL (ref 2.2–3.9)
IgA: 20 mg/dL — ABNORMAL LOW (ref 61–437)
IgG (Immunoglobin G), Serum: 497 mg/dL — ABNORMAL LOW (ref 603–1613)
IgM (Immunoglobulin M), Srm: 23 mg/dL (ref 15–143)
M Protein SerPl Elph-Mcnc: 0.2 g/dL — ABNORMAL HIGH
Total Protein ELP: 5.5 g/dL — ABNORMAL LOW (ref 6.0–8.5)

## 2023-01-29 ENCOUNTER — Telehealth: Payer: Self-pay

## 2023-01-29 NOTE — Telephone Encounter (Signed)
Called and given below message. He verbalized understand and he appreciated the call.

## 2023-01-29 NOTE — Telephone Encounter (Signed)
-----   Message from Artis Delay, MD sent at 01/29/2023  8:23 AM EDT ----- Pls let Bill know that myeloma panel is good

## 2023-02-02 ENCOUNTER — Other Ambulatory Visit: Payer: Self-pay

## 2023-02-02 DIAGNOSIS — C9000 Multiple myeloma not having achieved remission: Secondary | ICD-10-CM

## 2023-02-02 MED ORDER — POMALIDOMIDE 3 MG PO CAPS
ORAL_CAPSULE | ORAL | 0 refills | Status: DC
Start: 2023-02-02 — End: 2023-03-02

## 2023-02-18 ENCOUNTER — Ambulatory Visit: Payer: Medicare Other | Admitting: Podiatry

## 2023-03-02 ENCOUNTER — Other Ambulatory Visit: Payer: Self-pay

## 2023-03-02 DIAGNOSIS — C9000 Multiple myeloma not having achieved remission: Secondary | ICD-10-CM

## 2023-03-02 MED ORDER — POMALIDOMIDE 3 MG PO CAPS
ORAL_CAPSULE | ORAL | 0 refills | Status: DC
Start: 2023-03-02 — End: 2023-03-26

## 2023-03-08 ENCOUNTER — Other Ambulatory Visit: Payer: Self-pay

## 2023-03-08 ENCOUNTER — Telehealth: Payer: Self-pay

## 2023-03-08 MED ORDER — DEXAMETHASONE 4 MG PO TABS: ORAL_TABLET | ORAL | 6 refills | Status: DC

## 2023-03-08 NOTE — Telephone Encounter (Signed)
Returned his call. Sent dexamethasone refill as requested to his preferred pharmacy.

## 2023-03-09 ENCOUNTER — Encounter: Payer: Self-pay | Admitting: Family Medicine

## 2023-03-09 ENCOUNTER — Telehealth (INDEPENDENT_AMBULATORY_CARE_PROVIDER_SITE_OTHER): Payer: Medicare Other | Admitting: Family Medicine

## 2023-03-09 DIAGNOSIS — H1033 Unspecified acute conjunctivitis, bilateral: Secondary | ICD-10-CM | POA: Diagnosis not present

## 2023-03-09 MED ORDER — ERYTHROMYCIN 5 MG/GM OP OINT
1.0000 | TOPICAL_OINTMENT | Freq: Three times a day (TID) | OPHTHALMIC | 0 refills | Status: DC
Start: 2023-03-09 — End: 2023-05-16

## 2023-03-09 NOTE — Progress Notes (Signed)
   Subjective:    Patient ID: Jesse Mcclure., adult    DOB: 1950/11/02, 72 y.o.   MRN: [id number]  HPI Documentation for virtual audio and video telecommunications through Caregility encounter: The patient was located at home. 2 patient identifiers used.  The provider was located in the office. The patient did consent to this visit and is aware of possible charges through their insurance for this visit. The other persons participating in this telemedicine service were none. Time spent on call was 5 minutes and in review of previous records >15 minutes total for counseling and coordination of care. This virtual service is not related to other E/M service within previous 7 days.  He complains of a 1 day history of scratchy eyes and a feeling of foreign material under the left eye.  He then states that it went both eyes he is now having purulent drainage.  No fever, chills, sore throat, earache. Review of Systems     Objective:   Physical Exam Alert and in no distress.  Exam of the eyes via video did show some slight conjunctival injection.       Assessment & Plan:  Acute bacterial conjunctivitis of both eyes - Plan: erythromycin ophthalmic ointment He will call if continued difficulty.

## 2023-03-12 ENCOUNTER — Inpatient Hospital Stay (HOSPITAL_BASED_OUTPATIENT_CLINIC_OR_DEPARTMENT_OTHER): Payer: Medicare Other | Admitting: Hematology and Oncology

## 2023-03-12 ENCOUNTER — Encounter: Payer: Self-pay | Admitting: Hematology and Oncology

## 2023-03-12 ENCOUNTER — Inpatient Hospital Stay: Payer: Medicare Other | Attending: Hematology and Oncology

## 2023-03-12 VITALS — BP 118/74 | HR 77 | Temp 98.2°F | Resp 18 | Ht 69.0 in | Wt 247.8 lb

## 2023-03-12 DIAGNOSIS — Z7961 Long term (current) use of immunomodulator: Secondary | ICD-10-CM | POA: Diagnosis not present

## 2023-03-12 DIAGNOSIS — M533 Sacrococcygeal disorders, not elsewhere classified: Secondary | ICD-10-CM | POA: Diagnosis not present

## 2023-03-12 DIAGNOSIS — Z7901 Long term (current) use of anticoagulants: Secondary | ICD-10-CM | POA: Insufficient documentation

## 2023-03-12 DIAGNOSIS — Z79624 Long term (current) use of inhibitors of nucleotide synthesis: Secondary | ICD-10-CM | POA: Insufficient documentation

## 2023-03-12 DIAGNOSIS — Z79899 Other long term (current) drug therapy: Secondary | ICD-10-CM | POA: Diagnosis not present

## 2023-03-12 DIAGNOSIS — I2510 Atherosclerotic heart disease of native coronary artery without angina pectoris: Secondary | ICD-10-CM | POA: Diagnosis not present

## 2023-03-12 DIAGNOSIS — C9000 Multiple myeloma not having achieved remission: Secondary | ICD-10-CM | POA: Diagnosis not present

## 2023-03-12 DIAGNOSIS — I700 Atherosclerosis of aorta: Secondary | ICD-10-CM | POA: Insufficient documentation

## 2023-03-12 LAB — CBC WITH DIFFERENTIAL/PLATELET
Abs Immature Granulocytes: 0.02 10*3/uL (ref 0.00–0.07)
Basophils Absolute: 0 10*3/uL (ref 0.0–0.1)
Basophils Relative: 0 %
Eosinophils Absolute: 0.1 10*3/uL (ref 0.0–0.5)
Eosinophils Relative: 2 %
HCT: 43.1 % (ref 39.0–52.0)
Hemoglobin: 14.2 g/dL (ref 13.0–17.0)
Immature Granulocytes: 0 %
Lymphocytes Relative: 34 %
Lymphs Abs: 2.2 10*3/uL (ref 0.7–4.0)
MCH: 34.1 pg — ABNORMAL HIGH (ref 26.0–34.0)
MCHC: 32.9 g/dL (ref 30.0–36.0)
MCV: 103.6 fL — ABNORMAL HIGH (ref 80.0–100.0)
Monocytes Absolute: 1.1 10*3/uL — ABNORMAL HIGH (ref 0.1–1.0)
Monocytes Relative: 17 %
Neutro Abs: 3.1 10*3/uL (ref 1.7–7.7)
Neutrophils Relative %: 47 %
Platelets: 173 10*3/uL (ref 150–400)
RBC: 4.16 MIL/uL — ABNORMAL LOW (ref 4.22–5.81)
RDW: 15.6 % — ABNORMAL HIGH (ref 11.5–15.5)
WBC: 6.5 10*3/uL (ref 4.0–10.5)
nRBC: 0 % (ref 0.0–0.2)

## 2023-03-12 LAB — COMPREHENSIVE METABOLIC PANEL
ALT: 29 U/L (ref 0–44)
AST: 17 U/L (ref 15–41)
Albumin: 3.7 g/dL (ref 3.5–5.0)
Alkaline Phosphatase: 57 U/L (ref 38–126)
Anion gap: 6 (ref 5–15)
BUN: 26 mg/dL — ABNORMAL HIGH (ref 8–23)
CO2: 28 mmol/L (ref 22–32)
Calcium: 8.7 mg/dL — ABNORMAL LOW (ref 8.9–10.3)
Chloride: 105 mmol/L (ref 98–111)
Creatinine, Ser: 0.85 mg/dL (ref 0.61–1.24)
GFR, Estimated: >60 mL/min (ref >60)
Glucose, Bld: 132 mg/dL — ABNORMAL HIGH (ref 70–99)
Potassium: 3.8 mmol/L (ref 3.5–5.1)
Sodium: 139 mmol/L (ref 135–145)
Total Bilirubin: 1 mg/dL (ref 0.3–1.2)
Total Protein: 6.2 g/dL — ABNORMAL LOW (ref 6.5–8.1)

## 2023-03-12 MED ORDER — DEXAMETHASONE 4 MG PO TABS: ORAL_TABLET | ORAL | 6 refills | Status: DC

## 2023-03-12 NOTE — Progress Notes (Signed)
[facility] OFFICE PROGRESS NOTE  Patient Care Team: Ronnald Nian, MD as PCP - General (Family Medicine) Artis Delay, MD as Consulting Physician (Hematology and Oncology)  ASSESSMENT & PLAN:  Multiple myeloma without remission (HCC) So far, he tolerated treatment very well with no major side effects He will continue calcium with vitamin D and Zometa every 6 months, next due in September He will continue to take his anticoagulation therapy for DVT prophylaxis  Hypocalcemia Will proceed with treatment with additional oral calcium supplement at home He will continue calcium with vitamin D  No orders of the defined types were placed in this encounter.   All questions were answered. The patient knows to call the clinic with any problems, questions or concerns. The total time spent in the appointment was 20 minutes encounter with patients including review of chart and various tests results, discussions about plan of care and coordination of care plan   Artis Delay, MD 03/12/2023 11:22 AM  INTERVAL HISTORY: Please see below for problem oriented charting. he returns for treatment follow-up He tolerated Ninlaro and Pomalyst well We discussed dexamethasone taper He had recent dental check Denies new bone pain no recent infection  REVIEW OF SYSTEMS:   Constitutional: Denies fevers, chills or abnormal weight loss Eyes: Denies blurriness of vision Ears, nose, mouth, throat, and face: Denies mucositis or sore throat Respiratory: Denies cough, dyspnea or wheezes Cardiovascular: Denies palpitation, chest discomfort or lower extremity swelling Gastrointestinal:  Denies nausea, heartburn or change in bowel habits Skin: Denies abnormal skin rashes Lymphatics: Denies new lymphadenopathy or easy bruising Neurological:Denies numbness, tingling or new weaknesses Behavioral/Psych: Mood is stable, no new changes  All other systems were reviewed with the patient and are  negative.  I have reviewed the past medical history, past surgical history, social history and family history with the patient and they are unchanged from previous note.  ALLERGIES:  has No Known Allergies.  MEDICATIONS:  Current Outpatient Medications  Medication Sig Dispense Refill   acetaminophen (TYLENOL) 650 MG CR tablet Take 650 mg by mouth every 8 (eight) hours as needed for pain.     acyclovir (ZOVIRAX) 400 MG tablet Take 1 tablet (400 mg total) by mouth daily. 90 tablet 1   atorvastatin (LIPITOR) 20 MG tablet TAKE 1 TABLET(20 MG) BY MOUTH DAILY 90 tablet 1   calcium carbonate (TUMS - DOSED IN MG ELEMENTAL CALCIUM) 500 MG chewable tablet Chew 1-2 tablets by mouth daily as needed for heartburn.     Cholecalciferol (VITAMIN D) 50 MCG (2000 UT) tablet Take 2,000 Units by mouth daily.     dexamethasone (DECADRON) 4 MG tablet 2 tabs weekly 12 tablet 6   erythromycin ophthalmic ointment Place 1 Application into both eyes 3 (three) times daily. 3.5 g 0   folic acid (FOLVITE) 400 MCG tablet Take 400 mcg by mouth daily.     ixazomib citrate (NINLARO) 3 MG capsule Take 1 capsule (3 mg) by mouth weekly, 3 weeks on, 1 week off, repeat every 4 weeks. Take on an empty stomach 1hr before or 2hr after meals. 3 capsule 11   losartan-hydrochlorothiazide (HYZAAR) 100-12.5 MG tablet TAKE 1 TABLET BY MOUTH DAILY 90 tablet 1   Multiple Vitamin (MULTI-VITAMIN) tablet Take 1 tablet by mouth daily.     ondansetron (ZOFRAN) 8 MG tablet Take 1 tablet (8 mg total) by mouth 2 (two) times daily as needed (Nausea or vomiting). 30 tablet 1   oxyCODONE (ROXICODONE) 5 MG immediate release tablet  Take 1 tablet (5 mg total) by mouth every 6 (six) hours as needed for severe pain. 90 tablet 0   Polyethyl Glycol-Propyl Glycol (SYSTANE OP) Place 1 drop into both eyes daily as needed (dry eyes).     pomalidomide (POMALYST) 3 MG capsule Take 1 capsule (3mg ) by mouth daily for 21 days, rest 7 days, repeat every 28 days. 21  capsule 0   prochlorperazine (COMPAZINE) 10 MG tablet TAKE 1 TABLET(10 MG) BY MOUTH EVERY 6 HOURS AS NEEDED FOR NAUSEA OR VOMITING 60 tablet 1   rivaroxaban (XARELTO) 20 MG TABS tablet Take 1 tablet (20 mg total) by mouth daily with supper. 90 tablet 1   vitamin B-12 (CYANOCOBALAMIN) 1000 MCG tablet Take 1,000 mcg by mouth daily.     No current facility-administered medications for this visit.    SUMMARY OF ONCOLOGIC HISTORY: Oncology History  Multiple myeloma without remission (HCC)  03/20/2019 Imaging   1. Tumor involving the L5 and S1 and S2 segments of the spine as described with mass effects upon the left L4, L5, S1 and more distal left sacral nerves as described above. 2. Does the patient have a history of malignancy? 3. Multiple plasmacytomas and metastatic disease could give this appearance   03/27/2019 Pathology Results   Soft Tissue Needle Core Biopsy, sacral mass - PLASMABLASTIC NEOPLASM. - SEE COMMENT. Microscopic Comment The sections show needle core biopsy fragments of soft tissue densely infiltrated by a relatively monomorphic infiltrate of atypical plasmacytoid cells characterized by vesicular or partially clumped chromatin and prominent nucleoli. This is associated with scattered mitosis. A battery of immunohistochemical stains was performed and show that the atypical plasmacytoid cells are positive for CD138, CD43 and cytoplasmic kappa. There is also weak positivity for CD56 and partial variable positivity for cyclin D1. The atypical plasmacytoid cells are negative for cytoplasmic lambda, CD10, PAX5, CD79a, CD20, CD3, CD5, CD34, EBV (ISH) and mostly negative for LCA. The overall morphologic and histologic features are most compatible with a plasmablastic neoplasm. The differential diagnosis includes plasmablastic lymphoma and plasmablastic plasmacytoma/myeloma. Based on the overall phenotypic features and the clinical setting, plasmacytoma/myeloma is favored. Clinical correlation  and hematologic evaluation is recommended   03/27/2019 Procedure   Technically successful CT-guided biopsy of sacral soft tissue mass.   04/04/2019 Initial Diagnosis   Multiple myeloma without remission (HCC)   04/11/2019 PET scan   IMPRESSION: 1. Previously noted expansile lesions involving the L5 vertebra and sacrum are again noted and exhibit intense FDG uptake compatible with metabolically active disease. A third focus of increased uptake without corresponding CT abnormality is noted within the intertrochanteric portions of the proximal right femur. 2. Small lucent lesion within the T3 vertebra is noted without corresponding FDG uptake. 3. Asymmetric left bladder wall thickening, etiology indeterminate. 4. Aortic Atherosclerosis (ICD10-I70.0). Lad coronary artery calcification.   04/12/2019 Cancer Staging   Staging form: Plasma Cell Myeloma and Plasma Cell Disorders, AJCC 8th Edition - Clinical stage from 04/12/2019: Beta-2-microglobulin (mg/L): 2, Albumin (g/dL): 3.6, ISS: Stage I, High-risk cytogenetics: Unknown, LDH: Unknown - Signed by Artis Delay, MD on 04/12/2019   04/14/2019 Bone Marrow Biopsy   Bone Marrow, Aspirate,Biopsy, and Clot, right iliac bone - PLASMA CELL MYELOMA.   04/17/2019 - 07/11/2019 Chemotherapy   The patient had bortezemib, revlimid and dexamethasone for chemotherapy treatment.     08/17/2019 Bone Marrow Transplant   He received high dose melphalan followed by autologous stem cell transplant at [facility]   11/22/2019 Bone Marrow Biopsy   BONE  MARROW biopsy at [facility]:       Mild monoclonal plasmacytosis (approximately 2%), kappa light chain restricted.    11/22/2019 PET scan   [facility] at [facility] 1.  Similar size and appearance of mildly hypermetabolic lytic lesion in the L5 vertebral body and posterior elements.  2.  Otherwise, no substantial FDG uptake compared to background bone marrow in the additional osseous lucent lesions.    12/20/2019 - 05/09/2020  Radiation Therapy   Radiation Treatment Dates: 12/20/2019 through 01/08/2020 Site Technique Total Dose (Gy) Dose per Fx (Gy) Completed Fx Beam Energies  Lumbar Spine: Spine 3D 35/35 2.5 14/14 15X        02/29/2020 PET scan   1. Similar size and appearance of expansile lytic lesion in the L5 vertebral body and posterior elements with minimal FDG activity.  2. Otherwise, no additional osseous lucent lesions with FDG uptake above background bone marrow   05/31/2020 -  Chemotherapy   The patient had Revlimid for chemotherapy treatment.     11/25/2021 - 05/11/2022 Chemotherapy   Patient is on Treatment Plan : MYELOMA RELAPSED / REFRACTORY Daratumumab SQ + Bortezomib + Dexamethasone (DaraVd) q21d / Daratumumab SQ q28d      11/25/2021 - 10/29/2022 Chemotherapy   Patient is on Treatment Plan : MYELOMA RELAPSED / REFRACTORY Daratumumab SQ + Bortezomib + Dexamethasone (DaraVd) q21d / Daratumumab SQ q28d        PHYSICAL EXAMINATION: ECOG PERFORMANCE STATUS: 0 - Asymptomatic  Vitals:   03/12/23 1008  BP: 118/74  Pulse: 77  Resp: 18  Temp: 98.2 F (36.8 C)  SpO2: 98%   Filed Weights   03/12/23 1008  Weight: 247 lb 12.8 oz (112.4 kg)    GENERAL:alert, no distress and comfortable  NEURO: alert & oriented x 3 with fluent speech, no focal motor/sensory deficits  LABORATORY DATA:  I have reviewed the data as listed    Component Value Date/Time   NA 139 03/12/2023 0947   NA 139 10/11/2018 1115   NA 139 04/05/2014 0849   K 3.8 03/12/2023 0947   K 4.4 04/05/2014 0849   CL 105 03/12/2023 0947   CO2 28 03/12/2023 0947   CO2 23 04/05/2014 0849   GLUCOSE 132 (H) 03/12/2023 0947   GLUCOSE 109 04/05/2014 0849   BUN 26 (H) 03/12/2023 0947   BUN 23 10/11/2018 1115   BUN 16.8 04/05/2014 0849   CREATININE 0.85 03/12/2023 0947   CREATININE 0.80 10/29/2022 1046   CREATININE 0.91 07/29/2017 1004   CREATININE 0.9 04/05/2014 0849   CALCIUM 8.7 (L) 03/12/2023 0947   CALCIUM 8.7 04/05/2014 0849    PROT 6.2 (L) 03/12/2023 0947   PROT 7.2 10/11/2018 1115   PROT 6.7 04/05/2014 0849   ALBUMIN 3.7 03/12/2023 0947   ALBUMIN 4.7 10/11/2018 1115   ALBUMIN 3.8 04/05/2014 0849   AST 17 03/12/2023 0947   AST 24 10/29/2022 1046   AST 26 04/05/2014 0849   ALT 29 03/12/2023 0947   ALT 31 10/29/2022 1046   ALT 30 04/05/2014 0849   ALKPHOS 57 03/12/2023 0947   ALKPHOS 75 04/05/2014 0849   BILITOT 1.0 03/12/2023 0947   BILITOT 0.9 10/29/2022 1046   BILITOT 0.71 04/05/2014 0849   GFRNONAA >60 03/12/2023 0947   GFRNONAA >60 10/29/2022 1046   GFRAA >60 06/14/2020 0830    No results found for: "SPEP", "UPEP"  Lab Results  Component Value Date   WBC 6.5 03/12/2023   NEUTROABS 3.1 03/12/2023   HGB  14.2 03/12/2023   HCT 43.1 03/12/2023   MCV 103.6 (H) 03/12/2023   PLT 173 03/12/2023      Chemistry      Component Value Date/Time   NA 139 03/12/2023 0947   NA 139 10/11/2018 1115   NA 139 04/05/2014 0849   K 3.8 03/12/2023 0947   K 4.4 04/05/2014 0849   CL 105 03/12/2023 0947   CO2 28 03/12/2023 0947   CO2 23 04/05/2014 0849   BUN 26 (H) 03/12/2023 0947   BUN 23 10/11/2018 1115   BUN 16.8 04/05/2014 0849   CREATININE 0.85 03/12/2023 0947   CREATININE 0.80 10/29/2022 1046   CREATININE 0.91 07/29/2017 1004   CREATININE 0.9 04/05/2014 0849   GLU 10 04/10/2014 0000      Component Value Date/Time   CALCIUM 8.7 (L) 03/12/2023 0947   CALCIUM 8.7 04/05/2014 0849   ALKPHOS 57 03/12/2023 0947   ALKPHOS 75 04/05/2014 0849   AST 17 03/12/2023 0947   AST 24 10/29/2022 1046   AST 26 04/05/2014 0849   ALT 29 03/12/2023 0947   ALT 31 10/29/2022 1046   ALT 30 04/05/2014 0849   BILITOT 1.0 03/12/2023 0947   BILITOT 0.9 10/29/2022 1046   BILITOT 0.71 04/05/2014 0849

## 2023-03-12 NOTE — Assessment & Plan Note (Signed)
Will proceed with treatment with additional oral calcium supplement at home He will continue calcium with vitamin D

## 2023-03-12 NOTE — Assessment & Plan Note (Signed)
So far, he tolerated treatment very well with no major side effects He will continue calcium with vitamin D and Zometa every 6 months, next due in September He will continue to take his anticoagulation therapy for DVT prophylaxis

## 2023-03-15 LAB — KAPPA/LAMBDA LIGHT CHAINS
Kappa free light chain: 23.1 mg/L — ABNORMAL HIGH (ref 3.3–19.4)
Kappa, lambda light chain ratio: 1.36 (ref 0.26–1.65)
Lambda free light chains: 17 mg/L (ref 5.7–26.3)

## 2023-03-19 LAB — MULTIPLE MYELOMA PANEL, SERUM
Albumin SerPl Elph-Mcnc: 3.1 g/dL (ref 2.9–4.4)
Albumin/Glob SerPl: 1.3 (ref 0.7–1.7)
Alpha 1: 0.2 g/dL (ref 0.0–0.4)
Alpha2 Glob SerPl Elph-Mcnc: 0.8 g/dL (ref 0.4–1.0)
B-Globulin SerPl Elph-Mcnc: 0.8 g/dL (ref 0.7–1.3)
Gamma Glob SerPl Elph-Mcnc: 0.6 g/dL (ref 0.4–1.8)
Globulin, Total: 2.4 g/dL (ref 2.2–3.9)
IgA: 46 mg/dL — ABNORMAL LOW (ref 61–437)
IgG (Immunoglobin G), Serum: 741 mg/dL (ref 603–1613)
IgM (Immunoglobulin M), Srm: 46 mg/dL (ref 15–143)
M Protein SerPl Elph-Mcnc: 0.3 g/dL — ABNORMAL HIGH
Total Protein ELP: 5.5 g/dL — ABNORMAL LOW (ref 6.0–8.5)

## 2023-03-26 ENCOUNTER — Other Ambulatory Visit: Payer: Self-pay

## 2023-03-26 DIAGNOSIS — C9000 Multiple myeloma not having achieved remission: Secondary | ICD-10-CM

## 2023-03-26 MED ORDER — POMALIDOMIDE 3 MG PO CAPS
ORAL_CAPSULE | ORAL | 0 refills | Status: DC
Start: 2023-03-26 — End: 2023-04-23

## 2023-04-14 DIAGNOSIS — L570 Actinic keratosis: Secondary | ICD-10-CM | POA: Diagnosis not present

## 2023-04-14 DIAGNOSIS — D045 Carcinoma in situ of skin of trunk: Secondary | ICD-10-CM | POA: Diagnosis not present

## 2023-04-14 DIAGNOSIS — L821 Other seborrheic keratosis: Secondary | ICD-10-CM | POA: Diagnosis not present

## 2023-04-14 DIAGNOSIS — D039 Melanoma in situ, unspecified: Secondary | ICD-10-CM | POA: Insufficient documentation

## 2023-04-14 DIAGNOSIS — L820 Inflamed seborrheic keratosis: Secondary | ICD-10-CM | POA: Diagnosis not present

## 2023-04-14 DIAGNOSIS — D485 Neoplasm of uncertain behavior of skin: Secondary | ICD-10-CM | POA: Diagnosis not present

## 2023-04-14 DIAGNOSIS — D099 Carcinoma in situ, unspecified: Secondary | ICD-10-CM | POA: Insufficient documentation

## 2023-04-14 DIAGNOSIS — D0339 Melanoma in situ of other parts of face: Secondary | ICD-10-CM | POA: Diagnosis not present

## 2023-04-23 ENCOUNTER — Other Ambulatory Visit: Payer: Self-pay

## 2023-04-23 ENCOUNTER — Inpatient Hospital Stay: Payer: Medicare Other | Attending: Hematology and Oncology

## 2023-04-23 ENCOUNTER — Encounter: Payer: Self-pay | Admitting: Hematology and Oncology

## 2023-04-23 ENCOUNTER — Inpatient Hospital Stay (HOSPITAL_BASED_OUTPATIENT_CLINIC_OR_DEPARTMENT_OTHER): Payer: Medicare Other | Admitting: Hematology and Oncology

## 2023-04-23 VITALS — BP 149/61 | HR 102 | Resp 18 | Ht 69.0 in | Wt 258.2 lb

## 2023-04-23 DIAGNOSIS — I2510 Atherosclerotic heart disease of native coronary artery without angina pectoris: Secondary | ICD-10-CM | POA: Diagnosis not present

## 2023-04-23 DIAGNOSIS — Z7901 Long term (current) use of anticoagulants: Secondary | ICD-10-CM | POA: Insufficient documentation

## 2023-04-23 DIAGNOSIS — M533 Sacrococcygeal disorders, not elsewhere classified: Secondary | ICD-10-CM | POA: Insufficient documentation

## 2023-04-23 DIAGNOSIS — Z7961 Long term (current) use of immunomodulator: Secondary | ICD-10-CM | POA: Insufficient documentation

## 2023-04-23 DIAGNOSIS — Z86711 Personal history of pulmonary embolism: Secondary | ICD-10-CM

## 2023-04-23 DIAGNOSIS — C9000 Multiple myeloma not having achieved remission: Secondary | ICD-10-CM

## 2023-04-23 DIAGNOSIS — Z79899 Other long term (current) drug therapy: Secondary | ICD-10-CM | POA: Insufficient documentation

## 2023-04-23 DIAGNOSIS — R2689 Other abnormalities of gait and mobility: Secondary | ICD-10-CM | POA: Diagnosis not present

## 2023-04-23 DIAGNOSIS — D72819 Decreased white blood cell count, unspecified: Secondary | ICD-10-CM | POA: Diagnosis not present

## 2023-04-23 DIAGNOSIS — T451X5A Adverse effect of antineoplastic and immunosuppressive drugs, initial encounter: Secondary | ICD-10-CM | POA: Diagnosis not present

## 2023-04-23 DIAGNOSIS — I700 Atherosclerosis of aorta: Secondary | ICD-10-CM | POA: Insufficient documentation

## 2023-04-23 DIAGNOSIS — D701 Agranulocytosis secondary to cancer chemotherapy: Secondary | ICD-10-CM | POA: Diagnosis not present

## 2023-04-23 DIAGNOSIS — G252 Other specified forms of tremor: Secondary | ICD-10-CM | POA: Diagnosis not present

## 2023-04-23 DIAGNOSIS — D6481 Anemia due to antineoplastic chemotherapy: Secondary | ICD-10-CM | POA: Diagnosis not present

## 2023-04-23 DIAGNOSIS — Z79624 Long term (current) use of inhibitors of nucleotide synthesis: Secondary | ICD-10-CM | POA: Insufficient documentation

## 2023-04-23 LAB — CBC WITH DIFFERENTIAL/PLATELET
Abs Immature Granulocytes: 0.01 10*3/uL (ref 0.00–0.07)
Basophils Absolute: 0 10*3/uL (ref 0.0–0.1)
Basophils Relative: 1 %
Eosinophils Absolute: 0.1 10*3/uL (ref 0.0–0.5)
Eosinophils Relative: 3 %
HCT: 40.1 % (ref 39.0–52.0)
Hemoglobin: 13.7 g/dL (ref 13.0–17.0)
Immature Granulocytes: 0 %
Lymphocytes Relative: 49 %
Lymphs Abs: 1.6 10*3/uL (ref 0.7–4.0)
MCH: 34.9 pg — ABNORMAL HIGH (ref 26.0–34.0)
MCHC: 34.2 g/dL (ref 30.0–36.0)
MCV: 102 fL — ABNORMAL HIGH (ref 80.0–100.0)
Monocytes Absolute: 0.6 10*3/uL (ref 0.1–1.0)
Monocytes Relative: 17 %
Neutro Abs: 1 10*3/uL — ABNORMAL LOW (ref 1.7–7.7)
Neutrophils Relative %: 30 %
Platelets: 153 10*3/uL (ref 150–400)
RBC: 3.93 MIL/uL — ABNORMAL LOW (ref 4.22–5.81)
RDW: 14.4 % (ref 11.5–15.5)
WBC: 3.3 10*3/uL — ABNORMAL LOW (ref 4.0–10.5)
nRBC: 0 % (ref 0.0–0.2)

## 2023-04-23 LAB — COMPREHENSIVE METABOLIC PANEL
ALT: 29 U/L (ref 0–44)
AST: 23 U/L (ref 15–41)
Albumin: 3.6 g/dL (ref 3.5–5.0)
Alkaline Phosphatase: 51 U/L (ref 38–126)
Anion gap: 5 (ref 5–15)
BUN: 17 mg/dL (ref 8–23)
CO2: 30 mmol/L (ref 22–32)
Calcium: 8.7 mg/dL — ABNORMAL LOW (ref 8.9–10.3)
Chloride: 107 mmol/L (ref 98–111)
Creatinine, Ser: 0.79 mg/dL (ref 0.61–1.24)
GFR, Estimated: >60 mL/min (ref >60)
Glucose, Bld: 126 mg/dL — ABNORMAL HIGH (ref 70–99)
Potassium: 3.6 mmol/L (ref 3.5–5.1)
Sodium: 142 mmol/L (ref 135–145)
Total Bilirubin: 0.8 mg/dL (ref 0.3–1.2)
Total Protein: 6.1 g/dL — ABNORMAL LOW (ref 6.5–8.1)

## 2023-04-23 MED ORDER — POMALIDOMIDE 3 MG PO CAPS
ORAL_CAPSULE | ORAL | 0 refills | Status: DC
Start: 2023-04-23 — End: 2023-04-23

## 2023-04-23 MED ORDER — RIVAROXABAN 10 MG PO TABS: 10.0000 mg | ORAL_TABLET | Freq: Every day | ORAL | 1 refills | Status: DC

## 2023-04-23 MED ORDER — POMALIDOMIDE 3 MG PO CAPS
ORAL_CAPSULE | ORAL | 0 refills | Status: DC
Start: 2023-04-23 — End: 2023-05-26

## 2023-04-23 NOTE — Assessment & Plan Note (Signed)
So far, he tolerated treatment very well with no major side effects He will continue calcium with vitamin D and Zometa every 6 months, next due in September He will continue to take his anticoagulation therapy for DVT prophylaxis

## 2023-04-23 NOTE — Assessment & Plan Note (Signed)
He has history of tremor but worse and now have balance issue I recommend neuro-oncology consult and he agrees

## 2023-04-23 NOTE — Progress Notes (Signed)
Received a notification that escribed Pomalyst failed. Faxed prescription to biologics at [phone], received fax confirmation.

## 2023-04-23 NOTE — Assessment & Plan Note (Signed)
I am concerned about fall risks I recommend PT eval

## 2023-04-23 NOTE — Progress Notes (Signed)
[facility] OFFICE PROGRESS NOTE  Patient Care Team: Ronnald Nian, MD as PCP - General (Family Medicine) Artis Delay, MD as Consulting Physician (Hematology and Oncology)  ASSESSMENT & PLAN:  Multiple myeloma without remission (HCC) So far, he tolerated treatment very well with no major side effects He will continue calcium with vitamin D and Zometa every 6 months, next due in September He will continue to take his anticoagulation therapy for DVT prophylaxis  Leukopenia due to antineoplastic chemotherapy [facility]) He has history of mild intermittent leukopenia Observe closely  Intention tremor He has history of tremor but worse and now have balance issue I recommend neuro-oncology consult and he agrees  Balance disorder I am concerned about fall risks I recommend PT eval  History of pulmonary embolus (PE) Patient prior PE was provoked He will complete a years worth of anticoagulation therapy this month  However he is at risk due to pomalyst I recommend secondary prevention with reduced dose xarelto  Orders Placed This Encounter  Procedures   Amb Referral to Neuro Oncology    Referral Priority:   Routine    Referral Type:   Consultation    Referral Reason:   Specialty Services Required    Requested Specialty:   Oncology    Number of Visits Requested:   1   Ambulatory referral to Physical Therapy    Referral Priority:   Routine    Referral Type:   Physical Medicine    Referral Reason:   Specialty Services Required    Requested Specialty:   Physical Therapy    Number of Visits Requested:   1    All questions were answered. The patient knows to call the clinic with any problems, questions or concerns. The total time spent in the appointment was 40 minutes encounter with patients including review of chart and various tests results, discussions about plan of care and coordination of care plan   Artis Delay, MD 04/23/2023 1:02 PM  INTERVAL HISTORY: Please  see below for problem oriented charting. he returns for treatment follow-up He has concern about balance issue and tremor He is not able to exercise and has gained weight Denies worsening bone pain No recent infection  REVIEW OF SYSTEMS:   Constitutional: Denies fevers, chills or abnormal weight loss Eyes: Denies blurriness of vision Ears, nose, mouth, throat, and face: Denies mucositis or sore throat Respiratory: Denies cough, dyspnea or wheezes Cardiovascular: Denies palpitation, chest discomfort or lower extremity swelling Gastrointestinal:  Denies nausea, heartburn or change in bowel habits Skin: Denies abnormal skin rashes Lymphatics: Denies new lymphadenopathy or easy bruising Behavioral/Psych: Mood is stable, no new changes  All other systems were reviewed with the patient and are negative.  I have reviewed the past medical history, past surgical history, social history and family history with the patient and they are unchanged from previous note.  ALLERGIES:  has No Known Allergies.  MEDICATIONS:  Current Outpatient Medications  Medication Sig Dispense Refill   acetaminophen (TYLENOL) 650 MG CR tablet Take 650 mg by mouth every 8 (eight) hours as needed for pain.     acyclovir (ZOVIRAX) 400 MG tablet Take 1 tablet (400 mg total) by mouth daily. 90 tablet 1   atorvastatin (LIPITOR) 20 MG tablet TAKE 1 TABLET(20 MG) BY MOUTH DAILY 90 tablet 1   calcium carbonate (TUMS - DOSED IN MG ELEMENTAL CALCIUM) 500 MG chewable tablet Chew 1 tablet by mouth 2 (two) times daily.     Cholecalciferol (VITAMIN D)  50 MCG (2000 UT) tablet Take 2,000 Units by mouth daily.     dexamethasone (DECADRON) 4 MG tablet 2 tabs weekly 12 tablet 6   erythromycin ophthalmic ointment Place 1 Application into both eyes 3 (three) times daily. 3.5 g 0   folic acid (FOLVITE) 400 MCG tablet Take 400 mcg by mouth daily.     ixazomib citrate (NINLARO) 3 MG capsule Take 1 capsule (3 mg) by mouth weekly, 3 weeks on,  1 week off, repeat every 4 weeks. Take on an empty stomach 1hr before or 2hr after meals. 3 capsule 11   losartan-hydrochlorothiazide (HYZAAR) 100-12.5 MG tablet TAKE 1 TABLET BY MOUTH DAILY 90 tablet 1   Multiple Vitamin (MULTI-VITAMIN) tablet Take 1 tablet by mouth daily.     ondansetron (ZOFRAN) 8 MG tablet Take 1 tablet (8 mg total) by mouth 2 (two) times daily as needed (Nausea or vomiting). 30 tablet 1   oxyCODONE (ROXICODONE) 5 MG immediate release tablet Take 1 tablet (5 mg total) by mouth every 6 (six) hours as needed for severe pain. 90 tablet 0   Polyethyl Glycol-Propyl Glycol (SYSTANE OP) Place 1 drop into both eyes daily as needed (dry eyes).     pomalidomide (POMALYST) 3 MG capsule Take 1 capsule (3mg ) by mouth daily for 21 days, rest 7 days, repeat every 28 days. 21 capsule 0   prochlorperazine (COMPAZINE) 10 MG tablet TAKE 1 TABLET(10 MG) BY MOUTH EVERY 6 HOURS AS NEEDED FOR NAUSEA OR VOMITING 60 tablet 1   rivaroxaban (XARELTO) 10 MG TABS tablet Take 1 tablet (10 mg total) by mouth daily with supper. 90 tablet 1   vitamin B-12 (CYANOCOBALAMIN) 1000 MCG tablet Take 1,000 mcg by mouth daily.     No current facility-administered medications for this visit.    SUMMARY OF ONCOLOGIC HISTORY: Oncology History  Multiple myeloma without remission (HCC)  03/20/2019 Imaging   1. Tumor involving the L5 and S1 and S2 segments of the spine as described with mass effects upon the left L4, L5, S1 and more distal left sacral nerves as described above. 2. Does the patient have a history of malignancy? 3. Multiple plasmacytomas and metastatic disease could give this appearance   03/27/2019 Pathology Results   Soft Tissue Needle Core Biopsy, sacral mass - PLASMABLASTIC NEOPLASM. - SEE COMMENT. Microscopic Comment The sections show needle core biopsy fragments of soft tissue densely infiltrated by a relatively monomorphic infiltrate of atypical plasmacytoid cells characterized by vesicular or  partially clumped chromatin and prominent nucleoli. This is associated with scattered mitosis. A battery of immunohistochemical stains was performed and show that the atypical plasmacytoid cells are positive for CD138, CD43 and cytoplasmic kappa. There is also weak positivity for CD56 and partial variable positivity for cyclin D1. The atypical plasmacytoid cells are negative for cytoplasmic lambda, CD10, PAX5, CD79a, CD20, CD3, CD5, CD34, EBV (ISH) and mostly negative for LCA. The overall morphologic and histologic features are most compatible with a plasmablastic neoplasm. The differential diagnosis includes plasmablastic lymphoma and plasmablastic plasmacytoma/myeloma. Based on the overall phenotypic features and the clinical setting, plasmacytoma/myeloma is favored. Clinical correlation and hematologic evaluation is recommended   03/27/2019 Procedure   Technically successful CT-guided biopsy of sacral soft tissue mass.   04/04/2019 Initial Diagnosis   Multiple myeloma without remission (HCC)   04/11/2019 PET scan   IMPRESSION: 1. Previously noted expansile lesions involving the L5 vertebra and sacrum are again noted and exhibit intense FDG uptake compatible with metabolically active disease. A third  focus of increased uptake without corresponding CT abnormality is noted within the intertrochanteric portions of the proximal right femur. 2. Small lucent lesion within the T3 vertebra is noted without corresponding FDG uptake. 3. Asymmetric left bladder wall thickening, etiology indeterminate. 4. Aortic Atherosclerosis (ICD10-I70.0). Lad coronary artery calcification.   04/12/2019 Cancer Staging   Staging form: Plasma Cell Myeloma and Plasma Cell Disorders, AJCC 8th Edition - Clinical stage from 04/12/2019: Beta-2-microglobulin (mg/L): 2, Albumin (g/dL): 3.6, ISS: Stage I, High-risk cytogenetics: Unknown, LDH: Unknown - Signed by Artis Delay, MD on 04/12/2019   04/14/2019 Bone Marrow Biopsy   Bone Marrow,  Aspirate,Biopsy, and Clot, right iliac bone - PLASMA CELL MYELOMA.   04/17/2019 - 07/11/2019 Chemotherapy   The patient had bortezemib, revlimid and dexamethasone for chemotherapy treatment.     08/17/2019 Bone Marrow Transplant   He received high dose melphalan followed by autologous stem cell transplant at [facility]   11/22/2019 Bone Marrow Biopsy   BONE MARROW biopsy at [facility]:       Mild monoclonal plasmacytosis (approximately 2%), kappa light chain restricted.    11/22/2019 PET scan   PET CT at [facility] 1.  Similar size and appearance of mildly hypermetabolic lytic lesion in the L5 vertebral body and posterior elements.  2.  Otherwise, no substantial FDG uptake compared to background bone marrow in the additional osseous lucent lesions.    12/20/2019 - 05/09/2020 Radiation Therapy   Radiation Treatment Dates: 12/20/2019 through 01/08/2020 Site Technique Total Dose (Gy) Dose per Fx (Gy) Completed Fx Beam Energies  Lumbar Spine: Spine 3D 35/35 2.5 14/14 15X        02/29/2020 PET scan   1. Similar size and appearance of expansile lytic lesion in the L5 vertebral body and posterior elements with minimal FDG activity.  2. Otherwise, no additional osseous lucent lesions with FDG uptake above background bone marrow   05/31/2020 -  Chemotherapy   The patient had Revlimid for chemotherapy treatment.     11/25/2021 - 05/11/2022 Chemotherapy   Patient is on Treatment Plan : MYELOMA RELAPSED / REFRACTORY Daratumumab SQ + Bortezomib + Dexamethasone (DaraVd) q21d / Daratumumab SQ q28d      11/25/2021 - 10/29/2022 Chemotherapy   Patient is on Treatment Plan : MYELOMA RELAPSED / REFRACTORY Daratumumab SQ + Bortezomib + Dexamethasone (DaraVd) q21d / Daratumumab SQ q28d        PHYSICAL EXAMINATION: ECOG PERFORMANCE STATUS: 1 - Symptomatic but completely ambulatory  Vitals:   04/23/23 1008  BP: (!) 149/61  Pulse: (!) 102  Resp: 18  SpO2: 96%   Filed Weights   04/23/23 1008  Weight:  258 lb 3.2 oz (117.1 kg)    GENERAL:alert, no distress and comfortable SKIN: skin color, texture, turgor are normal, no rashes or significant lesions EYES: normal, Conjunctiva are pink and non-injected, sclera clear OROPHARYNX:no exudate, no erythema and lips, buccal mucosa, and tongue normal  NECK: supple, thyroid normal size, non-tender, without nodularity LYMPH:  no palpable lymphadenopathy in the cervical, axillary or inguinal LUNGS: clear to auscultation and percussion with normal breathing effort HEART: regular rate & rhythm and no murmurs and no lower extremity edema ABDOMEN:abdomen soft, non-tender and normal bowel sounds Musculoskeletal:no cyanosis of digits and no clubbing  NEURO: alert & oriented x 3 with fluent speech, he has profound intention tremor  LABORATORY DATA:  I have reviewed the data as listed    Component Value Date/Time   NA 142 04/23/2023 0934   NA 139 10/11/2018 1115  NA 139 04/05/2014 0849   K 3.6 04/23/2023 0934   K 4.4 04/05/2014 0849   CL 107 04/23/2023 0934   CO2 30 04/23/2023 0934   CO2 23 04/05/2014 0849   GLUCOSE 126 (H) 04/23/2023 0934   GLUCOSE 109 04/05/2014 0849   BUN 17 04/23/2023 0934   BUN 23 10/11/2018 1115   BUN 16.8 04/05/2014 0849   CREATININE 0.79 04/23/2023 0934   CREATININE 0.80 10/29/2022 1046   CREATININE 0.91 07/29/2017 1004   CREATININE 0.9 04/05/2014 0849   CALCIUM 8.7 (L) 04/23/2023 0934   CALCIUM 8.7 04/05/2014 0849   PROT 6.1 (L) 04/23/2023 0934   PROT 7.2 10/11/2018 1115   PROT 6.7 04/05/2014 0849   ALBUMIN 3.6 04/23/2023 0934   ALBUMIN 4.7 10/11/2018 1115   ALBUMIN 3.8 04/05/2014 0849   AST 23 04/23/2023 0934   AST 24 10/29/2022 1046   AST 26 04/05/2014 0849   ALT 29 04/23/2023 0934   ALT 31 10/29/2022 1046   ALT 30 04/05/2014 0849   ALKPHOS 51 04/23/2023 0934   ALKPHOS 75 04/05/2014 0849   BILITOT 0.8 04/23/2023 0934   BILITOT 0.9 10/29/2022 1046   BILITOT 0.71 04/05/2014 0849   GFRNONAA >60 04/23/2023  0934   GFRNONAA >60 10/29/2022 1046   GFRAA >60 06/14/2020 0830    No results found for: "SPEP", "UPEP"  Lab Results  Component Value Date   WBC 3.3 (L) 04/23/2023   NEUTROABS 1.0 (L) 04/23/2023   HGB 13.7 04/23/2023   HCT 40.1 04/23/2023   MCV 102.0 (H) 04/23/2023   PLT 153 04/23/2023      Chemistry      Component Value Date/Time   NA 142 04/23/2023 0934   NA 139 10/11/2018 1115   NA 139 04/05/2014 0849   K 3.6 04/23/2023 0934   K 4.4 04/05/2014 0849   CL 107 04/23/2023 0934   CO2 30 04/23/2023 0934   CO2 23 04/05/2014 0849   BUN 17 04/23/2023 0934   BUN 23 10/11/2018 1115   BUN 16.8 04/05/2014 0849   CREATININE 0.79 04/23/2023 0934   CREATININE 0.80 10/29/2022 1046   CREATININE 0.91 07/29/2017 1004   CREATININE 0.9 04/05/2014 0849   GLU 10 04/10/2014 0000      Component Value Date/Time   CALCIUM 8.7 (L) 04/23/2023 0934   CALCIUM 8.7 04/05/2014 0849   ALKPHOS 51 04/23/2023 0934   ALKPHOS 75 04/05/2014 0849   AST 23 04/23/2023 0934   AST 24 10/29/2022 1046   AST 26 04/05/2014 0849   ALT 29 04/23/2023 0934   ALT 31 10/29/2022 1046   ALT 30 04/05/2014 0849   BILITOT 0.8 04/23/2023 0934   BILITOT 0.9 10/29/2022 1046   BILITOT 0.71 04/05/2014 0849

## 2023-04-23 NOTE — Assessment & Plan Note (Signed)
He has history of mild intermittent leukopenia Observe closely

## 2023-04-23 NOTE — Assessment & Plan Note (Signed)
Patient prior PE was provoked He will complete a years worth of anticoagulation therapy this month  However he is at risk due to pomalyst I recommend secondary prevention with reduced dose xarelto

## 2023-04-24 ENCOUNTER — Other Ambulatory Visit: Payer: Self-pay | Admitting: Family Medicine

## 2023-04-24 DIAGNOSIS — E785 Hyperlipidemia, unspecified: Secondary | ICD-10-CM

## 2023-04-26 LAB — KAPPA/LAMBDA LIGHT CHAINS
Kappa free light chain: 33.3 mg/L — ABNORMAL HIGH (ref 3.3–19.4)
Kappa, lambda light chain ratio: 1.45 (ref 0.26–1.65)
Lambda free light chains: 23 mg/L (ref 5.7–26.3)

## 2023-04-27 ENCOUNTER — Telehealth: Payer: Self-pay | Admitting: Family Medicine

## 2023-04-27 DIAGNOSIS — E785 Hyperlipidemia, unspecified: Secondary | ICD-10-CM

## 2023-04-27 MED ORDER — ATORVASTATIN CALCIUM 20 MG PO TABS
20.0000 mg | ORAL_TABLET | Freq: Every day | ORAL | 0 refills | Status: DC
Start: 2023-04-27 — End: 2023-08-02

## 2023-04-27 NOTE — Telephone Encounter (Signed)
sent 

## 2023-04-27 NOTE — Telephone Encounter (Signed)
Pt needs his atorvastatin sent to  [id number] - [place], [place] - [address] AT [facility] [address] & [facility] [address]

## 2023-04-28 LAB — MULTIPLE MYELOMA PANEL, SERUM
Albumin SerPl Elph-Mcnc: 3.2 g/dL (ref 2.9–4.4)
Albumin/Glob SerPl: 1.4 (ref 0.7–1.7)
Alpha 1: 0.2 g/dL (ref 0.0–0.4)
Alpha2 Glob SerPl Elph-Mcnc: 0.7 g/dL (ref 0.4–1.0)
B-Globulin SerPl Elph-Mcnc: 0.8 g/dL (ref 0.7–1.3)
Gamma Glob SerPl Elph-Mcnc: 0.7 g/dL (ref 0.4–1.8)
Globulin, Total: 2.4 g/dL (ref 2.2–3.9)
IgA: 66 mg/dL (ref 61–437)
IgG (Immunoglobin G), Serum: 846 mg/dL (ref 603–1613)
IgM (Immunoglobulin M), Srm: 37 mg/dL (ref 15–143)
M Protein SerPl Elph-Mcnc: 0.3 g/dL — ABNORMAL HIGH
Total Protein ELP: 5.6 g/dL — ABNORMAL LOW (ref 6.0–8.5)

## 2023-04-29 ENCOUNTER — Encounter: Payer: Self-pay | Admitting: Physical Therapy

## 2023-04-29 ENCOUNTER — Other Ambulatory Visit: Payer: Self-pay

## 2023-04-29 ENCOUNTER — Ambulatory Visit: Payer: Medicare Other | Attending: Hematology and Oncology | Admitting: Physical Therapy

## 2023-04-29 DIAGNOSIS — R262 Difficulty in walking, not elsewhere classified: Secondary | ICD-10-CM | POA: Diagnosis not present

## 2023-04-29 DIAGNOSIS — M6281 Muscle weakness (generalized): Secondary | ICD-10-CM | POA: Diagnosis not present

## 2023-04-29 DIAGNOSIS — R2689 Other abnormalities of gait and mobility: Secondary | ICD-10-CM | POA: Insufficient documentation

## 2023-04-29 DIAGNOSIS — G252 Other specified forms of tremor: Secondary | ICD-10-CM | POA: Diagnosis not present

## 2023-04-29 NOTE — Therapy (Addendum)
OUTPATIENT PHYSICAL THERAPY NEURO EVALUATION   Patient Name: Jesse Mcclure. MRN: [id number] DOB:August 08, 1951, 72 y.o., adult Today's Date: 04/29/2023   PCP: Sharlot Gowda MD REFERRING PROVIDER: Artis Delay MD  END OF SESSION:  PT End of Session - 04/29/23 1229     Visit Number 1    Date for PT Re-Evaluation 06/24/23    Authorization Type Medicare    Progress Note Due on Visit 10    PT Start Time 1232    PT Stop Time 1315    PT Time Calculation (min) 43 min    Activity Tolerance Patient tolerated treatment well             Past Medical History:  Diagnosis Date   BPH (benign prostatic hypertrophy)    Degenerative disc disease    Diverticulosis    Hemochromatosis    HTN (hypertension)    Hyperlipidemia    Multiple myeloma (HCC)    Polio    Status post Nissen fundoplication (without gastrostomy tube) procedure    for hiatal hernia.   Past Surgical History:  Procedure Laterality Date   FOOT SURGERY     HIATAL HERNIA REPAIR     OTHER SURGICAL HISTORY     Cataract Surgery--Both eyes   SKIN BIOPSY Right 04/13/2022   squamous cell carcinoma in stiu arising in actinic keratosis   TOTAL KNEE ARTHROPLASTY Left 07/15/2021   Procedure: TOTAL KNEE ARTHROPLASTY;  Surgeon: Teryl Lucy, MD;  Location: [facility];  Service: Orthopedics;  Laterality: Left;   Undescended testes Right 10/05/1966   Patient Active Problem List   Diagnosis Date Noted   Intention tremor 04/23/2023   Balance disorder 04/23/2023   Squamous cell carcinoma in situ 04/14/2023   Melanoma in situ (HCC) 04/14/2023   History of pulmonary embolus (PE) 09/04/2022   History of basal cell cancer 05/01/2022   First degree AV block 04/22/2022   History of skin cancer 04/21/2022   Thrombocytopenia (HCC) 12/23/2021   S/P TKR (total knee replacement), left 07/15/2021   Osteoarthritis of left knee 05/22/2021   Deficiency anemia 01/07/2021   ACE-inhibitor cough 12/16/2020   Leukopenia due to antineoplastic  chemotherapy (HCC) 07/22/2020   Tremor of both hands 10/11/2019   Peripheral neuropathy due to chemotherapy (HCC) 10/11/2019   Dry skin 10/11/2019   Insomnia disorder 09/06/2019   Loose stools 09/06/2019   S/P bone marrow transplant (HCC) 08/30/2019   Pancytopenia, acquired (HCC) 08/30/2019   Hypocalcemia 06/14/2019   Goals of care, counseling/discussion 04/12/2019   Multiple myeloma without remission (HCC) 04/04/2019   Other constipation 04/04/2019   Glucose intolerance (impaired glucose tolerance) 05/20/2016   Vitamin D deficiency 05/20/2016   Chronic musculoskeletal pain 01/09/2016   History of orchiectomy, unilateral 05/20/2015   Obesity (BMI 30-39.9) 05/20/2015   Elevated PSA 05/20/2015   Arthritis 05/20/2015   Essential hypertension 03/02/2012   Hyperlipidemia 03/02/2012   Hemochromatosis, hereditary (HCC) 11/28/2008    ONSET DATE: > 1 year  REFERRING DIAG: G25.2 intention tremor; R26.89 balance disorder  THERAPY DIAG: difficulty in walking; fall risk Muscle weakness (generalized)  Difficulty in walking, not elsewhere classified  Rationale for Evaluation and Treatment: Rehabilitation  SUBJECTIVE:  SUBJECTIVE STATEMENT: Being treated for cancer 4-5 years including oral chemo at home;  Dr wants me to walk and I use poles b/c I'm very wobbly.  Dr. says it's not the medication causing this.  Went on cruise in [place] a few months ago but I couldn't tour and stayed on the boat. No falls but several close calls: 3-4 steps garage steps into the house, stumbled going up.  If I sit a while then get up and take a few steps, will feel wobbly.  Hold onto something to pick up something from the floor. Denies loss of sensation in feet. I sleep a lot.  I'm very sedentary.   My partner thinks I need to  move more.  I ride the stationary bike Tuesday/Thursday occasionally.  I used to have a trainer prior to getting sick. I get wheelchair service at airports. [place] trip in May  Goes by [name]  PERTINENT HISTORY:  Oncology History  Multiple myeloma without remission (HCC)  03/20/2019 Imaging    1. Tumor involving the L5 and S1 and S2 segments of the spine as described with mass effects upon the left L4, L5, S1 and more distal left sacral nerves   Left TKR HTN Back pain  PAIN:  PAIN:  Are you having pain? Yes NPRS scale: 0/10 now; 8/10 standing in the kitchen Pain location: LBP (led to cancer diagnosis)  Aggravating factors: standing in the kitchen Relieving factors: ibuprofen, oxy if it gets bad   PRECAUTIONS: Fall    WEIGHT BEARING RESTRICTIONS: No  FALLS: Has patient fallen in last 6 months? No  LIVING ENVIRONMENT: Lives with: partner Lives in: House/apartment 2 story main living downstairs Stairs: Yes: Internal: 10 steps; on right going up and External: 3 steps; on left going up Has following equipment at home: walking poles   PLOF: partner does cooking, pt does some grilling Enjoys being with friends, hanging out; doesn't go to social events  b/c I can't stand for long time 10 minutes  PATIENT GOALS: I want to walk better, stand longer; going to [employer] in May for 11 days; ride bicycles there;  I'd like to not have to use wheelchair service at the airport    OBJECTIVE:    COGNITION: Overall cognitive status: Within functional limits for tasks assessed    LOWER EXTREMITY ROM:   grossly WFLs  LOWER EXTREMITY MMT:    MMT Right Eval Left Eval  Hip flexion    Hip extension    Hip abduction 4 4  Hip adduction    Hip internal rotation    Hip external rotation    Knee flexion    Knee extension 4 4  Ankle dorsiflexion 4 4  Ankle plantarflexion 4 4  Ankle inversion    Ankle eversion    (Blank rows = not tested)           TRUNK STRENGTH:  Decreased  activation of transverse abdominus muscles; abdominals 4-/5; decreased activation of lumbar multifidi; trunk extensors 4-/5                                  GAIT: Comments: wider base of support; decreased step length; decreased dorsiflexion  FUNCTIONAL TESTS:  5x sit to stand: 20.31 no hands TUG 12.11 sec no hands 2 min walk test 366 feet RPE 7/10 BERG balance test:  43/56 indicating a 50% risk of falls; most challenged by single limb  standing or narrow base of support; decreased speed of movement, limited forward reach   TODAY'S TREATMENT:                                                                                                                              DATE: 7/25    PATIENT EDUCATION: Education details: Educated patient on anatomy and physiology of current symptoms, prognosis, plan of care as well as initial self care strategies to promote recovery Person educated: Patient Education method: Explanation Education comprehension: verbalized understanding  HOME EXERCISE PROGRAM: To be started  GOALS: Goals reviewed with patient? Yes  SHORT TERM GOALS: Target date: 05/27/2023   The patient will demonstrate knowledge of basic self care strategies and exercises to promote strength and mobility  Baseline: Goal status: INITIAL  2.  Able to walk 2 min 375 feet with RPE 5/10 Baseline:  Goal status: INITIAL  3.  The patient will have an improved BERG balance score to   46 /56 indicating reduced risk of falls  Baseline:  Goal status: INITIAL  4.  Improved LE strength and balance  indicated by improved 5x STS to 16 sec Baseline:  Goal status: INITIAL  5.  Set DGI for MiniBESTest goal Baseline:  Goal status: INITIAL    LONG TERM GOALS: Target date: 06/24/2023   The patient will be independent in a safe self progression of a home exercise program/gym program to promote further recovery of function  Baseline:  Goal status: INITIAL  2.  The patient will have  improved gait stamina and speed needed to ambulate 600 feet in 6 minutes with RPE of 6/10  Baseline:  Goal status: INITIAL  3.  The patient will have an improved BERG balance score to  48  /56 indicating reduced risk of falls  Baseline:  Goal status: INITIAL  4.  The patient will have improved Timed Up and Go (TUG) time to    <12   sec indicating improved gait speed and LE strength  Baseline:  Goal status: INITIAL  5.  The patient will have improved hip and knee strength to at least 4+/5 needed for standing >10 min for washing dishes Baseline:  Goal status: INITIAL   ASSESSMENT:  CLINICAL IMPRESSION: Patient is a 72 y.o. male who was seen today for physical therapy evaluation and treatment for balance disorder.  He feels wobbly with the first few steps after sitting and with walking even using walking poles.  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  On a recent cruise he was unable to leave the ship for excursions due to fear of falling and limited walking capacity.  Decreased LE and core strength noted grossly 4/5.  BERG balance test indicates a moderate fall risk with impairments with narrow base of support and single limb balancing.  Decreased gait speed with functional tests.  Patient continues treatment for  multiple myeloma which may affect energy level and increased fatigue.    OBJECTIVE IMPAIRMENTS: cardiopulmonary status limiting activity, decreased activity tolerance, decreased balance, decreased endurance, decreased mobility, difficulty walking, decreased strength, impaired perceived functional ability, and pain.   ACTIVITY LIMITATIONS: carrying, lifting, bending, standing, squatting, and locomotion level  PARTICIPATION LIMITATIONS: meal prep, cleaning, interpersonal relationship, shopping, and community activity  PERSONAL FACTORS: Fitness, Time since onset of injury/illness/exacerbation, and 3+  comorbidities: multiple myeloma, HTN, DDD lumbar spine  are also affecting patient's functional outcome.   REHAB POTENTIAL: Good  CLINICAL DECISION MAKING: Evolving/moderate complexity  EVALUATION COMPLEXITY: Moderate  PLAN:  PT FREQUENCY: 2x/week  PT DURATION: 8 weeks  PLANNED INTERVENTIONS: Therapeutic exercises, Therapeutic activity, Neuromuscular re-education, Balance training, Gait training, Patient/Family education, Self Care, Stair training, Aquatic Therapy, Cryotherapy, Moist heat, Manual therapy, and Re-evaluation  PLAN FOR NEXT SESSION: start basic balance/general strength HEP: counter ex's (heel raises, hip abduction, hip extension, marching) and sit to stands; Do Dynamic Gait Index or MiniBESTest; Nu-Step for conditioning; gait in clinic to build endurance; monitor rating of perceived exertion (RPE) secondary to fatigue related to cancer treatment  Lavinia Sharps, PT 04/29/23 5:33 PM Phone: [phone] Fax: [phone]

## 2023-04-30 ENCOUNTER — Ambulatory Visit: Payer: Medicare Other | Admitting: Family Medicine

## 2023-05-06 NOTE — Therapy (Signed)
OUTPATIENT PHYSICAL THERAPY TREATMENT  Patient Name: Jesse Mcclure. MRN: [id number] DOB:[birth year], 72 y.o., adult Today's Date: 05/07/2023   PCP: Sharlot Gowda MD REFERRING PROVIDER: Artis Delay MD  END OF SESSION:  PT End of Session - 05/07/23 0847     Visit Number 2    Date for PT Re-Evaluation 06/24/23    Authorization Type Medicare    Progress Note Due on Visit 10    PT Start Time 0846    PT Stop Time 0925    PT Time Calculation (min) 39 min    Activity Tolerance Patient tolerated treatment well    Behavior During Therapy [facility] for tasks assessed/performed              Past Medical History:  Diagnosis Date   BPH (benign prostatic hypertrophy)    Degenerative disc disease    Diverticulosis    Hemochromatosis    HTN (hypertension)    Hyperlipidemia    Multiple myeloma (HCC)    Polio    Status post Nissen fundoplication (without gastrostomy tube) procedure    for hiatal hernia.   Past Surgical History:  Procedure Laterality Date   FOOT SURGERY     HIATAL HERNIA REPAIR     OTHER SURGICAL HISTORY     Cataract Surgery--Both eyes   SKIN BIOPSY Right 04/13/2022   squamous cell carcinoma in stiu arising in actinic keratosis   TOTAL KNEE ARTHROPLASTY Left 07/15/2021   Procedure: TOTAL KNEE ARTHROPLASTY;  Surgeon: Teryl Lucy, MD;  Location: [facility];  Service: Orthopedics;  Laterality: Left;   Undescended testes Right 10/05/1966   Patient Active Problem List   Diagnosis Date Noted   Intention tremor 04/23/2023   Balance disorder 04/23/2023   Squamous cell carcinoma in situ 04/14/2023   Melanoma in situ (HCC) 04/14/2023   History of pulmonary embolus (PE) 09/04/2022   History of basal cell cancer 05/01/2022   First degree AV block 04/22/2022   History of skin cancer 04/21/2022   Thrombocytopenia (HCC) 12/23/2021   S/P TKR (total knee replacement), left 07/15/2021   Osteoarthritis of left knee 05/22/2021   Deficiency anemia 01/07/2021   ACE-inhibitor  cough 12/16/2020   Leukopenia due to antineoplastic chemotherapy (HCC) 07/22/2020   Tremor of both hands 10/11/2019   Peripheral neuropathy due to chemotherapy (HCC) 10/11/2019   Dry skin 10/11/2019   Insomnia disorder 09/06/2019   Loose stools 09/06/2019   S/P bone marrow transplant (HCC) 08/30/2019   Pancytopenia, acquired (HCC) 08/30/2019   Hypocalcemia 06/14/2019   Goals of care, counseling/discussion 04/12/2019   Multiple myeloma without remission (HCC) 04/04/2019   Other constipation 04/04/2019   Glucose intolerance (impaired glucose tolerance) 05/20/2016   Vitamin D deficiency 05/20/2016   Chronic musculoskeletal pain 01/09/2016   History of orchiectomy, unilateral 05/20/2015   Obesity (BMI 30-39.9) 05/20/2015   Elevated PSA 05/20/2015   Arthritis 05/20/2015   Essential hypertension 03/02/2012   Hyperlipidemia 03/02/2012   Hemochromatosis, hereditary (HCC) 11/28/2008    ONSET DATE: > 1 year  REFERRING DIAG: G25.2 intention tremor; R26.89 balance disorder  THERAPY DIAG: difficulty in walking; fall risk Muscle weakness (generalized)  Difficulty in walking, not elsewhere classified  Balance disorder  Rationale for Evaluation and Treatment: Rehabilitation  SUBJECTIVE:  SUBJECTIVE STATEMENT: I have some back pain this morning on the right side. It's a stabbing pain. At night it was into my leg.   Eval: Being treated for cancer 4-5 years including oral chemo at home;  Dr wants me to walk and I use poles b/c I'm very wobbly.  Dr. says it's not the medication causing this.  Went on cruise in [place] a few months ago but I couldn't tour and stayed on the boat. No falls but several close calls: 3-4 steps garage steps into the house, stumbled going up.  If I sit a while then get up and take a  few steps, will feel wobbly.  Hold onto something to pick up something from the floor. Denies loss of sensation in feet. I sleep a lot.  I'm very sedentary.   My partner thinks I need to move more.  I ride the stationary bike Tuesday/Thursday occasionally.  I used to have a trainer prior to getting sick. I get wheelchair service at airports. [place] trip in May  Goes by Jesse Mcclure  PERTINENT HISTORY:  Oncology History  Multiple myeloma without remission (HCC)  03/20/2019 Imaging    1. Tumor involving the L5 and S1 and S2 segments of the spine as described with mass effects upon the left L4, L5, S1 and more distal left sacral nerves   Left TKR HTN Back pain  PAIN:  PAIN:  Are you having pain? Yes NPRS scale: 9/10 now; 8/10 standing in the kitchen Pain location: LBP (led to cancer diagnosis)  Aggravating factors: standing in the kitchen Relieving factors: ibuprofen, oxy if it gets bad   PRECAUTIONS: Fall    WEIGHT BEARING RESTRICTIONS: No  FALLS: Has patient fallen in last 6 months? No  LIVING ENVIRONMENT: Lives with: partner Lives in: House/apartment 2 story main living downstairs Stairs: Yes: Internal: 10 steps; on right going up and External: 3 steps; on left going up Has following equipment at home: walking poles   PLOF: partner does cooking, pt does some grilling Enjoys being with friends, hanging out; doesn't go to social events  b/c I can't stand for long time 10 minutes  PATIENT GOALS: I want to walk better, stand longer; going to [employer] in May for 11 days; ride bicycles there;  I'd like to not have to use wheelchair service at the airport    OBJECTIVE:    COGNITION: Overall cognitive status: Within functional limits for tasks assessed    LOWER EXTREMITY ROM:   grossly WFLs  LOWER EXTREMITY MMT:    MMT Right Eval Left Eval  Hip flexion    Hip extension    Hip abduction 4 4  Hip adduction    Hip internal rotation    Hip external rotation    Knee  flexion    Knee extension 4 4  Ankle dorsiflexion 4 4  Ankle plantarflexion 4 4  Ankle inversion    Ankle eversion    (Blank rows = not tested)           TRUNK STRENGTH:  Decreased activation of transverse abdominus muscles; abdominals 4-/5; decreased activation of lumbar multifidi; trunk extensors 4-/5                                  GAIT: Comments: wider base of support; decreased step length; decreased dorsiflexion  FUNCTIONAL TESTS:  5x sit to stand: 20.31 no hands TUG 12.11 sec no  hands 2 min walk test 366 feet RPE 7/10 BERG balance test:  43/56 indicating a 50% risk of falls; most challenged by single limb standing or narrow base of support; decreased speed of movement, limited forward reach   TODAY'S TREATMENT:                                                                                                                              DATE:  05/07/23 NuStep L 5 x 5 min Standing extension 2x 10 with back to counter  Heel raises x 10  Toe raises x 10 HS curls x 10 B Rest break RPE 4 -  moderate Seated LAQ x 10 Mini squat x 10 Hip ABD and Ext x 10 ea B Marching x 10 B Rest break Then repeated heel and toe raises and minisquats.  Back extensions x 10    7/25    PATIENT EDUCATION: Education details: Educated patient on anatomy and physiology of current symptoms, prognosis, plan of care as well as initial self care strategies to promote recovery Person educated: Patient Education method: Explanation Education comprehension: verbalized understanding  HOME EXERCISE PROGRAM: To be started  GOALS: Goals reviewed with patient? Yes  SHORT TERM GOALS: Target date: 05/27/2023   The patient will demonstrate knowledge of basic self care strategies and exercises to promote strength and mobility  Baseline: Goal status: INITIAL  2.  Able to walk 2 min 375 feet with RPE 5/10 Baseline:  Goal status: INITIAL  3.  The patient will have an improved BERG balance score to    46 /56 indicating reduced risk of falls  Baseline:  Goal status: INITIAL  4.  Improved LE strength and balance  indicated by improved 5x STS to 16 sec Baseline:  Goal status: INITIAL  5.  Set DGI for MiniBESTest goal Baseline:  Goal status: INITIAL    LONG TERM GOALS: Target date: 06/24/2023   The patient will be independent in a safe self progression of a home exercise program/gym program to promote further recovery of function  Baseline:  Goal status: INITIAL  2.  The patient will have improved gait stamina and speed needed to ambulate 600 feet in 6 minutes with RPE of 6/10  Baseline:  Goal status: INITIAL  3.  The patient will have an improved BERG balance score to  48  /56 indicating reduced risk of falls  Baseline:  Goal status: INITIAL  4.  The patient will have improved Timed Up and Go (TUG) time to    <12   sec indicating improved gait speed and LE strength  Baseline:  Goal status: INITIAL  5.  The patient will have improved hip and knee strength to at least 4+/5 needed for standing >10 min for washing dishes Baseline:  Goal status: INITIAL   ASSESSMENT:  CLINICAL IMPRESSION: Jesse Mcclure presents with increased back pain today with intermittent shooting down his R leg. Back extensions were helpful in decreasing his  pain. HEP was intiated with exercises completed in the clinic today. Bill required intermittent short rest periods and RPE was moderate. Jesse Mcclure continues to demonstrate potential for improvement and would benefit from continued skilled therapy to address impairments.    OBJECTIVE IMPAIRMENTS: cardiopulmonary status limiting activity, decreased activity tolerance, decreased balance, decreased endurance, decreased mobility, difficulty walking, decreased strength, impaired perceived functional ability, and pain.   ACTIVITY LIMITATIONS: carrying, lifting, bending, standing, squatting, and locomotion level  PARTICIPATION LIMITATIONS: meal prep, cleaning,  interpersonal relationship, shopping, and community activity  PERSONAL FACTORS: Fitness, Time since onset of injury/illness/exacerbation, and 3+ comorbidities: multiple myeloma, HTN, DDD lumbar spine  are also affecting patient's functional outcome.   REHAB POTENTIAL: Good  CLINICAL DECISION MAKING: Evolving/moderate complexity  EVALUATION COMPLEXITY: Moderate  PLAN:  PT FREQUENCY: 2x/week  PT DURATION: 8 weeks  PLANNED INTERVENTIONS: Therapeutic exercises, Therapeutic activity, Neuromuscular re-education, Balance training, Gait training, Patient/Family education, Self Care, Stair training, Aquatic Therapy, Cryotherapy, Moist heat, Manual therapy, and Re-evaluation  PLAN FOR NEXT SESSION: start basic balance/general strength HEP: counter ex's (heel raises, hip abduction, hip extension, marching) and sit to stands; Do Dynamic Gait Index or MiniBESTest; Nu-Step for conditioning; gait in clinic to build endurance; monitor rating of perceived exertion (RPE) secondary to fatigue related to cancer treatment  Solon Palm, PT  05/07/23 9:25 AM Phone: [phone] Fax: [phone]

## 2023-05-07 ENCOUNTER — Encounter: Payer: Self-pay | Admitting: Physical Therapy

## 2023-05-07 ENCOUNTER — Ambulatory Visit: Payer: Medicare Other | Attending: Hematology and Oncology | Admitting: Physical Therapy

## 2023-05-07 DIAGNOSIS — M6281 Muscle weakness (generalized): Secondary | ICD-10-CM | POA: Insufficient documentation

## 2023-05-07 DIAGNOSIS — R262 Difficulty in walking, not elsewhere classified: Secondary | ICD-10-CM | POA: Diagnosis not present

## 2023-05-07 DIAGNOSIS — R2689 Other abnormalities of gait and mobility: Secondary | ICD-10-CM | POA: Diagnosis not present

## 2023-05-10 ENCOUNTER — Telehealth: Payer: Self-pay | Admitting: Hematology and Oncology

## 2023-05-10 ENCOUNTER — Other Ambulatory Visit: Payer: Self-pay | Admitting: Hematology and Oncology

## 2023-05-10 NOTE — Telephone Encounter (Signed)
Patient stated they had a conflict in schedule with appointment and needed to reschedule; patient is aware of rescheduled appointment times/dates

## 2023-05-11 ENCOUNTER — Inpatient Hospital Stay: Payer: Medicare Other | Attending: Hematology and Oncology | Admitting: Internal Medicine

## 2023-05-11 VITALS — BP 146/102 | HR 69 | Temp 98.4°F | Resp 18 | Wt 261.3 lb

## 2023-05-11 DIAGNOSIS — D492 Neoplasm of unspecified behavior of bone, soft tissue, and skin: Secondary | ICD-10-CM | POA: Diagnosis not present

## 2023-05-11 DIAGNOSIS — C9000 Multiple myeloma not having achieved remission: Secondary | ICD-10-CM | POA: Diagnosis not present

## 2023-05-11 DIAGNOSIS — Z7901 Long term (current) use of anticoagulants: Secondary | ICD-10-CM | POA: Diagnosis not present

## 2023-05-11 DIAGNOSIS — I2510 Atherosclerotic heart disease of native coronary artery without angina pectoris: Secondary | ICD-10-CM | POA: Diagnosis not present

## 2023-05-11 DIAGNOSIS — G250 Essential tremor: Secondary | ICD-10-CM | POA: Insufficient documentation

## 2023-05-11 DIAGNOSIS — I10 Essential (primary) hypertension: Secondary | ICD-10-CM | POA: Insufficient documentation

## 2023-05-11 DIAGNOSIS — N400 Enlarged prostate without lower urinary tract symptoms: Secondary | ICD-10-CM | POA: Insufficient documentation

## 2023-05-11 DIAGNOSIS — I700 Atherosclerosis of aorta: Secondary | ICD-10-CM | POA: Insufficient documentation

## 2023-05-11 DIAGNOSIS — E785 Hyperlipidemia, unspecified: Secondary | ICD-10-CM | POA: Diagnosis not present

## 2023-05-11 DIAGNOSIS — T451X5A Adverse effect of antineoplastic and immunosuppressive drugs, initial encounter: Secondary | ICD-10-CM | POA: Insufficient documentation

## 2023-05-11 DIAGNOSIS — Z79624 Long term (current) use of inhibitors of nucleotide synthesis: Secondary | ICD-10-CM | POA: Diagnosis not present

## 2023-05-11 DIAGNOSIS — G620 Drug-induced polyneuropathy: Secondary | ICD-10-CM | POA: Insufficient documentation

## 2023-05-11 DIAGNOSIS — Z9221 Personal history of antineoplastic chemotherapy: Secondary | ICD-10-CM | POA: Diagnosis not present

## 2023-05-11 DIAGNOSIS — Z79899 Other long term (current) drug therapy: Secondary | ICD-10-CM | POA: Insufficient documentation

## 2023-05-11 DIAGNOSIS — Z7961 Long term (current) use of immunomodulator: Secondary | ICD-10-CM | POA: Insufficient documentation

## 2023-05-11 MED ORDER — PRIMIDONE 50 MG PO TABS: 50.0000 mg | ORAL_TABLET | Freq: Every day | ORAL | 2 refills | Status: DC

## 2023-05-11 NOTE — Progress Notes (Signed)
[facility] [address]  [place], [place] [address] [phone]   New Patient Evaluation  Date of Service: 05/11/23 Patient Name: Jesse Mcclure. Patient MRN: [id number] Patient DOB: 03-23-1951 Provider: Henreitta Leber, MD  Identifying Statement:  Jesse Mcclure. is a 72 y.o. adult with Lumbar spine tumor - Plan: MR LUMBAR SPINE W WO CONTRAST who presents for initial consultation and evaluation regarding cancer associated neurologic deficits.    Referring Provider: Artis Delay, MD [address] [place],  [place] [address]  Primary Cancer:  Oncologic History: Oncology History  Multiple myeloma without remission (HCC)  03/20/2019 Imaging   1. Tumor involving the L5 and S1 and S2 segments of the spine as described with mass effects upon the left L4, L5, S1 and more distal left sacral nerves as described above. 2. Does the patient have a history of malignancy? 3. Multiple plasmacytomas and metastatic disease could give this appearance   03/27/2019 Pathology Results   Soft Tissue Needle Core Biopsy, sacral mass - PLASMABLASTIC NEOPLASM. - SEE COMMENT. Microscopic Comment The sections show needle core biopsy fragments of soft tissue densely infiltrated by a relatively monomorphic infiltrate of atypical plasmacytoid cells characterized by vesicular or partially clumped chromatin and prominent nucleoli. This is associated with scattered mitosis. A battery of immunohistochemical stains was performed and show that the atypical plasmacytoid cells are positive for CD138, CD43 and cytoplasmic kappa. There is also weak positivity for CD56 and partial variable positivity for cyclin D1. The atypical plasmacytoid cells are negative for cytoplasmic lambda, CD10, PAX5, CD79a, CD20, CD3, CD5, CD34, EBV (ISH) and mostly negative for LCA. The overall morphologic and histologic features are most compatible with a plasmablastic neoplasm. The  differential diagnosis includes plasmablastic lymphoma and plasmablastic plasmacytoma/myeloma. Based on the overall phenotypic features and the clinical setting, plasmacytoma/myeloma is favored. Clinical correlation and hematologic evaluation is recommended   03/27/2019 Procedure   Technically successful CT-guided biopsy of sacral soft tissue mass.   04/04/2019 Initial Diagnosis   Multiple myeloma without remission (HCC)   04/11/2019 PET scan   IMPRESSION: 1. Previously noted expansile lesions involving the L5 vertebra and sacrum are again noted and exhibit intense FDG uptake compatible with metabolically active disease. A third focus of increased uptake without corresponding CT abnormality is noted within the intertrochanteric portions of the proximal right femur. 2. Small lucent lesion within the T3 vertebra is noted without corresponding FDG uptake. 3. Asymmetric left bladder wall thickening, etiology indeterminate. 4. Aortic Atherosclerosis (ICD10-I70.0). Lad coronary artery calcification.   04/12/2019 Cancer Staging   Staging form: Plasma Cell Myeloma and Plasma Cell Disorders, AJCC 8th Edition - Clinical stage from 04/12/2019: Beta-2-microglobulin (mg/L): 2, Albumin (g/dL): 3.6, ISS: Stage I, High-risk cytogenetics: Unknown, LDH: Unknown - Signed by Artis Delay, MD on 04/12/2019   04/14/2019 Bone Marrow Biopsy   Bone Marrow, Aspirate,Biopsy, and Clot, right iliac bone - PLASMA CELL MYELOMA.   04/17/2019 - 07/11/2019 Chemotherapy   The patient had bortezemib, revlimid and dexamethasone for chemotherapy treatment.     08/17/2019 Bone Marrow Transplant   He received high dose melphalan followed by autologous stem cell transplant at [facility]   11/22/2019 Bone Marrow Biopsy   BONE MARROW biopsy at [facility]:       Mild monoclonal plasmacytosis (approximately 2%), kappa light chain restricted.    11/22/2019 PET scan   PET CT at [facility] 1.  Similar size and appearance of mildly  hypermetabolic  lytic lesion in the L5 vertebral body and posterior elements.  2.  Otherwise, no substantial FDG uptake compared to background bone marrow in the additional osseous lucent lesions.    12/20/2019 - 05/09/2020 Radiation Therapy   Radiation Treatment Dates: 12/20/2019 through 01/08/2020 Site Technique Total Dose (Gy) Dose per Fx (Gy) Completed Fx Beam Energies  Lumbar Spine: Spine 3D 35/35 2.5 14/14 15X        02/29/2020 PET scan   1. Similar size and appearance of expansile lytic lesion in the L5 vertebral body and posterior elements with minimal FDG activity.  2. Otherwise, no additional osseous lucent lesions with FDG uptake above background bone marrow   05/31/2020 -  Chemotherapy   The patient had Revlimid for chemotherapy treatment.     11/25/2021 - 05/11/2022 Chemotherapy   Patient is on Treatment Plan : MYELOMA RELAPSED / REFRACTORY Daratumumab SQ + Bortezomib + Dexamethasone (DaraVd) q21d / Daratumumab SQ q28d      11/25/2021 - 10/29/2022 Chemotherapy   Patient is on Treatment Plan : MYELOMA RELAPSED / REFRACTORY Daratumumab SQ + Bortezomib + Dexamethasone (DaraVd) q21d / Daratumumab SQ q28d        History of Present Illness: The patient's records from the referring physician were obtained and reviewed and the patient interviewed to confirm this HPI.  Jesse Mcclure. Presents to review tremor, balance issues.  He describes 6 months history of tremor with activity.  It affects both hands, causes a "fast shake".  He has difficulty using a fork and other utensils, and is using a bib to eat.  Sometimes objects are difficult to grasp.  Sister has a similar tremor, parents died relatively young and did not have tremor.  No involvement of feet, head or voice.  It might improve with social drinking.  He also describes impairment in balance over the same time period.  He was limited in walking on his most recent vacation, needed to stay on the cruise rather than go on outings.  No  notable falls, still does walk independently around the home.  Recently started PT sessions.  Medications: Current Outpatient Medications on File Prior to Visit  Medication Sig Dispense Refill   acetaminophen (TYLENOL) 650 MG CR tablet Take 650 mg by mouth every 8 (eight) hours as needed for pain.     acyclovir (ZOVIRAX) 400 MG tablet Take 1 tablet (400 mg total) by mouth daily. 90 tablet 1   atorvastatin (LIPITOR) 20 MG tablet Take 1 tablet (20 mg total) by mouth daily. TAKE 1 TABLET(20 MG) BY MOUTH DAILY 90 tablet 0   calcium carbonate (TUMS - DOSED IN MG ELEMENTAL CALCIUM) 500 MG chewable tablet Chew 1 tablet by mouth 2 (two) times daily.     Cholecalciferol (VITAMIN D) 50 MCG (2000 UT) tablet Take 2,000 Units by mouth daily.     dexamethasone (DECADRON) 4 MG tablet 2 tabs weekly 12 tablet 6   erythromycin ophthalmic ointment Place 1 Application into both eyes 3 (three) times daily. 3.5 g 0   folic acid (FOLVITE) 400 MCG tablet Take 400 mcg by mouth daily.     ixazomib citrate (NINLARO) 3 MG capsule Take 1 capsule (3 mg) by mouth weekly, 3 weeks on, 1 week off, repeat every 4 weeks. Take on an empty stomach 1hr before or 2hr after meals. 3 capsule 11   losartan-hydrochlorothiazide (HYZAAR) 100-12.5 MG tablet TAKE 1 TABLET BY MOUTH DAILY 90 tablet 1   Multiple Vitamin (MULTI-VITAMIN) tablet Take 1 tablet by  mouth daily.     ondansetron (ZOFRAN) 8 MG tablet Take 1 tablet (8 mg total) by mouth 2 (two) times daily as needed (Nausea or vomiting). 30 tablet 1   oxyCODONE (ROXICODONE) 5 MG immediate release tablet Take 1 tablet (5 mg total) by mouth every 6 (six) hours as needed for severe pain. 90 tablet 0   Polyethyl Glycol-Propyl Glycol (SYSTANE OP) Place 1 drop into both eyes daily as needed (dry eyes).     pomalidomide (POMALYST) 3 MG capsule Take 1 capsule (3mg ) by mouth daily for 21 days, rest 7 days, repeat every 28 days. 21 capsule 0   prochlorperazine (COMPAZINE) 10 MG tablet TAKE 1  TABLET(10 MG) BY MOUTH EVERY 6 HOURS AS NEEDED FOR NAUSEA OR VOMITING 60 tablet 1   rivaroxaban (XARELTO) 10 MG TABS tablet Take 1 tablet (10 mg total) by mouth daily with supper. 90 tablet 1   vitamin B-12 (CYANOCOBALAMIN) 1000 MCG tablet Take 1,000 mcg by mouth daily.     No current facility-administered medications on file prior to visit.    Allergies: No Known Allergies Past Medical History:  Past Medical History:  Diagnosis Date   BPH (benign prostatic hypertrophy)    Degenerative disc disease    Diverticulosis    Hemochromatosis    HTN (hypertension)    Hyperlipidemia    Multiple myeloma (HCC)    Polio    Status post Nissen fundoplication (without gastrostomy tube) procedure    for hiatal hernia.   Past Surgical History:  Past Surgical History:  Procedure Laterality Date   FOOT SURGERY     HIATAL HERNIA REPAIR     OTHER SURGICAL HISTORY     Cataract Surgery--Both eyes   SKIN BIOPSY Right 04/13/2022   squamous cell carcinoma in stiu arising in actinic keratosis   TOTAL KNEE ARTHROPLASTY Left 07/15/2021   Procedure: TOTAL KNEE ARTHROPLASTY;  Surgeon: Teryl Lucy, MD;  Location: [facility];  Service: Orthopedics;  Laterality: Left;   Undescended testes Right 10/05/1966   Social History:  Social History   Socioeconomic History   Marital status: Single    Spouse name: Not on file   Number of children: Not on file   Years of education: Not on file   Highest education level: Not on file  Occupational History   Not on file  Tobacco Use   Smoking status: Never   Smokeless tobacco: Never  Vaping Use   Vaping status: Never Used  Substance and Sexual Activity   Alcohol use: Not Currently    Alcohol/week: 4.0 standard drinks of alcohol    Types: 4 drink(s) per week    Comment: occasionally   Drug use: No   Sexual activity: Yes  Other Topics Concern   Not on file  Social History Narrative   Not on file   Social Determinants of Health   Financial Resource  Strain: Low Risk  (12/29/2022)   Overall Financial Resource Strain (CARDIA)    Difficulty of Paying Living Expenses: Not hard at all  Food Insecurity: No Food Insecurity (12/29/2022)   Hunger Vital Sign    Worried About Running Out of Food in the Last Year: Never true    Ran Out of Food in the Last Year: Never true  Transportation Needs: No Transportation Needs (12/29/2022)   PRAPARE - Administrator, Civil Service (Medical): No    Lack of Transportation (Non-Medical): No  Physical Activity: Insufficiently Active (12/29/2022)   Exercise Vital Sign  Days of Exercise per Week: 2 days    Minutes of Exercise per Session: 20 min  Stress: No Stress Concern Present (12/29/2022)   [employer] of Occupational Health - Occupational Stress Questionnaire    Feeling of Stress : Not at all  Social Connections: Not on file  Intimate Partner Violence: Not on file   Family History:  Family History  Problem Relation Age of Onset   Breast cancer Mother    Breast cancer Sister     Review of Systems: Constitutional: Doesn't report fevers, chills or abnormal weight loss Eyes: Doesn't report blurriness of vision Ears, nose, mouth, throat, and face: Doesn't report sore throat Respiratory: Doesn't report cough, dyspnea or wheezes Cardiovascular: Doesn't report palpitation, chest discomfort  Gastrointestinal:  Doesn't report nausea, constipation, diarrhea GU: Doesn't report incontinence Skin: Doesn't report skin rashes Neurological: Per HPI Musculoskeletal: Doesn't report joint pain Behavioral/Psych: Doesn't report anxiety  Physical Exam: Vitals:   05/11/23 1400  BP: (!) 146/102  Pulse: 69  Resp: 18  Temp: 98.4 F (36.9 C)  SpO2: 97%   KPS: 80. General: Alert, cooperative, pleasant, in no acute distress Head: Normal EENT: No conjunctival injection or scleral icterus.  Lungs: Resp effort normal Cardiac: Regular rate Abdomen: Non-distended abdomen Skin: No rashes cyanosis  or petechiae. Extremities: No clubbing or edema  Neurologic Exam: Mental Status: Awake, alert, attentive to examiner. Oriented to self and environment. Language is fluent with intact comprehension.  Cranial Nerves: Visual acuity is grossly normal. Visual fields are full. Extra-ocular movements intact. No ptosis. Face is symmetric Motor: Tone and bulk are normal. Symmetric, high frequency action tremor in hands only. Reflexes are symmetric, no pathologic reflexes present.  Sensory: Stocking impairment Gait: Sensory dystaxia   Labs: I have reviewed the data as listed    Component Value Date/Time   NA 142 04/23/2023 0934   NA 139 10/11/2018 1115   NA 139 04/05/2014 0849   K 3.6 04/23/2023 0934   K 4.4 04/05/2014 0849   CL 107 04/23/2023 0934   CO2 30 04/23/2023 0934   CO2 23 04/05/2014 0849   GLUCOSE 126 (H) 04/23/2023 0934   GLUCOSE 109 04/05/2014 0849   BUN 17 04/23/2023 0934   BUN 23 10/11/2018 1115   BUN 16.8 04/05/2014 0849   CREATININE 0.79 04/23/2023 0934   CREATININE 0.80 10/29/2022 1046   CREATININE 0.91 07/29/2017 1004   CREATININE 0.9 04/05/2014 0849   CALCIUM 8.7 (L) 04/23/2023 0934   CALCIUM 8.7 04/05/2014 0849   PROT 6.1 (L) 04/23/2023 0934   PROT 7.2 10/11/2018 1115   PROT 6.7 04/05/2014 0849   ALBUMIN 3.6 04/23/2023 0934   ALBUMIN 4.7 10/11/2018 1115   ALBUMIN 3.8 04/05/2014 0849   AST 23 04/23/2023 0934   AST 24 10/29/2022 1046   AST 26 04/05/2014 0849   ALT 29 04/23/2023 0934   ALT 31 10/29/2022 1046   ALT 30 04/05/2014 0849   ALKPHOS 51 04/23/2023 0934   ALKPHOS 75 04/05/2014 0849   BILITOT 0.8 04/23/2023 0934   BILITOT 0.9 10/29/2022 1046   BILITOT 0.71 04/05/2014 0849   GFRNONAA >60 04/23/2023 0934   GFRNONAA >60 10/29/2022 1046   GFRAA >60 06/14/2020 0830   Lab Results  Component Value Date   WBC 3.3 (L) 04/23/2023   NEUTROABS 1.0 (L) 04/23/2023   HGB 13.7 04/23/2023   HCT 40.1 04/23/2023   MCV 102.0 (H) 04/23/2023   PLT 153 04/23/2023      Assessment/Plan Lumbar spine tumor -  Plan: MR LUMBAR SPINE W WO CONTRAST  Essential tremor  Peripheral neuropathy due to chemotherapy (HCC)  Jesse Mcclure. Presents with clinical syndrome which may localize to peripheral nerves and/or lumbosacral spine. 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  In addition, he has some peripheral neuropathy on exam, likely from velcade exposure.    We recommended re-imaging lumbar spine with MRI, he is agreeable with this.  Tremor type is likely essential, especially given family history.  We will avoid propanolol for now given known first degree AV block.  Recommended trial of primidone 50mg  HS, he knows this can cause some drowsiness.  We spent twenty additional minutes teaching regarding the natural history, biology, and historical experience in the treatment of neurologic complications of cancer.   We appreciate the opportunity to participate in the care of Jesse Mcclure.Marland Kitchen  He should return to clinic next month following MRI study.  All questions were answered. The patient knows to call the clinic with any problems, questions or concerns. No barriers to learning were detected.  The total time spent in the encounter was 40 minutes and more than 50% was on counseling and review of test results   Henreitta Leber, MD Medical Director of Neuro-Oncology [facility] at [facility] 05/11/23 2:52 PM

## 2023-05-12 ENCOUNTER — Encounter: Payer: Self-pay | Admitting: Family Medicine

## 2023-05-12 ENCOUNTER — Ambulatory Visit (INDEPENDENT_AMBULATORY_CARE_PROVIDER_SITE_OTHER): Payer: Medicare Other | Admitting: Family Medicine

## 2023-05-12 VITALS — BP 130/80 | HR 83 | Ht 69.0 in | Wt 260.4 lb

## 2023-05-12 DIAGNOSIS — Z131 Encounter for screening for diabetes mellitus: Secondary | ICD-10-CM

## 2023-05-12 DIAGNOSIS — D039 Melanoma in situ, unspecified: Secondary | ICD-10-CM

## 2023-05-12 DIAGNOSIS — G250 Essential tremor: Secondary | ICD-10-CM

## 2023-05-12 DIAGNOSIS — R7302 Impaired glucose tolerance (oral): Secondary | ICD-10-CM | POA: Diagnosis not present

## 2023-05-12 DIAGNOSIS — M1990 Unspecified osteoarthritis, unspecified site: Secondary | ICD-10-CM | POA: Diagnosis not present

## 2023-05-12 DIAGNOSIS — E669 Obesity, unspecified: Secondary | ICD-10-CM | POA: Diagnosis not present

## 2023-05-12 DIAGNOSIS — Z96652 Presence of left artificial knee joint: Secondary | ICD-10-CM

## 2023-05-12 DIAGNOSIS — E785 Hyperlipidemia, unspecified: Secondary | ICD-10-CM | POA: Diagnosis not present

## 2023-05-12 DIAGNOSIS — I10 Essential (primary) hypertension: Secondary | ICD-10-CM

## 2023-05-12 DIAGNOSIS — C9000 Multiple myeloma not having achieved remission: Secondary | ICD-10-CM

## 2023-05-12 DIAGNOSIS — G620 Drug-induced polyneuropathy: Secondary | ICD-10-CM | POA: Diagnosis not present

## 2023-05-12 DIAGNOSIS — Z86711 Personal history of pulmonary embolism: Secondary | ICD-10-CM | POA: Diagnosis not present

## 2023-05-12 DIAGNOSIS — T451X5A Adverse effect of antineoplastic and immunosuppressive drugs, initial encounter: Secondary | ICD-10-CM

## 2023-05-12 LAB — POCT GLYCOSYLATED HEMOGLOBIN (HGB A1C): Hemoglobin A1C: 6 % — AB (ref 4.0–5.6)

## 2023-05-12 NOTE — Progress Notes (Signed)
   Subjective:    Patient ID: Jesse [place]., Jesse Mcclure    DOB: 03/30/1951, 72 y.o.   MRN: [id number]  HPI He is here for interval evaluation.  He recently had a wellness visit documented.  Continues have difficulty with low back pain and was recently seen for this.  An MRI has been ordered.  He was also given Mysoline to help with his tremor and he is very happy with the results from that.  He is also being sent to physical therapy for a general back rehab program.  He was also recently diagnosed with a melanoma near the left ear and is scheduled for follow-up surgery for this.  He is being followed for his underlying multiple myeloma regularly.  Recent blood work did show elevated blood sugar.  He continues on Xarelto 10mg ..  He also continues on Lipitor and valsartan.  He has had a left TKR and is having no difficulty with that.   Review of Systems  All other systems reviewed and are negative.      Objective:    Physical Exam Alert and in no distress. Tympanic membranes and canals are normal. Pharyngeal area is normal. Neck is supple without adenopathy or thyromegaly. Cardiac exam shows a regular sinus rhythm without murmurs or gallops. Lungs are clear to auscultation. Hemoglobin A1c is 6.0       Assessment & Plan:   Problem List Items Addressed This Visit     Arthritis   Essential hypertension   Essential tremor   Glucose intolerance (impaired glucose tolerance)   History of pulmonary embolus (PE)   Hyperlipidemia   Relevant Orders   Lipid panel   Melanoma in situ (HCC)   Multiple myeloma without remission (HCC)   Obesity (BMI 30-39.9)   Peripheral neuropathy due to chemotherapy (HCC)   S/P TKR (total knee replacement), left   Other Visit Diagnoses     Screening for diabetes mellitus    -  Primary   Relevant Orders   POCT glycosylated hemoglobin (Hb A1C)     I discussed the diagnosis of prediabetes with him in regard to diet and exercise specifically cutting back on  carbohydrates.  Hopefully physical therapy will help with general health and wellbeing.  Also recommend that he get Tdap, RSV and eventually COVID and flu shots.

## 2023-05-13 ENCOUNTER — Ambulatory Visit: Payer: Medicare Other

## 2023-05-14 ENCOUNTER — Telehealth: Payer: Self-pay | Admitting: Internal Medicine

## 2023-05-14 NOTE — Telephone Encounter (Signed)
Scheduled per 08/06 los, patient has been called and notified.

## 2023-05-16 ENCOUNTER — Other Ambulatory Visit: Payer: Self-pay

## 2023-05-16 ENCOUNTER — Emergency Department (HOSPITAL_BASED_OUTPATIENT_CLINIC_OR_DEPARTMENT_OTHER): Payer: Medicare Other | Admitting: Radiology

## 2023-05-16 ENCOUNTER — Emergency Department (HOSPITAL_BASED_OUTPATIENT_CLINIC_OR_DEPARTMENT_OTHER): Payer: Medicare Other

## 2023-05-16 ENCOUNTER — Encounter (HOSPITAL_BASED_OUTPATIENT_CLINIC_OR_DEPARTMENT_OTHER): Payer: Self-pay

## 2023-05-16 ENCOUNTER — Inpatient Hospital Stay (HOSPITAL_BASED_OUTPATIENT_CLINIC_OR_DEPARTMENT_OTHER)
Admission: EM | Admit: 2023-05-16 | Discharge: 2023-05-23 | DRG: 291 | Disposition: A | Discharge status: Home / Self Care | Payer: Medicare Other | Attending: Internal Medicine | Admitting: Internal Medicine

## 2023-05-16 DIAGNOSIS — I517 Cardiomegaly: Secondary | ICD-10-CM | POA: Diagnosis not present

## 2023-05-16 DIAGNOSIS — M25461 Effusion, right knee: Secondary | ICD-10-CM | POA: Diagnosis not present

## 2023-05-16 DIAGNOSIS — J90 Pleural effusion, not elsewhere classified: Secondary | ICD-10-CM | POA: Diagnosis not present

## 2023-05-16 DIAGNOSIS — E1165 Type 2 diabetes mellitus with hyperglycemia: Secondary | ICD-10-CM | POA: Diagnosis not present

## 2023-05-16 DIAGNOSIS — D61818 Other pancytopenia: Secondary | ICD-10-CM | POA: Diagnosis present

## 2023-05-16 DIAGNOSIS — R0902 Hypoxemia: Secondary | ICD-10-CM | POA: Diagnosis not present

## 2023-05-16 DIAGNOSIS — Z8249 Family history of ischemic heart disease and other diseases of the circulatory system: Secondary | ICD-10-CM | POA: Diagnosis not present

## 2023-05-16 DIAGNOSIS — R0602 Shortness of breath: Secondary | ICD-10-CM | POA: Diagnosis not present

## 2023-05-16 DIAGNOSIS — J9811 Atelectasis: Secondary | ICD-10-CM | POA: Diagnosis not present

## 2023-05-16 DIAGNOSIS — Z86718 Personal history of other venous thrombosis and embolism: Secondary | ICD-10-CM | POA: Diagnosis not present

## 2023-05-16 DIAGNOSIS — Z86711 Personal history of pulmonary embolism: Secondary | ICD-10-CM | POA: Diagnosis present

## 2023-05-16 DIAGNOSIS — I5030 Unspecified diastolic (congestive) heart failure: Secondary | ICD-10-CM | POA: Diagnosis present

## 2023-05-16 DIAGNOSIS — N400 Enlarged prostate without lower urinary tract symptoms: Secondary | ICD-10-CM | POA: Diagnosis present

## 2023-05-16 DIAGNOSIS — Z6838 Body mass index (BMI) 38.0-38.9, adult: Secondary | ICD-10-CM

## 2023-05-16 DIAGNOSIS — Z7952 Long term (current) use of systemic steroids: Secondary | ICD-10-CM

## 2023-05-16 DIAGNOSIS — I509 Heart failure, unspecified: Secondary | ICD-10-CM | POA: Diagnosis not present

## 2023-05-16 DIAGNOSIS — M109 Gout, unspecified: Secondary | ICD-10-CM | POA: Diagnosis present

## 2023-05-16 DIAGNOSIS — E785 Hyperlipidemia, unspecified: Secondary | ICD-10-CM | POA: Diagnosis present

## 2023-05-16 DIAGNOSIS — Z8612 Personal history of poliomyelitis: Secondary | ICD-10-CM

## 2023-05-16 DIAGNOSIS — E876 Hypokalemia: Secondary | ICD-10-CM | POA: Diagnosis not present

## 2023-05-16 DIAGNOSIS — I110 Hypertensive heart disease with heart failure: Principal | ICD-10-CM | POA: Diagnosis present

## 2023-05-16 DIAGNOSIS — E669 Obesity, unspecified: Secondary | ICD-10-CM | POA: Diagnosis present

## 2023-05-16 DIAGNOSIS — R609 Edema, unspecified: Secondary | ICD-10-CM | POA: Diagnosis not present

## 2023-05-16 DIAGNOSIS — Z79899 Other long term (current) drug therapy: Secondary | ICD-10-CM

## 2023-05-16 DIAGNOSIS — Z803 Family history of malignant neoplasm of breast: Secondary | ICD-10-CM

## 2023-05-16 DIAGNOSIS — Z7901 Long term (current) use of anticoagulants: Secondary | ICD-10-CM

## 2023-05-16 DIAGNOSIS — I440 Atrioventricular block, first degree: Secondary | ICD-10-CM | POA: Diagnosis present

## 2023-05-16 DIAGNOSIS — M7918 Myalgia, other site: Secondary | ICD-10-CM | POA: Diagnosis present

## 2023-05-16 DIAGNOSIS — Z6835 Body mass index (BMI) 35.0-35.9, adult: Secondary | ICD-10-CM | POA: Diagnosis not present

## 2023-05-16 DIAGNOSIS — M25561 Pain in right knee: Secondary | ICD-10-CM | POA: Diagnosis not present

## 2023-05-16 DIAGNOSIS — G250 Essential tremor: Secondary | ICD-10-CM | POA: Diagnosis present

## 2023-05-16 DIAGNOSIS — G8929 Other chronic pain: Secondary | ICD-10-CM | POA: Diagnosis present

## 2023-05-16 DIAGNOSIS — I10 Essential (primary) hypertension: Secondary | ICD-10-CM | POA: Diagnosis present

## 2023-05-16 DIAGNOSIS — I5031 Acute diastolic (congestive) heart failure: Secondary | ICD-10-CM | POA: Diagnosis not present

## 2023-05-16 DIAGNOSIS — C9000 Multiple myeloma not having achieved remission: Secondary | ICD-10-CM

## 2023-05-16 DIAGNOSIS — I5021 Acute systolic (congestive) heart failure: Secondary | ICD-10-CM | POA: Diagnosis not present

## 2023-05-16 DIAGNOSIS — Z96652 Presence of left artificial knee joint: Secondary | ICD-10-CM | POA: Diagnosis present

## 2023-05-16 DIAGNOSIS — I3139 Other pericardial effusion (noninflammatory): Secondary | ICD-10-CM | POA: Diagnosis not present

## 2023-05-16 DIAGNOSIS — M85861 Other specified disorders of bone density and structure, right lower leg: Secondary | ICD-10-CM | POA: Diagnosis not present

## 2023-05-16 DIAGNOSIS — Z1152 Encounter for screening for COVID-19: Secondary | ICD-10-CM | POA: Diagnosis not present

## 2023-05-16 DIAGNOSIS — I700 Atherosclerosis of aorta: Secondary | ICD-10-CM | POA: Diagnosis not present

## 2023-05-16 DIAGNOSIS — M1711 Unilateral primary osteoarthritis, right knee: Secondary | ICD-10-CM | POA: Diagnosis not present

## 2023-05-16 LAB — CBC WITH DIFFERENTIAL/PLATELET
Abs Immature Granulocytes: 0.01 10*3/uL (ref 0.00–0.07)
Basophils Absolute: 0 10*3/uL (ref 0.0–0.1)
Basophils Relative: 1 %
Eosinophils Absolute: 0.2 10*3/uL (ref 0.0–0.5)
Eosinophils Relative: 4 %
HCT: 38.7 % — ABNORMAL LOW (ref 39.0–52.0)
Hemoglobin: 12.9 g/dL — ABNORMAL LOW (ref 13.0–17.0)
Immature Granulocytes: 0 %
Lymphocytes Relative: 33 %
Lymphs Abs: 1.4 10*3/uL (ref 0.7–4.0)
MCH: 34.2 pg — ABNORMAL HIGH (ref 26.0–34.0)
MCHC: 33.3 g/dL (ref 30.0–36.0)
MCV: 102.7 fL — ABNORMAL HIGH (ref 80.0–100.0)
Monocytes Absolute: 1.2 10*3/uL — ABNORMAL HIGH (ref 0.1–1.0)
Monocytes Relative: 28 %
Neutro Abs: 1.4 10*3/uL — ABNORMAL LOW (ref 1.7–7.7)
Neutrophils Relative %: 34 %
Platelets: 137 10*3/uL — ABNORMAL LOW (ref 150–400)
RBC: 3.77 MIL/uL — ABNORMAL LOW (ref 4.22–5.81)
RDW: 14.3 % (ref 11.5–15.5)
WBC: 4.2 10*3/uL (ref 4.0–10.5)
nRBC: 0 % (ref 0.0–0.2)

## 2023-05-16 LAB — BASIC METABOLIC PANEL
Anion gap: 7 (ref 5–15)
BUN: 22 mg/dL (ref 8–23)
CO2: 28 mmol/L (ref 22–32)
Calcium: 8.9 mg/dL (ref 8.9–10.3)
Chloride: 101 mmol/L (ref 98–111)
Creatinine, Ser: 0.73 mg/dL (ref 0.61–1.24)
GFR, Estimated: >60 mL/min (ref >60)
Glucose, Bld: 82 mg/dL (ref 70–99)
Potassium: 4.1 mmol/L (ref 3.5–5.1)
Sodium: 136 mmol/L (ref 135–145)

## 2023-05-16 LAB — TROPONIN I (HIGH SENSITIVITY)
Troponin I (High Sensitivity): 17 ng/L (ref <18)
Troponin I (High Sensitivity): 20 ng/L — ABNORMAL HIGH (ref <18)

## 2023-05-16 LAB — RESP PANEL BY RT-PCR (RSV, FLU A&B, COVID)  RVPGX2
Influenza A by PCR: NEGATIVE
Influenza B by PCR: NEGATIVE
Resp Syncytial Virus by PCR: NEGATIVE
SARS Coronavirus 2 by RT PCR: NEGATIVE

## 2023-05-16 LAB — BRAIN NATRIURETIC PEPTIDE: B Natriuretic Peptide: 353.2 pg/mL — ABNORMAL HIGH (ref 0.0–100.0)

## 2023-05-16 MED ORDER — ACETAMINOPHEN 325 MG PO TABS: 650.0000 mg | ORAL_TABLET | Freq: Three times a day (TID) | ORAL | Status: DC | PRN

## 2023-05-16 MED ORDER — ENOXAPARIN SODIUM 60 MG/0.6ML IJ SOSY: 60.0000 mg | PREFILLED_SYRINGE | INTRAMUSCULAR | Status: DC

## 2023-05-16 MED ORDER — ACYCLOVIR 400 MG PO TABS
400.0000 mg | ORAL_TABLET | Freq: Every day | ORAL | Status: DC
  Administered 2023-05-17 – 2023-05-23 (×7): 400 mg via ORAL
  Filled 2023-05-16 (×7): qty 1

## 2023-05-16 MED ORDER — FOLIC ACID 1 MG PO TABS: 0.5000 mg | ORAL_TABLET | Freq: Every day | ORAL | Status: DC

## 2023-05-16 MED ORDER — ALBUTEROL SULFATE HFA 108 (90 BASE) MCG/ACT IN AERS: 2.0000 | INHALATION_SPRAY | RESPIRATORY_TRACT | Status: DC | PRN

## 2023-05-16 MED ORDER — ATORVASTATIN CALCIUM 20 MG PO TABS
20.0000 mg | ORAL_TABLET | Freq: Every day | ORAL | Status: DC
  Administered 2023-05-17 – 2023-05-23 (×7): 20 mg via ORAL
  Filled 2023-05-16 (×7): qty 1

## 2023-05-16 MED ORDER — ALBUTEROL SULFATE (2.5 MG/3ML) 0.083% IN NEBU: 2.5000 mg | INHALATION_SOLUTION | Freq: Four times a day (QID) | RESPIRATORY_TRACT | Status: DC | PRN

## 2023-05-16 MED ORDER — IOHEXOL 350 MG/ML SOLN
80.0000 mL | Freq: Once | INTRAVENOUS | Status: AC | PRN
  Administered 2023-05-16: 80 mL via INTRAVENOUS

## 2023-05-16 MED ORDER — LOSARTAN POTASSIUM 50 MG PO TABS
100.0000 mg | ORAL_TABLET | Freq: Every day | ORAL | Status: DC
  Administered 2023-05-17: 100 mg via ORAL
  Filled 2023-05-16: qty 2

## 2023-05-16 MED ORDER — OXYCODONE HCL 5 MG PO TABS
5.0000 mg | ORAL_TABLET | ORAL | Status: DC | PRN
  Administered 2023-05-16 – 2023-05-19 (×6): 5 mg via ORAL
  Filled 2023-05-16 (×7): qty 1

## 2023-05-16 MED ORDER — IPRATROPIUM-ALBUTEROL 0.5-2.5 (3) MG/3ML IN SOLN
RESPIRATORY_TRACT | Status: AC
  Filled 2023-05-16: qty 3

## 2023-05-16 MED ORDER — KETOROLAC TROMETHAMINE 15 MG/ML IJ SOLN
15.0000 mg | Freq: Three times a day (TID) | INTRAMUSCULAR | Status: DC | PRN
  Administered 2023-05-16: 15 mg via INTRAVENOUS
  Filled 2023-05-16: qty 1

## 2023-05-16 MED ORDER — FOLIC ACID 1 MG PO TABS
0.5000 mg | ORAL_TABLET | Freq: Every day | ORAL | Status: DC
  Administered 2023-05-17 – 2023-05-23 (×7): 0.5 mg via ORAL
  Filled 2023-05-16 (×7): qty 1

## 2023-05-16 MED ORDER — RIVAROXABAN 10 MG PO TABS
10.0000 mg | ORAL_TABLET | Freq: Every day | ORAL | Status: DC
  Administered 2023-05-17 – 2023-05-22 (×6): 10 mg via ORAL
  Filled 2023-05-16 (×6): qty 1

## 2023-05-16 MED ORDER — POMALIDOMIDE 3 MG PO CAPS: 3.0000 mg | ORAL_CAPSULE | Freq: Every day | ORAL | Status: DC

## 2023-05-16 MED ORDER — FUROSEMIDE 10 MG/ML IJ SOLN
40.0000 mg | Freq: Once | INTRAMUSCULAR | Status: AC
  Administered 2023-05-16: 40 mg via INTRAVENOUS
  Filled 2023-05-16: qty 4

## 2023-05-16 MED ORDER — SODIUM CHLORIDE 0.9% FLUSH
3.0000 mL | Freq: Two times a day (BID) | INTRAVENOUS | Status: DC
  Administered 2023-05-16 – 2023-05-23 (×14): 3 mL via INTRAVENOUS

## 2023-05-16 MED ORDER — ORAL CARE MOUTH RINSE: 15.0000 mL | OROMUCOSAL | Status: DC | PRN

## 2023-05-16 NOTE — ED Notes (Signed)
Ginger Ale and graham crackers have been given.

## 2023-05-16 NOTE — ED Notes (Addendum)
[facility] Called for transport  

## 2023-05-16 NOTE — H&P (Signed)
History and Physical    Jesse Mcclure. XBM:[id number] DOB: 11-27-1950 DOA: 05/16/2023  PCP: Ronnald Nian, MD  Patient coming from: Home   Chief Complaint: SOB  HPI: Jesse Mcclure. is a 72 y.o. adult with medical history significant of HTN, BPH, HLD, multiple myeloma, polio as a child who presents for shortness of breath.  He notes that he was recently seen for a tremor in his hands.  At that time, he was started on Primidone.  About 2 days after starting this he developed SOB and exercise intolerance, which is not like him.  He associated the symptoms with the primidone and stopped it.  He has had swelling in his legs as well which has been coming on for a few weeks now.  He denies chest pain, change in urination, dysuria, nausea, vomiting, fever, chills, sick contacts.  He notes that he was so short of breath that he could not make it to the bathroom without getting winded.  In the ED, he was also noted to become hypoxic when he was ambulating to the restroom.  Shortness of breath is worsened with lying flat.  He has not awoken with symptoms.  He was short of breath when sitting up to the side of the bed on my examination.  He has a TTE from 2022 which showed early diastolic dysfunction (LVH) but no EF changes.  Further symptoms include non productive cough and wheezing.   ED Course: In the ED, he had normal renal function.  BNP was 353.2 (previous in 2022 was 270), TnI were 17 and 20. H/H were 12 and 39.  He has an elevated MCV at baseline and is on folate.  He has platelets of 137.  Flu and covid were negative. CXR showed mild pulmonary edema.  CTA of the chest showed small bilateral pleural effusions, small pericardial effusion, multiple osseus sclerotic lesions in the T spine which were noted to be new.  In the ED, he dropped to the high 80s on pulse ox when ambulating.  EKG showed low voltage without TWI or ST changes.  He did receive a dose of lasix and noted good UOP with this.    Review of Systems: As per HPI otherwise all other systems reviewed and are negative.   Past Medical History:  Diagnosis Date   BPH (benign prostatic hypertrophy)    Degenerative disc disease    Diverticulosis    Hemochromatosis    HTN (hypertension)    Hyperlipidemia    Multiple myeloma (HCC)    Polio    Status post Nissen fundoplication (without gastrostomy tube) procedure    for hiatal hernia.    Past Surgical History:  Procedure Laterality Date   FOOT SURGERY     HIATAL HERNIA REPAIR     OTHER SURGICAL HISTORY     Cataract Surgery--Both eyes   SKIN BIOPSY Right 04/13/2022   squamous cell carcinoma in stiu arising in actinic keratosis   TOTAL KNEE ARTHROPLASTY Left 07/15/2021   Procedure: TOTAL KNEE ARTHROPLASTY;  Surgeon: Teryl Lucy, MD;  Location: [facility];  Service: Orthopedics;  Laterality: Left;   Undescended testes Right 10/05/1966    Social History  reports that he has never smoked. He has never used smokeless tobacco. He reports that he does not currently use alcohol after a past usage of about 4.0 standard drinks of alcohol per week. He reports that he does not use drugs.  No Known Allergies  Family History  Problem Relation  Age of Onset   Breast cancer Mother    Arrhythmia Father    Heart failure Father    Breast cancer Sister      Prior to Admission medications   Medication Sig Start Date End Date Taking? Authorizing Provider  acetaminophen (TYLENOL) 650 MG CR tablet Take 650 mg by mouth every 8 (eight) hours as needed for pain.    [provider]  acyclovir (ZOVIRAX) 400 MG tablet Take 1 tablet (400 mg total) by mouth daily. 11/10/22   Artis Delay, MD  atorvastatin (LIPITOR) 20 MG tablet Take 1 tablet (20 mg total) by mouth daily. TAKE 1 TABLET(20 MG) BY MOUTH DAILY 04/27/23   Tysinger, Kermit Balo, PA-C  calcium carbonate (TUMS - DOSED IN MG ELEMENTAL CALCIUM) 500 MG chewable tablet Chew 1 tablet by mouth 2 (two) times daily.    [provider]  Cholecalciferol (VITAMIN D) 50 MCG (2000 UT) tablet Take 2,000 Units by mouth daily.    [provider]       Artis Delay, MD  erythromycin ophthalmic ointment Place 1 Application into both eyes 3 (three) times daily. Patient not taking: Reported on 05/12/2023 03/09/23   Ronnald Nian, MD  folic acid (FOLVITE) 400 MCG tablet Take 400 mcg by mouth daily.    [provider]  ixazomib citrate (NINLARO) 3 MG capsule Take 1 capsule (3 mg) by mouth weekly, 3 weeks on, 1 week off, repeat every 4 weeks. Take on an empty stomach 1hr before or 2hr after meals. 01/05/23   Artis Delay, MD  losartan-hydrochlorothiazide (HYZAAR) 100-12.5 MG tablet TAKE 1 TABLET BY MOUTH DAILY 06/30/22   Ronnald Nian, MD  Multiple Vitamin (MULTI-VITAMIN) tablet Take 1 tablet by mouth daily. 08/30/19   [provider]  Polyethyl Glycol-Propyl Glycol (SYSTANE OP) Place 1 drop into both eyes daily as needed (dry eyes).    [provider]  pomalidomide (POMALYST) 3 MG capsule Take 1 capsule (3mg ) by mouth daily for 21 days, rest 7 days, repeat every 28 days. 04/23/23   Artis Delay, MD       Henreitta Leber, MD  prochlorperazine (COMPAZINE) 10 MG tablet TAKE 1 TABLET(10 MG) BY MOUTH EVERY 6 HOURS AS NEEDED FOR NAUSEA OR VOMITING Patient not taking: Reported on 05/12/2023 01/22/23   Artis Delay, MD  rivaroxaban (XARELTO) 10 MG TABS tablet Take 1 tablet (10 mg total) by mouth daily with supper. 04/23/23   Artis Delay, MD  vitamin B-12 (CYANOCOBALAMIN) 1000 MCG tablet Take 1,000 mcg by mouth daily.    [provider]    Physical Exam: Vitals:   05/16/23 1600 05/16/23 1700 05/16/23 1829 05/16/23 1916  BP: (!) 148/82 129/79 (!) 139/113 (!) 164/84  Pulse: (!) 56 (!) 52 (!) 58 63  Resp: 16 16 20 20   Temp:  98.1 F (36.7 C) 98.2 F (36.8 C) 98.6 F (37 C)  TempSrc:  Oral Oral Oral  SpO2: 96% 95% 95% 96%  Weight:    120 kg  Height:    5\' 9"  (1.753 m)    Constitutional:  NAD, calm, comfortable, SOB when sitting up in bed Eyes: lids and conjunctivae normal ENMT: Mucous membranes are moist.  Neck: normal, supple, he has a very large neck circumference, could not assess JVD.  Respiratory: Decreased breath sounds at bases, slightly worse on the right, no crackles or rales, no wheezing Cardiovascular: Bradycardic, no murmur noted, distant heart sounds, 2+ pitting edema to the mid shins bilaterally.  He has palpable pulses in the DP arteries bilaterally.  He has no wounds apparent on feet.  Abdomen: Obese, distended, +BS, soft, NT Musculoskeletal: no clubbing / cyanosis. Normal tone and bulk for age, he is not tender along the spine.  Skin: no rashes, lesions, ulcers on exposed skin.  He has some SK's on the back.   Neurologic: Able to sit up and move in bed, speech is clear, grossly intact.  Psychiatric: Normal judgment and insight. Alert and oriented x 3. Normal mood.   Labs on Admission: I have personally reviewed following labs and imaging studies  CBC: Recent Labs  Lab 05/16/23 1406  WBC 4.2  NEUTROABS 1.4*  HGB 12.9*  HCT 38.7*  MCV 102.7*  PLT 137*    Basic Metabolic Panel: Recent Labs  Lab 05/16/23 1406  NA 136  K 4.1  CL 101  CO2 28  GLUCOSE 82  BUN 22  CREATININE 0.73  CALCIUM 8.9    GFR: Estimated Creatinine Clearance (by C-G formula based on SCr of 0.73 mg/dL) Male: 40.9 mL/min Male: 108.3 mL/min  Liver Function Tests: No results for input(s): "AST", "ALT", "ALKPHOS", "BILITOT", "PROT", "ALBUMIN" in the last 168 hours.  Urine analysis:    Component Value Date/Time   BILIRUBINUR n 05/20/2016 1101   PROTEINUR n 05/20/2016 1101   UROBILINOGEN negative 05/20/2016 1101   NITRITE n 05/20/2016 1101   LEUKOCYTESUR Negative 05/20/2016 1101    Radiological Exams on Admission: CT Angio Chest PE W and/or Wo Contrast  Result Date: 05/16/2023 CLINICAL DATA:  Concern for pulmonary embolism. EXAM: CT ANGIOGRAPHY CHEST WITH  CONTRAST TECHNIQUE: Multidetector CT imaging of the chest was performed using the standard protocol during bolus administration of intravenous contrast. Multiplanar CT image reconstructions and MIPs were obtained to evaluate the vascular anatomy. RADIATION DOSE REDUCTION: This exam was performed according to the departmental dose-optimization program which includes automated exposure control, adjustment of the mA and/or kV according to patient size and/or use of iterative reconstruction technique. CONTRAST:  80mL OMNIPAQUE IOHEXOL 350 MG/ML SOLN COMPARISON:  Chest CT dated 09/16/2021. FINDINGS: Cardiovascular: There is no cardiomegaly. Small pericardial effusion measuring 11 mm in thickness. Coronary vascular calcifications noted. Mild atherosclerotic calcification of the thoracic aorta. No aneurysm or dilatation. No pulmonary artery embolus identified. Mediastinum/Nodes: No hilar or mediastinal adenopathy. Small hiatal hernia. The esophagus is grossly unremarkable. No mediastinal fluid collection. Lungs/Pleura: Small bilateral pleural effusions with partial compressive atelectasis of the lower lobes. There is no pneumothorax. There is diffuse interstitial and interlobular septal prominence consistent with edema. Upper Abdomen: No acute abnormality. Musculoskeletal: There is osteopenia with degenerative changes of the spine. Multiple osseous sclerotic lesions most consistent with metastatic disease. Correlation with history of known malignancy recommended. Several old left rib fractures noted. No acute osseous pathology. Review of the MIP images confirms the above findings. IMPRESSION: 1. No CT evidence of pulmonary embolism. 2. Small bilateral pleural effusions with partial compressive atelectasis of the lower lobes. 3. Small pericardial effusion. 4. Multiple osseous sclerotic lesions most consistent with metastatic disease. Correlation with history of known malignancy recommended. 5.  Aortic Atherosclerosis  (ICD10-I70.0). Electronically Signed   By: Elgie Collard M.D.   On: 05/16/2023 16:15   DG Chest 2 View  Result Date: 05/16/2023 CLINICAL DATA:  72 year old male with history of shortness of breath. EXAM: CHEST - 2 VIEW COMPARISON:  Chest x-ray 04/09/2022. FINDINGS: Compared to the prior study, the heart now appears enlarged. There is cephalization of the pulmonary vasculature and slight  indistinctness of the interstitial markings suggestive of mild pulmonary edema. Trace bilateral pleural effusions. No confluent consolidative airspace disease. No pneumothorax. Upper mediastinal contours are within normal limits. IMPRESSION: 1. The appearance of the chest is concerning for potential congestive heart failure, as above. Electronically Signed   By: Trudie Reed M.D.   On: 05/16/2023 13:46    EKG: Independently reviewed. Low voltage, no apparent ST changes or TWI.   Assessment/Plan  Shortness of breath LE swelling Elevated BNP - DDX includes medication effect from his MM medications vs. New heart failure vs. Hypoalbuminemia (LFTs pending) vs. Other - TTE ordered to evaluate for CHF - Lasix X 1 tonight - reassess in the AM - Telemetry - Monitor I/Os, daily weights - Pulse oximetry continuous - Oxygen as needed - Albuterol nebulizer PRN for wheezing - Multiple myeloma meds will be held for now, until Oncology can comment on this.  - Ninlaro, 20% peripheral edema - Pomolidomide, 25% peripheral edema - Primidone does not seem to have a SE of SOB, will plan to continue to hold this medication for now.  - PT/OT  Multiple myeloma without remission (HCC) Thrombocytopenia Macrocytosis, mild anemia - Hold MM meds for now, see above - Oncology consulted, Dr. Myna Hidalgo, call team in the AM to see patient.  He is a patient of Dr. Bertis Ruddy - New metastatic disease noted on imaging - Continue folic acid  Essential hypertension - Continue losartan - Hold hydrochlorothiazide while starting lasix     Hyperlipidemia - Continue atorvastatin    Chronic musculoskeletal pain - Toradol PRN - Oxycodone PRN - Tylenol PRN    History of pulmonary embolus (PE) - Recent decrease of Xarelto to prophylaxis dose, continue    Essential tremor - Monitor  DVT prophylaxis: Xarelto  Code Status:   Full, confirmed with patient and partner Ron at bedside  Family Communication:  Partner Ron at bedside Disposition Plan:   Patient is from:  Home  Anticipated DC to:  Home  Anticipated DC date:  05/17/23  Anticipated DC barriers: Ability to ambulate without oxygen  Consults called:  Oncology by EDP, call in the AM  Admission status:  Obs, Telemetry   Severity of Illness: The appropriate patient status for this patient is OBSERVATION. Observation status is judged to be reasonable and necessary in order to provide the required intensity of service to ensure the patient's safety. The patient's presenting symptoms, physical exam findings, and initial radiographic and laboratory data in the context of their medical condition is felt to place them at decreased risk for further clinical deterioration. Furthermore, it is anticipated that the patient will be medically stable for discharge from the hospital within 2 midnights of admission.     Debe Coder MD [facility]  How to contact the [facility] Attending or Consulting provider 7A - 7P or covering provider during after hours 7P -7A, for this patient?   Check the care team in [facility] and look for a) attending/consulting [facility] provider listed and b) the [facility] team listed Log into [email or web address] and use [place]'s universal password to access. If you do not have the password, please contact the hospital operator. Locate the [facility] provider you are looking for under [facility] and page to a number that you can be directly reached. If you still have difficulty reaching the provider, please page the [facility] (Director on Call) for the Hospitalists listed on amion  for assistance.  05/16/2023, 8:14 PM

## 2023-05-16 NOTE — Progress Notes (Signed)
Patient arrived on unit around 1900 from [employer]. Vitals obtained. Given urinal and large amount of urine on spontaneous voiding. No signs of acute distress noted at this time. Patient states all belongings accounted for.

## 2023-05-16 NOTE — ED Triage Notes (Signed)
Pt c/o SHOB onset Thurs-Fri. Denies associate CP, no prevalent hx. BLE swelling, pt advises "has gotten a little worse over the last couple weeks."  Recently started primidone Thursday, has since stopped r/t [facility].

## 2023-05-16 NOTE — ED Provider Notes (Signed)
Jesse Mcclure EMERGENCY DEPARTMENT AT [facility] Provider Note   CSN: [id number] Arrival date & time: 05/16/23  1257     History Chief Complaint  Patient presents with   Shortness of Breath    Jesse Sicher. is a 72 y.o. adult. Patient with past history significant for multiple myeloma, essential hypertension, hyperlipidemia, first-degree AV block presents emergency department complaints of shortness of breath.  Reports this has been worsening over the last few days with onset beginning around Thursday or Friday.  Reports that shortness of breath is typically worsened with laying flat.  Endorses some mild swelling to the lower extremities.  No history of CHF.  Not currently on any diuretic.  Reports that he recently started primidone for tremors with Dr. Barbaraann Cao and feels that his symptoms began after starting the medication.  States he last took a dose of the medication on Friday but has not had improvement in symptoms.   Shortness of Breath      Home Medications Prior to Admission medications   Medication Sig Start Date End Date Taking? Authorizing Provider  acetaminophen (TYLENOL) 650 MG CR tablet Take 650 mg by mouth every 8 (eight) hours as needed for pain.    [provider]  acyclovir (ZOVIRAX) 400 MG tablet Take 1 tablet (400 mg total) by mouth daily. 11/10/22   Artis Delay, MD  atorvastatin (LIPITOR) 20 MG tablet Take 1 tablet (20 mg total) by mouth daily. TAKE 1 TABLET(20 MG) BY MOUTH DAILY 04/27/23   Tysinger, Kermit Balo, PA-C  calcium carbonate (TUMS - DOSED IN MG ELEMENTAL CALCIUM) 500 MG chewable tablet Chew 1 tablet by mouth 2 (two) times daily.    [provider]  Cholecalciferol (VITAMIN D) 50 MCG (2000 UT) tablet Take 2,000 Units by mouth daily.    [provider]  dexamethasone (DECADRON) 4 MG tablet 2 tabs weekly 03/12/23   Artis Delay, MD  erythromycin ophthalmic ointment Place 1 Application into both eyes 3 (three) times  daily. Patient not taking: Reported on 05/12/2023 03/09/23   Ronnald Nian, MD  folic acid (FOLVITE) 400 MCG tablet Take 400 mcg by mouth daily.    [provider]  ixazomib citrate (NINLARO) 3 MG capsule Take 1 capsule (3 mg) by mouth weekly, 3 weeks on, 1 week off, repeat every 4 weeks. Take on an empty stomach 1hr before or 2hr after meals. 01/05/23   Artis Delay, MD  losartan-hydrochlorothiazide (HYZAAR) 100-12.5 MG tablet TAKE 1 TABLET BY MOUTH DAILY 06/30/22   Ronnald Nian, MD  Multiple Vitamin (MULTI-VITAMIN) tablet Take 1 tablet by mouth daily. 08/30/19   [provider]  Polyethyl Glycol-Propyl Glycol (SYSTANE OP) Place 1 drop into both eyes daily as needed (dry eyes).    [provider]  pomalidomide (POMALYST) 3 MG capsule Take 1 capsule (3mg ) by mouth daily for 21 days, rest 7 days, repeat every 28 days. 04/23/23   Artis Delay, MD  primidone (MYSOLINE) 50 MG tablet Take 1 tablet (50 mg total) by mouth at bedtime. 05/11/23   Vaslow, Georgeanna Lea, MD  prochlorperazine (COMPAZINE) 10 MG tablet TAKE 1 TABLET(10 MG) BY MOUTH EVERY 6 HOURS AS NEEDED FOR NAUSEA OR VOMITING Patient not taking: Reported on 05/12/2023 01/22/23   Artis Delay, MD  rivaroxaban (XARELTO) 10 MG TABS tablet Take 1 tablet (10 mg total) by mouth daily with supper. 04/23/23   Artis Delay, MD  vitamin B-12 (CYANOCOBALAMIN) 1000 MCG tablet Take 1,000 mcg by mouth daily.  [provider]      Allergies    Patient has no known allergies.    Review of Systems   Review of Systems  Respiratory:  Positive for shortness of breath.   All other systems reviewed and are negative.   Physical Exam Updated Vital Signs BP 129/79   Pulse (!) 52   Temp 98.1 F (36.7 C) (Oral)   Resp 16   SpO2 95%  Physical Exam Vitals and nursing note reviewed.  Constitutional:      General: He is not in acute distress.    Appearance: He is well-developed.  HENT:     Head: Normocephalic and atraumatic.  Eyes:      Conjunctiva/sclera: Conjunctivae normal.  Cardiovascular:     Rate and Rhythm: Normal rate and regular rhythm.     Heart sounds: No murmur heard. Pulmonary:     Effort: Pulmonary effort is normal. No respiratory distress.     Breath sounds: Normal breath sounds.  Abdominal:     Palpations: Abdomen is soft.     Tenderness: There is no abdominal tenderness.  Musculoskeletal:        General: No swelling.     Cervical back: Neck supple.     Right lower leg: 2+ Pitting Edema present.     Left lower leg: 1+ Edema present.  Skin:    General: Skin is warm and dry.     Capillary Refill: Capillary refill takes less than 2 seconds.  Neurological:     Mental Status: He is alert.  Psychiatric:        Mood and Affect: Mood normal.     ED Results / Procedures / Treatments   Labs (all labs ordered are listed, but only abnormal results are displayed) Labs Reviewed  CBC WITH DIFFERENTIAL/PLATELET - Abnormal; Notable for the following components:      Result Value   RBC 3.77 (*)    Hemoglobin 12.9 (*)    HCT 38.7 (*)    MCV 102.7 (*)    MCH 34.2 (*)    Platelets 137 (*)    Neutro Abs 1.4 (*)    Monocytes Absolute 1.2 (*)    All other components within normal limits  BRAIN NATRIURETIC PEPTIDE - Abnormal; Notable for the following components:   B Natriuretic Peptide 353.2 (*)    All other components within normal limits  TROPONIN I (HIGH SENSITIVITY) - Abnormal; Notable for the following components:   Troponin I (High Sensitivity) 20 (*)    All other components within normal limits  RESP PANEL BY RT-PCR (RSV, FLU A&B, COVID)  [id number]  BASIC METABOLIC PANEL  TROPONIN I (HIGH SENSITIVITY)    EKG EKG Interpretation Date/Time:  Sunday May 16 2023 13:06:32 EDT Ventricular Rate:  56 PR Interval:    QRS Duration:  82 QT Interval:  408 QTC Calculation: 393 R Axis:   46  Text Interpretation: Undetermined rhythm  vs aflutter Low voltage QRS Cannot rule out Anterior infarct , age  undetermined Abnormal ECG When compared with ECG of 16-Sep-2021 03:04, PREVIOUS ECG IS PRESENT Confirmed by Derwood Kaplan ([id number]) on 05/16/2023 3:44:15 PM  Radiology CT Angio Chest PE W and/or Wo Contrast  Result Date: 05/16/2023 CLINICAL DATA:  Concern for pulmonary embolism. EXAM: CT ANGIOGRAPHY CHEST WITH CONTRAST TECHNIQUE: Multidetector CT imaging of the chest was performed using the standard protocol during bolus administration of intravenous contrast. Multiplanar CT image reconstructions and MIPs were obtained to evaluate the vascular anatomy. RADIATION DOSE REDUCTION:  This exam was performed according to the departmental dose-optimization program which includes automated exposure control, adjustment of the mA and/or kV according to patient size and/or use of iterative reconstruction technique. CONTRAST:  80mL OMNIPAQUE IOHEXOL 350 MG/ML SOLN COMPARISON:  Chest CT dated 09/16/2021. FINDINGS: Cardiovascular: There is no cardiomegaly. Small pericardial effusion measuring 11 mm in thickness. Coronary vascular calcifications noted. Mild atherosclerotic calcification of the thoracic aorta. No aneurysm or dilatation. No pulmonary artery embolus identified. Mediastinum/Nodes: No hilar or mediastinal adenopathy. Small hiatal hernia. The esophagus is grossly unremarkable. No mediastinal fluid collection. Lungs/Pleura: Small bilateral pleural effusions with partial compressive atelectasis of the lower lobes. There is no pneumothorax. There is diffuse interstitial and interlobular septal prominence consistent with edema. Upper Abdomen: No acute abnormality. Musculoskeletal: There is osteopenia with degenerative changes of the spine. Multiple osseous sclerotic lesions most consistent with metastatic disease. Correlation with history of known malignancy recommended. Several old left rib fractures noted. No acute osseous pathology. Review of the MIP images confirms the above findings. IMPRESSION: 1. No CT evidence of  pulmonary embolism. 2. Small bilateral pleural effusions with partial compressive atelectasis of the lower lobes. 3. Small pericardial effusion. 4. Multiple osseous sclerotic lesions most consistent with metastatic disease. Correlation with history of known malignancy recommended. 5.  Aortic Atherosclerosis (ICD10-I70.0). Electronically Signed   By: Elgie Collard M.D.   On: 05/16/2023 16:15   DG Chest 2 View  Result Date: 05/16/2023 CLINICAL DATA:  72 year old male with history of shortness of breath. EXAM: CHEST - 2 VIEW COMPARISON:  Chest x-ray 04/09/2022. FINDINGS: Compared to the prior study, the heart now appears enlarged. There is cephalization of the pulmonary vasculature and slight indistinctness of the interstitial markings suggestive of mild pulmonary edema. Trace bilateral pleural effusions. No confluent consolidative airspace disease. No pneumothorax. Upper mediastinal contours are within normal limits. IMPRESSION: 1. The appearance of the chest is concerning for potential congestive heart failure, as above. Electronically Signed   By: Trudie Reed M.D.   On: 05/16/2023 13:46    Procedures Procedures   Medications Ordered in ED Medications  albuterol (VENTOLIN HFA) 108 (90 Base) MCG/ACT inhaler 2 puff (has no administration in time range)  furosemide (LASIX) injection 40 mg (has no administration in time range)  iohexol (OMNIPAQUE) 350 MG/ML injection 80 mL (80 mLs Intravenous Contrast Given 05/16/23 1523)    ED Course/ Medical Decision Making/ A&P                               Medical Decision Making Amount and/or Complexity of Data Reviewed Labs: ordered. Radiology: ordered.  Risk Prescription drug management. Decision regarding hospitalization.   This patient presents to the ED for concern of shortness of breath.  Differential diagnosis includes pneumonia, viral URI, COVID-19, PE, CHF   Lab Tests:  I Ordered, and personally interpreted labs.  The pertinent  results include: CBC with mild anemia which is slightly worsened than baseline, BNP elevated at 353, BMP unremarkable, troponin initially at 17 with delta troponin on 20, respiratory viral panel pending   Imaging Studies ordered:  I ordered imaging studies including chest x-ray, CT angio chest I independently visualized and interpreted imaging which showed enlarged heart consistent with CHF, no PE, concerns for multiple lytic lesions consistent with metastatic disease. Some edema in lungs, small bilateral pleural effusion, small pericardial effusion I agree with the radiologist interpretation   Medicines ordered and prescription drug management:  I ordered  medication including Lasix  for edema  Reevaluation of the patient after these medicines showed that the patient stayed the same I have reviewed the patients home medicines and have made adjustments as needed   Problem List / ED Course:  Patient presented to the ED for SOB. History of multiple myeloma, PE, bone marrow transplant. Reports that this has been worsening over the last 3-4 days or so and typically worse with exertion. Prior history of PE and had been on anti-coagulation for an extended period of time, but recently discontinued by oncology. Sees Dr. Bertis Ruddy for oncology. Last saw Dr. Bertis Ruddy on 04/23/2023. Also sees Dr. Barbaraann Cao for tremors with last visit on 08/06/20224 in which patient was started on Primidone. Patient feels that symptoms started after initiating Primidone. Labs largely unremarkable with no acute concerns on BMP, and mild anemia on CBC but appears to be close to patient's baseline. BNP is elevAted at 353.2 concerning for acute CHF, but likely process from progressing myeloma. Troponin at 17 with delta at 20. Likely due to fluid overload. One dose of Lasix 40mg  ordered here in the ED. Chest xray shows concerns for CHF. CT angio of chest negative for PE, but concern for multiple lytic lesions consistent with metastatic  spread. Suspect that patient's symptoms are likely due to progressing multiple myeloma. Will require admission. Consult to oncology for concern of metastatic disease spread. Will need admission for exertional hypoxia, fluid overload. Spoke with Dr. Myna Hidalgo, oncology, who advises admission for further evaluation of concern for progressing myeloma. Admit to medicine to [facility]. Spoke with Dr. Vinie Sill, hospitalist, who will be admitting patient for CHF, exertional hypoxia, and concern for metastatic spread of multiple myeloma for oncology evaluation of patient. Informed patient of this plan and he is in agreement. Will have patient transported via [facility] to [facility].  Final Clinical Impression(s) / ED Diagnoses Final diagnoses:  Hypoxia  Acute congestive heart failure, unspecified heart failure type (HCC)  Multiple myeloma not having achieved remission [facility])    Rx / DC Orders ED Discharge Orders     None         Smitty Knudsen, PA-C 05/16/23 1830    Derwood Kaplan, MD 05/17/23 0720

## 2023-05-16 NOTE — Care Plan (Signed)
Called by PA at [facility]:   History: Shortness of breath, BNP elevated, pitting edema in the legs.  History of PE and MM.  CT scan showed no PE, possible metastatic spread in thoracic spine.  Dr. Myna Hidalgo with Oncology called and recommended admission to [facility] for further oncology evaluation.  Small bilateral pleural effusions and small pericardial effusions also seen on imaging.  Hypoxic with ambulation. 40 of lasix pending.    TTE from 2022 showed normal EF and possible early diastolic dysfunction.  EKG today showed low voltage, no obvious TWI or ST elevations BNP 353.2 TnI 17 --> 20  CTA of the chest. IMPRESSION: 1. No CT evidence of pulmonary embolism. 2. Small bilateral pleural effusions with partial compressive atelectasis of the lower lobes. 3. Small pericardial effusion. 4. Multiple osseous sclerotic lesions most consistent with metastatic disease. Correlation with history of known malignancy recommended. 5.  Aortic Atherosclerosis (ICD10-I70.0).     Electronically Signed   By: Elgie Collard M.D.   On: 05/16/2023 16:15  Plan Lasix as noted Admit to [facility] for Oncology evaluation Trend Troponin Medical Telemetry Repeat TTE   Debe Coder, MD

## 2023-05-16 NOTE — ED Notes (Signed)
Pt walked to the bathroom and back and was short of breath when he returned to his bed. After resting a bit he started to feel some better. Pulse Ox reading was 89% RA after checking when he returned form the restroom.

## 2023-05-17 ENCOUNTER — Other Ambulatory Visit: Payer: Self-pay

## 2023-05-17 ENCOUNTER — Observation Stay (HOSPITAL_COMMUNITY): Payer: Medicare Other

## 2023-05-17 DIAGNOSIS — Z1152 Encounter for screening for COVID-19: Secondary | ICD-10-CM | POA: Diagnosis not present

## 2023-05-17 DIAGNOSIS — I110 Hypertensive heart disease with heart failure: Secondary | ICD-10-CM | POA: Diagnosis present

## 2023-05-17 DIAGNOSIS — R0902 Hypoxemia: Secondary | ICD-10-CM | POA: Diagnosis present

## 2023-05-17 DIAGNOSIS — R609 Edema, unspecified: Secondary | ICD-10-CM | POA: Diagnosis not present

## 2023-05-17 DIAGNOSIS — M1711 Unilateral primary osteoarthritis, right knee: Secondary | ICD-10-CM | POA: Diagnosis not present

## 2023-05-17 DIAGNOSIS — I5030 Unspecified diastolic (congestive) heart failure: Secondary | ICD-10-CM | POA: Diagnosis not present

## 2023-05-17 DIAGNOSIS — I5031 Acute diastolic (congestive) heart failure: Secondary | ICD-10-CM

## 2023-05-17 DIAGNOSIS — Z7901 Long term (current) use of anticoagulants: Secondary | ICD-10-CM | POA: Diagnosis not present

## 2023-05-17 DIAGNOSIS — Z8249 Family history of ischemic heart disease and other diseases of the circulatory system: Secondary | ICD-10-CM | POA: Diagnosis not present

## 2023-05-17 DIAGNOSIS — I3139 Other pericardial effusion (noninflammatory): Secondary | ICD-10-CM | POA: Diagnosis present

## 2023-05-17 DIAGNOSIS — Z86718 Personal history of other venous thrombosis and embolism: Secondary | ICD-10-CM | POA: Diagnosis not present

## 2023-05-17 DIAGNOSIS — R0602 Shortness of breath: Secondary | ICD-10-CM | POA: Diagnosis present

## 2023-05-17 DIAGNOSIS — M25561 Pain in right knee: Secondary | ICD-10-CM | POA: Diagnosis not present

## 2023-05-17 DIAGNOSIS — M109 Gout, unspecified: Secondary | ICD-10-CM | POA: Diagnosis present

## 2023-05-17 DIAGNOSIS — Z86711 Personal history of pulmonary embolism: Secondary | ICD-10-CM | POA: Diagnosis not present

## 2023-05-17 DIAGNOSIS — I440 Atrioventricular block, first degree: Secondary | ICD-10-CM | POA: Diagnosis present

## 2023-05-17 DIAGNOSIS — G250 Essential tremor: Secondary | ICD-10-CM | POA: Diagnosis present

## 2023-05-17 DIAGNOSIS — M85861 Other specified disorders of bone density and structure, right lower leg: Secondary | ICD-10-CM | POA: Diagnosis not present

## 2023-05-17 DIAGNOSIS — Z96652 Presence of left artificial knee joint: Secondary | ICD-10-CM | POA: Diagnosis present

## 2023-05-17 DIAGNOSIS — N400 Enlarged prostate without lower urinary tract symptoms: Secondary | ICD-10-CM | POA: Diagnosis present

## 2023-05-17 DIAGNOSIS — D61818 Other pancytopenia: Secondary | ICD-10-CM | POA: Diagnosis present

## 2023-05-17 DIAGNOSIS — E669 Obesity, unspecified: Secondary | ICD-10-CM | POA: Diagnosis present

## 2023-05-17 DIAGNOSIS — E876 Hypokalemia: Secondary | ICD-10-CM | POA: Diagnosis not present

## 2023-05-17 DIAGNOSIS — M25461 Effusion, right knee: Secondary | ICD-10-CM | POA: Diagnosis not present

## 2023-05-17 DIAGNOSIS — E1165 Type 2 diabetes mellitus with hyperglycemia: Secondary | ICD-10-CM | POA: Diagnosis not present

## 2023-05-17 DIAGNOSIS — I509 Heart failure, unspecified: Secondary | ICD-10-CM | POA: Diagnosis not present

## 2023-05-17 DIAGNOSIS — I5021 Acute systolic (congestive) heart failure: Secondary | ICD-10-CM | POA: Diagnosis not present

## 2023-05-17 DIAGNOSIS — G8929 Other chronic pain: Secondary | ICD-10-CM | POA: Diagnosis present

## 2023-05-17 DIAGNOSIS — Z6838 Body mass index (BMI) 38.0-38.9, adult: Secondary | ICD-10-CM | POA: Diagnosis not present

## 2023-05-17 DIAGNOSIS — Z79899 Other long term (current) drug therapy: Secondary | ICD-10-CM | POA: Diagnosis not present

## 2023-05-17 DIAGNOSIS — Z6835 Body mass index (BMI) 35.0-35.9, adult: Secondary | ICD-10-CM | POA: Diagnosis not present

## 2023-05-17 DIAGNOSIS — C9000 Multiple myeloma not having achieved remission: Secondary | ICD-10-CM | POA: Diagnosis present

## 2023-05-17 DIAGNOSIS — E785 Hyperlipidemia, unspecified: Secondary | ICD-10-CM | POA: Diagnosis present

## 2023-05-17 MED ORDER — PERFLUTREN LIPID MICROSPHERE
1.0000 mL | INTRAVENOUS | Status: DC | PRN
  Administered 2023-05-17: 3 mL via INTRAVENOUS

## 2023-05-17 MED ORDER — FUROSEMIDE 10 MG/ML IJ SOLN
40.0000 mg | Freq: Two times a day (BID) | INTRAMUSCULAR | Status: DC
  Administered 2023-05-18 (×2): 40 mg via INTRAVENOUS
  Filled 2023-05-17 (×2): qty 4

## 2023-05-17 MED ORDER — FUROSEMIDE 10 MG/ML IJ SOLN
60.0000 mg | Freq: Once | INTRAMUSCULAR | Status: AC
  Administered 2023-05-17: 60 mg via INTRAVENOUS
  Filled 2023-05-17: qty 6

## 2023-05-17 MED ORDER — FUROSEMIDE 10 MG/ML IJ SOLN: 40.0000 mg | Freq: Two times a day (BID) | INTRAMUSCULAR | Status: DC

## 2023-05-17 NOTE — Plan of Care (Signed)
  Problem: Education: Goal: Knowledge of General Education information will improve Description: Including pain rating scale, medication(s)/side effects and non-pharmacologic comfort measures Outcome: Progressing   Problem: Health Behavior/Discharge Planning: Goal: Ability to manage health-related needs will improve Outcome: Progressing   Problem: Clinical Measurements: Goal: Ability to maintain clinical measurements within normal limits will improve Outcome: Progressing Goal: Will remain free from infection Outcome: Progressing Goal: Respiratory complications will improve Outcome: Progressing Goal: Cardiovascular complication will be avoided Outcome: Progressing   Problem: Nutrition: Goal: Adequate nutrition will be maintained Outcome: Progressing   Problem: Coping: Goal: Level of anxiety will decrease Outcome: Progressing   Problem: Elimination: Goal: Will not experience complications related to bowel motility Outcome: Progressing Goal: Will not experience complications related to urinary retention Outcome: Progressing   Problem: Pain Managment: Goal: General experience of comfort will improve Outcome: Progressing   Problem: Safety: Goal: Ability to remain free from injury will improve Outcome: Progressing   Problem: Skin Integrity: Goal: Risk for impaired skin integrity will decrease Outcome: Progressing   Problem: Education: Goal: Ability to demonstrate management of disease process will improve Outcome: Progressing Goal: Ability to verbalize understanding of medication therapies will improve Outcome: Progressing Goal: Individualized Educational Video(s) Outcome: Progressing   Problem: Activity: Goal: Capacity to carry out activities will improve Outcome: Progressing   Problem: Cardiac: Goal: Ability to achieve and maintain adequate cardiopulmonary perfusion will improve Outcome: Progressing

## 2023-05-17 NOTE — Progress Notes (Signed)
[facility] Progress Note Patient: Jesse Mcclure. ELF:[id number] DOB: [birth year] DOA: 05/16/2023  DOS: the patient was seen and examined on 05/17/2023  Brief hospital course: PMH of HTN, BPH, HLD, multiple myeloma present to the hospital with complaints of progressively worsening swelling of his legs and shortness of breath for last 2 weeks. Appears to have acute diastolic CHF.  Assessment and Plan: Acute diastolic CHF. Presents with symptoms of significant volume overload, leg swelling, shortness of breath on rest and exertion. Echocardiogram shows preserved EF.  6065%.  Normal RV.  No significant valvular abnormality.  No pericardial effusion. Will treat with IV diuresis and monitor response. Concern is whether his chemotherapy medications are associated with volume overload and therefore currently holding Decadron and Pomalyst.  Multiple myeloma. Chronic thrombocytopenia. Chronic anemia. Medication currently on hold. Appreciate oncology assistance. Will monitor recommendation as CT scan shows evidence of new metastatic lesions.  HTN Blood pressure stable. Will hold losartan and HCTZ while doing IV diuresis.  HLD. On statin continue.  History of PE. On Xarelto for prophylaxis. Monitor.  Essential tremor. On primidone.  Patient has stopped taking this medication as he is concerned that this is related to his shortness of breath. Will recommend to reattempt in the future.  Obesity Body mass index is 38.97 kg/m.  Placing the pt at higher risk of poor outcomes.    Subjective: Continues to have shortness of breath.  Oxygen saturation drops to 90% on room air at the time of my evaluation.  Still significant swelling in his legs.  Physical Exam: General: in Mild distress, No Rash Cardiovascular: S1 and S2 Present, No Murmur Respiratory: Good respiratory effort, Bilateral Air entry present.  Bilateral basal crackles, No wheezes Abdomen: Bowel Sound present, No  tenderness Extremities: Bilateral edema Neuro: Alert and oriented x3, no new focal deficit  Data Reviewed: I have Reviewed nursing notes, Vitals, and Lab results. Since last encounter, pertinent lab results CBC and BMP   . I have ordered test including CBC and BMP and magnesium  . I have discussed pt's care plan and test results with oncology  .   Disposition: Status is: Inpatient Remains inpatient appropriate because: Continue diuresis  rivaroxaban (XARELTO) tablet 10 mg Start: 05/17/23 1700 rivaroxaban (XARELTO) tablet 10 mg   Family Communication: Family at bedside Level of care: Telemetry   Vitals:   05/16/23 1916 05/16/23 2356 05/17/23 0429 05/17/23 1337  BP: (!) 164/84 136/64 131/86 (!) 148/74  Pulse: 63 60 60 62  Resp: 20 15 13 18   Temp: 98.6 F (37 C) 97.8 F (36.6 C) 97.9 F (36.6 C) 99.6 F (37.6 C)  TempSrc: Oral Oral Oral Oral  SpO2: 96% 96% 97% 96%  Weight: 120 kg  119.7 kg   Height: 5\' 9"  (1.753 m)        Author: Lynden Oxford, MD 05/17/2023 5:58 PM  Please look on [email or web address] to find out who is on call.

## 2023-05-17 NOTE — Plan of Care (Signed)
  Problem: Health Behavior/Discharge Planning: Goal: Ability to manage health-related needs will improve Outcome: Progressing   Problem: Clinical Measurements: Goal: Diagnostic test results will improve Outcome: Progressing Goal: Respiratory complications will improve Outcome: Progressing   

## 2023-05-17 NOTE — Progress Notes (Signed)
  Echocardiogram 2D Echocardiogram has been performed.  Leda Roys RDCS 05/17/2023, 1:29 PM

## 2023-05-17 NOTE — Hospital Course (Signed)
Brief hospital course: PMH of HTN, BPH, HLD, multiple myeloma present to the hospital with complaints of progressively worsening swelling of his legs and shortness of breath for last 2 weeks. Appears to have acute diastolic CHF.  Assessment and Plan: Acute diastolic CHF. Presents with symptoms of significant volume overload, leg swelling, shortness of breath on rest and exertion. Echocardiogram shows preserved EF.  6065%.  Normal RV.  No significant valvular abnormality.  No pericardial effusion. Will treat with IV diuresis and monitor response. Concern is whether his chemotherapy medications are associated with volume overload and therefore currently holding Decadron and Pomalyst.  Multiple myeloma. Chronic thrombocytopenia. Chronic anemia. Medication currently on hold. Appreciate oncology assistance. Will monitor recommendation as CT scan shows evidence of new metastatic lesions.  Right knee swelling. Patient reports of the right knee swelling noticed this morning with redness and warmth. Concern for acute gout. Will perform x-ray. Check ESR CRP uric acid. May require aspiration with orthopedics. Try treating with Toradol.  HTN Blood pressure stable. Will hold losartan and HCTZ while doing IV diuresis.  HLD. On statin continue.  History of PE. On Xarelto for prophylaxis. Monitor.  Essential tremor. On primidone.  Patient has stopped taking this medication as he is concerned that this is related to his shortness of breath. Will recommend to reattempt in the future.  Obesity Body mass index is 38.97 kg/m.  Placing the pt at higher risk of poor outcomes.

## 2023-05-18 ENCOUNTER — Inpatient Hospital Stay (HOSPITAL_COMMUNITY): Payer: Medicare Other

## 2023-05-18 ENCOUNTER — Ambulatory Visit: Payer: Medicare Other | Admitting: Physical Therapy

## 2023-05-18 DIAGNOSIS — R0602 Shortness of breath: Secondary | ICD-10-CM | POA: Diagnosis not present

## 2023-05-18 LAB — CBC
HCT: 40 % (ref 39.0–52.0)
Hemoglobin: 12.9 g/dL — ABNORMAL LOW (ref 13.0–17.0)
MCH: 34.4 pg — ABNORMAL HIGH (ref 26.0–34.0)
MCHC: 32.3 g/dL (ref 30.0–36.0)
MCV: 106.7 fL — ABNORMAL HIGH (ref 80.0–100.0)
Platelets: 120 10*3/uL — ABNORMAL LOW (ref 150–400)
RBC: 3.75 MIL/uL — ABNORMAL LOW (ref 4.22–5.81)
RDW: 14.5 % (ref 11.5–15.5)
WBC: 3.2 10*3/uL — ABNORMAL LOW (ref 4.0–10.5)
nRBC: 0 % (ref 0.0–0.2)

## 2023-05-18 LAB — SEDIMENTATION RATE: Sed Rate: 16 mm/hr (ref 0–16)

## 2023-05-18 LAB — URIC ACID: Uric Acid, Serum: 4.9 mg/dL (ref 3.7–8.6)

## 2023-05-18 MED ORDER — POTASSIUM CHLORIDE CRYS ER 20 MEQ PO TBCR
40.0000 meq | EXTENDED_RELEASE_TABLET | ORAL | Status: AC
  Administered 2023-05-18 (×2): 40 meq via ORAL
  Filled 2023-05-18 (×2): qty 2

## 2023-05-18 MED ORDER — KETOROLAC TROMETHAMINE 15 MG/ML IJ SOLN
15.0000 mg | Freq: Four times a day (QID) | INTRAMUSCULAR | Status: DC
  Administered 2023-05-18 – 2023-05-19 (×2): 15 mg via INTRAVENOUS
  Filled 2023-05-18 (×2): qty 1

## 2023-05-18 NOTE — Progress Notes (Signed)
Mobility Specialist - Progress Note  ( 2L) Pre-mobility: 85 bpm HR, 93% SpO2 During mobility: 94% SpO2 Post-mobility: 101 bpm HR, 95% SPO2   05/18/23 0941  Mobility  Activity Ambulated with assistance in hallway  Level of Assistance Standby assist, set-up cues, supervision of patient - no hands on  Assistive Device Front wheel walker  Distance Ambulated (ft) 350 ft  Range of Motion/Exercises Active  Activity Response Tolerated well  Mobility Referral Yes  $Mobility charge 1 Mobility  Mobility Specialist Start Time (ACUTE ONLY) [id number]  Mobility Specialist Stop Time (ACUTE ONLY) 0940  Mobility Specialist Time Calculation (min) (ACUTE ONLY) 13 min   Pt was found in bed and agreeable to ambulate. C/o pain from R leg prior to ambulating. Encouraged pursed lip breathing throughout session. At EOS returned to bed with all needs met. Call bell in reach.  Billey Chang Mobility Specialist

## 2023-05-18 NOTE — Progress Notes (Signed)
[facility] Progress Note Patient: Jesse Mcclure. ZOX:[id number] DOB: 1951/03/28 DOA: 05/16/2023  DOS: the patient was seen and examined on 05/18/2023  Brief hospital course: PMH of HTN, BPH, HLD, multiple myeloma present to the hospital with complaints of progressively worsening swelling of his legs and shortness of breath for last 2 weeks. Appears to have acute diastolic CHF.  Assessment and Plan: Acute diastolic CHF. Presents with symptoms of significant volume overload, leg swelling, shortness of breath on rest and exertion. Echocardiogram shows preserved EF.  6065%.  Normal RV.  No significant valvular abnormality.  No pericardial effusion. Will treat with IV diuresis and monitor response. Concern is whether his chemotherapy medications are associated with volume overload and therefore currently holding Decadron and Pomalyst.  Multiple myeloma. Chronic thrombocytopenia. Chronic anemia. Medication currently on hold. Appreciate oncology assistance. Will monitor recommendation as CT scan shows evidence of new metastatic lesions.  Right knee swelling. Patient reports of the right knee swelling noticed this morning with redness and warmth. Concern for acute gout. Will perform x-ray. Check ESR CRP uric acid. May require aspiration with orthopedics. Try treating with Toradol.  HTN Blood pressure stable. Will hold losartan and HCTZ while doing IV diuresis.  HLD. On statin continue.  History of PE. On Xarelto for prophylaxis. Monitor.  Essential tremor. On primidone.  Patient has stopped taking this medication as he is concerned that this is related to his shortness of breath. Will recommend to reattempt in the future.  Obesity Body mass index is 38.97 kg/m.  Placing the pt at higher risk of poor outcomes.    Subjective: Breathing better.  Swelling improving.  No nausea vomiting fever no chills.  Reports right knee swelling.  Physical Exam: General: in Mild  distress, No Rash Cardiovascular: S1 and S2 Present, No Murmur Respiratory: Good respiratory effort, Bilateral Air entry present. No Crackles, No wheezes Abdomen: Bowel Sound present, No tenderness Extremities: Improving edema, right knee swelling with warmth and tenderness. Neuro: Alert and oriented x3, no new focal deficit  Data Reviewed: I have Reviewed nursing notes, Vitals, and Lab results. Since last encounter, pertinent lab results CBC and BMP   . I have ordered test including CBC BMP ESR CRP  . I have ordered imaging x-ray knee  .   Disposition: Status is: Inpatient Remains inpatient appropriate because: Needing diuresis.  rivaroxaban (XARELTO) tablet 10 mg Start: 05/17/23 1700 rivaroxaban (XARELTO) tablet 10 mg   Family Communication: Sister at bedside Level of care: Telemetry   Vitals:   05/18/23 0538 05/18/23 1302 05/18/23 1311 05/18/23 1439  BP: 128/72  (!) 137/93 (!) 141/97  Pulse: (!) 58  74 71  Resp: 18  18 18   Temp: 97.8 F (36.6 C)  98.2 F (36.8 C) 99.7 F (37.6 C)  TempSrc: Oral  Oral Oral  SpO2: 95% 93% 96% 94%  Weight: 115.2 kg     Height:         Author: Lynden Oxford, MD 05/18/2023 6:42 PM  Please look on [email or web address] to find out who is on call.

## 2023-05-18 NOTE — Plan of Care (Signed)
  Problem: Education: Goal: Knowledge of General Education information will improve Description: Including pain rating scale, medication(s)/side effects and non-pharmacologic comfort measures Outcome: Progressing   Problem: Health Behavior/Discharge Planning: Goal: Ability to manage health-related needs will improve Outcome: Progressing   Problem: Clinical Measurements: Goal: Will remain free from infection Outcome: Progressing   

## 2023-05-18 NOTE — TOC CM/SW Note (Signed)
Transition of Care [facility]) - Inpatient Brief Assessment   Patient Details  Name: Jesse Mcclure. MRN: [id number] Date of Birth: 08/15/1951  Transition of Care [facility]) CM/SW Contact:    Larrie Kass, LCSW Phone Number: 05/18/2023, 2:39 PM   Clinical Narrative: Transition of Care Department [facility]) has reviewed patient and no TOC needs have been identified at this time. We will continue to monitor patient advancement through interdisciplinary progression rounds. If new patient transition needs arise, please place a TOC consult.   Transition of Care Asessment: Insurance and Status: Insurance coverage has been reviewed Patient has primary care physician: Yes Home environment has been reviewed: yes   Prior/Current Home Services: No current home services Social Determinants of Health Reivew: SDOH reviewed no interventions necessary Readmission risk has been reviewed: Yes Transition of care needs: no transition of care needs at this time

## 2023-05-18 NOTE — Plan of Care (Signed)

## 2023-05-18 NOTE — Progress Notes (Signed)
Jesse Mcclure.   DOB:11-01-1950   WG#:[id number]    ASSESSMENT & PLAN:  Multiple myeloma His disease control has been stable with oral chemotherapy I do not believe his oral chemotherapy is the cause of his congestive heart failure We will put his treatment on hold while he is hospitalized He has appointment to see me at the end of the month for further follow-up  Congestive heart failure/fluid overload Will defer to cardiology for recommendations He appears to be doing well with diuresis  Mild pancytopenia Likely due to his recent treatment Observe only.  He is not symptomatic  Discharge planning Will defer to primary service.  As above, he has appointment to follow in a few weeks with me  All questions were answered. The patient knows to call the clinic with any problems, questions or concerns.   The total time spent in the appointment was 40 minutes encounter with patients including review of chart and various tests results, discussions about plan of care and coordination of care plan  Artis Delay, MD 05/18/2023 7:50 AM  Subjective:  Patient is well-known to me.  He is currently taking chemotherapy with Pomalyst and Ninlaro along with dexamethasone for recurrent multiple myeloma.  He has been hospitalized due to worsening shortness of breath and is found to have signs of congestive heart failure.  He is doing well with diuresis  Objective:  Vitals:   05/17/23 2044 05/18/23 0538  BP: 123/70 128/72  Pulse: 70 (!) 58  Resp: 18 18  Temp: 98.2 F (36.8 C) 97.8 F (36.6 C)  SpO2: 98% 95%     Intake/Output Summary (Last 24 hours) at 05/18/2023 0750 Last data filed at 05/18/2023 0540 Gross per 24 hour  Intake 723 ml  Output 2250 ml  Net -1527 ml    GENERAL:alert, no distress and comfortable SKIN: skin color, texture, turgor are normal, no rashes or significant lesions EYES: normal, Conjunctiva are pink and non-injected, sclera clear OROPHARYNX:no exudate, no erythema  and lips, buccal mucosa, and tongue normal  NECK: supple, thyroid normal size, non-tender, without nodularity LYMPH:  no palpable lymphadenopathy in the cervical, axillary or inguinal LUNGS: clear to auscultation and percussion with normal breathing effort HEART: regular rate & rhythm and no murmurs and no lower extremity edema ABDOMEN:abdomen soft, non-tender and normal bowel sounds Musculoskeletal:no cyanosis of digits and no clubbing  NEURO: alert & oriented x 3 with fluent speech, no focal motor/sensory deficits   Labs:  Recent Labs    01/22/23 0950 03/12/23 0947 04/23/23 0934 05/16/23 1406 05/17/23 0417 05/18/23 0429  NA 142 139 142 136 138 138  K 3.5 3.8 3.6 4.1 3.7 3.1*  CL 106 105 107 101 100 99  CO2 31 28 30 28 27 28   GLUCOSE 163* 132* 126* 82 99 114*  BUN 26* 26* 17 22 24* 26*  CREATININE 0.81 0.85 0.79 0.73 0.80 0.75  CALCIUM 8.8* 8.7* 8.7* 8.9 8.1* 8.0*  GFRNONAA >60 >60 >60 >60 >60 >60  PROT 6.1* 6.2* 6.1*  --   --   --   ALBUMIN 3.9 3.7 3.6  --   --   --   AST 18 17 23   --   --   --   ALT 30 29 29   --   --   --   ALKPHOS 58 57 51  --   --   --   BILITOT 0.8 1.0 0.8  --   --   --  Studies:  ECHOCARDIOGRAM COMPLETE  Result Date: 05/17/2023    ECHOCARDIOGRAM REPORT   Patient Name:   Jesse Mcclure. Date of Exam: 05/17/2023 Medical Rec #:  [id number]           Height:       69.0 in Accession #:    [id number]          Weight:       263.9 lb Date of Birth:  12-04-1950           BSA:          2.324 m Patient Age:    71 years            BP:           131/86 mmHg Patient Gender: M                   HR:           61 bpm. Exam Location:  Inpatient Procedure: 2D Echo, Cardiac Doppler, Color Doppler and Intracardiac            Opacification Agent Indications:    CHF- Acute Diastolic I50.31  History:        Patient has prior history of Echocardiogram examinations, most                 recent 09/17/2021. Risk Factors:Hypertension and Dyslipidemia.  Sonographer:    Harriette Bouillon RDCS Referring Phys: [id number] EMILY B MULLEN IMPRESSIONS  1. Left ventricular ejection fraction, by estimation, is 60 to 65%. The left ventricle has normal function. The left ventricle has no regional wall motion abnormalities. There is mild concentric left ventricular hypertrophy. Left ventricular diastolic parameters are indeterminate.  2. Right ventricular systolic function is normal. The right ventricular size is normal. Tricuspid regurgitation signal is inadequate for assessing PA pressure.  3. Left atrial size was severely dilated.  4. The mitral valve is normal in structure. No evidence of mitral valve regurgitation. No evidence of mitral stenosis.  5. The aortic valve is normal in structure. Aortic valve regurgitation is not visualized. No aortic stenosis is present.  6. The inferior vena cava is normal in size with greater than 50% respiratory variability, suggesting right atrial pressure of 3 mmHg. FINDINGS  Left Ventricle: Left ventricular ejection fraction, by estimation, is 60 to 65%. The left ventricle has normal function. The left ventricle has no regional wall motion abnormalities. Definity contrast agent was given IV to delineate the left ventricular  endocardial borders. The left ventricular internal cavity size was normal in size. There is mild concentric left ventricular hypertrophy. Left ventricular diastolic parameters are indeterminate. Right Ventricle: The right ventricular size is normal. No increase in right ventricular wall thickness. Right ventricular systolic function is normal. Tricuspid regurgitation signal is inadequate for assessing PA pressure. Left Atrium: Left atrial size was severely dilated. Right Atrium: Right atrial size was normal in size. Pericardium: There is no evidence of pericardial effusion. Mitral Valve: The mitral valve is normal in structure. No evidence of mitral valve regurgitation. No evidence of mitral valve stenosis. Tricuspid Valve: The tricuspid valve is  normal in structure. Tricuspid valve regurgitation is not demonstrated. No evidence of tricuspid stenosis. Aortic Valve: The aortic valve is normal in structure. Aortic valve regurgitation is not visualized. No aortic stenosis is present. Pulmonic Valve: The pulmonic valve was normal in structure. Pulmonic valve regurgitation is not visualized. No evidence of pulmonic stenosis. Aorta: The aortic root is normal in  size and structure. Venous: The inferior vena cava is normal in size with greater than 50% respiratory variability, suggesting right atrial pressure of 3 mmHg. IAS/Shunts: No atrial level shunt detected by color flow Doppler.  LEFT VENTRICLE PLAX 2D LVIDd:         4.80 cm   Diastology LVIDs:         3.30 cm   LV e' medial:    6.74 cm/s LV PW:         1.20 cm   LV E/e' medial:  14.8 LV IVS:        1.20 cm   LV e' lateral:   6.74 cm/s LVOT diam:     2.40 cm   LV E/e' lateral: 14.8 LV SV:         91 LV SV Index:   39 LVOT Area:     4.52 cm  IVC IVC diam: 2.30 cm LEFT ATRIUM            Index LA diam:      4.60 cm  1.98 cm/m LA Vol (A2C): 47.8 ml  20.57 ml/m LA Vol (A4C): 163.0 ml 70.13 ml/m  AORTIC VALVE LVOT Vmax:   97.00 cm/s LVOT Vmean:  72.700 cm/s LVOT VTI:    0.202 m  AORTA Ao Root diam: 3.20 cm Ao Asc diam:  2.60 cm MITRAL VALVE MV Area (PHT): 3.60 cm     SHUNTS MV Decel Time: 211 msec     Systemic VTI:  0.20 m MV E velocity: 100.00 cm/s  Systemic Diam: 2.40 cm MV A velocity: 94.90 cm/s MV E/A ratio:  1.05 Kardie Tobb DO Electronically signed by Thomasene Ripple DO Signature Date/Time: 05/17/2023/2:31:18 PM    Final    CT Angio Chest PE W and/or Wo Contrast  Result Date: 05/16/2023 CLINICAL DATA:  Concern for pulmonary embolism. EXAM: CT ANGIOGRAPHY CHEST WITH CONTRAST TECHNIQUE: Multidetector CT imaging of the chest was performed using the standard protocol during bolus administration of intravenous contrast. Multiplanar CT image reconstructions and MIPs were obtained to evaluate the vascular  anatomy. RADIATION DOSE REDUCTION: This exam was performed according to the departmental dose-optimization program which includes automated exposure control, adjustment of the mA and/or kV according to patient size and/or use of iterative reconstruction technique. CONTRAST:  80mL OMNIPAQUE IOHEXOL 350 MG/ML SOLN COMPARISON:  Chest CT dated 09/16/2021. FINDINGS: Cardiovascular: There is no cardiomegaly. Small pericardial effusion measuring 11 mm in thickness. Coronary vascular calcifications noted. Mild atherosclerotic calcification of the thoracic aorta. No aneurysm or dilatation. No pulmonary artery embolus identified. Mediastinum/Nodes: No hilar or mediastinal adenopathy. Small hiatal hernia. The esophagus is grossly unremarkable. No mediastinal fluid collection. Lungs/Pleura: Small bilateral pleural effusions with partial compressive atelectasis of the lower lobes. There is no pneumothorax. There is diffuse interstitial and interlobular septal prominence consistent with edema. Upper Abdomen: No acute abnormality. Musculoskeletal: There is osteopenia with degenerative changes of the spine. Multiple osseous sclerotic lesions most consistent with metastatic disease. Correlation with history of known malignancy recommended. Several old left rib fractures noted. No acute osseous pathology. Review of the MIP images confirms the above findings. IMPRESSION: 1. No CT evidence of pulmonary embolism. 2. Small bilateral pleural effusions with partial compressive atelectasis of the lower lobes. 3. Small pericardial effusion. 4. Multiple osseous sclerotic lesions most consistent with metastatic disease. Correlation with history of known malignancy recommended. 5.  Aortic Atherosclerosis (ICD10-I70.0). Electronically Signed   By: Elgie Collard M.D.   On: 05/16/2023 16:15   DG  Chest 2 View  Result Date: 05/16/2023 CLINICAL DATA:  72 year old male with history of shortness of breath. EXAM: CHEST - 2 VIEW COMPARISON:  Chest  x-ray 04/09/2022. FINDINGS: Compared to the prior study, the heart now appears enlarged. There is cephalization of the pulmonary vasculature and slight indistinctness of the interstitial markings suggestive of mild pulmonary edema. Trace bilateral pleural effusions. No confluent consolidative airspace disease. No pneumothorax. Upper mediastinal contours are within normal limits. IMPRESSION: 1. The appearance of the chest is concerning for potential congestive heart failure, as above. Electronically Signed   By: Trudie Reed M.D.   On: 05/16/2023 13:46

## 2023-05-19 ENCOUNTER — Inpatient Hospital Stay (HOSPITAL_COMMUNITY): Payer: Medicare Other

## 2023-05-19 DIAGNOSIS — R609 Edema, unspecified: Secondary | ICD-10-CM | POA: Diagnosis not present

## 2023-05-19 DIAGNOSIS — R0602 Shortness of breath: Secondary | ICD-10-CM | POA: Diagnosis not present

## 2023-05-19 LAB — SYNOVIAL CELL COUNT + DIFF, W/ CRYSTALS
Eosinophils-Synovial: 0 % (ref 0–1)
Lymphocytes-Synovial Fld: 1 % (ref 0–20)
Monocyte-Macrophage-Synovial Fluid: 20 % — ABNORMAL LOW (ref 50–90)
Neutrophil, Synovial: 79 % — ABNORMAL HIGH (ref 0–25)
WBC, Synovial: 28245 /mm3 — ABNORMAL HIGH (ref 0–200)

## 2023-05-19 MED ORDER — KETOROLAC TROMETHAMINE 15 MG/ML IJ SOLN
15.0000 mg | Freq: Four times a day (QID) | INTRAMUSCULAR | Status: AC | PRN
  Administered 2023-05-19: 15 mg via INTRAVENOUS
  Filled 2023-05-19: qty 1

## 2023-05-19 MED ORDER — PREDNISONE 50 MG PO TABS
50.0000 mg | ORAL_TABLET | Freq: Every day | ORAL | Status: DC
  Administered 2023-05-20 – 2023-05-23 (×4): 50 mg via ORAL
  Filled 2023-05-19 (×4): qty 1

## 2023-05-19 MED ORDER — POTASSIUM CHLORIDE CRYS ER 20 MEQ PO TBCR
40.0000 meq | EXTENDED_RELEASE_TABLET | ORAL | Status: AC
  Administered 2023-05-19 (×2): 40 meq via ORAL
  Filled 2023-05-19 (×2): qty 2

## 2023-05-19 MED ORDER — FUROSEMIDE 10 MG/ML IJ SOLN
40.0000 mg | Freq: Every day | INTRAMUSCULAR | Status: DC
  Administered 2023-05-19: 40 mg via INTRAVENOUS
  Filled 2023-05-19: qty 4

## 2023-05-19 MED ORDER — DICLOFENAC SODIUM 1 % EX GEL
2.0000 g | Freq: Four times a day (QID) | CUTANEOUS | Status: DC
  Administered 2023-05-19 – 2023-05-23 (×3): 2 g via TOPICAL
  Filled 2023-05-19: qty 100

## 2023-05-19 NOTE — Plan of Care (Signed)
  Problem: Education: Goal: Knowledge of General Education information will improve Description: Including pain rating scale, medication(s)/side effects and non-pharmacologic comfort measures Outcome: Progressing   Problem: Health Behavior/Discharge Planning: Goal: Ability to manage health-related needs will improve Outcome: Progressing   Problem: Clinical Measurements: Goal: Ability to maintain clinical measurements within normal limits will improve Outcome: Progressing Goal: Will remain free from infection Outcome: Progressing Goal: Diagnostic test results will improve Outcome: Progressing Goal: Respiratory complications will improve Outcome: Progressing   Problem: Activity: Goal: Risk for activity intolerance will decrease Outcome: Progressing   Problem: Nutrition: Goal: Adequate nutrition will be maintained Outcome: Progressing   Problem: Coping: Goal: Level of anxiety will decrease Outcome: Progressing   Problem: Elimination: Goal: Will not experience complications related to bowel motility Outcome: Progressing Goal: Will not experience complications related to urinary retention Outcome: Progressing   Problem: Pain Managment: Goal: General experience of comfort will improve Outcome: Progressing   Problem: Skin Integrity: Goal: Risk for impaired skin integrity will decrease Outcome: Progressing

## 2023-05-19 NOTE — Plan of Care (Signed)

## 2023-05-19 NOTE — Consult Note (Cosign Needed Addendum)
ORTHOPAEDIC CONSULTATION  REQUESTING PHYSICIAN: Rolly Salter, MD  Chief Complaint: Right knee pain and swelling  HPI: Jesse Mcclure. is a 72 y.o. male with history of hypertension, BPH, hyperlipidemia, DVT/PE after left total knee replacement on Xarelto, multiple myeloma who presented to the [facility] department with swelling of legs and shortness of breath for last 2 weeks.  He was admitted to [facility].  He states he has had right knee pain and swelling admission, no previous history of right knee pain or swelling.  Orthopedics was consulted.  Today states pain is severe worse with movement and better with rest it is diffuse at the right knee.  Denies any history of gout or pseudogout though father who has passed away did have a history of gout.  No recent fevers or chills.  Continues to be on Xarelto for previous DVT/PE after his left total knee replacement performed by Dr. Dion Saucier on 07/15/2021 no complaints of his left knee today.  Walks without an assistive device at baseline.  Denies paresthesias.  Denies calf pain.  Denies trauma to the right knee.  Past Medical History:  Diagnosis Date   BPH (benign prostatic hypertrophy)    Degenerative disc disease    Diverticulosis    Hemochromatosis    HTN (hypertension)    Hyperlipidemia    Multiple myeloma (HCC)    Polio    Status post Nissen fundoplication (without gastrostomy tube) procedure    for hiatal hernia.   Past Surgical History:  Procedure Laterality Date   FOOT SURGERY     HIATAL HERNIA REPAIR     OTHER SURGICAL HISTORY     Cataract Surgery--Both eyes   SKIN BIOPSY Right 04/13/2022   squamous cell carcinoma in stiu arising in actinic keratosis   TOTAL KNEE ARTHROPLASTY Left 07/15/2021   Procedure: TOTAL KNEE ARTHROPLASTY;  Surgeon: Teryl Lucy, MD;  Location: [facility] ORS;  Service: Orthopedics;  Laterality: Left;   Undescended testes Right 10/05/1966   Social History   Socioeconomic History    Marital status: Single    Spouse name: Not on file   Number of children: Not on file   Years of education: Not on file   Highest education level: Not on file  Occupational History   Not on file  Tobacco Use   Smoking status: Never   Smokeless tobacco: Never  Vaping Use   Vaping status: Never Used  Substance and Sexual Activity   Alcohol use: Not Currently    Alcohol/week: 4.0 standard drinks of alcohol    Types: 4 drink(s) per week    Comment: occasionally   Drug use: No   Sexual activity: Yes  Other Topics Concern   Not on file  Social History Narrative   Not on file   Social Determinants of Health   Financial Resource Strain: Low Risk  (12/29/2022)   Overall Financial Resource Strain (CARDIA)    Difficulty of Paying Living Expenses: Not hard at all  Food Insecurity: No Food Insecurity (05/16/2023)   Hunger Vital Sign    Worried About Running Out of Food in the Last Year: Never true    Ran Out of Food in the Last Year: Never true  Transportation Needs: No Transportation Needs (05/16/2023)   PRAPARE - Administrator, Civil Service (Medical): No    Lack of Transportation (Non-Medical): No  Physical Activity: Insufficiently Active (12/29/2022)   Exercise Vital Sign    Days of Exercise per Week: 2  days    Minutes of Exercise per Session: 20 min  Stress: No Stress Concern Present (12/29/2022)   [employer] of Occupational Health - Occupational Stress Questionnaire    Feeling of Stress : Not at all  Social Connections: Not on file   Family History  Problem Relation Age of Onset   Breast cancer Mother    Arrhythmia Father    Heart failure Father    Breast cancer Sister    No Known Allergies   Positive ROS: All other systems have been reviewed and were otherwise negative with the exception of those mentioned in the HPI and as above.  Physical Exam: General: Alert, no acute distress Cardiovascular: + Pedal edema Respiratory: No cyanosis, no use of  accessory musculature GI: No organomegaly, abdomen is soft and non-tender Skin: No lesions in the area of chief complaint Neurologic: Sensation intact distally Psychiatric: Patient is competent for consent with normal mood and affect Lymphatic: No axillary or cervical lymphadenopathy  MUSCULOSKELETAL: Able to perform 0 to 90 degrees of active active range of motion of the knee today though this does cause pain.  Significant right knee effusion.  Knee warm to the touch but not red on exam.  Stable to varus and valgus stress.  Positive medial and lateral joint line tenderness.  Dorsiflexion plantarflexion intact at the ankle.  Sensation intact to all aspects of the right foot.  Able to flex and extend toes.  Calf nontender and compressible.  Imaging: 4 views of the right knee taken today shows generally well-maintained joint space in the medial lateral compartments.  He does have degenerative changes in the patellofemoral compartment seen on lateral view.  Assessment: Right knee pain and swelling in patient with acute CHF exacerbation  Plan: Differential includes gout, pseudogout, arthritic flare, hemarthrosis.  Septic arthritis highly unlikely.  Will plan for right knee aspiration with cortisone injection.  I will send off knee aspirate for Gram stain, culture, crystals, analysis.  Will continue to follow.  Patient can weight-bear as tolerated, and can perform active range of motion at the right knee as tolerated. Will continue to follow.  PRE-PROCEDURE DIAGNOSIS:  Right knee effusion(s) POST-OPERATIVE DIAGNOSIS:  Same PROCEDURE:  Aspiration / Intra-articular injection right knee  PROCEDURE DETAILS:  After informed verbal consent was obtained the superolateral portal was prepped with chlorhexidine. Approximately 3 mL of lidocaine were injected superficially and then into the deeper tissues and into the joint space. Flash identified appropriate positioning and pressurized joint space. Medicine  was left to sit for several minutes to take effect. Next, a 20 mL syringe with an 18-gauge needle was used to aspirate approximately 23 ml of normal appearing joint fluid. This was replaced by a 60 mL syringe which was used to aspirate approximately 20 mL of normal appearing joint fluid. 2 ml of Marcaine and 40 mg kenalog was then injected into the joint space. He tolerated this well without complication and a Band-Aid was placed.This first syringe of joint fluid will be sent to lab for analysis.  05/19/2023 1:19 PM   Armida Sans, PA-C

## 2023-05-19 NOTE — Progress Notes (Signed)
Bilateral lower extremity venous duplex has been completed. Preliminary results can be found in CV Proc through chart review.   05/19/23 2:30 PM Olen Cordial RVT

## 2023-05-19 NOTE — Progress Notes (Signed)
Mobility Specialist - Progress Note  Pre-mobility: 81 bpm HR, 93% SpO2 ([place] 1L) During mobility: 114 bpm HR, 91% SpO2 ([place] 2L) Post-mobility: 95 bpm HR, 93% SPO2 ([place] 1L)    05/19/23 1218  Mobility  Activity Ambulated with assistance in hallway  Level of Assistance Standby assist, set-up cues, supervision of patient - no hands on  Assistive Device Front wheel walker  Distance Ambulated (ft) 250 ft  Range of Motion/Exercises Active  Activity Response Tolerated fair  Mobility Referral Yes  $Mobility charge 1 Mobility  Mobility Specialist Start Time (ACUTE ONLY) 1205  Mobility Specialist Stop Time (ACUTE ONLY) 1218  Mobility Specialist Time Calculation (min) (ACUTE ONLY) 13 min   Pt was found in bed wanting to ambulate per RN. Pt stated feeling his R knee being stiff and felt a little SOB. At EOS stated R knee was warm to touch and flared up. Returned to bed with all needs met and call bell in reach. RN notified of session.  Billey Chang Mobility Specialist

## 2023-05-19 NOTE — Progress Notes (Signed)
[facility] Progress Note Patient: Jesse Mcclure. VOH:[id number] DOB: 11/08/1950 DOA: 05/16/2023  DOS: the patient was seen and examined on 05/19/2023  Brief hospital course: PMH of HTN, BPH, HLD, multiple myeloma present to the hospital with complaints of progressively worsening swelling of his legs and shortness of breath for last 2 weeks. Appears to have acute diastolic CHF.  Assessment and Plan: Acute diastolic CHF. Presents with symptoms of significant volume overload, leg swelling, shortness of breath on rest and exertion. Echocardiogram shows preserved EF.  6065%.  Normal RV.  No significant valvular abnormality.  No pericardial effusion. Concern is whether his chemotherapy medications are associated with volume overload and therefore currently holding Pomalyst. Bilateral lower extremity Doppler negative for DVT. Continue IV Lasix.  Multiple myeloma. Chronic thrombocytopenia. Chronic anemia. Medication currently on hold. Appreciate oncology assistance. Will monitor recommendation as CT scan shows evidence of new metastatic lesions.  Acute gout Developed right knee swelling.  Noticed on 8/13. X-ray shows small effusion. ESR and CRP elevated.  Uric acid normal. Highly appreciate orthopedic consultation.  Aspiration shows intracellular monosodium urate crystals. Will initiate therapy with prednisone.  As needed Toradol continue. Avoiding colchicine due to risk for pancytopenia in patient with multiple myeloma.  HTN Blood pressure stable. Will hold losartan and HCTZ while doing IV diuresis.  HLD. On statin continue.  History of PE. On Xarelto for prophylaxis. Monitor.  Essential tremor. On primidone.  Patient has stopped taking this medication as he is concerned that this is related to his shortness of breath. Will recommend to reattempt in the future.  Obesity Body mass index is 38.97 kg/m.  Placing the pt at higher risk of poor outcomes.    Subjective:  No nausea no vomiting no fever no chills.  Knee pain improving.  Physical Exam: General: in Mild distress, No Rash Cardiovascular: S1 and S2 Present, No Murmur Respiratory: Good respiratory effort, Bilateral Air entry present. No Crackles, No wheezes Abdomen: Bowel Sound present, No tenderness Extremities: Bilateral right more than left edema, significant warmth and redness of the right knee still present although improvement in swelling. Neuro: Alert and oriented x3, no new focal deficit  Data Reviewed: I have Reviewed nursing notes, Vitals, and Lab results. Since last encounter, pertinent lab results CBC and BMP   . I have ordered test including CBC and BMP  . I have discussed pt's care plan and test results with orthopedics  .   Disposition: Status is: Inpatient Remains inpatient appropriate because: Requiring diuresis  rivaroxaban (XARELTO) tablet 10 mg Start: 05/17/23 1700 rivaroxaban (XARELTO) tablet 10 mg   Family Communication: No one at bedside Level of care: Telemetry   Vitals:   05/19/23 0528 05/19/23 1137 05/19/23 1138 05/19/23 1357  BP: 129/71 (!) 141/86  (!) 145/88  Pulse: 64 84 86 96  Resp:  19  20  Temp: 98.3 F (36.8 C) 98.9 F (37.2 C)  97.9 F (36.6 C)  TempSrc: Oral Oral  Oral  SpO2: 97% 95% 96% 94%  Weight: 114.6 kg     Height:         Author: Lynden Oxford, MD 05/19/2023 6:25 PM  Please look on [email or web address] to find out who is on call.

## 2023-05-20 ENCOUNTER — Ambulatory Visit: Payer: Medicare Other | Admitting: Physical Therapy

## 2023-05-20 DIAGNOSIS — I509 Heart failure, unspecified: Secondary | ICD-10-CM | POA: Diagnosis not present

## 2023-05-20 DIAGNOSIS — C9000 Multiple myeloma not having achieved remission: Secondary | ICD-10-CM | POA: Diagnosis not present

## 2023-05-20 DIAGNOSIS — R0602 Shortness of breath: Secondary | ICD-10-CM | POA: Diagnosis not present

## 2023-05-20 DIAGNOSIS — R0902 Hypoxemia: Secondary | ICD-10-CM

## 2023-05-20 LAB — CBC
HCT: 40.7 % (ref 39.0–52.0)
Hemoglobin: 13.6 g/dL (ref 13.0–17.0)
MCH: 35 pg — ABNORMAL HIGH (ref 26.0–34.0)
MCHC: 33.4 g/dL (ref 30.0–36.0)
MCV: 104.6 fL — ABNORMAL HIGH (ref 80.0–100.0)
Platelets: 154 10*3/uL (ref 150–400)
RBC: 3.89 MIL/uL — ABNORMAL LOW (ref 4.22–5.81)
RDW: 13.9 % (ref 11.5–15.5)
WBC: 4 10*3/uL (ref 4.0–10.5)
nRBC: 0 % (ref 0.0–0.2)

## 2023-05-20 LAB — BASIC METABOLIC PANEL
Anion gap: 7 (ref 5–15)
BUN: 30 mg/dL — ABNORMAL HIGH (ref 8–23)
CO2: 29 mmol/L (ref 22–32)
Calcium: 8.2 mg/dL — ABNORMAL LOW (ref 8.9–10.3)
Chloride: 99 mmol/L (ref 98–111)
Creatinine, Ser: 0.72 mg/dL (ref 0.61–1.24)
GFR, Estimated: >60 mL/min (ref >60)
Glucose, Bld: 141 mg/dL — ABNORMAL HIGH (ref 70–99)
Potassium: 4 mmol/L (ref 3.5–5.1)
Sodium: 135 mmol/L (ref 135–145)

## 2023-05-20 LAB — MAGNESIUM: Magnesium: 2.5 mg/dL — ABNORMAL HIGH (ref 1.7–2.4)

## 2023-05-20 MED ORDER — FUROSEMIDE 10 MG/ML IJ SOLN
40.0000 mg | Freq: Two times a day (BID) | INTRAMUSCULAR | Status: AC
  Administered 2023-05-20 – 2023-05-22 (×6): 40 mg via INTRAVENOUS
  Filled 2023-05-20 (×6): qty 4

## 2023-05-20 NOTE — Progress Notes (Signed)
[facility]                                                                              Jesse Mcclure, is a 72 y.o. adult, DOB - 1951-10-05, WUJ:[id number] Admit date - 05/16/2023    Outpatient Primary MD for the patient is Jesse Nian, MD  LOS - 3  days  Chief Complaint  Patient presents with   Shortness of Breath       Brief summary   PMH of HTN, BPH, HLD, multiple myeloma present to the hospital with complaints of progressively worsening swelling of his legs and shortness of breath for last 2 weeks. Appears to have acute diastolic CHF.   Assessment & Plan    Principal Problem: Acute diastolic CHF. - significant volume overload, leg swelling, shortness of breath on rest and exertion on presentation. -2D echo showed EF 60-65%.  Normal RV, no significant valvular abnormality, no pericardial effusion.  -Per oncology, do not believe his oral chemotherapy is the cause of CHF.  However treatment on hold while hospitalized -Bilateral lower extremity Doppler negative for DVT. -Still significant volume overload with lower extremity edema, increase IV Lasix to 40 mg twice daily, follow renal function    Active problems Multiple myeloma. Chronic thrombocytopenia, Chronic anemia. -Oral chemotherapy on hold, appreciate oncology follow-up by Dr. Bertis Mcclure - CT scan shows evidence of new metastatic lesions, has appointment outpatient with oncology.   Acute gout, right knee swelling - X-ray shows small effusion. ESR and CRP elevated.  Uric acid normal. -Orthopedics consulted, aspiration shows intracellular monosodium urate crystals. -Continue prednisone 50 mg daily, will taper on discharge.  Continue Toradol as needed - Avoiding colchicine due to risk for pancytopenia in patient with multiple myeloma.   HTN BP stable -hold losartan and HCTZ while doing IV diuresis.   HLD. Continues statin   History of PE. Continue Xarelto   Essential tremor. On primidone.   Patient had stopped taking this medication as he is concerned that this is related to his shortness of breath. Will recommend to reattempt in the future.   Obesity Estimated body mass index is 36.79 kg/m as calculated from the following:   Height as of this encounter: 5\' 9"  (1.753 m).   Weight as of this encounter: 113 kg.  Code Status: Full CODE STATUS DVT Prophylaxis:  rivaroxaban (XARELTO) tablet 10 mg Start: 05/17/23 1700 rivaroxaban (XARELTO) tablet 10 mg   Level of Care: Level of care: Telemetry Family Communication: Updated patient Disposition Plan:      Remains inpatient appropriate: Still has significant volume overload with lower extremity edema, will likely need 1 or 2 days of IV aggressive diuresis   Procedures:    Consultants:   None  Antimicrobials:   Anti-infectives (From admission, onward)    Start     Dose/Rate Route Frequency Ordered Stop   05/17/23 1000  acyclovir (ZOVIRAX) tablet 400 mg        400 mg Oral Daily 05/16/23 2002            Medications  acyclovir  400 mg Oral Daily   atorvastatin  20 mg Oral Daily  diclofenac Sodium  2 g Topical QID   folic acid  0.5 mg Oral Daily   furosemide  40 mg Intravenous BID   predniSONE  50 mg Oral Q breakfast   rivaroxaban  10 mg Oral Q supper   sodium chloride flush  3 mL Intravenous Q12H      Subjective:   Jesse Mcclure was seen and examined today.  Still has significant lower extremity swelling, no chest pain, shortness of breath, nausea vomiting or abdominal pain.  No acute events overnight.  No fevers.  Objective:   Vitals:   05/19/23 1357 05/19/23 2037 05/20/23 0437 05/20/23 1231  BP: (!) 145/88 135/72 139/84 130/84  Pulse: 96 74 66 67  Resp: 20   18  Temp: 97.9 F (36.6 C) 98.2 F (36.8 C) 98.3 F (36.8 C) 97.9 F (36.6 C)  TempSrc: Oral Oral Oral Oral  SpO2: 94% 98% 97% 93%  Weight:   113 kg   Height:        Intake/Output Summary (Last 24 hours) at 05/20/2023 1250 Last data  filed at 05/20/2023 1233 Gross per 24 hour  Intake 343 ml  Output 1500 ml  Net -1157 ml     Wt Readings from Last 3 Encounters:  05/20/23 113 kg  05/12/23 118.1 kg  05/11/23 118.5 kg     Exam General: Alert and oriented x 3, NAD Cardiovascular: S1 S2 auscultated,  RRR Respiratory: Clear to auscultation bilaterally Gastrointestinal: Soft, nontender, nondistended, + bowel sounds Ext: 2+  pedal edema bilaterally Neuro: no new deficits Psych: Normal affect     Data Reviewed:  I have personally reviewed following labs    CBC Lab Results  Component Value Date   WBC 4.0 05/20/2023   RBC 3.89 (L) 05/20/2023   HGB 13.6 05/20/2023   HCT 40.7 05/20/2023   MCV 104.6 (H) 05/20/2023   MCH 35.0 (H) 05/20/2023   PLT 154 05/20/2023   MCHC 33.4 05/20/2023   RDW 13.9 05/20/2023   LYMPHSABS 1.4 05/16/2023   MONOABS 1.2 (H) 05/16/2023   EOSABS 0.2 05/16/2023   BASOSABS 0.0 05/16/2023     Last metabolic panel Lab Results  Component Value Date   NA 135 05/20/2023   K 4.0 05/20/2023   CL 99 05/20/2023   CO2 29 05/20/2023   BUN 30 (H) 05/20/2023   CREATININE 0.72 05/20/2023   GLUCOSE 141 (H) 05/20/2023   GFRNONAA >60 05/20/2023   GFRAA >60 06/14/2020   CALCIUM 8.2 (L) 05/20/2023   PHOS 2.5 09/18/2021   PROT 6.1 (L) 04/23/2023   ALBUMIN 3.6 04/23/2023   LABGLOB 2.4 04/23/2023   AGRATIO 1.9 10/11/2018   BILITOT 0.8 04/23/2023   ALKPHOS 51 04/23/2023   AST 23 04/23/2023   ALT 29 04/23/2023   ANIONGAP 7 05/20/2023    CBG (last 3)  No results for input(s): "GLUCAP" in the last 72 hours.    Coagulation Profile: No results for input(s): "INR", "PROTIME" in the last 168 hours.   Radiology Studies: I have personally reviewed the imaging studies  VAS [place] LOWER EXTREMITY VENOUS (DVT)  Result Date: 05/20/2023  Lower Venous DVT Study Patient Name:  Jesse Mcclure.  Date of Exam:   05/19/2023 Medical Rec #: [id number]            Accession #:    [id number] Date of Birth:  06-27-1951            Patient Gender: M Patient Age:   89 years Exam Location:  [facility] Procedure:      VAS [place] LOWER EXTREMITY VENOUS (DVT) Referring Phys: PRANAV PATEL --------------------------------------------------------------------------------  Indications: Edema.  Risk Factors: None identified. Comparison Study: No prior studies. Performing Technologist: Chanda Busing RVT  Examination Guidelines: A complete evaluation includes B-mode imaging, spectral Doppler, color Doppler, and power Doppler as needed of all accessible portions of each vessel. Bilateral testing is considered an integral part of a complete examination. Limited examinations for reoccurring indications may be performed as noted. The reflux portion of the exam is performed with the patient in reverse Trendelenburg.  +---------+---------------+---------+-----------+----------+--------------+ RIGHT    CompressibilityPhasicitySpontaneityPropertiesThrombus Aging +---------+---------------+---------+-----------+----------+--------------+ CFV      Full           Yes      Yes                                 +---------+---------------+---------+-----------+----------+--------------+ SFJ      Full                                                        +---------+---------------+---------+-----------+----------+--------------+ FV Prox  Full                                                        +---------+---------------+---------+-----------+----------+--------------+ FV Mid   Full                                                        +---------+---------------+---------+-----------+----------+--------------+ FV DistalFull                                                        +---------+---------------+---------+-----------+----------+--------------+ PFV      Full                                                         +---------+---------------+---------+-----------+----------+--------------+ POP      Full           Yes      Yes                                 +---------+---------------+---------+-----------+----------+--------------+ PTV      Full                                                        +---------+---------------+---------+-----------+----------+--------------+ PERO     Full                                                        +---------+---------------+---------+-----------+----------+--------------+   +---------+---------------+---------+-----------+----------+--------------+  LEFT     CompressibilityPhasicitySpontaneityPropertiesThrombus Aging +---------+---------------+---------+-----------+----------+--------------+ CFV      Full           Yes      Yes                                 +---------+---------------+---------+-----------+----------+--------------+ SFJ      Full                                                        +---------+---------------+---------+-----------+----------+--------------+ FV Prox  Full                                                        +---------+---------------+---------+-----------+----------+--------------+ FV Mid   Full                                                        +---------+---------------+---------+-----------+----------+--------------+ FV DistalFull                                                        +---------+---------------+---------+-----------+----------+--------------+ PFV      Full                                                        +---------+---------------+---------+-----------+----------+--------------+ POP      Full           Yes      Yes                                 +---------+---------------+---------+-----------+----------+--------------+ PTV      Full                                                         +---------+---------------+---------+-----------+----------+--------------+ PERO     Full                                                        +---------+---------------+---------+-----------+----------+--------------+     Summary: RIGHT: - There is no evidence of deep vein thrombosis in the lower extremity.  - No cystic structure found in the popliteal fossa.  LEFT: - There is no evidence of deep vein thrombosis in the lower extremity.  - No  cystic structure found in the popliteal fossa.  *See table(s) above for measurements and observations. Electronically signed by Sherald Hess MD on 05/20/2023 at 9:45:20 AM.    Final    DG Knee Complete 4 Views Right  Result Date: 05/18/2023 CLINICAL DATA:  Knee swelling EXAM: RIGHT KNEE - COMPLETE 4 VIEW COMPARISON:  None Available. FINDINGS: Osteopenia. Small osteophytes of the patellofemoral joint and lateral compartment. There is joint space loss of the patellofemoral joint. There is a well corticated density above the patellofemoral joint and slightly lateral. Please correlate for any previous remote injury or other soft tissue ossification. Otherwise no acute fracture or dislocation. Small joint effusion. IMPRESSION: Degenerative changes.  Greatest of the patellofemoral joint. Small joint effusion. Well corticated ossific density seen above the patella on lateral view. This could be the sequela of old trauma. Please correlate for any prior imaging. If there is further concern additional evaluation such as CT as clinically appropriate or MRI. Electronically Signed   By: Karen Kays M.D.   On: 05/18/2023 18:57         M.D. [facility] 05/20/2023, 12:50 PM  Available via Epic secure chat 7am-7pm After 7 pm, please refer to night coverage provider listed on amion.

## 2023-05-20 NOTE — Plan of Care (Signed)
Reinforce education. Monitor O2 sats. Activity as tolerated. Side bar

## 2023-05-20 NOTE — Progress Notes (Signed)
Patient seen and examined. States knee feels improved since aspiration and injection. ROM 0-100 deg while supine today without significant pain.   No organisms seen in aspirate, aspirate did contain monosodium urate crystals indicative of acute gout. Cortisone shot given yesterday which seems to have provided some relief. He has been started on oral prednisone this morning, recommend discharging home on taper, colchicine currently being avoided due to other medical issues. OK to work with PT as he is able.   Ortho will sign off. Please reach out for further questions or concerns. He can follow-up with Dr. Dion Saucier as needed.   Janine Ores, PA-C

## 2023-05-21 ENCOUNTER — Ambulatory Visit: Payer: Medicare Other | Admitting: Hematology and Oncology

## 2023-05-21 ENCOUNTER — Other Ambulatory Visit: Payer: Medicare Other

## 2023-05-21 DIAGNOSIS — I5030 Unspecified diastolic (congestive) heart failure: Secondary | ICD-10-CM

## 2023-05-21 DIAGNOSIS — R0902 Hypoxemia: Secondary | ICD-10-CM | POA: Diagnosis not present

## 2023-05-21 DIAGNOSIS — R0602 Shortness of breath: Secondary | ICD-10-CM | POA: Diagnosis not present

## 2023-05-21 DIAGNOSIS — C9000 Multiple myeloma not having achieved remission: Secondary | ICD-10-CM | POA: Diagnosis not present

## 2023-05-21 DIAGNOSIS — I509 Heart failure, unspecified: Secondary | ICD-10-CM | POA: Diagnosis not present

## 2023-05-21 LAB — BASIC METABOLIC PANEL
Anion gap: 13 (ref 5–15)
BUN: 34 mg/dL — ABNORMAL HIGH (ref 8–23)
CO2: 24 mmol/L (ref 22–32)
Calcium: 8.3 mg/dL — ABNORMAL LOW (ref 8.9–10.3)
Chloride: 99 mmol/L (ref 98–111)
Creatinine, Ser: 0.85 mg/dL (ref 0.61–1.24)
GFR, Estimated: >60 mL/min (ref >60)
Glucose, Bld: 111 mg/dL — ABNORMAL HIGH (ref 70–99)
Potassium: 3.6 mmol/L (ref 3.5–5.1)
Sodium: 136 mmol/L (ref 135–145)

## 2023-05-21 NOTE — Progress Notes (Signed)
[facility]                                                                              Jesse Mcclure, is a 72 y.o. adult, DOB - 01-28-1951, AOZ:[id number] Admit date - 05/16/2023    Outpatient Primary MD for the patient is Ronnald Nian, MD  LOS - 4  days  Chief Complaint  Patient presents with   Shortness of Breath       Brief summary   PMH of HTN, BPH, HLD, multiple myeloma present to the hospital with complaints of progressively worsening swelling of his legs and shortness of breath for last 2 weeks. Appears to have acute diastolic CHF.   Assessment & Plan    Principal Problem: Acute diastolic CHF. - significant volume overload, leg swelling, shortness of breath on rest and exertion on presentation. -2D echo showed EF 60-65%.  Normal RV, no significant valvular abnormality, no pericardial effusion.  -Per oncology, do not believe his oral chemotherapy is the cause of CHF.  However treatment on hold while hospitalized -Bilateral lower extremity Doppler negative for DVT. -LE edema improving, continue IV Lasix 40 mg twice daily, -Strict I's and O's and daily weights, negative balance of 7.8 L, weight down from 264.5 lbs on admission to 244 today -Follow renal function closely    Active problems Multiple myeloma. Chronic thrombocytopenia, Chronic anemia. -Oral chemotherapy on hold, appreciate oncology follow-up by Dr. Bertis Ruddy - CT scan shows evidence of new metastatic lesions, has appointment outpatient with oncology.   Acute gout, right knee swelling - X-ray shows small effusion. ESR and CRP elevated.  Uric acid normal. - Orthopedics consulted, aspiration shows intracellular monosodium urate crystals. - Continue prednisone 50 mg daily, will taper on discharge.  Continue Toradol as needed - Avoiding colchicine due to risk for pancytopenia in patient with multiple myeloma.   HTN BP stable -hold losartan and HCTZ while doing IV diuresis. -Will  continue losartan and DC HCTZ at discharge as likely patient will be on oral Lasix   HLD. Continues statin   History of PE. Continue Xarelto   Essential tremor. On primidone.  Patient had stopped taking this medication as he is concerned that this is related to his shortness of breath. Will recommend to reattempt in the future.   Obesity Estimated body mass index is 36.14 kg/m as calculated from the following:   Height as of this encounter: 5\' 9"  (1.753 m).   Weight as of this encounter: 111 kg.  Code Status: Full CODE STATUS DVT Prophylaxis:  rivaroxaban (XARELTO) tablet 10 mg Start: 05/17/23 1700 rivaroxaban (XARELTO) tablet 10 mg   Level of Care: Level of care: Telemetry Family Communication: Updated patient Disposition Plan:      Remains inpatient appropriate: Lower extremity edema improving, hopefully DC home in 24 to 48 hours, continue IV diuresis   Procedures:    Consultants:   None  Antimicrobials:   Anti-infectives (From admission, onward)    Start     Dose/Rate Route Frequency Ordered Stop   05/17/23 1000  acyclovir (ZOVIRAX) tablet 400 mg        400 mg Oral Daily  05/16/23 2002            Medications  acyclovir  400 mg Oral Daily   atorvastatin  20 mg Oral Daily   diclofenac Sodium  2 g Topical QID   folic acid  0.5 mg Oral Daily   furosemide  40 mg Intravenous BID   predniSONE  50 mg Oral Q breakfast   rivaroxaban  10 mg Oral Q supper   sodium chloride flush  3 mL Intravenous Q12H      Subjective:   Jesse Mcclure was seen and examined today.  Lower extremity swelling improving, however still has significant edema.  No acute chest pain, shortness of breath, fevers, nausea or vomiting.   Objective:   Vitals:   05/20/23 1231 05/20/23 2138 05/21/23 0540 05/21/23 1332  BP: 130/84 (!) 142/86 127/87 133/83  Pulse: 67 64 (!) 58 68  Resp: 18 14 20    Temp: 97.9 F (36.6 C) 97.8 F (36.6 C) 97.8 F (36.6 C) 97.9 F (36.6 C)  TempSrc: Oral  Oral Oral Oral  SpO2: 93% 94% 97% 94%  Weight:   111 kg   Height:        Intake/Output Summary (Last 24 hours) at 05/21/2023 1401 Last data filed at 05/21/2023 1200 Gross per 24 hour  Intake 343 ml  Output 2300 ml  Net -1957 ml     Wt Readings from Last 3 Encounters:  05/21/23 111 kg  05/12/23 118.1 kg  05/11/23 118.5 kg   Physical Exam General: Alert and oriented x 3, NAD Cardiovascular: S1 S2 clear, RRR.  Respiratory: CTAB Gastrointestinal: Soft, nontender, nondistended, NBS Ext: 2+ pedal edema bilaterally Neuro: no new deficits Psych: Normal affect     Data Reviewed:  I have personally reviewed following labs    CBC Lab Results  Component Value Date   WBC 4.0 05/20/2023   RBC 3.89 (L) 05/20/2023   HGB 13.6 05/20/2023   HCT 40.7 05/20/2023   MCV 104.6 (H) 05/20/2023   MCH 35.0 (H) 05/20/2023   PLT 154 05/20/2023   MCHC 33.4 05/20/2023   RDW 13.9 05/20/2023   LYMPHSABS 1.4 05/16/2023   MONOABS 1.2 (H) 05/16/2023   EOSABS 0.2 05/16/2023   BASOSABS 0.0 05/16/2023     Last metabolic panel Lab Results  Component Value Date   NA 136 05/21/2023   K 3.6 05/21/2023   CL 99 05/21/2023   CO2 24 05/21/2023   BUN 34 (H) 05/21/2023   CREATININE 0.85 05/21/2023   GLUCOSE 111 (H) 05/21/2023   GFRNONAA >60 05/21/2023   GFRAA >60 06/14/2020   CALCIUM 8.3 (L) 05/21/2023   PHOS 2.5 09/18/2021   PROT 6.1 (L) 04/23/2023   ALBUMIN 3.6 04/23/2023   LABGLOB 2.4 04/23/2023   AGRATIO 1.9 10/11/2018   BILITOT 0.8 04/23/2023   ALKPHOS 51 04/23/2023   AST 23 04/23/2023   ALT 29 04/23/2023   ANIONGAP 13 05/21/2023    CBG (last 3)  No results for input(s): "GLUCAP" in the last 72 hours.    Coagulation Profile: No results for input(s): "INR", "PROTIME" in the last 168 hours.   Radiology Studies: I have personally reviewed the imaging studies  VAS [place] LOWER EXTREMITY VENOUS (DVT)  Result Date: 05/20/2023  Lower Venous DVT Study Patient Name:  Jesse Mcclure.   Date of Exam:   05/19/2023 Medical Rec #: [id number]            Accession #:    [id number] Date of Birth: 1951/07/05  Patient Gender: M Patient Age:   63 years Exam Location:  [facility] Procedure:      VAS [place] LOWER EXTREMITY VENOUS (DVT) Referring Phys: PRANAV PATEL --------------------------------------------------------------------------------  Indications: Edema.  Risk Factors: None identified. Comparison Study: No prior studies. Performing Technologist: Chanda Busing RVT  Examination Guidelines: A complete evaluation includes B-mode imaging, spectral Doppler, color Doppler, and power Doppler as needed of all accessible portions of each vessel. Bilateral testing is considered an integral part of a complete examination. Limited examinations for reoccurring indications may be performed as noted. The reflux portion of the exam is performed with the patient in reverse Trendelenburg.  +---------+---------------+---------+-----------+----------+--------------+ RIGHT    CompressibilityPhasicitySpontaneityPropertiesThrombus Aging +---------+---------------+---------+-----------+----------+--------------+ CFV      Full           Yes      Yes                                 +---------+---------------+---------+-----------+----------+--------------+ SFJ      Full                                                        +---------+---------------+---------+-----------+----------+--------------+ FV Prox  Full                                                        +---------+---------------+---------+-----------+----------+--------------+ FV Mid   Full                                                        +---------+---------------+---------+-----------+----------+--------------+ FV DistalFull                                                        +---------+---------------+---------+-----------+----------+--------------+ PFV      Full                                                         +---------+---------------+---------+-----------+----------+--------------+ POP      Full           Yes      Yes                                 +---------+---------------+---------+-----------+----------+--------------+ PTV      Full                                                        +---------+---------------+---------+-----------+----------+--------------+  PERO     Full                                                        +---------+---------------+---------+-----------+----------+--------------+   +---------+---------------+---------+-----------+----------+--------------+ LEFT     CompressibilityPhasicitySpontaneityPropertiesThrombus Aging +---------+---------------+---------+-----------+----------+--------------+ CFV      Full           Yes      Yes                                 +---------+---------------+---------+-----------+----------+--------------+ SFJ      Full                                                        +---------+---------------+---------+-----------+----------+--------------+ FV Prox  Full                                                        +---------+---------------+---------+-----------+----------+--------------+ FV Mid   Full                                                        +---------+---------------+---------+-----------+----------+--------------+ FV DistalFull                                                        +---------+---------------+---------+-----------+----------+--------------+ PFV      Full                                                        +---------+---------------+---------+-----------+----------+--------------+ POP      Full           Yes      Yes                                 +---------+---------------+---------+-----------+----------+--------------+ PTV      Full                                                         +---------+---------------+---------+-----------+----------+--------------+ PERO     Full                                                        +---------+---------------+---------+-----------+----------+--------------+  Summary: RIGHT: - There is no evidence of deep vein thrombosis in the lower extremity.  - No cystic structure found in the popliteal fossa.  LEFT: - There is no evidence of deep vein thrombosis in the lower extremity.  - No cystic structure found in the popliteal fossa.  *See table(s) above for measurements and observations. Electronically signed by Sherald Hess MD on 05/20/2023 at 9:45:20 AM.    Final        Thad Ranger M.D. [facility] 05/21/2023, 2:01 PM  Available via Epic secure chat 7am-7pm After 7 pm, please refer to night coverage provider listed on amion.

## 2023-05-21 NOTE — Plan of Care (Signed)
Reinforce education. Monitor edema. Diuretics as ordered. Monitor labs and vital signs.

## 2023-05-21 NOTE — Consult Note (Signed)
Cardiology Consultation   Patient ID: Jesse Mcclure. MRN: [id number]; DOB: Nov 23, 1950  Admit date: 05/16/2023 Date of Consult: 05/21/2023  PCP:  Ronnald Nian, MD   [facility] Providers Cardiologist:  None        Patient Profile:   Jesse Winns. is a 72 y.o. adult with a hx of HTN, BPH, HLD, multiple myeloma, hx of polio,  who is being seen 05/21/2023 for the evaluation of C/f CHF exacerbation at the request of Dr. Isidoro Donning.  History of Present Illness:   Jesse Mcclure per H&P HPI: "He has had swelling in his legs as well which has been coming on for a few weeks now. He denies chest pain, change in urination, dysuria, nausea, vomiting, fever, chills, sick contacts. He notes that he was so short of breath that he could not make it to the bathroom without getting winded. In the ED, he was also noted to become hypoxic when he was ambulating to the restroom. Shortness of breath is worsened with lying flat. He has not awoken with symptoms. He was short of breath when sitting up to the side of the bed on my examination.  Further symptoms include non productive cough and wheezing. "  Here he has been hypoxic to the 80s. Bnp 353. Trops were flat.  EKG showed sinus rhythm with 1st degree AV block. No ischemic changes; precordial leads have low voltage. Cxray c/f congestion. CTA of the chest showed small bilateral pleural effusions, small pericardial effusion, multiple osseus sclerotic lesions in the T spine which were noted to be new.  Echo from 05/17/2023 here shows normal LV function, RV function is normal, no significant valve disease.  He was started on IV diuresis for c/f CHF exacerbation. Net negative total 7L. Crt is normal.   He saw Dr. [place] in 02/2012 for dyspnea. He had a normal stress Cardiolite study in January 2011. Ejection fraction was 67% .   He states he is being diuresed well. His partner sees Dr. Eden Emms and he wanted [facility] cardiology on board.  Past Medical  History:  Diagnosis Date   BPH (benign prostatic hypertrophy)    Degenerative disc disease    Diverticulosis    Hemochromatosis    HTN (hypertension)    Hyperlipidemia    Multiple myeloma (HCC)    Polio    Status post Nissen fundoplication (without gastrostomy tube) procedure    for hiatal hernia.    Past Surgical History:  Procedure Laterality Date   FOOT SURGERY     HIATAL HERNIA REPAIR     OTHER SURGICAL HISTORY     Cataract Surgery--Both eyes   SKIN BIOPSY Right 04/13/2022   squamous cell carcinoma in stiu arising in actinic keratosis   TOTAL KNEE ARTHROPLASTY Left 07/15/2021   Procedure: TOTAL KNEE ARTHROPLASTY;  Surgeon: Teryl Lucy, MD;  Location: [facility];  Service: Orthopedics;  Laterality: Left;   Undescended testes Right 10/05/1966     Home Medications:  Prior to Admission medications   Medication Sig Start Date End Date Taking? Authorizing Provider  acyclovir (ZOVIRAX) 400 MG tablet Take 1 tablet (400 mg total) by mouth daily. 11/10/22  Yes Gorsuch, Ni, MD  atorvastatin (LIPITOR) 20 MG tablet Take 1 tablet (20 mg total) by mouth daily. TAKE 1 TABLET(20 MG) BY MOUTH DAILY 04/27/23  Yes Tysinger, Kermit Balo, PA-C  calcium carbonate (TUMS - DOSED IN MG ELEMENTAL CALCIUM) 500 MG chewable tablet Chew 1 tablet by mouth 2 (two) times  daily.   Yes [provider]  Cholecalciferol (VITAMIN D) 50 MCG (2000 UT) tablet Take 2,000 Units by mouth daily.   Yes [provider]  folic acid (FOLVITE) 400 MCG tablet Take 400 mcg by mouth daily.   Yes [provider]  ixazomib citrate (NINLARO) 3 MG capsule Take 1 capsule (3 mg) by mouth weekly, 3 weeks on, 1 week off, repeat every 4 weeks. Take on an empty stomach 1hr before or 2hr after meals. 01/05/23  Yes Artis Delay, MD  losartan-hydrochlorothiazide (HYZAAR) 100-12.5 MG tablet TAKE 1 TABLET BY MOUTH DAILY 06/30/22  Yes Ronnald Nian, MD  Multiple Vitamin (MULTI-VITAMIN) tablet Take 1 tablet by mouth daily.  08/30/19  Yes [provider]  Polyethyl Glycol-Propyl Glycol (SYSTANE OP) Place 1 drop into both eyes daily as needed (dry eyes).   Yes [provider]  pomalidomide (POMALYST) 3 MG capsule Take 1 capsule (3mg ) by mouth daily for 21 days, rest 7 days, repeat every 28 days. 04/23/23  Yes Gorsuch, Paula Compton, MD  primidone (MYSOLINE) 50 MG tablet Take 1 tablet (50 mg total) by mouth at bedtime. 05/11/23  Yes Vaslow, Georgeanna Lea, MD  prochlorperazine (COMPAZINE) 10 MG tablet TAKE 1 TABLET(10 MG) BY MOUTH EVERY 6 HOURS AS NEEDED FOR NAUSEA OR VOMITING 01/22/23  Yes Artis Delay, MD  rivaroxaban (XARELTO) 10 MG TABS tablet Take 1 tablet (10 mg total) by mouth daily with supper. 04/23/23  Yes Gorsuch, Ni, MD  vitamin B-12 (CYANOCOBALAMIN) 1000 MCG tablet Take 1,000 mcg by mouth daily.   Yes [provider]    Inpatient Medications: Scheduled Meds:  acyclovir  400 mg Oral Daily   atorvastatin  20 mg Oral Daily   diclofenac Sodium  2 g Topical QID   folic acid  0.5 mg Oral Daily   furosemide  40 mg Intravenous BID   predniSONE  50 mg Oral Q breakfast   rivaroxaban  10 mg Oral Q supper   sodium chloride flush  3 mL Intravenous Q12H   Continuous Infusions:  PRN Meds: acetaminophen, albuterol, mouth rinse, oxyCODONE  Allergies:   No Known Allergies  Social History:   Social History   Socioeconomic History   Marital status: Single    Spouse name: Not on file   Number of children: Not on file   Years of education: Not on file   Highest education level: Not on file  Occupational History   Not on file  Tobacco Use   Smoking status: Never   Smokeless tobacco: Never  Vaping Use   Vaping status: Never Used  Substance and Sexual Activity   Alcohol use: Not Currently    Alcohol/week: 4.0 standard drinks of alcohol    Types: 4 drink(s) per week    Comment: occasionally   Drug use: No   Sexual activity: Yes  Other Topics Concern   Not on file  Social History Narrative   Not  on file   Social Determinants of Health   Financial Resource Strain: Low Risk  (12/29/2022)   Overall Financial Resource Strain (CARDIA)    Difficulty of Paying Living Expenses: Not hard at all  Food Insecurity: No Food Insecurity (05/16/2023)   Hunger Vital Sign    Worried About Running Out of Food in the Last Year: Never true    Ran Out of Food in the Last Year: Never true  Transportation Needs: No Transportation Needs (05/16/2023)   PRAPARE - Administrator, Civil Service (Medical): No  Lack of Transportation (Non-Medical): No  Physical Activity: Insufficiently Active (12/29/2022)   Exercise Vital Sign    Days of Exercise per Week: 2 days    Minutes of Exercise per Session: 20 min  Stress: No Stress Concern Present (12/29/2022)   [employer] of Occupational Health - Occupational Stress Questionnaire    Feeling of Stress : Not at all  Social Connections: Not on file  Intimate Partner Violence: Not At Risk (05/16/2023)   Humiliation, Afraid, Rape, and Kick questionnaire    Fear of Current or Ex-Partner: No    Emotionally Abused: No    Physically Abused: No    Sexually Abused: No    Family History:    Family History  Problem Relation Age of Onset   Breast cancer Mother    Arrhythmia Father    Heart failure Father    Breast cancer Sister      ROS:  Please see the history of present illness.   All other ROS reviewed and negative.     Physical Exam/Data:   Vitals:   05/20/23 0437 05/20/23 1231 05/20/23 2138 05/21/23 0540  BP: 139/84 130/84 (!) 142/86 127/87  Pulse: 66 67 64 (!) 58  Resp:  18 14 20   Temp: 98.3 F (36.8 C) 97.9 F (36.6 C) 97.8 F (36.6 C) 97.8 F (36.6 C)  TempSrc: Oral Oral Oral Oral  SpO2: 97% 93% 94% 97%  Weight: 113 kg   111 kg  Height:        Intake/Output Summary (Last 24 hours) at 05/21/2023 1326 Last data filed at 05/21/2023 1200 Gross per 24 hour  Intake 583 ml  Output 2300 ml  Net -1717 ml      05/21/2023    5:40  AM 05/20/2023    4:37 AM 05/19/2023    5:28 AM  Last 3 Weights  Weight (lbs) 244 lb 11.4 oz 249 lb 1.9 oz 252 lb 10.4 oz  Weight (kg) 111 kg 113 kg 114.6 kg     Body mass index is 36.14 kg/m.  General:  Well nourished, well developed, in no acute distress HEENT: normal Neck: no JVD Vascular: No carotid bruits; Distal pulses 2+ bilaterally Cardiac:  normal S1, S2; RRR; no murmur  Lungs:  clear to auscultation bilaterally, no wheezing, rhonchi or rales  Abd: soft, nontender, no hepatomegaly  Ext: no edema Musculoskeletal:  No deformities, BUE and BLE strength normal and equal Skin: warm and dry  Neuro:  CNs 2-12 intact, no focal abnormalities noted Psych:  Normal affect   EKG:  The EKG was personally reviewed and demonstrates:   Telemetry:  Telemetry was personally reviewed and demonstrates:  showed sinus rhythm with 1st degree AV block. No ischemic changes; precordial leads have low voltage.  Relevant CV Studies: TTE 05/17/2023 1. Left ventricular ejection fraction, by estimation, is 60 to 65%. The  left ventricle has normal function. The left ventricle has no regional  wall motion abnormalities. There is mild concentric left ventricular  hypertrophy. Left ventricular diastolic  parameters are indeterminate.   2. Right ventricular systolic function is normal. The right ventricular size is normal. Tricuspid regurgitation signal is inadequate for assessing  PA pressure.   3. Left atrial size was severely dilated.   4. The mitral valve is normal in structure. No evidence of mitral valve  regurgitation. No evidence of mitral stenosis.   5. The aortic valve is normal in structure. Aortic valve regurgitation is  not visualized. No aortic stenosis is present.  6. The inferior vena cava is normal in size with greater than 50%  respiratory variability, suggesting right atrial pressure of 3 mmHg.   Laboratory Data:  High Sensitivity Troponin:   Recent Labs  Lab 05/16/23 1406  05/16/23 1655  TROPONINIHS 17 20*     Chemistry Recent Labs  Lab 05/18/23 0429 05/19/23 0416 05/20/23 0414 05/21/23 0916  NA 138 136 135 136  K 3.1* 3.3* 4.0 3.6  CL 99 97* 99 99  CO2 28 29 29 24   GLUCOSE 114* 111* 141* 111*  BUN 26* 31* 30* 34*  CREATININE 0.75 0.92 0.72 0.85  CALCIUM 8.0* 8.0* 8.2* 8.3*  MG 2.2 2.3 2.5*  --   GFRNONAA >60 >60 >60 >60  ANIONGAP 11 10 7 13     No results for input(s): "PROT", "ALBUMIN", "AST", "ALT", "ALKPHOS", "BILITOT" in the last 168 hours. Lipids No results for input(s): "CHOL", "TRIG", "HDL", "LABVLDL", "LDLCALC", "CHOLHDL" in the last 168 hours.  Hematology Recent Labs  Lab 05/18/23 0429 05/19/23 0416 05/20/23 0414  WBC 3.2* 3.8* 4.0  RBC 3.75* 3.78* 3.89*  HGB 12.9* 12.7* 13.6  HCT 40.0 39.8 40.7  MCV 106.7* 105.3* 104.6*  MCH 34.4* 33.6 35.0*  MCHC 32.3 31.9 33.4  RDW 14.5 14.3 13.9  PLT 120* 122* 154   Thyroid No results for input(s): "TSH", "FREET4" in the last 168 hours.  BNP Recent Labs  Lab 05/16/23 1406  BNP 353.2*    DDimer No results for input(s): "DDIMER" in the last 168 hours.   Radiology/Studies:  VAS [place] LOWER EXTREMITY VENOUS (DVT)  Result Date: 05/20/2023  Lower Venous DVT Study Patient Name:  Jesse Mcclure.  Date of Exam:   05/19/2023 Medical Rec #: [id number]            Accession #:    [id number] Date of Birth: Oct 15, 1950            Patient Gender: M Patient Age:   63 years Exam Location:  [facility] Procedure:      VAS [place] LOWER EXTREMITY VENOUS (DVT) Referring Phys: PRANAV PATEL --------------------------------------------------------------------------------  Indications: Edema.  Risk Factors: None identified. Comparison Study: No prior studies. Performing Technologist: Chanda Busing RVT  Examination Guidelines: A complete evaluation includes B-mode imaging, spectral Doppler, color Doppler, and power Doppler as needed of all accessible portions of each vessel. Bilateral testing is considered  an integral part of a complete examination. Limited examinations for reoccurring indications may be performed as noted. The reflux portion of the exam is performed with the patient in reverse Trendelenburg.  +---------+---------------+---------+-----------+----------+--------------+ RIGHT    CompressibilityPhasicitySpontaneityPropertiesThrombus Aging +---------+---------------+---------+-----------+----------+--------------+ CFV      Full           Yes      Yes                                 +---------+---------------+---------+-----------+----------+--------------+ SFJ      Full                                                        +---------+---------------+---------+-----------+----------+--------------+ FV Prox  Full                                                        +---------+---------------+---------+-----------+----------+--------------+  FV Mid   Full                                                        +---------+---------------+---------+-----------+----------+--------------+ FV DistalFull                                                        +---------+---------------+---------+-----------+----------+--------------+ PFV      Full                                                        +---------+---------------+---------+-----------+----------+--------------+ POP      Full           Yes      Yes                                 +---------+---------------+---------+-----------+----------+--------------+ PTV      Full                                                        +---------+---------------+---------+-----------+----------+--------------+ PERO     Full                                                        +---------+---------------+---------+-----------+----------+--------------+   +---------+---------------+---------+-----------+----------+--------------+ LEFT      CompressibilityPhasicitySpontaneityPropertiesThrombus Aging +---------+---------------+---------+-----------+----------+--------------+ CFV      Full           Yes      Yes                                 +---------+---------------+---------+-----------+----------+--------------+ SFJ      Full                                                        +---------+---------------+---------+-----------+----------+--------------+ FV Prox  Full                                                        +---------+---------------+---------+-----------+----------+--------------+ FV Mid   Full                                                        +---------+---------------+---------+-----------+----------+--------------+  FV DistalFull                                                        +---------+---------------+---------+-----------+----------+--------------+ PFV      Full                                                        +---------+---------------+---------+-----------+----------+--------------+ POP      Full           Yes      Yes                                 +---------+---------------+---------+-----------+----------+--------------+ PTV      Full                                                        +---------+---------------+---------+-----------+----------+--------------+ PERO     Full                                                        +---------+---------------+---------+-----------+----------+--------------+     Summary: RIGHT: - There is no evidence of deep vein thrombosis in the lower extremity.  - No cystic structure found in the popliteal fossa.  LEFT: - There is no evidence of deep vein thrombosis in the lower extremity.  - No cystic structure found in the popliteal fossa.  *See table(s) above for measurements and observations. Electronically signed by Sherald Hess MD on 05/20/2023 at 9:45:20 AM.    Final    DG Knee Complete 4  Views Right  Result Date: 05/18/2023 CLINICAL DATA:  Knee swelling EXAM: RIGHT KNEE - COMPLETE 4 VIEW COMPARISON:  None Available. FINDINGS: Osteopenia. Small osteophytes of the patellofemoral joint and lateral compartment. There is joint space loss of the patellofemoral joint. There is a well corticated density above the patellofemoral joint and slightly lateral. Please correlate for any previous remote injury or other soft tissue ossification. Otherwise no acute fracture or dislocation. Small joint effusion. IMPRESSION: Degenerative changes.  Greatest of the patellofemoral joint. Small joint effusion. Well corticated ossific density seen above the patella on lateral view. This could be the sequela of old trauma. Please correlate for any prior imaging. If there is further concern additional evaluation such as CT as clinically appropriate or MRI. Electronically Signed   By: Karen Kays M.D.   On: 05/18/2023 18:57   ECHOCARDIOGRAM COMPLETE  Result Date: 05/17/2023    ECHOCARDIOGRAM REPORT   Patient Name:   Jesse Mcclure. Date of Exam: 05/17/2023 Medical Rec #:  [id number]           Height:       69.0 in Accession #:    [id number]          Weight:  263.9 lb Date of Birth:  28-Feb-1951           BSA:          2.324 m Patient Age:    71 years            BP:           131/86 mmHg Patient Gender: M                   HR:           61 bpm. Exam Location:  Inpatient Procedure: 2D Echo, Cardiac Doppler, Color Doppler and Intracardiac            Opacification Agent Indications:    CHF- Acute Diastolic I50.31  History:        Patient has prior history of Echocardiogram examinations, most                 recent 09/17/2021. Risk Factors:Hypertension and Dyslipidemia.  Sonographer:    Harriette Bouillon RDCS Referring Phys: [id number] EMILY B MULLEN IMPRESSIONS  1. Left ventricular ejection fraction, by estimation, is 60 to 65%. The left ventricle has normal function. The left ventricle has no regional wall motion  abnormalities. There is mild concentric left ventricular hypertrophy. Left ventricular diastolic parameters are indeterminate.  2. Right ventricular systolic function is normal. The right ventricular size is normal. Tricuspid regurgitation signal is inadequate for assessing PA pressure.  3. Left atrial size was severely dilated.  4. The mitral valve is normal in structure. No evidence of mitral valve regurgitation. No evidence of mitral stenosis.  5. The aortic valve is normal in structure. Aortic valve regurgitation is not visualized. No aortic stenosis is present.  6. The inferior vena cava is normal in size with greater than 50% respiratory variability, suggesting right atrial pressure of 3 mmHg. FINDINGS  Left Ventricle: Left ventricular ejection fraction, by estimation, is 60 to 65%. The left ventricle has normal function. The left ventricle has no regional wall motion abnormalities. Definity contrast agent was given IV to delineate the left ventricular  endocardial borders. The left ventricular internal cavity size was normal in size. There is mild concentric left ventricular hypertrophy. Left ventricular diastolic parameters are indeterminate. Right Ventricle: The right ventricular size is normal. No increase in right ventricular wall thickness. Right ventricular systolic function is normal. Tricuspid regurgitation signal is inadequate for assessing PA pressure. Left Atrium: Left atrial size was severely dilated. Right Atrium: Right atrial size was normal in size. Pericardium: There is no evidence of pericardial effusion. Mitral Valve: The mitral valve is normal in structure. No evidence of mitral valve regurgitation. No evidence of mitral valve stenosis. Tricuspid Valve: The tricuspid valve is normal in structure. Tricuspid valve regurgitation is not demonstrated. No evidence of tricuspid stenosis. Aortic Valve: The aortic valve is normal in structure. Aortic valve regurgitation is not visualized. No aortic  stenosis is present. Pulmonic Valve: The pulmonic valve was normal in structure. Pulmonic valve regurgitation is not visualized. No evidence of pulmonic stenosis. Aorta: The aortic root is normal in size and structure. Venous: The inferior vena cava is normal in size with greater than 50% respiratory variability, suggesting right atrial pressure of 3 mmHg. IAS/Shunts: No atrial level shunt detected by color flow Doppler.  LEFT VENTRICLE PLAX 2D LVIDd:         4.80 cm   Diastology LVIDs:         3.30 cm   LV e' medial:  6.74 cm/s LV PW:         1.20 cm   LV E/e' medial:  14.8 LV IVS:        1.20 cm   LV e' lateral:   6.74 cm/s LVOT diam:     2.40 cm   LV E/e' lateral: 14.8 LV SV:         91 LV SV Index:   39 LVOT Area:     4.52 cm  IVC IVC diam: 2.30 cm LEFT ATRIUM            Index LA diam:      4.60 cm  1.98 cm/m LA Vol (A2C): 47.8 ml  20.57 ml/m LA Vol (A4C): 163.0 ml 70.13 ml/m  AORTIC VALVE LVOT Vmax:   97.00 cm/s LVOT Vmean:  72.700 cm/s LVOT VTI:    0.202 m  AORTA Ao Root diam: 3.20 cm Ao Asc diam:  2.60 cm MITRAL VALVE MV Area (PHT): 3.60 cm     SHUNTS MV Decel Time: 211 msec     Systemic VTI:  0.20 m MV E velocity: 100.00 cm/s  Systemic Diam: 2.40 cm MV A velocity: 94.90 cm/s MV E/A ratio:  1.05 Kardie Tobb DO Electronically signed by Thomasene Ripple DO Signature Date/Time: 05/17/2023/2:31:18 PM    Final      Assessment and Plan:   HFpEF New Onset Risk factor includes obesity - diuresing well on lasix 40 mg BID - weight 263-> 244 (weight in April was 251) - appears close to euvolemia, no pulmonary htn, IVC was normal - can transition to oral Sat or Sunday  Hx DVT/PE - on xarelto  HLD - continue lipitor 20 mg daily  Cardio-Onc -Pomalyst and Ninlaro along with dexamethasone for recurrent multiple myeloma  - pomalidomide has >10% association with Dm2/hyperglycemia. No risk with heart failure.  -ixazomib is a proteasome inhibitor; this can be associated with HF. He has other risk factors  for CHF, don't think he needs to stop therapy Risk Assessment/Risk Scores:       For questions or updates, please contact [facility] Please consult [email or web address] for contact info under    Signed, Maisie Fus, MD  05/21/2023 1:26 PM

## 2023-05-21 NOTE — Care Management Important Message (Signed)
Important Message  Patient Details IM Letter given Name: Jesse Mcclure. MRN: [id number] Date of Birth: 10-Dec-1950   Medicare Important Message Given:  Yes     [name] 05/21/2023, 2:22 PM

## 2023-05-22 DIAGNOSIS — I509 Heart failure, unspecified: Secondary | ICD-10-CM | POA: Diagnosis not present

## 2023-05-22 DIAGNOSIS — I5021 Acute systolic (congestive) heart failure: Secondary | ICD-10-CM

## 2023-05-22 DIAGNOSIS — C9000 Multiple myeloma not having achieved remission: Secondary | ICD-10-CM | POA: Diagnosis not present

## 2023-05-22 DIAGNOSIS — R0902 Hypoxemia: Secondary | ICD-10-CM | POA: Diagnosis not present

## 2023-05-22 DIAGNOSIS — R0602 Shortness of breath: Secondary | ICD-10-CM | POA: Diagnosis not present

## 2023-05-22 LAB — BASIC METABOLIC PANEL
Anion gap: 10 (ref 5–15)
BUN: 41 mg/dL — ABNORMAL HIGH (ref 8–23)
CO2: 28 mmol/L (ref 22–32)
Calcium: 8.1 mg/dL — ABNORMAL LOW (ref 8.9–10.3)
Chloride: 100 mmol/L (ref 98–111)
Creatinine, Ser: 0.92 mg/dL (ref 0.61–1.24)
GFR, Estimated: >60 mL/min (ref >60)
Glucose, Bld: 120 mg/dL — ABNORMAL HIGH (ref 70–99)
Potassium: 3.2 mmol/L — ABNORMAL LOW (ref 3.5–5.1)
Sodium: 138 mmol/L (ref 135–145)

## 2023-05-22 LAB — BODY FLUID CULTURE W GRAM STAIN: Culture: NO GROWTH

## 2023-05-22 MED ORDER — POTASSIUM CHLORIDE CRYS ER 20 MEQ PO TBCR
40.0000 meq | EXTENDED_RELEASE_TABLET | Freq: Once | ORAL | Status: AC
  Administered 2023-05-22: 40 meq via ORAL
  Filled 2023-05-22: qty 2

## 2023-05-22 MED ORDER — FUROSEMIDE 10 MG/ML IJ SOLN
INTRAMUSCULAR | Status: AC
  Filled 2023-05-22: qty 4

## 2023-05-22 MED ORDER — FUROSEMIDE 10 MG/ML IJ SOLN: 40.0000 mg | Freq: Once | INTRAMUSCULAR | Status: AC

## 2023-05-22 MED ORDER — EMPAGLIFLOZIN 10 MG PO TABS
10.0000 mg | ORAL_TABLET | Freq: Every day | ORAL | Status: DC
  Administered 2023-05-22 – 2023-05-23 (×2): 10 mg via ORAL
  Filled 2023-05-22 (×2): qty 1

## 2023-05-22 NOTE — Progress Notes (Signed)
Rounding Note    Patient Name: Jesse Mcclure. Date of Encounter: 05/22/2023  [facility] Cardiologist: New, would like to establish with Dr Eden Emms  Subjective   No complaints  Inpatient Medications    Scheduled Meds:  acyclovir  400 mg Oral Daily   atorvastatin  20 mg Oral Daily   diclofenac Sodium  2 g Topical QID   folic acid  0.5 mg Oral Daily   furosemide  40 mg Intravenous BID   predniSONE  50 mg Oral Q breakfast   rivaroxaban  10 mg Oral Q supper   sodium chloride flush  3 mL Intravenous Q12H   Continuous Infusions:  PRN Meds: acetaminophen, albuterol, mouth rinse, oxyCODONE   Vital Signs    Vitals:   05/21/23 1332 05/21/23 2104 05/22/23 0512 05/22/23 0514  BP: 133/83 138/79 136/88   Pulse: 68 (!) 58 (!) 53   Resp:   16   Temp: 97.9 F (36.6 C) 98.1 F (36.7 C) 97.7 F (36.5 C)   TempSrc: Oral Oral    SpO2: 94% 95% 97%   Weight:    111.6 kg  Height:        Intake/Output Summary (Last 24 hours) at 05/22/2023 1003 Last data filed at 05/22/2023 0920 Gross per 24 hour  Intake 940 ml  Output 1850 ml  Net -910 ml      05/22/2023    5:14 AM 05/21/2023    5:40 AM 05/20/2023    4:37 AM  Last 3 Weights  Weight (lbs) 246 lb 0.5 oz 244 lb 11.4 oz 249 lb 1.9 oz  Weight (kg) 111.6 kg 111 kg 113 kg      Telemetry    SR - Personally Reviewed  ECG    N/a - Personally Reviewed  Physical Exam   GEN: No acute distress.   Neck: No JVD Cardiac: RRR, no murmurs, rubs, or gallops.  Respiratory: Clear to auscultation bilaterally. GI: Soft, nontender, non-distended  MS: trace bilateral edema. Neuro:  Nonfocal  Psych: Normal affect   Labs    High Sensitivity Troponin:   Recent Labs  Lab 05/16/23 1406 05/16/23 1655  TROPONINIHS 17 20*     Chemistry Recent Labs  Lab 05/18/23 0429 05/19/23 0416 05/20/23 0414 05/21/23 0916 05/22/23 0420  NA 138 136 135 136 138  K 3.1* 3.3* 4.0 3.6 3.2*  CL 99 97* 99 99 100  CO2 28 29 29 24 28    GLUCOSE 114* 111* 141* 111* 120*  BUN 26* 31* 30* 34* 41*  CREATININE 0.75 0.92 0.72 0.85 0.92  CALCIUM 8.0* 8.0* 8.2* 8.3* 8.1*  MG 2.2 2.3 2.5*  --   --   GFRNONAA >60 >60 >60 >60 >60  ANIONGAP 11 10 7 13 10     Lipids No results for input(s): "CHOL", "TRIG", "HDL", "LABVLDL", "LDLCALC", "CHOLHDL" in the last 168 hours.  Hematology Recent Labs  Lab 05/18/23 0429 05/19/23 0416 05/20/23 0414  WBC 3.2* 3.8* 4.0  RBC 3.75* 3.78* 3.89*  HGB 12.9* 12.7* 13.6  HCT 40.0 39.8 40.7  MCV 106.7* 105.3* 104.6*  MCH 34.4* 33.6 35.0*  MCHC 32.3 31.9 33.4  RDW 14.5 14.3 13.9  PLT 120* 122* 154   Thyroid No results for input(s): "TSH", "FREET4" in the last 168 hours.  BNP Recent Labs  Lab 05/16/23 1406  BNP 353.2*    DDimer No results for input(s): "DDIMER" in the last 168 hours.   Radiology    No results found.  Cardiac Studies     Patient Profile     Kentavius Hoefling. is a 72 y.o. adult with a hx of HTN, BPH, HLD, multiple myeloma, hx of polio,  who is being seen 05/21/2023 for the evaluation of C/f CHF exacerbation at the request of Dr. Isidoro Donning.   Assessment & Plan    1.Acute HFpEF - 05/2023 echo: LVEF 60-65%, indet diastolic function, normal RV, severe LAE would suggest long standing significant elevated LA pressures - CXR +CHF, CT small bilateral effusions, BNP 353  - negative 1.1 L yesterday and 8.8 L since admission. By weights 263-->246 He is on IV lasix 40mg  bid, slight uptrned in Cr/BUN but still WNL. Will d/c IV lasix after tonights dose and anticipate transitioning to oral lasix tomorrow.  -start jardiance 10mg  daily in setting of HFpEF   Would anticipate discharge tomorrow with lasix 40mg  bid, jardiance 10mg  daily.   For questions or updates, please contact [facility] Please consult [email or web address] for contact info under        Signed, Dina Rich, MD  05/22/2023, 10:03 AM

## 2023-05-22 NOTE — Plan of Care (Signed)
  Problem: Clinical Measurements: Goal: Respiratory complications will improve Outcome: Progressing   

## 2023-05-22 NOTE — Progress Notes (Signed)
[facility]                                                                              Jesse Mcclure, is a 72 y.o. adult, DOB - 03/07/1951, ZOX:[id number] Admit date - 05/16/2023    Outpatient Primary MD for the patient is Ronnald Nian, MD  LOS - 5  days  Chief Complaint  Patient presents with   Shortness of Breath       Brief summary   PMH of HTN, BPH, HLD, multiple myeloma present to the hospital with complaints of progressively worsening swelling of his legs and shortness of breath for last 2 weeks. Appears to have acute diastolic CHF.   Assessment & Plan    Principal Problem: Acute diastolic CHF. - significant volume overload, leg swelling, shortness of breath on rest and exertion on presentation. -2D echo showed EF 60-65%.  Normal RV, no significant valvular abnormality, no pericardial effusion.  -Per oncology, do not believe his oral chemotherapy is the cause of CHF.  However treatment on hold while hospitalized -Bilateral lower extremity Doppler negative for DVT. --Strict I's and O's and daily weights, negative balance of 8.3 L, weight down from 264.5 lbs on admission to 246 today -Continue IV Lasix 40 mg every 12 hours today, transition to p.o. tomorrow    Active problems Multiple myeloma. Chronic thrombocytopenia, Chronic anemia. -Oral chemotherapy on hold, appreciate oncology follow-up by Dr. Bertis Ruddy - CT scan shows evidence of new metastatic lesions, has appointment outpatient with oncology.   Acute gout, right knee swelling - X-ray shows small effusion. ESR and CRP elevated.  Uric acid normal. - Orthopedics consulted, aspiration shows intracellular monosodium urate crystals. - Avoiding colchicine due to risk for pancytopenia in patient with multiple myeloma. -Continue prednisone 50 mg daily, day #3 today, will taper tomorrow upon discharge -Continue Toradol as needed  Hypokalemia -Replaced   HTN BP stable -hold losartan and HCTZ  while doing IV diuresis. -Will continue losartan and DC HCTZ at discharge as likely patient will be on oral Lasix   HLD. Continues statin   History of PE. Continue Xarelto   Essential tremor. On primidone.  Patient had stopped taking this medication as he is concerned that this is related to his shortness of breath. Will recommend to reattempt in the future.   Obesity Estimated body mass index is 36.33 kg/m as calculated from the following:   Height as of this encounter: 5\' 9"  (1.753 m).   Weight as of this encounter: 111.6 kg.  Code Status: Full CODE STATUS DVT Prophylaxis:  rivaroxaban (XARELTO) tablet 10 mg Start: 05/17/23 1700 rivaroxaban (XARELTO) tablet 10 mg   Level of Care: Level of care: Telemetry Family Communication: Updated patient Disposition Plan:      Remains inpatient appropriate: Hopeful DC home tomorrow  Procedures:    Consultants:   Cardiology  Antimicrobials:   Anti-infectives (From admission, onward)    Start     Dose/Rate Route Frequency Ordered Stop   05/17/23 1000  acyclovir (ZOVIRAX) tablet 400 mg        400 mg Oral Daily 05/16/23 2002  Medications  acyclovir  400 mg Oral Daily   atorvastatin  20 mg Oral Daily   diclofenac Sodium  2 g Topical QID   empagliflozin  10 mg Oral Daily   folic acid  0.5 mg Oral Daily   furosemide  40 mg Intravenous BID   potassium chloride  40 mEq Oral Once   predniSONE  50 mg Oral Q breakfast   rivaroxaban  10 mg Oral Q supper   sodium chloride flush  3 mL Intravenous Q12H      Subjective:   Jesse Mcclure was seen and examined today.  Lower extremity swelling improving, no acute chest pain, shortness of breath, fever chills nausea vomiting.  Feels better today.  Objective:   Vitals:   05/21/23 2104 05/22/23 0512 05/22/23 0514 05/22/23 1250  BP: 138/79 136/88  (!) 140/82  Pulse: (!) 58 (!) 53  63  Resp:  16  17  Temp: 98.1 F (36.7 C) 97.7 F (36.5 C)  98.7 F (37.1 C)  TempSrc:  Oral   Oral  SpO2: 95% 97%  92%  Weight:   111.6 kg   Height:        Intake/Output Summary (Last 24 hours) at 05/22/2023 1331 Last data filed at 05/22/2023 1300 Gross per 24 hour  Intake 1840 ml  Output 2550 ml  Net -710 ml     Wt Readings from Last 3 Encounters:  05/22/23 111.6 kg  05/12/23 118.1 kg  05/11/23 118.5 kg    Physical Exam General: Alert and oriented x 3, NAD Cardiovascular: S1 S2 clear, RRR.  Respiratory: CTAB, no wheezing Gastrointestinal: Soft, nontender, nondistended, NBS Ext: 1+ pedal edema bilaterally, improving Neuro: no new deficits Psych: Normal affect     Data Reviewed:  I have personally reviewed following labs    CBC Lab Results  Component Value Date   WBC 4.0 05/20/2023   RBC 3.89 (L) 05/20/2023   HGB 13.6 05/20/2023   HCT 40.7 05/20/2023   MCV 104.6 (H) 05/20/2023   MCH 35.0 (H) 05/20/2023   PLT 154 05/20/2023   MCHC 33.4 05/20/2023   RDW 13.9 05/20/2023   LYMPHSABS 1.4 05/16/2023   MONOABS 1.2 (H) 05/16/2023   EOSABS 0.2 05/16/2023   BASOSABS 0.0 05/16/2023     Last metabolic panel Lab Results  Component Value Date   NA 138 05/22/2023   K 3.2 (L) 05/22/2023   CL 100 05/22/2023   CO2 28 05/22/2023   BUN 41 (H) 05/22/2023   CREATININE 0.92 05/22/2023   GLUCOSE 120 (H) 05/22/2023   GFRNONAA >60 05/22/2023   GFRAA >60 06/14/2020   CALCIUM 8.1 (L) 05/22/2023   PHOS 2.5 09/18/2021   PROT 6.1 (L) 04/23/2023   ALBUMIN 3.6 04/23/2023   LABGLOB 2.4 04/23/2023   AGRATIO 1.9 10/11/2018   BILITOT 0.8 04/23/2023   ALKPHOS 51 04/23/2023   AST 23 04/23/2023   ALT 29 04/23/2023   ANIONGAP 10 05/22/2023    CBG (last 3)  No results for input(s): "GLUCAP" in the last 72 hours.    Coagulation Profile: No results for input(s): "INR", "PROTIME" in the last 168 hours.   Radiology Studies: I have personally reviewed the imaging studies  No results found.     Thad Ranger M.D. [facility] 05/22/2023, 1:31  PM  Available via Epic secure chat 7am-7pm After 7 pm, please refer to night coverage provider listed on amion.

## 2023-05-23 DIAGNOSIS — I5031 Acute diastolic (congestive) heart failure: Secondary | ICD-10-CM

## 2023-05-23 DIAGNOSIS — R0902 Hypoxemia: Secondary | ICD-10-CM | POA: Diagnosis not present

## 2023-05-23 DIAGNOSIS — R0602 Shortness of breath: Secondary | ICD-10-CM | POA: Diagnosis not present

## 2023-05-23 DIAGNOSIS — I509 Heart failure, unspecified: Secondary | ICD-10-CM | POA: Diagnosis not present

## 2023-05-23 DIAGNOSIS — C9000 Multiple myeloma not having achieved remission: Secondary | ICD-10-CM | POA: Diagnosis not present

## 2023-05-23 LAB — BASIC METABOLIC PANEL
Anion gap: 8 (ref 5–15)
BUN: 46 mg/dL — ABNORMAL HIGH (ref 8–23)
CO2: 30 mmol/L (ref 22–32)
Calcium: 8.2 mg/dL — ABNORMAL LOW (ref 8.9–10.3)
Chloride: 101 mmol/L (ref 98–111)
Creatinine, Ser: 1.1 mg/dL (ref 0.61–1.24)
GFR, Estimated: >60 mL/min (ref >60)
Glucose, Bld: 102 mg/dL — ABNORMAL HIGH (ref 70–99)
Potassium: 4.2 mmol/L (ref 3.5–5.1)
Sodium: 139 mmol/L (ref 135–145)

## 2023-05-23 LAB — MAGNESIUM: Magnesium: 2.6 mg/dL — ABNORMAL HIGH (ref 1.7–2.4)

## 2023-05-23 MED ORDER — LOSARTAN POTASSIUM 100 MG PO TABS
100.0000 mg | ORAL_TABLET | Freq: Every day | ORAL | 5 refills | Status: DC
  Filled 2023-12-20: qty 30, 30d supply, fill #0
  Filled 2024-01-20: qty 30, 30d supply, fill #1
  Filled 2024-03-06: qty 30, 30d supply, fill #2
  Filled 2024-04-04: qty 30, 30d supply, fill #3

## 2023-05-23 MED ORDER — FUROSEMIDE 40 MG PO TABS: 40.0000 mg | ORAL_TABLET | Freq: Two times a day (BID) | ORAL | 3 refills | Status: DC

## 2023-05-23 MED ORDER — PREDNISONE 10 MG PO TABS: ORAL_TABLET | ORAL | 0 refills | Status: DC

## 2023-05-23 MED ORDER — FUROSEMIDE 40 MG PO TABS: 40.0000 mg | ORAL_TABLET | Freq: Two times a day (BID) | ORAL | Status: DC

## 2023-05-23 MED ORDER — DICLOFENAC SODIUM 1 % EX GEL
2.0000 g | Freq: Four times a day (QID) | CUTANEOUS | 2 refills | Status: DC
  Filled 2023-11-24: qty 100, 13d supply, fill #0

## 2023-05-23 MED ORDER — EMPAGLIFLOZIN 10 MG PO TABS: 10.0000 mg | ORAL_TABLET | Freq: Every day | ORAL | 3 refills | Status: DC

## 2023-05-23 NOTE — Discharge Summary (Signed)
Physician Discharge Summary   Patient: Jesse Mcclure. MRN: [id number] DOB: 1951/05/26  Admit date:     05/16/2023  Discharge date: 05/23/23  Discharge Physician: Thad Ranger, MD    PCP: Ronnald Nian, MD   Recommendations at discharge:   Lasix 40 mg p.o. twice daily Outpatient follow-up scheduled with cardiology Prednisone with taper 40 mg x 2 days, 30 mg x 2 days, 20 mg x 2 days, 10 mg x 2 days then off for acute gout and right knee swelling losartan/HCTZ combination pill discontinued and placed on losartan 100 mg daily Placed on Jardiance 10 mg daily  Discharge Diagnoses:  Acute diastolic (congestive) heart failure (HCC) Acute gout, right knee swelling   Essential hypertension   Hyperlipidemia   Chronic musculoskeletal pain   Multiple myeloma without remission (HCC)   History of pulmonary embolus (PE)   Essential tremor    Hospital Course:   Patient is a 72 year old male with HTN, BPH, HLD, multiple myeloma present to the hospital with complaints of progressively worsening swelling of his legs and shortness of breath for last 2 weeks. Appears to have acute diastolic CHF.  Assessment and Plan:   Acute diastolic CHF. - significant volume overload, leg swelling, shortness of breath on rest and exertion on presentation. -2D echo showed EF 60-65%.  Normal RV, no significant valvular abnormality, no pericardial effusion.  -Per oncology, do not believe his oral chemotherapy is the cause of CHF.  However treatment on hold while hospitalized -Bilateral lower extremity Doppler negative for DVT. -Patient was placed on aggressive diuresis with IV Lasix -Did well with diuresis, negative balance of 9.6 L at discharge.  Weight 264.5 on admission and 240.5 on discharge.  -Patient was followed by cardiology and recommended oral Lasix 40 mg p.o. twice daily and outpatient follow-up -Placed on Jardiance 10 mg daily    Multiple myeloma. Chronic thrombocytopenia, Chronic  anemia. -Oral chemotherapy on hold, appreciate oncology follow-up by Dr. Bertis Ruddy - CT scan shows evidence of new metastatic lesions, has appointment outpatient with oncology.   Acute gout, right knee swelling - X-ray shows small effusion. ESR and CRP elevated.  Uric acid normal. - Orthopedics consulted, aspiration shows intracellular monosodium urate crystals. - Avoiding colchicine due to risk for pancytopenia in patient with multiple myeloma. -Patient was placed on prednisone 50 mg daily while inpatient, continue taper outpatient as per instructions above.   Hypokalemia -Replaced   HTN BP stable, patient was on losartan and HCTZ prior to admission -Continue losartan, 100 mg daily -As patient is on Lasix, does not need HCTZ  HLD. Continues statin   History of PE. Continue Xarelto   Essential tremor. On primidone.  Patient had stopped taking this medication as he is concerned that this may be related to his shortness of breath.   Obesity Estimated body mass index is 36.33 kg/m as calculated from the following:   Height as of this encounter: 5\' 9"  (1.753 m).   Weight as of this encounter: 111.6 kg.     Pain control - [employer] Controlled Substance Reporting System database was reviewed. and patient was instructed, not to drive, operate heavy machinery, perform activities at heights, swimming or participation in water activities or provide baby-sitting services while on Pain, Sleep and Anxiety Medications; until their outpatient Physician has advised to do so again. Also recommended to not to take more than prescribed Pain, Sleep and Anxiety Medications.  Consultants: Cardiology Procedures performed: 2D echo Disposition: Home Diet recommendation:  Discharge Diet  Orders (From admission, onward)     Start     Ordered   05/23/23 0000  Diet - low sodium heart healthy        05/23/23 0912            DISCHARGE MEDICATION: Allergies as of 05/23/2023   No Known  Allergies      Medication List     STOP taking these medications    losartan-hydrochlorothiazide 100-12.5 MG tablet Commonly known as: HYZAAR       TAKE these medications    acyclovir 400 MG tablet Commonly known as: ZOVIRAX Take 1 tablet (400 mg total) by mouth daily.   atorvastatin 20 MG tablet Commonly known as: LIPITOR Take 1 tablet (20 mg total) by mouth daily. TAKE 1 TABLET(20 MG) BY MOUTH DAILY   calcium carbonate 500 MG chewable tablet Commonly known as: TUMS - dosed in mg elemental calcium Chew 1 tablet by mouth 2 (two) times daily.   cyanocobalamin 1000 MCG tablet Commonly known as: VITAMIN B12 Take 1,000 mcg by mouth daily.   diclofenac Sodium 1 % Gel Commonly known as: VOLTAREN Apply 2 g topically 4 (four) times daily. Apply to knee   empagliflozin 10 MG Tabs tablet Commonly known as: JARDIANCE Take 1 tablet (10 mg total) by mouth daily.   folic acid 400 MCG tablet Commonly known as: FOLVITE Take 400 mcg by mouth daily.   furosemide 40 MG tablet Commonly known as: LASIX Take 1 tablet (40 mg total) by mouth 2 (two) times daily.   ixazomib citrate 3 MG capsule Commonly known as: Ninlaro Take 1 capsule (3 mg) by mouth weekly, 3 weeks on, 1 week off, repeat every 4 weeks. Take on an empty stomach 1hr before or 2hr after meals.   losartan 100 MG tablet Commonly known as: Cozaar Take 1 tablet (100 mg total) by mouth daily.   Multi-Vitamin tablet Take 1 tablet by mouth daily.   pomalidomide 3 MG capsule Commonly known as: POMALYST Take 1 capsule (3mg ) by mouth daily for 21 days, rest 7 days, repeat every 28 days.   predniSONE 10 MG tablet Commonly known as: DELTASONE Prednisone dosing: Take  Prednisone 40mg  (4 tabs) x 2 days, then taper to 30mg  (3 tabs) x 2 days, then 20mg  (2 tabs) x 2 days, then 10mg  (1 tab) x 2days, then OFF.   primidone 50 MG tablet Commonly known as: MYSOLINE Take 1 tablet (50 mg total) by mouth at bedtime.    prochlorperazine 10 MG tablet Commonly known as: COMPAZINE TAKE 1 TABLET(10 MG) BY MOUTH EVERY 6 HOURS AS NEEDED FOR NAUSEA OR VOMITING   rivaroxaban 10 MG Tabs tablet Commonly known as: XARELTO Take 1 tablet (10 mg total) by mouth daily with supper.   SYSTANE OP Place 1 drop into both eyes daily as needed (dry eyes).   Vitamin D 50 MCG (2000 UT) tablet Take 2,000 Units by mouth daily.        Follow-up Information     Tereso Newcomer T, PA-C Follow up on 06/08/2023.   Specialties: Cardiology, Physician Assistant Why: at 10:55 AM, for hospital follow-up Contact information: [address] [place] [address] [phone]         Ronnald Nian, MD. Schedule an appointment as soon as possible for a visit in 2 week(s).   Specialty: Family Medicine Why: for hospital follow-up Contact information: [address] [place] [address] [phone]  Discharge Exam: Filed Weights   05/21/23 0540 05/22/23 0514 05/23/23 0543  Weight: 111 kg 111.6 kg 109.1 kg   S: Ambulating in the room, looking forward to go home today.  Cleared by cardiology.  BP (!) 146/98 (BP Location: Right Arm)   Pulse 60   Temp 97.7 F (36.5 C) (Oral)   Resp 17   Ht 5\' 9"  (1.753 m)   Wt 109.1 kg   SpO2 96%   BMI 35.52 kg/m   Physical Exam General: Alert and oriented x 3, NAD Cardiovascular: S1 S2 clear, RRR.  No JVD. Respiratory: CTAB, no wheezing, rales or rhonchi Gastrointestinal: Soft, nontender, nondistended, NBS Ext: trace pedal edema bilaterally Neuro: no new deficits Psych: Normal affect and demeanor, alert and oriented x3    Condition at discharge: fair  The results of significant diagnostics from this hospitalization (including imaging, microbiology, ancillary and laboratory) are listed below for reference.   Imaging Studies: VAS [place] LOWER EXTREMITY VENOUS (DVT)  Result Date: 05/20/2023  Lower Venous DVT Study Patient Name:   Philipe Rought.  Date of Exam:   05/19/2023 Medical Rec #: [id number]            Accession #:    [id number] Date of Birth: May 02, 1951            Patient Gender: M Patient Age:   82 years Exam Location:  [facility] Procedure:      VAS [place] LOWER EXTREMITY VENOUS (DVT) Referring Phys: PRANAV PATEL --------------------------------------------------------------------------------  Indications: Edema.  Risk Factors: None identified. Comparison Study: No prior studies. Performing Technologist: Chanda Busing RVT  Examination Guidelines: A complete evaluation includes B-mode imaging, spectral Doppler, color Doppler, and power Doppler as needed of all accessible portions of each vessel. Bilateral testing is considered an integral part of a complete examination. Limited examinations for reoccurring indications may be performed as noted. The reflux portion of the exam is performed with the patient in reverse Trendelenburg.  +---------+---------------+---------+-----------+----------+--------------+ RIGHT    CompressibilityPhasicitySpontaneityPropertiesThrombus Aging +---------+---------------+---------+-----------+----------+--------------+ CFV      Full           Yes      Yes                                 +---------+---------------+---------+-----------+----------+--------------+ SFJ      Full                                                        +---------+---------------+---------+-----------+----------+--------------+ FV Prox  Full                                                        +---------+---------------+---------+-----------+----------+--------------+ FV Mid   Full                                                        +---------+---------------+---------+-----------+----------+--------------+ FV DistalFull                                                        +---------+---------------+---------+-----------+----------+--------------+  PFV      Full                                                         +---------+---------------+---------+-----------+----------+--------------+ POP      Full           Yes      Yes                                 +---------+---------------+---------+-----------+----------+--------------+ PTV      Full                                                        +---------+---------------+---------+-----------+----------+--------------+ PERO     Full                                                        +---------+---------------+---------+-----------+----------+--------------+   +---------+---------------+---------+-----------+----------+--------------+ LEFT     CompressibilityPhasicitySpontaneityPropertiesThrombus Aging +---------+---------------+---------+-----------+----------+--------------+ CFV      Full           Yes      Yes                                 +---------+---------------+---------+-----------+----------+--------------+ SFJ      Full                                                        +---------+---------------+---------+-----------+----------+--------------+ FV Prox  Full                                                        +---------+---------------+---------+-----------+----------+--------------+ FV Mid   Full                                                        +---------+---------------+---------+-----------+----------+--------------+ FV DistalFull                                                        +---------+---------------+---------+-----------+----------+--------------+ PFV      Full                                                        +---------+---------------+---------+-----------+----------+--------------+  POP      Full           Yes      Yes                                 +---------+---------------+---------+-----------+----------+--------------+ PTV      Full                                                         +---------+---------------+---------+-----------+----------+--------------+ PERO     Full                                                        +---------+---------------+---------+-----------+----------+--------------+     Summary: RIGHT: - There is no evidence of deep vein thrombosis in the lower extremity.  - No cystic structure found in the popliteal fossa.  LEFT: - There is no evidence of deep vein thrombosis in the lower extremity.  - No cystic structure found in the popliteal fossa.  *See table(s) above for measurements and observations. Electronically signed by Sherald Hess MD on 05/20/2023 at 9:45:20 AM.    Final    DG Knee Complete 4 Views Right  Result Date: 05/18/2023 CLINICAL DATA:  Knee swelling EXAM: RIGHT KNEE - COMPLETE 4 VIEW COMPARISON:  None Available. FINDINGS: Osteopenia. Small osteophytes of the patellofemoral joint and lateral compartment. There is joint space loss of the patellofemoral joint. There is a well corticated density above the patellofemoral joint and slightly lateral. Please correlate for any previous remote injury or other soft tissue ossification. Otherwise no acute fracture or dislocation. Small joint effusion. IMPRESSION: Degenerative changes.  Greatest of the patellofemoral joint. Small joint effusion. Well corticated ossific density seen above the patella on lateral view. This could be the sequela of old trauma. Please correlate for any prior imaging. If there is further concern additional evaluation such as CT as clinically appropriate or MRI. Electronically Signed   By: Karen Kays M.D.   On: 05/18/2023 18:57   ECHOCARDIOGRAM COMPLETE  Result Date: 05/17/2023    ECHOCARDIOGRAM REPORT   Patient Name:   Ahmarion Gogol. Date of Exam: 05/17/2023 Medical Rec #:  [id number]           Height:       69.0 in Accession #:    [id number]          Weight:       263.9 lb Date of Birth:  [birth year]           BSA:          2.324 m Patient Age:    71 years             BP:           131/86 mmHg Patient Gender: M                   HR:           61 bpm. Exam Location:  Inpatient Procedure: 2D Echo, Cardiac Doppler, Color Doppler and Intracardiac  Opacification Agent Indications:    CHF- Acute Diastolic I50.31  History:        Patient has prior history of Echocardiogram examinations, most                 recent 09/17/2021. Risk Factors:Hypertension and Dyslipidemia.  Sonographer:    Harriette Bouillon RDCS Referring Phys: [id number] EMILY B MULLEN IMPRESSIONS  1. Left ventricular ejection fraction, by estimation, is 60 to 65%. The left ventricle has normal function. The left ventricle has no regional wall motion abnormalities. There is mild concentric left ventricular hypertrophy. Left ventricular diastolic parameters are indeterminate.  2. Right ventricular systolic function is normal. The right ventricular size is normal. Tricuspid regurgitation signal is inadequate for assessing PA pressure.  3. Left atrial size was severely dilated.  4. The mitral valve is normal in structure. No evidence of mitral valve regurgitation. No evidence of mitral stenosis.  5. The aortic valve is normal in structure. Aortic valve regurgitation is not visualized. No aortic stenosis is present.  6. The inferior vena cava is normal in size with greater than 50% respiratory variability, suggesting right atrial pressure of 3 mmHg. FINDINGS  Left Ventricle: Left ventricular ejection fraction, by estimation, is 60 to 65%. The left ventricle has normal function. The left ventricle has no regional wall motion abnormalities. Definity contrast agent was given IV to delineate the left ventricular  endocardial borders. The left ventricular internal cavity size was normal in size. There is mild concentric left ventricular hypertrophy. Left ventricular diastolic parameters are indeterminate. Right Ventricle: The right ventricular size is normal. No increase in right ventricular wall thickness. Right ventricular  systolic function is normal. Tricuspid regurgitation signal is inadequate for assessing PA pressure. Left Atrium: Left atrial size was severely dilated. Right Atrium: Right atrial size was normal in size. Pericardium: There is no evidence of pericardial effusion. Mitral Valve: The mitral valve is normal in structure. No evidence of mitral valve regurgitation. No evidence of mitral valve stenosis. Tricuspid Valve: The tricuspid valve is normal in structure. Tricuspid valve regurgitation is not demonstrated. No evidence of tricuspid stenosis. Aortic Valve: The aortic valve is normal in structure. Aortic valve regurgitation is not visualized. No aortic stenosis is present. Pulmonic Valve: The pulmonic valve was normal in structure. Pulmonic valve regurgitation is not visualized. No evidence of pulmonic stenosis. Aorta: The aortic root is normal in size and structure. Venous: The inferior vena cava is normal in size with greater than 50% respiratory variability, suggesting right atrial pressure of 3 mmHg. IAS/Shunts: No atrial level shunt detected by color flow Doppler.  LEFT VENTRICLE PLAX 2D LVIDd:         4.80 cm   Diastology LVIDs:         3.30 cm   LV e' medial:    6.74 cm/s LV PW:         1.20 cm   LV E/e' medial:  14.8 LV IVS:        1.20 cm   LV e' lateral:   6.74 cm/s LVOT diam:     2.40 cm   LV E/e' lateral: 14.8 LV SV:         91 LV SV Index:   39 LVOT Area:     4.52 cm  IVC IVC diam: 2.30 cm LEFT ATRIUM            Index LA diam:      4.60 cm  1.98 cm/m LA Vol (A2C): 47.8 ml  20.57  ml/m LA Vol (A4C): 163.0 ml 70.13 ml/m  AORTIC VALVE LVOT Vmax:   97.00 cm/s LVOT Vmean:  72.700 cm/s LVOT VTI:    0.202 m  AORTA Ao Root diam: 3.20 cm Ao Asc diam:  2.60 cm MITRAL VALVE MV Area (PHT): 3.60 cm     SHUNTS MV Decel Time: 211 msec     Systemic VTI:  0.20 m MV E velocity: 100.00 cm/s  Systemic Diam: 2.40 cm MV A velocity: 94.90 cm/s MV E/A ratio:  1.05 Kardie Tobb DO Electronically signed by Thomasene Ripple DO  Signature Date/Time: 05/17/2023/2:31:18 PM    Final    CT Angio Chest PE W and/or Wo Contrast  Result Date: 05/16/2023 CLINICAL DATA:  Concern for pulmonary embolism. EXAM: CT ANGIOGRAPHY CHEST WITH CONTRAST TECHNIQUE: Multidetector CT imaging of the chest was performed using the standard protocol during bolus administration of intravenous contrast. Multiplanar CT image reconstructions and MIPs were obtained to evaluate the vascular anatomy. RADIATION DOSE REDUCTION: This exam was performed according to the departmental dose-optimization program which includes automated exposure control, adjustment of the mA and/or kV according to patient size and/or use of iterative reconstruction technique. CONTRAST:  80mL OMNIPAQUE IOHEXOL 350 MG/ML SOLN COMPARISON:  Chest CT dated 09/16/2021. FINDINGS: Cardiovascular: There is no cardiomegaly. Small pericardial effusion measuring 11 mm in thickness. Coronary vascular calcifications noted. Mild atherosclerotic calcification of the thoracic aorta. No aneurysm or dilatation. No pulmonary artery embolus identified. Mediastinum/Nodes: No hilar or mediastinal adenopathy. Small hiatal hernia. The esophagus is grossly unremarkable. No mediastinal fluid collection. Lungs/Pleura: Small bilateral pleural effusions with partial compressive atelectasis of the lower lobes. There is no pneumothorax. There is diffuse interstitial and interlobular septal prominence consistent with edema. Upper Abdomen: No acute abnormality. Musculoskeletal: There is osteopenia with degenerative changes of the spine. Multiple osseous sclerotic lesions most consistent with metastatic disease. Correlation with history of known malignancy recommended. Several old left rib fractures noted. No acute osseous pathology. Review of the MIP images confirms the above findings. IMPRESSION: 1. No CT evidence of pulmonary embolism. 2. Small bilateral pleural effusions with partial compressive atelectasis of the lower lobes.  3. Small pericardial effusion. 4. Multiple osseous sclerotic lesions most consistent with metastatic disease. Correlation with history of known malignancy recommended. 5.  Aortic Atherosclerosis (ICD10-I70.0). Electronically Signed   By: Elgie Collard M.D.   On: 05/16/2023 16:15   DG Chest 2 View  Result Date: 05/16/2023 CLINICAL DATA:  72 year old male with history of shortness of breath. EXAM: CHEST - 2 VIEW COMPARISON:  Chest x-ray 04/09/2022. FINDINGS: Compared to the prior study, the heart now appears enlarged. There is cephalization of the pulmonary vasculature and slight indistinctness of the interstitial markings suggestive of mild pulmonary edema. Trace bilateral pleural effusions. No confluent consolidative airspace disease. No pneumothorax. Upper mediastinal contours are within normal limits. IMPRESSION: 1. The appearance of the chest is concerning for potential congestive heart failure, as above. Electronically Signed   By: Trudie Reed M.D.   On: 05/16/2023 13:46    Microbiology: Results for orders placed or performed during the hospital encounter of 05/16/23  Resp panel by RT-PCR (RSV, Flu A&B, Covid) Anterior Nasal Swab     Status: None   Collection Time: 05/16/23  6:30 PM   Specimen: Anterior Nasal Swab  Result Value Ref Range Status   SARS Coronavirus 2 by RT PCR NEGATIVE NEGATIVE Final    Comment: (NOTE) SARS-CoV-2 target nucleic acids are NOT DETECTED.  The SARS-CoV-2 RNA is generally detectable in  upper respiratory specimens during the acute phase of infection. The lowest concentration of SARS-CoV-2 viral copies this assay can detect is 138 copies/mL. A negative result does not preclude SARS-Cov-2 infection and should not be used as the sole basis for treatment or other patient management decisions. A negative result may occur with  improper specimen collection/handling, submission of specimen other than nasopharyngeal swab, presence of viral mutation(s) within  the areas targeted by this assay, and inadequate number of viral copies(<138 copies/mL). A negative result must be combined with clinical observations, patient history, and epidemiological information. The expected result is Negative.  Fact Sheet for Patients:  BloggerCourse.com  Fact Sheet for Healthcare Providers:  SeriousBroker.it  This test is no t yet approved or cleared by the [place] FDA and  has been authorized for detection and/or diagnosis of SARS-CoV-2 by FDA under an Emergency Use Authorization (EUA). This EUA will remain  in effect (meaning this test can be used) for the duration of the COVID-19 declaration under Section 564(b)(1) of the Act, 21 U.S.C.section 360bbb-3(b)(1), unless the authorization is terminated  or revoked sooner.       Influenza A by PCR NEGATIVE NEGATIVE Final   Influenza B by PCR NEGATIVE NEGATIVE Final    Comment: (NOTE) The Xpert Xpress SARS-CoV-2/FLU/RSV plus assay is intended as an aid in the diagnosis of influenza from Nasopharyngeal swab specimens and should not be used as a sole basis for treatment. Nasal washings and aspirates are unacceptable for Xpert Xpress SARS-CoV-2/FLU/RSV testing.  Fact Sheet for Patients: BloggerCourse.com  Fact Sheet for Healthcare Providers: SeriousBroker.it  This test is not yet approved or cleared by the [place] FDA and has been authorized for detection and/or diagnosis of SARS-CoV-2 by FDA under an Emergency Use Authorization (EUA). This EUA will remain in effect (meaning this test can be used) for the duration of the COVID-19 declaration under Section 564(b)(1) of the Act, 21 U.S.C. section 360bbb-3(b)(1), unless the authorization is terminated or revoked.     Resp Syncytial Virus by PCR NEGATIVE NEGATIVE Final    Comment: (NOTE) Fact Sheet for  Patients: BloggerCourse.com  Fact Sheet for Healthcare Providers: SeriousBroker.it  This test is not yet approved or cleared by the [place] FDA and has been authorized for detection and/or diagnosis of SARS-CoV-2 by FDA under an Emergency Use Authorization (EUA). This EUA will remain in effect (meaning this test can be used) for the duration of the COVID-19 declaration under Section 564(b)(1) of the Act, 21 U.S.C. section 360bbb-3(b)(1), unless the authorization is terminated or revoked.  Performed at [facility], [address], [place], [place] [address]   Body fluid culture w Gram Stain     Status: None   Collection Time: 05/19/23 12:51 PM   Specimen: Synovium; Body Fluid  Result Value Ref Range Status   Specimen Description   Final    SYNOVIAL R Knee Performed at [facility], [address] [place], [place] [address]    Special Requests   Final    NONE Performed at [facility], [address] [place], [place] [address]    Gram Stain   Final    MODERATE WBC PRESENT, PREDOMINANTLY PMN NO ORGANISMS SEEN    Culture   Final    NO GROWTH 3 DAYS Performed at [facility], [address] [place], [place] [address]    Report Status 05/22/2023 FINAL  Final    Labs: CBC: Recent Labs  Lab 05/16/23 1406  05/17/23 0417 05/18/23 0429 05/19/23 0416 05/20/23 0414  WBC 4.2 3.3* 3.2* 3.8* 4.0  NEUTROABS 1.4*  --   --   --   --   HGB 12.9* 12.9* 12.9* 12.7* 13.6  HCT 38.7* 39.9 40.0 39.8 40.7  MCV 102.7* 105.8* 106.7* 105.3* 104.6*  PLT 137* 130* 120* 122* 154   Basic Metabolic Panel: Recent Labs  Lab 05/18/23 0429 05/19/23 0416 05/20/23 0414 05/21/23 0916 05/22/23 0420 05/23/23 0412  NA 138 136 135 136 138 139  K 3.1* 3.3* 4.0 3.6 3.2* 4.2  CL 99 97* 99 99 100 101  CO2 28 29 29 24 28 30   GLUCOSE 114* 111* 141* 111* 120* 102*  BUN 26* 31*  30* 34* 41* 46*  CREATININE 0.75 0.92 0.72 0.85 0.92 1.10  CALCIUM 8.0* 8.0* 8.2* 8.3* 8.1* 8.2*  MG 2.2 2.3 2.5*  --   --  2.6*   Liver Function Tests: No results for input(s): "AST", "ALT", "ALKPHOS", "BILITOT", "PROT", "ALBUMIN" in the last 168 hours. CBG: No results for input(s): "GLUCAP" in the last 168 hours.  Discharge time spent: greater than 30 minutes.  Signed: Thad Ranger, MD [facility] 05/23/2023

## 2023-05-23 NOTE — Progress Notes (Signed)
Rounding Note    Patient Name: Jesse Mcclure. Date of Encounter: 05/23/2023  [facility] Cardiologist: New, wants to estbalish with Nishan  Subjective   SOB resolved  Inpatient Medications    Scheduled Meds:  acyclovir  400 mg Oral Daily   atorvastatin  20 mg Oral Daily   diclofenac Sodium  2 g Topical QID   empagliflozin  10 mg Oral Daily   folic acid  0.5 mg Oral Daily   predniSONE  50 mg Oral Q breakfast   rivaroxaban  10 mg Oral Q supper   sodium chloride flush  3 mL Intravenous Q12H   Continuous Infusions:  PRN Meds: acetaminophen, albuterol, mouth rinse, oxyCODONE   Vital Signs    Vitals:   05/22/23 2044 05/22/23 2050 05/23/23 0543 05/23/23 0545  BP: (!) 142/85 138/89  (!) 146/98  Pulse: 61   60  Resp:      Temp: 97.8 F (36.6 C)   97.7 F (36.5 C)  TempSrc: Oral   Oral  SpO2: 92%   96%  Weight:   109.1 kg   Height:        Intake/Output Summary (Last 24 hours) at 05/23/2023 0827 Last data filed at 05/23/2023 0500 Gross per 24 hour  Intake 1840 ml  Output 3275 ml  Net -1435 ml      05/23/2023    5:43 AM 05/22/2023    5:14 AM 05/21/2023    5:40 AM  Last 3 Weights  Weight (lbs) 240 lb 8.4 oz 246 lb 0.5 oz 244 lb 11.4 oz  Weight (kg) 109.1 kg 111.6 kg 111 kg      Telemetry    NSR - Personally Reviewed  ECG    N/a - Personally Reviewed  Physical Exam   GEN: No acute distress.   Neck: No JVD Cardiac: RRR, no murmurs, rubs, or gallops.  Respiratory: Clear to auscultation bilaterally. GI: Soft, nontender, non-distended  MS: trace bilateral edema Neuro:  Nonfocal  Psych: Normal affect   Labs    High Sensitivity Troponin:   Recent Labs  Lab 05/16/23 1406 05/16/23 1655  TROPONINIHS 17 20*     Chemistry Recent Labs  Lab 05/19/23 0416 05/20/23 0414 05/21/23 0916 05/22/23 0420 05/23/23 0412  NA 136 135 136 138 139  K 3.3* 4.0 3.6 3.2* 4.2  CL 97* 99 99 100 101  CO2 29 29 24 28 30   GLUCOSE 111* 141* 111* 120*  102*  BUN 31* 30* 34* 41* 46*  CREATININE 0.92 0.72 0.85 0.92 1.10  CALCIUM 8.0* 8.2* 8.3* 8.1* 8.2*  MG 2.3 2.5*  --   --  2.6*  GFRNONAA >60 >60 >60 >60 >60  ANIONGAP 10 7 13 10 8     Lipids No results for input(s): "CHOL", "TRIG", "HDL", "LABVLDL", "LDLCALC", "CHOLHDL" in the last 168 hours.  Hematology Recent Labs  Lab 05/18/23 0429 05/19/23 0416 05/20/23 0414  WBC 3.2* 3.8* 4.0  RBC 3.75* 3.78* 3.89*  HGB 12.9* 12.7* 13.6  HCT 40.0 39.8 40.7  MCV 106.7* 105.3* 104.6*  MCH 34.4* 33.6 35.0*  MCHC 32.3 31.9 33.4  RDW 14.5 14.3 13.9  PLT 120* 122* 154   Thyroid No results for input(s): "TSH", "FREET4" in the last 168 hours.  BNP Recent Labs  Lab 05/16/23 1406  BNP 353.2*    DDimer No results for input(s): "DDIMER" in the last 168 hours.   Radiology    No results found.  Cardiac Studies  Patient Profile     Jesse Mcclure. is a 72 y.o. adult with a hx of HTN, BPH, HLD, multiple myeloma, hx of polio, who is being seen 05/21/2023 for the evaluation of C/f CHF exacerbation at the request of Dr. Isidoro Donning   Assessment & Plan    1.Acute HFpEF - 05/2023 echo: LVEF 60-65%, indet diastolic function, normal RV, severe LAE would suggest long standing significant elevated LA pressures - CXR +CHF, CT small bilateral effusions, BNP 353   - negative 1 L yesterday and 9.8 L since admission. By weights 263-->240 lbs. He received IV lasix 40mg  x 2 yesterday, slight uptrend in Cr. Appears near euvolemic, transition to oral lasix 40mg  bid.  -started jardiance 10mg  daily in setting of HFpEF    Ok for discharge from cardiac standpoint.  Has f/u with [facility] 06/08/23.   For questions or updates, please contact [facility] Please consult [email or web address] for contact info under        Signed, Dina Rich, MD  05/23/2023, 8:27 AM

## 2023-05-23 NOTE — Plan of Care (Signed)
  Problem: Education: Goal: Knowledge of General Education information will improve Description: Including pain rating scale, medication(s)/side effects and non-pharmacologic comfort measures Outcome: Progressing   Problem: Activity: Goal: Risk for activity intolerance will decrease Outcome: Progressing   Problem: Nutrition: Goal: Adequate nutrition will be maintained Outcome: Progressing   

## 2023-05-24 ENCOUNTER — Telehealth: Payer: Self-pay | Admitting: *Deleted

## 2023-05-24 NOTE — Transitions of Care (Post Inpatient/ED Visit) (Signed)
05/24/2023  Name: Jesse Mcclure. MRN: [id number] DOB: December 26, 1950  Today's TOC FU Call Status: Today's TOC FU Call Status:: Successful TOC FU Call Completed TOC FU Call Complete Date: 05/24/23  Transition Care Management Follow-up Telephone Call Date of Discharge: 05/23/23 Discharge Facility: [facility]) Type of Discharge: Inpatient Admission Primary Inpatient Discharge Diagnosis:: acute diastolic chf How have you been since you were released from the hospital?: Better Any questions or concerns?: No  Items Reviewed: Did you receive and understand the discharge instructions provided?: Yes Medications obtained,verified, and reconciled?: Yes (Medications Reviewed) Dietary orders reviewed?: No Do you have support at home?: Yes People in Home: significant other Name of Support/Comfort Primary Source: Ron  Medications Reviewed Today: Medications Reviewed Today     Reviewed by Luella Cook, RN (Case Manager) on 05/24/23 at 1537  Med List Status: <None>   Medication Order Taking? Sig Documenting Provider Last Dose Status Informant  acyclovir (ZOVIRAX) 400 MG tablet [id number] Yes Take 1 tablet (400 mg total) by mouth daily. Artis Delay, MD Taking Active Self  atorvastatin (LIPITOR) 20 MG tablet [id number] Yes Take 1 tablet (20 mg total) by mouth daily. TAKE 1 TABLET(20 MG) BY MOUTH DAILY Tysinger, Kermit Balo, PA-C Taking Active Self  calcium carbonate (TUMS - DOSED IN MG ELEMENTAL CALCIUM) 500 MG chewable tablet [id number]  Chew 1 tablet by mouth 2 (two) times daily. [provider]  Active Self           Med Note Sherilyn Cooter, [facility]   Wed Apr 22, 2022  1:31 PM) Prn last dose over one month.  Cholecalciferol (VITAMIN D) 50 MCG (2000 UT) tablet [id number] Yes Take 2,000 Units by mouth daily. [provider] Taking Active Self  diclofenac Sodium (VOLTAREN) 1 % GEL [id number] Yes Apply 2 g topically 4 (four) times daily. Apply to knee Rai, Ripudeep K, MD Taking Active    empagliflozin (JARDIANCE) 10 MG TABS tablet [id number] Yes Take 1 tablet (10 mg total) by mouth daily. Rai, Delene Ruffini, MD Taking Active   folic acid (FOLVITE) 400 MCG tablet [id number] Yes Take 400 mcg by mouth daily. [provider] Taking Active Self  furosemide (LASIX) 40 MG tablet [id number] Yes Take 1 tablet (40 mg total) by mouth 2 (two) times daily. Rai, Delene Ruffini, MD Taking Active   ixazomib citrate (NINLARO) 3 MG capsule [id number] Yes Take 1 capsule (3 mg) by mouth weekly, 3 weeks on, 1 week off, repeat every 4 weeks. Take on an empty stomach 1hr before or 2hr after meals. Artis Delay, MD Taking Active Self  losartan (COZAAR) 100 MG tablet [id number] Yes Take 1 tablet (100 mg total) by mouth daily. Cathren Harsh, MD Taking Active   Multiple Vitamin (MULTI-VITAMIN) tablet [id number] Yes Take 1 tablet by mouth daily. [provider] Taking Active Self  Polyethyl Glycol-Propyl Glycol (SYSTANE OP) [id number] Yes Place 1 drop into both eyes daily as needed (dry eyes). [provider] Taking Active Self  pomalidomide (POMALYST) 3 MG capsule [id number] Yes Take 1 capsule (3mg ) by mouth daily for 21 days, rest 7 days, repeat every 28 days. Artis Delay, MD Taking Active Self  predniSONE (DELTASONE) 10 MG tablet [id number] Yes Prednisone dosing: Take  Prednisone 40mg  (4 tabs) x 2 days, then taper to 30mg  (3 tabs) x 2 days, then 20mg  (2 tabs) x 2 days, then 10mg  (1 tab) x 2days, then OFF. Rai, Delene Ruffini, MD Taking Active   primidone (MYSOLINE) 50 MG tablet [id number] Yes  Take 1 tablet (50 mg total) by mouth at bedtime. Henreitta Leber, MD Taking Active Self  prochlorperazine (COMPAZINE) 10 MG tablet [id number] Yes TAKE 1 TABLET(10 MG) BY MOUTH EVERY 6 HOURS AS NEEDED FOR NAUSEA OR VOMITING Bertis Ruddy, Ni, MD Taking Active Self  rivaroxaban (XARELTO) 10 MG TABS tablet [id number] Yes Take 1 tablet (10 mg total) by mouth daily with supper. Artis Delay, MD Taking Active Self  vitamin  B-12 (CYANOCOBALAMIN) 1000 MCG tablet [id number] Yes Take 1,000 mcg by mouth daily. [provider] Taking Active Self            Home Care and Equipment/Supplies: Were Home Health Services Ordered?: NA Any new equipment or medical supplies ordered?: NA  Functional Questionnaire: Do you need assistance with bathing/showering or dressing?: No Do you need assistance with meal preparation?: No Do you need assistance with eating?: No Do you have difficulty maintaining continence: No Do you need assistance with getting out of bed/getting out of a chair/moving?: No Do you have difficulty managing or taking your medications?: No  Follow up appointments reviewed: PCP Follow-up appointment confirmed?: Yes Date of PCP follow-up appointment?: 05/31/23 Follow-up Provider: [facility] Follow-up appointment confirmed?: Yes Date of Specialist follow-up appointment?: 06/08/23 Follow-Up Specialty Provider:: [facility] Do you need transportation to your follow-up appointment?: No Do you understand care options if your condition(s) worsen?: Yes-patient verbalized understanding  SDOH Interventions Today    Flowsheet Row Most Recent Value  SDOH Interventions   Food Insecurity Interventions Intervention Not Indicated  Housing Interventions Intervention Not Indicated  Transportation Interventions Intervention Not Indicated      Interventions Today    Flowsheet Row Most Recent Value  General Interventions   General Interventions Discussed/Reviewed General Interventions Discussed, General Interventions Reviewed, Doctor Visits  Doctor Visits Discussed/Reviewed Doctor Visits Discussed, Doctor Visits Reviewed  Pharmacy Interventions   Pharmacy Dicussed/Reviewed Pharmacy Topics Discussed, Pharmacy Topics Reviewed      TOC Interventions Today    Flowsheet Row Most Recent Value  TOC Interventions   TOC Interventions Discussed/Reviewed TOC  Interventions Discussed, TOC Interventions Reviewed        Gean Maidens BSN RN [facility] [phone]

## 2023-05-25 ENCOUNTER — Ambulatory Visit: Payer: Medicare Other | Admitting: Physical Therapy

## 2023-05-26 ENCOUNTER — Other Ambulatory Visit: Payer: Self-pay | Admitting: *Deleted

## 2023-05-26 DIAGNOSIS — C9000 Multiple myeloma not having achieved remission: Secondary | ICD-10-CM

## 2023-05-26 MED ORDER — POMALIDOMIDE 3 MG PO CAPS: ORAL_CAPSULE | ORAL | 0 refills | Status: DC

## 2023-05-26 NOTE — Telephone Encounter (Signed)
Patient called and requested refill of Pomalyst be sent to [facility]. Stated he starts his week off tomorrow. [facility] called and requested refill on patient's behalf

## 2023-05-27 ENCOUNTER — Telehealth: Payer: Self-pay

## 2023-05-27 DIAGNOSIS — L989 Disorder of the skin and subcutaneous tissue, unspecified: Secondary | ICD-10-CM | POA: Diagnosis not present

## 2023-05-27 DIAGNOSIS — C4339 Malignant melanoma of other parts of face: Secondary | ICD-10-CM | POA: Diagnosis not present

## 2023-05-27 NOTE — Telephone Encounter (Signed)
Called and left a message asking him to call the office back to give update on how he is doing since coming home from the hospital.

## 2023-05-27 NOTE — Telephone Encounter (Signed)
-----   Message from Artis Delay sent at 05/27/2023  8:37 AM EDT ----- Can you call him? He was recently DC from hospital, make sure he is ok

## 2023-05-27 NOTE — Telephone Encounter (Signed)
Called him back to see how he is doing. He said that he to doing okay and appreciated the call. He will call the office back for questions.

## 2023-05-28 ENCOUNTER — Encounter: Payer: Medicare Other | Admitting: Physical Therapy

## 2023-05-31 ENCOUNTER — Other Ambulatory Visit: Payer: Self-pay | Admitting: Hematology and Oncology

## 2023-05-31 DIAGNOSIS — C9000 Multiple myeloma not having achieved remission: Secondary | ICD-10-CM

## 2023-06-01 ENCOUNTER — Encounter: Payer: Medicare Other | Admitting: Physical Therapy

## 2023-06-01 ENCOUNTER — Encounter: Payer: Self-pay | Admitting: Family Medicine

## 2023-06-01 ENCOUNTER — Ambulatory Visit (INDEPENDENT_AMBULATORY_CARE_PROVIDER_SITE_OTHER): Payer: Medicare Other | Admitting: Family Medicine

## 2023-06-01 VITALS — BP 130/66 | HR 71 | Wt 239.0 lb

## 2023-06-01 DIAGNOSIS — M10062 Idiopathic gout, left knee: Secondary | ICD-10-CM | POA: Diagnosis not present

## 2023-06-01 DIAGNOSIS — M545 Low back pain, unspecified: Secondary | ICD-10-CM | POA: Diagnosis not present

## 2023-06-01 DIAGNOSIS — G250 Essential tremor: Secondary | ICD-10-CM

## 2023-06-01 DIAGNOSIS — G8929 Other chronic pain: Secondary | ICD-10-CM | POA: Diagnosis not present

## 2023-06-01 DIAGNOSIS — I5031 Acute diastolic (congestive) heart failure: Secondary | ICD-10-CM | POA: Diagnosis not present

## 2023-06-01 DIAGNOSIS — D0322 Melanoma in situ of left ear and external auricular canal: Secondary | ICD-10-CM | POA: Diagnosis not present

## 2023-06-01 DIAGNOSIS — M1000 Idiopathic gout, unspecified site: Secondary | ICD-10-CM | POA: Diagnosis not present

## 2023-06-01 DIAGNOSIS — I10 Essential (primary) hypertension: Secondary | ICD-10-CM | POA: Diagnosis not present

## 2023-06-01 NOTE — Progress Notes (Signed)
   Subjective:    Patient ID: Jesse Mcclure., adult    DOB: Apr 19, 1951, 72 y.o.   MRN: [id number]  HPI He is here for transition of care, review of his medical record.  He is admitted to the hospital and treated for congestive heart failure.  He did lose over 20 pounds and states that he is feeling much better with less shortness of breath, sleeping better with more energy.  Presently no chest pain, shortness of breath, palpitations or PND.  While in the hospital he was diagnosed with gout and fluid was drained which did show crystals.  He was given prednisone and is on the tail end of this and seems to be doing well.  He has never had a previous episode of gout.  He continues to have difficulty with low back pain and is scheduled for an MRI to follow-up from several years ago when the back pain was the initiating symptom of the multiple myeloma.  He has had a tremor and did use primidone however although it help with the tremor he did not like the side effects and is not using it at the present time.  He recently had left ear surgery for treatment of melanoma in situ.   Review of Systems     Objective:    Physical Exam Alert and in no distress.  Cardiac exam shows a regular rhythm without murmurs or gallops.  Lungs are clear to auscultation.  Exam of the left ear does show healing surgical scar.  Right knee shows no erythema or effusion. Medical record including lab work and discharge summary as well as hospital notes was evaluated.      Assessment & Plan:   Problem List Items Addressed This Visit     Acute diastolic (congestive) heart failure (HCC) - Primary   Relevant Orders   CBC with Differential/Platelet   Comprehensive metabolic panel   Magnesium   Chronic midline low back pain without sciatica   Essential hypertension   Relevant Orders   Magnesium   Essential tremor   Relevant Orders   CBC with Differential/Platelet   Comprehensive metabolic panel   Idiopathic gout    Melanoma in situ [facility])  He will continue on his present medication regimen and follow-up with oncology as well as cardiology. The surgical scar seems to be doing quite nicely. He is having no difficulty with the right knee.  Discussed treatment with allopurinol and the potential for gout reoccurrence and during the treatment phase.  He would like to hold off on treating this if possible.  I explained that if this occurs again then we will be forced to give him allopurinol and have to figure out how to best handle the potential problems from the allopurinol. Recommend he discuss with neurology the fact that the primidone works but he did not like the side effects.

## 2023-06-02 LAB — COMPREHENSIVE METABOLIC PANEL
ALT: 45 IU/L — ABNORMAL HIGH (ref 0–44)
AST: 25 IU/L (ref 0–40)
Albumin: 4.1 g/dL (ref 3.8–4.8)
Alkaline Phosphatase: 76 IU/L (ref 44–121)
BUN/Creatinine Ratio: 27 — ABNORMAL HIGH (ref 10–24)
BUN: 26 mg/dL (ref 8–27)
Bilirubin Total: 0.9 mg/dL (ref 0.0–1.2)
CO2: 26 mmol/L (ref 20–29)
Calcium: 9 mg/dL (ref 8.6–10.2)
Chloride: 97 mmol/L (ref 96–106)
Creatinine, Ser: 0.98 mg/dL (ref 0.76–1.27)
Globulin, Total: 2.2 g/dL (ref 1.5–4.5)
Glucose: 93 mg/dL (ref 70–99)
Potassium: 3.6 mmol/L (ref 3.5–5.2)
Sodium: 139 mmol/L (ref 134–144)
Total Protein: 6.3 g/dL (ref 6.0–8.5)
eGFR: 82 mL/min/{1.73_m2} (ref >59)

## 2023-06-02 LAB — CBC WITH DIFFERENTIAL/PLATELET
Basophils Absolute: 0.1 10*3/uL (ref 0.0–0.2)
Basos: 1 %
EOS (ABSOLUTE): 0.1 10*3/uL (ref 0.0–0.4)
Eos: 1 %
Hematocrit: 47.3 % (ref 37.5–51.0)
Hemoglobin: 15.6 g/dL (ref 13.0–17.7)
Immature Grans (Abs): 0.1 10*3/uL (ref 0.0–0.1)
Immature Granulocytes: 1 %
Lymphocytes Absolute: 2.3 10*3/uL (ref 0.7–3.1)
Lymphs: 23 %
MCH: 33 pg (ref 26.6–33.0)
MCHC: 33 g/dL (ref 31.5–35.7)
MCV: 100 fL — ABNORMAL HIGH (ref 79–97)
Monocytes Absolute: 1 10*3/uL — ABNORMAL HIGH (ref 0.1–0.9)
Monocytes: 10 %
Neutrophils Absolute: 6.6 10*3/uL (ref 1.4–7.0)
Neutrophils: 64 %
Platelets: 295 10*3/uL (ref 150–450)
RBC: 4.73 x10E6/uL (ref 4.14–5.80)
RDW: 13.6 % (ref 11.6–15.4)
WBC: 10.1 10*3/uL (ref 3.4–10.8)

## 2023-06-02 LAB — MAGNESIUM: Magnesium: 2.6 mg/dL — ABNORMAL HIGH (ref 1.6–2.3)

## 2023-06-03 ENCOUNTER — Encounter: Payer: Medicare Other | Admitting: Physical Therapy

## 2023-06-04 ENCOUNTER — Inpatient Hospital Stay: Payer: Medicare Other | Admitting: Hematology and Oncology

## 2023-06-04 ENCOUNTER — Inpatient Hospital Stay: Payer: Medicare Other

## 2023-06-04 ENCOUNTER — Encounter: Payer: Self-pay | Admitting: Hematology and Oncology

## 2023-06-04 VITALS — BP 108/63 | HR 105 | Temp 99.0°F | Resp 18 | Ht 69.0 in | Wt 237.6 lb

## 2023-06-04 DIAGNOSIS — Z86711 Personal history of pulmonary embolism: Secondary | ICD-10-CM | POA: Diagnosis not present

## 2023-06-04 DIAGNOSIS — Z79899 Other long term (current) drug therapy: Secondary | ICD-10-CM | POA: Diagnosis not present

## 2023-06-04 DIAGNOSIS — C9000 Multiple myeloma not having achieved remission: Secondary | ICD-10-CM

## 2023-06-04 DIAGNOSIS — I2510 Atherosclerotic heart disease of native coronary artery without angina pectoris: Secondary | ICD-10-CM | POA: Diagnosis not present

## 2023-06-04 DIAGNOSIS — G620 Drug-induced polyneuropathy: Secondary | ICD-10-CM | POA: Diagnosis not present

## 2023-06-04 DIAGNOSIS — Z79624 Long term (current) use of inhibitors of nucleotide synthesis: Secondary | ICD-10-CM | POA: Diagnosis not present

## 2023-06-04 DIAGNOSIS — I5031 Acute diastolic (congestive) heart failure: Secondary | ICD-10-CM | POA: Diagnosis not present

## 2023-06-04 DIAGNOSIS — E785 Hyperlipidemia, unspecified: Secondary | ICD-10-CM | POA: Diagnosis not present

## 2023-06-04 DIAGNOSIS — G250 Essential tremor: Secondary | ICD-10-CM | POA: Diagnosis not present

## 2023-06-04 DIAGNOSIS — I10 Essential (primary) hypertension: Secondary | ICD-10-CM | POA: Diagnosis not present

## 2023-06-04 DIAGNOSIS — N400 Enlarged prostate without lower urinary tract symptoms: Secondary | ICD-10-CM | POA: Diagnosis not present

## 2023-06-04 DIAGNOSIS — Z7901 Long term (current) use of anticoagulants: Secondary | ICD-10-CM | POA: Diagnosis not present

## 2023-06-04 DIAGNOSIS — T451X5A Adverse effect of antineoplastic and immunosuppressive drugs, initial encounter: Secondary | ICD-10-CM | POA: Diagnosis not present

## 2023-06-04 DIAGNOSIS — I700 Atherosclerosis of aorta: Secondary | ICD-10-CM | POA: Diagnosis not present

## 2023-06-04 DIAGNOSIS — Z9221 Personal history of antineoplastic chemotherapy: Secondary | ICD-10-CM | POA: Diagnosis not present

## 2023-06-04 DIAGNOSIS — Z7961 Long term (current) use of immunomodulator: Secondary | ICD-10-CM | POA: Diagnosis not present

## 2023-06-04 LAB — CBC WITH DIFFERENTIAL/PLATELET
Abs Immature Granulocytes: 0.04 10*3/uL (ref 0.00–0.07)
Basophils Absolute: 0.1 10*3/uL (ref 0.0–0.1)
Basophils Relative: 1 %
Eosinophils Absolute: 0.1 10*3/uL (ref 0.0–0.5)
Eosinophils Relative: 2 %
HCT: 45.5 % (ref 39.0–52.0)
Hemoglobin: 15.6 g/dL (ref 13.0–17.0)
Immature Granulocytes: 1 %
Lymphocytes Relative: 30 %
Lymphs Abs: 2.2 10*3/uL (ref 0.7–4.0)
MCH: 34.4 pg — ABNORMAL HIGH (ref 26.0–34.0)
MCHC: 34.3 g/dL (ref 30.0–36.0)
MCV: 100.2 fL — ABNORMAL HIGH (ref 80.0–100.0)
Monocytes Absolute: 1.3 10*3/uL — ABNORMAL HIGH (ref 0.1–1.0)
Monocytes Relative: 18 %
Neutro Abs: 3.7 10*3/uL (ref 1.7–7.7)
Neutrophils Relative %: 48 %
Platelets: 245 10*3/uL (ref 150–400)
RBC: 4.54 MIL/uL (ref 4.22–5.81)
RDW: 13.9 % (ref 11.5–15.5)
WBC: 7.4 10*3/uL (ref 4.0–10.5)
nRBC: 0 % (ref 0.0–0.2)

## 2023-06-04 NOTE — Progress Notes (Signed)
[facility] OFFICE PROGRESS NOTE  Patient Care Team: Ronnald Nian, MD as PCP - General (Family Medicine) Artis Delay, MD as Consulting Physician (Hematology and Oncology)  ASSESSMENT & PLAN:  Multiple myeloma without remission [facility]) He was able to resume his treatment without difficulties He is due for Zometa next cycle He will continue treatment as scheduled He will continue antiviral treatment, calcium with vitamin D  Hemochromatosis, hereditary I checked his ferritin a year ago I will recheck ferritin again next month He will get phlebotomy if his ferritin level is greater than 250  Acute diastolic (congestive) heart failure (HCC) He was feeling much better with diuresis He has appointment to follow-up with cardiologist next week I will defer to them for further management  History of pulmonary embolus (PE) Patient prior PE was provoked He will complete a years worth of anticoagulation therapy this month  However he is at risk due to pomalyst I recommend secondary prevention with reduced dose xarelto  Orders Placed This Encounter  Procedures   Comprehensive metabolic panel    Standing Status:   Standing    Number of Occurrences:   33    Standing Expiration Date:   06/03/2024   Ferritin    Standing Status:   Future    Standing Expiration Date:   06/03/2024    All questions were answered. The patient knows to call the clinic with any problems, questions or concerns. The total time spent in the appointment was 30 minutes encounter with patients including review of chart and various tests results, discussions about plan of care and coordination of care plan   Artis Delay, MD 06/04/2023 3:22 PM  INTERVAL HISTORY: Please see below for problem oriented charting. he returns for treatment follow-up He is doing well since hospital discharge He denies chest pain or shortness of breath He is tolerating aggressive diuresis well We discussed plan of care from  chemo perspective   REVIEW OF SYSTEMS:   Constitutional: Denies fevers, chills or abnormal weight loss Eyes: Denies blurriness of vision Ears, nose, mouth, throat, and face: Denies mucositis or sore throat Respiratory: Denies cough, dyspnea or wheezes Cardiovascular: Denies palpitation, chest discomfort or lower extremity swelling Gastrointestinal:  Denies nausea, heartburn or change in bowel habits Skin: Denies abnormal skin rashes Lymphatics: Denies new lymphadenopathy or easy bruising Neurological:Denies numbness, tingling or new weaknesses Behavioral/Psych: Mood is stable, no new changes  All other systems were reviewed with the patient and are negative.  I have reviewed the past medical history, past surgical history, social history and family history with the patient and they are unchanged from previous note.  ALLERGIES:  has No Known Allergies.  MEDICATIONS:  Current Outpatient Medications  Medication Sig Dispense Refill   acyclovir (ZOVIRAX) 400 MG tablet TAKE 1 TABLET(400 MG) BY MOUTH DAILY 90 tablet 1   atorvastatin (LIPITOR) 20 MG tablet Take 1 tablet (20 mg total) by mouth daily. TAKE 1 TABLET(20 MG) BY MOUTH DAILY 90 tablet 0   calcium carbonate (TUMS - DOSED IN MG ELEMENTAL CALCIUM) 500 MG chewable tablet Chew 1 tablet by mouth 2 (two) times daily.     Cholecalciferol (VITAMIN D) 50 MCG (2000 UT) tablet Take 2,000 Units by mouth daily.     diclofenac Sodium (VOLTAREN) 1 % GEL Apply 2 g topically 4 (four) times daily. Apply to knee 50 g 2   empagliflozin (JARDIANCE) 10 MG TABS tablet Take 1 tablet (10 mg total) by mouth daily. 30 tablet 3  folic acid (FOLVITE) 400 MCG tablet Take 400 mcg by mouth daily.     furosemide (LASIX) 40 MG tablet Take 1 tablet (40 mg total) by mouth 2 (two) times daily. 60 tablet 3   ixazomib citrate (NINLARO) 3 MG capsule Take 1 capsule (3 mg) by mouth weekly, 3 weeks on, 1 week off, repeat every 4 weeks. Take on an empty stomach 1hr before or  2hr after meals. 3 capsule 11   losartan (COZAAR) 100 MG tablet Take 1 tablet (100 mg total) by mouth daily. 30 tablet 11   Multiple Vitamin (MULTI-VITAMIN) tablet Take 1 tablet by mouth daily.     Polyethyl Glycol-Propyl Glycol (SYSTANE OP) Place 1 drop into both eyes daily as needed (dry eyes).     pomalidomide (POMALYST) 3 MG capsule Take 1 capsule (3mg ) by mouth daily for 21 days, rest 7 days, repeat every 28 days. 21 capsule 0   primidone (MYSOLINE) 50 MG tablet Take 1 tablet (50 mg total) by mouth at bedtime. 30 tablet 2   prochlorperazine (COMPAZINE) 10 MG tablet TAKE 1 TABLET(10 MG) BY MOUTH EVERY 6 HOURS AS NEEDED FOR NAUSEA OR VOMITING 60 tablet 1   rivaroxaban (XARELTO) 10 MG TABS tablet Take 1 tablet (10 mg total) by mouth daily with supper. 90 tablet 1   vitamin B-12 (CYANOCOBALAMIN) 1000 MCG tablet Take 1,000 mcg by mouth daily.     No current facility-administered medications for this visit.    SUMMARY OF ONCOLOGIC HISTORY: Oncology History  Multiple myeloma without remission (HCC)  03/20/2019 Imaging   1. Tumor involving the L5 and S1 and S2 segments of the spine as described with mass effects upon the left L4, L5, S1 and more distal left sacral nerves as described above. 2. Does the patient have a history of malignancy? 3. Multiple plasmacytomas and metastatic disease could give this appearance   03/27/2019 Pathology Results   Soft Tissue Needle Core Biopsy, sacral mass - PLASMABLASTIC NEOPLASM. - SEE COMMENT. Microscopic Comment The sections show needle core biopsy fragments of soft tissue densely infiltrated by a relatively monomorphic infiltrate of atypical plasmacytoid cells characterized by vesicular or partially clumped chromatin and prominent nucleoli. This is associated with scattered mitosis. A battery of immunohistochemical stains was performed and show that the atypical plasmacytoid cells are positive for CD138, CD43 and cytoplasmic kappa. There is also weak  positivity for CD56 and partial variable positivity for cyclin D1. The atypical plasmacytoid cells are negative for cytoplasmic lambda, CD10, PAX5, CD79a, CD20, CD3, CD5, CD34, EBV (ISH) and mostly negative for LCA. The overall morphologic and histologic features are most compatible with a plasmablastic neoplasm. The differential diagnosis includes plasmablastic lymphoma and plasmablastic plasmacytoma/myeloma. Based on the overall phenotypic features and the clinical setting, plasmacytoma/myeloma is favored. Clinical correlation and hematologic evaluation is recommended   03/27/2019 Procedure   Technically successful CT-guided biopsy of sacral soft tissue mass.   04/04/2019 Initial Diagnosis   Multiple myeloma without remission (HCC)   04/11/2019 PET scan   IMPRESSION: 1. Previously noted expansile lesions involving the L5 vertebra and sacrum are again noted and exhibit intense FDG uptake compatible with metabolically active disease. A third focus of increased uptake without corresponding CT abnormality is noted within the intertrochanteric portions of the proximal right femur. 2. Small lucent lesion within the T3 vertebra is noted without corresponding FDG uptake. 3. Asymmetric left bladder wall thickening, etiology indeterminate. 4. Aortic Atherosclerosis (ICD10-I70.0). Lad coronary artery calcification.   04/12/2019 Cancer Staging   Staging  form: Plasma Cell Myeloma and Plasma Cell Disorders, AJCC 8th Edition - Clinical stage from 04/12/2019: Beta-2-microglobulin (mg/L): 2, Albumin (g/dL): 3.6, ISS: Stage I, High-risk cytogenetics: Unknown, LDH: Unknown - Signed by Artis Delay, MD on 04/12/2019   04/14/2019 Bone Marrow Biopsy   Bone Marrow, Aspirate,Biopsy, and Clot, right iliac bone - PLASMA CELL MYELOMA.   04/17/2019 - 07/11/2019 Chemotherapy   The patient had bortezemib, revlimid and dexamethasone for chemotherapy treatment.     08/17/2019 Bone Marrow Transplant   He received high dose  melphalan followed by autologous stem cell transplant at [facility]   11/22/2019 Bone Marrow Biopsy   BONE MARROW biopsy at [facility]:       Mild monoclonal plasmacytosis (approximately 2%), kappa light chain restricted.    11/22/2019 PET scan   PET CT at [facility] 1.  Similar size and appearance of mildly hypermetabolic lytic lesion in the L5 vertebral body and posterior elements.  2.  Otherwise, no substantial FDG uptake compared to background bone marrow in the additional osseous lucent lesions.    12/20/2019 - 05/09/2020 Radiation Therapy   Radiation Treatment Dates: 12/20/2019 through 01/08/2020 Site Technique Total Dose (Gy) Dose per Fx (Gy) Completed Fx Beam Energies  Lumbar Spine: Spine 3D 35/35 2.5 14/14 15X        02/29/2020 PET scan   1. Similar size and appearance of expansile lytic lesion in the L5 vertebral body and posterior elements with minimal FDG activity.  2. Otherwise, no additional osseous lucent lesions with FDG uptake above background bone marrow   05/31/2020 -  Chemotherapy   The patient had Revlimid for chemotherapy treatment.     11/25/2021 - 05/11/2022 Chemotherapy   Patient is on Treatment Plan : MYELOMA RELAPSED / REFRACTORY Daratumumab SQ + Bortezomib + Dexamethasone (DaraVd) q21d / Daratumumab SQ q28d      11/25/2021 - 10/29/2022 Chemotherapy   Patient is on Treatment Plan : MYELOMA RELAPSED / REFRACTORY Daratumumab SQ + Bortezomib + Dexamethasone (DaraVd) q21d / Daratumumab SQ q28d        PHYSICAL EXAMINATION: ECOG PERFORMANCE STATUS: 1 - Symptomatic but completely ambulatory  Vitals:   06/04/23 1007  BP: 108/63  Pulse: (!) 105  Resp: 18  Temp: 99 F (37.2 C)  SpO2: 99%   Filed Weights   06/04/23 1007  Weight: 237 lb 9.6 oz (107.8 kg)    GENERAL:alert, no distress and comfortable  NEURO: alert & oriented x 3 with fluent speech, no focal motor/sensory deficits  LABORATORY DATA:  I have reviewed the data as listed    Component Value  Date/Time   NA 139 06/01/2023 1115   NA 139 04/05/2014 0849   K 3.6 06/01/2023 1115   K 4.4 04/05/2014 0849   CL 97 06/01/2023 1115   CO2 26 06/01/2023 1115   CO2 23 04/05/2014 0849   GLUCOSE 93 06/01/2023 1115   GLUCOSE 102 (H) 05/23/2023 0412   GLUCOSE 109 04/05/2014 0849   BUN 26 06/01/2023 1115   BUN 16.8 04/05/2014 0849   CREATININE 0.98 06/01/2023 1115   CREATININE 0.80 10/29/2022 1046   CREATININE 0.91 07/29/2017 1004   CREATININE 0.9 04/05/2014 0849   CALCIUM 9.0 06/01/2023 1115   CALCIUM 8.7 04/05/2014 0849   PROT 6.3 06/01/2023 1115   PROT 6.7 04/05/2014 0849   ALBUMIN 4.1 06/01/2023 1115   ALBUMIN 3.8 04/05/2014 0849   AST 25 06/01/2023 1115   AST 24 10/29/2022 1046   AST 26 04/05/2014 0849   ALT  45 (H) 06/01/2023 1115   ALT 31 10/29/2022 1046   ALT 30 04/05/2014 0849   ALKPHOS 76 06/01/2023 1115   ALKPHOS 75 04/05/2014 0849   BILITOT 0.9 06/01/2023 1115   BILITOT 0.9 10/29/2022 1046   BILITOT 0.71 04/05/2014 0849   GFRNONAA >60 05/23/2023 0412   GFRNONAA >60 10/29/2022 1046   GFRAA >60 06/14/2020 0830    No results found for: "SPEP", "UPEP"  Lab Results  Component Value Date   WBC 7.4 06/04/2023   NEUTROABS 3.7 06/04/2023   HGB 15.6 06/04/2023   HCT 45.5 06/04/2023   MCV 100.2 (H) 06/04/2023   PLT 245 06/04/2023      Chemistry      Component Value Date/Time   NA 139 06/01/2023 1115   NA 139 04/05/2014 0849   K 3.6 06/01/2023 1115   K 4.4 04/05/2014 0849   CL 97 06/01/2023 1115   CO2 26 06/01/2023 1115   CO2 23 04/05/2014 0849   BUN 26 06/01/2023 1115   BUN 16.8 04/05/2014 0849   CREATININE 0.98 06/01/2023 1115   CREATININE 0.80 10/29/2022 1046   CREATININE 0.91 07/29/2017 1004   CREATININE 0.9 04/05/2014 0849   GLU 10 04/10/2014 0000      Component Value Date/Time   CALCIUM 9.0 06/01/2023 1115   CALCIUM 8.7 04/05/2014 0849   ALKPHOS 76 06/01/2023 1115   ALKPHOS 75 04/05/2014 0849   AST 25 06/01/2023 1115   AST 24 10/29/2022 1046    AST 26 04/05/2014 0849   ALT 45 (H) 06/01/2023 1115   ALT 31 10/29/2022 1046   ALT 30 04/05/2014 0849   BILITOT 0.9 06/01/2023 1115   BILITOT 0.9 10/29/2022 1046   BILITOT 0.71 04/05/2014 0849       RADIOGRAPHIC STUDIES: I have personally reviewed the radiological images as listed and agreed with the findings in the report. VAS [place] LOWER EXTREMITY VENOUS (DVT)  Result Date: 05/20/2023  Lower Venous DVT Study Patient Name:  Jonhatan Gilkison.  Date of Exam:   05/19/2023 Medical Rec #: [id number]            Accession #:    [id number] Date of Birth: 1951-10-02            Patient Gender: M Patient Age:   55 years Exam Location:  [facility] Procedure:      VAS [place] LOWER EXTREMITY VENOUS (DVT) Referring Phys: PRANAV PATEL --------------------------------------------------------------------------------  Indications: Edema.  Risk Factors: None identified. Comparison Study: No prior studies. Performing Technologist: Chanda Busing RVT  Examination Guidelines: A complete evaluation includes B-mode imaging, spectral Doppler, color Doppler, and power Doppler as needed of all accessible portions of each vessel. Bilateral testing is considered an integral part of a complete examination. Limited examinations for reoccurring indications may be performed as noted. The reflux portion of the exam is performed with the patient in reverse Trendelenburg.  +---------+---------------+---------+-----------+----------+--------------+ RIGHT    CompressibilityPhasicitySpontaneityPropertiesThrombus Aging +---------+---------------+---------+-----------+----------+--------------+ CFV      Full           Yes      Yes                                 +---------+---------------+---------+-----------+----------+--------------+ SFJ      Full                                                        +---------+---------------+---------+-----------+----------+--------------+  FV Prox  Full                                                         +---------+---------------+---------+-----------+----------+--------------+ FV Mid   Full                                                        +---------+---------------+---------+-----------+----------+--------------+ FV DistalFull                                                        +---------+---------------+---------+-----------+----------+--------------+ PFV      Full                                                        +---------+---------------+---------+-----------+----------+--------------+ POP      Full           Yes      Yes                                 +---------+---------------+---------+-----------+----------+--------------+ PTV      Full                                                        +---------+---------------+---------+-----------+----------+--------------+ PERO     Full                                                        +---------+---------------+---------+-----------+----------+--------------+   +---------+---------------+---------+-----------+----------+--------------+ LEFT     CompressibilityPhasicitySpontaneityPropertiesThrombus Aging +---------+---------------+---------+-----------+----------+--------------+ CFV      Full           Yes      Yes                                 +---------+---------------+---------+-----------+----------+--------------+ SFJ      Full                                                        +---------+---------------+---------+-----------+----------+--------------+ FV Prox  Full                                                        +---------+---------------+---------+-----------+----------+--------------+  FV Mid   Full                                                        +---------+---------------+---------+-----------+----------+--------------+ FV DistalFull                                                         +---------+---------------+---------+-----------+----------+--------------+ PFV      Full                                                        +---------+---------------+---------+-----------+----------+--------------+ POP      Full           Yes      Yes                                 +---------+---------------+---------+-----------+----------+--------------+ PTV      Full                                                        +---------+---------------+---------+-----------+----------+--------------+ PERO     Full                                                        +---------+---------------+---------+-----------+----------+--------------+     Summary: RIGHT: - There is no evidence of deep vein thrombosis in the lower extremity.  - No cystic structure found in the popliteal fossa.  LEFT: - There is no evidence of deep vein thrombosis in the lower extremity.  - No cystic structure found in the popliteal fossa.  *See table(s) above for measurements and observations. Electronically signed by Sherald Hess MD on 05/20/2023 at 9:45:20 AM.    Final    DG Knee Complete 4 Views Right  Result Date: 05/18/2023 CLINICAL DATA:  Knee swelling EXAM: RIGHT KNEE - COMPLETE 4 VIEW COMPARISON:  None Available. FINDINGS: Osteopenia. Small osteophytes of the patellofemoral joint and lateral compartment. There is joint space loss of the patellofemoral joint. There is a well corticated density above the patellofemoral joint and slightly lateral. Please correlate for any previous remote injury or other soft tissue ossification. Otherwise no acute fracture or dislocation. Small joint effusion. IMPRESSION: Degenerative changes.  Greatest of the patellofemoral joint. Small joint effusion. Well corticated ossific density seen above the patella on lateral view. This could be the sequela of old trauma. Please correlate for any prior imaging. If there is further concern additional evaluation such as CT  as clinically appropriate or MRI. Electronically Signed   By: Karen Kays M.D.   On: 05/18/2023 18:57   ECHOCARDIOGRAM COMPLETE  Result Date: 05/17/2023  ECHOCARDIOGRAM REPORT   Patient Name:   Regan Cavataio. Date of Exam: 05/17/2023 Medical Rec #:  [id number]           Height:       69.0 in Accession #:    [id number]          Weight:       263.9 lb Date of Birth:  08-24-1951           BSA:          2.324 m Patient Age:    71 years            BP:           131/86 mmHg Patient Gender: M                   HR:           61 bpm. Exam Location:  Inpatient Procedure: 2D Echo, Cardiac Doppler, Color Doppler and Intracardiac            Opacification Agent Indications:    CHF- Acute Diastolic I50.31  History:        Patient has prior history of Echocardiogram examinations, most                 recent 09/17/2021. Risk Factors:Hypertension and Dyslipidemia.  Sonographer:    Harriette Bouillon RDCS Referring Phys: [id number] EMILY B MULLEN IMPRESSIONS  1. Left ventricular ejection fraction, by estimation, is 60 to 65%. The left ventricle has normal function. The left ventricle has no regional wall motion abnormalities. There is mild concentric left ventricular hypertrophy. Left ventricular diastolic parameters are indeterminate.  2. Right ventricular systolic function is normal. The right ventricular size is normal. Tricuspid regurgitation signal is inadequate for assessing PA pressure.  3. Left atrial size was severely dilated.  4. The mitral valve is normal in structure. No evidence of mitral valve regurgitation. No evidence of mitral stenosis.  5. The aortic valve is normal in structure. Aortic valve regurgitation is not visualized. No aortic stenosis is present.  6. The inferior vena cava is normal in size with greater than 50% respiratory variability, suggesting right atrial pressure of 3 mmHg. FINDINGS  Left Ventricle: Left ventricular ejection fraction, by estimation, is 60 to 65%. The left ventricle has normal function.  The left ventricle has no regional wall motion abnormalities. Definity contrast agent was given IV to delineate the left ventricular  endocardial borders. The left ventricular internal cavity size was normal in size. There is mild concentric left ventricular hypertrophy. Left ventricular diastolic parameters are indeterminate. Right Ventricle: The right ventricular size is normal. No increase in right ventricular wall thickness. Right ventricular systolic function is normal. Tricuspid regurgitation signal is inadequate for assessing PA pressure. Left Atrium: Left atrial size was severely dilated. Right Atrium: Right atrial size was normal in size. Pericardium: There is no evidence of pericardial effusion. Mitral Valve: The mitral valve is normal in structure. No evidence of mitral valve regurgitation. No evidence of mitral valve stenosis. Tricuspid Valve: The tricuspid valve is normal in structure. Tricuspid valve regurgitation is not demonstrated. No evidence of tricuspid stenosis. Aortic Valve: The aortic valve is normal in structure. Aortic valve regurgitation is not visualized. No aortic stenosis is present. Pulmonic Valve: The pulmonic valve was normal in structure. Pulmonic valve regurgitation is not visualized. No evidence of pulmonic stenosis. Aorta: The aortic root is normal in size and structure. Venous: The inferior vena cava is normal in size  with greater than 50% respiratory variability, suggesting right atrial pressure of 3 mmHg. IAS/Shunts: No atrial level shunt detected by color flow Doppler.  LEFT VENTRICLE PLAX 2D LVIDd:         4.80 cm   Diastology LVIDs:         3.30 cm   LV e' medial:    6.74 cm/s LV PW:         1.20 cm   LV E/e' medial:  14.8 LV IVS:        1.20 cm   LV e' lateral:   6.74 cm/s LVOT diam:     2.40 cm   LV E/e' lateral: 14.8 LV SV:         91 LV SV Index:   39 LVOT Area:     4.52 cm  IVC IVC diam: 2.30 cm LEFT ATRIUM            Index LA diam:      4.60 cm  1.98 cm/m LA Vol  (A2C): 47.8 ml  20.57 ml/m LA Vol (A4C): 163.0 ml 70.13 ml/m  AORTIC VALVE LVOT Vmax:   97.00 cm/s LVOT Vmean:  72.700 cm/s LVOT VTI:    0.202 m  AORTA Ao Root diam: 3.20 cm Ao Asc diam:  2.60 cm MITRAL VALVE MV Area (PHT): 3.60 cm     SHUNTS MV Decel Time: 211 msec     Systemic VTI:  0.20 m MV E velocity: 100.00 cm/s  Systemic Diam: 2.40 cm MV A velocity: 94.90 cm/s MV E/A ratio:  1.05 Kardie Tobb DO Electronically signed by Thomasene Ripple DO Signature Date/Time: 05/17/2023/2:31:18 PM    Final    CT Angio Chest PE W and/or Wo Contrast  Result Date: 05/16/2023 CLINICAL DATA:  Concern for pulmonary embolism. EXAM: CT ANGIOGRAPHY CHEST WITH CONTRAST TECHNIQUE: Multidetector CT imaging of the chest was performed using the standard protocol during bolus administration of intravenous contrast. Multiplanar CT image reconstructions and MIPs were obtained to evaluate the vascular anatomy. RADIATION DOSE REDUCTION: This exam was performed according to the departmental dose-optimization program which includes automated exposure control, adjustment of the mA and/or kV according to patient size and/or use of iterative reconstruction technique. CONTRAST:  80mL OMNIPAQUE IOHEXOL 350 MG/ML SOLN COMPARISON:  Chest CT dated 09/16/2021. FINDINGS: Cardiovascular: There is no cardiomegaly. Small pericardial effusion measuring 11 mm in thickness. Coronary vascular calcifications noted. Mild atherosclerotic calcification of the thoracic aorta. No aneurysm or dilatation. No pulmonary artery embolus identified. Mediastinum/Nodes: No hilar or mediastinal adenopathy. Small hiatal hernia. The esophagus is grossly unremarkable. No mediastinal fluid collection. Lungs/Pleura: Small bilateral pleural effusions with partial compressive atelectasis of the lower lobes. There is no pneumothorax. There is diffuse interstitial and interlobular septal prominence consistent with edema. Upper Abdomen: No acute abnormality. Musculoskeletal: There is  osteopenia with degenerative changes of the spine. Multiple osseous sclerotic lesions most consistent with metastatic disease. Correlation with history of known malignancy recommended. Several old left rib fractures noted. No acute osseous pathology. Review of the MIP images confirms the above findings. IMPRESSION: 1. No CT evidence of pulmonary embolism. 2. Small bilateral pleural effusions with partial compressive atelectasis of the lower lobes. 3. Small pericardial effusion. 4. Multiple osseous sclerotic lesions most consistent with metastatic disease. Correlation with history of known malignancy recommended. 5.  Aortic Atherosclerosis (ICD10-I70.0). Electronically Signed   By: Elgie Collard M.D.   On: 05/16/2023 16:15   DG Chest 2 View  Result Date: 05/16/2023 CLINICAL DATA:  72 year old  male with history of shortness of breath. EXAM: CHEST - 2 VIEW COMPARISON:  Chest x-ray 04/09/2022. FINDINGS: Compared to the prior study, the heart now appears enlarged. There is cephalization of the pulmonary vasculature and slight indistinctness of the interstitial markings suggestive of mild pulmonary edema. Trace bilateral pleural effusions. No confluent consolidative airspace disease. No pneumothorax. Upper mediastinal contours are within normal limits. IMPRESSION: 1. The appearance of the chest is concerning for potential congestive heart failure, as above. Electronically Signed   By: Trudie Reed M.D.   On: 05/16/2023 13:46

## 2023-06-04 NOTE — Assessment & Plan Note (Signed)
He was feeling much better with diuresis He has appointment to follow-up with cardiologist next week I will defer to them for further management

## 2023-06-04 NOTE — Assessment & Plan Note (Signed)
I checked his ferritin a year ago I will recheck ferritin again next month He will get phlebotomy if his ferritin level is greater than 250

## 2023-06-04 NOTE — Assessment & Plan Note (Signed)
 Patient prior PE was provoked He will complete a years worth of anticoagulation therapy this month  However he is at risk due to pomalyst I recommend secondary prevention with reduced dose xarelto

## 2023-06-04 NOTE — Assessment & Plan Note (Signed)
He was able to resume his treatment without difficulties He is due for Zometa next cycle He will continue treatment as scheduled He will continue antiviral treatment, calcium with vitamin D

## 2023-06-07 NOTE — Progress Notes (Unsigned)
Cardiology Office Note:    Date:  06/08/2023  ID:  Kathlyn Sacramento., DOB [birth year], MRN [id number] PCP: Ronnald Nian, MD  [facility] Providers Cardiologist:  Maisie Fus, MD Cardiology APP:  Beatrice Lecher, PA-C       Patient Profile:      (HFpEF) heart failure with preserved ejection fraction  TTE 05/17/23:  EF 60-65, no RWMA, mild LVH, NL RVSF, severe LAE, RAP 3  Hypertension  Hyperlipidemia Aortic atherosclerosis  Multiple myeloma, metastatic  Hemochromatosis  Hx of pulmonary embolism  Hx of polio         History of Present Illness:  Discussed the use of AI scribe software for clinical note transcription with the patient, who gave verbal consent to proceed.    The patient is a 72 year old who returns for post hospitalization follow up. He was admitted last month for acute heart failure with preserved ejection fraction. An echocardiogram showed an EF of 60-65%, severe left atrial enlargement, and mild LVH. The patient was diuresed with IV furosemide and started on Jardiance during the hospital stay. Chemotherapy for multiple myeloma was temporarily halted during the hospitalization but has since been resumed. He is here alone today. He reports feeling much better since discharge, with a significant reduction in weight from 261 lbs to 236 lbs. He denies any current symptoms of dyspnea, chest pain, or edema. He is currently on a low sodium diet. He reports a history of snoring and has been told by his partner that he occasionally stops breathing during sleep, suggesting possible sleep apnea. However, he has not been formally tested for this condition and expresses reluctance towards using CPAP therapy.     ROS:  See HPI    Studies Reviewed:   EKG Interpretation Date/Time:  Tuesday June 08 2023 10:38:47 EDT Ventricular Rate:  78 PR Interval:  304 QRS Duration:  88 QT Interval:  346 QTC Calculation: 394 R Axis:   10  Text Interpretation: Sinus rhythm with  marked sinus arrhythmia with 1st degree A-V block Nonspecific T wave abnormality Normal axis Confirmed by Tereso Newcomer [phone]) on 06/08/2023 11:10:58 AM   Risk Assessment/Calculations:             Physical Exam:   VS:  BP 114/68   Pulse 78   Ht 5\' 9"  (1.753 m)   Wt 240 lb 6.4 oz (109 kg)   SpO2 96%   BMI 35.50 kg/m    Wt Readings from Last 3 Encounters:  06/08/23 240 lb 6.4 oz (109 kg)  06/04/23 237 lb 9.6 oz (107.8 kg)  06/01/23 239 lb (108.4 kg)    Constitutional:      Appearance: Healthy appearance. Not in distress.  Neck:     Vascular: No JVR. JVD normal.  Pulmonary:     Breath sounds: Normal breath sounds. No wheezing. No rales.  Cardiovascular:     Normal rate. Regular rhythm.     Murmurs: There is no murmur.  Edema:    Peripheral edema absent.  Abdominal:     Palpations: Abdomen is soft.      Assessment and Plan:     Heart Failure with Preserved Ejection Fraction (HFpEF) New diagnosis. Recent hospitalization for acute HFpEF. Currently NYHA class II. Stable volume status with weight loss since hospitalization. No current symptoms of fluid overload. EF 60-65% with severe left atrial enlargement and mild LVH on echocardiogram. He would like to start cardiac rehab if possible. Recent Creatinine, potassium were normal. -  Continue Jardiance 10mg  daily and Lasix 40mg  twice daily. -Consider addition of Spironolactone if he develops hypokalemia or worsening volume status develops. -Refer for cardiac rehabilitation.  Hypertension Controlled. -Continue Losartan 100mg  daily and Furosemide 40mg  twice daily.  Hyperlipidemia LDL optimal at 69, triglycerides normal at 91 as of August 2024. -Continue Lipitor 20mg  daily.  History of Pulmonary Embolism -He remains on Xarelto 10mg  daily.  Multiple Myeloma He is now back on chemotherapy after recent hospitalization. -Continue follow-up with oncology.  Snoring Reports snoring and partner reports possible apneas. Declined  CPAP therapy. -Consider testing if patient decides to proceed.  First Degree AV Block Asymptomatic. Monitor for symptoms of progression. -Consider Zio monitor if symptoms develop.         Cardiac Rehabilitation Eligibility Assessment  The patient is ready to start cardiac rehabilitation from a cardiac standpoint.     Dispo:  Return in about 3 months (around 09/07/2023) for Routine Follow Up w/ Dr. Carolan Clines, or Tereso Newcomer, PA-C.  Signed, Tereso Newcomer, PA-C

## 2023-06-08 ENCOUNTER — Ambulatory Visit: Payer: Medicare Other | Attending: Physician Assistant | Admitting: Physician Assistant

## 2023-06-08 ENCOUNTER — Encounter: Payer: Medicare Other | Admitting: Physical Therapy

## 2023-06-08 ENCOUNTER — Telehealth: Payer: Self-pay

## 2023-06-08 ENCOUNTER — Encounter: Payer: Self-pay | Admitting: Physician Assistant

## 2023-06-08 ENCOUNTER — Other Ambulatory Visit: Payer: Self-pay

## 2023-06-08 ENCOUNTER — Telehealth: Payer: Self-pay | Admitting: Internal Medicine

## 2023-06-08 VITALS — BP 114/68 | HR 78 | Ht 69.0 in | Wt 240.4 lb

## 2023-06-08 DIAGNOSIS — I10 Essential (primary) hypertension: Secondary | ICD-10-CM | POA: Diagnosis not present

## 2023-06-08 DIAGNOSIS — E7800 Pure hypercholesterolemia, unspecified: Secondary | ICD-10-CM | POA: Diagnosis not present

## 2023-06-08 DIAGNOSIS — R0683 Snoring: Secondary | ICD-10-CM | POA: Diagnosis not present

## 2023-06-08 DIAGNOSIS — I440 Atrioventricular block, first degree: Secondary | ICD-10-CM | POA: Diagnosis not present

## 2023-06-08 DIAGNOSIS — C9000 Multiple myeloma not having achieved remission: Secondary | ICD-10-CM | POA: Insufficient documentation

## 2023-06-08 DIAGNOSIS — G252 Other specified forms of tremor: Secondary | ICD-10-CM

## 2023-06-08 DIAGNOSIS — I5032 Chronic diastolic (congestive) heart failure: Secondary | ICD-10-CM | POA: Insufficient documentation

## 2023-06-08 DIAGNOSIS — R2689 Other abnormalities of gait and mobility: Secondary | ICD-10-CM

## 2023-06-08 DIAGNOSIS — Z86711 Personal history of pulmonary embolism: Secondary | ICD-10-CM | POA: Insufficient documentation

## 2023-06-08 LAB — MULTIPLE MYELOMA PANEL, SERUM
Albumin SerPl Elph-Mcnc: 3.4 g/dL (ref 2.9–4.4)
Albumin/Glob SerPl: 1.2 (ref 0.7–1.7)
Alpha 1: 0.2 g/dL (ref 0.0–0.4)
Alpha2 Glob SerPl Elph-Mcnc: 0.9 g/dL (ref 0.4–1.0)
B-Globulin SerPl Elph-Mcnc: 0.9 g/dL (ref 0.7–1.3)
Gamma Glob SerPl Elph-Mcnc: 0.9 g/dL (ref 0.4–1.8)
Globulin, Total: 2.9 g/dL (ref 2.2–3.9)
IgA: 89 mg/dL (ref 61–437)
IgG (Immunoglobin G), Serum: 1026 mg/dL (ref 603–1613)
IgM (Immunoglobulin M), Srm: 40 mg/dL (ref 15–143)
M Protein SerPl Elph-Mcnc: 0.4 g/dL — ABNORMAL HIGH
Total Protein ELP: 6.3 g/dL (ref 6.0–8.5)

## 2023-06-08 LAB — KAPPA/LAMBDA LIGHT CHAINS
Kappa free light chain: 25.4 mg/L — ABNORMAL HIGH (ref 3.3–19.4)
Kappa, lambda light chain ratio: 1.7 — ABNORMAL HIGH (ref 0.26–1.65)
Lambda free light chains: 14.9 mg/L (ref 5.7–26.3)

## 2023-06-08 NOTE — Telephone Encounter (Signed)
Patient is calling because we put in a referral for Cardiac Rehab. Patient would like to close the referral and make PA Tereso Newcomer aware that he will do therapy elsewhere. Patient states he was seeing someone for Cardiac Rehab and will go to them instead.

## 2023-06-08 NOTE — Telephone Encounter (Signed)
Left voicemail for patient to return call to office. 

## 2023-06-08 NOTE — Patient Instructions (Signed)
Medication Instructions:  Your physician recommends that you continue on your current medications as directed. Please refer to the Current Medication list given to you today.   *If you need a refill on your cardiac medications before your next appointment, please call your pharmacy*   Lab Work: None ordered  If you have labs (blood work) drawn today and your tests are completely normal, you will receive your results only by: MyChart Message (if you have MyChart) OR A paper copy in the mail If you have any lab test that is abnormal or we need to change your treatment, we will call you to review the results.   Testing/Procedures: None ordered   You have been referred to Cardiac Rehab, they will call you.   Follow-Up: At [facility], you and your health needs are our priority.  As part of our continuing mission to provide you with exceptional heart care, we have created designated Provider Care Teams.  These Care Teams include your primary Cardiologist (physician) and Advanced Practice Providers (APPs -  Physician Assistants and Nurse Practitioners) who all work together to provide you with the care you need, when you need it.  We recommend signing up for the patient portal called "MyChart".  Sign up information is provided on this After Visit Summary.  MyChart is used to connect with patients for Virtual Visits (Telemedicine).  Patients are able to view lab/test results, encounter notes, upcoming appointments, etc.  Non-urgent messages can be sent to your provider as well.   To learn more about what you can do with MyChart, go to [email or web address].    Your next appointment:   3 month(s)  Provider:   Tereso Newcomer, PA-C         Other Instructions

## 2023-06-08 NOTE — Telephone Encounter (Signed)
Returned his call. Sent a referral for PT per his request. He did not qualify for cardiac rehab.

## 2023-06-09 ENCOUNTER — Other Ambulatory Visit: Payer: Self-pay | Admitting: Hematology and Oncology

## 2023-06-09 ENCOUNTER — Ambulatory Visit: Payer: Medicare Other | Attending: Hematology and Oncology | Admitting: Physical Therapy

## 2023-06-09 ENCOUNTER — Other Ambulatory Visit: Payer: Self-pay | Admitting: *Deleted

## 2023-06-09 DIAGNOSIS — M6281 Muscle weakness (generalized): Secondary | ICD-10-CM | POA: Diagnosis not present

## 2023-06-09 DIAGNOSIS — R2689 Other abnormalities of gait and mobility: Secondary | ICD-10-CM | POA: Diagnosis not present

## 2023-06-09 DIAGNOSIS — G252 Other specified forms of tremor: Secondary | ICD-10-CM | POA: Insufficient documentation

## 2023-06-09 DIAGNOSIS — I5032 Chronic diastolic (congestive) heart failure: Secondary | ICD-10-CM

## 2023-06-09 DIAGNOSIS — C9000 Multiple myeloma not having achieved remission: Secondary | ICD-10-CM

## 2023-06-09 DIAGNOSIS — R262 Difficulty in walking, not elsewhere classified: Secondary | ICD-10-CM | POA: Insufficient documentation

## 2023-06-09 NOTE — Therapy (Signed)
OUTPATIENT PHYSICAL THERAPY TREATMENT  Patient Name: Jesse Mcclure. MRN: [id number] DOB:09-07-1951, 72 y.o., adult Today's Date: 06/09/2023   PCP: Sharlot Gowda MD REFERRING PROVIDER: Artis Delay MD  END OF SESSION:  PT End of Session - 06/09/23 0929     Visit Number 3    Date for PT Re-Evaluation 06/24/23    Authorization Type Medicare    Progress Note Due on Visit 10    PT Start Time 0930    PT Stop Time 1010    PT Time Calculation (min) 40 min    Activity Tolerance Patient tolerated treatment well              Past Medical History:  Diagnosis Date   BPH (benign prostatic hypertrophy)    Degenerative disc disease    Diverticulosis    Hemochromatosis    HTN (hypertension)    Hyperlipidemia    Multiple myeloma (HCC)    Polio    Status post Nissen fundoplication (without gastrostomy tube) procedure    for hiatal hernia.   Past Surgical History:  Procedure Laterality Date   FOOT SURGERY     HIATAL HERNIA REPAIR     OTHER SURGICAL HISTORY     Cataract Surgery--Both eyes   SKIN BIOPSY Right 04/13/2022   squamous cell carcinoma in stiu arising in actinic keratosis   TOTAL KNEE ARTHROPLASTY Left 07/15/2021   Procedure: TOTAL KNEE ARTHROPLASTY;  Surgeon: Teryl Lucy, MD;  Location: [facility];  Service: Orthopedics;  Laterality: Left;   Undescended testes Right 10/05/1966   Patient Active Problem List   Diagnosis Date Noted   Snoring 06/08/2023   Chronic midline low back pain without sciatica 06/01/2023   Idiopathic gout 06/01/2023   (HFpEF) heart failure with preserved ejection fraction (HCC) 05/17/2023   Shortness of breath 05/16/2023   Essential tremor 05/11/2023   Balance disorder 04/23/2023   Squamous cell carcinoma in situ 04/14/2023   Melanoma in situ (HCC) 04/14/2023   History of pulmonary embolus (PE) 09/04/2022   History of basal cell cancer 05/01/2022   First degree AV block 04/22/2022   History of skin cancer 04/21/2022   S/P TKR (total  knee replacement), left 07/15/2021   Osteoarthritis of left knee 05/22/2021   Deficiency anemia 01/07/2021   ACE-inhibitor cough 12/16/2020   Tremor of both hands 10/11/2019   Peripheral neuropathy due to chemotherapy (HCC) 10/11/2019   Dry skin 10/11/2019   Insomnia disorder 09/06/2019   Loose stools 09/06/2019   S/P bone marrow transplant (HCC) 08/30/2019   Hypocalcemia 06/14/2019   Multiple myeloma without remission (HCC) 04/04/2019   Other constipation 04/04/2019   Glucose intolerance (impaired glucose tolerance) 05/20/2016   Vitamin D deficiency 05/20/2016   Chronic musculoskeletal pain 01/09/2016   History of orchiectomy, unilateral 05/20/2015   Obesity (BMI 30-39.9) 05/20/2015   Elevated PSA 05/20/2015   Arthritis 05/20/2015   Essential hypertension 03/02/2012   Hyperlipidemia 03/02/2012   Hemochromatosis, hereditary (HCC) 11/28/2008    ONSET DATE: > 1 year  REFERRING DIAG: G25.2 intention tremor; R26.89 balance disorder  THERAPY DIAG: difficulty in walking; fall risk Muscle weakness (generalized)  Difficulty in walking, not elsewhere classified  Rationale for Evaluation and Treatment: Rehabilitation  SUBJECTIVE:  SUBJECTIVE STATEMENT: Pt returns after recent hospitalization for 7 days for acute CHF.  IV Lasix 3 L of fluid.  I feel so much better.  Saw cardiologist yesterday.  Patient was released by MD to return to PT.  I need to get moving more.  I'm going to the [employer] this weekend.  Less shortness of breath now.  MRI on Friday on back. They say I have gout in my right knee (finished prednisone).   Eval: Being treated for cancer 4-5 years including oral chemo at home;  Dr wants me to walk and I use poles b/c I'm very wobbly.  Dr. says it's not the medication causing this.   Went on cruise in [place] a few months ago but I couldn't tour and stayed on the boat. No falls but several close calls: 3-4 steps garage steps into the house, stumbled going up.  If I sit a while then get up and take a few steps, will feel wobbly.  Hold onto something to pick up something from the floor. Denies loss of sensation in feet. I sleep a lot.  I'm very sedentary.   My partner thinks I need to move more.  I ride the stationary bike Tuesday/Thursday occasionally.  I used to have a trainer prior to getting sick. I get wheelchair service at airports. [place] trip in May  Goes by Jesse Mcclure  PERTINENT HISTORY:  Oncology History  Multiple myeloma without remission (HCC)  03/20/2019 Imaging    1. Tumor involving the L5 and S1 and S2 segments of the spine as described with mass effects upon the left L4, L5, S1 and more distal left sacral nerves   Left TKR HTN Back pain  PAIN:  Are you having pain? Yes  NPRS scale:  8/10  Pain location: LBP (led to cancer diagnosis)  Aggravating factors: standing in the kitchen Relieving factors: ibuprofen, oxy if it gets bad   PRECAUTIONS: Fall    WEIGHT BEARING RESTRICTIONS: No  FALLS: Has patient fallen in last 6 months? No  LIVING ENVIRONMENT: Lives with: partner Lives in: House/apartment 2 story main living downstairs Stairs: Yes: Internal: 10 steps; on right going up and External: 3 steps; on left going up Has following equipment at home: walking poles   PLOF: partner does cooking, pt does some grilling Enjoys being with friends, hanging out; doesn't go to social events  b/c I can't stand for long time 10 minutes  PATIENT GOALS: I want to walk better, stand longer; going to [place] in May for 11 days; ride bicycles there;  I'd like to not have to use wheelchair service at the airport    OBJECTIVE:    COGNITION: Overall cognitive status: Within functional limits for tasks assessed    LOWER EXTREMITY ROM:   grossly WFLs  LOWER  EXTREMITY MMT:    MMT Right Eval Left Eval  Hip flexion    Hip extension    Hip abduction 4 4  Hip adduction    Hip internal rotation    Hip external rotation    Knee flexion    Knee extension 4 4  Ankle dorsiflexion 4 4  Ankle plantarflexion 4 4  Ankle inversion    Ankle eversion    (Blank rows = not tested)           TRUNK STRENGTH:  Decreased activation of transverse abdominus muscles; abdominals 4-/5; decreased activation of lumbar multifidi; trunk extensors 4-/5  GAIT: Comments: wider base of support; decreased step length; decreased dorsiflexion  FUNCTIONAL TESTS:  5x sit to stand: 20.31 no hands TUG 12.11 sec no hands 2 min walk test 366 feet RPE 7/10 BERG balance test:  43/56 indicating a 50% risk of falls; most challenged by single limb standing or narrow base of support; decreased speed of movement, limited forward reach   TODAY'S TREATMENT:       DATE:  06/08/23 O2 sat 95% HR 65 NuStep L 1 x 7 min 2nd step hip flexor stretch O2 93% then 96% HR 74 Heel/toe raises x 10  Lateral step over 1/2 foam roll 10x right/left Forward step overs 10x right/left  Sit to stand no hands 5x; sit to stand foam + 5# KB Single arm dead lifts with single arm support 5x right/left 5# single arm overhead press 5x right/left O2 95% HR 75 RPE 2 or 3/10 Seated red power cord rows/trunk extension 10x 6 inch step tap with single arm support needed 20x  6 inch step ups 5x right/left with 2 arm support  O2 93-94% "My back is fine"                                                                                                                             DATE:  05/07/23 NuStep L 5 x 5 min Standing extension 2x 10 with back to counter  Heel raises x 10  Toe raises x 10 HS curls x 10 B Rest break RPE 4 -  moderate Seated LAQ x 10 Mini squat x 10 Hip ABD and Ext x 10 ea B Marching x 10 B Rest break Then repeated heel and toe raises and minisquats.   Back extensions x 10    7/25    PATIENT EDUCATION: Education details: Educated patient on anatomy and physiology of current symptoms, prognosis, plan of care as well as initial self care strategies to promote recovery Person educated: Patient Education method: Explanation Education comprehension: verbalized understanding  HOME EXERCISE PROGRAM: To be started  GOALS: Goals reviewed with patient? Yes  SHORT TERM GOALS: Target date: 05/27/2023   The patient will demonstrate knowledge of basic self care strategies and exercises to promote strength and mobility  Baseline: Goal status: INITIAL  2.  Able to walk 2 min 375 feet with RPE 5/10 Baseline:  Goal status: INITIAL  3.  The patient will have an improved BERG balance score to   46 /56 indicating reduced risk of falls  Baseline:  Goal status: INITIAL  4.  Improved LE strength and balance  indicated by improved 5x STS to 16 sec Baseline:  Goal status: INITIAL  5.  Set DGI for MiniBESTest goal Baseline:  Goal status: INITIAL    LONG TERM GOALS: Target date: 06/24/2023   The patient will be independent in a safe self progression of a home exercise program/gym program to promote further recovery of function  Baseline:  Goal status: INITIAL  2.  The patient will have improved gait stamina and speed needed to ambulate 600 feet in 6 minutes with RPE of 6/10  Baseline:  Goal status: INITIAL  3.  The patient will have an improved BERG balance score to  48  /56 indicating reduced risk of falls  Baseline:  Goal status: INITIAL  4.  The patient will have improved Timed Up and Go (TUG) time to    <12   sec indicating improved gait speed and LE strength  Baseline:  Goal status: INITIAL  5.  The patient will have improved hip and knee strength to at least 4+/5 needed for standing >10 min for washing dishes Baseline:  Goal status: INITIAL   ASSESSMENT:  CLINICAL IMPRESSION: The patient returns to PT after a 7  day hospitalization for acute CHF.  He reports feeling much better after 3L of fluid was lost during this time.  He is able to increase exercise intensity today compared to last visit with rating of perceived exertion staying low and O2 and HR remaining Mcclure.  His back pain, although rated high on arrival, "is good" during and following session.  Therapist monitoring response throughout treatment session.   OBJECTIVE IMPAIRMENTS: cardiopulmonary status limiting activity, decreased activity tolerance, decreased balance, decreased endurance, decreased mobility, difficulty walking, decreased strength, impaired perceived functional ability, and pain.   ACTIVITY LIMITATIONS: carrying, lifting, bending, standing, squatting, and locomotion level  PARTICIPATION LIMITATIONS: meal prep, cleaning, interpersonal relationship, shopping, and community activity  PERSONAL FACTORS: Fitness, Time since onset of injury/illness/exacerbation, and 3+ comorbidities: multiple myeloma, HTN, DDD lumbar spine  are also affecting patient's functional outcome.   REHAB POTENTIAL: Good  CLINICAL DECISION MAKING: Evolving/moderate complexity  EVALUATION COMPLEXITY: Moderate  PLAN:  PT FREQUENCY: 2x/week  PT DURATION: 8 weeks  PLANNED INTERVENTIONS: Therapeutic exercises, Therapeutic activity, Neuromuscular re-education, Balance training, Gait training, Patient/Family education, Self Care, Stair training, Aquatic Therapy, Cryotherapy, Moist heat, Manual therapy, and Re-evaluation  PLAN FOR NEXT SESSION: check MRI results (Friday); basic balance/general strength HEP: counter ex's (heel raises, hip abduction, hip extension, marching) and sit to stands;  Dynamic Gait Index or MiniBESTest; Nu-Step for conditioning; gait in clinic to build endurance; monitor rating of perceived exertion (RPE) secondary to fatigue related to cancer treatment  Lavinia Sharps, PT 06/09/23 10:20 AM Phone: [phone] Fax:  [phone]

## 2023-06-09 NOTE — Telephone Encounter (Signed)
Patient states he is going to [facility] special rehab center so he does not need other referral.

## 2023-06-09 NOTE — Progress Notes (Signed)
Amb refv

## 2023-06-10 ENCOUNTER — Encounter: Payer: Medicare Other | Admitting: Physical Therapy

## 2023-06-10 NOTE — Addendum Note (Signed)
Addended by: Burnetta Sabin on: 06/10/2023 11:03 AM   Modules accepted: Orders

## 2023-06-11 ENCOUNTER — Ambulatory Visit (HOSPITAL_COMMUNITY)
Admission: RE | Admit: 2023-06-11 | Discharge: 2023-06-11 | Disposition: A | Discharge status: Home / Self Care | Payer: Medicare Other | Source: Ambulatory Visit | Attending: Internal Medicine | Admitting: Internal Medicine

## 2023-06-11 DIAGNOSIS — M48061 Spinal stenosis, lumbar region without neurogenic claudication: Secondary | ICD-10-CM | POA: Diagnosis not present

## 2023-06-11 DIAGNOSIS — D492 Neoplasm of unspecified behavior of bone, soft tissue, and skin: Secondary | ICD-10-CM | POA: Insufficient documentation

## 2023-06-11 DIAGNOSIS — M4807 Spinal stenosis, lumbosacral region: Secondary | ICD-10-CM | POA: Diagnosis not present

## 2023-06-11 DIAGNOSIS — C9000 Multiple myeloma not having achieved remission: Secondary | ICD-10-CM | POA: Diagnosis not present

## 2023-06-11 DIAGNOSIS — M47816 Spondylosis without myelopathy or radiculopathy, lumbar region: Secondary | ICD-10-CM | POA: Diagnosis not present

## 2023-06-11 MED ORDER — GADOBUTROL 1 MMOL/ML IV SOLN
10.0000 mL | Freq: Once | INTRAVENOUS | Status: AC | PRN
  Administered 2023-06-11: 10 mL via INTRAVENOUS

## 2023-06-15 ENCOUNTER — Ambulatory Visit: Payer: Medicare Other | Admitting: Internal Medicine

## 2023-06-15 ENCOUNTER — Encounter: Payer: Medicare Other | Admitting: Physical Therapy

## 2023-06-15 ENCOUNTER — Ambulatory Visit: Payer: Medicare Other

## 2023-06-16 ENCOUNTER — Other Ambulatory Visit: Payer: Self-pay | Admitting: Hematology and Oncology

## 2023-06-16 ENCOUNTER — Telehealth: Payer: Self-pay | Admitting: Hematology and Oncology

## 2023-06-16 MED ORDER — OXYCODONE HCL 5 MG PO TABS: 5.0000 mg | ORAL_TABLET | ORAL | 0 refills | Status: DC | PRN

## 2023-06-16 NOTE — Telephone Encounter (Signed)
Pt called for medication refill for oxycodone 5 mg Q4 PRN. Message sent to Dr. Bertis Ruddy.

## 2023-06-17 ENCOUNTER — Inpatient Hospital Stay: Payer: Medicare Other | Attending: Hematology and Oncology | Admitting: Internal Medicine

## 2023-06-17 ENCOUNTER — Other Ambulatory Visit: Payer: Self-pay | Admitting: Radiation Therapy

## 2023-06-17 ENCOUNTER — Encounter: Payer: Medicare Other | Admitting: Physical Therapy

## 2023-06-17 VITALS — BP 108/65 | HR 69 | Temp 97.9°F | Resp 18 | Wt 241.0 lb

## 2023-06-17 DIAGNOSIS — Z79899 Other long term (current) drug therapy: Secondary | ICD-10-CM | POA: Diagnosis not present

## 2023-06-17 DIAGNOSIS — T451X5A Adverse effect of antineoplastic and immunosuppressive drugs, initial encounter: Secondary | ICD-10-CM

## 2023-06-17 DIAGNOSIS — G620 Drug-induced polyneuropathy: Secondary | ICD-10-CM | POA: Diagnosis not present

## 2023-06-17 DIAGNOSIS — C9000 Multiple myeloma not having achieved remission: Secondary | ICD-10-CM | POA: Diagnosis not present

## 2023-06-17 DIAGNOSIS — G250 Essential tremor: Secondary | ICD-10-CM

## 2023-06-17 NOTE — Progress Notes (Signed)
[facility] [address]  [place], [place] [address] [phone]   Interval Evaluation  Date of Service: 06/17/23 Patient Name: Jesse Mcclure. Patient MRN: [id number] Patient DOB: [birth year] Provider: Henreitta Leber, MD  Identifying Statement:  Jesse Graetz. is a 72 y.o. adult with Essential tremor  Peripheral neuropathy due to chemotherapy [facility])   Primary Cancer:  Oncologic History: Oncology History  Multiple myeloma without remission (HCC)  03/20/2019 Imaging   1. Tumor involving the L5 and S1 and S2 segments of the spine as described with mass effects upon the left L4, L5, S1 and more distal left sacral nerves as described above. 2. Does the patient have a history of malignancy? 3. Multiple plasmacytomas and metastatic disease could give this appearance   03/27/2019 Pathology Results   Soft Tissue Needle Core Biopsy, sacral mass - PLASMABLASTIC NEOPLASM. - SEE COMMENT. Microscopic Comment The sections show needle core biopsy fragments of soft tissue densely infiltrated by a relatively monomorphic infiltrate of atypical plasmacytoid cells characterized by vesicular or partially clumped chromatin and prominent nucleoli. This is associated with scattered mitosis. A battery of immunohistochemical stains was performed and show that the atypical plasmacytoid cells are positive for CD138, CD43 and cytoplasmic kappa. There is also weak positivity for CD56 and partial variable positivity for cyclin D1. The atypical plasmacytoid cells are negative for cytoplasmic lambda, CD10, PAX5, CD79a, CD20, CD3, CD5, CD34, EBV (ISH) and mostly negative for LCA. The overall morphologic and histologic features are most compatible with a plasmablastic neoplasm. The differential diagnosis includes plasmablastic lymphoma and plasmablastic plasmacytoma/myeloma. Based on the overall phenotypic features and the clinical setting, plasmacytoma/myeloma is  favored. Clinical correlation and hematologic evaluation is recommended   03/27/2019 Procedure   Technically successful CT-guided biopsy of sacral soft tissue mass.   04/04/2019 Initial Diagnosis   Multiple myeloma without remission (HCC)   04/11/2019 PET scan   IMPRESSION: 1. Previously noted expansile lesions involving the L5 vertebra and sacrum are again noted and exhibit intense FDG uptake compatible with metabolically active disease. A third focus of increased uptake without corresponding CT abnormality is noted within the intertrochanteric portions of the proximal right femur. 2. Small lucent lesion within the T3 vertebra is noted without corresponding FDG uptake. 3. Asymmetric left bladder wall thickening, etiology indeterminate. 4. Aortic Atherosclerosis (ICD10-I70.0). Lad coronary artery calcification.   04/12/2019 Cancer Staging   Staging form: Plasma Cell Myeloma and Plasma Cell Disorders, AJCC 8th Edition - Clinical stage from 04/12/2019: Beta-2-microglobulin (mg/L): 2, Albumin (g/dL): 3.6, ISS: Stage I, High-risk cytogenetics: Unknown, LDH: Unknown - Signed by Artis Delay, MD on 04/12/2019   04/14/2019 Bone Marrow Biopsy   Bone Marrow, Aspirate,Biopsy, and Clot, right iliac bone - PLASMA CELL MYELOMA.   04/17/2019 - 07/11/2019 Chemotherapy   The patient had bortezemib, revlimid and dexamethasone for chemotherapy treatment.     08/17/2019 Bone Marrow Transplant   He received high dose melphalan followed by autologous stem cell transplant at [facility]   11/22/2019 Bone Marrow Biopsy   BONE MARROW biopsy at [facility]:       Mild monoclonal plasmacytosis (approximately 2%), kappa light chain restricted.    11/22/2019 PET scan   PET CT at [facility] 1.  Similar size and appearance of mildly hypermetabolic lytic lesion in the L5 vertebral body and posterior elements.  2.  Otherwise, no substantial FDG uptake compared to background bone marrow in the additional osseous lucent  lesions.  12/20/2019 - 05/09/2020 Radiation Therapy   Radiation Treatment Dates: 12/20/2019 through 01/08/2020 Site Technique Total Dose (Gy) Dose per Fx (Gy) Completed Fx Beam Energies  Lumbar Spine: Spine 3D 35/35 2.5 14/14 15X        02/29/2020 PET scan   1. Similar size and appearance of expansile lytic lesion in the L5 vertebral body and posterior elements with minimal FDG activity.  2. Otherwise, no additional osseous lucent lesions with FDG uptake above background bone marrow   05/31/2020 -  Chemotherapy   The patient had Revlimid for chemotherapy treatment.     11/25/2021 - 05/11/2022 Chemotherapy   Patient is on Treatment Plan : MYELOMA RELAPSED / REFRACTORY Daratumumab SQ + Bortezomib + Dexamethasone (DaraVd) q21d / Daratumumab SQ q28d      11/25/2021 - 10/29/2022 Chemotherapy   Patient is on Treatment Plan : MYELOMA RELAPSED / REFRACTORY Daratumumab SQ + Bortezomib + Dexamethasone (DaraVd) q21d / Daratumumab SQ q28d       CNS Oncologic History 12/18/19: Completes radiation therapy L5-S2 ([place])  Interval History: Jesse Mcclure. Presents today for follow up after recent MRI of the lumbar spine.  He describes some improvement in his tremor following his hospitalization.  He is not dosing the primidone at this time.  Continues to have midline low back pain.  Walking has improved with PT since weight loss, diuresis from his admission.  H+P (05/11/23) Patient presents to review tremor, balance issues.  He describes 6 months history of tremor with activity.  It affects both hands, causes a "fast shake".  He has difficulty using a fork and other utensils, and is using a bib to eat.  Sometimes objects are difficult to grasp.  Sister has a similar tremor, parents died relatively young and did not have tremor.  No involvement of feet, head or voice.  It might improve with social drinking.  He also describes impairment in balance over the same time period.  He was limited in walking on his  most recent vacation, needed to stay on the cruise rather than go on outings.  No notable falls, still does walk independently around the home.  Recently started PT sessions.  Medications: Current Outpatient Medications on File Prior to Visit  Medication Sig Dispense Refill   acyclovir (ZOVIRAX) 400 MG tablet TAKE 1 TABLET(400 MG) BY MOUTH DAILY 90 tablet 1   atorvastatin (LIPITOR) 20 MG tablet Take 1 tablet (20 mg total) by mouth daily. TAKE 1 TABLET(20 MG) BY MOUTH DAILY 90 tablet 0   calcium carbonate (TUMS - DOSED IN MG ELEMENTAL CALCIUM) 500 MG chewable tablet Chew 1 tablet by mouth 2 (two) times daily.     Cholecalciferol (VITAMIN D) 50 MCG (2000 UT) tablet Take 2,000 Units by mouth daily.     diclofenac Sodium (VOLTAREN) 1 % GEL Apply 2 g topically 4 (four) times daily. Apply to knee 50 g 2   empagliflozin (JARDIANCE) 10 MG TABS tablet Take 1 tablet (10 mg total) by mouth daily. 30 tablet 3   folic acid (FOLVITE) 400 MCG tablet Take 400 mcg by mouth daily.     furosemide (LASIX) 40 MG tablet Take 1 tablet (40 mg total) by mouth 2 (two) times daily. 60 tablet 3   ixazomib citrate (NINLARO) 3 MG capsule Take 1 capsule (3 mg) by mouth weekly, 3 weeks on, 1 week off, repeat every 4 weeks. Take on an empty stomach 1hr before or 2hr after meals. 3 capsule 11   losartan (COZAAR) 100  MG tablet Take 1 tablet (100 mg total) by mouth daily. 30 tablet 11   Multiple Vitamin (MULTI-VITAMIN) tablet Take 1 tablet by mouth daily.     oxyCODONE (OXY IR/ROXICODONE) 5 MG immediate release tablet Take 1 tablet (5 mg total) by mouth every 4 (four) hours as needed for severe pain. 60 tablet 0   Polyethyl Glycol-Propyl Glycol (SYSTANE OP) Place 1 drop into both eyes daily as needed (dry eyes).     pomalidomide (POMALYST) 3 MG capsule Take 1 capsule (3mg ) by mouth daily for 21 days, rest 7 days, repeat every 28 days. 21 capsule 0   primidone (MYSOLINE) 50 MG tablet Take 1 tablet (50 mg total) by mouth at bedtime.  30 tablet 2   prochlorperazine (COMPAZINE) 10 MG tablet TAKE 1 TABLET(10 MG) BY MOUTH EVERY 6 HOURS AS NEEDED FOR NAUSEA OR VOMITING 60 tablet 1   rivaroxaban (XARELTO) 10 MG TABS tablet Take 1 tablet (10 mg total) by mouth daily with supper. 90 tablet 1   vitamin B-12 (CYANOCOBALAMIN) 1000 MCG tablet Take 1,000 mcg by mouth daily.     No current facility-administered medications on file prior to visit.    Allergies: No Known Allergies Past Medical History:  Past Medical History:  Diagnosis Date   BPH (benign prostatic hypertrophy)    Degenerative disc disease    Diverticulosis    Hemochromatosis    HTN (hypertension)    Hyperlipidemia    Multiple myeloma (HCC)    Polio    Status post Nissen fundoplication (without gastrostomy tube) procedure    for hiatal hernia.   Past Surgical History:  Past Surgical History:  Procedure Laterality Date   FOOT SURGERY     HIATAL HERNIA REPAIR     OTHER SURGICAL HISTORY     Cataract Surgery--Both eyes   SKIN BIOPSY Right 04/13/2022   squamous cell carcinoma in stiu arising in actinic keratosis   TOTAL KNEE ARTHROPLASTY Left 07/15/2021   Procedure: TOTAL KNEE ARTHROPLASTY;  Surgeon: Teryl Lucy, MD;  Location: [facility];  Service: Orthopedics;  Laterality: Left;   Undescended testes Right 10/05/1966   Social History:  Social History   Socioeconomic History   Marital status: Single    Spouse name: Not on file   Number of children: Not on file   Years of education: Not on file   Highest education level: Not on file  Occupational History   Not on file  Tobacco Use   Smoking status: Never   Smokeless tobacco: Never  Vaping Use   Vaping status: Never Used  Substance and Sexual Activity   Alcohol use: Not Currently    Alcohol/week: 4.0 standard drinks of alcohol    Types: 4 drink(s) per week    Comment: occasionally   Drug use: No   Sexual activity: Yes  Other Topics Concern   Not on file  Social History Narrative   Not on file    Social Determinants of Health   Financial Resource Strain: Low Risk  (12/29/2022)   Overall Financial Resource Strain (CARDIA)    Difficulty of Paying Living Expenses: Not hard at all  Food Insecurity: No Food Insecurity (05/24/2023)   Hunger Vital Sign    Worried About Running Out of Food in the Last Year: Never true    Ran Out of Food in the Last Year: Never true  Transportation Needs: No Transportation Needs (05/24/2023)   PRAPARE - Transportation    Lack of Transportation (Medical): No    Lack  of Transportation (Non-Medical): No  Physical Activity: Insufficiently Active (12/29/2022)   Exercise Vital Sign    Days of Exercise per Week: 2 days    Minutes of Exercise per Session: 20 min  Stress: No Stress Concern Present (12/29/2022)   [employer] of Occupational Health - Occupational Stress Questionnaire    Feeling of Stress : Not at all  Social Connections: Not on file  Intimate Partner Violence: Not At Risk (05/16/2023)   Humiliation, Afraid, Rape, and Kick questionnaire    Fear of Current or Ex-Partner: No    Emotionally Abused: No    Physically Abused: No    Sexually Abused: No   Family History:  Family History  Problem Relation Age of Onset   Breast cancer Mother    Arrhythmia Father    Heart failure Father    Breast cancer Sister     Review of Systems: Constitutional: Doesn't report fevers, chills or abnormal weight loss Eyes: Doesn't report blurriness of vision Ears, nose, mouth, throat, and face: Doesn't report sore throat Respiratory: Doesn't report cough, dyspnea or wheezes Cardiovascular: Doesn't report palpitation, chest discomfort  Gastrointestinal:  Doesn't report nausea, constipation, diarrhea GU: Doesn't report incontinence Skin: Doesn't report skin rashes Neurological: Per HPI Musculoskeletal: Doesn't report joint pain Behavioral/Psych: Doesn't report anxiety  Physical Exam: Vitals:   06/17/23 1400  BP: 108/65  Pulse: 69  Resp: 18  Temp:  97.9 F (36.6 C)  SpO2: 97%    KPS: 80. General: Alert, cooperative, pleasant, in no acute distress Head: Normal EENT: No conjunctival injection or scleral icterus.  Lungs: Resp effort normal Cardiac: Regular rate Abdomen: Non-distended abdomen Skin: No rashes cyanosis or petechiae. Extremities: No clubbing or edema  Neurologic Exam: Mental Status: Awake, alert, attentive to examiner. Oriented to self and environment. Language is fluent with intact comprehension.  Cranial Nerves: Visual acuity is grossly normal. Visual fields are full. Extra-ocular movements intact. No ptosis. Face is symmetric Motor: Tone and bulk are normal. Symmetric, high frequency action tremor in hands only. Reflexes are symmetric, no pathologic reflexes present.  Sensory: Stocking impairment Gait: Sensory dystaxia   Labs: I have reviewed the data as listed    Component Value Date/Time   NA 139 06/01/2023 1115   NA 139 04/05/2014 0849   K 3.6 06/01/2023 1115   K 4.4 04/05/2014 0849   CL 97 06/01/2023 1115   CO2 26 06/01/2023 1115   CO2 23 04/05/2014 0849   GLUCOSE 93 06/01/2023 1115   GLUCOSE 102 (H) 05/23/2023 0412   GLUCOSE 109 04/05/2014 0849   BUN 26 06/01/2023 1115   BUN 16.8 04/05/2014 0849   CREATININE 0.98 06/01/2023 1115   CREATININE 0.80 10/29/2022 1046   CREATININE 0.91 07/29/2017 1004   CREATININE 0.9 04/05/2014 0849   CALCIUM 9.0 06/01/2023 1115   CALCIUM 8.7 04/05/2014 0849   PROT 6.3 06/01/2023 1115   PROT 6.7 04/05/2014 0849   ALBUMIN 4.1 06/01/2023 1115   ALBUMIN 3.8 04/05/2014 0849   AST 25 06/01/2023 1115   AST 24 10/29/2022 1046   AST 26 04/05/2014 0849   ALT 45 (H) 06/01/2023 1115   ALT 31 10/29/2022 1046   ALT 30 04/05/2014 0849   ALKPHOS 76 06/01/2023 1115   ALKPHOS 75 04/05/2014 0849   BILITOT 0.9 06/01/2023 1115   BILITOT 0.9 10/29/2022 1046   BILITOT 0.71 04/05/2014 0849   GFRNONAA >60 05/23/2023 0412   GFRNONAA >60 10/29/2022 1046   GFRAA >60 06/14/2020  0830   Lab Results  Component Value Date   WBC 7.4 06/04/2023   NEUTROABS 3.7 06/04/2023   HGB 15.6 06/04/2023   HCT 45.5 06/04/2023   MCV 100.2 (H) 06/04/2023   PLT 245 06/04/2023    Imaging:  [facility] Clinician Interpretation: I have personally reviewed the CNS images as listed.  My interpretation, in the context of the patient's clinical presentation, is progressive disease new L3 enhancing lesion c/w likely vertebral metastasis  VAS [place] LOWER EXTREMITY VENOUS (DVT)  Result Date: 05/20/2023  Lower Venous DVT Study Patient Name:  Jesse Mcclure.  Date of Exam:   05/19/2023 Medical Rec #: [id number]            Accession #:    [id number] Date of Birth: [birth year]            Patient Gender: M Patient Age:   72 years Exam Location:  [facility] Procedure:      VAS [place] LOWER EXTREMITY VENOUS (DVT) Referring Phys: PRANAV PATEL --------------------------------------------------------------------------------  Indications: Edema.  Risk Factors: None identified. Comparison Study: No prior studies. Performing Technologist: Chanda Busing RVT  Examination Guidelines: A complete evaluation includes B-mode imaging, spectral Doppler, color Doppler, and power Doppler as needed of all accessible portions of each vessel. Bilateral testing is considered an integral part of a complete examination. Limited examinations for reoccurring indications may be performed as noted. The reflux portion of the exam is performed with the patient in reverse Trendelenburg.  +---------+---------------+---------+-----------+----------+--------------+ RIGHT    CompressibilityPhasicitySpontaneityPropertiesThrombus Aging +---------+---------------+---------+-----------+----------+--------------+ CFV      Full           Yes      Yes                                 +---------+---------------+---------+-----------+----------+--------------+ SFJ      Full                                                         +---------+---------------+---------+-----------+----------+--------------+ FV Prox  Full                                                        +---------+---------------+---------+-----------+----------+--------------+ FV Mid   Full                                                        +---------+---------------+---------+-----------+----------+--------------+ FV DistalFull                                                        +---------+---------------+---------+-----------+----------+--------------+ PFV      Full                                                        +---------+---------------+---------+-----------+----------+--------------+  POP      Full           Yes      Yes                                 +---------+---------------+---------+-----------+----------+--------------+ PTV      Full                                                        +---------+---------------+---------+-----------+----------+--------------+ PERO     Full                                                        +---------+---------------+---------+-----------+----------+--------------+   +---------+---------------+---------+-----------+----------+--------------+ LEFT     CompressibilityPhasicitySpontaneityPropertiesThrombus Aging +---------+---------------+---------+-----------+----------+--------------+ CFV      Full           Yes      Yes                                 +---------+---------------+---------+-----------+----------+--------------+ SFJ      Full                                                        +---------+---------------+---------+-----------+----------+--------------+ FV Prox  Full                                                        +---------+---------------+---------+-----------+----------+--------------+ FV Mid   Full                                                         +---------+---------------+---------+-----------+----------+--------------+ FV DistalFull                                                        +---------+---------------+---------+-----------+----------+--------------+ PFV      Full                                                        +---------+---------------+---------+-----------+----------+--------------+ POP      Full           Yes      Yes                                 +---------+---------------+---------+-----------+----------+--------------+  PTV      Full                                                        +---------+---------------+---------+-----------+----------+--------------+ PERO     Full                                                        +---------+---------------+---------+-----------+----------+--------------+     Summary: RIGHT: - There is no evidence of deep vein thrombosis in the lower extremity.  - No cystic structure found in the popliteal fossa.  LEFT: - There is no evidence of deep vein thrombosis in the lower extremity.  - No cystic structure found in the popliteal fossa.  *See table(s) above for measurements and observations. Electronically signed by Sherald Hess MD on 05/20/2023 at 9:45:20 AM.    Final    DG Knee Complete 4 Views Right  Result Date: 05/18/2023 CLINICAL DATA:  Knee swelling EXAM: RIGHT KNEE - COMPLETE 4 VIEW COMPARISON:  None Available. FINDINGS: Osteopenia. Small osteophytes of the patellofemoral joint and lateral compartment. There is joint space loss of the patellofemoral joint. There is a well corticated density above the patellofemoral joint and slightly lateral. Please correlate for any previous remote injury or other soft tissue ossification. Otherwise no acute fracture or dislocation. Small joint effusion. IMPRESSION: Degenerative changes.  Greatest of the patellofemoral joint. Small joint effusion. Well corticated ossific density seen above the patella on  lateral view. This could be the sequela of old trauma. Please correlate for any prior imaging. If there is further concern additional evaluation such as CT as clinically appropriate or MRI. Electronically Signed   By: Karen Kays M.D.   On: 05/18/2023 18:57   Assessment/Plan Essential tremor  Peripheral neuropathy due to chemotherapy [facility])  Jesse Mcclure. Presents with clinical syndrome which may localize to peripheral nerves and/or lumbosacral spine. 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  In addition, he has some peripheral neuropathy on exam, likely from velcade exposure.    Lumbar spine MRI demonstrated new focus of enhancement at L3, possibly c/w plasmacytoma.  Prior study was from 2022.    We will discuss this in CNS tumor board this week, would appreciate neuroradiology insight here.  Official read is pending.  Could potentially treat with SRS given concurrent localizing pain.  May resume primidone 50mg  HS or reduce to 25mg  HS if tremor recurs.  We spent twenty additional minutes teaching regarding the natural history, biology, and historical experience in the treatment of neurologic complications of cancer.   We appreciate the opportunity to participate in the care of Jesse Mcclure..  We will touch base with him following tumor board discussion.  All questions were answered. The patient knows to call the clinic with any problems, questions or concerns. No barriers to learning were detected.  The total time spent in the encounter was 40 minutes and more than 50% was on counseling and review of test results   Henreitta Leber, MD Medical Director of Neuro-Oncology [facility] 06/17/23 1:58 PM

## 2023-06-21 ENCOUNTER — Other Ambulatory Visit: Payer: Self-pay | Admitting: Internal Medicine

## 2023-06-21 ENCOUNTER — Inpatient Hospital Stay: Payer: Medicare Other

## 2023-06-21 DIAGNOSIS — D492 Neoplasm of unspecified behavior of bone, soft tissue, and skin: Secondary | ICD-10-CM

## 2023-06-22 ENCOUNTER — Encounter: Payer: Self-pay | Admitting: Physical Therapy

## 2023-06-22 ENCOUNTER — Encounter: Payer: Medicare Other | Admitting: Physical Therapy

## 2023-06-22 ENCOUNTER — Ambulatory Visit: Payer: Medicare Other | Admitting: Physical Therapy

## 2023-06-22 ENCOUNTER — Other Ambulatory Visit: Payer: Self-pay

## 2023-06-22 DIAGNOSIS — M6281 Muscle weakness (generalized): Secondary | ICD-10-CM

## 2023-06-22 DIAGNOSIS — R262 Difficulty in walking, not elsewhere classified: Secondary | ICD-10-CM | POA: Diagnosis not present

## 2023-06-22 DIAGNOSIS — R2689 Other abnormalities of gait and mobility: Secondary | ICD-10-CM | POA: Diagnosis not present

## 2023-06-22 DIAGNOSIS — C9000 Multiple myeloma not having achieved remission: Secondary | ICD-10-CM

## 2023-06-22 DIAGNOSIS — G252 Other specified forms of tremor: Secondary | ICD-10-CM | POA: Diagnosis not present

## 2023-06-22 MED ORDER — POMALIDOMIDE 3 MG PO CAPS: ORAL_CAPSULE | ORAL | 0 refills | Status: DC

## 2023-06-22 NOTE — Therapy (Signed)
OUTPATIENT PHYSICAL THERAPY TREATMENT  Patient Name: Jesse Mcclure. MRN: [id number] DOB:1951/02/17, 72 y.o., adult Today's Date: 06/22/2023   PCP: Jesse Gowda MD REFERRING PROVIDER: Artis Delay MD  END OF SESSION:  PT End of Session - 06/22/23 1622     Visit Number 4    Date for PT Re-Evaluation 06/24/23    Authorization Type Medicare    Progress Note Due on Visit 10    PT Start Time 1534    PT Stop Time 1615    PT Time Calculation (min) 41 min    Activity Tolerance Patient tolerated treatment well    Behavior During Therapy WFL for tasks assessed/performed               Past Medical History:  Diagnosis Date   BPH (benign prostatic hypertrophy)    Degenerative disc disease    Diverticulosis    Hemochromatosis    HTN (hypertension)    Hyperlipidemia    Multiple myeloma (HCC)    Polio    Status post Nissen fundoplication (without gastrostomy tube) procedure    for hiatal hernia.   Past Surgical History:  Procedure Laterality Date   FOOT SURGERY     HIATAL HERNIA REPAIR     OTHER SURGICAL HISTORY     Cataract Surgery--Both eyes   SKIN BIOPSY Right 04/13/2022   squamous cell carcinoma in stiu arising in actinic keratosis   TOTAL KNEE ARTHROPLASTY Left 07/15/2021   Procedure: TOTAL KNEE ARTHROPLASTY;  Surgeon: Jesse Lucy, MD;  Location: [facility];  Service: Orthopedics;  Laterality: Left;   Undescended testes Right 10/05/1966   Patient Active Problem List   Diagnosis Date Noted   Snoring 06/08/2023   Chronic midline low back pain without sciatica 06/01/2023   Idiopathic gout 06/01/2023   (HFpEF) heart failure with preserved ejection fraction (HCC) 05/17/2023   Shortness of breath 05/16/2023   Essential tremor 05/11/2023   Balance disorder 04/23/2023   Squamous cell carcinoma in situ 04/14/2023   Melanoma in situ (HCC) 04/14/2023   History of pulmonary embolus (PE) 09/04/2022   History of basal cell cancer 05/01/2022   First degree AV block  04/22/2022   History of skin cancer 04/21/2022   S/P TKR (total knee replacement), left 07/15/2021   Osteoarthritis of left knee 05/22/2021   Deficiency anemia 01/07/2021   ACE-inhibitor cough 12/16/2020   Tremor of both hands 10/11/2019   Peripheral neuropathy due to chemotherapy (HCC) 10/11/2019   Dry skin 10/11/2019   Insomnia disorder 09/06/2019   Loose stools 09/06/2019   S/P bone marrow transplant (HCC) 08/30/2019   Hypocalcemia 06/14/2019   Multiple myeloma without remission (HCC) 04/04/2019   Other constipation 04/04/2019   Glucose intolerance (impaired glucose tolerance) 05/20/2016   Vitamin D deficiency 05/20/2016   Chronic musculoskeletal pain 01/09/2016   History of orchiectomy, unilateral 05/20/2015   Obesity (BMI 30-39.9) 05/20/2015   Elevated PSA 05/20/2015   Arthritis 05/20/2015   Essential hypertension 03/02/2012   Hyperlipidemia 03/02/2012   Hemochromatosis, hereditary (HCC) 11/28/2008    ONSET DATE: > 1 year  REFERRING DIAG: G25.2 intention tremor; R26.89 balance disorder  THERAPY DIAG: difficulty in walking; fall risk Muscle weakness (generalized)  Difficulty in walking, not elsewhere classified  Balance disorder  Rationale for Evaluation and Treatment: Rehabilitation  SUBJECTIVE:  SUBJECTIVE STATEMENT: Patient is doing well today. He was not overly fatigued after last treatment session. Patient had an MRI last week but has not received the results yet.  Eval: Being treated for cancer 4-5 years including oral chemo at home;  Dr wants me to walk and I use poles b/c I'm very wobbly.  Dr. says it's not the medication causing this.  Went on cruise in [place] a few months ago but I couldn't tour and stayed on the boat. No falls but several close calls: 3-4 steps garage steps  into the house, stumbled going up.  If I sit a while then get up and take a few steps, will feel wobbly.  Hold onto something to pick up something from the floor. Denies loss of sensation in feet. I sleep a lot.  I'm very sedentary.   My partner thinks I need to move more.  I ride the stationary bike Tuesday/Thursday occasionally.  I used to have a trainer prior to getting sick. I get wheelchair service at airports. [place] trip in May  Goes by [name]  PERTINENT HISTORY:  Oncology History  Multiple myeloma without remission (HCC)  03/20/2019 Imaging    1. Tumor involving the L5 and S1 and S2 segments of the spine as described with mass effects upon the left L4, L5, S1 and more distal left sacral nerves   Left TKR HTN Back pain  PAIN:  Are you having pain? Yes  NPRS scale:  8/10  Pain location: LBP (led to cancer diagnosis)  Aggravating factors: standing in the kitchen Relieving factors: ibuprofen, oxy if it gets bad   PRECAUTIONS: Fall    WEIGHT BEARING RESTRICTIONS: No  FALLS: Has patient fallen in last 6 months? No  LIVING ENVIRONMENT: Lives with: partner Lives in: House/apartment 2 story main living downstairs Stairs: Yes: Internal: 10 steps; on right going up and External: 3 steps; on left going up Has following equipment at home: walking poles   PLOF: partner does cooking, pt does some grilling Enjoys being with friends, hanging out; doesn't go to social events  b/c I can't stand for long time 10 minutes  PATIENT GOALS: I want to walk better, stand longer; going to [employer] in May for 11 days; ride bicycles there;  I'd like to not have to use wheelchair service at the airport    OBJECTIVE:    COGNITION: Overall cognitive status: Within functional limits for tasks assessed    LOWER EXTREMITY ROM:   grossly WFLs  LOWER EXTREMITY MMT:    MMT Right Eval Left Eval  Hip flexion    Hip extension    Hip abduction 4 4  Hip adduction    Hip internal rotation     Hip external rotation    Knee flexion    Knee extension 4 4  Ankle dorsiflexion 4 4  Ankle plantarflexion 4 4  Ankle inversion    Ankle eversion    (Blank rows = not tested)           TRUNK STRENGTH:  Decreased activation of transverse abdominus muscles; abdominals 4-/5; decreased activation of lumbar multifidi; trunk extensors 4-/5                                  GAIT: Comments: wider base of support; decreased step length; decreased dorsiflexion  FUNCTIONAL TESTS:  5x sit to stand: 20.31 no hands TUG 12.11 sec no hands  2 min walk test 366 feet RPE 7/10 BERG balance test:  43/56 indicating a 50% risk of falls; most challenged by single limb standing or narrow base of support; decreased speed of movement, limited forward reach   TODAY'S TREATMENT:       DATE:  06/22/23 NuStep L 1 x 7 min O2 94%  HR 101 Seated shoulder press 5# 2 x 10 2 MWT: 312ft; patient had to sit because of back pain O2 88% then 93% within 1-9min Heel/toe raises x 10  LAQs x 10 Sit to Stand no hands x 6 Standing Biceps Curls 2 x 10  6 inch step tap with single arm support needed 20x Standing Power Cord Row 2 x 10 Standing Power Cord ER 2 x 10 Hurdles Forwards and Side ways O2 97% HR 102 Seated Punches 4# 2 x 10  06/08/23 O2 sat 95% HR 65 NuStep L 1 x 7 min 2nd step hip flexor stretch O2 93% then 96% HR 74 Heel/toe raises x 10  Lateral step over 1/2 foam roll 10x right/left Forward step overs 10x right/left  Sit to stand no hands 5x; sit to stand foam + 5# KB Single arm dead lifts with single arm support 5x right/left 5# single arm overhead press 5x right/left O2 95% HR 75 RPE 2 or 3/10 Seated red power cord rows/trunk extension 10x 6 inch step tap with single arm support needed 20x  6 inch step ups 5x right/left with 2 arm support  O2 93-94% "My back is fine"                                                                                                                             DATE:   05/07/23 NuStep L 5 x 5 min Standing extension 2x 10 with back to counter  Heel raises x 10  Toe raises x 10 HS curls x 10 B Rest break RPE 4 -  moderate Seated LAQ x 10 Mini squat x 10 Hip ABD and Ext x 10 ea B Marching x 10 B Rest break Then repeated heel and toe raises and minisquats.  Back extensions x 10    7/25    PATIENT EDUCATION: Education details: Educated patient on anatomy and physiology of current symptoms, prognosis, plan of care as well as initial self care strategies to promote recovery Person educated: Patient Education method: Explanation Education comprehension: verbalized understanding  HOME EXERCISE PROGRAM: To be started  GOALS: Goals reviewed with patient? Yes  SHORT TERM GOALS: Target date: 05/27/2023   The patient will demonstrate knowledge of basic self care strategies and exercises to promote strength and mobility  Baseline: Goal status: INITIAL  2.  Able to walk 2 min 375 feet with RPE 5/10 Baseline:  Goal status: INITIAL  3.  The patient will have an improved BERG balance score to   46 /56 indicating reduced risk of falls  Baseline:  Goal status: INITIAL  4.  Improved  LE strength and balance  indicated by improved 5x STS to 16 sec Baseline:  Goal status: INITIAL  5.  Set DGI for MiniBESTest goal Baseline:  Goal status: INITIAL    LONG TERM GOALS: Target date: 06/24/2023   The patient will be independent in a safe self progression of a home exercise program/gym program to promote further recovery of function  Baseline:  Goal status: INITIAL  2.  The patient will have improved gait stamina and speed needed to ambulate 600 feet in 6 minutes with RPE of 6/10  Baseline:  Goal status: INITIAL  3.  The patient will have an improved BERG balance score to  48  /56 indicating reduced risk of falls  Baseline:  Goal status: INITIAL  4.  The patient will have improved Timed Up and Go (TUG) time to    <12   sec indicating improved  gait speed and LE strength  Baseline:  Goal status: INITIAL  5.  The patient will have improved hip and knee strength to at least 4+/5 needed for standing >10 min for washing dishes Baseline:  Goal status: INITIAL   ASSESSMENT:  CLINICAL IMPRESSION: Today's treatment session focused on general strengthening and walking endurance. Patient was more fatigued this treatment session compared to previous treatment sessions and required adequate rest breaks. Patient was only able to tolerate walking two minutes due to back pain. Patient wants to improve walking and standing tolerance because he has a trip plan to [place] in May. Monitored patient's O2 stats and vitals throughout treatment session. Patient will continue to benefit from skilled therapy to address current impairments and functional limitations listed below.    OBJECTIVE IMPAIRMENTS: cardiopulmonary status limiting activity, decreased activity tolerance, decreased balance, decreased endurance, decreased mobility, difficulty walking, decreased strength, impaired perceived functional ability, and pain.   ACTIVITY LIMITATIONS: carrying, lifting, bending, standing, squatting, and locomotion level  PARTICIPATION LIMITATIONS: meal prep, cleaning, interpersonal relationship, shopping, and community activity  PERSONAL FACTORS: Fitness, Time since onset of injury/illness/exacerbation, and 3+ comorbidities: multiple myeloma, HTN, DDD lumbar spine  are also affecting patient's functional outcome.   REHAB POTENTIAL: Good  CLINICAL DECISION MAKING: Evolving/moderate complexity  EVALUATION COMPLEXITY: Moderate  PLAN:  PT FREQUENCY: 2x/week  PT DURATION: 8 weeks  PLANNED INTERVENTIONS: Therapeutic exercises, Therapeutic activity, Neuromuscular re-education, Balance training, Gait training, Patient/Family education, Self Care, Stair training, Aquatic Therapy, Cryotherapy, Moist heat, Manual therapy, and Re-evaluation  PLAN FOR NEXT  SESSION: Complete re-assessment; BERG, TUG  Lavinia Sharps, PT 06/22/23 4:27 PM Phone: [phone] Fax: [phone]

## 2023-06-24 ENCOUNTER — Encounter: Payer: Medicare Other | Admitting: Physical Therapy

## 2023-06-24 ENCOUNTER — Ambulatory Visit: Payer: Medicare Other | Admitting: Physical Therapy

## 2023-06-24 DIAGNOSIS — R2689 Other abnormalities of gait and mobility: Secondary | ICD-10-CM | POA: Diagnosis not present

## 2023-06-24 DIAGNOSIS — R262 Difficulty in walking, not elsewhere classified: Secondary | ICD-10-CM

## 2023-06-24 DIAGNOSIS — M6281 Muscle weakness (generalized): Secondary | ICD-10-CM | POA: Diagnosis not present

## 2023-06-24 DIAGNOSIS — G252 Other specified forms of tremor: Secondary | ICD-10-CM | POA: Diagnosis not present

## 2023-06-24 NOTE — Therapy (Signed)
OUTPATIENT PHYSICAL THERAPY TREATMENT/RECERTIFICATION  Patient Name: Jesse Mcclure. MRN: [id number] DOB:Feb 13, 1951, 72 y.o., adult Today's Date: 06/24/2023   PCP: Sharlot Gowda MD REFERRING PROVIDER: Artis Delay MD  END OF SESSION:  PT End of Session - 06/24/23 1534     Visit Number 5    Date for PT Re-Evaluation 08/19/23    Authorization Type Medicare    Progress Note Due on Visit 10    PT Start Time 1532    PT Stop Time 1614    PT Time Calculation (min) 42 min    Activity Tolerance Patient tolerated treatment well               Past Medical History:  Diagnosis Date   BPH (benign prostatic hypertrophy)    Degenerative disc disease    Diverticulosis    Hemochromatosis    HTN (hypertension)    Hyperlipidemia    Multiple myeloma (HCC)    Polio    Status post Nissen fundoplication (without gastrostomy tube) procedure    for hiatal hernia.   Past Surgical History:  Procedure Laterality Date   FOOT SURGERY     HIATAL HERNIA REPAIR     OTHER SURGICAL HISTORY     Cataract Surgery--Both eyes   SKIN BIOPSY Right 04/13/2022   squamous cell carcinoma in stiu arising in actinic keratosis   TOTAL KNEE ARTHROPLASTY Left 07/15/2021   Procedure: TOTAL KNEE ARTHROPLASTY;  Surgeon: Teryl Lucy, MD;  Location: [facility];  Service: Orthopedics;  Laterality: Left;   Undescended testes Right 10/05/1966   Patient Active Problem List   Diagnosis Date Noted   Snoring 06/08/2023   Chronic midline low back pain without sciatica 06/01/2023   Idiopathic gout 06/01/2023   (HFpEF) heart failure with preserved ejection fraction (HCC) 05/17/2023   Shortness of breath 05/16/2023   Essential tremor 05/11/2023   Balance disorder 04/23/2023   Squamous cell carcinoma in situ 04/14/2023   Melanoma in situ (HCC) 04/14/2023   History of pulmonary embolus (PE) 09/04/2022   History of basal cell cancer 05/01/2022   First degree AV block 04/22/2022   History of skin cancer 04/21/2022    S/P TKR (total knee replacement), left 07/15/2021   Osteoarthritis of left knee 05/22/2021   Deficiency anemia 01/07/2021   ACE-inhibitor cough 12/16/2020   Tremor of both hands 10/11/2019   Peripheral neuropathy due to chemotherapy (HCC) 10/11/2019   Dry skin 10/11/2019   Insomnia disorder 09/06/2019   Loose stools 09/06/2019   S/P bone marrow transplant (HCC) 08/30/2019   Hypocalcemia 06/14/2019   Multiple myeloma without remission (HCC) 04/04/2019   Other constipation 04/04/2019   Glucose intolerance (impaired glucose tolerance) 05/20/2016   Vitamin D deficiency 05/20/2016   Chronic musculoskeletal pain 01/09/2016   History of orchiectomy, unilateral 05/20/2015   Obesity (BMI 30-39.9) 05/20/2015   Elevated PSA 05/20/2015   Arthritis 05/20/2015   Essential hypertension 03/02/2012   Hyperlipidemia 03/02/2012   Hemochromatosis, hereditary (HCC) 11/28/2008    ONSET DATE: > 1 year  REFERRING DIAG: G25.2 intention tremor; R26.89 balance disorder  THERAPY DIAG: difficulty in walking; fall risk Muscle weakness (generalized)  Difficulty in walking, not elsewhere classified  Balance disorder  Rationale for Evaluation and Treatment: Rehabilitation  SUBJECTIVE:  SUBJECTIVE STATEMENT: Doing OK today.  Denies excessive fatigue after last session. I think my balance is better than it was.  No falls or close calls but I felt wobbly with the crowd at [place] last night.  The shaking has come back so I'm going to restart some medication for that.   Eval: Being treated for cancer 4-5 years including oral chemo at home;  Dr wants me to walk and I use poles b/c I'm very wobbly.  Dr. says it's not the medication causing this.  Went on cruise in [place] a few months ago but I couldn't tour and stayed on the  boat. No falls but several close calls: 3-4 steps garage steps into the house, stumbled going up.  If I sit a while then get up and take a few steps, will feel wobbly.  Hold onto something to pick up something from the floor. Denies loss of sensation in feet. I sleep a lot.  I'm very sedentary.   My partner thinks I need to move more.  I ride the stationary bike Tuesday/Thursday occasionally.  I used to have a trainer prior to getting sick. I get wheelchair service at airports. [place] trip in May  Goes by [name]  PERTINENT HISTORY:  Oncology History  Multiple myeloma without remission (HCC)  03/20/2019 Imaging    1. Tumor involving the L5 and S1 and S2 segments of the spine as described with mass effects upon the left L4, L5, S1 and more distal left sacral nerves   Left TKR HTN Back pain  PAIN:  Are you having pain? Yes  NPRS scale:  7/10 took an oxy this morning Pain location: LBP   Aggravating factors: standing in the kitchen Relieving factors: ibuprofen, oxy if it gets bad   PRECAUTIONS: Fall    WEIGHT BEARING RESTRICTIONS: No  FALLS: Has patient fallen in last 6 months? No  LIVING ENVIRONMENT: Lives with: partner Lives in: House/apartment 2 story main living downstairs Stairs: Yes: Internal: 10 steps; on right going up and External: 3 steps; on left going up Has following equipment at home: walking poles   PLOF: partner does cooking, pt does some grilling Enjoys being with friends, hanging out; doesn't go to social events  b/c I can't stand for long time 10 minutes  PATIENT GOALS: I want to walk better, stand longer; going to [place] in May for 11 days; ride bicycles there;  I'd like to not have to use wheelchair service at the airport  The Patient-Specific Functional Scale  Initial:  I am going to ask you to identify up to 3 important activities that you are unable to do or are having difficulty with as a result of this problem.  Today are there any activities that  you are unable to do or having difficulty with because of this?  (Patient shown scale and patient rated each activity)  Follow up: When you first came in you had difficulty performing these activities.  Today do you still have difficulty?  Patient-Specific activity scoring scheme (Point to one number):  0 1 2 3 4 5 6 7 8 9  10 Unable  Able to perform To perform                                                                                                    activity at the same Activity         Level as before                                                                                                                       Injury or problem  Activity     Standing for parties                                    Initial:       4                2.        Going to [employer] show                              Initial:    3                      OBJECTIVE:    COGNITION: Overall cognitive status: Within functional limits for tasks assessed    LOWER EXTREMITY ROM:   grossly WFLs  LOWER EXTREMITY MMT:    MMT Right Eval Left Eval  Hip flexion    Hip extension    Hip abduction 4 4  Hip adduction    Hip internal rotation    Hip external rotation    Knee flexion    Knee extension 4 4  Ankle dorsiflexion 4 4  Ankle plantarflexion 4 4  Ankle inversion    Ankle eversion    (Blank rows = not tested)           TRUNK STRENGTH:  Decreased activation of transverse abdominus muscles; abdominals 4-/5; decreased activation of lumbar multifidi; trunk extensors 4-/5                                  GAIT: Comments: wider base of support; decreased step length; decreased dorsiflexion  FUNCTIONAL TESTS:  5x sit to stand: 20.31 no hands TUG 12.11 sec no hands 2 min walk test 366 feet RPE 7/10 BERG balance test:  43/56 indicating a 50% risk of falls; most challenged by single limb standing  or narrow base of support; decreased  speed of movement, limited forward reach  9/19: 5 sit to stand 12.33 no hands TUG 11.61 no hands BERG: 49/56 Dynamic Gait Index: 18/24 difficulty with head turns and head extension    TODAY'S TREATMENT:       DATE:  06/24/23 NuStep L 5 x 6 min (green machine) while discussing status 5x STS TUG BERG O2 93%  HR 101 Dynamic Gait Index Standing hip abduction alternating sides and step taps 45 sec Standing Heel/toe raises x 10  93% end of session  06/22/23 NuStep L 1 x 7 min O2 94%  HR 101 Seated shoulder press 5# 2 x 10 2 MWT: 387ft; patient had to sit because of back pain O2 88% then 93% within 1-50min Heel/toe raises x 10  LAQs x 10 Sit to Stand no hands x 6 Standing Biceps Curls 2 x 10  6 inch step tap with single arm support needed 20x Standing Power Cord Row 2 x 10 Standing Power Cord ER 2 x 10 Hurdles Forwards and Side ways O2 97% HR 102 Seated Punches 4# 2 x 10  06/08/23 O2 sat 95% HR 65 NuStep L 1 x 7 min 2nd step hip flexor stretch O2 93% then 96% HR 74 Heel/toe raises x 10  Lateral step over 1/2 foam roll 10x right/left Forward step overs 10x right/left  Sit to stand no hands 5x; sit to stand foam + 5# KB Single arm dead lifts with single arm support 5x right/left 5# single arm overhead press 5x right/left O2 95% HR 75 RPE 2 or 3/10 Seated red power cord rows/trunk extension 10x 6 inch step tap with single arm support needed 20x  6 inch step ups 5x right/left with 2 arm support  O2 93-94% "My back is fine"                                                                                                                                PATIENT EDUCATION: Education details: Educated patient on anatomy and physiology of current symptoms, prognosis, plan of care as well as initial self care strategies to promote recovery Person educated: Patient Education method: Explanation Education comprehension: verbalized  understanding  HOME EXERCISE PROGRAM: To be started  GOALS: Goals reviewed with patient? Yes  SHORT TERM GOALS: Target date: 05/27/2023   The patient will demonstrate knowledge of basic self care strategies and exercises to promote strength and mobility  Baseline: Goal status: met 9/19  2.  Able to walk 2 min 375 feet with RPE 5/10 Baseline:  Goal status: ongoing  3.  The patient will have an improved BERG balance score to   46 /56 indicating reduced risk of falls  Baseline:  Goal status: met 9/19  4.  Improved LE strength and balance  indicated by improved 5x STS to 16 sec Baseline:  Goal status: met 9/19  5.  Set DGI for MiniBESTest goal Baseline:  Goal status: met 9/19  LONG TERM GOALS: Target date: 08/19/2023   The patient will be independent in a safe self progression of a home exercise program/gym program to promote further recovery of function  Baseline:  Goal status: ongoing  2.  The patient will have improved gait stamina and speed needed to ambulate 600 feet in 6 minutes with RPE of 6/10  Baseline:  Goal status: =ongoing  3.  The patient will have an improved BERG balance score to  48  /56 indicating reduced risk of falls  Baseline:  Goal status: met 9/16  4.  The patient will have improved Timed Up and Go (TUG) time to    <12   sec indicating improved gait speed and LE strength  Baseline:  Goal status: met 9/19  5.  The patient will have improved hip and knee strength to at least 4+/5 needed for standing >10 min for washing dishes Baseline:  Goal status: ongoing  6. Improved balance with DGI to 21/24 indicating decreased risk of falls  7. Crowds at [facility] and walk to car/parking lot with PSFS to 5  8. Standing for 3 minutes for parties for socializing with PSFS of 6   ASSESSMENT:  CLINICAL IMPRESSION: The patient demonstrates excellent improvements in 5x STS, TUG and BERG balance tests.  He is limited in walking tolerance by back pain  as well as shortness of breath related to CHF.  Balance tests indicate pt is still at risk for falls with impairments with single limb standing and walking with head turns.  The patient was hospitalized during this episode of care and missed PT for 3 weeks but despite this he has made progress toward goals.  The patient would benefit from a continuation of skilled PT for a further progression of strengthening and functional mobility.  Will continue to update and promote independence in a HEP needed for a return to the highest functional level possible with ADLs.      OBJECTIVE IMPAIRMENTS: cardiopulmonary status limiting activity, decreased activity tolerance, decreased balance, decreased endurance, decreased mobility, difficulty walking, decreased strength, impaired perceived functional ability, and pain.   ACTIVITY LIMITATIONS: carrying, lifting, bending, standing, squatting, and locomotion level  PARTICIPATION LIMITATIONS: meal prep, cleaning, interpersonal relationship, shopping, and community activity  PERSONAL FACTORS: Fitness, Time since onset of injury/illness/exacerbation, and 3+ comorbidities: multiple myeloma, HTN, DDD lumbar spine  are also affecting patient's functional outcome.   REHAB POTENTIAL: Good  CLINICAL DECISION MAKING: Evolving/moderate complexity  EVALUATION COMPLEXITY: Moderate  PLAN:  PT FREQUENCY: 2x/week  PT DURATION: 8 weeks  PLANNED INTERVENTIONS: Therapeutic exercises, Therapeutic activity, Neuromuscular re-education, Balance training, Gait training, Patient/Family education, Self Care, Stair training, Aquatic Therapy, Cryotherapy, Moist heat, Manual therapy, and Re-evaluation  PLAN FOR NEXT SESSION: see how standing went at cocktail party at [facility];  single leg and narrow base of support balance; dynamic gait with head turns; general strengthening; endurance   Lavinia Sharps, PT 06/24/23 6:59 PM Phone: [phone] Fax:  [phone]

## 2023-06-29 ENCOUNTER — Ambulatory Visit: Payer: Medicare Other | Admitting: Physical Therapy

## 2023-06-29 DIAGNOSIS — M6281 Muscle weakness (generalized): Secondary | ICD-10-CM | POA: Diagnosis not present

## 2023-06-29 DIAGNOSIS — G252 Other specified forms of tremor: Secondary | ICD-10-CM | POA: Diagnosis not present

## 2023-06-29 DIAGNOSIS — R2689 Other abnormalities of gait and mobility: Secondary | ICD-10-CM | POA: Diagnosis not present

## 2023-06-29 DIAGNOSIS — R262 Difficulty in walking, not elsewhere classified: Secondary | ICD-10-CM

## 2023-06-29 NOTE — Therapy (Signed)
OUTPATIENT PHYSICAL THERAPY TREATMENT  Patient Name: Jesse Mcclure. MRN: [id number] DOB:1951-02-01, 72 y.o., adult Today's Date: 06/29/2023   PCP: Sharlot Gowda MD REFERRING PROVIDER: Artis Delay MD  END OF SESSION:  PT End of Session - 06/29/23 1528     Visit Number 6    Date for PT Re-Evaluation 08/19/23    Authorization Type Medicare    Progress Note Due on Visit 10    PT Start Time 1530    PT Stop Time 1610    PT Time Calculation (min) 40 min    Activity Tolerance Patient tolerated treatment well               Past Medical History:  Diagnosis Date   BPH (benign prostatic hypertrophy)    Degenerative disc disease    Diverticulosis    Hemochromatosis    HTN (hypertension)    Hyperlipidemia    Multiple myeloma (HCC)    Polio    Status post Nissen fundoplication (without gastrostomy tube) procedure    for hiatal hernia.   Past Surgical History:  Procedure Laterality Date   FOOT SURGERY     HIATAL HERNIA REPAIR     OTHER SURGICAL HISTORY     Cataract Surgery--Both eyes   SKIN BIOPSY Right 04/13/2022   squamous cell carcinoma in stiu arising in actinic keratosis   TOTAL KNEE ARTHROPLASTY Left 07/15/2021   Procedure: TOTAL KNEE ARTHROPLASTY;  Surgeon: Teryl Lucy, MD;  Location: [facility];  Service: Orthopedics;  Laterality: Left;   Undescended testes Right 10/05/1966   Patient Active Problem List   Diagnosis Date Noted   Snoring 06/08/2023   Chronic midline low back pain without sciatica 06/01/2023   Idiopathic gout 06/01/2023   (HFpEF) heart failure with preserved ejection fraction (HCC) 05/17/2023   Shortness of breath 05/16/2023   Essential tremor 05/11/2023   Balance disorder 04/23/2023   Squamous cell carcinoma in situ 04/14/2023   Melanoma in situ (HCC) 04/14/2023   History of pulmonary embolus (PE) 09/04/2022   History of basal cell cancer 05/01/2022   First degree AV block 04/22/2022   History of skin cancer 04/21/2022   S/P TKR (total  knee replacement), left 07/15/2021   Osteoarthritis of left knee 05/22/2021   Deficiency anemia 01/07/2021   ACE-inhibitor cough 12/16/2020   Tremor of both hands 10/11/2019   Peripheral neuropathy due to chemotherapy (HCC) 10/11/2019   Dry skin 10/11/2019   Insomnia disorder 09/06/2019   Loose stools 09/06/2019   S/P bone marrow transplant (HCC) 08/30/2019   Hypocalcemia 06/14/2019   Multiple myeloma without remission (HCC) 04/04/2019   Other constipation 04/04/2019   Glucose intolerance (impaired glucose tolerance) 05/20/2016   Vitamin D deficiency 05/20/2016   Chronic musculoskeletal pain 01/09/2016   History of orchiectomy, unilateral 05/20/2015   Obesity (BMI 30-39.9) 05/20/2015   Elevated PSA 05/20/2015   Arthritis 05/20/2015   Essential hypertension 03/02/2012   Hyperlipidemia 03/02/2012   Hemochromatosis, hereditary (HCC) 11/28/2008    ONSET DATE: > 1 year  REFERRING DIAG: G25.2 intention tremor; R26.89 balance disorder  THERAPY DIAG: difficulty in walking; fall risk Muscle weakness (generalized)  Difficulty in walking, not elsewhere classified  Balance disorder  Rationale for Evaluation and Treatment: Rehabilitation  SUBJECTIVE:  SUBJECTIVE STATEMENT: I feel asleep and just woke up at 3:15.  The cocktail party was good.  I walked and sat at the bar.  Denies being overly tired or sore after last visit.  I went to the [employer] and walked a lot.    Eval: Being treated for cancer 4-5 years including oral chemo at home;  Dr wants me to walk and I use poles b/c I'm very wobbly.  Dr. says it's not the medication causing this.  Went on cruise in [place] a few months ago but I couldn't tour and stayed on the boat. No falls but several close calls: 3-4 steps garage steps into the  house, stumbled going up.  If I sit a while then get up and take a few steps, will feel wobbly.  Hold onto something to pick up something from the floor. Denies loss of sensation in feet. I sleep a lot.  I'm very sedentary.   My partner thinks I need to move more.  I ride the stationary bike Tuesday/Thursday occasionally.  I used to have a trainer prior to getting sick. I get wheelchair service at airports. [place] trip in May  Goes by Jesse Mcclure  PERTINENT HISTORY:  Oncology History  Multiple myeloma without remission (HCC)  03/20/2019 Imaging    1. Tumor involving the L5 and S1 and S2 segments of the spine as described with mass effects upon the left L4, L5, S1 and more distal left sacral nerves   Left TKR HTN Back pain  PAIN:  Are you having pain? Yes  NPRS scale:  7/10 took an oxy this morning Pain location: LBP   Aggravating factors: standing in the kitchen Relieving factors: ibuprofen, oxy if it gets bad   PRECAUTIONS: Fall    WEIGHT BEARING RESTRICTIONS: No  FALLS: Has patient fallen in last 6 months? No  LIVING ENVIRONMENT: Lives with: partner Lives in: House/apartment 2 story main living downstairs Stairs: Yes: Internal: 10 steps; on right going up and External: 3 steps; on left going up Has following equipment at home: walking poles   PLOF: partner does cooking, pt does some grilling Enjoys being with friends, hanging out; doesn't go to social events  b/c I can't stand for long time 10 minutes  PATIENT GOALS: I want to walk better, stand longer; going to [place] in May for 11 days; ride bicycles there;  I'd like to not have to use wheelchair service at the airport  The Patient-Specific Functional Scale  Initial:  I am going to ask you to identify up to 3 important activities that you are unable to do or are having difficulty with as a result of this problem.  Today are there any activities that you are unable to do or having difficulty with because of this?  (Patient  shown scale and patient rated each activity)  Follow up: When you first came in you had difficulty performing these activities.  Today do you still have difficulty?  Patient-Specific activity scoring scheme (Point to one number):  0 1 2 3 4 5 6 7 8 9  10 Unable  Able to perform To perform                                                                                                    activity at the same Activity         Level as before                                                                                                                       Injury or problem  Activity     Standing for parties                                    Initial:       4                2.        Going to [employer] show                              Initial:    3                      OBJECTIVE:    COGNITION: Overall cognitive status: Within functional limits for tasks assessed    LOWER EXTREMITY ROM:   grossly WFLs  LOWER EXTREMITY MMT:    MMT Right Eval Left Eval  Hip flexion    Hip extension    Hip abduction 4 4  Hip adduction    Hip internal rotation    Hip external rotation    Knee flexion    Knee extension 4 4  Ankle dorsiflexion 4 4  Ankle plantarflexion 4 4  Ankle inversion    Ankle eversion    (Blank rows = not tested)           TRUNK STRENGTH:  Decreased activation of transverse abdominus muscles; abdominals 4-/5; decreased activation of lumbar multifidi; trunk extensors 4-/5                                  GAIT: Comments: wider base of support; decreased step length; decreased dorsiflexion  FUNCTIONAL TESTS:  5x sit to stand: 20.31 no hands TUG 12.11 sec no hands 2 min walk test 366 feet RPE 7/10 BERG balance test:  43/56 indicating a 50% risk of falls; most challenged by single limb standing or narrow base of support; decreased speed  of movement, limited forward  reach  9/19: 5 sit to stand 12.33 no hands TUG 11.61 no hands BERG: 49/56 Dynamic Gait Index: 18/24 difficulty with head turns and head extension    TODAY'S TREATMENT:       DATE:  06/29/23 NuStep L 3 x 6 min (blue machine) while discussing status 97% HR 65 Heel and toe raises 10x Seated red power rows/trunk extension 20x Sit to stands: 2 sets of 5 Railing push ups:   2 sets of 8 Dead lift with   5  # KB  to tall cone between ankles:  10x UE pull:  green band row 15x 6 inch step ups: 2 sets of 5  with 2 arm support of railing Abdominals:  chair sit ups with 3# dumbbells with overhead press Blue band rows standing 10x  Dynamic Balance with cognitive layer: reaching letters and numbers overhead, lateral movements 95%  HR 97  RPE 5/10 2:15 minute walk: 415 feet  06/24/23 NuStep L 5 x 6 min (green machine) while discussing status 5x STS TUG BERG O2 93%  HR 101 Dynamic Gait Index Standing hip abduction alternating sides and step taps 45 sec Standing Heel/toe raises x 10  93% end of session  06/22/23 NuStep L 1 x 7 min O2 94%  HR 101 Seated shoulder press 5# 2 x 10 2 MWT: 362ft; patient had to sit because of back pain O2 88% then 93% within 1-87min Heel/toe raises x 10  LAQs x 10 Sit to Stand no hands x 6 Standing Biceps Curls 2 x 10  6 inch step tap with single arm support needed 20x Standing Power Cord Row 2 x 10 Standing Power Cord ER 2 x 10 Hurdles Forwards and Side ways O2 97% HR 102 Seated Punches 4# 2 x 10  06/08/23 O2 sat 95% HR 65 NuStep L 1 x 7 min 2nd step hip flexor stretch O2 93% then 96% HR 74 Heel/toe raises x 10  Lateral step over 1/2 foam roll 10x right/left Forward step overs 10x right/left  Sit to stand no hands 5x; sit to stand foam + 5# KB Single arm dead lifts with single arm support 5x right/left 5# single arm overhead press 5x right/left O2 95% HR 75 RPE 2 or 3/10 Seated red power cord rows/trunk extension 10x 6 inch step tap with  single arm support needed 20x  6 inch step ups 5x right/left with 2 arm support  O2 93-94% "My back is fine"                                                                                                                                PATIENT EDUCATION: Education details: Educated patient on anatomy and physiology of current symptoms, prognosis, plan of care as well as initial self care strategies to promote recovery Person educated: Patient Education method: Explanation Education comprehension: verbalized understanding  HOME EXERCISE PROGRAM: To be started  GOALS:  Goals reviewed with patient? Yes  SHORT TERM GOALS: Target date: 05/27/2023   The patient will demonstrate knowledge of basic self care strategies and exercises to promote strength and mobility  Baseline: Goal status: met 9/19  2.  Able to walk 2 min 375 feet with RPE 5/10 Baseline:  Goal status: ongoing  3.  The patient will have an improved BERG balance score to   46 /56 indicating reduced risk of falls  Baseline:  Goal status: met 9/19  4.  Improved LE strength and balance  indicated by improved 5x STS to 16 sec Baseline:  Goal status: met 9/19  5.  Set DGI for MiniBESTest goal Baseline:  Goal status: met 9/19    LONG TERM GOALS: Target date: 08/19/2023   The patient will be independent in a safe self progression of a home exercise program/gym program to promote further recovery of function  Baseline:  Goal status: ongoing  2.  The patient will have improved gait stamina and speed needed to ambulate 600 feet in 6 minutes with RPE of 6/10  Baseline:  Goal status: =ongoing  3.  The patient will have an improved BERG balance score to  48  /56 indicating reduced risk of falls  Baseline:  Goal status: met 9/16  4.  The patient will have improved Timed Up and Go (TUG) time to    <12   sec indicating improved gait speed and LE strength  Baseline:  Goal status: met 9/19  5.  The patient will  have improved hip and knee strength to at least 4+/5 needed for standing >10 min for washing dishes Baseline:  Goal status: ongoing  6. Improved balance with DGI to 21/24 indicating decreased risk of falls  7. Crowds at [facility] and walk to car/parking lot with PSFS to 5  8. Standing for 3 minutes for parties for socializing with PSFS of 6   ASSESSMENT:  CLINICAL IMPRESSION: Able to progress LE strengthening and included dynamic balance challenges as well.  No loss of balance but speed of movement significantly decreased with cognitive layering.  No complaints of LBP with a combination of seated exercise after every 2-3 standing ex's.  O2 saturation and heart rate monitored with normal levels throughout session.     OBJECTIVE IMPAIRMENTS: cardiopulmonary status limiting activity, decreased activity tolerance, decreased balance, decreased endurance, decreased mobility, difficulty walking, decreased strength, impaired perceived functional ability, and pain.   ACTIVITY LIMITATIONS: carrying, lifting, bending, standing, squatting, and locomotion level  PARTICIPATION LIMITATIONS: meal prep, cleaning, interpersonal relationship, shopping, and community activity  PERSONAL FACTORS: Fitness, Time since onset of injury/illness/exacerbation, and 3+ comorbidities: multiple myeloma, HTN, DDD lumbar spine  are also affecting patient's functional outcome.   REHAB POTENTIAL: Good  CLINICAL DECISION MAKING: Evolving/moderate complexity  EVALUATION COMPLEXITY: Moderate  PLAN:  PT FREQUENCY: 2x/week  PT DURATION: 8 weeks  PLANNED INTERVENTIONS: Therapeutic exercises, Therapeutic activity, Neuromuscular re-education, Balance training, Gait training, Patient/Family education, Self Care, Stair training, Aquatic Therapy, Cryotherapy, Moist heat, Manual therapy, and Re-evaluation  PLAN FOR NEXT SESSION: Clock Yourself;   Mini Best test; single leg and narrow base of support balance; dynamic gait with  head turns; general strengthening; endurance  Lavinia Sharps, PT 06/29/23 5:14 PM Phone: [phone] Fax: [phone]

## 2023-07-01 ENCOUNTER — Ambulatory Visit: Payer: Medicare Other | Admitting: Physical Therapy

## 2023-07-01 DIAGNOSIS — R262 Difficulty in walking, not elsewhere classified: Secondary | ICD-10-CM | POA: Diagnosis not present

## 2023-07-01 DIAGNOSIS — R2689 Other abnormalities of gait and mobility: Secondary | ICD-10-CM

## 2023-07-01 DIAGNOSIS — M6281 Muscle weakness (generalized): Secondary | ICD-10-CM | POA: Diagnosis not present

## 2023-07-01 DIAGNOSIS — G252 Other specified forms of tremor: Secondary | ICD-10-CM | POA: Diagnosis not present

## 2023-07-01 NOTE — Therapy (Signed)
OUTPATIENT PHYSICAL THERAPY TREATMENT  Patient Name: Jesse Mcclure. MRN: [id number] DOB:03/13/1951, 72 y.o., adult Today's Date: 07/01/2023   PCP: Sharlot Gowda MD REFERRING PROVIDER: Artis Delay MD  END OF SESSION:  PT End of Session - 07/01/23 0935     Visit Number 7    Date for PT Re-Evaluation 08/19/23    Authorization Type Medicare    Progress Note Due on Visit 10    PT Start Time 0933    PT Stop Time 1015    PT Time Calculation (min) 42 min    Activity Tolerance Patient tolerated treatment well               Past Medical History:  Diagnosis Date   BPH (benign prostatic hypertrophy)    Degenerative disc disease    Diverticulosis    Hemochromatosis    HTN (hypertension)    Hyperlipidemia    Multiple myeloma (HCC)    Polio    Status post Nissen fundoplication (without gastrostomy tube) procedure    for hiatal hernia.   Past Surgical History:  Procedure Laterality Date   FOOT SURGERY     HIATAL HERNIA REPAIR     OTHER SURGICAL HISTORY     Cataract Surgery--Both eyes   SKIN BIOPSY Right 04/13/2022   squamous cell carcinoma in stiu arising in actinic keratosis   TOTAL KNEE ARTHROPLASTY Left 07/15/2021   Procedure: TOTAL KNEE ARTHROPLASTY;  Surgeon: Teryl Lucy, MD;  Location: [facility];  Service: Orthopedics;  Laterality: Left;   Undescended testes Right 10/05/1966   Patient Active Problem List   Diagnosis Date Noted   Snoring 06/08/2023   Chronic midline low back pain without sciatica 06/01/2023   Idiopathic gout 06/01/2023   (HFpEF) heart failure with preserved ejection fraction (HCC) 05/17/2023   Shortness of breath 05/16/2023   Essential tremor 05/11/2023   Balance disorder 04/23/2023   Squamous cell carcinoma in situ 04/14/2023   Melanoma in situ (HCC) 04/14/2023   History of pulmonary embolus (PE) 09/04/2022   History of basal cell cancer 05/01/2022   First degree AV block 04/22/2022   History of skin cancer 04/21/2022   S/P TKR (total  knee replacement), left 07/15/2021   Osteoarthritis of left knee 05/22/2021   Deficiency anemia 01/07/2021   ACE-inhibitor cough 12/16/2020   Tremor of both hands 10/11/2019   Peripheral neuropathy due to chemotherapy (HCC) 10/11/2019   Dry skin 10/11/2019   Insomnia disorder 09/06/2019   Loose stools 09/06/2019   S/P bone marrow transplant (HCC) 08/30/2019   Hypocalcemia 06/14/2019   Multiple myeloma without remission (HCC) 04/04/2019   Other constipation 04/04/2019   Glucose intolerance (impaired glucose tolerance) 05/20/2016   Vitamin D deficiency 05/20/2016   Chronic musculoskeletal pain 01/09/2016   History of orchiectomy, unilateral 05/20/2015   Obesity (BMI 30-39.9) 05/20/2015   Elevated PSA 05/20/2015   Arthritis 05/20/2015   Essential hypertension 03/02/2012   Hyperlipidemia 03/02/2012   Hemochromatosis, hereditary (HCC) 11/28/2008    ONSET DATE: > 1 year  REFERRING DIAG: G25.2 intention tremor; R26.89 balance disorder  THERAPY DIAG: difficulty in walking; fall risk Muscle weakness (generalized)  Difficulty in walking, not elsewhere classified  Balance disorder  Rationale for Evaluation and Treatment: Rehabilitation  SUBJECTIVE:  SUBJECTIVE STATEMENT: I just started back my chemo pill so I didn't sleep well last night and more tired today. It also causes my back to hurt.  Getting infusion tomorrow to strengthen the bones and that makes me hurt all over. I may have to cancel next time b/c of this.    Eval: Being treated for cancer 4-5 years including oral chemo at home;  Dr wants me to walk and I use poles b/c I'm very wobbly.  Dr. says it's not the medication causing this.  Went on cruise in [place] a few months ago but I couldn't tour and stayed on the boat. No falls but several  close calls: 3-4 steps garage steps into the house, stumbled going up.  If I sit a while then get up and take a few steps, will feel wobbly.  Hold onto something to pick up something from the floor. Denies loss of sensation in feet. I sleep a lot.  I'm very sedentary.   My partner thinks I need to move more.  I ride the stationary bike Tuesday/Thursday occasionally.  I used to have a trainer prior to getting sick. I get wheelchair service at airports. [place] trip in May  Goes by Jesse Mcclure  PERTINENT HISTORY:  Oncology History  Multiple myeloma without remission (HCC)  03/20/2019 Imaging    1. Tumor involving the L5 and S1 and S2 segments of the spine as described with mass effects upon the left L4, L5, S1 and more distal left sacral nerves   Left TKR HTN Back pain  PAIN:  Are you having pain? Yes  NPRS scale:  7/10 took an oxy this morning Pain location: LBP   Aggravating factors: standing in the kitchen Relieving factors: ibuprofen, oxy if it gets bad   PRECAUTIONS: Fall    WEIGHT BEARING RESTRICTIONS: No  FALLS: Has patient fallen in last 6 months? No  LIVING ENVIRONMENT: Lives with: partner Lives in: House/apartment 2 story main living downstairs Stairs: Yes: Internal: 10 steps; on right going up and External: 3 steps; on left going up Has following equipment at home: walking poles   PLOF: partner does cooking, pt does some grilling Enjoys being with friends, hanging out; doesn't go to social events  b/c I can't stand for long time 10 minutes  PATIENT GOALS: I want to walk better, stand longer; going to [place] in May for 11 days; ride bicycles there;  I'd like to not have to use wheelchair service at the airport  The Patient-Specific Functional Scale  Initial:  I am going to ask you to identify up to 3 important activities that you are unable to do or are having difficulty with as a result of this problem.  Today are there any activities that you are unable to do or  having difficulty with because of this?  (Patient shown scale and patient rated each activity)  Follow up: When you first came in you had difficulty performing these activities.  Today do you still have difficulty?  Patient-Specific activity scoring scheme (Point to one number):  0 1 2 3 4 5 6 7 8 9  10 Unable  Able to perform To perform                                                                                                    activity at the same Activity         Level as before                                                                                                                       Injury or problem  Activity     Standing for parties                                    Initial:       4                2.        Going to [employer] show                              Initial:    3                      OBJECTIVE:    COGNITION: Overall cognitive status: Within functional limits for tasks assessed    LOWER EXTREMITY ROM:   grossly WFLs  LOWER EXTREMITY MMT:    MMT Right Eval Left Eval  Hip flexion    Hip extension    Hip abduction 4 4  Hip adduction    Hip internal rotation    Hip external rotation    Knee flexion    Knee extension 4 4  Ankle dorsiflexion 4 4  Ankle plantarflexion 4 4  Ankle inversion    Ankle eversion    (Blank rows = not tested)           TRUNK STRENGTH:  Decreased activation of transverse abdominus muscles; abdominals 4-/5; decreased activation of lumbar multifidi; trunk extensors 4-/5                                  GAIT: Comments: wider base of support; decreased step length; decreased dorsiflexion  FUNCTIONAL TESTS:  5x sit to stand: 20.31 no hands TUG 12.11 sec no hands 2 min walk test 366 feet RPE 7/10 BERG balance test:  43/56 indicating a 50% risk of falls; most challenged by single limb standing or narrow base of  support; decreased  speed of movement, limited forward reach  9/19: 5 sit to stand 12.33 no hands TUG 11.61 no hands BERG: 49/56 Dynamic Gait Index: 18/24 difficulty with head turns and head extension    TODAY'S TREATMENT:       DATE:  06/29/23 NuStep L 1 x 5 min (blue machine) while discussing status  Seated red power rows/trunk extension 20x Standing alternating lateral cone step overs 5x (fatigues quickly) Seated chair sit up holding pair of 3#     2 sets of 8x (2nd set with overhead press) Sit to stands holding pair of 3#: 1 sets of 5 Seated dead lift with red power cord under feet 12x Seated hip flexion up and over dumbbell on floor with 5# KB resting on thigh O2 94% HR 84  RPE 8/10 Seated red power cord press out 10x  Seated blue band rows 20x  06/29/23 NuStep L 3 x 6 min (blue machine) while discussing status 97% HR 65 Heel and toe raises 10x Seated red power rows/trunk extension 20x Sit to stands: 2 sets of 5 Railing push ups:   2 sets of 8 Dead lift with   5  # KB  to tall cone between ankles:  10x UE pull:  green band row 15x 6 inch step ups: 2 sets of 5  with 2 arm support of railing Abdominals:  chair sit ups with 3# dumbbells with overhead press Blue band rows standing 10x  Dynamic Balance with cognitive layer: reaching letters and numbers overhead, lateral movements 95%  HR 97  RPE 5/10 2:15 minute walk: 415 feet  06/24/23 NuStep L 5 x 6 min (green machine) while discussing status 5x STS TUG BERG O2 93%  HR 101 Dynamic Gait Index Standing hip abduction alternating sides and step taps 45 sec Standing Heel/toe raises x 10  93% end of session  06/22/23 NuStep L 1 x 7 min O2 94%  HR 101 Seated shoulder press 5# 2 x 10 2 MWT: 338ft; patient had to sit because of back pain O2 88% then 93% within 1-14min Heel/toe raises x 10  LAQs x 10 Sit to Stand no hands x 6 Standing Biceps Curls 2 x 10  6 inch step tap with single arm support needed 20x Standing Power  Cord Row 2 x 10 Standing Power Cord ER 2 x 10 Hurdles Forwards and Side ways O2 97% HR 102 Seated Punches 4# 2 x 10                                                                                                                              PATIENT EDUCATION: Education details: Educated patient on anatomy and physiology of current symptoms, prognosis, plan of care as well as initial self care strategies to promote recovery Person educated: Patient Education method: Explanation Education comprehension: verbalized understanding  HOME EXERCISE PROGRAM: To be started  GOALS: Goals reviewed with patient? Yes  SHORT TERM GOALS: Target date: 05/27/2023  The patient will demonstrate knowledge of basic self care strategies and exercises to promote strength and mobility  Baseline: Goal status: met 9/19  2.  Able to walk 2 min 375 feet with RPE 5/10 Baseline:  Goal status: ongoing  3.  The patient will have an improved BERG balance score to   46 /56 indicating reduced risk of falls  Baseline:  Goal status: met 9/19  4.  Improved LE strength and balance  indicated by improved 5x STS to 16 sec Baseline:  Goal status: met 9/19  5.  Set DGI for MiniBESTest goal Baseline:  Goal status: met 9/19    LONG TERM GOALS: Target date: 08/19/2023   The patient will be independent in a safe self progression of a home exercise program/gym program to promote further recovery of function  Baseline:  Goal status: ongoing  2.  The patient will have improved gait stamina and speed needed to ambulate 600 feet in 6 minutes with RPE of 6/10  Baseline:  Goal status: =ongoing  3.  The patient will have an improved BERG balance score to  48  /56 indicating reduced risk of falls  Baseline:  Goal status: met 9/16  4.  The patient will have improved Timed Up and Go (TUG) time to    <12   sec indicating improved gait speed and LE strength  Baseline:  Goal status: met 9/19  5.  The  patient will have improved hip and knee strength to at least 4+/5 needed for standing >10 min for washing dishes Baseline:  Goal status: ongoing  6. Improved balance with DGI to 21/24 indicating decreased risk of falls  7. Crowds at [facility] and walk to car/parking lot with PSFS to 5  8. Standing for 3 minutes for parties for socializing with PSFS of 6   ASSESSMENT:  CLINICAL IMPRESSION: Treatment modified to be lower intensity secondary to side effects from restarting chemo pill (increased fatigue and back pain).   Therapist monitoring fatigue level with rating of perceived exertion RPE as well as vital signs and adjusting treatment as needed with more seated rest breaks and fewer repetitions than previous visits.    OBJECTIVE IMPAIRMENTS: cardiopulmonary status limiting activity, decreased activity tolerance, decreased balance, decreased endurance, decreased mobility, difficulty walking, decreased strength, impaired perceived functional ability, and pain.   ACTIVITY LIMITATIONS: carrying, lifting, bending, standing, squatting, and locomotion level  PARTICIPATION LIMITATIONS: meal prep, cleaning, interpersonal relationship, shopping, and community activity  PERSONAL FACTORS: Fitness, Time since onset of injury/illness/exacerbation, and 3+ comorbidities: multiple myeloma, HTN, DDD lumbar spine  are also affecting patient's functional outcome.   REHAB POTENTIAL: Good  CLINICAL DECISION MAKING: Evolving/moderate complexity  EVALUATION COMPLEXITY: Moderate  PLAN:  PT FREQUENCY: 2x/week  PT DURATION: 8 weeks  PLANNED INTERVENTIONS: Therapeutic exercises, Therapeutic activity, Neuromuscular re-education, Balance training, Gait training, Patient/Family education, Self Care, Stair training, Aquatic Therapy, Cryotherapy, Moist heat, Manual therapy, and Re-evaluation  PLAN FOR NEXT SESSION: pt states he may cancel next appt secondary to expected side effects from bone building infusion on  9/27; general strengthening; single leg and narrow base of support balance; dynamic gait with head turns; endurance  Lavinia Sharps, PT 07/01/23 9:36 AM Phone: [phone] Fax: [phone]

## 2023-07-02 ENCOUNTER — Inpatient Hospital Stay (HOSPITAL_BASED_OUTPATIENT_CLINIC_OR_DEPARTMENT_OTHER): Payer: Medicare Other | Admitting: Hematology and Oncology

## 2023-07-02 ENCOUNTER — Inpatient Hospital Stay: Payer: Medicare Other

## 2023-07-02 ENCOUNTER — Encounter: Payer: Self-pay | Admitting: Hematology and Oncology

## 2023-07-02 VITALS — BP 121/70 | HR 66 | Temp 98.0°F | Resp 18

## 2023-07-02 VITALS — BP 129/75 | HR 64 | Temp 97.6°F | Resp 18 | Ht 69.0 in | Wt 238.2 lb

## 2023-07-02 DIAGNOSIS — G620 Drug-induced polyneuropathy: Secondary | ICD-10-CM | POA: Diagnosis not present

## 2023-07-02 DIAGNOSIS — C9000 Multiple myeloma not having achieved remission: Secondary | ICD-10-CM

## 2023-07-02 DIAGNOSIS — I5032 Chronic diastolic (congestive) heart failure: Secondary | ICD-10-CM

## 2023-07-02 DIAGNOSIS — T451X5A Adverse effect of antineoplastic and immunosuppressive drugs, initial encounter: Secondary | ICD-10-CM

## 2023-07-02 DIAGNOSIS — R251 Tremor, unspecified: Secondary | ICD-10-CM

## 2023-07-02 DIAGNOSIS — Z79899 Other long term (current) drug therapy: Secondary | ICD-10-CM | POA: Diagnosis not present

## 2023-07-02 LAB — CBC WITH DIFFERENTIAL/PLATELET
Abs Immature Granulocytes: 0.01 10*3/uL (ref 0.00–0.07)
Basophils Absolute: 0.1 10*3/uL (ref 0.0–0.1)
Basophils Relative: 1 %
Eosinophils Absolute: 0.1 10*3/uL (ref 0.0–0.5)
Eosinophils Relative: 1 %
HCT: 43.7 % (ref 39.0–52.0)
Hemoglobin: 14.7 g/dL (ref 13.0–17.0)
Immature Granulocytes: 0 %
Lymphocytes Relative: 43 %
Lymphs Abs: 2 10*3/uL (ref 0.7–4.0)
MCH: 34.1 pg — ABNORMAL HIGH (ref 26.0–34.0)
MCHC: 33.6 g/dL (ref 30.0–36.0)
MCV: 101.4 fL — ABNORMAL HIGH (ref 80.0–100.0)
Monocytes Absolute: 0.9 10*3/uL (ref 0.1–1.0)
Monocytes Relative: 19 %
Neutro Abs: 1.7 10*3/uL (ref 1.7–7.7)
Neutrophils Relative %: 36 %
Platelets: 223 10*3/uL (ref 150–400)
RBC: 4.31 MIL/uL (ref 4.22–5.81)
RDW: 14.7 % (ref 11.5–15.5)
WBC: 4.6 10*3/uL (ref 4.0–10.5)
nRBC: 0 % (ref 0.0–0.2)

## 2023-07-02 LAB — COMPREHENSIVE METABOLIC PANEL
ALT: 27 U/L (ref 0–44)
AST: 22 U/L (ref 15–41)
Albumin: 3.9 g/dL (ref 3.5–5.0)
Alkaline Phosphatase: 53 U/L (ref 38–126)
Anion gap: 4 — ABNORMAL LOW (ref 5–15)
BUN: 17 mg/dL (ref 8–23)
CO2: 30 mmol/L (ref 22–32)
Calcium: 9.2 mg/dL (ref 8.9–10.3)
Chloride: 105 mmol/L (ref 98–111)
Creatinine, Ser: 0.82 mg/dL (ref 0.61–1.24)
GFR, Estimated: >60 mL/min (ref >60)
Glucose, Bld: 91 mg/dL (ref 70–99)
Potassium: 3.6 mmol/L (ref 3.5–5.1)
Sodium: 139 mmol/L (ref 135–145)
Total Bilirubin: 1.1 mg/dL (ref 0.3–1.2)
Total Protein: 6.7 g/dL (ref 6.5–8.1)

## 2023-07-02 LAB — FERRITIN: Ferritin: 93 ng/mL (ref 24–336)

## 2023-07-02 MED ORDER — ZOLEDRONIC ACID 4 MG/100ML IV SOLN
4.0000 mg | Freq: Once | INTRAVENOUS | Status: AC
  Administered 2023-07-02: 4 mg via INTRAVENOUS
  Filled 2023-07-02: qty 100

## 2023-07-02 MED ORDER — SODIUM CHLORIDE 0.9 % IV SOLN: Freq: Once | INTRAVENOUS | Status: AC

## 2023-07-02 NOTE — Progress Notes (Signed)
[facility] OFFICE PROGRESS NOTE  Patient Care Team: Ronnald Nian, MD as PCP - General (Family Medicine) Wyline Mood Alben Spittle, MD as PCP - Cardiology (Cardiology) Artis Delay, MD as Consulting Physician (Hematology and Oncology) Kennon Rounds as Physician Assistant (Cardiology)  HISTORY OF PRESENTING ILLNESS: Discussed the use of AI scribe software for clinical note transcription with the patient, who gave verbal consent to proceed.  History of Present Illness   The patient, currently undergoing treatment for multiple myeloma, reports no problems or side effects from the treatment. They have no issues obtaining their prescription refills. They had an MRI recently, which was initially suspected to show myeloma, but the final report was negative for myeloma.  The patient also reports a tremor, for which they are taking a 50mg  medication, halved to 25mg  as needed. They confirm that this medication is helping. They also have neuropathy, which they report is improving. They are currently undergoing physical therapy, focusing on balance and strength exercises. They deny any recent falls.  From a cardiac standpoint, the patient has a history of fluid retention and heart failure. They are managing these conditions with twice-daily Lasix, which they report is working well.  The patient is also being monitored for ferritin levels, with phlebotomy indicated if levels exceed 250.  They confirm they are taking calcium and vitamin D supplements.  The patient's weight has been stable around 238 pounds, with a recent low of 236 pounds. They are making dietary adjustments to maintain this weight, including reducing salt intake and avoiding spicy foods.  Overall, the patient's condition appears stable, with no new symptoms or issues reported. They are compliant with their medication regimen and are actively engaged in managing their health.         Assessment and Plan    Multiple  Myeloma Stable on current treatment with no reported side effects. Recent MRI did not show myeloma. -Continue current treatment regimen. -Check myeloma panel today, results expected next week.  Tremor Managed with as-needed medication, reported improvement. -Continue current medication as needed.  Peripheral Neuropathy Improvement noted, currently undergoing physical therapy for balance and strength. -Continue physical therapy.  Congestive Heart Failure Managed with twice daily Lasix, no current fluid retention issues. -Continue Lasix twice daily.  Iron Overload Annual ferritin level checked today. -If ferritin >250, proceed with phlebotomy.  General Health Maintenance -Dental check-up scheduled for October. -Continue Calcium and Vitamin D supplementation. -Zometa treatment today, next due in March. -Weight stable, continue current dietary regimen. -Follow-up in two months.          No orders of the defined types were placed in this encounter.   All questions were answered. The patient knows to call the clinic with any problems, questions or concerns. The total time spent in the appointment was 30 minutes encounter with patients including review of chart and various tests results, discussions about plan of care and coordination of care plan   Artis Delay, MD 07/02/2023 11:48 AM  REVIEW OF SYSTEMS:   Constitutional: Denies fevers, chills or abnormal weight loss Eyes: Denies blurriness of vision Ears, nose, mouth, throat, and face: Denies mucositis or sore throat Respiratory: Denies cough, dyspnea or wheezes Cardiovascular: Denies palpitation, chest discomfort or lower extremity swelling Gastrointestinal:  Denies nausea, heartburn or change in bowel habits Skin: Denies abnormal skin rashes Lymphatics: Denies new lymphadenopathy or easy bruising Behavioral/Psych: Mood is stable, no new changes  All other systems were reviewed with the patient and are negative.  I have  reviewed the past medical history, past surgical history, social history and family history with the patient and they are unchanged from previous note.  ALLERGIES:  has No Known Allergies.  MEDICATIONS:  Current Outpatient Medications  Medication Sig Dispense Refill   acyclovir (ZOVIRAX) 400 MG tablet TAKE 1 TABLET(400 MG) BY MOUTH DAILY 90 tablet 1   atorvastatin (LIPITOR) 20 MG tablet Take 1 tablet (20 mg total) by mouth daily. TAKE 1 TABLET(20 MG) BY MOUTH DAILY 90 tablet 0   calcium carbonate (TUMS - DOSED IN MG ELEMENTAL CALCIUM) 500 MG chewable tablet Chew 1 tablet by mouth 2 (two) times daily.     Cholecalciferol (VITAMIN D) 50 MCG (2000 UT) tablet Take 2,000 Units by mouth daily.     diclofenac Sodium (VOLTAREN) 1 % GEL Apply 2 g topically 4 (four) times daily. Apply to knee 50 g 2   empagliflozin (JARDIANCE) 10 MG TABS tablet Take 1 tablet (10 mg total) by mouth daily. 30 tablet 3   folic acid (FOLVITE) 400 MCG tablet Take 400 mcg by mouth daily.     furosemide (LASIX) 40 MG tablet Take 1 tablet (40 mg total) by mouth 2 (two) times daily. 60 tablet 3   ixazomib citrate (NINLARO) 3 MG capsule Take 1 capsule (3 mg) by mouth weekly, 3 weeks on, 1 week off, repeat every 4 weeks. Take on an empty stomach 1hr before or 2hr after meals. 3 capsule 11   losartan (COZAAR) 100 MG tablet Take 1 tablet (100 mg total) by mouth daily. 30 tablet 11   Multiple Vitamin (MULTI-VITAMIN) tablet Take 1 tablet by mouth daily.     oxyCODONE (OXY IR/ROXICODONE) 5 MG immediate release tablet Take 1 tablet (5 mg total) by mouth every 4 (four) hours as needed for severe pain. 60 tablet 0   Polyethyl Glycol-Propyl Glycol (SYSTANE OP) Place 1 drop into both eyes daily as needed (dry eyes).     pomalidomide (POMALYST) 3 MG capsule Take 1 capsule (3mg ) by mouth daily for 21 days, rest 7 days, repeat every 28 days. 21 capsule 0   primidone (MYSOLINE) 50 MG tablet Take 1 tablet (50 mg total) by mouth at bedtime. 30  tablet 2   prochlorperazine (COMPAZINE) 10 MG tablet TAKE 1 TABLET(10 MG) BY MOUTH EVERY 6 HOURS AS NEEDED FOR NAUSEA OR VOMITING 60 tablet 1   rivaroxaban (XARELTO) 10 MG TABS tablet Take 1 tablet (10 mg total) by mouth daily with supper. 90 tablet 1   vitamin B-12 (CYANOCOBALAMIN) 1000 MCG tablet Take 1,000 mcg by mouth daily.     No current facility-administered medications for this visit.    SUMMARY OF ONCOLOGIC HISTORY: Oncology History  Multiple myeloma without remission (HCC)  03/20/2019 Imaging   1. Tumor involving the L5 and S1 and S2 segments of the spine as described with mass effects upon the left L4, L5, S1 and more distal left sacral nerves as described above. 2. Does the patient have a history of malignancy? 3. Multiple plasmacytomas and metastatic disease could give this appearance   03/27/2019 Pathology Results   Soft Tissue Needle Core Biopsy, sacral mass - PLASMABLASTIC NEOPLASM. - SEE COMMENT. Microscopic Comment The sections show needle core biopsy fragments of soft tissue densely infiltrated by a relatively monomorphic infiltrate of atypical plasmacytoid cells characterized by vesicular or partially clumped chromatin and prominent nucleoli. This is associated with scattered mitosis. A battery of immunohistochemical stains was performed and show that the atypical plasmacytoid cells are  positive for CD138, CD43 and cytoplasmic kappa. There is also weak positivity for CD56 and partial variable positivity for cyclin D1. The atypical plasmacytoid cells are negative for cytoplasmic lambda, CD10, PAX5, CD79a, CD20, CD3, CD5, CD34, EBV (ISH) and mostly negative for LCA. The overall morphologic and histologic features are most compatible with a plasmablastic neoplasm. The differential diagnosis includes plasmablastic lymphoma and plasmablastic plasmacytoma/myeloma. Based on the overall phenotypic features and the clinical setting, plasmacytoma/myeloma is favored. Clinical correlation  and hematologic evaluation is recommended   03/27/2019 Procedure   Technically successful CT-guided biopsy of sacral soft tissue mass.   04/04/2019 Initial Diagnosis   Multiple myeloma without remission (HCC)   04/11/2019 PET scan   IMPRESSION: 1. Previously noted expansile lesions involving the L5 vertebra and sacrum are again noted and exhibit intense FDG uptake compatible with metabolically active disease. A third focus of increased uptake without corresponding CT abnormality is noted within the intertrochanteric portions of the proximal right femur. 2. Small lucent lesion within the T3 vertebra is noted without corresponding FDG uptake. 3. Asymmetric left bladder wall thickening, etiology indeterminate. 4. Aortic Atherosclerosis (ICD10-I70.0). Lad coronary artery calcification.   04/12/2019 Cancer Staging   Staging form: Plasma Cell Myeloma and Plasma Cell Disorders, AJCC 8th Edition - Clinical stage from 04/12/2019: Beta-2-microglobulin (mg/L): 2, Albumin (g/dL): 3.6, ISS: Stage I, High-risk cytogenetics: Unknown, LDH: Unknown - Signed by Artis Delay, MD on 04/12/2019   04/14/2019 Bone Marrow Biopsy   Bone Marrow, Aspirate,Biopsy, and Clot, right iliac bone - PLASMA CELL MYELOMA.   04/17/2019 - 07/11/2019 Chemotherapy   The patient had bortezemib, revlimid and dexamethasone for chemotherapy treatment.     08/17/2019 Bone Marrow Transplant   He received high dose melphalan followed by autologous stem cell transplant at [facility]   11/22/2019 Bone Marrow Biopsy   BONE MARROW biopsy at [facility]:       Mild monoclonal plasmacytosis (approximately 2%), kappa light chain restricted.    11/22/2019 PET scan   PET CT at [facility] 1.  Similar size and appearance of mildly hypermetabolic lytic lesion in the L5 vertebral body and posterior elements.  2.  Otherwise, no substantial FDG uptake compared to background bone marrow in the additional osseous lucent lesions.    12/20/2019 - 05/09/2020  Radiation Therapy   Radiation Treatment Dates: 12/20/2019 through 01/08/2020 Site Technique Total Dose (Gy) Dose per Fx (Gy) Completed Fx Beam Energies  Lumbar Spine: Spine 3D 35/35 2.5 14/14 15X        02/29/2020 PET scan   1. Similar size and appearance of expansile lytic lesion in the L5 vertebral body and posterior elements with minimal FDG activity.  2. Otherwise, no additional osseous lucent lesions with FDG uptake above background bone marrow   05/31/2020 -  Chemotherapy   The patient had Revlimid for chemotherapy treatment.     11/25/2021 - 05/11/2022 Chemotherapy   Patient is on Treatment Plan : MYELOMA RELAPSED / REFRACTORY Daratumumab SQ + Bortezomib + Dexamethasone (DaraVd) q21d / Daratumumab SQ q28d      11/25/2021 - 10/29/2022 Chemotherapy   Patient is on Treatment Plan : MYELOMA RELAPSED / REFRACTORY Daratumumab SQ + Bortezomib + Dexamethasone (DaraVd) q21d / Daratumumab SQ q28d        PHYSICAL EXAMINATION: ECOG PERFORMANCE STATUS: 1 - Symptomatic but completely ambulatory  Vitals:   07/02/23 1142  BP: 129/75  Pulse: 64  Resp: 18  Temp: 97.6 F (36.4 C)  SpO2: 97%   Filed Weights  07/02/23 1142  Weight: 238 lb 3.2 oz (108 kg)    GENERAL:alert, no distress and comfortable   LABORATORY DATA:  I have reviewed the data as listed    Component Value Date/Time   NA 139 06/01/2023 1115   NA 139 04/05/2014 0849   K 3.6 06/01/2023 1115   K 4.4 04/05/2014 0849   CL 97 06/01/2023 1115   CO2 26 06/01/2023 1115   CO2 23 04/05/2014 0849   GLUCOSE 93 06/01/2023 1115   GLUCOSE 102 (H) 05/23/2023 0412   GLUCOSE 109 04/05/2014 0849   BUN 26 06/01/2023 1115   BUN 16.8 04/05/2014 0849   CREATININE 0.98 06/01/2023 1115   CREATININE 0.80 10/29/2022 1046   CREATININE 0.91 07/29/2017 1004   CREATININE 0.9 04/05/2014 0849   CALCIUM 9.0 06/01/2023 1115   CALCIUM 8.7 04/05/2014 0849   PROT 6.3 06/01/2023 1115   PROT 6.7 04/05/2014 0849   ALBUMIN 4.1 06/01/2023 1115    ALBUMIN 3.8 04/05/2014 0849   AST 25 06/01/2023 1115   AST 24 10/29/2022 1046   AST 26 04/05/2014 0849   ALT 45 (H) 06/01/2023 1115   ALT 31 10/29/2022 1046   ALT 30 04/05/2014 0849   ALKPHOS 76 06/01/2023 1115   ALKPHOS 75 04/05/2014 0849   BILITOT 0.9 06/01/2023 1115   BILITOT 0.9 10/29/2022 1046   BILITOT 0.71 04/05/2014 0849   GFRNONAA >60 05/23/2023 0412   GFRNONAA >60 10/29/2022 1046   GFRAA >60 06/14/2020 0830    No results found for: "SPEP", "UPEP"  Lab Results  Component Value Date   WBC 4.6 07/02/2023   NEUTROABS 1.7 07/02/2023   HGB 14.7 07/02/2023   HCT 43.7 07/02/2023   MCV 101.4 (H) 07/02/2023   PLT 223 07/02/2023      Chemistry      Component Value Date/Time   NA 139 06/01/2023 1115   NA 139 04/05/2014 0849   K 3.6 06/01/2023 1115   K 4.4 04/05/2014 0849   CL 97 06/01/2023 1115   CO2 26 06/01/2023 1115   CO2 23 04/05/2014 0849   BUN 26 06/01/2023 1115   BUN 16.8 04/05/2014 0849   CREATININE 0.98 06/01/2023 1115   CREATININE 0.80 10/29/2022 1046   CREATININE 0.91 07/29/2017 1004   CREATININE 0.9 04/05/2014 0849   GLU 10 04/10/2014 0000      Component Value Date/Time   CALCIUM 9.0 06/01/2023 1115   CALCIUM 8.7 04/05/2014 0849   ALKPHOS 76 06/01/2023 1115   ALKPHOS 75 04/05/2014 0849   AST 25 06/01/2023 1115   AST 24 10/29/2022 1046   AST 26 04/05/2014 0849   ALT 45 (H) 06/01/2023 1115   ALT 31 10/29/2022 1046   ALT 30 04/05/2014 0849   BILITOT 0.9 06/01/2023 1115   BILITOT 0.9 10/29/2022 1046   BILITOT 0.71 04/05/2014 0849       RADIOGRAPHIC STUDIES: I have personally reviewed the radiological images as listed and agreed with the findings in the report. MR LUMBAR SPINE W WO CONTRAST  Result Date: 06/19/2023 CLINICAL DATA:  Brain/CNS neoplasm.  Multiple myeloma. EXAM: MRI LUMBAR SPINE WITHOUT AND WITH CONTRAST TECHNIQUE: Multiplanar and multiecho pulse sequences of the lumbar spine were obtained without and with intravenous contrast.  CONTRAST:  10mL GADAVIST GADOBUTROL 1 MMOL/ML IV SOLN COMPARISON:  MRI of the lumbar spine without and with contrast 10/28/2020 FINDINGS: Segmentation: 5 non rib-bearing lumbar type vertebral bodies are present. The lowest fully formed vertebral body is L5. Alignment: Slight retrolisthesis is  at L1-2, L2-3 and L3-4 is stable. Vertebrae: Cystic enhancing scratched at treated cystic enhancing lesions on the left L5 and S2 are stable. A new lesion along the inferior endplate of L2 likely represents a Schmorl's node. Focal signal abnormality enhancement is present at the tip of the L1 spinous process, concerning for a new myelomatous lesion. No other focal marrow signal changes are present to suggest other acute disease. Mild progressive type 1 Modic changes are present at L3-4, degenerative. Conus medullaris and cauda equina: Conus extends to the L1 level. Conus and cauda equina appear normal. Paraspinal and other soft tissues: Limited imaging the abdomen is unremarkable. There is no significant adenopathy. No solid organ lesions are present. Disc levels: T12-L1: Negative. L1-2: Negative. L2-3: Moderate facet hypertrophy has progressed bilaterally. Moderate right and mild left foraminal stenosis has progressed slightly. L3-4: A broad-based disc protrusion is again noted. Minimal inferior extrusion is present with right greater than left subarticular stenosis. Moderate foraminal narrowing has progressed bilaterally. L4-5: A far left lateral disc protrusion extends into the neural foramen. Mild left subarticular narrowing is new. Mild foraminal narrowing is present bilaterally. L5-S1: A broad-based disc protrusion extends into the foramina bilaterally. Mild right and mild left foraminal stenosis is stable. IMPRESSION: 1. Focal signal abnormality and enhancement at the tip of the L1 spinous process, concerning for a new myelomatous lesion. 2. Stable cystic enhancing lesions on the left at L5 and S2. 3. New lesion along  the inferior endplate of L2 likely represents a progressive Schmorl's node rather than a new myelomatous lesion. 4. Progressive multilevel spondylosis of the lumbar spine as described. 5. Moderate right and mild left foraminal stenosis at L2-3 has progressed slightly. 6. Moderate foraminal narrowing bilaterally at L3-4 has progressed. 7. Mild left subarticular narrowing at L4-5 is new. 8. Mild foraminal narrowing bilaterally at L4-5 is stable. 9. Mild right and mild left foraminal stenosis at L5-S1 is stable. Electronically Signed   By: Marin Roberts M.D.   On: 06/19/2023 13:03

## 2023-07-02 NOTE — Patient Instructions (Signed)

## 2023-07-05 ENCOUNTER — Telehealth: Payer: Self-pay | Admitting: *Deleted

## 2023-07-05 LAB — KAPPA/LAMBDA LIGHT CHAINS
Kappa free light chain: 32 mg/L — ABNORMAL HIGH (ref 3.3–19.4)
Kappa, lambda light chain ratio: 1.66 — ABNORMAL HIGH (ref 0.26–1.65)
Lambda free light chains: 19.3 mg/L (ref 5.7–26.3)

## 2023-07-05 NOTE — Telephone Encounter (Signed)
Patient left voice mail - He wants to know if should get flu shot and/or RSV?  Contacted patient to provide following results/info per Dr. Bertis Ruddy: Pls let him know ferritin is normal. No need for phlbeotomy  He should get flu shot, no need for RSV  Patient verbalized understanding of all information.

## 2023-07-06 ENCOUNTER — Ambulatory Visit: Payer: Medicare Other | Attending: Hematology and Oncology

## 2023-07-06 DIAGNOSIS — R2689 Other abnormalities of gait and mobility: Secondary | ICD-10-CM

## 2023-07-06 DIAGNOSIS — R252 Cramp and spasm: Secondary | ICD-10-CM

## 2023-07-06 DIAGNOSIS — R262 Difficulty in walking, not elsewhere classified: Secondary | ICD-10-CM

## 2023-07-06 DIAGNOSIS — M6281 Muscle weakness (generalized): Secondary | ICD-10-CM | POA: Diagnosis not present

## 2023-07-06 DIAGNOSIS — R293 Abnormal posture: Secondary | ICD-10-CM

## 2023-07-06 LAB — MULTIPLE MYELOMA PANEL, SERUM
Albumin SerPl Elph-Mcnc: 3.5 g/dL (ref 2.9–4.4)
Albumin/Glob SerPl: 1.3 (ref 0.7–1.7)
Alpha 1: 0.2 g/dL (ref 0.0–0.4)
Alpha2 Glob SerPl Elph-Mcnc: 0.7 g/dL (ref 0.4–1.0)
B-Globulin SerPl Elph-Mcnc: 0.8 g/dL (ref 0.7–1.3)
Gamma Glob SerPl Elph-Mcnc: 0.9 g/dL (ref 0.4–1.8)
Globulin, Total: 2.7 g/dL (ref 2.2–3.9)
IgA: 87 mg/dL (ref 61–437)
IgG (Immunoglobin G), Serum: 957 mg/dL (ref 603–1613)
IgM (Immunoglobulin M), Srm: 44 mg/dL (ref 15–143)
M Protein SerPl Elph-Mcnc: 0.3 g/dL — ABNORMAL HIGH
Total Protein ELP: 6.2 g/dL (ref 6.0–8.5)

## 2023-07-06 NOTE — Therapy (Signed)
OUTPATIENT PHYSICAL THERAPY TREATMENT  Patient Name: Jesse Mcclure. MRN: [id number] DOB:Feb 17, 1951, 72 y.o., adult Today's Date: 07/06/2023   PCP: Sharlot Gowda MD REFERRING PROVIDER: Artis Delay MD  END OF SESSION:  PT End of Session - 07/06/23 1019     Visit Number 8    Date for PT Re-Evaluation 08/19/23    Authorization Type Medicare    Progress Note Due on Visit 10    PT Start Time 1015    PT Stop Time 1100    PT Time Calculation (min) 45 min    Activity Tolerance Patient tolerated treatment well    Behavior During Therapy WFL for tasks assessed/performed               Past Medical History:  Diagnosis Date   BPH (benign prostatic hypertrophy)    Degenerative disc disease    Diverticulosis    Hemochromatosis    HTN (hypertension)    Hyperlipidemia    Multiple myeloma (HCC)    Polio    Status post Nissen fundoplication (without gastrostomy tube) procedure    for hiatal hernia.   Past Surgical History:  Procedure Laterality Date   FOOT SURGERY     HIATAL HERNIA REPAIR     OTHER SURGICAL HISTORY     Cataract Surgery--Both eyes   SKIN BIOPSY Right 04/13/2022   squamous cell carcinoma in stiu arising in actinic keratosis   TOTAL KNEE ARTHROPLASTY Left 07/15/2021   Procedure: TOTAL KNEE ARTHROPLASTY;  Surgeon: Teryl Lucy, MD;  Location: [facility];  Service: Orthopedics;  Laterality: Left;   Undescended testes Right 10/05/1966   Patient Active Problem List   Diagnosis Date Noted   Snoring 06/08/2023   Chronic midline low back pain without sciatica 06/01/2023   Idiopathic gout 06/01/2023   (HFpEF) heart failure with preserved ejection fraction (HCC) 05/17/2023   Shortness of breath 05/16/2023   Essential tremor 05/11/2023   Balance disorder 04/23/2023   Squamous cell carcinoma in situ 04/14/2023   Melanoma in situ (HCC) 04/14/2023   History of pulmonary embolus (PE) 09/04/2022   History of basal cell cancer 05/01/2022   First degree AV block  04/22/2022   History of skin cancer 04/21/2022   S/P TKR (total knee replacement), left 07/15/2021   Osteoarthritis of left knee 05/22/2021   Deficiency anemia 01/07/2021   ACE-inhibitor cough 12/16/2020   Tremor of both hands 10/11/2019   Peripheral neuropathy due to chemotherapy (HCC) 10/11/2019   Dry skin 10/11/2019   Insomnia disorder 09/06/2019   Loose stools 09/06/2019   S/P bone marrow transplant (HCC) 08/30/2019   Hypocalcemia 06/14/2019   Multiple myeloma without remission (HCC) 04/04/2019   Other constipation 04/04/2019   Glucose intolerance (impaired glucose tolerance) 05/20/2016   Vitamin D deficiency 05/20/2016   Chronic musculoskeletal pain 01/09/2016   History of orchiectomy, unilateral 05/20/2015   Obesity (BMI 30-39.9) 05/20/2015   Elevated PSA 05/20/2015   Arthritis 05/20/2015   Essential hypertension 03/02/2012   Hyperlipidemia 03/02/2012   Hemochromatosis, hereditary (HCC) 11/28/2008    ONSET DATE: > 1 year  REFERRING DIAG: G25.2 intention tremor; R26.89 balance disorder  THERAPY DIAG: difficulty in walking; fall risk Muscle weakness (generalized)  Difficulty in walking, not elsewhere classified  Balance disorder  Cramp and spasm  Abnormal posture  Rationale for Evaluation and Treatment: Rehabilitation  SUBJECTIVE:  SUBJECTIVE STATEMENT: "I feel pretty good considering my infusion the other day".  "A little achy"   Eval: Being treated for cancer 4-5 years including oral chemo at home;  Dr wants me to walk and I use poles b/c I'm very wobbly.  Dr. says it's not the medication causing this.  Went on cruise in [place] a few months ago but I couldn't tour and stayed on the boat. No falls but several close calls: 3-4 steps garage steps into the house, stumbled going up.   If I sit a while then get up and take a few steps, will feel wobbly.  Hold onto something to pick up something from the floor. Denies loss of sensation in feet. I sleep a lot.  I'm very sedentary.   My partner thinks I need to move more.  I ride the stationary bike Tuesday/Thursday occasionally.  I used to have a trainer prior to getting sick. I get wheelchair service at airports. [place] trip in May  Goes by Jesse Mcclure  PERTINENT HISTORY:  Oncology History  Multiple myeloma without remission (HCC)  03/20/2019 Imaging    1. Tumor involving the L5 and S1 and S2 segments of the spine as described with mass effects upon the left L4, L5, S1 and more distal left sacral nerves   Left TKR HTN Back pain  PAIN:  Are you having pain? Yes  NPRS scale:  7/10 took an oxy this morning Pain location: LBP   Aggravating factors: standing in the kitchen Relieving factors: ibuprofen, oxy if it gets bad   PRECAUTIONS: Fall    WEIGHT BEARING RESTRICTIONS: No  FALLS: Has patient fallen in last 6 months? No  LIVING ENVIRONMENT: Lives with: partner Lives in: House/apartment 2 story main living downstairs Stairs: Yes: Internal: 10 steps; on right going up and External: 3 steps; on left going up Has following equipment at home: walking poles   PLOF: partner does cooking, pt does some grilling Enjoys being with friends, hanging out; doesn't go to social events  b/c I can't stand for long time 10 minutes  PATIENT GOALS: I want to walk better, stand longer; going to [place] in May for 11 days; ride bicycles there;  I'd like to not have to use wheelchair service at the airport  The Patient-Specific Functional Scale  Initial:  I am going to ask you to identify up to 3 important activities that you are unable to do or are having difficulty with as a result of this problem.  Today are there any activities that you are unable to do or having difficulty with because of this?  (Patient shown scale and patient  rated each activity)  Follow up: When you first came in you had difficulty performing these activities.  Today do you still have difficulty?  Patient-Specific activity scoring scheme (Point to one number):  0 1 2 3 4 5 6 7 8 9  10 Unable  Able to perform To perform                                                                                                    activity at the same Activity         Level as before                                                                                                                       Injury or problem  Activity     Standing for parties                                    Initial:       4                2.        Going to [employer] show                              Initial:    3                      OBJECTIVE:    COGNITION: Overall cognitive status: Within functional limits for tasks assessed    LOWER EXTREMITY ROM:   grossly WFLs  LOWER EXTREMITY MMT:    MMT Right Eval Left Eval  Hip flexion    Hip extension    Hip abduction 4 4  Hip adduction    Hip internal rotation    Hip external rotation    Knee flexion    Knee extension 4 4  Ankle dorsiflexion 4 4  Ankle plantarflexion 4 4  Ankle inversion    Ankle eversion    (Blank rows = not tested)           TRUNK STRENGTH:  Decreased activation of transverse abdominus muscles; abdominals 4-/5; decreased activation of lumbar multifidi; trunk extensors 4-/5                                  GAIT: Comments: wider base of support; decreased step length; decreased dorsiflexion  FUNCTIONAL TESTS:  5x sit to stand: 20.31 no hands TUG 12.11 sec no hands 2 min walk test 366 feet RPE 7/10 BERG balance test:  43/56 indicating a 50% risk of falls; most challenged by single limb standing or narrow base of support; decreased speed  of movement, limited forward reach  9/19: 5 sit to  stand 12.33 no hands TUG 11.61 no hands BERG: 49/56 Dynamic Gait Index: 18/24 difficulty with head turns and head extension    TODAY'S TREATMENT:       DATE:  07/06/23 NuStep L 1 x 5 min (blue machine) while discussing status  Seated red power rows/trunk extension 20x Seated chair sit up holding pair of 3#     2 sets of 10x (2nd set with overhead press) Sit to stands holding pair of 3#: 2 sets of 5 (verbal cues for weight shift fwd) O2 checked: 96% immediately after, dropped to 93% then back up to 96% with proper breathing Seated dead lift with red power cord under feet 2 x 10 Seated hip flexion up and over dumbbell on floor with 5# KB resting on thigh x 5 each LE (patient with extreme difficulty with this and fatigued very easily) Seated red power cord press out 10x 2 sets Seated blue band rows 20x  Standing alternating lateral cone step (did hurdle instead today due to slightly lower profile) overs 5x  2 sets (slightly less fatigue today) O2 checked again, similar pattern, rebounds quickly with proper breathing: educated on slow long breaths in through nose and long slow breaths out through pursed lips to avoid hyperventilation.   DATE:  06/29/23 NuStep L 1 x 5 min (blue machine) while discussing status  Seated red power rows/trunk extension 20x Standing alternating lateral cone step overs 5x (fatigues quickly) Seated chair sit up holding pair of 3#     2 sets of 8x (2nd set with overhead press) Sit to stands holding pair of 3#: 1 sets of 5 Seated dead lift with red power cord under feet 12x Seated hip flexion up and over dumbbell on floor with 5# KB resting on thigh O2 94% HR 84  RPE 8/10 Seated red power cord press out 10x  Seated blue band rows 20x  06/29/23 NuStep L 3 x 6 min (blue machine) while discussing status 97% HR 65 Heel and toe raises 10x Seated red power rows/trunk extension 20x Sit to stands: 2 sets of 5 Railing push ups:   2 sets of 8 Dead lift with   5  # KB   to tall cone between ankles:  10x UE pull:  green band row 15x 6 inch step ups: 2 sets of 5  with 2 arm support of railing Abdominals:  chair sit ups with 3# dumbbells with overhead press Blue band rows standing 10x  Dynamic Balance with cognitive layer: reaching letters and numbers overhead, lateral movements 95%  HR 97  RPE 5/10 2:15 minute walk: 415 feet               PATIENT EDUCATION: Education details: educated on slow long breaths in through nose and long slow breaths out through pursed lips to avoid hyperventilation.  Person educated: Patient Education method: Explanation Education comprehension: verbalized understanding  HOME EXERCISE PROGRAM: To be started  GOALS: Goals reviewed with patient? Yes  SHORT TERM GOALS: Target date: 05/27/2023   The patient will demonstrate knowledge of basic self care strategies and exercises to promote strength and mobility  Baseline: Goal status: met 9/19  2.  Able to walk 2 min 375 feet with RPE 5/10 Baseline:  Goal status: ongoing  3.  The patient will have an improved BERG balance score to   46 /56 indicating reduced risk of falls  Baseline:  Goal status: met 9/19  4.  Improved LE strength and balance  indicated by improved 5x STS to 16 sec Baseline:  Goal status: met 9/19  5.  Set DGI for MiniBESTest goal Baseline:  Goal status: met 9/19    LONG TERM GOALS: Target date: 08/19/2023   The patient will be independent in a safe self progression of a home exercise program/gym program to promote further recovery of function  Baseline:  Goal status: ongoing  2.  The patient will have improved gait stamina and speed needed to ambulate 600 feet in 6 minutes with RPE of 6/10  Baseline:  Goal status: =ongoing  3.  The patient will have an improved BERG balance score to  48  /56 indicating reduced risk of falls  Baseline:  Goal status: met 9/16  4.  The patient will have improved Timed Up and Go (TUG) time to    <12    sec indicating improved gait speed and LE strength  Baseline:  Goal status: met 9/19  5.  The patient will have improved hip and knee strength to at least 4+/5 needed for standing >10 min for washing dishes Baseline:  Goal status: ongoing  6. Improved balance with DGI to 21/24 indicating decreased risk of falls  7. Crowds at [facility] and walk to car/parking lot with PSFS to 5  8. Standing for 3 minutes for parties for socializing with PSFS of 6   ASSESSMENT:  CLINICAL IMPRESSION: Jesse Mcclure was able to do more reps on several exercises with only minimal fatigue.  However, he fatigues very quickly with any hip flexion exercise.  He should continue to well. He would benefit from skilled PT for LE strengthening and proximal hip stability along with balance and core strength.     OBJECTIVE IMPAIRMENTS: cardiopulmonary status limiting activity, decreased activity tolerance, decreased balance, decreased endurance, decreased mobility, difficulty walking, decreased strength, impaired perceived functional ability, and pain.   ACTIVITY LIMITATIONS: carrying, lifting, bending, standing, squatting, and locomotion level  PARTICIPATION LIMITATIONS: meal prep, cleaning, interpersonal relationship, shopping, and community activity  PERSONAL FACTORS: Fitness, Time since onset of injury/illness/exacerbation, and 3+ comorbidities: multiple myeloma, HTN, DDD lumbar spine  are also affecting patient's functional outcome.   REHAB POTENTIAL: Good  CLINICAL DECISION MAKING: Evolving/moderate complexity  EVALUATION COMPLEXITY: Moderate  PLAN:  PT FREQUENCY: 2x/week  PT DURATION: 8 weeks  PLANNED INTERVENTIONS: Therapeutic exercises, Therapeutic activity, Neuromuscular re-education, Balance training, Gait training, Patient/Family education, Self Care, Stair training, Aquatic Therapy, Cryotherapy, Moist heat, Manual therapy, and Re-evaluation  PLAN FOR NEXT SESSION:  general strengthening; single leg and  narrow base of support balance; dynamic gait with head turns; endurance  Brice Kossman B. Kaelem Brach, PT 07/06/23 12:14 PM [facility] [address] [place], [place] [address] Phone # [phone] Fax [phone]

## 2023-07-08 ENCOUNTER — Encounter: Payer: Medicare Other | Admitting: Physical Therapy

## 2023-07-12 ENCOUNTER — Telehealth: Payer: Self-pay

## 2023-07-12 ENCOUNTER — Other Ambulatory Visit: Payer: Self-pay | Admitting: Hematology and Oncology

## 2023-07-12 ENCOUNTER — Other Ambulatory Visit (HOSPITAL_COMMUNITY): Payer: Self-pay

## 2023-07-12 ENCOUNTER — Encounter: Payer: Self-pay | Admitting: Hematology and Oncology

## 2023-07-12 MED ORDER — OXYCODONE HCL 5 MG PO TABS
5.0000 mg | ORAL_TABLET | ORAL | 0 refills | Status: DC | PRN
Start: 2023-07-12 — End: 2023-08-03
  Filled 2023-07-12: qty 60, 10d supply, fill #0

## 2023-07-12 NOTE — Telephone Encounter (Signed)
Called and told Rx sent to pharmacy. He is aware.

## 2023-07-12 NOTE — Telephone Encounter (Signed)
He called and left a message requesting Oxycodone  refill to [facility].

## 2023-07-12 NOTE — Telephone Encounter (Signed)
done

## 2023-07-13 ENCOUNTER — Ambulatory Visit: Payer: Medicare Other

## 2023-07-13 DIAGNOSIS — R2689 Other abnormalities of gait and mobility: Secondary | ICD-10-CM | POA: Diagnosis not present

## 2023-07-13 DIAGNOSIS — R252 Cramp and spasm: Secondary | ICD-10-CM | POA: Diagnosis not present

## 2023-07-13 DIAGNOSIS — R262 Difficulty in walking, not elsewhere classified: Secondary | ICD-10-CM

## 2023-07-13 DIAGNOSIS — R293 Abnormal posture: Secondary | ICD-10-CM

## 2023-07-13 DIAGNOSIS — M6281 Muscle weakness (generalized): Secondary | ICD-10-CM | POA: Diagnosis not present

## 2023-07-13 NOTE — Therapy (Signed)
OUTPATIENT PHYSICAL THERAPY TREATMENT  Patient Name: Jesse Mcclure. MRN: [id number] DOB:[birth year], 72 y.o., adult Today's Date: 07/13/2023   PCP: Sharlot Gowda MD REFERRING PROVIDER: Artis Delay MD  END OF SESSION:  PT End of Session - 07/13/23 0931     Visit Number 9    Date for PT Re-Evaluation 08/19/23    Authorization Type Medicare    Progress Note Due on Visit 10    PT Start Time 0931    PT Stop Time 1007    PT Time Calculation (min) 36 min    Activity Tolerance Patient tolerated treatment well    Behavior During Therapy WFL for tasks assessed/performed               Past Medical History:  Diagnosis Date   BPH (benign prostatic hypertrophy)    Degenerative disc disease    Diverticulosis    Hemochromatosis    HTN (hypertension)    Hyperlipidemia    Multiple myeloma (HCC)    Polio    Status post Nissen fundoplication (without gastrostomy tube) procedure    for hiatal hernia.   Past Surgical History:  Procedure Laterality Date   FOOT SURGERY     HIATAL HERNIA REPAIR     OTHER SURGICAL HISTORY     Cataract Surgery--Both eyes   SKIN BIOPSY Right 04/13/2022   squamous cell carcinoma in stiu arising in actinic keratosis   TOTAL KNEE ARTHROPLASTY Left 07/15/2021   Procedure: TOTAL KNEE ARTHROPLASTY;  Surgeon: Teryl Lucy, MD;  Location: [facility];  Service: Orthopedics;  Laterality: Left;   Undescended testes Right 10/05/1966   Patient Active Problem List   Diagnosis Date Noted   Snoring 06/08/2023   Chronic midline low back pain without sciatica 06/01/2023   Idiopathic gout 06/01/2023   (HFpEF) heart failure with preserved ejection fraction (HCC) 05/17/2023   Shortness of breath 05/16/2023   Essential tremor 05/11/2023   Balance disorder 04/23/2023   Squamous cell carcinoma in situ 04/14/2023   Melanoma in situ (HCC) 04/14/2023   History of pulmonary embolus (PE) 09/04/2022   History of basal cell cancer 05/01/2022   First degree AV block  04/22/2022   History of skin cancer 04/21/2022   S/P TKR (total knee replacement), left 07/15/2021   Osteoarthritis of left knee 05/22/2021   Deficiency anemia 01/07/2021   ACE-inhibitor cough 12/16/2020   Tremor of both hands 10/11/2019   Peripheral neuropathy due to chemotherapy (HCC) 10/11/2019   Dry skin 10/11/2019   Insomnia disorder 09/06/2019   Loose stools 09/06/2019   S/P bone marrow transplant (HCC) 08/30/2019   Hypocalcemia 06/14/2019   Multiple myeloma without remission (HCC) 04/04/2019   Other constipation 04/04/2019   Glucose intolerance (impaired glucose tolerance) 05/20/2016   Vitamin D deficiency 05/20/2016   Chronic musculoskeletal pain 01/09/2016   History of orchiectomy, unilateral 05/20/2015   Obesity (BMI 30-39.9) 05/20/2015   Elevated PSA 05/20/2015   Arthritis 05/20/2015   Essential hypertension 03/02/2012   Hyperlipidemia 03/02/2012   Hemochromatosis, hereditary (HCC) 11/28/2008    ONSET DATE: > 1 year  REFERRING DIAG: G25.2 intention tremor; R26.89 balance disorder  THERAPY DIAG: difficulty in walking; fall risk Muscle weakness (generalized)  Difficulty in walking, not elsewhere classified  Balance disorder  Cramp and spasm  Abnormal posture  Rationale for Evaluation and Treatment: Rehabilitation  SUBJECTIVE:  SUBJECTIVE STATEMENT: "I feel good today"  Pain is mild around 4/10.     Eval: Being treated for cancer 4-5 years including oral chemo at home;  Dr wants me to walk and I use poles b/c I'm very wobbly.  Dr. says it's not the medication causing this.  Went on cruise in [place] a few months ago but I couldn't tour and stayed on the boat. No falls but several close calls: 3-4 steps garage steps into the house, stumbled going up.  If I sit a while then get  up and take a few steps, will feel wobbly.  Hold onto something to pick up something from the floor. Denies loss of sensation in feet. I sleep a lot.  I'm very sedentary.   My partner thinks I need to move more.  I ride the stationary bike Tuesday/Thursday occasionally.  I used to have a trainer prior to getting sick. I get wheelchair service at airports. [place] trip in May  Goes by [name]  PERTINENT HISTORY:  Oncology History  Multiple myeloma without remission (HCC)  03/20/2019 Imaging    1. Tumor involving the L5 and S1 and S2 segments of the spine as described with mass effects upon the left L4, L5, S1 and more distal left sacral nerves   Left TKR HTN Back pain  PAIN:  Are you having pain? Yes  NPRS scale:  7/10 took an oxy this morning Pain location: LBP   Aggravating factors: standing in the kitchen Relieving factors: ibuprofen, oxy if it gets bad   PRECAUTIONS: Fall    WEIGHT BEARING RESTRICTIONS: No  FALLS: Has patient fallen in last 6 months? No  LIVING ENVIRONMENT: Lives with: partner Lives in: House/apartment 2 story main living downstairs Stairs: Yes: Internal: 10 steps; on right going up and External: 3 steps; on left going up Has following equipment at home: walking poles   PLOF: partner does cooking, pt does some grilling Enjoys being with friends, hanging out; doesn't go to social events  b/c I can't stand for long time 10 minutes  PATIENT GOALS: I want to walk better, stand longer; going to [place] in May for 11 days; ride bicycles there;  I'd like to not have to use wheelchair service at the airport  The Patient-Specific Functional Scale  Initial:  I am going to ask you to identify up to 3 important activities that you are unable to do or are having difficulty with as a result of this problem.  Today are there any activities that you are unable to do or having difficulty with because of this?  (Patient shown scale and patient rated each  activity)  Follow up: When you first came in you had difficulty performing these activities.  Today do you still have difficulty?  Patient-Specific activity scoring scheme (Point to one number):  0 1 2 3 4 5 6 7 8 9  10 Unable  Able to perform To perform                                                                                                    activity at the same Activity         Level as before                                                                                                                       Injury or problem  Activity     Standing for parties                                    Initial:       4                2.        Going to [employer] show                              Initial:    3                      OBJECTIVE:    COGNITION: Overall cognitive status: Within functional limits for tasks assessed    LOWER EXTREMITY ROM:   grossly WFLs  LOWER EXTREMITY MMT:    MMT Right Eval Left Eval  Hip flexion    Hip extension    Hip abduction 4 4  Hip adduction    Hip internal rotation    Hip external rotation    Knee flexion    Knee extension 4 4  Ankle dorsiflexion 4 4  Ankle plantarflexion 4 4  Ankle inversion    Ankle eversion    (Blank rows = not tested)           TRUNK STRENGTH:  Decreased activation of transverse abdominus muscles; abdominals 4-/5; decreased activation of lumbar multifidi; trunk extensors 4-/5                                  GAIT: Comments: wider base of support; decreased step length; decreased dorsiflexion  FUNCTIONAL TESTS:  5x sit to stand: 20.31 no hands TUG 12.11 sec no hands 2 min walk test 366 feet RPE 7/10 BERG balance test:  43/56 indicating a 50% risk of falls; most challenged by single limb standing or narrow base of support; decreased speed  of movement, limited forward reach  9/19: 5 sit to stand 12.33  no hands TUG 11.61 no hands BERG: 49/56 Dynamic Gait Index: 18/24 difficulty with head turns and head extension    TODAY'S TREATMENT:       DATE:  07/13/23 NuStep L 1 x 5 min (green machine) while discussing status  Seated red power rows/trunk extension 20x Seated chair sit up holding pair of 3#     2 sets of 10x (2nd set with overhead press) Sit to stands holding pair of 3#: 2 sets of 10 O2 checked: 93% immediately after, held at 93% then back up to 95% within 10 sec with proper breathing Seated dead lift with red power cord under feet 2 x 10 Seated hip flexion up and over dumbbell on floor with 5# KB resting on thigh x 5 each LE (patient with extreme difficulty with this and fatigued very easily) Seated red power cord press out 10x 2 sets Seated blue band rows 20x  Lateral hurdle step overs 2 x 10 each LE at counter Walk for activity tolerance : able to do 2 min 30 sec and covered 480 feet  07/06/23 NuStep L 1 x 5 min (blue machine) while discussing status  Seated red power rows/trunk extension 20x Seated chair sit up holding pair of 3#     2 sets of 10x (2nd set with overhead press) Sit to stands holding pair of 3#: 2 sets of 5 (verbal cues for weight shift fwd) O2 checked: 96% immediately after, dropped to 93% then back up to 96% with proper breathing Seated dead lift with red power cord under feet 2 x 10 Seated hip flexion up and over dumbbell on floor with 5# KB resting on thigh x 5 each LE (patient with extreme difficulty with this and fatigued very easily) Seated red power cord press out 10x 2 sets Seated blue band rows 20x  Standing alternating lateral cone step (did hurdle instead today due to slightly lower profile) overs 5x  2 sets (slightly less fatigue today) O2 checked again, similar pattern, rebounds quickly with proper breathing: educated on slow long breaths in through nose and long slow breaths out through pursed lips to avoid hyperventilation.   DATE:   07/02/23 NuStep L 1 x 5 min (blue machine) while discussing status  Seated red power rows/trunk extension 20x Standing alternating lateral cone step overs 5x (fatigues quickly) Seated chair sit up holding pair of 3#     2 sets of 8x (2nd set with overhead press) Sit to stands holding pair of 3#: 1 sets of 5 Seated dead lift with red power cord under feet 12x Seated hip flexion up and over dumbbell on floor with 5# KB resting on thigh O2 94% HR 84  RPE 8/10 Seated red power cord press out 10x  Seated blue band rows 20x     PATIENT EDUCATION: Education details: educated on slow long breaths in through nose and long slow breaths out through pursed lips to avoid hyperventilation.  Person educated: Patient Education method: Explanation Education comprehension: verbalized understanding  HOME EXERCISE PROGRAM: To be started  GOALS: Goals reviewed with patient? Yes  SHORT TERM GOALS: Target date: 05/27/2023   The patient will demonstrate knowledge of basic self care strategies and exercises to promote strength and mobility  Baseline: Goal status: met 9/19  2.  Able to walk 2 min 375 feet with RPE 5/10 Baseline:  Goal status: ongoing  3.  The patient will have an  improved BERG balance score to   46 /56 indicating reduced risk of falls  Baseline:  Goal status: met 9/19  4.  Improved LE strength and balance  indicated by improved 5x STS to 16 sec Baseline:  Goal status: met 9/19  5.  Set DGI for MiniBESTest goal Baseline:  Goal status: met 9/19    LONG TERM GOALS: Target date: 08/19/2023   The patient will be independent in a safe self progression of a home exercise program/gym program to promote further recovery of function  Baseline:  Goal status: ongoing  2.  The patient will have improved gait stamina and speed needed to ambulate 600 feet in 6 minutes with RPE of 6/10  Baseline:  Goal status: =ongoing  3.  The patient will have an improved BERG balance score  to  48  /56 indicating reduced risk of falls  Baseline:  Goal status: met 9/16  4.  The patient will have improved Timed Up and Go (TUG) time to    <12   sec indicating improved gait speed and LE strength  Baseline:  Goal status: met 9/19  5.  The patient will have improved hip and knee strength to at least 4+/5 needed for standing >10 min for washing dishes Baseline:  Goal status: ongoing  6. Improved balance with DGI to 21/24 indicating decreased risk of falls  7. Crowds at [facility] and walk to car/parking lot with PSFS to 5  8. Standing for 3 minutes for parties for socializing with PSFS of 6   ASSESSMENT:  CLINICAL IMPRESSION: [name] was able to do 2 sets of 10 on sit to stand with good tolerance (O2 stayed at 93% or above with recovery).  He continues to fatigue very quickly with any hip flexion exercise.  However, his reps were with much improved technique.   He was able to walk an additional 15 sec today on his walk task and covered an additional 65 feet further than last attempt.  He should continue to well. He would benefit from skilled PT for LE strengthening and proximal hip stability along with balance and core strength.     OBJECTIVE IMPAIRMENTS: cardiopulmonary status limiting activity, decreased activity tolerance, decreased balance, decreased endurance, decreased mobility, difficulty walking, decreased strength, impaired perceived functional ability, and pain.   ACTIVITY LIMITATIONS: carrying, lifting, bending, standing, squatting, and locomotion level  PARTICIPATION LIMITATIONS: meal prep, cleaning, interpersonal relationship, shopping, and community activity  PERSONAL FACTORS: Fitness, Time since onset of injury/illness/exacerbation, and 3+ comorbidities: multiple myeloma, HTN, DDD lumbar spine  are also affecting patient's functional outcome.   REHAB POTENTIAL: Good  CLINICAL DECISION MAKING: Evolving/moderate complexity  EVALUATION COMPLEXITY:  Moderate  PLAN:  PT FREQUENCY: 2x/week  PT DURATION: 8 weeks  PLANNED INTERVENTIONS: Therapeutic exercises, Therapeutic activity, Neuromuscular re-education, Balance training, Gait training, Patient/Family education, Self Care, Stair training, Aquatic Therapy, Cryotherapy, Moist heat, Manual therapy, and Re-evaluation  PLAN FOR NEXT SESSION:  walk at end of each session (timed and for distance) attempting to increase distance and time each visit, general strengthening; single leg and narrow base of support balance; dynamic gait with head turns; endurance  Vielka Klinedinst B. Ishaq Maffei, PT 07/13/23 10:13 AM [facility] [address] [place], [place] [address] Phone # [phone] Fax [phone]

## 2023-07-15 ENCOUNTER — Other Ambulatory Visit: Payer: Self-pay | Admitting: Family Medicine

## 2023-07-15 ENCOUNTER — Ambulatory Visit: Payer: Medicare Other | Admitting: Physical Therapy

## 2023-07-15 DIAGNOSIS — I10 Essential (primary) hypertension: Secondary | ICD-10-CM

## 2023-07-16 DIAGNOSIS — Z23 Encounter for immunization: Secondary | ICD-10-CM | POA: Diagnosis not present

## 2023-07-20 ENCOUNTER — Ambulatory Visit: Payer: Medicare Other

## 2023-07-20 DIAGNOSIS — R2689 Other abnormalities of gait and mobility: Secondary | ICD-10-CM | POA: Diagnosis not present

## 2023-07-20 DIAGNOSIS — R293 Abnormal posture: Secondary | ICD-10-CM | POA: Diagnosis not present

## 2023-07-20 DIAGNOSIS — R252 Cramp and spasm: Secondary | ICD-10-CM

## 2023-07-20 DIAGNOSIS — M6281 Muscle weakness (generalized): Secondary | ICD-10-CM

## 2023-07-20 DIAGNOSIS — R262 Difficulty in walking, not elsewhere classified: Secondary | ICD-10-CM

## 2023-07-20 NOTE — Therapy (Signed)
OUTPATIENT PHYSICAL THERAPY TREATMENT Progress Note Reporting Period 04/29/23 to 07/20/23  See note below for Objective Data and Assessment of Progress/Goals.      Patient Name: Jesse Mcclure. MRN: [id number] DOB:Aug 06, 1951, 72 y.o., adult Today's Date: 07/20/2023   PCP: Sharlot Gowda MD REFERRING PROVIDER: Artis Delay MD  END OF SESSION:  PT End of Session - 07/20/23 1401     Visit Number 10    Date for PT Re-Evaluation 08/19/23    Authorization Type Medicare    PT Start Time 1400    PT Stop Time 1445    PT Time Calculation (min) 45 min    Activity Tolerance Patient tolerated treatment well    Behavior During Therapy WFL for tasks assessed/performed               Past Medical History:  Diagnosis Date   BPH (benign prostatic hypertrophy)    Degenerative disc disease    Diverticulosis    Hemochromatosis    HTN (hypertension)    Hyperlipidemia    Multiple myeloma (HCC)    Polio    Status post Nissen fundoplication (without gastrostomy tube) procedure    for hiatal hernia.   Past Surgical History:  Procedure Laterality Date   FOOT SURGERY     HIATAL HERNIA REPAIR     OTHER SURGICAL HISTORY     Cataract Surgery--Both eyes   SKIN BIOPSY Right 04/13/2022   squamous cell carcinoma in stiu arising in actinic keratosis   TOTAL KNEE ARTHROPLASTY Left 07/15/2021   Procedure: TOTAL KNEE ARTHROPLASTY;  Surgeon: Teryl Lucy, MD;  Location: [facility];  Service: Orthopedics;  Laterality: Left;   Undescended testes Right 10/05/1966   Patient Active Problem List   Diagnosis Date Noted   Snoring 06/08/2023   Chronic midline low back pain without sciatica 06/01/2023   Idiopathic gout 06/01/2023   (HFpEF) heart failure with preserved ejection fraction (HCC) 05/17/2023   Shortness of breath 05/16/2023   Essential tremor 05/11/2023   Balance disorder 04/23/2023   Squamous cell carcinoma in situ 04/14/2023   Melanoma in situ (HCC) 04/14/2023   History of pulmonary  embolus (PE) 09/04/2022   History of basal cell cancer 05/01/2022   First degree AV block 04/22/2022   History of skin cancer 04/21/2022   S/P TKR (total knee replacement), left 07/15/2021   Osteoarthritis of left knee 05/22/2021   Deficiency anemia 01/07/2021   ACE-inhibitor cough 12/16/2020   Tremor of both hands 10/11/2019   Peripheral neuropathy due to chemotherapy (HCC) 10/11/2019   Dry skin 10/11/2019   Insomnia disorder 09/06/2019   Loose stools 09/06/2019   S/P bone marrow transplant (HCC) 08/30/2019   Hypocalcemia 06/14/2019   Multiple myeloma without remission (HCC) 04/04/2019   Other constipation 04/04/2019   Glucose intolerance (impaired glucose tolerance) 05/20/2016   Vitamin D deficiency 05/20/2016   Chronic musculoskeletal pain 01/09/2016   History of orchiectomy, unilateral 05/20/2015   Obesity (BMI 30-39.9) 05/20/2015   Elevated PSA 05/20/2015   Arthritis 05/20/2015   Essential hypertension 03/02/2012   Hyperlipidemia 03/02/2012   Hemochromatosis, hereditary (HCC) 11/28/2008    ONSET DATE: > 1 year  REFERRING DIAG: G25.2 intention tremor; R26.89 balance disorder  THERAPY DIAG: difficulty in walking; fall risk Muscle weakness (generalized)  Difficulty in walking, not elsewhere classified  Balance disorder  Cramp and spasm  Abnormal posture  Rationale for Evaluation and Treatment: Rehabilitation  SUBJECTIVE:  SUBJECTIVE STATEMENT: Patient states he is doing good.    I feel like I am improving.     Eval: Being treated for cancer 4-5 years including oral chemo at home;  Dr wants me to walk and I use poles b/c I'm very wobbly.  Dr. says it's not the medication causing this.  Went on cruise in [place] a few months ago but I couldn't tour and stayed on the boat. No falls  but several close calls: 3-4 steps garage steps into the house, stumbled going up.  If I sit a while then get up and take a few steps, will feel wobbly.  Hold onto something to pick up something from the floor. Denies loss of sensation in feet. I sleep a lot.  I'm very sedentary.   My partner thinks I need to move more.  I ride the stationary bike Tuesday/Thursday occasionally.  I used to have a trainer prior to getting sick. I get wheelchair service at airports. [place] trip in May  Goes by [name]  PERTINENT HISTORY:  Oncology History  Multiple myeloma without remission (HCC)  03/20/2019 Imaging    1. Tumor involving the L5 and S1 and S2 segments of the spine as described with mass effects upon the left L4, L5, S1 and more distal left sacral nerves   Left TKR HTN Back pain  PAIN:  Are you having pain? Yes  NPRS scale:  7/10 took an oxy this morning Pain location: LBP   Aggravating factors: standing in the kitchen Relieving factors: ibuprofen, oxy if it gets bad   PRECAUTIONS: Fall    WEIGHT BEARING RESTRICTIONS: No  FALLS: Has patient fallen in last 6 months? No  LIVING ENVIRONMENT: Lives with: partner Lives in: House/apartment 2 story main living downstairs Stairs: Yes: Internal: 10 steps; on right going up and External: 3 steps; on left going up Has following equipment at home: walking poles   PLOF: partner does cooking, pt does some grilling Enjoys being with friends, hanging out; doesn't go to social events  b/c I can't stand for long time 10 minutes  PATIENT GOALS: I want to walk better, stand longer; going to [place] in May for 11 days; ride bicycles there;  I'd like to not have to use wheelchair service at the airport  The Patient-Specific Functional Scale  Initial:  I am going to ask you to identify up to 3 important activities that you are unable to do or are having difficulty with as a result of this problem.  Today are there any activities that you are unable  to do or having difficulty with because of this?  (Patient shown scale and patient rated each activity)  Follow up: When you first came in you had difficulty performing these activities.  Today do you still have difficulty?  Patient-Specific activity scoring scheme (Point to one number):  0 1 2 3 4 5 6 7 8 9  10 Unable  Able to perform To perform                                                                                                    activity at the same Activity         Level as before                                                                                                                       Injury or problem  Activity     Standing for parties                                    Initial:       4                2.        Going to [employer] show                              Initial:    3                      OBJECTIVE:    COGNITION: Overall cognitive status: Within functional limits for tasks assessed    LOWER EXTREMITY ROM:   grossly WFLs  LOWER EXTREMITY MMT:    MMT Right Eval Right  07/20/23 Left Eval Left  07/20/23  Hip flexion  4  4  Hip extension      Hip abduction 4 4+ 4 4+  Hip adduction      Hip internal rotation      Hip external rotation      Knee flexion      Knee extension 4 4 4 4   Ankle dorsiflexion 4 4 4 4   Ankle plantarflexion 4 4 4 4   Ankle inversion      Ankle eversion      (Blank rows = not tested)           TRUNK STRENGTH:  Decreased activation of transverse abdominus muscles; abdominals 4-/5; decreased activation of lumbar multifidi; trunk extensors 4-/5 07/20/23:  Improved to 4/5                                  GAIT: Comments: wider base of support; decreased step length; decreased dorsiflexion  FUNCTIONAL TESTS:  5x sit to stand: 20.31 no hands TUG 12.11  sec no hands 2 min walk test 366 feet RPE 7/10 BERG balance  test:  43/56 indicating a 50% risk of falls; most challenged by single limb standing or narrow base of support; decreased speed of movement, limited forward reach  9/19: 5 sit to stand 12.33 no hands TUG 11.61 no hands BERG: 49/56 Dynamic Gait Index: 18/24 difficulty with head turns and head extension  10/15: 5 sit to stand 12.98 no hands TUG 7.53 sec no hands BERG: 49/56 2 min walk test: 420.7 feet with RPE of 7/10  TODAY'S TREATMENT:       DATE:   07/20/23 NuStep L 3 x 5 min (blue machine) while discussing status  10th visit re-assessment completed Seated red power rows/trunk extension 20x Seated chair sit up holding pair of 3#     2 sets of 10x (2nd set with overhead press) Sit to stands holding pair of 3#: 2 sets of 10 Standing dead lift with red power cord under feet 2 x 5 Seated hip flexion up and over dumbbell on floor with 5# KB resting on thigh x 10 each LE  Seated red power cord press out 10x 2 sets  07/13/23 NuStep L 1 x 5 min (green machine) while discussing status  Seated red power rows/trunk extension 20x Seated chair sit up holding pair of 3#     2 sets of 10x (2nd set with overhead press) Sit to stands holding pair of 3#: 2 sets of 10 O2 checked: 93% immediately after, held at 93% then back up to 95% within 10 sec with proper breathing Seated dead lift with red power cord under feet 2 x 10 Seated hip flexion up and over dumbbell on floor with 5# KB resting on thigh x 5 each LE (patient with extreme difficulty with this and fatigued very easily) Seated red power cord press out 10x 2 sets Seated blue band rows 20x  Lateral hurdle step overs 2 x 10 each LE at counter Walk for activity tolerance : able to do 2 min 30 sec and covered 480 feet  07/06/23 NuStep L 1 x 5 min (blue machine) while discussing status  Seated red power rows/trunk extension 20x Seated chair sit up holding pair of 3#     2 sets of 10x (2nd set with overhead press) Sit to stands holding pair  of 3#: 2 sets of 5 (verbal cues for weight shift fwd) O2 checked: 96% immediately after, dropped to 93% then back up to 96% with proper breathing Seated dead lift with red power cord under feet 2 x 10 Seated hip flexion up and over dumbbell on floor with 5# KB resting on thigh x 5 each LE (patient with extreme difficulty with this and fatigued very easily) Seated red power cord press out 10x 2 sets Seated blue band rows 20x  Standing alternating lateral cone step (did hurdle instead today due to slightly lower profile) overs 5x  2 sets (slightly less fatigue today) O2 checked again, similar pattern, rebounds quickly with proper breathing: educated on slow long breaths in through nose and long slow breaths out through pursed lips to avoid hyperventilation.    PATIENT EDUCATION: Education details: educated on slow long breaths in through nose and long slow breaths out through pursed lips to avoid hyperventilation.  Person educated: Patient Education method: Explanation Education comprehension: verbalized understanding  HOME EXERCISE PROGRAM: To be started  GOALS: Goals reviewed with patient? Yes  SHORT TERM GOALS: Target date: 05/27/2023   The patient  will demonstrate knowledge of basic self care strategies and exercises to promote strength and mobility  Baseline: Goal status: met 9/19  2.  Able to walk 2 min 375 feet with RPE 5/10 Baseline:  Goal status: partially met RPE 7/10 today but much improved distance  3.  The patient will have an improved BERG balance score to   46 /56 indicating reduced risk of falls Baseline:  Goal status: met 9/19  4.  Improved LE strength and balance  indicated by improved 5x STS to 16 sec Baseline:  Goal status: met 9/19  5.  Set DGI for MiniBESTest goal Baseline:  Goal status: met 9/19    LONG TERM GOALS: Target date: 08/19/2023   The patient will be independent in a safe self progression of a home exercise program/gym program to promote  further recovery of function  Baseline:  Goal status: MET  2.  The patient will have improved gait stamina and speed needed to ambulate 600 feet in 6 minutes with RPE of 6/10 Baseline:  Goal status: =ongoing  3.  The patient will have an improved BERG balance score to  48  /56 indicating reduced risk of falls  Baseline:  Goal status: met 9/16  4.  The patient will have improved Timed Up and Go (TUG) time to    <12   sec indicating improved gait speed and LE strength  Baseline:  Goal status: met 9/19  5.  The patient will have improved hip and knee strength to at least 4+/5 needed for standing >10 min for washing dishes Baseline:  Goal status: ongoing  6. Improved balance with DGI to 21/24 indicating decreased risk of falls  7. Crowds at [facility] and walk to car/parking lot with PSFS to 5  8. Standing for 3 minutes for parties for socializing with PSFS of 6   ASSESSMENT:  CLINICAL IMPRESSION: [name] demonstrates improved activity tolerance and strength.  He also gained strength in hip musculature but still struggles with hip flexion activities.  He seems to have improved with compliance in his walking program and HEP.   He should continue to well. He would benefit from skilled PT for LE strengthening and proximal hip stability along with balance and core strength.     OBJECTIVE IMPAIRMENTS: cardiopulmonary status limiting activity, decreased activity tolerance, decreased balance, decreased endurance, decreased mobility, difficulty walking, decreased strength, impaired perceived functional ability, and pain.   ACTIVITY LIMITATIONS: carrying, lifting, bending, standing, squatting, and locomotion level  PARTICIPATION LIMITATIONS: meal prep, cleaning, interpersonal relationship, shopping, and community activity  PERSONAL FACTORS: Fitness, Time since onset of injury/illness/exacerbation, and 3+ comorbidities: multiple myeloma, HTN, DDD lumbar spine  are also affecting patient's  functional outcome.   REHAB POTENTIAL: Good  CLINICAL DECISION MAKING: Evolving/moderate complexity  EVALUATION COMPLEXITY: Moderate  PLAN:  PT FREQUENCY: 2x/week  PT DURATION: 8 weeks  PLANNED INTERVENTIONS: Therapeutic exercises, Therapeutic activity, Neuromuscular re-education, Balance training, Gait training, Patient/Family education, Self Care, Stair training, Aquatic Therapy, Cryotherapy, Moist heat, Manual therapy, and Re-evaluation  PLAN FOR NEXT SESSION:  Progress walk at end of each session (timed and for distance) attempting to increase distance and time each visit, general strengthening; single leg and narrow base of support balance; dynamic gait with head turns; endurance  Kataleah Bejar B. Dorathea Faerber, PT 07/20/23 2:55 PM [facility] [address] [place], [place] [address] Phone # [phone] Fax [phone]

## 2023-07-22 ENCOUNTER — Ambulatory Visit: Payer: Medicare Other | Admitting: Physical Therapy

## 2023-07-22 ENCOUNTER — Other Ambulatory Visit: Payer: Self-pay

## 2023-07-22 DIAGNOSIS — M6281 Muscle weakness (generalized): Secondary | ICD-10-CM | POA: Diagnosis not present

## 2023-07-22 DIAGNOSIS — R252 Cramp and spasm: Secondary | ICD-10-CM | POA: Diagnosis not present

## 2023-07-22 DIAGNOSIS — R293 Abnormal posture: Secondary | ICD-10-CM | POA: Diagnosis not present

## 2023-07-22 DIAGNOSIS — R2689 Other abnormalities of gait and mobility: Secondary | ICD-10-CM | POA: Diagnosis not present

## 2023-07-22 DIAGNOSIS — R262 Difficulty in walking, not elsewhere classified: Secondary | ICD-10-CM | POA: Diagnosis not present

## 2023-07-22 DIAGNOSIS — C9000 Multiple myeloma not having achieved remission: Secondary | ICD-10-CM

## 2023-07-22 MED ORDER — POMALIDOMIDE 3 MG PO CAPS
ORAL_CAPSULE | ORAL | 0 refills | Status: DC
Start: 2023-07-22 — End: 2023-08-13

## 2023-07-22 NOTE — Therapy (Signed)
OUTPATIENT PHYSICAL THERAPY TREATMENT      Patient Name: Jesse Mcclure. MRN: [id number] DOB:1950-11-19, 72 y.o., adult Today's Date: 07/22/2023   PCP: Sharlot Gowda MD REFERRING PROVIDER: Artis Delay MD  END OF SESSION:  PT End of Session - 07/22/23 0924     Visit Number 11    Date for PT Re-Evaluation 08/19/23    Authorization Type Medicare    Progress Note Due on Visit 20    PT Start Time 0930    PT Stop Time 1010    PT Time Calculation (min) 40 min    Activity Tolerance Patient tolerated treatment well               Past Medical History:  Diagnosis Date   BPH (benign prostatic hypertrophy)    Degenerative disc disease    Diverticulosis    Hemochromatosis    HTN (hypertension)    Hyperlipidemia    Multiple myeloma (HCC)    Polio    Status post Nissen fundoplication (without gastrostomy tube) procedure    for hiatal hernia.   Past Surgical History:  Procedure Laterality Date   FOOT SURGERY     HIATAL HERNIA REPAIR     OTHER SURGICAL HISTORY     Cataract Surgery--Both eyes   SKIN BIOPSY Right 04/13/2022   squamous cell carcinoma in stiu arising in actinic keratosis   TOTAL KNEE ARTHROPLASTY Left 07/15/2021   Procedure: TOTAL KNEE ARTHROPLASTY;  Surgeon: Teryl Lucy, MD;  Location: [facility];  Service: Orthopedics;  Laterality: Left;   Undescended testes Right 10/05/1966   Patient Active Problem List   Diagnosis Date Noted   Snoring 06/08/2023   Chronic midline low back pain without sciatica 06/01/2023   Idiopathic gout 06/01/2023   (HFpEF) heart failure with preserved ejection fraction (HCC) 05/17/2023   Shortness of breath 05/16/2023   Essential tremor 05/11/2023   Balance disorder 04/23/2023   Squamous cell carcinoma in situ 04/14/2023   Melanoma in situ (HCC) 04/14/2023   History of pulmonary embolus (PE) 09/04/2022   History of basal cell cancer 05/01/2022   First degree AV block 04/22/2022   History of skin cancer 04/21/2022   S/P  TKR (total knee replacement), left 07/15/2021   Osteoarthritis of left knee 05/22/2021   Deficiency anemia 01/07/2021   ACE-inhibitor cough 12/16/2020   Tremor of both hands 10/11/2019   Peripheral neuropathy due to chemotherapy (HCC) 10/11/2019   Dry skin 10/11/2019   Insomnia disorder 09/06/2019   Loose stools 09/06/2019   S/P bone marrow transplant (HCC) 08/30/2019   Hypocalcemia 06/14/2019   Multiple myeloma without remission (HCC) 04/04/2019   Other constipation 04/04/2019   Glucose intolerance (impaired glucose tolerance) 05/20/2016   Vitamin D deficiency 05/20/2016   Chronic musculoskeletal pain 01/09/2016   History of orchiectomy, unilateral 05/20/2015   Obesity (BMI 30-39.9) 05/20/2015   Elevated PSA 05/20/2015   Arthritis 05/20/2015   Essential hypertension 03/02/2012   Hyperlipidemia 03/02/2012   Hemochromatosis, hereditary (HCC) 11/28/2008    ONSET DATE: > 1 year  REFERRING DIAG: G25.2 intention tremor; R26.89 balance disorder  THERAPY DIAG: difficulty in walking; fall risk Muscle weakness (generalized)  Difficulty in walking, not elsewhere classified  Balance disorder  Rationale for Evaluation and Treatment: Rehabilitation  SUBJECTIVE:  SUBJECTIVE STATEMENT: Feeling good this morning.  A little bit of pain in the back.  Took some oxy before I came.  Back gets a little sore later in the day after PT.   Trying to walk the dog every day at 4:00   Eval: Being treated for cancer 4-5 years including oral chemo at home;  Dr wants me to walk and I use poles b/c I'm very wobbly.  Dr. says it's not the medication causing this.  Went on cruise in [place] a few months ago but I couldn't tour and stayed on the boat. No falls but several close calls: 3-4 steps garage steps into the house,  stumbled going up.  If I sit a while then get up and take a few steps, will feel wobbly.  Hold onto something to pick up something from the floor. Denies loss of sensation in feet. I sleep a lot.  I'm very sedentary.   My partner thinks I need to move more.  I ride the stationary bike Tuesday/Thursday occasionally.  I used to have a trainer prior to getting sick. I get wheelchair service at airports. [place] trip in May  Goes by [name]  PERTINENT HISTORY:  Oncology History  Multiple myeloma without remission (HCC)  03/20/2019 Imaging    1. Tumor involving the L5 and S1 and S2 segments of the spine as described with mass effects upon the left L4, L5, S1 and more distal left sacral nerves   Left TKR HTN Back pain  PAIN:  Are you having pain? Yes  NPRS scale:  3-4/10 Pain location: LBP   Aggravating factors: standing in the kitchen Relieving factors: ibuprofen, oxy if it gets bad   PRECAUTIONS: Fall    WEIGHT BEARING RESTRICTIONS: No  FALLS: Has patient fallen in last 6 months? No  LIVING ENVIRONMENT: Lives with: partner Lives in: House/apartment 2 story main living downstairs Stairs: Yes: Internal: 10 steps; on right going up and External: 3 steps; on left going up Has following equipment at home: walking poles   PLOF: partner does cooking, pt does some grilling Enjoys being with friends, hanging out; doesn't go to social events  b/c I can't stand for long time 10 minutes  PATIENT GOALS: I want to walk better, stand longer; going to [place] in May for 11 days; ride bicycles there;  I'd like to not have to use wheelchair service at the airport  The Patient-Specific Functional Scale  Initial:  I am going to ask you to identify up to 3 important activities that you are unable to do or are having difficulty with as a result of this problem.  Today are there any activities that you are unable to do or having difficulty with because of this?  (Patient shown scale and patient rated  each activity)  Follow up: When you first came in you had difficulty performing these activities.  Today do you still have difficulty?  Patient-Specific activity scoring scheme (Point to one number):  0 1 2 3 4 5 6 7 8 9  10 Unable  Able to perform To perform                                                                                                    activity at the same Activity         Level as before                                                                                                                       Injury or problem  Activity     Standing for parties                                    Initial:       4                2.        Going to [employer] show                              Initial:    3                      OBJECTIVE:    COGNITION: Overall cognitive status: Within functional limits for tasks assessed    LOWER EXTREMITY ROM:   grossly WFLs  LOWER EXTREMITY MMT:    MMT Right Eval Right  07/20/23 Left Eval Left  07/20/23  Hip flexion  4  4  Hip extension      Hip abduction 4 4+ 4 4+  Hip adduction      Hip internal rotation      Hip external rotation      Knee flexion      Knee extension 4 4 4 4   Ankle dorsiflexion 4 4 4 4   Ankle plantarflexion 4 4 4 4   Ankle inversion      Ankle eversion      (Blank rows = not tested)           TRUNK STRENGTH:  Decreased activation of transverse abdominus muscles; abdominals 4-/5; decreased activation of lumbar multifidi; trunk extensors 4-/5 07/20/23:  Improved to 4/5                                  GAIT: Comments: wider base of support; decreased step length; decreased dorsiflexion  FUNCTIONAL TESTS:  5x sit to stand: 20.31 no hands TUG 12.11  sec no hands 2 min walk test 366 feet RPE 7/10 BERG balance test:  43/56 indicating a 50% risk of falls; most challenged by single limb standing or  narrow base of support; decreased speed of movement, limited forward reach  9/19: 5 sit to stand 12.33 no hands TUG 11.61 no hands BERG: 49/56 Dynamic Gait Index: 18/24 difficulty with head turns and head extension  10/15: 5 sit to stand 12.98 no hands TUG 7.53 sec no hands BERG: 49/56 2 min walk test: 420.7 feet with RPE of 7/10  TODAY'S TREATMENT:       DATE:   07/22/23 NuStep L 3 x 5 min (blue machine) while discussing status  Standing dynamic warm up: high step, side step, marching  Standing 5# dead lift to knee level to shoulder, UE support on one side 10x right/left  Seated red power rows/trunk extension 20x Seated chair sit up holding 5#  with overhead press  10x  Standing holding 5# with 6 inch step taps 2 sets of 10 Sit to stands plus 2 cushions: holding 15# KB 2 sets of 5 Seated red power cord trunk extension 11x 30# standing lat bar 10x 10# resisted backward walk 10x (increase weight next time) Walking 1:45     348 feet RPE 4/10  back feels fine 07/20/23 NuStep L 3 x 5 min (blue machine) while discussing status  10th visit re-assessment completed Seated red power rows/trunk extension 20x Seated chair sit up holding pair of 3#     2 sets of 10x (2nd set with overhead press) Sit to stands holding pair of 3#: 2 sets of 10 Standing dead lift with red power cord under feet 2 x 5 Seated hip flexion up and over dumbbell on floor with 5# KB resting on thigh x 10 each LE  Seated red power cord press out 10x 2 sets  07/13/23 NuStep L 1 x 5 min (green machine) while discussing status  Seated red power rows/trunk extension 20x Seated chair sit up holding pair of 3#     2 sets of 10x (2nd set with overhead press) Sit to stands holding pair of 3#: 2 sets of 10 O2 checked: 93% immediately after, held at 93% then back up to 95% within 10 sec with proper breathing Seated dead lift with red power cord under feet 2 x 10 Seated hip flexion up and over dumbbell on floor with 5# KB  resting on thigh x 5 each LE (patient with extreme difficulty with this and fatigued very easily) Seated red power cord press out 10x 2 sets Seated blue band rows 20x  Lateral hurdle step overs 2 x 10 each LE at counter Walk for activity tolerance : able to do 2 min 30 sec and covered 480 feet   PATIENT EDUCATION: Education details: educated on slow long breaths in through nose and long slow breaths out through pursed lips to avoid hyperventilation.  Person educated: Patient Education method: Explanation Education comprehension: verbalized understanding  HOME EXERCISE PROGRAM: To be started  GOALS: Goals reviewed with patient? Yes  SHORT TERM GOALS: Target date: 05/27/2023   The patient will demonstrate knowledge of basic self care strategies and exercises to promote strength and mobility  Baseline: Goal status: met 9/19  2.  Able to walk 2 min 375 feet with RPE 5/10 Baseline:  Goal status: partially met RPE 7/10 today but much improved distance  3.  The patient will have an improved BERG balance score to   46 /56  indicating reduced risk of falls Baseline:  Goal status: met 9/19  4.  Improved LE strength and balance  indicated by improved 5x STS to 16 sec Baseline:  Goal status: met 9/19  5.  Set DGI for MiniBESTest goal Baseline:  Goal status: met 9/19    LONG TERM GOALS: Target date: 08/19/2023   The patient will be independent in a safe self progression of a home exercise program/gym program to promote further recovery of function  Baseline:  Goal status: MET  2.  The patient will have improved gait stamina and speed needed to ambulate 600 feet in 6 minutes with RPE of 6/10 Baseline:  Goal status: =ongoing  3.  The patient will have an improved BERG balance score to  48  /56 indicating reduced risk of falls  Baseline:  Goal status: met 9/16  4.  The patient will have improved Timed Up and Go (TUG) time to    <12   sec indicating improved gait speed and LE  strength  Baseline:  Goal status: met 9/19  5.  The patient will have improved hip and knee strength to at least 4+/5 needed for standing >10 min for washing dishes Baseline:  Goal status: ongoing  6. Improved balance with DGI to 21/24 indicating decreased risk of falls  7. Crowds at [facility] and walk to car/parking lot with PSFS to 5  8. Standing for 3 minutes for parties for socializing with PSFS of 6   ASSESSMENT:  CLINICAL IMPRESSION: Able to increase standing time today with the inclusion of resisted backward walk and standing lat bar.  Walking time < 2 minutes today.  Back pain remains at a lower intensity throughout session although pt states he takes pain medication before coming.  Therapist monitoring response and modifying treatment accordingly.     OBJECTIVE IMPAIRMENTS: cardiopulmonary status limiting activity, decreased activity tolerance, decreased balance, decreased endurance, decreased mobility, difficulty walking, decreased strength, impaired perceived functional ability, and pain.   ACTIVITY LIMITATIONS: carrying, lifting, bending, standing, squatting, and locomotion level  PARTICIPATION LIMITATIONS: meal prep, cleaning, interpersonal relationship, shopping, and community activity  PERSONAL FACTORS: Fitness, Time since onset of injury/illness/exacerbation, and 3+ comorbidities: multiple myeloma, HTN, DDD lumbar spine  are also affecting patient's functional outcome.   REHAB POTENTIAL: Good  CLINICAL DECISION MAKING: Evolving/moderate complexity  EVALUATION COMPLEXITY: Moderate  PLAN:  PT FREQUENCY: 2x/week  PT DURATION: 8 weeks  PLANNED INTERVENTIONS: Therapeutic exercises, Therapeutic activity, Neuromuscular re-education, Balance training, Gait training, Patient/Family education, Self Care, Stair training, Aquatic Therapy, Cryotherapy, Moist heat, Manual therapy, and Re-evaluation  PLAN FOR NEXT SESSION:  Progress walk at end of each session (timed and  for distance) attempting to increase distance and time each visit, standing lat bar; resisted backward walk; general strengthening; single leg and narrow base of support balance; dynamic gait with head turns; endurance   Lavinia Sharps, PT 07/22/23 10:18 AM Phone: [phone] Fax: [phone]  [facility] [address] [place], [place] [address] Phone # [phone] Fax [phone]

## 2023-07-27 ENCOUNTER — Ambulatory Visit: Payer: Medicare Other | Admitting: Physical Therapy

## 2023-07-29 ENCOUNTER — Ambulatory Visit: Payer: Medicare Other | Admitting: Physical Therapy

## 2023-07-29 DIAGNOSIS — R2689 Other abnormalities of gait and mobility: Secondary | ICD-10-CM | POA: Diagnosis not present

## 2023-07-29 DIAGNOSIS — R262 Difficulty in walking, not elsewhere classified: Secondary | ICD-10-CM | POA: Diagnosis not present

## 2023-07-29 DIAGNOSIS — M6281 Muscle weakness (generalized): Secondary | ICD-10-CM | POA: Diagnosis not present

## 2023-07-29 DIAGNOSIS — R252 Cramp and spasm: Secondary | ICD-10-CM | POA: Diagnosis not present

## 2023-07-29 DIAGNOSIS — R293 Abnormal posture: Secondary | ICD-10-CM | POA: Diagnosis not present

## 2023-07-29 NOTE — Therapy (Signed)
OUTPATIENT PHYSICAL THERAPY TREATMENT      Patient Name: Jesse Mcclure. MRN: [id number] DOB:06-01-1951, 72 y.o., adult Today's Date: 07/29/2023   PCP: Jesse Gowda MD REFERRING PROVIDER: Artis Delay MD  END OF SESSION:  PT End of Session - 07/29/23 1107     Visit Number 12    Date for PT Re-Evaluation 08/19/23    Authorization Type Medicare    Progress Note Due on Visit 20    PT Start Time 1103    PT Stop Time 1145    PT Time Calculation (min) 42 min    Activity Tolerance Patient tolerated treatment well               Past Medical History:  Diagnosis Date   BPH (benign prostatic hypertrophy)    Degenerative disc disease    Diverticulosis    Hemochromatosis    HTN (hypertension)    Hyperlipidemia    Multiple myeloma (HCC)    Polio    Status post Nissen fundoplication (without gastrostomy tube) procedure    for hiatal hernia.   Past Surgical History:  Procedure Laterality Date   FOOT SURGERY     HIATAL HERNIA REPAIR     OTHER SURGICAL HISTORY     Cataract Surgery--Both eyes   SKIN BIOPSY Right 04/13/2022   squamous cell carcinoma in stiu arising in actinic keratosis   TOTAL KNEE ARTHROPLASTY Left 07/15/2021   Procedure: TOTAL KNEE ARTHROPLASTY;  Surgeon: Teryl Lucy, MD;  Location: [facility];  Service: Orthopedics;  Laterality: Left;   Undescended testes Right 10/05/1966   Patient Active Problem List   Diagnosis Date Noted   Snoring 06/08/2023   Chronic midline low back pain without sciatica 06/01/2023   Idiopathic gout 06/01/2023   (HFpEF) heart failure with preserved ejection fraction (HCC) 05/17/2023   Shortness of breath 05/16/2023   Essential tremor 05/11/2023   Balance disorder 04/23/2023   Squamous cell carcinoma in situ 04/14/2023   Melanoma in situ (HCC) 04/14/2023   History of pulmonary embolus (PE) 09/04/2022   History of basal cell cancer 05/01/2022   First degree AV block 04/22/2022   History of skin cancer 04/21/2022   S/P  TKR (total knee replacement), left 07/15/2021   Osteoarthritis of left knee 05/22/2021   Deficiency anemia 01/07/2021   ACE-inhibitor cough 12/16/2020   Tremor of both hands 10/11/2019   Peripheral neuropathy due to chemotherapy (HCC) 10/11/2019   Dry skin 10/11/2019   Insomnia disorder 09/06/2019   Loose stools 09/06/2019   S/P bone marrow transplant (HCC) 08/30/2019   Hypocalcemia 06/14/2019   Multiple myeloma without remission (HCC) 04/04/2019   Other constipation 04/04/2019   Glucose intolerance (impaired glucose tolerance) 05/20/2016   Vitamin D deficiency 05/20/2016   Chronic musculoskeletal pain 01/09/2016   History of orchiectomy, unilateral 05/20/2015   Obesity (BMI 30-39.9) 05/20/2015   Elevated PSA 05/20/2015   Arthritis 05/20/2015   Essential hypertension 03/02/2012   Hyperlipidemia 03/02/2012   Hemochromatosis, hereditary (HCC) 11/28/2008    ONSET DATE: > 1 year  REFERRING DIAG: G25.2 intention tremor; R26.89 balance disorder  THERAPY DIAG: difficulty in walking; fall risk Muscle weakness (generalized)  Difficulty in walking, not elsewhere classified  Balance disorder  Rationale for Evaluation and Treatment: Rehabilitation  SUBJECTIVE:  SUBJECTIVE STATEMENT: Doing OK.  The back is doing good. I took an Oxy earlier.  Yesterday I was able to stand and talk to a neighbor for a long time.   Eval: Being treated for cancer 4-5 years including oral chemo at home;  Dr wants me to walk and I use poles b/c I'm very wobbly.  Dr. says it's not the medication causing this.  Went on cruise in [place] a few months ago but I couldn't tour and stayed on the boat. No falls but several close calls: 3-4 steps garage steps into the house, stumbled going up.  If I sit a while then get up and take a  few steps, will feel wobbly.  Hold onto something to pick up something from the floor. Denies loss of sensation in feet. I sleep a lot.  I'm very sedentary.   My partner thinks I need to move more.  I ride the stationary bike Tuesday/Thursday occasionally.  I used to have a trainer prior to getting sick. I get wheelchair service at airports. [place] trip in May  Goes by Jesse Mcclure  PERTINENT HISTORY:  Oncology History  Multiple myeloma without remission (HCC)  03/20/2019 Imaging    1. Tumor involving the L5 and S1 and S2 segments of the spine as described with mass effects upon the left L4, L5, S1 and more distal left sacral nerves   Left TKR HTN Back pain  PAIN:  Are you having pain? Yes  NPRS scale:  3/10 Pain location: LBP   Aggravating factors: standing in the kitchen Relieving factors: ibuprofen, oxy if it gets bad   PRECAUTIONS: Fall    WEIGHT BEARING RESTRICTIONS: No  FALLS: Has patient fallen in last 6 months? No  LIVING ENVIRONMENT: Lives with: partner Lives in: House/apartment 2 story main living downstairs Stairs: Yes: Internal: 10 steps; on right going up and External: 3 steps; on left going up Has following equipment at home: walking poles   PLOF: partner does cooking, pt does some grilling Enjoys being with friends, hanging out; doesn't go to social events  b/c I can't stand for long time 10 minutes  PATIENT GOALS: I want to walk better, stand longer; going to [place] in May for 11 days; ride bicycles there;  I'd like to not have to use wheelchair service at the airport  The Patient-Specific Functional Scale  Initial:  I am going to ask you to identify up to 3 important activities that you are unable to do or are having difficulty with as a result of this problem.  Today are there any activities that you are unable to do or having difficulty with because of this?  (Patient shown scale and patient rated each activity)  Follow up: When you first came in you had  difficulty performing these activities.  Today do you still have difficulty?  Patient-Specific activity scoring scheme (Point to one number):  0 1 2 3 4 5 6 7 8 9  10 Unable  Able to perform To perform                                                                                                    activity at the same Activity         Level as before                                                                                                                       Injury or problem  Activity     Standing for parties                                    Initial:       4                2.        Going to [employer] show                              Initial:    3                      OBJECTIVE:    COGNITION: Overall cognitive status: Within functional limits for tasks assessed    LOWER EXTREMITY ROM:   grossly WFLs  LOWER EXTREMITY MMT:    MMT Right Eval Right  07/20/23 Left Eval Left  07/20/23  Hip flexion  4  4  Hip extension      Hip abduction 4 4+ 4 4+  Hip adduction      Hip internal rotation      Hip external rotation      Knee flexion      Knee extension 4 4 4 4   Ankle dorsiflexion 4 4 4 4   Ankle plantarflexion 4 4 4 4   Ankle inversion      Ankle eversion      (Blank rows = not tested)           TRUNK STRENGTH:  Decreased activation of transverse abdominus muscles; abdominals 4-/5; decreased activation of lumbar multifidi; trunk extensors 4-/5 07/20/23:  Improved to 4/5                                  GAIT: Comments: wider base of support; decreased step length; decreased dorsiflexion  FUNCTIONAL TESTS:  5x sit to stand: 20.31 no hands TUG 12.11  sec no hands 2 min walk test 366 feet RPE 7/10 BERG balance test:  43/56 indicating a 50% risk of falls; most challenged by single limb standing or narrow base of support; decreased speed of movement, limited  forward reach  9/19: 5 sit to stand 12.33 no hands TUG 11.61 no hands BERG: 49/56 Dynamic Gait Index: 18/24 difficulty with head turns and head extension  10/15: 5 sit to stand 12.98 no hands TUG 7.53 sec no hands BERG: 49/56 2 min walk test: 420.7 feet with RPE of 7/10  TODAY'S TREATMENT:       DATE:   07/22/23 2nd step hip flexor stretch 10x right/left Heel raises 10x 6 inch step ups 5x right/left Resisted cable walk 15# 10x Sit to stands plus 2 cushions (elevated seat height secondary to knee pain): holding 15# KB at chest level 10x 2 sets 30# standing lat bar 10x 2 sets Holding 5# KB (farmer's carry) with 6 inch step tap 10x on each side (needs single finger touch for balance) NuStep L 3 x 5 min (blue machine)  Low to medium rating of perceived exertion end of session, back "ok"  07/20/23 NuStep L 3 x 5 min (blue machine) while discussing status  10th visit re-assessment completed Seated red power rows/trunk extension 20x Seated chair sit up holding pair of 3#     2 sets of 10x (2nd set with overhead press) Sit to stands holding pair of 3#: 2 sets of 10 Standing dead lift with red power cord under feet 2 x 5 Seated hip flexion up and over dumbbell on floor with 5# KB resting on thigh x 10 each LE  Seated red power cord press out 10x 2 sets  07/13/23 NuStep L 1 x 5 min (green machine) while discussing status  Seated red power rows/trunk extension 20x Seated chair sit up holding pair of 3#     2 sets of 10x (2nd set with overhead press) Sit to stands holding pair of 3#: 2 sets of 10 O2 checked: 93% immediately after, held at 93% then back up to 95% within 10 sec with proper breathing Seated dead lift with red power cord under feet 2 x 10 Seated hip flexion up and over dumbbell on floor with 5# KB resting on thigh x 5 each LE (patient with extreme difficulty with this and fatigued very easily) Seated red power cord press out 10x 2 sets Seated blue band rows 20x  Lateral  hurdle step overs 2 x 10 each LE at counter Walk for activity tolerance : able to do 2 min 30 sec and covered 480 feet   PATIENT EDUCATION: Education details: educated on slow long breaths in through nose and long slow breaths out through pursed lips to avoid hyperventilation.  Person educated: Patient Education method: Explanation Education comprehension: verbalized understanding  HOME EXERCISE PROGRAM: To be started  GOALS: Goals reviewed with patient? Yes  SHORT TERM GOALS: Target date: 05/27/2023   The patient will demonstrate knowledge of basic self care strategies and exercises to promote strength and mobility  Baseline: Goal status: met 9/19  2.  Able to walk 2 min 375 feet with RPE 5/10 Baseline:  Goal status: partially met RPE 7/10 today but much improved distance  3.  The patient will have an improved BERG balance score to   46 /56 indicating reduced risk of falls Baseline:  Goal status: met 9/19  4.  Improved LE strength and balance  indicated by improved 5x STS to  16 sec Baseline:  Goal status: met 9/19  5.  Set DGI for MiniBESTest goal Baseline:  Goal status: met 9/19    LONG TERM GOALS: Target date: 08/19/2023   The patient will be independent in a safe self progression of a home exercise program/gym program to promote further recovery of function  Baseline:  Goal status: MET  2.  The patient will have improved gait stamina and speed needed to ambulate 600 feet in 6 minutes with RPE of 6/10 Baseline:  Goal status: =ongoing  3.  The patient will have an improved BERG balance score to  48  /56 indicating reduced risk of falls  Baseline:  Goal status: met 9/16  4.  The patient will have improved Timed Up and Go (TUG) time to    <12   sec indicating improved gait speed and LE strength  Baseline:  Goal status: met 9/19  5.  The patient will have improved hip and knee strength to at least 4+/5 needed for standing >10 min for washing  dishes Baseline:  Goal status: ongoing  6. Improved balance with DGI to 21/24 indicating decreased risk of falls  7. Crowds at [facility] and walk to car/parking lot with PSFS to 5  8. Standing for 3 minutes for parties for socializing with PSFS of 6   ASSESSMENT:  CLINICAL IMPRESSION: Steady improvement with increased time standing which has been limited by shortness of breath, back pain and general fatigue.  He is able to do 3-4 standing ex's followed by short seated rest breaks and recover quickly.  Close supervision for safety with dynamic ex's including resisted walking but no loss of balance. Patient reports he is walking the dog more regularly now but avoids woodsy/unpaved areas (fear of tripping on a root).   Therapist monitoring response throughout session and modifying treatment accordingly.     OBJECTIVE IMPAIRMENTS: cardiopulmonary status limiting activity, decreased activity tolerance, decreased balance, decreased endurance, decreased mobility, difficulty walking, decreased strength, impaired perceived functional ability, and pain.   ACTIVITY LIMITATIONS: carrying, lifting, bending, standing, squatting, and locomotion level  PARTICIPATION LIMITATIONS: meal prep, cleaning, interpersonal relationship, shopping, and community activity  PERSONAL FACTORS: Fitness, Time since onset of injury/illness/exacerbation, and 3+ comorbidities: multiple myeloma, HTN, DDD lumbar spine  are also affecting patient's functional outcome.   REHAB POTENTIAL: Good  CLINICAL DECISION MAKING: Evolving/moderate complexity  EVALUATION COMPLEXITY: Moderate  PLAN:  PT FREQUENCY: 2x/week  PT DURATION: 8 weeks  PLANNED INTERVENTIONS: Therapeutic exercises, Therapeutic activity, Neuromuscular re-education, Balance training, Gait training, Patient/Family education, Self Care, Stair training, Aquatic Therapy, Cryotherapy, Moist heat, Manual therapy, and Re-evaluation  PLAN FOR NEXT SESSION: pt reports a  lot going on next week so he will return the following week;  Progress walk at end of each session (timed and for distance) attempting to increase distance and time; standing lat bar; resisted backward walk; general strengthening; gradually increase time on his feet; step over obstacles for toe clearance/balance;  going to [facility] at [employer], PT 07/29/23 11:54 AM Phone: [phone] Fax: [phone]

## 2023-07-30 ENCOUNTER — Other Ambulatory Visit: Payer: Self-pay | Admitting: Family Medicine

## 2023-07-30 DIAGNOSIS — E785 Hyperlipidemia, unspecified: Secondary | ICD-10-CM

## 2023-08-02 ENCOUNTER — Telehealth: Payer: Self-pay

## 2023-08-02 NOTE — Telephone Encounter (Signed)
Returned his call. He is requesting Oxycodone refill to [facility] on Tuesday. He has enough Oxycodone to last until tomorrow.

## 2023-08-03 ENCOUNTER — Other Ambulatory Visit (HOSPITAL_COMMUNITY): Payer: Self-pay

## 2023-08-03 ENCOUNTER — Other Ambulatory Visit: Payer: Self-pay | Admitting: Hematology and Oncology

## 2023-08-03 MED ORDER — OXYCODONE HCL 5 MG PO TABS
5.0000 mg | ORAL_TABLET | ORAL | 0 refills | Status: DC | PRN
  Filled 2023-08-03: qty 60, 10d supply, fill #0

## 2023-08-06 ENCOUNTER — Other Ambulatory Visit: Payer: Self-pay | Admitting: Internal Medicine

## 2023-08-09 ENCOUNTER — Ambulatory Visit: Payer: Medicare Other | Attending: Hematology and Oncology

## 2023-08-09 DIAGNOSIS — R2689 Other abnormalities of gait and mobility: Secondary | ICD-10-CM | POA: Diagnosis not present

## 2023-08-09 DIAGNOSIS — R293 Abnormal posture: Secondary | ICD-10-CM | POA: Insufficient documentation

## 2023-08-09 DIAGNOSIS — M6281 Muscle weakness (generalized): Secondary | ICD-10-CM | POA: Insufficient documentation

## 2023-08-09 DIAGNOSIS — R262 Difficulty in walking, not elsewhere classified: Secondary | ICD-10-CM | POA: Insufficient documentation

## 2023-08-09 NOTE — Therapy (Signed)
OUTPATIENT PHYSICAL THERAPY TREATMENT      Patient Name: Jesse Mcclure. MRN: [id number] DOB:09-18-1951, 72 y.o., adult Today's Date: 08/09/2023   PCP: Jesse Gowda MD REFERRING PROVIDER: Artis Delay MD  END OF SESSION:  PT End of Session - 08/09/23 1017     Visit Number 13    Date for PT Re-Evaluation 08/19/23    Authorization Type Medicare    Progress Note Due on Visit 20    PT Start Time 1017    PT Stop Time 1100    PT Time Calculation (min) 43 min    Activity Tolerance Patient tolerated treatment well    Behavior During Therapy WFL for tasks assessed/performed                Past Medical History:  Diagnosis Date   BPH (benign prostatic hypertrophy)    Degenerative disc disease    Diverticulosis    Hemochromatosis    HTN (hypertension)    Hyperlipidemia    Multiple myeloma (HCC)    Polio    Status post Nissen fundoplication (without gastrostomy tube) procedure    for hiatal hernia.   Past Surgical History:  Procedure Laterality Date   FOOT SURGERY     HIATAL HERNIA REPAIR     OTHER SURGICAL HISTORY     Cataract Surgery--Both eyes   SKIN BIOPSY Right 04/13/2022   squamous cell carcinoma in stiu arising in actinic keratosis   TOTAL KNEE ARTHROPLASTY Left 07/15/2021   Procedure: TOTAL KNEE ARTHROPLASTY;  Surgeon: Jesse Lucy, MD;  Location: [facility];  Service: Orthopedics;  Laterality: Left;   Undescended testes Right 10/05/1966   Patient Active Problem List   Diagnosis Date Noted   Snoring 06/08/2023   Chronic midline low back pain without sciatica 06/01/2023   Idiopathic gout 06/01/2023   (HFpEF) heart failure with preserved ejection fraction (HCC) 05/17/2023   Shortness of breath 05/16/2023   Essential tremor 05/11/2023   Balance disorder 04/23/2023   Squamous cell carcinoma in situ 04/14/2023   Melanoma in situ (HCC) 04/14/2023   History of pulmonary embolus (PE) 09/04/2022   History of basal cell cancer 05/01/2022   First degree AV  block 04/22/2022   History of skin cancer 04/21/2022   S/P TKR (total knee replacement), left 07/15/2021   Osteoarthritis of left knee 05/22/2021   Deficiency anemia 01/07/2021   ACE-inhibitor cough 12/16/2020   Tremor of both hands 10/11/2019   Peripheral neuropathy due to chemotherapy (HCC) 10/11/2019   Dry skin 10/11/2019   Insomnia disorder 09/06/2019   Loose stools 09/06/2019   S/P bone marrow transplant (HCC) 08/30/2019   Hypocalcemia 06/14/2019   Multiple myeloma without remission (HCC) 04/04/2019   Other constipation 04/04/2019   Glucose intolerance (impaired glucose tolerance) 05/20/2016   Vitamin D deficiency 05/20/2016   Chronic musculoskeletal pain 01/09/2016   History of orchiectomy, unilateral 05/20/2015   Obesity (BMI 30-39.9) 05/20/2015   Elevated PSA 05/20/2015   Arthritis 05/20/2015   Essential hypertension 03/02/2012   Hyperlipidemia 03/02/2012   Hemochromatosis, hereditary (HCC) 11/28/2008    ONSET DATE: > 1 year  REFERRING DIAG: G25.2 intention tremor; R26.89 balance disorder  THERAPY DIAG: difficulty in walking; fall risk Muscle weakness (generalized)  Difficulty in walking, not elsewhere classified  Balance disorder  Rationale for Evaluation and Treatment: Rehabilitation  SUBJECTIVE:  SUBJECTIVE STATEMENT: I did longer periods of standing over the weekend.    Eval: Being treated for cancer 4-5 years including oral chemo at home;  Dr wants me to walk and I use poles b/c I'm very wobbly.  Dr. says it's not the medication causing this.  Went on cruise in [place] a few months ago but I couldn't tour and stayed on the boat. No falls but several close calls: 3-4 steps garage steps into the house, stumbled going up.  If I sit a while then get up and take a few steps, will  feel wobbly.  Hold onto something to pick up something from the floor. Denies loss of sensation in feet. I sleep a lot.  I'm very sedentary.   My partner thinks I need to move more.  I ride the stationary bike Tuesday/Thursday occasionally.  I used to have a trainer prior to getting sick. I get wheelchair service at airports. [place] trip in May  Goes by Jesse Mcclure  PERTINENT HISTORY:  Oncology History  Multiple myeloma without remission (HCC)  03/20/2019 Imaging    1. Tumor involving the L5 and S1 and S2 segments of the spine as described with mass effects upon the left L4, L5, S1 and more distal left sacral nerves   Left TKR HTN Back pain  PAIN:  Are you having pain? Yes  NPRS scale:  3/10 Pain location: LBP   Aggravating factors: standing in the kitchen Relieving factors: ibuprofen, oxy if it gets bad   PRECAUTIONS: Fall    WEIGHT BEARING RESTRICTIONS: No  FALLS: Has patient fallen in last 6 months? No  LIVING ENVIRONMENT: Lives with: partner Lives in: House/apartment 2 story main living downstairs Stairs: Yes: Internal: 10 steps; on right going up and External: 3 steps; on left going up Has following equipment at home: walking poles   PLOF: partner does cooking, pt does some grilling Enjoys being with friends, hanging out; doesn't go to social events  b/c I can't stand for long time 10 minutes  PATIENT GOALS: I want to walk better, stand longer; going to [place] in May for 11 days; ride bicycles there;  I'd like to not have to use wheelchair service at the airport  The Patient-Specific Functional Scale  Initial:  I am going to ask you to identify up to 3 important activities that you are unable to do or are having difficulty with as a result of this problem.  Today are there any activities that you are unable to do or having difficulty with because of this?  (Patient shown scale and patient rated each activity)  Follow up: When you first came in you had difficulty  performing these activities.  Today do you still have difficulty?  Patient-Specific activity scoring scheme (Point to one number):  0 1 2 3 4 5 6 7 8 9  10 Unable                                                                                                          Able to  perform To perform                                                                                                    activity at the same Activity         Level as before                                                                                                                       Injury or problem  Activity     Standing for parties                                    Initial:       4                2.        Going to [employer] show                              Initial:    3                      OBJECTIVE:    COGNITION: Overall cognitive status: Within functional limits for tasks assessed    LOWER EXTREMITY ROM:   grossly WFLs  LOWER EXTREMITY MMT:    MMT Right Eval Right  07/20/23 Left Eval Left  07/20/23  Hip flexion  4  4  Hip extension      Hip abduction 4 4+ 4 4+  Hip adduction      Hip internal rotation      Hip external rotation      Knee flexion      Knee extension 4 4 4 4   Ankle dorsiflexion 4 4 4 4   Ankle plantarflexion 4 4 4 4   Ankle inversion      Ankle eversion      (Blank rows = not tested)           TRUNK STRENGTH:  Decreased activation of transverse abdominus muscles; abdominals 4-/5; decreased activation of lumbar multifidi; trunk extensors 4-/5 07/20/23:  Improved to 4/5                                  GAIT: Comments: wider base of support; decreased step length; decreased dorsiflexion  FUNCTIONAL TESTS:  5x sit to stand: 20.31 no hands TUG 12.11 sec no  hands 2 min walk test 366 feet RPE 7/10 BERG balance test:  43/56 indicating a 50% risk of falls; most challenged by single limb standing or narrow base of support; decreased speed of movement, limited forward  reach  9/19: 5 sit to stand 12.33 no hands TUG 11.61 no hands BERG: 49/56 Dynamic Gait Index: 18/24 difficulty with head turns and head extension  10/15: 5 sit to stand 12.98 no hands TUG 7.53 sec no hands BERG: 49/56 2 min walk test: 420.7 feet with RPE of 7/10  TODAY'S TREATMENT:       DATE:   08/09/23 2nd step hip flexor stretch 10x right/left Seated hamstring stretch 10" hold x10 Heel raises 2x10 Resisted cable walk 15# 10x Sit to stands plus 1 cushion (elevated seat height secondary to knee pain): holding 15# KB at chest level 3x5- fatigued by this weight and could only do sets of 5.  30# standing lat bar 10x 2 sets Holding 5# KB (farmer's carry) with 6 inch step-up 10x on each side  5# kettle bell (Farmer's Carry) walk down hallway between ortho and cancer gym, around cancer gym and back- 1 lap each arm NuStep L 3 x 5 min (blue machine)-PT present to discuss progress RPE 7-8/10 at end of session  07/29/23 2nd step hip flexor stretch 10x right/left Heel raises 10x 6 inch step ups 5x right/left Resisted cable walk 15# 10x Sit to stands plus 2 cushions (elevated seat height secondary to knee pain): holding 15# KB at chest level 10x 2 sets 30# standing lat bar 10x 2 sets Holding 5# KB (farmer's carry) with 6 inch step tap 10x on each side (needs single finger touch for balance) NuStep L 3 x 5 min (blue machine)  Low to medium rating of perceived exertion end of session, back "ok"  07/20/23 NuStep L 3 x 5 min (blue machine) while discussing status  10th visit re-assessment completed Seated red power rows/trunk extension 20x Seated chair sit up holding pair of 3#     2 sets of 10x (2nd set with overhead press) Sit to stands holding pair of 3#: 2 sets of 10 Standing dead lift with red power cord under feet 2 x 5 Seated hip flexion up and over dumbbell on floor with 5# KB resting on thigh x 10 each LE  Seated red power cord press out 10x 2 sets  PATIENT  EDUCATION: Education details: educated on slow long breaths in through nose and long slow breaths out through pursed lips to avoid hyperventilation.  Person educated: Patient Education method: Explanation Education comprehension: verbalized understanding  HOME EXERCISE PROGRAM: To be started  GOALS: Goals reviewed with patient? Yes  SHORT TERM GOALS: Target date: 05/27/2023   The patient will demonstrate knowledge of basic self care strategies and exercises to promote strength and mobility  Baseline: Goal status: met 9/19  2.  Able to walk 2 min 375 feet with RPE 5/10 Baseline:  Goal status: partially met RPE 7/10 today but much improved distance  3.  The patient will have an improved BERG balance score to   46 /56 indicating reduced risk of falls Baseline:  Goal status: met 9/19  4.  Improved LE strength and balance  indicated by improved 5x STS to 16 sec Baseline:  Goal status: met 9/19  5.  Set DGI for MiniBESTest goal Baseline:  Goal status: met 9/19    LONG TERM GOALS: Target date: 08/19/2023   The patient will be independent in a safe  self progression of a home exercise program/gym program to promote further recovery of function  Baseline:  Goal status: MET  2.  The patient will have improved gait stamina and speed needed to ambulate 600 feet in 6 minutes with RPE of 6/10 Baseline:  Goal status: =ongoing  3.  The patient will have an improved BERG balance score to  48  /56 indicating reduced risk of falls  Baseline:  Goal status: met 9/16  4.  The patient will have improved Timed Up and Go (TUG) time to    <12   sec indicating improved gait speed and LE strength  Baseline:  Goal status: met 9/19  5.  The patient will have improved hip and knee strength to at least 4+/5 needed for standing >10 min for washing dishes Baseline:  Goal status: ongoing  6. Improved balance with DGI to 21/24 indicating decreased risk of falls  7. Crowds at [facility] and walk  to car/parking lot with PSFS to 5  8. Standing for 3 minutes for parties for socializing with PSFS of 6   ASSESSMENT:  CLINICAL IMPRESSION: Pt was fatigued with sit to stand with 15# weight so reps reduced reps for this.   Close supervision for safety with dynamic ex's including resisted walking but no loss of balance. 7-8/10 RPE at the end of session today.  Pt reports that he is able to stand longer periods at home and is pleased with that.   Therapist monitoring response throughout session and modifying treatment accordingly.  Patient will benefit from skilled PT to address the below impairments and improve overall function.    OBJECTIVE IMPAIRMENTS: cardiopulmonary status limiting activity, decreased activity tolerance, decreased balance, decreased endurance, decreased mobility, difficulty walking, decreased strength, impaired perceived functional ability, and pain.   ACTIVITY LIMITATIONS: carrying, lifting, bending, standing, squatting, and locomotion level  PARTICIPATION LIMITATIONS: meal prep, cleaning, interpersonal relationship, shopping, and community activity  PERSONAL FACTORS: Fitness, Time since onset of injury/illness/exacerbation, and 3+ comorbidities: multiple myeloma, HTN, DDD lumbar spine  are also affecting patient's functional outcome.   REHAB POTENTIAL: Good  CLINICAL DECISION MAKING: Evolving/moderate complexity  EVALUATION COMPLEXITY: Moderate  PLAN:  PT FREQUENCY: 2x/week  PT DURATION: 8 weeks  PLANNED INTERVENTIONS: Therapeutic exercises, Therapeutic activity, Neuromuscular re-education, Balance training, Gait training, Patient/Family education, Self Care, Stair training, Aquatic Therapy, Cryotherapy, Moist heat, Manual therapy, and Re-evaluation  PLAN FOR NEXT SESSION:  resisted backward walk; general strengthening; gradually increase time on his feet; step over obstacles for toe clearance/balance;  going to [facility] at [employer],  PT 08/09/23 11:01 AM  Phone: [phone] Fax: [phone]

## 2023-08-11 ENCOUNTER — Other Ambulatory Visit: Payer: Self-pay | Admitting: Hematology and Oncology

## 2023-08-11 ENCOUNTER — Ambulatory Visit: Payer: Medicare Other

## 2023-08-11 DIAGNOSIS — M6281 Muscle weakness (generalized): Secondary | ICD-10-CM | POA: Diagnosis not present

## 2023-08-11 DIAGNOSIS — R293 Abnormal posture: Secondary | ICD-10-CM

## 2023-08-11 DIAGNOSIS — R2689 Other abnormalities of gait and mobility: Secondary | ICD-10-CM

## 2023-08-11 DIAGNOSIS — R262 Difficulty in walking, not elsewhere classified: Secondary | ICD-10-CM

## 2023-08-11 NOTE — Therapy (Signed)
OUTPATIENT PHYSICAL THERAPY TREATMENT      Patient Name: Jesse Mcclure. MRN: [id number] DOB:May 18, 1951, 72 y.o., adult Today's Date: 08/11/2023   PCP: Sharlot Gowda MD REFERRING PROVIDER: Artis Delay MD  END OF SESSION:  PT End of Session - 08/11/23 1051     Visit Number 14    Date for PT Re-Evaluation 08/19/23    Authorization Type Medicare    Progress Note Due on Visit 20    PT Start Time 1013    PT Stop Time 1054    PT Time Calculation (min) 41 min    Activity Tolerance Patient tolerated treatment well    Behavior During Therapy WFL for tasks assessed/performed                 Past Medical History:  Diagnosis Date   BPH (benign prostatic hypertrophy)    Degenerative disc disease    Diverticulosis    Hemochromatosis    HTN (hypertension)    Hyperlipidemia    Multiple myeloma (HCC)    Polio    Status post Nissen fundoplication (without gastrostomy tube) procedure    for hiatal hernia.   Past Surgical History:  Procedure Laterality Date   FOOT SURGERY     HIATAL HERNIA REPAIR     OTHER SURGICAL HISTORY     Cataract Surgery--Both eyes   SKIN BIOPSY Right 04/13/2022   squamous cell carcinoma in stiu arising in actinic keratosis   TOTAL KNEE ARTHROPLASTY Left 07/15/2021   Procedure: TOTAL KNEE ARTHROPLASTY;  Surgeon: Teryl Lucy, MD;  Location: [facility];  Service: Orthopedics;  Laterality: Left;   Undescended testes Right 10/05/1966   Patient Active Problem List   Diagnosis Date Noted   Snoring 06/08/2023   Chronic midline low back pain without sciatica 06/01/2023   Idiopathic gout 06/01/2023   (HFpEF) heart failure with preserved ejection fraction (HCC) 05/17/2023   Shortness of breath 05/16/2023   Essential tremor 05/11/2023   Balance disorder 04/23/2023   Squamous cell carcinoma in situ 04/14/2023   Melanoma in situ (HCC) 04/14/2023   History of pulmonary embolus (PE) 09/04/2022   History of basal cell cancer 05/01/2022   First degree AV  block 04/22/2022   History of skin cancer 04/21/2022   S/P TKR (total knee replacement), left 07/15/2021   Osteoarthritis of left knee 05/22/2021   Deficiency anemia 01/07/2021   ACE-inhibitor cough 12/16/2020   Tremor of both hands 10/11/2019   Peripheral neuropathy due to chemotherapy (HCC) 10/11/2019   Dry skin 10/11/2019   Insomnia disorder 09/06/2019   Loose stools 09/06/2019   S/P bone marrow transplant (HCC) 08/30/2019   Hypocalcemia 06/14/2019   Multiple myeloma without remission (HCC) 04/04/2019   Other constipation 04/04/2019   Glucose intolerance (impaired glucose tolerance) 05/20/2016   Vitamin D deficiency 05/20/2016   Chronic musculoskeletal pain 01/09/2016   History of orchiectomy, unilateral 05/20/2015   Obesity (BMI 30-39.9) 05/20/2015   Elevated PSA 05/20/2015   Arthritis 05/20/2015   Essential hypertension 03/02/2012   Hyperlipidemia 03/02/2012   Hemochromatosis, hereditary (HCC) 11/28/2008    ONSET DATE: > 1 year  REFERRING DIAG: G25.2 intention tremor; R26.89 balance disorder  THERAPY DIAG: difficulty in walking; fall risk Muscle weakness (generalized)  Difficulty in walking, not elsewhere classified  Balance disorder  Abnormal posture  Rationale for Evaluation and Treatment: Rehabilitation  SUBJECTIVE:  SUBJECTIVE STATEMENT: I was a little sore after last visit but not too bad.   I'm trying to do more walking at home.    Eval: Being treated for cancer 4-5 years including oral chemo at home;  Dr wants me to walk and I use poles b/c I'm very wobbly.  Dr. says it's not the medication causing this.  Went on cruise in [place] a few months ago but I couldn't tour and stayed on the boat. No falls but several close calls: 3-4 steps garage steps into the house, stumbled  going up.  If I sit a while then get up and take a few steps, will feel wobbly.  Hold onto something to pick up something from the floor. Denies loss of sensation in feet. I sleep a lot.  I'm very sedentary.   My partner thinks I need to move more.  I ride the stationary bike Tuesday/Thursday occasionally.  I used to have a trainer prior to getting sick. I get wheelchair service at airports. [place] trip in May  Goes by Jesse Mcclure  PERTINENT HISTORY:  Oncology History  Multiple myeloma without remission (HCC)  03/20/2019 Imaging    1. Tumor involving the L5 and S1 and S2 segments of the spine as described with mass effects upon the left L4, L5, S1 and more distal left sacral nerves   Left TKR HTN Back pain  PAIN:  Are you having pain? Yes  NPRS scale:  3/10 Pain location: LBP   Aggravating factors: standing in the kitchen Relieving factors: ibuprofen, oxy if it gets bad   PRECAUTIONS: Fall    WEIGHT BEARING RESTRICTIONS: No  FALLS: Has patient fallen in last 6 months? No  LIVING ENVIRONMENT: Lives with: partner Lives in: House/apartment 2 story main living downstairs Stairs: Yes: Internal: 10 steps; on right going up and External: 3 steps; on left going up Has following equipment at home: walking poles   PLOF: partner does cooking, pt does some grilling Enjoys being with friends, hanging out; doesn't go to social events  b/c I can't stand for long time 10 minutes  PATIENT GOALS: I want to walk better, stand longer; going to [place] in May for 11 days; ride bicycles there;  I'd like to not have to use wheelchair service at the airport  The Patient-Specific Functional Scale  Initial:  I am going to ask you to identify up to 3 important activities that you are unable to do or are having difficulty with as a result of this problem.  Today are there any activities that you are unable to do or having difficulty with because of this?  (Patient shown scale and patient rated each  activity)  Follow up: When you first came in you had difficulty performing these activities.  Today do you still have difficulty?  Patient-Specific activity scoring scheme (Point to one number):  0 1 2 3 4 5 6 7 8 9  10 Unable  Able to perform To perform                                                                                                    activity at the same Activity         Level as before                                                                                                                       Injury or problem  Activity     Standing for parties                                    Initial:       4                2.        Going to [employer] show                              Initial:    3                      OBJECTIVE:    COGNITION: Overall cognitive status: Within functional limits for tasks assessed    LOWER EXTREMITY ROM:   grossly WFLs  LOWER EXTREMITY MMT:    MMT Right Eval Right  07/20/23 Left Eval Left  07/20/23  Hip flexion  4  4  Hip extension      Hip abduction 4 4+ 4 4+  Hip adduction      Hip internal rotation      Hip external rotation      Knee flexion      Knee extension 4 4 4 4   Ankle dorsiflexion 4 4 4 4   Ankle plantarflexion 4 4 4 4   Ankle inversion      Ankle eversion      (Blank rows = not tested)           TRUNK STRENGTH:  Decreased activation of transverse abdominus muscles; abdominals 4-/5; decreased activation of lumbar multifidi; trunk extensors 4-/5 07/20/23:  Improved to 4/5                                  GAIT: Comments: wider base of support; decreased step length; decreased dorsiflexion  FUNCTIONAL TESTS:  5x sit to stand: 20.31 no hands TUG 12.11  sec no hands 2 min walk test 366 feet RPE 7/10 BERG balance test:  43/56 indicating a 50% risk of falls; most challenged by single limb standing or narrow  base of support; decreased speed of movement, limited forward reach  9/19: 5 sit to stand 12.33 no hands TUG 11.61 no hands BERG: 49/56 Dynamic Gait Index: 18/24 difficulty with head turns and head extension  10/15: 5 sit to stand 12.98 no hands TUG 7.53 sec no hands BERG: 49/56 2 min walk test: 420.7 feet with RPE of 7/10  TODAY'S TREATMENT:       DATE:   08/11/23 2nd step hip flexor stretch 10x right/left Seated hamstring stretch 10" hold x10 Heel raises 2x10 Resisted cable walk 15# 10x Sit to stands plus 1 cushion (elevated seat height secondary to knee pain): holding 5# KB at chest level 2x1030# standing lat bar 10x 2 sets Standing diagonals with 5# kettlebell: hip to shoulder x10 bil each Hurdles at counter top: step-to forward and lateral with min UE support with Ues.   5# kettle bell (Farmer's Carry) walk down hallway between ortho and cancer gym, around cancer gym and back- 1 lap each arm NuStep L 3 x 5 min (blue machine)-PT present to discuss progress 08/09/23 2nd step hip flexor stretch 10x right/left Seated hamstring stretch 10" hold x10 Heel raises 2x10 Resisted cable walk 15# 10x Sit to stands plus 1 cushion (elevated seat height secondary to knee pain): holding 15# KB at chest level 3x5- fatigued by this weight and could only do sets of 5.  30# standing lat bar 10x 2 sets Holding 5# KB (farmer's carry) with 6 inch step-up 10x on each side  5# kettle bell (Farmer's Carry) walk down hallway between ortho and cancer gym, around cancer gym and back- 1 lap each arm NuStep L 3 x 5 min (blue machine)-PT present to discuss progress RPE 7-8/10 at end of session  07/29/23 2nd step hip flexor stretch 10x right/left Heel raises 10x 6 inch step ups 5x right/left Resisted cable walk 15# 10x Sit to stands plus 2 cushions (elevated seat height secondary to knee pain): holding 15# KB at chest level 10x 2 sets 30# standing lat bar 10x 2 sets Holding 5# KB (farmer's carry) with  6 inch step tap 10x on each side (needs single finger touch for balance) NuStep L 3 x 5 min (blue machine)  Low to medium rating of perceived exertion end of session, back "ok"  PATIENT EDUCATION: Education details: educated on slow long breaths in through nose and long slow breaths out through pursed lips to avoid hyperventilation.  Person educated: Patient Education method: Explanation Education comprehension: verbalized understanding  HOME EXERCISE PROGRAM: To be started  GOALS: Goals reviewed with patient? Yes  SHORT TERM GOALS: Target date: 05/27/2023   The patient will demonstrate knowledge of basic self care strategies and exercises to promote strength and mobility  Baseline: Goal status: met 9/19  2.  Able to walk 2 min 375 feet with RPE 5/10 Baseline:  Goal status: partially met RPE 7/10 today but much improved distance  3.  The patient will have an improved BERG balance score to   46 /56 indicating reduced risk of falls Baseline:  Goal status: met 9/19  4.  Improved LE strength and balance  indicated by improved 5x STS to 16 sec Baseline:  Goal status: met 9/19  5.  Set DGI for MiniBESTest goal Baseline:  Goal status: met 9/19    LONG TERM  GOALS: Target date: 08/19/2023   The patient will be independent in a safe self progression of a home exercise program/gym program to promote further recovery of function  Baseline:  Goal status: MET  2.  The patient will have improved gait stamina and speed needed to ambulate 600 feet in 6 minutes with RPE of 6/10 Baseline:  Goal status: =ongoing  3.  The patient will have an improved BERG balance score to  48  /56 indicating reduced risk of falls  Baseline:  Goal status: met 9/16  4.  The patient will have improved Timed Up and Go (TUG) time to    <12   sec indicating improved gait speed and LE strength  Baseline:  Goal status: met 9/19  5.  The patient will have improved hip and knee strength to at least  4+/5 needed for standing >10 min for washing dishes Baseline:  Goal status: ongoing  6. Improved balance with DGI to 21/24 indicating decreased risk of falls  7. Crowds at [facility] and walk to car/parking lot with PSFS to 5  8. Standing for 3 minutes for parties for socializing with PSFS of 6   ASSESSMENT:  CLINICAL IMPRESSION: Pt did well with sit to stand with lighter weight today.  Close supervision for safety with dynamic ex's including resisted walking and hurdles but no loss of balance.  Pt reports that he is able to stand longer periods at home and is regularly walking his dog.   Therapist monitored response throughout session and pt is appropriately challenged.  Patient will benefit from skilled PT to address the below impairments and improve overall function.    OBJECTIVE IMPAIRMENTS: cardiopulmonary status limiting activity, decreased activity tolerance, decreased balance, decreased endurance, decreased mobility, difficulty walking, decreased strength, impaired perceived functional ability, and pain.   ACTIVITY LIMITATIONS: carrying, lifting, bending, standing, squatting, and locomotion level  PARTICIPATION LIMITATIONS: meal prep, cleaning, interpersonal relationship, shopping, and community activity  PERSONAL FACTORS: Fitness, Time since onset of injury/illness/exacerbation, and 3+ comorbidities: multiple myeloma, HTN, DDD lumbar spine  are also affecting patient's functional outcome.   REHAB POTENTIAL: Good  CLINICAL DECISION MAKING: Evolving/moderate complexity  EVALUATION COMPLEXITY: Moderate  PLAN:  PT FREQUENCY: 2x/week  PT DURATION: 8 weeks  PLANNED INTERVENTIONS: Therapeutic exercises, Therapeutic activity, Neuromuscular re-education, Balance training, Gait training, Patient/Family education, Self Care, Stair training, Aquatic Therapy, Cryotherapy, Moist heat, Manual therapy, and Re-evaluation  PLAN FOR NEXT SESSION:  resisted backward walk; general  strengthening; gradually increase time on his feet; step over obstacles for toe clearance/balance;  going to [place] at Thanksgiving.  ERO next week.  He is scheduled through 2 weeks into December.   Lorrene Reid, PT 08/11/23 10:55 AM  Phone: [phone] Fax: [phone]

## 2023-08-12 ENCOUNTER — Encounter: Payer: Self-pay | Admitting: Hematology and Oncology

## 2023-08-13 ENCOUNTER — Other Ambulatory Visit: Payer: Self-pay

## 2023-08-13 DIAGNOSIS — C9000 Multiple myeloma not having achieved remission: Secondary | ICD-10-CM

## 2023-08-13 MED ORDER — POMALIDOMIDE 3 MG PO CAPS: ORAL_CAPSULE | ORAL | 0 refills | Status: DC

## 2023-08-17 ENCOUNTER — Encounter: Payer: Self-pay | Admitting: Physical Therapy

## 2023-08-17 ENCOUNTER — Ambulatory Visit: Payer: Medicare Other | Admitting: Physical Therapy

## 2023-08-17 DIAGNOSIS — M6281 Muscle weakness (generalized): Secondary | ICD-10-CM | POA: Diagnosis not present

## 2023-08-17 DIAGNOSIS — R2689 Other abnormalities of gait and mobility: Secondary | ICD-10-CM | POA: Diagnosis not present

## 2023-08-17 DIAGNOSIS — R262 Difficulty in walking, not elsewhere classified: Secondary | ICD-10-CM | POA: Diagnosis not present

## 2023-08-17 DIAGNOSIS — R293 Abnormal posture: Secondary | ICD-10-CM | POA: Diagnosis not present

## 2023-08-17 NOTE — Therapy (Signed)
OUTPATIENT PHYSICAL THERAPY TREATMENT      Patient Name: Jesse Mcclure. MRN: [id number] DOB:1951/07/28, 72 y.o., adult Today's Date: 08/17/2023   PCP: Sharlot Gowda MD REFERRING PROVIDER: Artis Delay MD  END OF SESSION:  PT End of Session - 08/17/23 1615     Visit Number 15    Date for PT Re-Evaluation 08/19/23    Authorization Type Medicare    Progress Note Due on Visit 20    PT Start Time 1613    PT Stop Time 1655    PT Time Calculation (min) 42 min    Activity Tolerance Patient tolerated treatment well                 Past Medical History:  Diagnosis Date   BPH (benign prostatic hypertrophy)    Degenerative disc disease    Diverticulosis    Hemochromatosis    HTN (hypertension)    Hyperlipidemia    Multiple myeloma (HCC)    Polio    Status post Nissen fundoplication (without gastrostomy tube) procedure    for hiatal hernia.   Past Surgical History:  Procedure Laterality Date   FOOT SURGERY     HIATAL HERNIA REPAIR     OTHER SURGICAL HISTORY     Cataract Surgery--Both eyes   SKIN BIOPSY Right 04/13/2022   squamous cell carcinoma in stiu arising in actinic keratosis   TOTAL KNEE ARTHROPLASTY Left 07/15/2021   Procedure: TOTAL KNEE ARTHROPLASTY;  Surgeon: Teryl Lucy, MD;  Location: [facility];  Service: Orthopedics;  Laterality: Left;   Undescended testes Right 10/05/1966   Patient Active Problem List   Diagnosis Date Noted   Snoring 06/08/2023   Chronic midline low back pain without sciatica 06/01/2023   Idiopathic gout 06/01/2023   (HFpEF) heart failure with preserved ejection fraction (HCC) 05/17/2023   Shortness of breath 05/16/2023   Essential tremor 05/11/2023   Balance disorder 04/23/2023   Squamous cell carcinoma in situ 04/14/2023   Melanoma in situ (HCC) 04/14/2023   History of pulmonary embolus (PE) 09/04/2022   History of basal cell cancer 05/01/2022   First degree AV block 04/22/2022   History of skin cancer 04/21/2022    S/P TKR (total knee replacement), left 07/15/2021   Osteoarthritis of left knee 05/22/2021   Deficiency anemia 01/07/2021   ACE-inhibitor cough 12/16/2020   Tremor of both hands 10/11/2019   Peripheral neuropathy due to chemotherapy (HCC) 10/11/2019   Dry skin 10/11/2019   Insomnia disorder 09/06/2019   Loose stools 09/06/2019   S/P bone marrow transplant (HCC) 08/30/2019   Hypocalcemia 06/14/2019   Multiple myeloma without remission (HCC) 04/04/2019   Other constipation 04/04/2019   Glucose intolerance (impaired glucose tolerance) 05/20/2016   Vitamin D deficiency 05/20/2016   Chronic musculoskeletal pain 01/09/2016   History of orchiectomy, unilateral 05/20/2015   Obesity (BMI 30-39.9) 05/20/2015   Elevated PSA 05/20/2015   Arthritis 05/20/2015   Essential hypertension 03/02/2012   Hyperlipidemia 03/02/2012   Hemochromatosis, hereditary (HCC) 11/28/2008    ONSET DATE: > 1 year  REFERRING DIAG: G25.2 intention tremor; R26.89 balance disorder  THERAPY DIAG: difficulty in walking; fall risk Muscle weakness (generalized)  Difficulty in walking, not elsewhere classified  Rationale for Evaluation and Treatment: Rehabilitation  SUBJECTIVE:  SUBJECTIVE STATEMENT: My back is usually sore after PT so I took an Oxy before I came.   I feel it mostly the next day/morning.   Eval: Being treated for cancer 4-5 years including oral chemo at home;  Dr wants me to walk and I use poles b/c I'm very wobbly.  Dr. says it's not the medication causing this.  Went on cruise in [place] a few months ago but I couldn't tour and stayed on the boat. No falls but several close calls: 3-4 steps garage steps into the house, stumbled going up.  If I sit a while then get up and take a few steps, will feel wobbly.  Hold onto  something to pick up something from the floor. Denies loss of sensation in feet. I sleep a lot.  I'm very sedentary.   My partner thinks I need to move more.  I ride the stationary bike Tuesday/Thursday occasionally.  I used to have a trainer prior to getting sick. I get wheelchair service at airports. [place] trip in [place]  Considering [facility]  Goes by [name]  PERTINENT HISTORY:  Oncology History  Multiple myeloma without remission (HCC)  03/20/2019 Imaging    1. Tumor involving the L5 and S1 and S2 segments of the spine as described with mass effects upon the left L4, L5, S1 and more distal left sacral nerves   Left TKR HTN Back pain  PAIN:  Are you having pain? Yes  NPRS scale:  3/10 Pain location: LBP   Aggravating factors: standing in the kitchen Relieving factors: ibuprofen, oxy if it gets bad   PRECAUTIONS: Fall    WEIGHT BEARING RESTRICTIONS: No  FALLS: Has patient fallen in last 6 months? No  LIVING ENVIRONMENT: Lives with: partner Lives in: House/apartment 2 story main living downstairs Stairs: Yes: Internal: 10 steps; on right going up and External: 3 steps; on left going up Has following equipment at home: walking poles   PLOF: partner does cooking, pt does some grilling Enjoys being with friends, hanging out; doesn't go to social events  b/c I can't stand for long time 10 minutes  PATIENT GOALS: I want to walk better, stand longer; going to [place] in May for 11 days; ride bicycles there;  I'd like to not have to use wheelchair service at the airport  The Patient-Specific Functional Scale  Initial:  I am going to ask you to identify up to 3 important activities that you are unable to do or are having difficulty with as a result of this problem.  Today are there any activities that you are unable to do or having difficulty with because of this?  (Patient shown scale and patient rated each activity)  Follow up: When you first came in you had difficulty  performing these activities.  Today do you still have difficulty?  Patient-Specific activity scoring scheme (Point to one number):  0 1 2 3 4 5 6 7 8 9  10 Unable  Able to perform To perform                                                                                                    activity at the same Activity         Level as before                                                                                                                       Injury or problem  Activity     Standing for parties                                    Initial:       4                2.        Going to [employer] show                              Initial:    3                      OBJECTIVE:    COGNITION: Overall cognitive status: Within functional limits for tasks assessed    LOWER EXTREMITY ROM:   grossly WFLs  LOWER EXTREMITY MMT:    MMT Right Eval Right  07/20/23 Left Eval Left  07/20/23  Hip flexion  4  4  Hip extension      Hip abduction 4 4+ 4 4+  Hip adduction      Hip internal rotation      Hip external rotation      Knee flexion      Knee extension 4 4 4 4   Ankle dorsiflexion 4 4 4 4   Ankle plantarflexion 4 4 4 4   Ankle inversion      Ankle eversion      (Blank rows = not tested)           TRUNK STRENGTH:  Decreased activation of transverse abdominus muscles; abdominals 4-/5; decreased activation of lumbar multifidi; trunk extensors 4-/5 07/20/23:  Improved to 4/5                                  GAIT: Comments: wider base of support; decreased step length; decreased dorsiflexion  FUNCTIONAL TESTS:  5x sit to stand: 20.31 no hands TUG 12.11  sec no hands 2 min walk test 366 feet RPE 7/10 BERG balance test:  43/56 indicating a 50% risk of falls; most challenged by single limb standing or narrow base of support; decreased speed of movement, limited forward  reach  9/19: 5 sit to stand 12.33 no hands TUG 11.61 no hands BERG: 49/56 Dynamic Gait Index: 18/24 difficulty with head turns and head extension  10/15: 5 sit to stand 12.98 no hands TUG 7.53 sec no hands BERG: 49/56 2 min walk test: 420.7 feet with RPE of 7/10  TODAY'S TREATMENT:       DATE:   08/17/23 2nd step hip flexor stretch 10x right/left Heel raises x10 Step taps 10x Seated 4# plyo ball chops 15x Standing lat bar 30# 15x 2 sets Leg press: seat 9 80#  bil 10x; 40# single leg right/left  next time recline seat back more Resisted cable walk 15# 10x Discussion of continued gym program after PT ([facility]?) NuStep L 3 x 5 min (blue machine)-PT present to discuss progress 317 feet in 2 min 92% HR 85 RPE 8/10  2 min recovered to 94%   08/11/23 2nd step hip flexor stretch 10x right/left Seated hamstring stretch 10" hold x10 Heel raises 2x10 Resisted cable walk 15# 10x Sit to stands plus 1 cushion (elevated seat height secondary to knee pain): holding 5# KB at chest level 2x1030# standing lat bar 10x 2 sets Standing diagonals with 5# kettlebell: hip to shoulder x10 bil each Hurdles at counter top: step-to forward and lateral with min UE support with Ues.   5# kettle bell (Farmer[employer] Carry) walk down hallway between ortho and cancer gym, around cancer gym and back- 1 lap each arm NuStep L 3 x 5 min (blue machine)-PT present to discuss progress 08/09/23 2nd step hip flexor stretch 10x right/left Seated hamstring stretch 10" hold x10 Heel raises 2x10 Resisted cable walk 15# 10x Sit to stands plus 1 cushion (elevated seat height secondary to knee pain): holding 15# KB at chest level 3x5- fatigued by this weight and could only do sets of 5.  30# standing lat bar 10x 2 sets Holding 5# KB (farmer's carry) with 6 inch step-up 10x on each side  5# kettle bell (Farmer[employer] Carry) walk down hallway between ortho and cancer gym, around cancer gym and back- 1 lap each arm NuStep L 3 x 5 min  (blue machine)-PT present to discuss progress RPE 7-8/10 at end of session  PATIENT EDUCATION: Education details: educated on slow long breaths in through nose and long slow breaths out through pursed lips to avoid hyperventilation.  Person educated: Patient Education method: Explanation Education comprehension: verbalized understanding  HOME EXERCISE PROGRAM: To be started  GOALS: Goals reviewed with patient? Yes  SHORT TERM GOALS: Target date: 05/27/2023   The patient will demonstrate knowledge of basic self care strategies and exercises to promote strength and mobility  Baseline: Goal status: met 9/19  2.  Able to walk 2 min 375 feet with RPE 5/10 Baseline:  Goal status: partially met RPE 7/10 today but much improved distance  3.  The patient will have an improved BERG balance score to   46 /56 indicating reduced risk of falls Baseline:  Goal status: met 9/19  4.  Improved LE strength and balance  indicated by improved 5x STS to 16 sec Baseline:  Goal status: met 9/19  5.  Set DGI for MiniBESTest goal Baseline:  Goal status: met 9/19    LONG TERM GOALS: Target date: 08/19/2023  The patient will be independent in a safe self progression of a home exercise program/gym program to promote further recovery of function  Baseline:  Goal status: MET  2.  The patient will have improved gait stamina and speed needed to ambulate 600 feet in 6 minutes with RPE of 6/10 Baseline:  Goal status: =ongoing  3.  The patient will have an improved BERG balance score to  48  /56 indicating reduced risk of falls  Baseline:  Goal status: met 9/16  4.  The patient will have improved Timed Up and Go (TUG) time to    <12   sec indicating improved gait speed and LE strength  Baseline:  Goal status: met 9/19  5.  The patient will have improved hip and knee strength to at least 4+/5 needed for standing >10 min for washing dishes Baseline:  Goal status: ongoing  6. Improved  balance with DGI to 21/24 indicating decreased risk of falls  7. Crowds at [facility] and walk to car/parking lot with PSFS to 5  8. Standing for 3 minutes for parties for socializing with PSFS of 6   ASSESSMENT:  CLINICAL IMPRESSION: Improved standing tolerance although needs intermittent seated rest breaks secondary to shortness of breath and to relieve back pain.  He is challenged by the leg press (particularly with set up with 2 leg-press) although the motion is less painful on the knees than sit to stand.  Walking continues to be difficult with a high rating of perceived exertion 8/10 after.  He does recover quickly upon rest.  Therapist monitoring response and modifying treatment accordingly.     OBJECTIVE IMPAIRMENTS: cardiopulmonary status limiting activity, decreased activity tolerance, decreased balance, decreased endurance, decreased mobility, difficulty walking, decreased strength, impaired perceived functional ability, and pain.   ACTIVITY LIMITATIONS: carrying, lifting, bending, standing, squatting, and locomotion level  PARTICIPATION LIMITATIONS: meal prep, cleaning, interpersonal relationship, shopping, and community activity  PERSONAL FACTORS: Fitness, Time since onset of injury/illness/exacerbation, and 3+ comorbidities: multiple myeloma, HTN, DDD lumbar spine  are also affecting patient's functional outcome.   REHAB POTENTIAL: Good  CLINICAL DECISION MAKING: Evolving/moderate complexity  EVALUATION COMPLEXITY: Moderate  PLAN:  PT FREQUENCY: 2x/week  PT DURATION: 8 weeks  PLANNED INTERVENTIONS: Therapeutic exercises, Therapeutic activity, Neuromuscular re-education, Balance training, Gait training, Patient/Family education, Self Care, Stair training, Aquatic Therapy, Cryotherapy, Moist heat, Manual therapy, and Re-evaluation  PLAN FOR NEXT SESSION: KX; ERO next visit; leg press recline seat:  give [employer] program;  resisted backward walk; general  strengthening; gradually increase time on his feet; step over obstacles for toe clearance/balance;  going to [place] at Thanksgiving.   He is scheduled through 2 weeks into December.   Lavinia Sharps, PT 08/17/23 4:55 PM Phone: [phone] Fax: [phone]

## 2023-08-19 ENCOUNTER — Ambulatory Visit: Payer: Medicare Other | Admitting: Physical Therapy

## 2023-08-19 DIAGNOSIS — R262 Difficulty in walking, not elsewhere classified: Secondary | ICD-10-CM

## 2023-08-19 DIAGNOSIS — R2689 Other abnormalities of gait and mobility: Secondary | ICD-10-CM | POA: Diagnosis not present

## 2023-08-19 DIAGNOSIS — M6281 Muscle weakness (generalized): Secondary | ICD-10-CM | POA: Diagnosis not present

## 2023-08-19 DIAGNOSIS — R293 Abnormal posture: Secondary | ICD-10-CM | POA: Diagnosis not present

## 2023-08-19 NOTE — Therapy (Signed)
OUTPATIENT PHYSICAL THERAPY TREATMENT/  RECERTIFICATION      Patient Name: Jesse Mcclure. MRN: [id number] DOB:02-28-1951, 72 y.o., adult Today's Date: 08/19/2023   PCP: Sharlot Gowda MD REFERRING PROVIDER: Artis Delay MD  END OF SESSION:  PT End of Session - 08/19/23 0938     Visit Number 16    Date for PT Re-Evaluation 10/14/23    Authorization Type Medicare    Progress Note Due on Visit 20    PT Start Time 0936    PT Stop Time 1014    PT Time Calculation (min) 38 min    Activity Tolerance Patient tolerated treatment well                 Past Medical History:  Diagnosis Date   BPH (benign prostatic hypertrophy)    Degenerative disc disease    Diverticulosis    Hemochromatosis    HTN (hypertension)    Hyperlipidemia    Multiple myeloma (HCC)    Polio    Status post Nissen fundoplication (without gastrostomy tube) procedure    for hiatal hernia.   Past Surgical History:  Procedure Laterality Date   FOOT SURGERY     HIATAL HERNIA REPAIR     OTHER SURGICAL HISTORY     Cataract Surgery--Both eyes   SKIN BIOPSY Right 04/13/2022   squamous cell carcinoma in stiu arising in actinic keratosis   TOTAL KNEE ARTHROPLASTY Left 07/15/2021   Procedure: TOTAL KNEE ARTHROPLASTY;  Surgeon: Teryl Lucy, MD;  Location: [facility];  Service: Orthopedics;  Laterality: Left;   Undescended testes Right 10/05/1966   Patient Active Problem List   Diagnosis Date Noted   Snoring 06/08/2023   Chronic midline low back pain without sciatica 06/01/2023   Idiopathic gout 06/01/2023   (HFpEF) heart failure with preserved ejection fraction (HCC) 05/17/2023   Shortness of breath 05/16/2023   Essential tremor 05/11/2023   Balance disorder 04/23/2023   Squamous cell carcinoma in situ 04/14/2023   Melanoma in situ (HCC) 04/14/2023   History of pulmonary embolus (PE) 09/04/2022   History of basal cell cancer 05/01/2022   First degree AV block 04/22/2022   History of skin  cancer 04/21/2022   S/P TKR (total knee replacement), left 07/15/2021   Osteoarthritis of left knee 05/22/2021   Deficiency anemia 01/07/2021   ACE-inhibitor cough 12/16/2020   Tremor of both hands 10/11/2019   Peripheral neuropathy due to chemotherapy (HCC) 10/11/2019   Dry skin 10/11/2019   Insomnia disorder 09/06/2019   Loose stools 09/06/2019   S/P bone marrow transplant (HCC) 08/30/2019   Hypocalcemia 06/14/2019   Multiple myeloma without remission (HCC) 04/04/2019   Other constipation 04/04/2019   Glucose intolerance (impaired glucose tolerance) 05/20/2016   Vitamin D deficiency 05/20/2016   Chronic musculoskeletal pain 01/09/2016   History of orchiectomy, unilateral 05/20/2015   Obesity (BMI 30-39.9) 05/20/2015   Elevated PSA 05/20/2015   Arthritis 05/20/2015   Essential hypertension 03/02/2012   Hyperlipidemia 03/02/2012   Hemochromatosis, hereditary (HCC) 11/28/2008    ONSET DATE: > 1 year  REFERRING DIAG: G25.2 intention tremor; R26.89 balance disorder  THERAPY DIAG: difficulty in walking; fall risk No diagnosis found.  Rationale for Evaluation and Treatment: Rehabilitation  SUBJECTIVE:  SUBJECTIVE STATEMENT: I fell asleep in the chair this morning.  My back a little achey after PT but I expect that.    Eval: Being treated for cancer 4-5 years including oral chemo at home;  Dr wants me to walk and I use poles b/c I'm very wobbly.  Dr. says it's not the medication causing this.  Went on cruise in [place] a few months ago but I couldn't tour and stayed on the boat. No falls but several close calls: 3-4 steps garage steps into the house, stumbled going up.  If I sit a while then get up and take a few steps, will feel wobbly.  Hold onto something to pick up something from the floor.  Denies loss of sensation in feet. I sleep a lot.  I'm very sedentary.   My partner thinks I need to move more.  I ride the stationary bike Tuesday/Thursday occasionally.  I used to have a trainer prior to getting sick. I get wheelchair service at airports. [place] trip in [place]  Considering [facility]  Goes by [name]  PERTINENT HISTORY:  Oncology History  Multiple myeloma without remission (HCC)  03/20/2019 Imaging    1. Tumor involving the L5 and S1 and S2 segments of the spine as described with mass effects upon the left L4, L5, S1 and more distal left sacral nerves   Left TKR HTN Back pain  PAIN:  Are you having pain? Yes  NPRS scale:  3/10 Pain location: LBP   Aggravating factors: standing in the kitchen Relieving factors: ibuprofen, oxy if it gets bad   PRECAUTIONS: Fall    WEIGHT BEARING RESTRICTIONS: No  FALLS: Has patient fallen in last 6 months? No  LIVING ENVIRONMENT: Lives with: partner Lives in: House/apartment 2 story main living downstairs Stairs: Yes: Internal: 10 steps; on right going up and External: 3 steps; on left going up Has following equipment at home: walking poles   PLOF: partner does cooking, pt does some grilling Enjoys being with friends, hanging out; doesn't go to social events  b/c I can't stand for long time 10 minutes  PATIENT GOALS: I want to walk better, stand longer; going to [place] in May for 11 days; ride bicycles there;  I'd like to not have to use wheelchair service at the airport  The Patient-Specific Functional Scale  Initial:  I am going to ask you to identify up to 3 important activities that you are unable to do or are having difficulty with as a result of this problem.  Today are there any activities that you are unable to do or having difficulty with because of this?  (Patient shown scale and patient rated each activity)  Follow up: When you first came in you had difficulty performing these activities.  Today do you still have  difficulty?  Patient-Specific activity scoring scheme (Point to one number):  0 1 2 3 4 5 6 7 8 9  10 Unable  Able to perform To perform                                                                                                    activity at the same Activity         Level as before                                                                                                                       Injury or problem  Activity     Standing for parties                                    Initial:       4               11/14:   8/10 much improved! 2.        Going to [employer] show                              Initial:    3                  11/14:  8/10 improved!    OBJECTIVE:    COGNITION: Overall cognitive status: Within functional limits for tasks assessed    LOWER EXTREMITY ROM:   grossly WFLs  LOWER EXTREMITY MMT:    MMT Right Eval Right  07/20/23 Left Eval Left  07/20/23  Hip flexion  4  4  Hip extension      Hip abduction 4 4+ 4 4+  Hip adduction      Hip internal rotation      Hip external rotation      Knee flexion      Knee extension 4 4 4 4   Ankle dorsiflexion 4 4 4 4   Ankle plantarflexion 4 4 4 4   Ankle inversion      Ankle eversion      (Blank rows = not tested)           TRUNK STRENGTH:  Decreased activation of transverse abdominus muscles; abdominals 4-/5; decreased activation of lumbar multifidi; trunk extensors 4-/5 07/20/23:  Improved to 4/5                                  GAIT: Comments: wider base of support; decreased step length; decreased dorsiflexion  FUNCTIONAL TESTS:  5x  sit to stand: 20.31 no hands TUG 12.11 sec no hands 2 min walk test 366 feet RPE 7/10 BERG balance test:  43/56 indicating a 50% risk of falls; most challenged by single limb standing or narrow base of support; decreased speed of movement, limited forward  reach  9/19: 5 sit to stand 12.33 no hands TUG 11.61 no hands BERG: 49/56 Dynamic Gait Index: 18/24 difficulty with head turns and head extension  10/15: 5 sit to stand 12.98 no hands TUG 7.53 sec no hands BERG: 49/56 2 min walk test: 420.7 feet with RPE of 7/10  11/14: 12.72 5x STS TUG 8.37 6 MWT 866 1 rest break RPE 5/10 O2 95% HR85  MINI-BESTest: Balance Evaluation Systems Test ANTICIPATORY: SIT TO STAND: (2) normal without use of hands and stabilizes independently     (1) Moderate comes to stand WITH use of hands on 1st attempt    (0) Severe: unable to stand up from chair without assistance OR needs several attempts with use of   hands Score:2  2. RISE TO TOES: feet shoulder width apart. Hands on hips.  Rise as high as you can onto your toes. Try to hold this pose for 3 sec                          (2) stable for 3s with maximum height    (1) heels up, but not full range (smaller than when holding hands) OR noticeable instability for 3 sec                           (0) < or equat to 3 s  Score: 1  3. STAND ON ONE LEG: look straight ahead. Hands on hips. Lift your leg off the ground.     Left:  trial 1 __2__ trial 2__2__                                    Right: Trial 1:___2__   Trial 2:___2____   (2) 20 s       (2)  20 s                (1) < 20      (1) < 20 s   (0) Unable      (0) unable Score (use the worst side): 1  REACTIVE POSTURAL CONTROL 4. COMPENSATORY STEPPING CORRECTION FORWARD stand with feet shoulder width apart, arms at your sides. Lean forward against my hands beyond your forward limits.  When I let go, do whatever is necessary, including taking a step, to avoid a fall   (2) recovers independently  with a single, large step, to avoid a fall   (1) more than one step used to recover equilibrium   (0) No step OR would fall if not caught Score: 1  5. COMPENSATORY STEPPING CORRECTION BACKWARD: Lean backward against my hands beyond your backward limits.  When  I let go, do whatever is necessary, including a step to avoid a fall. (2) recovers independently  with a single, large step, to avoid a fall   (1) more than one step used to recover equilibrium   (0) No step OR would fall if not caught Score: 1  6. COMPENSATORY STEPPING LATERAL: Lean into my hand beyond your sideways limit.  When I let go, do whatever  is necessary, including taking a step, to avoid a fall. Left           Right   (2) recovers independently with 1 step (crossover or lateral OK)   (1) several steps to recover equilibrium   (0) falls or cannot step Score: (use the side with the lowest score)  0  SENSORY ORIENTATION 7. STANCE FEET TOGETHER EYES OPEN  firm surface Be as stable and still as possible until I say stop   (2) 30s   (1) < 30 s   (0) unable Score: 2  8. STANCE ON FOAM EYES CLOSED feet together, eyes closed, hands on hips. Be as stable and still as you can until I stay stop.  I will start timing when you close your eyes. Time in seconds:      (2) 30s   (1) <30 s   (0) unable Score: 1  9. INCLINE EYES CLOSED: Stand on incline with feet shoulder width apart, arms down at your sides.  I will start timing when you close your eyes   (2) Stands 30 s and aligns with gravity   (1) stands < 30 s OR aligns with surface   (0) unable Score: 2  DYNAMIC GAIT 10. CHANGE IN GAIT SPEED: begin walking at your normal speed, when I tell you "fast", walk as fast as you can, when I say "slow" walk very slowly   (2) significantly changes walking speed without imbalance   (1) unable to change walking speed or signs of imbalance   (0) unable to achieve significant change in walking speed AND signes of imbalance Score: 2  11. WALK WITH HEAD TURNS HORIZONTAL: begin walking at normal speed, when I say right, turn your head to the right, when I say left turn your head to the left.  Try to keep yourself walking in a straight line   (2) performs head turns with no change in gait speed  and good balance   (1) performs head turns with reduction in gait speed   (0)  performs head turns with imbalance Score: 2  12. WALK WITH PIVOT TURNS: begin walking at your normal speed.  When I tell you to "turn and stop", turn as quickly as you can, face the opposite direction and stop.  After the turn, your feet should be close together   (2) turns with feet close FAST in 3 steps with good balance   (1) turns with feet close SLOW > 4 steps with good balance   (0) cannot turn with feet close at any speed without imbalance Score: 2  13. STEP OVER OBSTACLES:  ( 9 inch height 10 feet away from subject) begin walking at your normal speed.  When you get to the box, step over it, not around it and keep walking   (2) Able to step over box with minimal change of gait speed and good balance   (1) Steps over box but touches box OR displays cautious behavior by slowing gait   (0) Unable to step over box OR steps around box Score: 1  14. TIMED UP AND GO NORMAL AND WITH DUAL TASK: 3 METER WALK: When I say go walk at your your normal speed across the tape, turn around and come back to sit in the chair   Time: Count backwards by 3s starting at    20.  When I say go, stand up from the chair, walk at your normal speed across the tape on the  floor, turn around, come back to sit in the chair.  Continue counting backwards the entire time.  Time:   (2) No noticeable change in sitting, standing or walking while backward counting when compared to TUG without dual task   (1) Dual task affects either counting OR walking (>10%)    (0) stops counting while walking OR stops walking while counting If gait speed slows more than 10% between TUG without and with dual task, the score should be decreased by a point Score:  1  Total score:         19  /28       TODAY'S TREATMENT:       DATE:   08/19/23 NuStep L 3 x 5 min (blue machine)-PT present to discuss progress 5x STS TUG 6 MWT Mini best  test PSFS Review of progress toward goals 08/17/23 2nd step hip flexor stretch 10x right/left Heel raises x10 Step taps 10x Seated 4# plyo ball chops 15x Standing lat bar 30# 15x 2 sets Leg press: seat 9 80#  bil 10x; 40# single leg right/left  next time recline seat back more Resisted cable walk 15# 10x Discussion of continued gym program after PT ([facility]?) NuStep L 3 x 5 min (blue machine)-PT present to discuss progress 317 feet in 2 min 92% HR 85 RPE 8/10  2 min recovered to 94%   08/11/23 2nd step hip flexor stretch 10x right/left Seated hamstring stretch 10" hold x10 Heel raises 2x10 Resisted cable walk 15# 10x Sit to stands plus 1 cushion (elevated seat height secondary to knee pain): holding 5# KB at chest level 2x1030# standing lat bar 10x 2 sets Standing diagonals with 5# kettlebell: hip to shoulder x10 bil each Hurdles at counter top: step-to forward and lateral with min UE support with Ues.   5# kettle bell (Farmer's Carry) walk down hallway between ortho and cancer gym, around cancer gym and back- 1 lap each arm NuStep L 3 x 5 min (blue machine)-PT present to discuss progress 08/09/23 2nd step hip flexor stretch 10x right/left Seated hamstring stretch 10" hold x10 Heel raises 2x10 Resisted cable walk 15# 10x Sit to stands plus 1 cushion (elevated seat height secondary to knee pain): holding 15# KB at chest level 3x5- fatigued by this weight and could only do sets of 5.  30# standing lat bar 10x 2 sets Holding 5# KB (farmer's carry) with 6 inch step-up 10x on each side  5# kettle bell (Farmer's Carry) walk down hallway between ortho and cancer gym, around cancer gym and back- 1 lap each arm NuStep L 3 x 5 min (blue machine)-PT present to discuss progress RPE 7-8/10 at end of session  PATIENT EDUCATION: Education details: educated on slow long breaths in through nose and long slow breaths out through pursed lips to avoid hyperventilation.  Person educated:  Patient Education method: Explanation Education comprehension: verbalized understanding  HOME EXERCISE PROGRAM: To be started  GOALS: Goals reviewed with patient? Yes  SHORT TERM GOALS: Target date: 05/27/2023   The patient will demonstrate knowledge of basic self care strategies and exercises to promote strength and mobility  Baseline: Goal status: met 9/19  2.  Able to walk 2 min 375 feet with RPE 5/10 Baseline:  Goal status: partially met RPE 7/10 today but much improved distance  3.  The patient will have an improved BERG balance score to   46 /56 indicating reduced risk of falls Baseline:  Goal status: met 9/19  4.  Improved LE strength and balance  indicated by improved 5x STS to 16 sec Baseline:  Goal status: met 9/19  5.  Set DGI for MiniBESTest goal Baseline:  Goal status: met 9/19    LONG TERM GOALS: Target date: 10/14/2023   The patient will be independent in a safe self progression of a home exercise program/gym program to promote further recovery of function  Baseline:  Goal status: MET  2.  The patient will have improved gait stamina and speed needed to ambulate 600 feet in 6 minutes with RPE of 6/10 Baseline:  Goal status: =met 11/14  3.  The patient will have an improved BERG balance score to  48  /56 indicating reduced risk of falls  Baseline:  Goal status: met 9/16  4.  The patient will have improved Timed Up and Go (TUG) time to    <12   sec indicating improved gait speed and LE strength  Baseline:  Goal status: met 9/19  5.  The patient will have improved hip and knee strength to at least 4+/5 needed for standing >10 min for washing dishes Baseline:  Goal status: ongoing  6. Improved balance with DGI to 21/24 indicating decreased risk of falls  7. Crowds at [facility] and walk to car/parking lot with PSFS to 5 Met 11/14 8. Standing for 3 minutes for parties for socializing with PSFS of 6 Met 11/14  9. Mini best test improved to 23/28  indicating improved balance and dec risk of falls  10.  The patient will have improved gait stamina and speed needed to ambulate 950 feet in 6 minutes     ASSESSMENT:  CLINICAL IMPRESSION: Good improvements in 5x STS, TUG and 6 MWT compared to initial evaluation.  His self rated functional impairment has significantly improved with standing time from a 3 to an 8/10.  His ability to attend events at the theater has also improved to an 8/10 meeting this LTG. Mini best test indicates moderate risk of falls with reactionary balance and sensory orientation the most impaired.  The patient would benefit from a continuation of skilled PT for a further progression of balance, strengthening and functional mobility.  Will continue to update and promote independence in a HEPand/or gym program needed for a return to the highest functional level possible with ADLs.      OBJECTIVE IMPAIRMENTS: cardiopulmonary status limiting activity, decreased activity tolerance, decreased balance, decreased endurance, decreased mobility, difficulty walking, decreased strength, impaired perceived functional ability, and pain.   ACTIVITY LIMITATIONS: carrying, lifting, bending, standing, squatting, and locomotion level  PARTICIPATION LIMITATIONS: meal prep, cleaning, interpersonal relationship, shopping, and community activity  PERSONAL FACTORS: Fitness, Time since onset of injury/illness/exacerbation, and 3+ comorbidities: multiple myeloma, HTN, DDD lumbar spine  are also affecting patient's functional outcome.   REHAB POTENTIAL: Good  CLINICAL DECISION MAKING: Evolving/moderate complexity  EVALUATION COMPLEXITY: Moderate  PLAN:  PT FREQUENCY: 2x/week  PT DURATION: 8 weeks  PLANNED INTERVENTIONS: Therapeutic exercises, Therapeutic activity, Neuromuscular re-education, Balance training, Gait training, Patient/Family education, Self Care, Stair training, Aquatic Therapy, Cryotherapy, Moist heat, Manual therapy, and  Re-evaluation  PLAN FOR NEXT SESSION: KX;  leg press recline seat:  dual task walking, reactionary balance, eyes closed and foam balance; give [employer] program;  resisted backward walk; general strengthening; gradually increase time on his feet; step over obstacles for toe clearance/balance;  going to [place] at Thanksgiving.    Lavinia Sharps, PT 08/19/23 5:47 PM Phone: [phone] Fax: [phone]

## 2023-08-20 ENCOUNTER — Other Ambulatory Visit: Payer: Self-pay | Admitting: Family Medicine

## 2023-08-20 DIAGNOSIS — I10 Essential (primary) hypertension: Secondary | ICD-10-CM

## 2023-08-23 ENCOUNTER — Other Ambulatory Visit: Payer: Self-pay

## 2023-08-23 ENCOUNTER — Other Ambulatory Visit (HOSPITAL_COMMUNITY): Payer: Self-pay

## 2023-08-23 ENCOUNTER — Other Ambulatory Visit: Payer: Self-pay | Admitting: Physician Assistant

## 2023-08-23 ENCOUNTER — Telehealth: Payer: Self-pay

## 2023-08-23 MED ORDER — OXYCODONE HCL 5 MG PO TABS
5.0000 mg | ORAL_TABLET | ORAL | 0 refills | Status: DC | PRN
  Filled 2023-08-23: qty 60, 10d supply, fill #0

## 2023-08-23 NOTE — Progress Notes (Signed)
Refill for oxycodone sent to pharmacy per patient's request. I have reviewed the PDMP during this encounter.

## 2023-08-23 NOTE — Telephone Encounter (Signed)
Returned his call. He is requesting Oxycodone refill to [facility]. He is completely out of oxycodone.

## 2023-08-24 ENCOUNTER — Ambulatory Visit: Payer: Medicare Other | Admitting: Physical Therapy

## 2023-08-24 DIAGNOSIS — M6281 Muscle weakness (generalized): Secondary | ICD-10-CM

## 2023-08-24 DIAGNOSIS — R262 Difficulty in walking, not elsewhere classified: Secondary | ICD-10-CM | POA: Diagnosis not present

## 2023-08-24 DIAGNOSIS — R2689 Other abnormalities of gait and mobility: Secondary | ICD-10-CM | POA: Diagnosis not present

## 2023-08-24 DIAGNOSIS — R293 Abnormal posture: Secondary | ICD-10-CM | POA: Diagnosis not present

## 2023-08-24 NOTE — Therapy (Signed)
OUTPATIENT PHYSICAL THERAPY TREATMENT      Patient Name: Jesse Mcclure. MRN: [id number] DOB:01-06-1951, 72 y.o., adult Today's Date: 08/24/2023   PCP: Sharlot Gowda MD REFERRING PROVIDER: Artis Delay MD  END OF SESSION:  PT End of Session - 08/24/23 0924     Visit Number 17    Date for PT Re-Evaluation 10/14/23    Progress Note Due on Visit 20    PT Start Time 0925    PT Stop Time 1010    PT Time Calculation (min) 45 min    Activity Tolerance Patient tolerated treatment well                 Past Medical History:  Diagnosis Date   BPH (benign prostatic hypertrophy)    Degenerative disc disease    Diverticulosis    Hemochromatosis    HTN (hypertension)    Hyperlipidemia    Multiple myeloma (HCC)    Polio    Status post Nissen fundoplication (without gastrostomy tube) procedure    for hiatal hernia.   Past Surgical History:  Procedure Laterality Date   FOOT SURGERY     HIATAL HERNIA REPAIR     OTHER SURGICAL HISTORY     Cataract Surgery--Both eyes   SKIN BIOPSY Right 04/13/2022   squamous cell carcinoma in stiu arising in actinic keratosis   TOTAL KNEE ARTHROPLASTY Left 07/15/2021   Procedure: TOTAL KNEE ARTHROPLASTY;  Surgeon: Teryl Lucy, MD;  Location: [facility];  Service: Orthopedics;  Laterality: Left;   Undescended testes Right 10/05/1966   Patient Active Problem List   Diagnosis Date Noted   Snoring 06/08/2023   Chronic midline low back pain without sciatica 06/01/2023   Idiopathic gout 06/01/2023   (HFpEF) heart failure with preserved ejection fraction (HCC) 05/17/2023   Shortness of breath 05/16/2023   Essential tremor 05/11/2023   Balance disorder 04/23/2023   Squamous cell carcinoma in situ 04/14/2023   Melanoma in situ (HCC) 04/14/2023   History of pulmonary embolus (PE) 09/04/2022   History of basal cell cancer 05/01/2022   First degree AV block 04/22/2022   History of skin cancer 04/21/2022   S/P TKR (total knee replacement),  left 07/15/2021   Osteoarthritis of left knee 05/22/2021   Deficiency anemia 01/07/2021   ACE-inhibitor cough 12/16/2020   Tremor of both hands 10/11/2019   Peripheral neuropathy due to chemotherapy (HCC) 10/11/2019   Dry skin 10/11/2019   Insomnia disorder 09/06/2019   Loose stools 09/06/2019   S/P bone marrow transplant (HCC) 08/30/2019   Hypocalcemia 06/14/2019   Multiple myeloma without remission (HCC) 04/04/2019   Other constipation 04/04/2019   Glucose intolerance (impaired glucose tolerance) 05/20/2016   Vitamin D deficiency 05/20/2016   Chronic musculoskeletal pain 01/09/2016   History of orchiectomy, unilateral 05/20/2015   Obesity (BMI 30-39.9) 05/20/2015   Elevated PSA 05/20/2015   Arthritis 05/20/2015   Essential hypertension 03/02/2012   Hyperlipidemia 03/02/2012   Hemochromatosis, hereditary (HCC) 11/28/2008    ONSET DATE: > 1 year  REFERRING DIAG: G25.2 intention tremor; R26.89 balance disorder  THERAPY DIAG: difficulty in walking; fall risk Muscle weakness (generalized)  Difficulty in walking, not elsewhere classified  Balance disorder  Rationale for Evaluation and Treatment: Rehabilitation  SUBJECTIVE:  SUBJECTIVE STATEMENT: My mobility was good at the event Friday night.  I started back on my chemo pill which causes me to toss and turn at night and be hot, drink a lot of water.    Eval: Being treated for cancer 4-5 years including oral chemo at home;  Dr wants me to walk and I use poles b/c I'm very wobbly.  Dr. says it's not the medication causing this.  Went on cruise in [place] a few months ago but I couldn't tour and stayed on the boat. No falls but several close calls: 3-4 steps garage steps into the house, stumbled going up.  If I sit a while then get up and take a  few steps, will feel wobbly.  Hold onto something to pick up something from the floor. Denies loss of sensation in feet. I sleep a lot.  I'm very sedentary.   My partner thinks I need to move more.  I ride the stationary bike Tuesday/Thursday occasionally.  I used to have a trainer prior to getting sick. I get wheelchair service at airports. [place] trip in [place]  Considering [facility]  Goes by [name]  PERTINENT HISTORY:  Oncology History  Multiple myeloma without remission (HCC)  03/20/2019 Imaging    1. Tumor involving the L5 and S1 and S2 segments of the spine as described with mass effects upon the left L4, L5, S1 and more distal left sacral nerves   Left TKR HTN Back pain  PAIN:  Are you having pain? Yes  NPRS scale:  3/10 Pain location: LBP   Aggravating factors: standing in the kitchen Relieving factors: ibuprofen, oxy if it gets bad   PRECAUTIONS: Fall    WEIGHT BEARING RESTRICTIONS: No  FALLS: Has patient fallen in last 6 months? No  LIVING ENVIRONMENT: Lives with: partner Lives in: House/apartment 2 story main living downstairs Stairs: Yes: Internal: 10 steps; on right going up and External: 3 steps; on left going up Has following equipment at home: walking poles   PLOF: partner does cooking, pt does some grilling Enjoys being with friends, hanging out; doesn't go to social events  b/c I can't stand for long time 10 minutes  PATIENT GOALS: I want to walk better, stand longer; going to [place] in May for 11 days; ride bicycles there;  I'd like to not have to use wheelchair service at the airport  The Patient-Specific Functional Scale  Initial:  I am going to ask you to identify up to 3 important activities that you are unable to do or are having difficulty with as a result of this problem.  Today are there any activities that you are unable to do or having difficulty with because of this?  (Patient shown scale and patient rated each activity)  Follow up: When you  first came in you had difficulty performing these activities.  Today do you still have difficulty?  Patient-Specific activity scoring scheme (Point to one number):  0 1 2 3 4 5 6 7 8 9  10 Unable  Able to perform To perform                                                                                                    activity at the same Activity         Level as before                                                                                                                       Injury or problem  Activity     Standing for parties                                    Initial:       4               11/14:   8/10 much improved! 2.        Going to [employer] show                              Initial:    3                  11/14:  8/10 improved!    OBJECTIVE:    COGNITION: Overall cognitive status: Within functional limits for tasks assessed    LOWER EXTREMITY ROM:   grossly WFLs  LOWER EXTREMITY MMT:    MMT Right Eval Right  07/20/23 Left Eval Left  07/20/23  Hip flexion  4  4  Hip extension      Hip abduction 4 4+ 4 4+  Hip adduction      Hip internal rotation      Hip external rotation      Knee flexion      Knee extension 4 4 4 4   Ankle dorsiflexion 4 4 4 4   Ankle plantarflexion 4 4 4 4   Ankle inversion      Ankle eversion      (Blank rows = not tested)           TRUNK STRENGTH:  Decreased activation of transverse abdominus muscles; abdominals 4-/5; decreased activation of lumbar multifidi; trunk extensors 4-/5 07/20/23:  Improved to 4/5                                  GAIT: Comments: wider base of support; decreased step length; decreased dorsiflexion  FUNCTIONAL TESTS:  5x  sit to stand: 20.31 no hands TUG 12.11 sec no hands 2 min walk test 366 feet RPE 7/10 BERG balance test:  43/56 indicating a 50% risk of falls; most challenged by single limb  standing or narrow base of support; decreased speed of movement, limited forward reach  9/19: 5 sit to stand 12.33 no hands TUG 11.61 no hands BERG: 49/56 Dynamic Gait Index: 18/24 difficulty with head turns and head extension  10/15: 5 sit to stand 12.98 no hands TUG 7.53 sec no hands BERG: 49/56 2 min walk test: 420.7 feet with RPE of 7/10  11/14: 12.72 5x STS TUG 8.37 6 MWT 866 1 rest break RPE 5/10 O2 95% HR85  MINI-BESTest: Balance Evaluation Systems Test ANTICIPATORY: SIT TO STAND: (2) normal without use of hands and stabilizes independently     (1) Moderate comes to stand WITH use of hands on 1st attempt    (0) Severe: unable to stand up from chair without assistance OR needs several attempts with use of   hands Score:2  2. RISE TO TOES: feet shoulder width apart. Hands on hips.  Rise as high as you can onto your toes. Try to hold this pose for 3 sec                          (2) stable for 3s with maximum height    (1) heels up, but not full range (smaller than when holding hands) OR noticeable instability for 3 sec                           (0) < or equat to 3 s  Score: 1  3. STAND ON ONE LEG: look straight ahead. Hands on hips. Lift your leg off the ground.     Left:  trial 1 __2__ trial 2__2__                                    Right: Trial 1:___2__   Trial 2:___2____   (2) 20 s       (2)  20 s                (1) < 20      (1) < 20 s   (0) Unable      (0) unable Score (use the worst side): 1  REACTIVE POSTURAL CONTROL 4. COMPENSATORY STEPPING CORRECTION FORWARD stand with feet shoulder width apart, arms at your sides. Lean forward against my hands beyond your forward limits.  When I let go, do whatever is necessary, including taking a step, to avoid a fall   (2) recovers independently  with a single, large step, to avoid a fall   (1) more than one step used to recover equilibrium   (0) No step OR would fall if not caught Score: 1  5. COMPENSATORY STEPPING  CORRECTION BACKWARD: Lean backward against my hands beyond your backward limits.  When I let go, do whatever is necessary, including a step to avoid a fall. (2) recovers independently  with a single, large step, to avoid a fall   (1) more than one step used to recover equilibrium   (0) No step OR would fall if not caught Score: 1  6. COMPENSATORY STEPPING LATERAL: Lean into my hand beyond your sideways limit.  When I let go, do whatever  is necessary, including taking a step, to avoid a fall. Left           Right   (2) recovers independently with 1 step (crossover or lateral OK)   (1) several steps to recover equilibrium   (0) falls or cannot step Score: (use the side with the lowest score)  0  SENSORY ORIENTATION 7. STANCE FEET TOGETHER EYES OPEN  firm surface Be as stable and still as possible until I say stop   (2) 30s   (1) < 30 s   (0) unable Score: 2  8. STANCE ON FOAM EYES CLOSED feet together, eyes closed, hands on hips. Be as stable and still as you can until I stay stop.  I will start timing when you close your eyes. Time in seconds:      (2) 30s   (1) <30 s   (0) unable Score: 1  9. INCLINE EYES CLOSED: Stand on incline with feet shoulder width apart, arms down at your sides.  I will start timing when you close your eyes   (2) Stands 30 s and aligns with gravity   (1) stands < 30 s OR aligns with surface   (0) unable Score: 2  DYNAMIC GAIT 10. CHANGE IN GAIT SPEED: begin walking at your normal speed, when I tell you "fast", walk as fast as you can, when I say "slow" walk very slowly   (2) significantly changes walking speed without imbalance   (1) unable to change walking speed or signs of imbalance   (0) unable to achieve significant change in walking speed AND signes of imbalance Score: 2  11. WALK WITH HEAD TURNS HORIZONTAL: begin walking at normal speed, when I say right, turn your head to the right, when I say left turn your head to the left.  Try to keep  yourself walking in a straight line   (2) performs head turns with no change in gait speed and good balance   (1) performs head turns with reduction in gait speed   (0)  performs head turns with imbalance Score: 2  12. WALK WITH PIVOT TURNS: begin walking at your normal speed.  When I tell you to "turn and stop", turn as quickly as you can, face the opposite direction and stop.  After the turn, your feet should be close together   (2) turns with feet close FAST in 3 steps with good balance   (1) turns with feet close SLOW > 4 steps with good balance   (0) cannot turn with feet close at any speed without imbalance Score: 2  13. STEP OVER OBSTACLES:  ( 9 inch height 10 feet away from subject) begin walking at your normal speed.  When you get to the box, step over it, not around it and keep walking   (2) Able to step over box with minimal change of gait speed and good balance   (1) Steps over box but touches box OR displays cautious behavior by slowing gait   (0) Unable to step over box OR steps around box Score: 1  14. TIMED UP AND GO NORMAL AND WITH DUAL TASK: 3 METER WALK: When I say go walk at your your normal speed across the tape, turn around and come back to sit in the chair   Time: Count backwards by 3s starting at    20.  When I say go, stand up from the chair, walk at your normal speed across the tape on the  floor, turn around, come back to sit in the chair.  Continue counting backwards the entire time.  Time:   (2) No noticeable change in sitting, standing or walking while backward counting when compared to TUG without dual task   (1) Dual task affects either counting OR walking (>10%)    (0) stops counting while walking OR stops walking while counting If gait speed slows more than 10% between TUG without and with dual task, the score should be decreased by a point Score:  1  Total score:         19  /28       TODAY'S TREATMENT:       DATE:   08/24/23 NuStep L 3 x 5 min  (blue machine)-PT present to discuss progress 2nd step hip flexor stretch 10x right/left Heel raises x10 Step taps 10x 5x step ups 6 inch right/left Standing blue band rows 10x Seated 5# chops 10x2 Chair sit ups 5# press overhead 10x Standing lat bar 30# 15x 2 sets (increase weight to 35# next time) Leg press: reclined back (worked well) seat 9 90#  bil 15x2; 40# single leg right/left  15x 2 each Resisted cable walk 15# 10x walk 2 min "I'm whooped" 08/19/23 NuStep L 3 x 5 min (blue machine)-PT present to discuss progress 5x STS TUG 6 MWT Mini best test PSFS Review of progress toward goals 08/17/23 2nd step hip flexor stretch 10x right/left Heel raises x10 Step taps 10x Seated 4# plyo ball chops 15x Standing lat bar 30# 15x 2 sets Leg press: seat 9 80#  bil 10x; 40# single leg right/left  next time recline seat back more Resisted cable walk 15# 10x Discussion of continued gym program after PT ([facility]?) NuStep L 3 x 5 min (blue machine)-PT present to discuss progress 317 feet in 2 min 92% HR 85 RPE 8/10  2 min recovered to 94%   08/11/23 2nd step hip flexor stretch 10x right/left Seated hamstring stretch 10" hold x10 Heel raises 2x10 Resisted cable walk 15# 10x Sit to stands plus 1 cushion (elevated seat height secondary to knee pain): holding 5# KB at chest level 2x1030# standing lat bar 10x 2 sets Standing diagonals with 5# kettlebell: hip to shoulder x10 bil each Hurdles at counter top: step-to forward and lateral with min UE support with Ues.   5# kettle bell (Farmer's Carry) walk down hallway between ortho and cancer gym, around cancer gym and back- 1 lap each arm NuStep L 3 x 5 min (blue machine)-PT present to discuss progress 08/09/23 2nd step hip flexor stretch 10x right/left Seated hamstring stretch 10" hold x10 Heel raises 2x10 Resisted cable walk 15# 10x Sit to stands plus 1 cushion (elevated seat height secondary to knee pain): holding 15# KB at chest level  3x5- fatigued by this weight and could only do sets of 5.  30# standing lat bar 10x 2 sets Holding 5# KB (farmer's carry) with 6 inch step-up 10x on each side  5# kettle bell (Farmer[employer]) walk down hallway between ortho and cancer gym, around cancer gym and back- 1 lap each arm NuStep L 3 x 5 min (blue machine)-PT present to discuss progress RPE 7-8/10 at end of session  PATIENT EDUCATION: Education details: educated on slow long breaths in through nose and long slow breaths out through pursed lips to avoid hyperventilation.  Person educated: Patient Education method: Explanation Education comprehension: verbalized understanding  HOME EXERCISE PROGRAM: To be started  GOALS: Goals reviewed with  patient? Yes  SHORT TERM GOALS: Target date: 05/27/2023   The patient will demonstrate knowledge of basic self care strategies and exercises to promote strength and mobility  Baseline: Goal status: met 9/19  2.  Able to walk 2 min 375 feet with RPE 5/10 Baseline:  Goal status: partially met RPE 7/10 today but much improved distance  3.  The patient will have an improved BERG balance score to   46 /56 indicating reduced risk of falls Baseline:  Goal status: met 9/19  4.  Improved LE strength and balance  indicated by improved 5x STS to 16 sec Baseline:  Goal status: met 9/19  5.  Set DGI for MiniBESTest goal Baseline:  Goal status: met 9/19    LONG TERM GOALS: Target date: 10/14/2023   The patient will be independent in a safe self progression of a home exercise program/gym program to promote further recovery of function  Baseline:  Goal status: MET  2.  The patient will have improved gait stamina and speed needed to ambulate 600 feet in 6 minutes with RPE of 6/10 Baseline:  Goal status: =met 11/14  3.  The patient will have an improved BERG balance score to  48  /56 indicating reduced risk of falls  Baseline:  Goal status: met 9/16  4.  The patient will have improved  Timed Up and Go (TUG) time to    <12   sec indicating improved gait speed and LE strength  Baseline:  Goal status: met 9/19  5.  The patient will have improved hip and knee strength to at least 4+/5 needed for standing >10 min for washing dishes Baseline:  Goal status: ongoing  6. Improved balance with DGI to 21/24 indicating decreased risk of falls  7. Crowds at [facility] and walk to car/parking lot with PSFS to 5 Met 11/14 8. Standing for 3 minutes for parties for socializing with PSFS of 6 Met 11/14  9. Mini best test improved to 23/28 indicating improved balance and dec risk of falls  10.  The patient will have improved gait stamina and speed needed to ambulate 950 feet in 6 minutes     ASSESSMENT:  CLINICAL IMPRESSION: Patient able to progress weights/resistance with several exercises today without difficulty or exacerbation of  knee or hip pain.  He does report significant fatigue end of session but appropriate for volume and intensity of exercise.  Therapist monitoring response throughout session and encouraging rest breaks or seated ex to help with recovery during session.  OBJECTIVE IMPAIRMENTS: cardiopulmonary status limiting activity, decreased activity tolerance, decreased balance, decreased endurance, decreased mobility, difficulty walking, decreased strength, impaired perceived functional ability, and pain.   ACTIVITY LIMITATIONS: carrying, lifting, bending, standing, squatting, and locomotion level  PARTICIPATION LIMITATIONS: meal prep, cleaning, interpersonal relationship, shopping, and community activity  PERSONAL FACTORS: Fitness, Time since onset of injury/illness/exacerbation, and 3+ comorbidities: multiple myeloma, HTN, DDD lumbar spine  are also affecting patient's functional outcome.   REHAB POTENTIAL: Good  CLINICAL DECISION MAKING: Evolving/moderate complexity  EVALUATION COMPLEXITY: Moderate  PLAN:  PT FREQUENCY: 2x/week  PT DURATION: 8  weeks  PLANNED INTERVENTIONS: Therapeutic exercises, Therapeutic activity, Neuromuscular re-education, Balance training, Gait training, Patient/Family education, Self Care, Stair training, Aquatic Therapy, Cryotherapy, Moist heat, Manual therapy, and Re-evaluation  PLAN FOR NEXT SESSION: KX;  leg press recline seat:  dual task walking, reactionary balance, eyes closed and foam balance; give [employer] program;  resisted backward walk; general strengthening; gradually increase time on his feet;  step over obstacles for toe clearance/balance;  going to [place] at Thanksgiving.    Lavinia Sharps, PT 08/24/23 10:12 AM Phone: [phone] Fax: [phone]

## 2023-08-26 ENCOUNTER — Ambulatory Visit: Payer: Medicare Other | Admitting: Physical Therapy

## 2023-08-26 DIAGNOSIS — R262 Difficulty in walking, not elsewhere classified: Secondary | ICD-10-CM

## 2023-08-26 DIAGNOSIS — R2689 Other abnormalities of gait and mobility: Secondary | ICD-10-CM | POA: Diagnosis not present

## 2023-08-26 DIAGNOSIS — M6281 Muscle weakness (generalized): Secondary | ICD-10-CM

## 2023-08-26 DIAGNOSIS — R293 Abnormal posture: Secondary | ICD-10-CM | POA: Diagnosis not present

## 2023-08-26 NOTE — Therapy (Signed)
OUTPATIENT PHYSICAL THERAPY TREATMENT      Patient Name: Jesse Mcclure. MRN: [id number] DOB:Jun 10, 1951, 72 y.o., adult Today's Date: 08/26/2023   PCP: Sharlot Gowda MD REFERRING PROVIDER: Artis Delay MD  END OF SESSION:  PT End of Session - 08/26/23 0934     Visit Number 18    Date for PT Re-Evaluation 10/14/23    Authorization Type Medicare    Progress Note Due on Visit 20    PT Start Time 0931    PT Stop Time 1012    PT Time Calculation (min) 41 min    Activity Tolerance Patient tolerated treatment well                 Past Medical History:  Diagnosis Date   BPH (benign prostatic hypertrophy)    Degenerative disc disease    Diverticulosis    Hemochromatosis    HTN (hypertension)    Hyperlipidemia    Multiple myeloma (HCC)    Polio    Status post Nissen fundoplication (without gastrostomy tube) procedure    for hiatal hernia.   Past Surgical History:  Procedure Laterality Date   FOOT SURGERY     HIATAL HERNIA REPAIR     OTHER SURGICAL HISTORY     Cataract Surgery--Both eyes   SKIN BIOPSY Right 04/13/2022   squamous cell carcinoma in stiu arising in actinic keratosis   TOTAL KNEE ARTHROPLASTY Left 07/15/2021   Procedure: TOTAL KNEE ARTHROPLASTY;  Surgeon: Teryl Lucy, MD;  Location: [facility];  Service: Orthopedics;  Laterality: Left;   Undescended testes Right 10/05/1966   Patient Active Problem List   Diagnosis Date Noted   Snoring 06/08/2023   Chronic midline low back pain without sciatica 06/01/2023   Idiopathic gout 06/01/2023   (HFpEF) heart failure with preserved ejection fraction (HCC) 05/17/2023   Shortness of breath 05/16/2023   Essential tremor 05/11/2023   Balance disorder 04/23/2023   Squamous cell carcinoma in situ 04/14/2023   Melanoma in situ (HCC) 04/14/2023   History of pulmonary embolus (PE) 09/04/2022   History of basal cell cancer 05/01/2022   First degree AV block 04/22/2022   History of skin cancer 04/21/2022    S/P TKR (total knee replacement), left 07/15/2021   Osteoarthritis of left knee 05/22/2021   Deficiency anemia 01/07/2021   ACE-inhibitor cough 12/16/2020   Tremor of both hands 10/11/2019   Peripheral neuropathy due to chemotherapy (HCC) 10/11/2019   Dry skin 10/11/2019   Insomnia disorder 09/06/2019   Loose stools 09/06/2019   S/P bone marrow transplant (HCC) 08/30/2019   Hypocalcemia 06/14/2019   Multiple myeloma without remission (HCC) 04/04/2019   Other constipation 04/04/2019   Glucose intolerance (impaired glucose tolerance) 05/20/2016   Vitamin D deficiency 05/20/2016   Chronic musculoskeletal pain 01/09/2016   History of orchiectomy, unilateral 05/20/2015   Obesity (BMI 30-39.9) 05/20/2015   Elevated PSA 05/20/2015   Arthritis 05/20/2015   Essential hypertension 03/02/2012   Hyperlipidemia 03/02/2012   Hemochromatosis, hereditary (HCC) 11/28/2008    ONSET DATE: > 1 year  REFERRING DIAG: G25.2 intention tremor; R26.89 balance disorder  THERAPY DIAG: difficulty in walking; fall risk Muscle weakness (generalized)  Difficulty in walking, not elsewhere classified  Balance disorder  Rationale for Evaluation and Treatment: Rehabilitation  SUBJECTIVE:  SUBJECTIVE STATEMENT: My back is hurting me. It could be the chemo pill.  8/10 today.  I had to sleep in the chair with the ottoman. I see the oncologist tomorrow.  The beach house has lots of steps.    Eval: Being treated for cancer 4-5 years including oral chemo at home;  Dr wants me to walk and I use poles b/c I'm very wobbly.  Dr. says it's not the medication causing this.  Went on cruise in [place] a few months ago but I couldn't tour and stayed on the boat. No falls but several close calls: 3-4 steps garage steps into the house,  stumbled going up.  If I sit a while then get up and take a few steps, will feel wobbly.  Hold onto something to pick up something from the floor. Denies loss of sensation in feet. I sleep a lot.  I'm very sedentary.   My partner thinks I need to move more.  I ride the stationary bike Tuesday/Thursday occasionally.  I used to have a trainer prior to getting sick. I get wheelchair service at airports. [place] trip in [place]  Considering [facility]  Goes by Jesse Mcclure  PERTINENT HISTORY:  Oncology History  Multiple myeloma without remission (HCC)  03/20/2019 Imaging    1. Tumor involving the L5 and S1 and S2 segments of the spine as described with mass effects upon the left L4, L5, S1 and more distal left sacral nerves   Left TKR HTN Back pain  PAIN:  Are you having pain? Yes  NPRS scale:  8/10 Pain location: LBP   Aggravating factors: standing in the kitchen Relieving factors: ibuprofen, oxy if it gets bad   PRECAUTIONS: Fall    WEIGHT BEARING RESTRICTIONS: No  FALLS: Has patient fallen in last 6 months? No  LIVING ENVIRONMENT: Lives with: partner Lives in: House/apartment 2 story main living downstairs Stairs: Yes: Internal: 10 steps; on right going up and External: 3 steps; on left going up Has following equipment at home: walking poles   PLOF: partner does cooking, pt does some grilling Enjoys being with friends, hanging out; doesn't go to social events  b/c I can't stand for long time 10 minutes  PATIENT GOALS: I want to walk better, stand longer; going to [place] in May for 11 days; ride bicycles there;  I'd like to not have to use wheelchair service at the airport  The Patient-Specific Functional Scale  Initial:  I am going to ask you to identify up to 3 important activities that you are unable to do or are having difficulty with as a result of this problem.  Today are there any activities that you are unable to do or having difficulty with because of this?  (Patient shown  scale and patient rated each activity)  Follow up: When you first came in you had difficulty performing these activities.  Today do you still have difficulty?  Patient-Specific activity scoring scheme (Point to one number):  0 1 2 3 4 5 6 7 8 9  10 Unable  Able to perform To perform                                                                                                    activity at the same Activity         Level as before                                                                                                                       Injury or problem  Activity     Standing for parties                                    Initial:       4               11/14:   8/10 much improved! 2.        Going to [employer] show                              Initial:    3                  11/14:  8/10 improved!    OBJECTIVE:    COGNITION: Overall cognitive status: Within functional limits for tasks assessed    LOWER EXTREMITY ROM:   grossly WFLs  LOWER EXTREMITY MMT:    MMT Right Eval Right  07/20/23 Left Eval Left  07/20/23  Hip flexion  4  4  Hip extension      Hip abduction 4 4+ 4 4+  Hip adduction      Hip internal rotation      Hip external rotation      Knee flexion      Knee extension 4 4 4 4   Ankle dorsiflexion 4 4 4 4   Ankle plantarflexion 4 4 4 4   Ankle inversion      Ankle eversion      (Blank rows = not tested)           TRUNK STRENGTH:  Decreased activation of transverse abdominus muscles; abdominals 4-/5; decreased activation of lumbar multifidi; trunk extensors 4-/5 07/20/23:  Improved to 4/5                                  GAIT: Comments: wider base of support; decreased step length; decreased dorsiflexion  FUNCTIONAL TESTS:  5x  sit to stand: 20.31 no hands TUG 12.11 sec no hands 2 min walk test 366 feet RPE 7/10 BERG balance test:  43/56  indicating a 50% risk of falls; most challenged by single limb standing or narrow base of support; decreased speed of movement, limited forward reach  9/19: 5 sit to stand 12.33 no hands TUG 11.61 no hands BERG: 49/56 Dynamic Gait Index: 18/24 difficulty with head turns and head extension  10/15: 5 sit to stand 12.98 no hands TUG 7.53 sec no hands BERG: 49/56 2 min walk test: 420.7 feet with RPE of 7/10  11/14: 12.72 5x STS TUG 8.37 6 MWT 866 1 rest break RPE 5/10 O2 95% HR85  MINI-BESTest: Balance Evaluation Systems Test ANTICIPATORY: SIT TO STAND: (2) normal without use of hands and stabilizes independently     (1) Moderate comes to stand WITH use of hands on 1st attempt    (0) Severe: unable to stand up from chair without assistance OR needs several attempts with use of   hands Score:2  2. RISE TO TOES: feet shoulder width apart. Hands on hips.  Rise as high as you can onto your toes. Try to hold this pose for 3 sec                          (2) Mcclure for 3s with maximum height    (1) heels up, but not full range (smaller than when holding hands) OR noticeable instability for 3 sec                           (0) < or equat to 3 s  Score: 1  3. STAND ON ONE LEG: look straight ahead. Hands on hips. Lift your leg off the ground.     Left:  trial 1 __2__ trial 2__2__                                    Right: Trial 1:___2__   Trial 2:___2____   (2) 20 s       (2)  20 s                (1) < 20      (1) < 20 s   (0) Unable      (0) unable Score (use the worst side): 1  REACTIVE POSTURAL CONTROL 4. COMPENSATORY STEPPING CORRECTION FORWARD stand with feet shoulder width apart, arms at your sides. Lean forward against my hands beyond your forward limits.  When I let go, do whatever is necessary, including taking a step, to avoid a fall   (2) recovers independently  with a single, large step, to avoid a fall   (1) more than one step used to recover equilibrium   (0) No step OR would  fall if not caught Score: 1  5. COMPENSATORY STEPPING CORRECTION BACKWARD: Lean backward against my hands beyond your backward limits.  When I let go, do whatever is necessary, including a step to avoid a fall. (2) recovers independently  with a single, large step, to avoid a fall   (1) more than one step used to recover equilibrium   (0) No step OR would fall if not caught Score: 1  6. COMPENSATORY STEPPING LATERAL: Lean into my hand beyond your sideways limit.  When I let go, do whatever  is necessary, including taking a step, to avoid a fall. Left           Right   (2) recovers independently with 1 step (crossover or lateral OK)   (1) several steps to recover equilibrium   (0) falls or cannot step Score: (use the side with the lowest score)  0  SENSORY ORIENTATION 7. STANCE FEET TOGETHER EYES OPEN  firm surface Be as Mcclure and still as possible until I say stop   (2) 30s   (1) < 30 s   (0) unable Score: 2  8. STANCE ON FOAM EYES CLOSED feet together, eyes closed, hands on hips. Be as Mcclure and still as you can until I stay stop.  I will start timing when you close your eyes. Time in seconds:      (2) 30s   (1) <30 s   (0) unable Score: 1  9. INCLINE EYES CLOSED: Stand on incline with feet shoulder width apart, arms down at your sides.  I will start timing when you close your eyes   (2) Stands 30 s and aligns with gravity   (1) stands < 30 s OR aligns with surface   (0) unable Score: 2  DYNAMIC GAIT 10. CHANGE IN GAIT SPEED: begin walking at your normal speed, when I tell you "fast", walk as fast as you can, when I say "slow" walk very slowly   (2) significantly changes walking speed without imbalance   (1) unable to change walking speed or signs of imbalance   (0) unable to achieve significant change in walking speed AND signes of imbalance Score: 2  11. WALK WITH HEAD TURNS HORIZONTAL: begin walking at normal speed, when I say right, turn your head to the right, when I  say left turn your head to the left.  Try to keep yourself walking in a straight line   (2) performs head turns with no change in gait speed and good balance   (1) performs head turns with reduction in gait speed   (0)  performs head turns with imbalance Score: 2  12. WALK WITH PIVOT TURNS: begin walking at your normal speed.  When I tell you to "turn and stop", turn as quickly as you can, face the opposite direction and stop.  After the turn, your feet should be close together   (2) turns with feet close FAST in 3 steps with good balance   (1) turns with feet close SLOW > 4 steps with good balance   (0) cannot turn with feet close at any speed without imbalance Score: 2  13. STEP OVER OBSTACLES:  ( 9 inch height 10 feet away from subject) begin walking at your normal speed.  When you get to the box, step over it, not around it and keep walking   (2) Able to step over box with minimal change of gait speed and good balance   (1) Steps over box but touches box OR displays cautious behavior by slowing gait   (0) Unable to step over box OR steps around box Score: 1  14. TIMED UP AND GO NORMAL AND WITH DUAL TASK: 3 METER WALK: When I say go walk at your your normal speed across the tape, turn around and come back to sit in the chair   Time: Count backwards by 3s starting at    20.  When I say go, stand up from the chair, walk at your normal speed across the tape on the  floor, turn around, come back to sit in the chair.  Continue counting backwards the entire time.  Time:   (2) No noticeable change in sitting, standing or walking while backward counting when compared to TUG without dual task   (1) Dual task affects either counting OR walking (>10%)    (0) stops counting while walking OR stops walking while counting If gait speed slows more than 10% between TUG without and with dual task, the score should be decreased by a point Score:  1  Total score:         19  /28       TODAY'S  TREATMENT:       DATE:   11/21: NuStep L 5 x 5 min (green machine)-PT present to discuss progress Standing lat bar 35# 15x 2 Seated lumbar stretch 3 way blue ball rolls 15x Resisted cable walk 15# 10x Chair sit ups 10# kettlebell 15x Standing cable rows 15# 15x 6 inch step taps 20x with light UE support 6 inch step up 10x right/left with light bil railing support Resisted cable walk 15# 10x walk 2 min some increase in back pain and shortness of breath/fatigue  08/24/23 NuStep L 3 x 5 min (blue machine)-PT present to discuss progress 2nd step hip flexor stretch 10x right/left Heel raises x10 Step taps 10x 5x step ups 6 inch right/left Standing blue band rows 10x Seated 5# chops 10x2 Chair sit ups 5# press overhead 10x Standing lat bar 30# 15x 2 sets (increase weight to 35# next time) Leg press: reclined back (worked well) seat 9 90#  bil 15x2; 40# single leg right/left  15x 2 each Resisted cable walk 15# 10x walk 2 min "I'm whooped" 08/19/23 NuStep L 3 x 5 min (blue machine)-PT present to discuss progress 5x STS TUG 6 MWT Mini best test PSFS Review of progress toward goals 08/17/23 2nd step hip flexor stretch 10x right/left Heel raises x10 Step taps 10x Seated 4# plyo ball chops 15x Standing lat bar 30# 15x 2 sets Leg press: seat 9 80#  bil 10x; 40# single leg right/left  next time recline seat back more Resisted cable walk 15# 10x Discussion of continued gym program after PT ([facility]?) NuStep L 3 x 5 min (blue machine)-PT present to discuss progress 317 feet in 2 min 92% HR 85 RPE 8/10  2 min recovered to 94%   08/11/23 2nd step hip flexor stretch 10x right/left Seated hamstring stretch 10" hold x10 Heel raises 2x10 Resisted cable walk 15# 10x Sit to stands plus 1 cushion (elevated seat height secondary to knee pain): holding 5# KB at chest level 2x1030# standing lat bar 10x 2 sets Standing diagonals with 5# kettlebell: hip to shoulder x10 bil each Hurdles at  counter top: step-to forward and lateral with min UE support with Ues.   5# kettle bell Engineer, manufacturing) walk down hallway between ortho and cancer gym, around cancer gym and back- 1 lap each arm NuStep L 3 x 5 min (blue machine)-PT present to discuss progress  PATIENT EDUCATION: Education details: educated on slow long breaths in through nose and long slow breaths out through pursed lips to avoid hyperventilation.  Person educated: Patient Education method: Explanation Education comprehension: verbalized understanding  HOME EXERCISE PROGRAM: To be started  GOALS: Goals reviewed with patient? Yes  SHORT TERM GOALS: Target date: 05/27/2023   The patient will demonstrate knowledge of basic self care strategies and exercises to promote strength and mobility  Baseline: Goal status: met  9/19  2.  Able to walk 2 min 375 feet with RPE 5/10 Baseline:  Goal status: partially met RPE 7/10 today but much improved distance  3.  The patient will have an improved BERG balance score to   46 /56 indicating reduced risk of falls Baseline:  Goal status: met 9/19  4.  Improved LE strength and balance  indicated by improved 5x STS to 16 sec Baseline:  Goal status: met 9/19  5.  Set DGI for MiniBESTest goal Baseline:  Goal status: met 9/19    LONG TERM GOALS: Target date: 10/14/2023   The patient will be independent in a safe self progression of a home exercise program/gym program to promote further recovery of function  Baseline:  Goal status: MET  2.  The patient will have improved gait stamina and speed needed to ambulate 600 feet in 6 minutes with RPE of 6/10 Baseline:  Goal status: =met 11/14  3.  The patient will have an improved BERG balance score to  48  /56 indicating reduced risk of falls  Baseline:  Goal status: met 9/16  4.  The patient will have improved Timed Up and Go (TUG) time to    <12   sec indicating improved gait speed and LE strength  Baseline:  Goal status:  met 9/19  5.  The patient will have improved hip and knee strength to at least 4+/5 needed for standing >10 min for washing dishes Baseline:  Goal status: ongoing  6. Improved balance with DGI to 21/24 indicating decreased risk of falls  7. Crowds at [facility] and walk to car/parking lot with PSFS to 5 Met 11/14 8. Standing for 3 minutes for parties for socializing with PSFS of 6 Met 11/14  9. Mini best test improved to 23/28 indicating improved balance and dec risk of falls  10.  The patient will have improved gait stamina and speed needed to ambulate 950 feet in 6 minutes     ASSESSMENT:  CLINICAL IMPRESSION: Alternating seated and standing ex's secondary to increased complaint of back pain today (sitting relieves back pain).  Therapist monitoring response and providing rest time as well for breath recovery.  Treatment includes LE strengthening including step ups (pt will have lots of steps at the beach house next week).  Will follow up and continue with PT when he returns from his trip.     OBJECTIVE IMPAIRMENTS: cardiopulmonary status limiting activity, decreased activity tolerance, decreased balance, decreased endurance, decreased mobility, difficulty walking, decreased strength, impaired perceived functional ability, and pain.   ACTIVITY LIMITATIONS: carrying, lifting, bending, standing, squatting, and locomotion level  PARTICIPATION LIMITATIONS: meal prep, cleaning, interpersonal relationship, shopping, and community activity  PERSONAL FACTORS: Fitness, Time since onset of injury/illness/exacerbation, and 3+ comorbidities: multiple myeloma, HTN, DDD lumbar spine  are also affecting patient's functional outcome.   REHAB POTENTIAL: Good  CLINICAL DECISION MAKING: Evolving/moderate complexity  EVALUATION COMPLEXITY: Moderate  PLAN:  PT FREQUENCY: 2x/week  PT DURATION: 8 weeks  PLANNED INTERVENTIONS: Therapeutic exercises, Therapeutic activity, Neuromuscular  re-education, Balance training, Gait training, Patient/Family education, Self Care, Stair training, Aquatic Therapy, Cryotherapy, Moist heat, Manual therapy, and Re-evaluation  PLAN FOR NEXT SESSION: KX;  leg press recline seat:  dual task walking, reactionary balance, eyes closed and foam balance; give [employer] program;  resisted backward walk; general strengthening; gradually increase time on his feet; step over obstacles for toe clearance/balance;  going to [place] at Thanksgiving.    Lavinia Sharps, PT 08/26/23 5:29  PM Phone: [phone] Fax: [phone]

## 2023-08-27 ENCOUNTER — Encounter: Payer: Self-pay | Admitting: Hematology and Oncology

## 2023-08-27 ENCOUNTER — Inpatient Hospital Stay (HOSPITAL_BASED_OUTPATIENT_CLINIC_OR_DEPARTMENT_OTHER): Payer: Medicare Other | Admitting: Hematology and Oncology

## 2023-08-27 ENCOUNTER — Inpatient Hospital Stay: Payer: Medicare Other | Attending: Hematology and Oncology

## 2023-08-27 ENCOUNTER — Other Ambulatory Visit (HOSPITAL_COMMUNITY): Payer: Self-pay

## 2023-08-27 VITALS — BP 126/90 | HR 62 | Temp 97.7°F | Resp 18 | Ht 69.0 in | Wt 235.8 lb

## 2023-08-27 DIAGNOSIS — C9000 Multiple myeloma not having achieved remission: Secondary | ICD-10-CM

## 2023-08-27 DIAGNOSIS — M279 Disease of jaws, unspecified: Secondary | ICD-10-CM | POA: Insufficient documentation

## 2023-08-27 LAB — COMPREHENSIVE METABOLIC PANEL
ALT: 21 U/L (ref 0–44)
AST: 21 U/L (ref 15–41)
Albumin: 3.8 g/dL (ref 3.5–5.0)
Alkaline Phosphatase: 54 U/L (ref 38–126)
Anion gap: 5 (ref 5–15)
BUN: 16 mg/dL (ref 8–23)
CO2: 31 mmol/L (ref 22–32)
Calcium: 9.3 mg/dL (ref 8.9–10.3)
Chloride: 105 mmol/L (ref 98–111)
Creatinine, Ser: 0.85 mg/dL (ref 0.61–1.24)
GFR, Estimated: >60 mL/min (ref >60)
Glucose, Bld: 82 mg/dL (ref 70–99)
Potassium: 3.9 mmol/L (ref 3.5–5.1)
Sodium: 141 mmol/L (ref 135–145)
Total Bilirubin: 1.2 mg/dL — ABNORMAL HIGH (ref <1.2)
Total Protein: 6.7 g/dL (ref 6.5–8.1)

## 2023-08-27 LAB — CBC WITH DIFFERENTIAL/PLATELET
Abs Immature Granulocytes: 0.01 10*3/uL (ref 0.00–0.07)
Basophils Absolute: 0.1 10*3/uL (ref 0.0–0.1)
Basophils Relative: 1 %
Eosinophils Absolute: 0.1 10*3/uL (ref 0.0–0.5)
Eosinophils Relative: 3 %
HCT: 43.8 % (ref 39.0–52.0)
Hemoglobin: 15.1 g/dL (ref 13.0–17.0)
Immature Granulocytes: 0 %
Lymphocytes Relative: 40 %
Lymphs Abs: 1.6 10*3/uL (ref 0.7–4.0)
MCH: 34.9 pg — ABNORMAL HIGH (ref 26.0–34.0)
MCHC: 34.5 g/dL (ref 30.0–36.0)
MCV: 101.2 fL — ABNORMAL HIGH (ref 80.0–100.0)
Monocytes Absolute: 1 10*3/uL (ref 0.1–1.0)
Monocytes Relative: 25 %
Neutro Abs: 1.3 10*3/uL — ABNORMAL LOW (ref 1.7–7.7)
Neutrophils Relative %: 31 %
Platelets: 216 10*3/uL (ref 150–400)
RBC: 4.33 MIL/uL (ref 4.22–5.81)
RDW: 15.8 % — ABNORMAL HIGH (ref 11.5–15.5)
WBC: 4.1 10*3/uL (ref 4.0–10.5)
nRBC: 0 % (ref 0.0–0.2)

## 2023-08-27 MED ORDER — RIVAROXABAN 10 MG PO TABS
10.0000 mg | ORAL_TABLET | Freq: Every day | ORAL | 1 refills | Status: DC
  Filled 2023-08-27 (×2): qty 90, 90d supply, fill #0
  Filled 2023-11-23 (×2): qty 90, 90d supply, fill #1

## 2023-08-27 NOTE — Assessment & Plan Note (Addendum)
He has recent abnormal evaluation by dentist He is scheduled to see orthodontist next month It is possible this could be caused by Zometa Recommend full evaluation and possible dental extraction if needed The patient is given written instruction to hold Pomalyst for a week and to hold Xarelto for 2 days prior to dental extraction

## 2023-08-27 NOTE — Progress Notes (Signed)
[facility] OFFICE PROGRESS NOTE  Patient Care Team: Ronnald Nian, MD as PCP - General (Family Medicine) Wyline Mood Alben Spittle, MD as PCP - Cardiology (Cardiology) Artis Delay, MD as Consulting Physician (Hematology and Oncology) Beatrice Lecher, PA-C as Physician Assistant (Cardiology)  ASSESSMENT & PLAN:  Multiple myeloma without remission [facility]) Reviewed recent myeloma panel which shows stable disease control He will continue antiviral treatment, calcium with vitamin D  Bone loss of mandible He has recent abnormal evaluation by dentist He is scheduled to see orthodontist next month It is possible this could be caused by Zometa Recommend full evaluation and possible dental extraction if needed The patient is given written instruction to hold Pomalyst for a week and to hold Xarelto for 2 days prior to dental extraction  No orders of the defined types were placed in this encounter.   All questions were answered. The patient knows to call the clinic with any problems, questions or concerns. The total time spent in the appointment was 25 minutes encounter with patients including review of chart and various tests results, discussions about plan of care and coordination of care plan   Artis Delay, MD 08/27/2023 3:22 PM  INTERVAL HISTORY: Please see below for problem oriented charting. he returns for chemotherapy follow-up He complained of some recent abnormal sensation of the right lower jaw and saw his dentist His dentist thinks he has some bone loss and is referred to see orthodontist Otherwise, he is doing well He continues to stay active and has lost some weight No recent infection  REVIEW OF SYSTEMS:   Constitutional: Denies fevers, chills or abnormal weight loss Eyes: Denies blurriness of vision Ears, nose, mouth, throat, and face: Denies mucositis or sore throat Respiratory: Denies cough, dyspnea or wheezes Cardiovascular: Denies palpitation, chest discomfort or  lower extremity swelling Gastrointestinal:  Denies nausea, heartburn or change in bowel habits Skin: Denies abnormal skin rashes Lymphatics: Denies new lymphadenopathy or easy bruising Neurological:Denies numbness, tingling or new weaknesses Behavioral/Psych: Mood is stable, no new changes  All other systems were reviewed with the patient and are negative.  I have reviewed the past medical history, past surgical history, social history and family history with the patient and they are unchanged from previous note.  ALLERGIES:  has No Known Allergies.  MEDICATIONS:  Current Outpatient Medications  Medication Sig Dispense Refill   acyclovir (ZOVIRAX) 400 MG tablet TAKE 1 TABLET(400 MG) BY MOUTH DAILY 90 tablet 1   atorvastatin (LIPITOR) 20 MG tablet TAKE 1 TABLET(20 MG) BY MOUTH DAILY 90 tablet 1   calcium carbonate (TUMS - DOSED IN MG ELEMENTAL CALCIUM) 500 MG chewable tablet Chew 1 tablet by mouth 2 (two) times daily.     Cholecalciferol (VITAMIN D) 50 MCG (2000 UT) tablet Take 2,000 Units by mouth daily.     diclofenac Sodium (VOLTAREN) 1 % GEL Apply 2 g topically 4 (four) times daily. Apply to knee 50 g 2   empagliflozin (JARDIANCE) 10 MG TABS tablet Take 1 tablet (10 mg total) by mouth daily. 30 tablet 3   folic acid (FOLVITE) 400 MCG tablet Take 400 mcg by mouth daily.     furosemide (LASIX) 40 MG tablet Take 1 tablet (40 mg total) by mouth 2 (two) times daily. 60 tablet 3   ixazomib citrate (NINLARO) 3 MG capsule Take 1 capsule (3 mg) by mouth weekly, 3 weeks on, 1 week off, repeat every 4 weeks. Take on an empty stomach 1hr before or 2hr after  meals. 3 capsule 11   losartan (COZAAR) 100 MG tablet Take 1 tablet (100 mg total) by mouth daily. 30 tablet 11   Multiple Vitamin (MULTI-VITAMIN) tablet Take 1 tablet by mouth daily.     oxyCODONE (OXY IR/ROXICODONE) 5 MG immediate release tablet Take 1 tablet (5 mg total) by mouth every 4 (four) hours as needed for severe pain (pain score  7-10). 60 tablet 0   Polyethyl Glycol-Propyl Glycol (SYSTANE OP) Place 1 drop into both eyes daily as needed (dry eyes).     pomalidomide (POMALYST) 3 MG capsule Take 1 capsule (3mg ) by mouth daily for 21 days, rest 7 days, repeat every 28 days. 21 capsule 0   primidone (MYSOLINE) 50 MG tablet TAKE 1 TABLET(50 MG) BY MOUTH AT BEDTIME 30 tablet 2   prochlorperazine (COMPAZINE) 10 MG tablet TAKE 1 TABLET(10 MG) BY MOUTH EVERY 6 HOURS AS NEEDED FOR NAUSEA OR VOMITING 60 tablet 1   rivaroxaban (XARELTO) 10 MG TABS tablet Take 1 tablet (10 mg total) by mouth daily. 90 tablet 1   vitamin B-12 (CYANOCOBALAMIN) 1000 MCG tablet Take 1,000 mcg by mouth daily.     No current facility-administered medications for this visit.    SUMMARY OF ONCOLOGIC HISTORY: Oncology History  Multiple myeloma without remission (HCC)  03/20/2019 Imaging   1. Tumor involving the L5 and S1 and S2 segments of the spine as described with mass effects upon the left L4, L5, S1 and more distal left sacral nerves as described above. 2. Does the patient have a history of malignancy? 3. Multiple plasmacytomas and metastatic disease could give this appearance   03/27/2019 Pathology Results   Soft Tissue Needle Core Biopsy, sacral mass - PLASMABLASTIC NEOPLASM. - SEE COMMENT. Microscopic Comment The sections show needle core biopsy fragments of soft tissue densely infiltrated by a relatively monomorphic infiltrate of atypical plasmacytoid cells characterized by vesicular or partially clumped chromatin and prominent nucleoli. This is associated with scattered mitosis. A battery of immunohistochemical stains was performed and show that the atypical plasmacytoid cells are positive for CD138, CD43 and cytoplasmic kappa. There is also weak positivity for CD56 and partial variable positivity for cyclin D1. The atypical plasmacytoid cells are negative for cytoplasmic lambda, CD10, PAX5, CD79a, CD20, CD3, CD5, CD34, EBV (ISH) and mostly  negative for LCA. The overall morphologic and histologic features are most compatible with a plasmablastic neoplasm. The differential diagnosis includes plasmablastic lymphoma and plasmablastic plasmacytoma/myeloma. Based on the overall phenotypic features and the clinical setting, plasmacytoma/myeloma is favored. Clinical correlation and hematologic evaluation is recommended   03/27/2019 Procedure   Technically successful CT-guided biopsy of sacral soft tissue mass.   04/04/2019 Initial Diagnosis   Multiple myeloma without remission (HCC)   04/11/2019 PET scan   IMPRESSION: 1. Previously noted expansile lesions involving the L5 vertebra and sacrum are again noted and exhibit intense FDG uptake compatible with metabolically active disease. A third focus of increased uptake without corresponding CT abnormality is noted within the intertrochanteric portions of the proximal right femur. 2. Small lucent lesion within the T3 vertebra is noted without corresponding FDG uptake. 3. Asymmetric left bladder wall thickening, etiology indeterminate. 4. Aortic Atherosclerosis (ICD10-I70.0). Lad coronary artery calcification.   04/12/2019 Cancer Staging   Staging form: Plasma Cell Myeloma and Plasma Cell Disorders, AJCC 8th Edition - Clinical stage from 04/12/2019: Beta-2-microglobulin (mg/L): 2, Albumin (g/dL): 3.6, ISS: Stage I, High-risk cytogenetics: Unknown, LDH: Unknown - Signed by Artis Delay, MD on 04/12/2019   04/14/2019 Bone Marrow  Biopsy   Bone Marrow, Aspirate,Biopsy, and Clot, right iliac bone - PLASMA CELL MYELOMA.   04/17/2019 - 07/11/2019 Chemotherapy   The patient had bortezemib, revlimid and dexamethasone for chemotherapy treatment.     08/17/2019 Bone Marrow Transplant   He received high dose melphalan followed by autologous stem cell transplant at [facility]   11/22/2019 Bone Marrow Biopsy   BONE MARROW biopsy at [facility]:       Mild monoclonal plasmacytosis (approximately 2%), kappa  light chain restricted.    11/22/2019 PET scan   PET CT at [facility] 1.  Similar size and appearance of mildly hypermetabolic lytic lesion in the L5 vertebral body and posterior elements.  2.  Otherwise, no substantial FDG uptake compared to background bone marrow in the additional osseous lucent lesions.    12/20/2019 - 05/09/2020 Radiation Therapy   Radiation Treatment Dates: 12/20/2019 through 01/08/2020 Site Technique Total Dose (Gy) Dose per Fx (Gy) Completed Fx Beam Energies  Lumbar Spine: Spine 3D 35/35 2.5 14/14 15X        02/29/2020 PET scan   1. Similar size and appearance of expansile lytic lesion in the L5 vertebral body and posterior elements with minimal FDG activity.  2. Otherwise, no additional osseous lucent lesions with FDG uptake above background bone marrow   05/31/2020 -  Chemotherapy   The patient had Revlimid for chemotherapy treatment.     11/25/2021 - 05/11/2022 Chemotherapy   Patient is on Treatment Plan : MYELOMA RELAPSED / REFRACTORY Daratumumab SQ + Bortezomib + Dexamethasone (DaraVd) q21d / Daratumumab SQ q28d      11/25/2021 - 10/29/2022 Chemotherapy   Patient is on Treatment Plan : MYELOMA RELAPSED / REFRACTORY Daratumumab SQ + Bortezomib + Dexamethasone (DaraVd) q21d / Daratumumab SQ q28d        PHYSICAL EXAMINATION: ECOG PERFORMANCE STATUS: 0 - Asymptomatic  Vitals:   08/27/23 1129  BP: (!) 126/90  Pulse: 62  Resp: 18  Temp: 97.7 F (36.5 C)  SpO2: 95%   Filed Weights   08/27/23 1129  Weight: 235 lb 12.8 oz (107 kg)    GENERAL:alert, no distress and comfortable  LABORATORY DATA:  I have reviewed the data as listed    Component Value Date/Time   NA 141 08/27/2023 1110   NA 139 06/01/2023 1115   NA 139 04/05/2014 0849   K 3.9 08/27/2023 1110   K 4.4 04/05/2014 0849   CL 105 08/27/2023 1110   CO2 31 08/27/2023 1110   CO2 23 04/05/2014 0849   GLUCOSE 82 08/27/2023 1110   GLUCOSE 109 04/05/2014 0849   BUN 16 08/27/2023 1110   BUN 26  06/01/2023 1115   BUN 16.8 04/05/2014 0849   CREATININE 0.85 08/27/2023 1110   CREATININE 0.80 10/29/2022 1046   CREATININE 0.91 07/29/2017 1004   CREATININE 0.9 04/05/2014 0849   CALCIUM 9.3 08/27/2023 1110   CALCIUM 8.7 04/05/2014 0849   PROT 6.7 08/27/2023 1110   PROT 6.3 06/01/2023 1115   PROT 6.7 04/05/2014 0849   ALBUMIN 3.8 08/27/2023 1110   ALBUMIN 4.1 06/01/2023 1115   ALBUMIN 3.8 04/05/2014 0849   AST 21 08/27/2023 1110   AST 24 10/29/2022 1046   AST 26 04/05/2014 0849   ALT 21 08/27/2023 1110   ALT 31 10/29/2022 1046   ALT 30 04/05/2014 0849   ALKPHOS 54 08/27/2023 1110   ALKPHOS 75 04/05/2014 0849   BILITOT 1.2 (H) 08/27/2023 1110   BILITOT 0.9 06/01/2023 1115   BILITOT  0.9 10/29/2022 1046   BILITOT 0.71 04/05/2014 0849   GFRNONAA >60 08/27/2023 1110   GFRNONAA >60 10/29/2022 1046   GFRAA >60 06/14/2020 0830    No results found for: "SPEP", "UPEP"  Lab Results  Component Value Date   WBC 4.1 08/27/2023   NEUTROABS 1.3 (L) 08/27/2023   HGB 15.1 08/27/2023   HCT 43.8 08/27/2023   MCV 101.2 (H) 08/27/2023   PLT 216 08/27/2023      Chemistry      Component Value Date/Time   NA 141 08/27/2023 1110   NA 139 06/01/2023 1115   NA 139 04/05/2014 0849   K 3.9 08/27/2023 1110   K 4.4 04/05/2014 0849   CL 105 08/27/2023 1110   CO2 31 08/27/2023 1110   CO2 23 04/05/2014 0849   BUN 16 08/27/2023 1110   BUN 26 06/01/2023 1115   BUN 16.8 04/05/2014 0849   CREATININE 0.85 08/27/2023 1110   CREATININE 0.80 10/29/2022 1046   CREATININE 0.91 07/29/2017 1004   CREATININE 0.9 04/05/2014 0849   GLU 10 04/10/2014 0000      Component Value Date/Time   CALCIUM 9.3 08/27/2023 1110   CALCIUM 8.7 04/05/2014 0849   ALKPHOS 54 08/27/2023 1110   ALKPHOS 75 04/05/2014 0849   AST 21 08/27/2023 1110   AST 24 10/29/2022 1046   AST 26 04/05/2014 0849   ALT 21 08/27/2023 1110   ALT 31 10/29/2022 1046   ALT 30 04/05/2014 0849   BILITOT 1.2 (H) 08/27/2023 1110   BILITOT  0.9 06/01/2023 1115   BILITOT 0.9 10/29/2022 1046   BILITOT 0.71 04/05/2014 0849

## 2023-08-27 NOTE — Assessment & Plan Note (Signed)
Reviewed recent myeloma panel which shows stable disease control He will continue antiviral treatment, calcium with vitamin D

## 2023-08-29 LAB — KAPPA/LAMBDA LIGHT CHAINS
Kappa free light chain: 33.4 mg/L — ABNORMAL HIGH (ref 3.3–19.4)
Kappa, lambda light chain ratio: 1.73 — ABNORMAL HIGH (ref 0.26–1.65)
Lambda free light chains: 19.3 mg/L (ref 5.7–26.3)

## 2023-09-05 LAB — MULTIPLE MYELOMA PANEL, SERUM
Albumin SerPl Elph-Mcnc: 3.4 g/dL (ref 2.9–4.4)
Albumin/Glob SerPl: 1.4 (ref 0.7–1.7)
Alpha 1: 0.2 g/dL (ref 0.0–0.4)
Alpha2 Glob SerPl Elph-Mcnc: 0.7 g/dL (ref 0.4–1.0)
B-Globulin SerPl Elph-Mcnc: 0.7 g/dL (ref 0.7–1.3)
Gamma Glob SerPl Elph-Mcnc: 0.9 g/dL (ref 0.4–1.8)
Globulin, Total: 2.6 g/dL (ref 2.2–3.9)
IgA: 112 mg/dL (ref 61–437)
IgG (Immunoglobin G), Serum: 1081 mg/dL (ref 603–1613)
IgM (Immunoglobulin M), Srm: 44 mg/dL (ref 15–143)
M Protein SerPl Elph-Mcnc: 0.4 g/dL — ABNORMAL HIGH
Total Protein ELP: 6 g/dL (ref 6.0–8.5)

## 2023-09-07 ENCOUNTER — Telehealth: Payer: Self-pay

## 2023-09-07 ENCOUNTER — Ambulatory Visit: Payer: Medicare Other | Admitting: Physical Therapy

## 2023-09-07 ENCOUNTER — Other Ambulatory Visit: Payer: Self-pay | Admitting: Hematology and Oncology

## 2023-09-07 MED ORDER — OXYCODONE HCL 5 MG PO TABS: 5.0000 mg | ORAL_TABLET | ORAL | 0 refills | Status: DC | PRN

## 2023-09-07 NOTE — Telephone Encounter (Signed)
He called and left a message requesting oxycodone refill to [facility].

## 2023-09-07 NOTE — Progress Notes (Unsigned)
   Cardiology Office Note:    Date:  09/08/2023  ID:  Kathlyn Sacramento., DOB [birth year], MRN [id number] PCP: Ronnald Nian, MD  [facility] Providers Cardiologist:  Maisie Fus, MD Cardiology APP:  Beatrice Lecher, PA-C       Patient Profile:      (HFpEF) heart failure with preserved ejection fraction  TTE 05/17/23:  EF 60-65, no RWMA, mild LVH, NL RVSF, severe LAE, RAP 3  Hypertension  Hyperlipidemia Aortic atherosclerosis  Multiple myeloma, metastatic  Hemochromatosis  Hx of pulmonary embolism  Hx of polio           History of Present Illness:   Jesse Mcclure. is a 72 y.o. adult who returns for follow-up of CHF.  He was last seen 06/08/23 after a recent admission for acute CHF.  He is here alone.  He just returned from the beach [facility]) for Thanksgiving.  He stayed in a house with multiple steps.  He was short of breath at the top of the steps.  Overall, his breathing is much improved.  He has not had orthopnea, leg edema or syncope.  He has not had chest pain.  ROS   See HPI    Studies Reviewed:       Chart review-Labs reviewed 08/27/2023: Creatinine 0.5, potassium 3.9        Risk Assessment/Calculations:             Physical Exam:   VS:  BP 124/69   Pulse (!) 55   Ht 5\' 9"  (1.753 m)   Wt 238 lb 3.2 oz (108 kg)   SpO2 97%   BMI 35.18 kg/m    Wt Readings from Last 3 Encounters:  09/08/23 238 lb 3.2 oz (108 kg)  08/27/23 235 lb 12.8 oz (107 kg)  07/02/23 238 lb 3.2 oz (108 kg)    Constitutional:      Appearance: Healthy appearance. Not in distress.  Neck:     Vascular: No JVR. JVD normal.  Pulmonary:     Breath sounds: Normal breath sounds. No wheezing. No rales.  Cardiovascular:     Normal rate. Regular rhythm.     Murmurs: There is no murmur.  Edema:    Peripheral edema present.    Ankle: bilateral trace edema of the ankle. Abdominal:     Palpations: Abdomen is soft.        Assessment and Plan:   Assessment &  Plan Chronic heart failure with preserved ejection fraction (HCC) EF 60-65 by echocardiogram August 2024.  He is NYHA II.  Volume status stable.  Recent creatinine, potassium normal.  Continue Jardiance 10 mg daily, Lasix 40 mg twice daily.  If volume becomes an issue or he develops hypokalemia, consider starting spironolactone.  Follow-up 6 months or sooner if needed.        Dispo:  Return in about 6 months (around 03/08/2024) for Routine Follow Up w/ Dr. Carolan Clines, or Tereso Newcomer, PA-C.  Signed, Tereso Newcomer, PA-C

## 2023-09-07 NOTE — Telephone Encounter (Signed)
done

## 2023-09-07 NOTE — Telephone Encounter (Signed)
Called and told him Rx sent. He verbalized understanding.

## 2023-09-08 ENCOUNTER — Encounter: Payer: Self-pay | Admitting: Physician Assistant

## 2023-09-08 ENCOUNTER — Ambulatory Visit: Payer: Medicare Other | Attending: Physician Assistant | Admitting: Physician Assistant

## 2023-09-08 VITALS — BP 124/69 | HR 55 | Ht 69.0 in | Wt 238.2 lb

## 2023-09-08 DIAGNOSIS — I5032 Chronic diastolic (congestive) heart failure: Secondary | ICD-10-CM | POA: Insufficient documentation

## 2023-09-08 NOTE — Patient Instructions (Signed)
Medication Instructions:  Your physician recommends that you continue on your current medications as directed. Please refer to the Current Medication list given to you today.  *If you need a refill on your cardiac medications before your next appointment, please call your pharmacy*   Lab Work: None orderd  If you have labs (blood work) drawn today and your tests are completely normal, you will receive your results only by: MyChart Message (if you have MyChart) OR A paper copy in the mail If you have any lab test that is abnormal or we need to change your treatment, we will call you to review the results.   Testing/Procedures: None ordered   Follow-Up: At [facility], you and your health needs are our priority.  As part of our continuing mission to provide you with exceptional heart care, we have created designated Provider Care Teams.  These Care Teams include your primary Cardiologist (physician) and Advanced Practice Providers (APPs -  Physician Assistants and Nurse Practitioners) who all work together to provide you with the care you need, when you need it.  We recommend signing up for the patient portal called "MyChart".  Sign up information is provided on this After Visit Summary.  MyChart is used to connect with patients for Virtual Visits (Telemedicine).  Patients are able to view lab/test results, encounter notes, upcoming appointments, etc.  Non-urgent messages can be sent to your provider as well.   To learn more about what you can do with MyChart, go to [email or web address].    Your next appointment:   6 month(s)  Provider:   Maisie Fus, MD     Other Instructions

## 2023-09-08 NOTE — Assessment & Plan Note (Signed)
EF 60-65 by echocardiogram August 2024.  He is NYHA II.  Volume status stable.  Recent creatinine, potassium normal.  Continue Jardiance 10 mg daily, Lasix 40 mg twice daily.  If volume becomes an issue or he develops hypokalemia, consider starting spironolactone.  Follow-up 6 months or sooner if needed.

## 2023-09-09 ENCOUNTER — Ambulatory Visit: Payer: Medicare Other | Attending: Hematology and Oncology | Admitting: Physical Therapy

## 2023-09-09 DIAGNOSIS — R262 Difficulty in walking, not elsewhere classified: Secondary | ICD-10-CM | POA: Insufficient documentation

## 2023-09-09 DIAGNOSIS — R2689 Other abnormalities of gait and mobility: Secondary | ICD-10-CM | POA: Insufficient documentation

## 2023-09-09 DIAGNOSIS — M6281 Muscle weakness (generalized): Secondary | ICD-10-CM | POA: Diagnosis not present

## 2023-09-09 NOTE — Therapy (Signed)
OUTPATIENT PHYSICAL THERAPY TREATMENT      Patient Name: Jesse Mcclure. MRN: [id number] DOB:Feb 09, 1951, 72 y.o., adult Today's Date: 09/09/2023   PCP: Sharlot Gowda MD REFERRING PROVIDER: Artis Delay MD  END OF SESSION:  PT End of Session - 09/09/23 1449     Visit Number 19    Date for PT Re-Evaluation 10/14/23    Authorization Type Medicare    Progress Note Due on Visit 20    PT Start Time 1445    PT Stop Time 1530    PT Time Calculation (min) 45 min    Activity Tolerance Patient tolerated treatment well                 Past Medical History:  Diagnosis Date   BPH (benign prostatic hypertrophy)    Degenerative disc disease    Diverticulosis    Hemochromatosis    HTN (hypertension)    Hyperlipidemia    Multiple myeloma (HCC)    Polio    Status post Nissen fundoplication (without gastrostomy tube) procedure    for hiatal hernia.   Past Surgical History:  Procedure Laterality Date   FOOT SURGERY     HIATAL HERNIA REPAIR     OTHER SURGICAL HISTORY     Cataract Surgery--Both eyes   SKIN BIOPSY Right 04/13/2022   squamous cell carcinoma in stiu arising in actinic keratosis   TOTAL KNEE ARTHROPLASTY Left 07/15/2021   Procedure: TOTAL KNEE ARTHROPLASTY;  Surgeon: Teryl Lucy, MD;  Location: [facility];  Service: Orthopedics;  Laterality: Left;   Undescended testes Right 10/05/1966   Patient Active Problem List   Diagnosis Date Noted   Bone loss of mandible 08/27/2023   Snoring 06/08/2023   Chronic midline low back pain without sciatica 06/01/2023   Idiopathic gout 06/01/2023   (HFpEF) heart failure with preserved ejection fraction (HCC) 05/17/2023   Shortness of breath 05/16/2023   Essential tremor 05/11/2023   Balance disorder 04/23/2023   Squamous cell carcinoma in situ 04/14/2023   Melanoma in situ (HCC) 04/14/2023   History of pulmonary embolus (PE) 09/04/2022   History of basal cell cancer 05/01/2022   First degree AV block 04/22/2022    History of skin cancer 04/21/2022   S/P TKR (total knee replacement), left 07/15/2021   Osteoarthritis of left knee 05/22/2021   Deficiency anemia 01/07/2021   ACE-inhibitor cough 12/16/2020   Tremor of both hands 10/11/2019   Peripheral neuropathy due to chemotherapy (HCC) 10/11/2019   Dry skin 10/11/2019   Insomnia disorder 09/06/2019   Loose stools 09/06/2019   S/P bone marrow transplant (HCC) 08/30/2019   Hypocalcemia 06/14/2019   Multiple myeloma without remission (HCC) 04/04/2019   Other constipation 04/04/2019   Glucose intolerance (impaired glucose tolerance) 05/20/2016   Vitamin D deficiency 05/20/2016   Chronic musculoskeletal pain 01/09/2016   History of orchiectomy, unilateral 05/20/2015   Obesity (BMI 30-39.9) 05/20/2015   Elevated PSA 05/20/2015   Arthritis 05/20/2015   Essential hypertension 03/02/2012   Hyperlipidemia 03/02/2012   Hemochromatosis, hereditary (HCC) 11/28/2008    ONSET DATE: > 1 year  REFERRING DIAG: G25.2 intention tremor; R26.89 balance disorder  THERAPY DIAG: difficulty in walking; fall risk Muscle weakness (generalized)  Difficulty in walking, not elsewhere classified  Rationale for Evaluation and Treatment: Rehabilitation  SUBJECTIVE:  SUBJECTIVE STATEMENT: The steps at the beach were a lot.  I walked on the beach 1x but didn't go far.  My back has been bothering me a lot. 8/10 now, 10/10 at the beach.  Going to [employer] to celebrate with birthday.    Eval: Being treated for cancer 4-5 years including oral chemo at home;  Dr wants me to walk and I use poles b/c I'm very wobbly.  Dr. says it's not the medication causing this.  Went on cruise in [place] a few months ago but I couldn't tour and stayed on the boat. No falls but several close  calls: 3-4 steps garage steps into the house, stumbled going up.  If I sit a while then get up and take a few steps, will feel wobbly.  Hold onto something to pick up something from the floor. Denies loss of sensation in feet. I sleep a lot.  I'm very sedentary.   My partner thinks I need to move more.  I ride the stationary bike Tuesday/Thursday occasionally.  I used to have a trainer prior to getting sick. I get wheelchair service at airports. [place] trip in [place]  Considering [facility]  Goes by [name]  PERTINENT HISTORY:  Oncology History  Multiple myeloma without remission (HCC)  03/20/2019 Imaging    1. Tumor involving the L5 and S1 and S2 segments of the spine as described with mass effects upon the left L4, L5, S1 and more distal left sacral nerves   Left TKR HTN Back pain  PAIN:  Are you having pain? Yes  NPRS scale:  8/10 Pain location: LBP   Aggravating factors: standing in the kitchen Relieving factors: ibuprofen, oxy if it gets bad   PRECAUTIONS: Fall    WEIGHT BEARING RESTRICTIONS: No  FALLS: Has patient fallen in last 6 months? No  LIVING ENVIRONMENT: Lives with: partner Lives in: House/apartment 2 story main living downstairs Stairs: Yes: Internal: 10 steps; on right going up and External: 3 steps; on left going up Has following equipment at home: walking poles   PLOF: partner does cooking, pt does some grilling Enjoys being with friends, hanging out; doesn't go to social events  b/c I can't stand for long time 10 minutes  PATIENT GOALS: I want to walk better, stand longer; going to [place] in May for 11 days; ride bicycles there;  I'd like to not have to use wheelchair service at the airport  The Patient-Specific Functional Scale  Initial:  I am going to ask you to identify up to 3 important activities that you are unable to do or are having difficulty with as a result of this problem.  Today are there any activities that you are unable to do or having  difficulty with because of this?  (Patient shown scale and patient rated each activity)  Follow up: When you first came in you had difficulty performing these activities.  Today do you still have difficulty?  Patient-Specific activity scoring scheme (Point to one number):  0 1 2 3 4 5 6 7 8 9  10 Unable  Able to perform To perform                                                                                                    activity at the same Activity         Level as before                                                                                                                       Injury or problem  Activity     Standing for parties                                    Initial:       4               11/14:   8/10 much improved! 2.        Going to [employer] show                              Initial:    3                  11/14:  8/10 improved!    OBJECTIVE:    COGNITION: Overall cognitive status: Within functional limits for tasks assessed    LOWER EXTREMITY ROM:   grossly WFLs  LOWER EXTREMITY MMT:    MMT Right Eval Right  07/20/23 Left Eval Left  07/20/23  Hip flexion  4  4  Hip extension      Hip abduction 4 4+ 4 4+  Hip adduction      Hip internal rotation      Hip external rotation      Knee flexion      Knee extension 4 4 4 4   Ankle dorsiflexion 4 4 4 4   Ankle plantarflexion 4 4 4 4   Ankle inversion      Ankle eversion      (Blank rows = not tested)           TRUNK STRENGTH:  Decreased activation of transverse abdominus muscles; abdominals 4-/5; decreased activation of lumbar multifidi; trunk extensors 4-/5 07/20/23:  Improved to 4/5                                  GAIT: Comments: wider base of support; decreased step length; decreased dorsiflexion  FUNCTIONAL TESTS:  5x  sit to stand: 20.31 no hands TUG 12.11 sec no hands 2 min walk test  366 feet RPE 7/10 BERG balance test:  43/56 indicating a 50% risk of falls; most challenged by single limb standing or narrow base of support; decreased speed of movement, limited forward reach  9/19: 5 sit to stand 12.33 no hands TUG 11.61 no hands BERG: 49/56 Dynamic Gait Index: 18/24 difficulty with head turns and head extension  10/15: 5 sit to stand 12.98 no hands TUG 7.53 sec no hands BERG: 49/56 2 min walk test: 420.7 feet with RPE of 7/10  11/14: 12.72 5x STS TUG 8.37 6 MWT 866 1 rest break RPE 5/10 O2 95% HR85  MINI-BESTest: Balance Evaluation Systems Test ANTICIPATORY: SIT TO STAND: (2) normal without use of hands and stabilizes independently     (1) Moderate comes to stand WITH use of hands on 1st attempt    (0) Severe: unable to stand up from chair without assistance OR needs several attempts with use of   hands Score:2  2. RISE TO TOES: feet shoulder width apart. Hands on hips.  Rise as high as you can onto your toes. Try to hold this pose for 3 sec                          (2) stable for 3s with maximum height    (1) heels up, but not full range (smaller than when holding hands) OR noticeable instability for 3 sec                           (0) < or equat to 3 s  Score: 1  3. STAND ON ONE LEG: look straight ahead. Hands on hips. Lift your leg off the ground.     Left:  trial 1 __2__ trial 2__2__                                    Right: Trial 1:___2__   Trial 2:___2____   (2) 20 s       (2)  20 s                (1) < 20      (1) < 20 s   (0) Unable      (0) unable Score (use the worst side): 1  REACTIVE POSTURAL CONTROL 4. COMPENSATORY STEPPING CORRECTION FORWARD stand with feet shoulder width apart, arms at your sides. Lean forward against my hands beyond your forward limits.  When I let go, do whatever is necessary, including taking a step, to avoid a fall   (2) recovers independently  with a single, large step, to avoid a fall   (1) more than one step used to  recover equilibrium   (0) No step OR would fall if not caught Score: 1  5. COMPENSATORY STEPPING CORRECTION BACKWARD: Lean backward against my hands beyond your backward limits.  When I let go, do whatever is necessary, including a step to avoid a fall. (2) recovers independently  with a single, large step, to avoid a fall   (1) more than one step used to recover equilibrium   (0) No step OR would fall if not caught Score: 1  6. COMPENSATORY STEPPING LATERAL: Lean into my hand beyond your sideways limit.  When I let go, do whatever  is necessary, including taking a step, to avoid a fall. Left           Right   (2) recovers independently with 1 step (crossover or lateral OK)   (1) several steps to recover equilibrium   (0) falls or cannot step Score: (use the side with the lowest score)  0  SENSORY ORIENTATION 7. STANCE FEET TOGETHER EYES OPEN  firm surface Be as stable and still as possible until I say stop   (2) 30s   (1) < 30 s   (0) unable Score: 2  8. STANCE ON FOAM EYES CLOSED feet together, eyes closed, hands on hips. Be as stable and still as you can until I stay stop.  I will start timing when you close your eyes. Time in seconds:      (2) 30s   (1) <30 s   (0) unable Score: 1  9. INCLINE EYES CLOSED: Stand on incline with feet shoulder width apart, arms down at your sides.  I will start timing when you close your eyes   (2) Stands 30 s and aligns with gravity   (1) stands < 30 s OR aligns with surface   (0) unable Score: 2  DYNAMIC GAIT 10. CHANGE IN GAIT SPEED: begin walking at your normal speed, when I tell you "fast", walk as fast as you can, when I say "slow" walk very slowly   (2) significantly changes walking speed without imbalance   (1) unable to change walking speed or signs of imbalance   (0) unable to achieve significant change in walking speed AND signes of imbalance Score: 2  11. WALK WITH HEAD TURNS HORIZONTAL: begin walking at normal speed, when I  say right, turn your head to the right, when I say left turn your head to the left.  Try to keep yourself walking in a straight line   (2) performs head turns with no change in gait speed and good balance   (1) performs head turns with reduction in gait speed   (0)  performs head turns with imbalance Score: 2  12. WALK WITH PIVOT TURNS: begin walking at your normal speed.  When I tell you to "turn and stop", turn as quickly as you can, face the opposite direction and stop.  After the turn, your feet should be close together   (2) turns with feet close FAST in 3 steps with good balance   (1) turns with feet close SLOW > 4 steps with good balance   (0) cannot turn with feet close at any speed without imbalance Score: 2  13. STEP OVER OBSTACLES:  ( 9 inch height 10 feet away from subject) begin walking at your normal speed.  When you get to the box, step over it, not around it and keep walking   (2) Able to step over box with minimal change of gait speed and good balance   (1) Steps over box but touches box OR displays cautious behavior by slowing gait   (0) Unable to step over box OR steps around box Score: 1  14. TIMED UP AND GO NORMAL AND WITH DUAL TASK: 3 METER WALK: When I say go walk at your your normal speed across the tape, turn around and come back to sit in the chair   Time: Count backwards by 3s starting at    20.  When I say go, stand up from the chair, walk at your normal speed across the tape on the  floor, turn around, come back to sit in the chair.  Continue counting backwards the entire time.  Time:   (2) No noticeable change in sitting, standing or walking while backward counting when compared to TUG without dual task   (1) Dual task affects either counting OR walking (>10%)    (0) stops counting while walking OR stops walking while counting If gait speed slows more than 10% between TUG without and with dual task, the score should be decreased by a point Score:  1  Total  score:         19  /28       TODAY'S TREATMENT:       DATE:   12/5: NuStep L 3 x 5 min (blue machine)-PT present to discuss progress Standing lat bar 35# 10x 2 Seated lumbar stretch 3 way blue ball rolls 15x Seated red power cord trunk extension 20x Up and down steps 2 railings 4x Chair sit ups 10# kettlebell 15x 6 inch step taps holding 10# kettlebell 2 sets of 10x with light UE support walk 2 min some increase in back pain and shortness of breath/fatigue 11/21: NuStep L 5 x 5 min (green machine)-PT present to discuss progress Standing lat bar 35# 15x 2 Seated lumbar stretch 3 way blue ball rolls 15x Resisted cable walk 15# 10x Chair sit ups 10# kettlebell 15x Standing cable rows 15# 15x 6 inch step taps 20x with light UE support 6 inch step up 10x right/left with light bil railing support Resisted cable walk 15# 10x walk 2 min some increase in back pain and shortness of breath/fatigue  08/24/23 NuStep L 3 x 5 min (blue machine)-PT present to discuss progress 2nd step hip flexor stretch 10x right/left Heel raises x10 Step taps 10x 5x step ups 6 inch right/left Standing blue band rows 10x Seated 5# chops 10x2 Chair sit ups 5# press overhead 10x Standing lat bar 30# 15x 2 sets (increase weight to 35# next time) Leg press: reclined back (worked well) seat 9 90#  bil 15x2; 40# single leg right/left  15x 2 each Resisted cable walk 15# 10x walk 2 min "I'm whooped" 08/19/23 NuStep L 3 x 5 min (blue machine)-PT present to discuss progress 5x STS TUG 6 MWT Mini best test PSFS Review of progress toward goals 08/17/23 2nd step hip flexor stretch 10x right/left Heel raises x10 Step taps 10x Seated 4# plyo ball chops 15x Standing lat bar 30# 15x 2 sets Leg press: seat 9 80#  bil 10x; 40# single leg right/left  next time recline seat back more Resisted cable walk 15# 10x Discussion of continued gym program after PT ([facility]?) NuStep L 3 x 5 min (blue machine)-PT present  to discuss progress 317 feet in 2 min 92% HR 85 RPE 8/10  2 min recovered to 94%   08/11/23 2nd step hip flexor stretch 10x right/left Seated hamstring stretch 10" hold x10 Heel raises 2x10 Resisted cable walk 15# 10x Sit to stands plus 1 cushion (elevated seat height secondary to knee pain): holding 5# KB at chest level 2x1030# standing lat bar 10x 2 sets Standing diagonals with 5# kettlebell: hip to shoulder x10 bil each Hurdles at counter top: step-to forward and lateral with min UE support with [employer].   5# kettle bell Engineer, manufacturing) walk down hallway between ortho and cancer gym, around cancer gym and back- 1 lap each arm NuStep L 3 x 5 min (blue machine)-PT present to discuss progress  PATIENT EDUCATION: Education details:  educated on slow long breaths in through nose and long slow breaths out through pursed lips to avoid hyperventilation.  Person educated: Patient Education method: Explanation Education comprehension: verbalized understanding  HOME EXERCISE PROGRAM: To be started  GOALS: Goals reviewed with patient? Yes  SHORT TERM GOALS: Target date: 05/27/2023   The patient will demonstrate knowledge of basic self care strategies and exercises to promote strength and mobility  Baseline: Goal status: met 9/19  2.  Able to walk 2 min 375 feet with RPE 5/10 Baseline:  Goal status: partially met RPE 7/10 today but much improved distance  3.  The patient will have an improved BERG balance score to   46 /56 indicating reduced risk of falls Baseline:  Goal status: met 9/19  4.  Improved LE strength and balance  indicated by improved 5x STS to 16 sec Baseline:  Goal status: met 9/19  5.  Set DGI for MiniBESTest goal Baseline:  Goal status: met 9/19    LONG TERM GOALS: Target date: 10/14/2023   The patient will be independent in a safe self progression of a home exercise program/gym program to promote further recovery of function  Baseline:  Goal status: MET  2.   The patient will have improved gait stamina and speed needed to ambulate 600 feet in 6 minutes with RPE of 6/10 Baseline:  Goal status: =met 11/14  3.  The patient will have an improved BERG balance score to  48  /56 indicating reduced risk of falls  Baseline:  Goal status: met 9/16  4.  The patient will have improved Timed Up and Go (TUG) time to    <12   sec indicating improved gait speed and LE strength  Baseline:  Goal status: met 9/19  5.  The patient will have improved hip and knee strength to at least 4+/5 needed for standing >10 min for washing dishes Baseline:  Goal status: ongoing  6. Improved balance with DGI to 21/24 indicating decreased risk of falls  7. Crowds at [facility] and walk to car/parking lot with PSFS to 5 Met 11/14 8. Standing for 3 minutes for parties for socializing with PSFS of 6 Met 11/14  9. Mini best test improved to 23/28 indicating improved balance and dec risk of falls  10.  The patient will have improved gait stamina and speed needed to ambulate 950 feet in 6 minutes     ASSESSMENT:  CLINICAL IMPRESSION: The patient returns after a week long trip to the beach where he was challenged with going up and down the stairs and walking onto the beach.  He reports increased back pain as a result, severe at times.  Today he is able to tolerate alternating seated and standing ex's without an increase in back pain.  Walking distance limited both by general fatigue and back pain.  Therapist monitoring response throughout session and adjusting treatment accordingly.     OBJECTIVE IMPAIRMENTS: cardiopulmonary status limiting activity, decreased activity tolerance, decreased balance, decreased endurance, decreased mobility, difficulty walking, decreased strength, impaired perceived functional ability, and pain.   ACTIVITY LIMITATIONS: carrying, lifting, bending, standing, squatting, and locomotion level  PARTICIPATION LIMITATIONS: meal prep, cleaning,  interpersonal relationship, shopping, and community activity  PERSONAL FACTORS: Fitness, Time since onset of injury/illness/exacerbation, and 3+ comorbidities: multiple myeloma, HTN, DDD lumbar spine  are also affecting patient's functional outcome.   REHAB POTENTIAL: Good  CLINICAL DECISION MAKING: Evolving/moderate complexity  EVALUATION COMPLEXITY: Moderate  PLAN:  PT FREQUENCY: 2x/week  PT DURATION:  8 weeks  PLANNED INTERVENTIONS: Therapeutic exercises, Therapeutic activity, Neuromuscular re-education, Balance training, Gait training, Patient/Family education, Self Care, Stair training, Aquatic Therapy, Cryotherapy, Moist heat, Manual therapy, and Re-evaluation  PLAN FOR NEXT SESSION: 20th visit progress note;  KX;  leg press recline seat:  dual task walking, reactionary balance, eyes closed and foam balance; give [employer] program;  resisted backward walk; general strengthening; gradually increase time on his feet; step over obstacles for toe clearance/balance Lavinia Sharps, PT 09/09/23 5:22 PM Phone: [phone] Fax: [phone]

## 2023-09-13 ENCOUNTER — Other Ambulatory Visit: Payer: Self-pay

## 2023-09-13 DIAGNOSIS — C9000 Multiple myeloma not having achieved remission: Secondary | ICD-10-CM

## 2023-09-13 MED ORDER — POMALIDOMIDE 3 MG PO CAPS: ORAL_CAPSULE | ORAL | 0 refills | Status: DC

## 2023-09-14 ENCOUNTER — Encounter: Payer: Medicare Other | Admitting: Physical Therapy

## 2023-09-16 ENCOUNTER — Ambulatory Visit: Payer: Medicare Other | Admitting: Physical Therapy

## 2023-09-16 DIAGNOSIS — M6281 Muscle weakness (generalized): Secondary | ICD-10-CM

## 2023-09-16 DIAGNOSIS — R262 Difficulty in walking, not elsewhere classified: Secondary | ICD-10-CM

## 2023-09-16 DIAGNOSIS — R2689 Other abnormalities of gait and mobility: Secondary | ICD-10-CM | POA: Diagnosis not present

## 2023-09-16 NOTE — Therapy (Signed)
OUTPATIENT PHYSICAL THERAPY TREATMENT      Patient Name: Jesse Mcclure. MRN: [id number] DOB:1951-06-01, 72 y.o., adult Today's Date: 09/16/2023  Progress Note Reporting Period 10/15 to 09/16/23  See note below for Objective Data and Assessment of Progress/Goals.     PCP: Sharlot Gowda MD REFERRING PROVIDER: Artis Delay MD  END OF SESSION:  PT End of Session - 09/16/23 1451     Visit Number 20    Date for PT Re-Evaluation 10/14/23    Authorization Type Medicare    Progress Note Due on Visit 30    PT Start Time 1447    PT Stop Time 1530    PT Time Calculation (min) 43 min    Activity Tolerance Patient tolerated treatment well                 Past Medical History:  Diagnosis Date   BPH (benign prostatic hypertrophy)    Degenerative disc disease    Diverticulosis    Hemochromatosis    HTN (hypertension)    Hyperlipidemia    Multiple myeloma (HCC)    Polio    Status post Nissen fundoplication (without gastrostomy tube) procedure    for hiatal hernia.   Past Surgical History:  Procedure Laterality Date   FOOT SURGERY     HIATAL HERNIA REPAIR     OTHER SURGICAL HISTORY     Cataract Surgery--Both eyes   SKIN BIOPSY Right 04/13/2022   squamous cell carcinoma in stiu arising in actinic keratosis   TOTAL KNEE ARTHROPLASTY Left 07/15/2021   Procedure: TOTAL KNEE ARTHROPLASTY;  Surgeon: Teryl Lucy, MD;  Location: [facility];  Service: Orthopedics;  Laterality: Left;   Undescended testes Right 10/05/1966   Patient Active Problem List   Diagnosis Date Noted   Bone loss of mandible 08/27/2023   Snoring 06/08/2023   Chronic midline low back pain without sciatica 06/01/2023   Idiopathic gout 06/01/2023   (HFpEF) heart failure with preserved ejection fraction (HCC) 05/17/2023   Shortness of breath 05/16/2023   Essential tremor 05/11/2023   Balance disorder 04/23/2023   Squamous cell carcinoma in situ 04/14/2023   Melanoma in situ (HCC) 04/14/2023    History of pulmonary embolus (PE) 09/04/2022   History of basal cell cancer 05/01/2022   First degree AV block 04/22/2022   History of skin cancer 04/21/2022   S/P TKR (total knee replacement), left 07/15/2021   Osteoarthritis of left knee 05/22/2021   Deficiency anemia 01/07/2021   ACE-inhibitor cough 12/16/2020   Tremor of both hands 10/11/2019   Peripheral neuropathy due to chemotherapy (HCC) 10/11/2019   Dry skin 10/11/2019   Insomnia disorder 09/06/2019   Loose stools 09/06/2019   S/P bone marrow transplant (HCC) 08/30/2019   Hypocalcemia 06/14/2019   Multiple myeloma without remission (HCC) 04/04/2019   Other constipation 04/04/2019   Glucose intolerance (impaired glucose tolerance) 05/20/2016   Vitamin D deficiency 05/20/2016   Chronic musculoskeletal pain 01/09/2016   History of orchiectomy, unilateral 05/20/2015   Obesity (BMI 30-39.9) 05/20/2015   Elevated PSA 05/20/2015   Arthritis 05/20/2015   Essential hypertension 03/02/2012   Hyperlipidemia 03/02/2012   Hemochromatosis, hereditary (HCC) 11/28/2008    ONSET DATE: > 1 year  REFERRING DIAG: G25.2 intention tremor; R26.89 balance disorder  THERAPY DIAG: difficulty in walking; fall risk Muscle weakness (generalized)  Difficulty in walking, not elsewhere classified  Balance disorder  Rationale for Evaluation and Treatment: Rehabilitation  SUBJECTIVE:  SUBJECTIVE STATEMENT: I feel I am still improving.  I can stand longer periods of time. I stood at a party at [employer] for a long time.    I did get a handicapped parking Eval: Being treated for cancer 4-5 years including oral chemo at home;  Dr wants me to walk and I use poles b/c I'm very wobbly.  Dr. says it's not the medication causing this.  Went on cruise in [place]  a few months ago but I couldn't tour and stayed on the boat. No falls but several close calls: 3-4 steps garage steps into the house, stumbled going up.  If I sit a while then get up and take a few steps, will feel wobbly.  Hold onto something to pick up something from the floor. Denies loss of sensation in feet. I sleep a lot.  I'm very sedentary.   My partner thinks I need to move more.  I ride the stationary bike Tuesday/Thursday occasionally.  I used to have a trainer prior to getting sick. I get wheelchair service at airports. [place] trip in [place]  Considering [facility]  Goes by [name]  PERTINENT HISTORY:  Oncology History  Multiple myeloma without remission (HCC)  03/20/2019 Imaging    1. Tumor involving the L5 and S1 and S2 segments of the spine as described with mass effects upon the left L4, L5, S1 and more distal left sacral nerves   Left TKR HTN Back pain  PAIN:  Are you having pain? Yes  NPRS scale:  7/10 Pain location: LBP   Aggravating factors: standing in the kitchen Relieving factors: ibuprofen, oxy if it gets bad   PRECAUTIONS: Fall    WEIGHT BEARING RESTRICTIONS: No  FALLS: Has patient fallen in last 6 months? No  LIVING ENVIRONMENT: Lives with: partner Lives in: House/apartment 2 story main living downstairs Stairs: Yes: Internal: 10 steps; on right going up and External: 3 steps; on left going up Has following equipment at home: walking poles   PLOF: partner does cooking, pt does some grilling Enjoys being with friends, hanging out; doesn't go to social events  b/c I can't stand for long time 10 minutes  PATIENT GOALS: I want to walk better, stand longer; going to [place] in May for 11 days; ride bicycles there;  I'd like to not have to use wheelchair service at the airport  The Patient-Specific Functional Scale  Initial:  I am going to ask you to identify up to 3 important activities that you are unable to do or are having difficulty with as a result  of this problem.  Today are there any activities that you are unable to do or having difficulty with because of this?  (Patient shown scale and patient rated each activity)  Follow up: When you first came in you had difficulty performing these activities.  Today do you still have difficulty?  Patient-Specific activity scoring scheme (Point to one number):  0 1 2 3 4 5 6 7 8 9  10 Unable  Able to perform To perform                                                                                                    activity at the same Activity         Level as before                                                                                                                       Injury or problem  Activity     Standing for parties                                    Initial:       4               11/14:   8/10 much improved! 2.        Going to [employer] show                              Initial:    3                  11/14:  8/10 improved!    OBJECTIVE:    COGNITION: Overall cognitive status: Within functional limits for tasks assessed    LOWER EXTREMITY ROM:   grossly WFLs  LOWER EXTREMITY MMT:    MMT Right Eval Right  07/20/23 Left Eval Left  07/20/23  Hip flexion  4  4  Hip extension      Hip abduction 4 4+ 4 4+  Hip adduction      Hip internal rotation      Hip external rotation      Knee flexion      Knee extension 4 4 4 4   Ankle dorsiflexion 4 4 4 4   Ankle plantarflexion 4 4 4 4   Ankle inversion      Ankle eversion      (Blank rows = not tested)           TRUNK STRENGTH:  Decreased activation of transverse abdominus muscles; abdominals 4-/5; decreased activation of lumbar multifidi; trunk extensors 4-/5 07/20/23:  Improved to 4/5                                  GAIT: Comments: wider base of support; decreased step length; decreased dorsiflexion  FUNCTIONAL  TESTS:  5x sit to stand: 20.31 no hands TUG 12.11 sec no hands 2 min walk test 366 feet RPE 7/10 BERG balance test:  43/56 indicating a 50% risk of falls; most challenged by single limb standing or narrow base of support; decreased speed of movement, limited forward reach  9/19: 5 sit to stand 12.33 no hands TUG 11.61 no hands BERG: 49/56 Dynamic Gait Index: 18/24 difficulty with head turns and head extension  10/15: 5 sit to stand 12.98 no hands TUG 7.53 sec no hands BERG: 49/56 2 min walk test: 420.7 feet with RPE of 7/10  11/14: 12.72 5x STS TUG 8.37 6 MWT 866 1 rest break RPE 5/10 O2 95% HR85   12/12: 5x STS 12.97 no hand Tug 7.92 6 MWT 981 RPE 7/10  MINI-BESTest: Balance Evaluation Systems Test ANTICIPATORY: SIT TO STAND: (2) normal without use of hands and stabilizes independently     (1) Moderate comes to stand WITH use of hands on 1st attempt    (0) Severe: unable to stand up from chair without assistance OR needs several attempts with use of   hands Score:2  2. RISE TO TOES: feet shoulder width apart. Hands on hips.  Rise as high as you can onto your toes. Try to hold this pose for 3 sec                          (2) stable for 3s with maximum height    (1) heels up, but not full range (smaller than when holding hands) OR noticeable instability for 3 sec                           (0) < or equat to 3 s  Score: 1   12/12  2  3. STAND ON ONE LEG: look straight ahead. Hands on hips. Lift your leg off the ground.     Left:  trial 1 __2__ trial 2__2__                                    Right: Trial 1:___2__   Trial 2:___2____   (2) 20 s       (2)  20 s                (1) < 20      (1) < 20 s   (0) Unable      (0) unable Score (use the worst side): 1   12/12 1  REACTIVE POSTURAL CONTROL 4. COMPENSATORY STEPPING CORRECTION FORWARD stand with feet shoulder width apart, arms at your sides. Lean forward against my hands beyond your forward limits.  When I let go, do  whatever is necessary, including taking a step, to avoid a fall   (2) recovers independently  with a single, large step, to avoid a fall   (1) more than one step used to recover equilibrium   (0) No step OR would fall if not caught Score: 1  5. COMPENSATORY STEPPING CORRECTION BACKWARD: Lean backward against my hands beyond your backward limits.  When I let go, do whatever is necessary, including a step to avoid a fall. (2) recovers independently  with a single, large step, to avoid a fall   (1) more than one step used to recover equilibrium   (0) No step OR would fall if  not caught Score: 1  6. COMPENSATORY STEPPING LATERAL: Lean into my hand beyond your sideways limit.  When I let go, do whatever is necessary, including taking a step, to avoid a fall. Left           Right   (2) recovers independently with 1 step (crossover or lateral OK)   (1) several steps to recover equilibrium   (0) falls or cannot step Score: (use the side with the lowest score)  0  SENSORY ORIENTATION 7. STANCE FEET TOGETHER EYES OPEN  firm surface Be as stable and still as possible until I say stop   (2) 30s   (1) < 30 s   (0) unable Score: 2  8. STANCE ON FOAM EYES CLOSED feet together, eyes closed, hands on hips. Be as stable and still as you can until I stay stop.  I will start timing when you close your eyes. Time in seconds:      (2) 30s   (1) <30 s   (0) unable Score: 1   12/12 0  9. INCLINE EYES CLOSED: Stand on incline with feet shoulder width apart, arms down at your sides.  I will start timing when you close your eyes   (2) Stands 30 s and aligns with gravity   (1) stands < 30 s OR aligns with surface   (0) unable Score: 2  DYNAMIC GAIT 10. CHANGE IN GAIT SPEED: begin walking at your normal speed, when I tell you "fast", walk as fast as you can, when I say "slow" walk very slowly   (2) significantly changes walking speed without imbalance   (1) unable to change walking speed or signs of  imbalance   (0) unable to achieve significant change in walking speed AND signes of imbalance Score: 2  11. WALK WITH HEAD TURNS HORIZONTAL: begin walking at normal speed, when I say right, turn your head to the right, when I say left turn your head to the left.  Try to keep yourself walking in a straight line   (2) performs head turns with no change in gait speed and good balance   (1) performs head turns with reduction in gait speed   (0)  performs head turns with imbalance Score: 2  12. WALK WITH PIVOT TURNS: begin walking at your normal speed.  When I tell you to "turn and stop", turn as quickly as you can, face the opposite direction and stop.  After the turn, your feet should be close together   (2) turns with feet close FAST in 3 steps with good balance   (1) turns with feet close SLOW > 4 steps with good balance   (0) cannot turn with feet close at any speed without imbalance Score: 2  13. STEP OVER OBSTACLES:  ( 9 inch height 10 feet away from subject) begin walking at your normal speed.  When you get to the box, step over it, not around it and keep walking   (2) Able to step over box with minimal change of gait speed and good balance   (1) Steps over box but touches box OR displays cautious behavior by slowing gait   (0) Unable to step over box OR steps around box Score: 1  14. TIMED UP AND GO NORMAL AND WITH DUAL TASK: 3 METER WALK: When I say go walk at your your normal speed across the tape, turn around and come back to sit in the chair   Time: Count backwards  by 3s starting at    20.  When I say go, stand up from the chair, walk at your normal speed across the tape on the floor, turn around, come back to sit in the chair.  Continue counting backwards the entire time.  Time:   (2) No noticeable change in sitting, standing or walking while backward counting when compared to TUG without dual task   (1) Dual task affects either counting OR walking (>10%)    (0) stops counting  while walking OR stops walking while counting If gait speed slows more than 10% between TUG without and with dual task, the score should be decreased by a point Score:  1  Total score:         19  /28 12/12:  21/28       TODAY'S TREATMENT:       DATE:   12/12: NuStep L 3 x 5 min (blue machine)-PT present to discuss progress Standing lat bar 35# 10x 2 Sit ups with 10# kettlebell press overhead 12x  Resisted walk: 15# backward only 10x Leg press: reclined back (worked well) seat 9 80#  bil 15x2; 40# single leg right/left  15x  each side 5x STS TUG 6 MWT MiniBESTtest 12/5: NuStep L 3 x 5 min (blue machine)-PT present to discuss progress Standing lat bar 35# 10x 2 Seated lumbar stretch 3 way blue ball rolls 15x Seated red power cord trunk extension 20x Up and down steps 2 railings 4x Chair sit ups 10# kettlebell 15x 6 inch step taps holding 10# kettlebell 2 sets of 10x with light UE support walk 2 min some increase in back pain and shortness of breath/fatigue 11/21: NuStep L 5 x 5 min (green machine)-PT present to discuss progress Standing lat bar 35# 15x 2 Seated lumbar stretch 3 way blue ball rolls 15x Resisted cable walk 15# 10x Chair sit ups 10# kettlebell 15x Standing cable rows 15# 15x 6 inch step taps 20x with light UE support 6 inch step up 10x right/left with light bil railing support Resisted cable walk 15# 10x walk 2 min some increase in back pain and shortness of breath/fatigue  08/24/23 NuStep L 3 x 5 min (blue machine)-PT present to discuss progress 2nd step hip flexor stretch 10x right/left Heel raises x10 Step taps 10x 5x step ups 6 inch right/left Standing blue band rows 10x Seated 5# chops 10x2 Chair sit ups 5# press overhead 10x Standing lat bar 30# 15x 2 sets (increase weight to 35# next time) Leg press: reclined back (worked well) seat 9 90#  bil 15x2; 40# single leg right/left  15x 2 each Resisted cable walk 15# 10x walk 2 min "I'm  whooped" 08/19/23 NuStep L 3 x 5 min (blue machine)-PT present to discuss progress 5x STS TUG 6 MWT Mini best test PSFS Review of progress toward goals   PATIENT EDUCATION: Education details: educated on slow long breaths in through nose and long slow breaths out through pursed lips to avoid hyperventilation.  Person educated: Patient Education method: Explanation Education comprehension: verbalized understanding  HOME EXERCISE PROGRAM: To be started  GOALS: Goals reviewed with patient? Yes  SHORT TERM GOALS: Target date: 05/27/2023   The patient will demonstrate knowledge of basic self care strategies and exercises to promote strength and mobility  Baseline: Goal status: met 9/19  2.  Able to walk 2 min 375 feet with RPE 5/10 Baseline:  Goal status: partially met RPE 7/10 today but much improved distance  3.  The patient will have an improved BERG balance score to   46 /56 indicating reduced risk of falls Baseline:  Goal status: met 9/19  4.  Improved LE strength and balance  indicated by improved 5x STS to 16 sec Baseline:  Goal status: met 9/19  5.  Set DGI for MiniBESTest goal Baseline:  Goal status: met 9/19    LONG TERM GOALS: Target date: 10/14/2023   The patient will be independent in a safe self progression of a home exercise program/gym program to promote further recovery of function  Baseline:  Goal status: MET  2.  The patient will have improved gait stamina and speed needed to ambulate 600 feet in 6 minutes with RPE of 6/10 Baseline:  Goal status: =met 11/14  3.  The patient will have an improved BERG balance score to  48  /56 indicating reduced risk of falls  Baseline:  Goal status: met 9/16  4.  The patient will have improved Timed Up and Go (TUG) time to    <12   sec indicating improved gait speed and LE strength  Baseline:  Goal status: met 9/19  5.  The patient will have improved hip and knee strength to at least 4+/5 needed for  standing >10 min for washing dishes Baseline:  Goal status: ongoing  6. Improved balance with DGI to 21/24 indicating decreased risk of falls  7. Crowds at [facility] and walk to car/parking lot with PSFS to 5 Met 11/14 8. Standing for 3 minutes for parties for socializing with PSFS of 6 Met 11/14  9. Mini best test improved to 23/28 indicating improved balance and dec risk of falls  10.  The patient will have improved gait stamina and speed needed to ambulate 950 feet in 6 minutes Met 12/12     ASSESSMENT:  CLINICAL IMPRESSION: Much improved gait speed with 6 MWT with shorter rest break needed.  5x STS and TUG also significantly better since start of care.  Impairments with balance particularly sensory orientation and reactionary balance per MiniBESTtest.  The patient would benefit from a continuation of skilled PT for a further progression of strengthening and functional mobility.  Will continue to update and promote independence in a HEP needed for a return to the highest functional level possible with ADLs.      OBJECTIVE IMPAIRMENTS: cardiopulmonary status limiting activity, decreased activity tolerance, decreased balance, decreased endurance, decreased mobility, difficulty walking, decreased strength, impaired perceived functional ability, and pain.   ACTIVITY LIMITATIONS: carrying, lifting, bending, standing, squatting, and locomotion level  PARTICIPATION LIMITATIONS: meal prep, cleaning, interpersonal relationship, shopping, and community activity  PERSONAL FACTORS: Fitness, Time since onset of injury/illness/exacerbation, and 3+ comorbidities: multiple myeloma, HTN, DDD lumbar spine  are also affecting patient's functional outcome.   REHAB POTENTIAL: Good  CLINICAL DECISION MAKING: Evolving/moderate complexity  EVALUATION COMPLEXITY: Moderate  PLAN:  PT FREQUENCY: 2x/week  PT DURATION: 8 weeks  PLANNED INTERVENTIONS: Therapeutic exercises, Therapeutic activity,  Neuromuscular re-education, Balance training, Gait training, Patient/Family education, Self Care, Stair training, Aquatic Therapy, Cryotherapy, Moist heat, Manual therapy, and Re-evaluation  PLAN FOR NEXT SESSION: balance soft surface; eyes closed; KX;  leg press recline seat:  dual task walking, reactionary balance, eyes closed and foam balance; give [employer] program;  resisted backward walk; general strengthening; gradually increase time on his feet; step over obstacles for toe clearance/balance  Lavinia Sharps, PT 09/16/23 2:52 PM Phone: [phone] Fax: [phone]

## 2023-09-23 ENCOUNTER — Other Ambulatory Visit: Payer: Self-pay | Admitting: Hematology and Oncology

## 2023-09-23 MED ORDER — OXYCODONE HCL 5 MG PO TABS: 5.0000 mg | ORAL_TABLET | ORAL | 0 refills | Status: DC | PRN

## 2023-10-08 ENCOUNTER — Other Ambulatory Visit: Payer: Self-pay

## 2023-10-08 DIAGNOSIS — C9000 Multiple myeloma not having achieved remission: Secondary | ICD-10-CM

## 2023-10-08 MED ORDER — POMALIDOMIDE 3 MG PO CAPS: ORAL_CAPSULE | ORAL | 0 refills | Status: DC

## 2023-10-11 ENCOUNTER — Other Ambulatory Visit: Payer: Self-pay | Admitting: Hematology and Oncology

## 2023-10-11 ENCOUNTER — Telehealth: Payer: Self-pay

## 2023-10-11 MED ORDER — OXYCODONE HCL 5 MG PO TABS: 5.0000 mg | ORAL_TABLET | ORAL | 0 refills | Status: DC | PRN

## 2023-10-11 NOTE — Telephone Encounter (Signed)
Called and given below message. He verbalized understanding. 

## 2023-10-11 NOTE — Telephone Encounter (Signed)
 He called and left a message requesting Oxycodone refill to pharmacy please.

## 2023-10-11 NOTE — Telephone Encounter (Signed)
 done

## 2023-10-12 ENCOUNTER — Ambulatory Visit: Payer: Medicare Other | Attending: Hematology and Oncology | Admitting: Physical Therapy

## 2023-10-12 DIAGNOSIS — R262 Difficulty in walking, not elsewhere classified: Secondary | ICD-10-CM | POA: Insufficient documentation

## 2023-10-12 DIAGNOSIS — M6281 Muscle weakness (generalized): Secondary | ICD-10-CM | POA: Diagnosis not present

## 2023-10-12 DIAGNOSIS — R2689 Other abnormalities of gait and mobility: Secondary | ICD-10-CM | POA: Diagnosis not present

## 2023-10-12 NOTE — Therapy (Signed)
 OUTPATIENT PHYSICAL THERAPY TREATMENT      Patient Name: Jesse Mcclure. MRN: [id number] DOB:01-02-1951, 73 y.o., adult Today's Date: 10/12/2023   PCP: Joyce Rush MD REFERRING PROVIDER: Lonn Hicks MD  END OF SESSION:  PT End of Session - 10/12/23 1354     Visit Number 21    Date for PT Re-Evaluation 10/14/23    Authorization Type Medicare    Progress Note Due on Visit 30    PT Start Time 1400    PT Stop Time 1440    PT Time Calculation (min) 40 min    Activity Tolerance Patient tolerated treatment well                 Past Medical History:  Diagnosis Date   BPH (benign prostatic hypertrophy)    Degenerative disc disease    Diverticulosis    Hemochromatosis    HTN (hypertension)    Hyperlipidemia    Multiple myeloma (HCC)    Polio    Status post Nissen fundoplication (without gastrostomy tube) procedure    for hiatal hernia.   Past Surgical History:  Procedure Laterality Date   FOOT SURGERY     HIATAL HERNIA REPAIR     OTHER SURGICAL HISTORY     Cataract Surgery--Both eyes   SKIN BIOPSY Right 04/13/2022   squamous cell carcinoma in stiu arising in actinic keratosis   TOTAL KNEE ARTHROPLASTY Left 07/15/2021   Procedure: TOTAL KNEE ARTHROPLASTY;  Surgeon: Josefina Chew, MD;  Location: [facility];  Service: Orthopedics;  Laterality: Left;   Undescended testes Right 10/05/1966   Patient Active Problem List   Diagnosis Date Noted   Bone loss of mandible 08/27/2023   Snoring 06/08/2023   Chronic midline low back pain without sciatica 06/01/2023   Idiopathic gout 06/01/2023   (HFpEF) heart failure with preserved ejection fraction (HCC) 05/17/2023   Shortness of breath 05/16/2023   Essential tremor 05/11/2023   Balance disorder 04/23/2023   Squamous cell carcinoma in situ 04/14/2023   Melanoma in situ (HCC) 04/14/2023   History of pulmonary embolus (PE) 09/04/2022   History of basal cell cancer 05/01/2022   First degree AV block 04/22/2022    History of skin cancer 04/21/2022   S/P TKR (total knee replacement), left 07/15/2021   Osteoarthritis of left knee 05/22/2021   Deficiency anemia 01/07/2021   ACE-inhibitor cough 12/16/2020   Tremor of both hands 10/11/2019   Peripheral neuropathy due to chemotherapy (HCC) 10/11/2019   Dry skin 10/11/2019   Insomnia disorder 09/06/2019   Loose stools 09/06/2019   S/P bone marrow transplant (HCC) 08/30/2019   Hypocalcemia 06/14/2019   Multiple myeloma without remission (HCC) 04/04/2019   Other constipation 04/04/2019   Glucose intolerance (impaired glucose tolerance) 05/20/2016   Vitamin D  deficiency 05/20/2016   Chronic musculoskeletal pain 01/09/2016   History of orchiectomy, unilateral 05/20/2015   Obesity (BMI 30-39.9) 05/20/2015   Elevated PSA 05/20/2015   Arthritis 05/20/2015   Essential hypertension 03/02/2012   Hyperlipidemia 03/02/2012   Hemochromatosis, hereditary (HCC) 11/28/2008    ONSET DATE: > 1 year  REFERRING DIAG: G25.2 intention tremor; R26.89 balance disorder  THERAPY DIAG: difficulty in walking; fall risk Muscle weakness (generalized)  Difficulty in walking, not elsewhere classified  Balance disorder  Rationale for Evaluation and Treatment: Rehabilitation  SUBJECTIVE:  SUBJECTIVE STATEMENT: I'm just now getting over an upper respiratory infection and so unable to do the stationary bike.  The medicine I was on helped my back.       I did get  handicapped parking Eval: Being treated for cancer 4-5 years including oral chemo at home;  Dr wants me to walk and I use poles b/c I'm very wobbly.  Dr. says it's not the medication causing this.  Went on cruise in [place] a few months ago but I couldn't tour and stayed on the boat. No falls but several close calls: 3-4 steps  garage steps into the house, stumbled going up.  If I sit a while then get up and take a few steps, will feel wobbly.  Hold onto something to pick up something from the floor. Denies loss of sensation in feet. I sleep a lot.  I'm very sedentary.   My partner thinks I need to move more.  I ride the stationary bike Tuesday/Thursday occasionally.  I used to have a trainer prior to getting sick. I get wheelchair service at airports. [place] trip in May  Considering [facility]  [facility] [employer]  PERTINENT HISTORY:  Oncology History  Multiple myeloma without remission (HCC)  03/20/2019 Imaging    1. Tumor involving the L5 and S1 and S2 segments of the spine as described with mass effects upon the left L4, L5, S1 and more distal left sacral nerves   Left TKR HTN Back pain  PAIN:  Are you having pain? Yes  NPRS scale:  5/10 Pain location: LBP   Aggravating factors: standing in the kitchen Relieving factors: ibuprofen , oxy if it gets bad   PRECAUTIONS: Fall    WEIGHT BEARING RESTRICTIONS: No  FALLS: Has patient fallen in last 6 months? No  LIVING ENVIRONMENT: Lives with: partner Lives in: House/apartment 2 story main living downstairs Stairs: Yes: Internal: 10 steps; on right going up and External: 3 steps; on left going up Has following equipment at home: walking poles   PLOF: partner does cooking, pt does some grilling Enjoys being with friends, hanging out; doesn't go to social events  b/c I can't stand for long time 10 minutes  PATIENT GOALS: I want to walk better, stand longer; going to [place] in May for 11 days; ride bicycles there;  I'd like to not have to use wheelchair service at the airport  The Patient-Specific Functional Scale  Initial:  I am going to ask you to identify up to 3 important activities that you are unable to do or are having difficulty with as a result of this problem.  Today are there any activities that you are unable to do or having difficulty with  because of this?  (Patient shown scale and patient rated each activity)  Follow up: When you first came in you had difficulty performing these activities.  Today do you still have difficulty?  Patient-Specific activity scoring scheme (Point to one number):  0 1 2 3 4 5 6 7 8 9  10 Unable  Able to perform To perform                                                                                                    activity at the same Activity         Level as before                                                                                                                       Injury or problem  Activity     Standing for parties                                    Initial:       4               11/14:   8/10 much improved! 2.        Going to [employer] show                              Initial:    3                  11/14:  8/10 improved!    OBJECTIVE:    COGNITION: Overall cognitive status: Within functional limits for tasks assessed    LOWER EXTREMITY ROM:   grossly WFLs  LOWER EXTREMITY MMT:    MMT Right Eval Right  07/20/23 Left Eval Left  07/20/23  Hip flexion  4  4  Hip extension      Hip abduction 4 4+ 4 4+  Hip adduction      Hip internal rotation      Hip external rotation      Knee flexion      Knee extension 4 4 4 4   Ankle dorsiflexion 4 4 4 4   Ankle plantarflexion 4 4 4 4   Ankle inversion      Ankle eversion      (Blank rows = not tested)           TRUNK STRENGTH:  Decreased activation of transverse abdominus muscles; abdominals 4-/5; decreased activation of lumbar multifidi; trunk extensors 4-/5 07/20/23:  Improved to 4/5                                  GAIT: Comments: wider base of support; decreased step length; decreased dorsiflexion  FUNCTIONAL TESTS:  5x  sit to stand: 20.31 no hands TUG 12.11 sec no hands 2 min walk test 366 feet RPE  7/10 BERG balance test:  43/56 indicating a 50% risk of falls; most challenged by single limb standing or narrow base of support; decreased speed of movement, limited forward reach  9/19: 5 sit to stand 12.33 no hands TUG 11.61 no hands BERG: 49/56 Dynamic Gait Index: 18/24 difficulty with head turns and head extension  10/15: 5 sit to stand 12.98 no hands TUG 7.53 sec no hands BERG: 49/56 2 min walk test: 420.7 feet with RPE of 7/10  11/14: 12.72 5x STS TUG 8.37 6 MWT 866 1 rest break RPE 5/10 O2 95% HR85   12/12: 5x STS 12.97 no hand Tug 7.92 6 MWT 981 RPE 7/10  MINI-BESTest: Balance Evaluation Systems Test ANTICIPATORY: SIT TO STAND: (2) normal without use of hands and stabilizes independently     (1) Moderate comes to stand WITH use of hands on 1st attempt    (0) Severe: unable to stand up from chair without assistance OR needs several attempts with use of   hands Score:2  2. RISE TO TOES: feet shoulder width apart. Hands on hips.  Rise as high as you can onto your toes. Try to hold this pose for 3 sec                          (2) stable for 3s with maximum height    (1) heels up, but not full range (smaller than when holding hands) OR noticeable instability for 3 sec                           (0) < or equat to 3 s  Score: 1   12/12  2  3. STAND ON ONE LEG: look straight ahead. Hands on hips. Lift your leg off the ground.     Left:  trial 1 __2__ trial 2__2__                                    Right: Trial 1:___2__   Trial 2:___2____   (2) 20 s       (2)  20 s                (1) < 20      (1) < 20 s   (0) Unable      (0) unable Score (use the worst side): 1   12/12 1  REACTIVE POSTURAL CONTROL 4. COMPENSATORY STEPPING CORRECTION FORWARD stand with feet shoulder width apart, arms at your sides. Lean forward against my hands beyond your forward limits.  When I let go, do whatever is necessary, including taking a step, to avoid a fall   (2) recovers independently  with  a single, large step, to avoid a fall   (1) more than one step used to recover equilibrium   (0) No step OR would fall if not caught Score: 1  5. COMPENSATORY STEPPING CORRECTION BACKWARD: Lean backward against my hands beyond your backward limits.  When I let go, do whatever is necessary, including a step to avoid a fall. (2) recovers independently  with a single, large step, to avoid a fall   (1) more than one step used to recover equilibrium   (0) No step OR would fall if  not caught Score: 1  6. COMPENSATORY STEPPING LATERAL: Lean into my hand beyond your sideways limit.  When I let go, do whatever is necessary, including taking a step, to avoid a fall. Left           Right   (2) recovers independently with 1 step (crossover or lateral OK)   (1) several steps to recover equilibrium   (0) falls or cannot step Score: (use the side with the lowest score)  0  SENSORY ORIENTATION 7. STANCE FEET TOGETHER EYES OPEN  firm surface Be as stable and still as possible until I say stop   (2) 30s   (1) < 30 s   (0) unable Score: 2  8. STANCE ON FOAM EYES CLOSED feet together, eyes closed, hands on hips. Be as stable and still as you can until I stay stop.  I will start timing when you close your eyes. Time in seconds:      (2) 30s   (1) <30 s   (0) unable Score: 1   12/12 0  9. INCLINE EYES CLOSED: Stand on incline with feet shoulder width apart, arms down at your sides.  I will start timing when you close your eyes   (2) Stands 30 s and aligns with gravity   (1) stands < 30 s OR aligns with surface   (0) unable Score: 2  DYNAMIC GAIT 10. CHANGE IN GAIT SPEED: begin walking at your normal speed, when I tell you fast, walk as fast as you can, when I say slow walk very slowly   (2) significantly changes walking speed without imbalance   (1) unable to change walking speed or signs of imbalance   (0) unable to achieve significant change in walking speed AND signes of imbalance Score:  2  11. WALK WITH HEAD TURNS HORIZONTAL: begin walking at normal speed, when I say right, turn your head to the right, when I say left turn your head to the left.  Try to keep yourself walking in a straight line   (2) performs head turns with no change in gait speed and good balance   (1) performs head turns with reduction in gait speed   (0)  performs head turns with imbalance Score: 2  12. WALK WITH PIVOT TURNS: begin walking at your normal speed.  When I tell you to turn and stop, turn as quickly as you can, face the opposite direction and stop.  After the turn, your feet should be close together   (2) turns with feet close FAST in 3 steps with good balance   (1) turns with feet close SLOW > 4 steps with good balance   (0) cannot turn with feet close at any speed without imbalance Score: 2  13. STEP OVER OBSTACLES:  ( 9 inch height 10 feet away from subject) begin walking at your normal speed.  When you get to the box, step over it, not around it and keep walking   (2) Able to step over box with minimal change of gait speed and good balance   (1) Steps over box but touches box OR displays cautious behavior by slowing gait   (0) Unable to step over box OR steps around box Score: 1  14. TIMED UP AND GO NORMAL AND WITH DUAL TASK: 3 METER WALK: When I say go walk at your your normal speed across the tape, turn around and come back to sit in the chair   Time: Count backwards  by 3s starting at    20.  When I say go, stand up from the chair, walk at your normal speed across the tape on the floor, turn around, come back to sit in the chair.  Continue counting backwards the entire time.  Time:   (2) No noticeable change in sitting, standing or walking while backward counting when compared to TUG without dual task   (1) Dual task affects either counting OR walking (>10%)    (0) stops counting while walking OR stops walking while counting If gait speed slows more than 10% between TUG without and  with dual task, the score should be decreased by a point Score:  1  Total score:         19  /28 12/12:  21/28       TODAY'S TREATMENT:       DATE:   10/12/2023 NuStep L 3 x 5 min (blue machine)-PT present to discuss progress Standing lat bar 35# 10x 2 Seated trunk rotation 4# 30 sec Sit ups with 4# with press overhead 20x  Resisted walk: 15# backward only 10x Seated dead lifts 10# 15x Leg press: reclined back seat 9 80#  bil 15x2;  40# single leg right/left  15x  each side Walking 2 min (moderate short of breath)  12/12: NuStep L 3 x 5 min (blue machine)-PT present to discuss progress Standing lat bar 35# 10x 2 Sit ups with 10# kettlebell press overhead 12x  Resisted walk: 15# backward only 10x Leg press: reclined back (worked well) seat 9 80#  bil 15x2; 40# single leg right/left  15x  each side 5x STS TUG 6 MWT MiniBESTtest 12/5: NuStep L 3 x 5 min (blue machine)-PT present to discuss progress Standing lat bar 35# 10x 2 Seated lumbar stretch 3 way blue ball rolls 15x Seated red power cord trunk extension 20x Up and down steps 2 railings 4x Chair sit ups 10# kettlebell 15x 6 inch step taps holding 10# kettlebell 2 sets of 10x with light UE support walk 2 min some increase in back pain and shortness of breath/fatigue 11/21: NuStep L 5 x 5 min (green machine)-PT present to discuss progress Standing lat bar 35# 15x 2 Seated lumbar stretch 3 way blue ball rolls 15x Resisted cable walk 15# 10x Chair sit ups 10# kettlebell 15x Standing cable rows 15# 15x 6 inch step taps 20x with light UE support 6 inch step up 10x right/left with light bil railing support Resisted cable walk 15# 10x walk 2 min some increase in back pain and shortness of breath/fatigue  08/24/23 NuStep L 3 x 5 min (blue machine)-PT present to discuss progress 2nd step hip flexor stretch 10x right/left Heel raises x10 Step taps 10x 5x step ups 6 inch right/left Standing blue band rows 10x Seated 5#  chops 10x2 Chair sit ups 5# press overhead 10x Standing lat bar 30# 15x 2 sets (increase weight to 35# next time) Leg press: reclined back (worked well) seat 9 90#  bil 15x2; 40# single leg right/left  15x 2 each Resisted cable walk 15# 10x walk 2 min I'm whooped   PATIENT EDUCATION: Education details: educated on slow long breaths in through nose and long slow breaths out through pursed lips to avoid hyperventilation.  Person educated: Patient Education method: Explanation Education comprehension: verbalized understanding    GOALS: Goals reviewed with patient? Yes  SHORT TERM GOALS: Target date: 05/27/2023   The patient will demonstrate knowledge of basic self care strategies and  exercises to promote strength and mobility  Baseline: Goal status: met 9/19  2.  Able to walk 2 min 375 feet with RPE 5/10 Baseline:  Goal status: partially met RPE 7/10 today but much improved distance  3.  The patient will have an improved BERG balance score to   46 /56 indicating reduced risk of falls Baseline:  Goal status: met 9/19  4.  Improved LE strength and balance  indicated by improved 5x STS to 16 sec Baseline:  Goal status: met 9/19  5.  Set DGI for MiniBESTest goal Baseline:  Goal status: met 9/19    LONG TERM GOALS: Target date: 10/14/2023   The patient will be independent in a safe self progression of a home exercise program/gym program to promote further recovery of function  Baseline:  Goal status: MET  2.  The patient will have improved gait stamina and speed needed to ambulate 600 feet in 6 minutes with RPE of 6/10 Baseline:  Goal status: =met 11/14  3.  The patient will have an improved BERG balance score to  48  /56 indicating reduced risk of falls  Baseline:  Goal status: met 9/16  4.  The patient will have improved Timed Up and Go (TUG) time to    <12   sec indicating improved gait speed and LE strength  Baseline:  Goal status: met 9/19  5.  The patient  will have improved hip and knee strength to at least 4+/5 needed for standing >10 min for washing dishes Baseline:  Goal status: ongoing  6. Improved balance with DGI to 21/24 indicating decreased risk of falls  7. Crowds at [facility] and walk to car/parking lot with PSFS to 5 Met 11/14 8. Standing for 3 minutes for parties for socializing with PSFS of 6 Met 11/14  9. Mini best test improved to 23/28 indicating improved balance and dec risk of falls  10.  The patient will have improved gait stamina and speed needed to ambulate 950 feet in 6 minutes Met 12/12     ASSESSMENT:  CLINICAL IMPRESSION: Pt returns after gap in care due to the holidays and pt illness. Extra time to recover from shortness of breath needed between ex's (particularly with standing exercises).  Resisted walking challenges dynamic balance today requiring supervision for safety.  Back pain low to moderate throughout session.  Therapist monitoring response to all interventions and modifying treatment accordingly.       OBJECTIVE IMPAIRMENTS: cardiopulmonary status limiting activity, decreased activity tolerance, decreased balance, decreased endurance, decreased mobility, difficulty walking, decreased strength, impaired perceived functional ability, and pain.   ACTIVITY LIMITATIONS: carrying, lifting, bending, standing, squatting, and locomotion level  PARTICIPATION LIMITATIONS: meal prep, cleaning, interpersonal relationship, shopping, and community activity  PERSONAL FACTORS: Fitness, Time since onset of injury/illness/exacerbation, and 3+ comorbidities: multiple myeloma, HTN, DDD lumbar spine  are also affecting patient's functional outcome.   REHAB POTENTIAL: Good  CLINICAL DECISION MAKING: Evolving/moderate complexity  EVALUATION COMPLEXITY: Moderate  PLAN:  PT FREQUENCY: 2x/week  PT DURATION: 8 weeks  PLANNED INTERVENTIONS: Therapeutic exercises, Therapeutic activity, Neuromuscular re-education,  Balance training, Gait training, Patient/Family education, Self Care, Stair training, Aquatic Therapy, Cryotherapy, Moist heat, Manual therapy, and Re-evaluation  PLAN FOR NEXT SESSION: balance soft surface/ Airex; eyes closed; KX;  leg press recline seat:  dual task walking, reactionary balance, eyes closed and foam balance; give [facility] Start program;  resisted backward walk; general strengthening; gradually increase time on his feet; step over obstacles for toe clearance/balance  Glade Pesa, PT 10/12/23 3:33 PM Phone: [phone] Fax: [phone]

## 2023-10-14 ENCOUNTER — Encounter: Payer: Medicare Other | Admitting: Physical Therapy

## 2023-10-15 ENCOUNTER — Other Ambulatory Visit (HOSPITAL_COMMUNITY): Payer: Self-pay

## 2023-10-15 ENCOUNTER — Telehealth: Payer: Self-pay

## 2023-10-15 ENCOUNTER — Other Ambulatory Visit: Payer: Self-pay

## 2023-10-15 DIAGNOSIS — C9000 Multiple myeloma not having achieved remission: Secondary | ICD-10-CM

## 2023-10-15 MED ORDER — IXAZOMIB CITRATE 3 MG PO CAPS: ORAL_CAPSULE | ORAL | 11 refills | Status: DC

## 2023-10-15 MED ORDER — POMALIDOMIDE 3 MG PO CAPS: ORAL_CAPSULE | ORAL | 0 refills | Status: DC

## 2023-10-15 NOTE — Telephone Encounter (Signed)
 Returned call and after asking oral chemo pharmacy sent to [facility] pharmacy.

## 2023-10-19 ENCOUNTER — Ambulatory Visit: Payer: Medicare Other | Admitting: Physical Therapy

## 2023-10-19 ENCOUNTER — Inpatient Hospital Stay: Payer: Medicare Other | Attending: Hematology and Oncology

## 2023-10-19 ENCOUNTER — Encounter: Payer: Self-pay | Admitting: Hematology and Oncology

## 2023-10-19 ENCOUNTER — Inpatient Hospital Stay (HOSPITAL_BASED_OUTPATIENT_CLINIC_OR_DEPARTMENT_OTHER): Payer: Medicare Other | Admitting: Hematology and Oncology

## 2023-10-19 VITALS — BP 134/64 | HR 65 | Temp 99.4°F | Resp 18 | Ht 69.0 in | Wt 234.6 lb

## 2023-10-19 DIAGNOSIS — Z86711 Personal history of pulmonary embolism: Secondary | ICD-10-CM | POA: Insufficient documentation

## 2023-10-19 DIAGNOSIS — M6281 Muscle weakness (generalized): Secondary | ICD-10-CM

## 2023-10-19 DIAGNOSIS — R262 Difficulty in walking, not elsewhere classified: Secondary | ICD-10-CM | POA: Diagnosis not present

## 2023-10-19 DIAGNOSIS — R2689 Other abnormalities of gait and mobility: Secondary | ICD-10-CM | POA: Diagnosis not present

## 2023-10-19 DIAGNOSIS — C9000 Multiple myeloma not having achieved remission: Secondary | ICD-10-CM | POA: Insufficient documentation

## 2023-10-19 DIAGNOSIS — G250 Essential tremor: Secondary | ICD-10-CM | POA: Insufficient documentation

## 2023-10-19 DIAGNOSIS — Z923 Personal history of irradiation: Secondary | ICD-10-CM | POA: Insufficient documentation

## 2023-10-19 DIAGNOSIS — Z7901 Long term (current) use of anticoagulants: Secondary | ICD-10-CM | POA: Diagnosis not present

## 2023-10-19 DIAGNOSIS — Z9221 Personal history of antineoplastic chemotherapy: Secondary | ICD-10-CM | POA: Insufficient documentation

## 2023-10-19 DIAGNOSIS — Z803 Family history of malignant neoplasm of breast: Secondary | ICD-10-CM | POA: Diagnosis not present

## 2023-10-19 DIAGNOSIS — G620 Drug-induced polyneuropathy: Secondary | ICD-10-CM | POA: Diagnosis not present

## 2023-10-19 LAB — COMPREHENSIVE METABOLIC PANEL
ALT: 20 U/L (ref 0–44)
AST: 22 U/L (ref 15–41)
Albumin: 3.8 g/dL (ref 3.5–5.0)
Alkaline Phosphatase: 57 U/L (ref 38–126)
Anion gap: 4 — ABNORMAL LOW (ref 5–15)
BUN: 17 mg/dL (ref 8–23)
CO2: 29 mmol/L (ref 22–32)
Calcium: 9.5 mg/dL (ref 8.9–10.3)
Chloride: 106 mmol/L (ref 98–111)
Creatinine, Ser: 0.78 mg/dL (ref 0.61–1.24)
GFR, Estimated: >60 mL/min (ref >60)
Glucose, Bld: 89 mg/dL (ref 70–99)
Potassium: 4 mmol/L (ref 3.5–5.1)
Sodium: 139 mmol/L (ref 135–145)
Total Bilirubin: 0.9 mg/dL (ref 0.0–1.2)
Total Protein: 6.9 g/dL (ref 6.5–8.1)

## 2023-10-19 LAB — CBC WITH DIFFERENTIAL/PLATELET
Abs Immature Granulocytes: 0.02 10*3/uL (ref 0.00–0.07)
Basophils Absolute: 0.1 10*3/uL (ref 0.0–0.1)
Basophils Relative: 2 %
Eosinophils Absolute: 0.1 10*3/uL (ref 0.0–0.5)
Eosinophils Relative: 3 %
HCT: 44 % (ref 39.0–52.0)
Hemoglobin: 15 g/dL (ref 13.0–17.0)
Immature Granulocytes: 0 %
Lymphocytes Relative: 35 %
Lymphs Abs: 1.7 10*3/uL (ref 0.7–4.0)
MCH: 33.8 pg (ref 26.0–34.0)
MCHC: 34.1 g/dL (ref 30.0–36.0)
MCV: 99.1 fL (ref 80.0–100.0)
Monocytes Absolute: 1.2 10*3/uL — ABNORMAL HIGH (ref 0.1–1.0)
Monocytes Relative: 23 %
Neutro Abs: 1.9 10*3/uL (ref 1.7–7.7)
Neutrophils Relative %: 37 %
Platelets: 251 10*3/uL (ref 150–400)
RBC: 4.44 MIL/uL (ref 4.22–5.81)
RDW: 14.8 % (ref 11.5–15.5)
WBC: 5 10*3/uL (ref 4.0–10.5)
nRBC: 0 % (ref 0.0–0.2)

## 2023-10-19 MED ORDER — EMPAGLIFLOZIN 10 MG PO TABS
10.0000 mg | ORAL_TABLET | Freq: Every day | ORAL | 2 refills | Status: DC
  Filled 2024-01-07: qty 30, 30d supply, fill #0
  Filled 2024-01-20 – 2024-01-31 (×2): qty 30, 30d supply, fill #1

## 2023-10-19 NOTE — Assessment & Plan Note (Signed)
 Reviewed recent myeloma panel which shows stable disease control He will continue antiviral treatment, calcium with vitamin D The patient needs dental extraction end of the month We will give him 6 weeks of healing and resume Zometa in March

## 2023-10-19 NOTE — Progress Notes (Signed)
 [facility] OFFICE PROGRESS NOTE  Patient Care Team: [name], MD as PCP - General (Family Medicine) Alvan Ronal BRAVO, MD as PCP - Cardiology (Cardiology) Lonn Hicks, MD as Consulting Physician (Hematology and Oncology) Lelon Glendia ONEIDA DEVONNA as Physician Assistant (Cardiology)  ASSESSMENT & PLAN:  Multiple myeloma without remission [facility]) Reviewed recent myeloma panel which shows stable disease control He will continue antiviral treatment, calcium  with vitamin D  The patient needs dental extraction end of the month We will give him 6 weeks of healing and resume Zometa  in March  History of pulmonary embolus (PE) He is on low-dose Xarelto  for secondary prevention The patient is given verbal instruction to hold for 48 hours prior to his dental extraction  No orders of the defined types were placed in this encounter.   All questions were answered. The patient knows to call the clinic with any problems, questions or concerns. The total time spent in the appointment was 20 minutes encounter with patients including review of chart and various tests results, discussions about plan of care and coordination of care plan   Hicks Lonn, MD 10/19/2023 11:05 AM  INTERVAL HISTORY: Please see below for problem oriented charting. he returns for surveillance follow-up on maintenance chemotherapy for recurrent multiple myeloma He is doing well He continues to exercise and is losing weight No recent bleeding complications He is scheduled for dental extraction on January 29  REVIEW OF SYSTEMS:   Constitutional: Denies fevers, chills or abnormal weight loss Eyes: Denies blurriness of vision Ears, nose, mouth, throat, and face: Denies mucositis or sore throat Respiratory: Denies cough, dyspnea or wheezes Cardiovascular: Denies palpitation, chest discomfort or lower extremity swelling Gastrointestinal:  Denies nausea, heartburn or change in bowel habits Skin: Denies abnormal skin  rashes Lymphatics: Denies new lymphadenopathy or easy bruising Neurological:Denies numbness, tingling or new weaknesses Behavioral/Psych: Mood is stable, no new changes  All other systems were reviewed with the patient and are negative.  I have reviewed the past medical history, past surgical history, social history and family history with the patient and they are unchanged from previous note.  ALLERGIES:  has no known allergies.  MEDICATIONS:  Current Outpatient Medications  Medication Sig Dispense Refill   acyclovir  (ZOVIRAX ) 400 MG tablet TAKE 1 TABLET(400 MG) BY MOUTH DAILY 90 tablet 1   atorvastatin  (LIPITOR) 20 MG tablet TAKE 1 TABLET(20 MG) BY MOUTH DAILY 90 tablet 1   calcium  carbonate (TUMS - DOSED IN MG ELEMENTAL CALCIUM ) 500 MG chewable tablet Chew 1 tablet by mouth 2 (two) times daily.     Cholecalciferol  (VITAMIN D ) 50 MCG (2000 UT) tablet Take 2,000 Units by mouth daily.     diclofenac  Sodium (VOLTAREN ) 1 % GEL Apply 2 g topically 4 (four) times daily. Apply to knee 50 g 2   empagliflozin  (JARDIANCE ) 10 MG TABS tablet Take 1 tablet (10 mg total) by mouth daily. 30 tablet 3   folic acid  (FOLVITE ) 400 MCG tablet Take 400 mcg by mouth daily.     furosemide  (LASIX ) 40 MG tablet Take 1 tablet (40 mg total) by mouth 2 (two) times daily. 60 tablet 3   ixazomib citrate  (NINLARO ) 3 MG capsule Take 1 capsule (3 mg) by mouth weekly, 3 weeks on, 1 week off, repeat every 4 weeks. Take on an empty stomach 1hr before or 2hr after meals. 3 capsule 11   losartan  (COZAAR ) 100 MG tablet Take 1 tablet (100 mg total) by mouth daily. 30 tablet 11  Multiple Vitamin (MULTI-VITAMIN) tablet Take 1 tablet by mouth daily.     oxyCODONE  (OXY IR/ROXICODONE ) 5 MG immediate release tablet Take 1 tablet (5 mg total) by mouth every 4 (four) hours as needed for severe pain (pain score 7-10). 60 tablet 0   Polyethyl Glycol-Propyl Glycol (SYSTANE OP) Place 1 drop into both eyes daily as needed (dry eyes).      pomalidomide  (POMALYST ) 3 MG capsule Take 1 capsule (3mg ) by mouth daily for 21 days, rest 7 days, repeat every 28 days. 21 capsule 0   primidone  (MYSOLINE ) 50 MG tablet TAKE 1 TABLET(50 MG) BY MOUTH AT BEDTIME 30 tablet 2   prochlorperazine  (COMPAZINE ) 10 MG tablet TAKE 1 TABLET(10 MG) BY MOUTH EVERY 6 HOURS AS NEEDED FOR NAUSEA OR VOMITING 60 tablet 1   rivaroxaban  (XARELTO ) 10 MG TABS tablet Take 1 tablet (10 mg total) by mouth daily. 90 tablet 1   vitamin B-12 (CYANOCOBALAMIN ) 1000 MCG tablet Take 1,000 mcg by mouth daily.     No current facility-administered medications for this visit.    SUMMARY OF ONCOLOGIC HISTORY: Oncology History  Multiple myeloma without remission (HCC)  03/20/2019 Imaging   1. Tumor involving the L5 and S1 and S2 segments of the spine as described with mass effects upon the left L4, L5, S1 and more distal left sacral nerves as described above. 2. Does the patient have a history of malignancy? 3. Multiple plasmacytomas and metastatic disease could give this appearance   03/27/2019 Pathology Results   Soft Tissue Needle Core Biopsy, sacral mass - PLASMABLASTIC NEOPLASM. - SEE COMMENT. Microscopic Comment The sections show needle core biopsy fragments of soft tissue densely infiltrated by a relatively monomorphic infiltrate of atypical plasmacytoid cells characterized by vesicular or partially clumped chromatin and prominent nucleoli. This is associated with scattered mitosis. A battery of immunohistochemical stains was performed and show that the atypical plasmacytoid cells are positive for CD138, CD43 and cytoplasmic kappa. There is also weak positivity for CD56 and partial variable positivity for cyclin D1. The atypical plasmacytoid cells are negative for cytoplasmic lambda, CD10, PAX5, CD79a, CD20, CD3, CD5, CD34, EBV (ISH) and mostly negative for LCA. The overall morphologic and histologic features are most compatible with a plasmablastic neoplasm. The differential  diagnosis includes plasmablastic lymphoma and plasmablastic plasmacytoma/myeloma. Based on the overall phenotypic features and the clinical setting, plasmacytoma/myeloma is favored. Clinical correlation and hematologic evaluation is recommended   03/27/2019 Procedure   Technically successful CT-guided biopsy of sacral soft tissue mass.   04/04/2019 Initial Diagnosis   Multiple myeloma without remission (HCC)   04/11/2019 PET scan   IMPRESSION: 1. Previously noted expansile lesions involving the L5 vertebra and sacrum are again noted and exhibit intense FDG uptake compatible with metabolically active disease. A third focus of increased uptake without corresponding CT abnormality is noted within the intertrochanteric portions of the proximal right femur. 2. Small lucent lesion within the T3 vertebra is noted without corresponding FDG uptake. 3. Asymmetric left bladder wall thickening, etiology indeterminate. 4. Aortic Atherosclerosis (ICD10-I70.0). Lad coronary artery calcification.   04/12/2019 Cancer Staging   Staging form: Plasma Cell Myeloma and Plasma Cell Disorders, AJCC 8th Edition - Clinical stage from 04/12/2019: Beta-2 -microglobulin (mg/L): 2, Albumin (g/dL): 3.6, ISS: Stage I, High-risk cytogenetics: Unknown, LDH: Unknown - Signed by Lonn Hicks, MD on 04/12/2019   04/14/2019 Bone Marrow Biopsy   Bone Marrow, Aspirate,Biopsy, and Clot, right iliac bone - PLASMA CELL MYELOMA.   04/17/2019 - 07/11/2019 Chemotherapy   The patient  had bortezemib, revlimid  and dexamethasone  for chemotherapy treatment.     08/17/2019 Bone Marrow Transplant   He received high dose melphalan  followed by autologous stem cell transplant at [facility]   11/22/2019 Bone Marrow Biopsy   BONE MARROW biopsy at [facility]:       Mild monoclonal plasmacytosis (approximately 2%), kappa light chain restricted.    11/22/2019 PET scan   PET CT at [facility] 1.  Similar size and appearance of mildly hypermetabolic lytic  lesion in the L5 vertebral body and posterior elements.  2.  Otherwise, no substantial FDG uptake compared to background bone marrow in the additional osseous lucent lesions.    12/20/2019 - 05/09/2020 Radiation Therapy   Radiation Treatment Dates: 12/20/2019 through 01/08/2020 Site Technique Total Dose (Gy) Dose per Fx (Gy) Completed Fx Beam Energies  Lumbar Spine: Spine 3D 35/35 2.5 14/14 15X        02/29/2020 PET scan   1. Similar size and appearance of expansile lytic lesion in the L5 vertebral body and posterior elements with minimal FDG activity.  2. Otherwise, no additional osseous lucent lesions with FDG uptake above background bone marrow   05/31/2020 -  Chemotherapy   The patient had Revlimid  for chemotherapy treatment.     11/25/2021 - 05/11/2022 Chemotherapy   Patient is on Treatment Plan : MYELOMA RELAPSED / REFRACTORY Daratumumab  SQ + Bortezomib  + Dexamethasone  (DaraVd) q21d / Daratumumab  SQ q28d      11/25/2021 - 10/29/2022 Chemotherapy   Patient is on Treatment Plan : MYELOMA RELAPSED / REFRACTORY Daratumumab  SQ + Bortezomib  + Dexamethasone  (DaraVd) q21d / Daratumumab  SQ q28d        PHYSICAL EXAMINATION: ECOG PERFORMANCE STATUS: 0 - Asymptomatic  Vitals:   10/19/23 1056  BP: 134/64  Pulse: 65  Resp: 18  Temp: 99.4 F (37.4 C)  SpO2: 95%   Filed Weights   10/19/23 1056  Weight: 234 lb 9.6 oz (106.4 kg)    GENERAL:alert, no distress and comfortable  LABORATORY DATA:  I have reviewed the data as listed    Component Value Date/Time   NA 141 08/27/2023 1110   NA 139 06/01/2023 1115   NA 139 04/05/2014 0849   K 3.9 08/27/2023 1110   K 4.4 04/05/2014 0849   CL 105 08/27/2023 1110   CO2 31 08/27/2023 1110   CO2 23 04/05/2014 0849   GLUCOSE 82 08/27/2023 1110   GLUCOSE 109 04/05/2014 0849   BUN 16 08/27/2023 1110   BUN 26 06/01/2023 1115   BUN 16.8 04/05/2014 0849   CREATININE 0.85 08/27/2023 1110   CREATININE 0.80 10/29/2022 1046   CREATININE 0.91 07/29/2017  1004   CREATININE 0.9 04/05/2014 0849   CALCIUM  9.3 08/27/2023 1110   CALCIUM  8.7 04/05/2014 0849   PROT 6.7 08/27/2023 1110   PROT 6.3 06/01/2023 1115   PROT 6.7 04/05/2014 0849   ALBUMIN 3.8 08/27/2023 1110   ALBUMIN 4.1 06/01/2023 1115   ALBUMIN 3.8 04/05/2014 0849   AST 21 08/27/2023 1110   AST 24 10/29/2022 1046   AST 26 04/05/2014 0849   ALT 21 08/27/2023 1110   ALT 31 10/29/2022 1046   ALT 30 04/05/2014 0849   ALKPHOS 54 08/27/2023 1110   ALKPHOS 75 04/05/2014 0849   BILITOT 1.2 (H) 08/27/2023 1110   BILITOT 0.9 06/01/2023 1115   BILITOT 0.9 10/29/2022 1046   BILITOT 0.71 04/05/2014 0849   GFRNONAA >60 08/27/2023 1110   GFRNONAA >60 10/29/2022 1046   GFRAA >60 06/14/2020  0830    No results found for: SPEP, UPEP  Lab Results  Component Value Date   WBC 5.0 10/19/2023   NEUTROABS 1.9 10/19/2023   HGB 15.0 10/19/2023   HCT 44.0 10/19/2023   MCV 99.1 10/19/2023   PLT 251 10/19/2023      Chemistry      Component Value Date/Time   NA 141 08/27/2023 1110   NA 139 06/01/2023 1115   NA 139 04/05/2014 0849   K 3.9 08/27/2023 1110   K 4.4 04/05/2014 0849   CL 105 08/27/2023 1110   CO2 31 08/27/2023 1110   CO2 23 04/05/2014 0849   BUN 16 08/27/2023 1110   BUN 26 06/01/2023 1115   BUN 16.8 04/05/2014 0849   CREATININE 0.85 08/27/2023 1110   CREATININE 0.80 10/29/2022 1046   CREATININE 0.91 07/29/2017 1004   CREATININE 0.9 04/05/2014 0849   GLU 10 04/10/2014 0000      Component Value Date/Time   CALCIUM  9.3 08/27/2023 1110   CALCIUM  8.7 04/05/2014 0849   ALKPHOS 54 08/27/2023 1110   ALKPHOS 75 04/05/2014 0849   AST 21 08/27/2023 1110   AST 24 10/29/2022 1046   AST 26 04/05/2014 0849   ALT 21 08/27/2023 1110   ALT 31 10/29/2022 1046   ALT 30 04/05/2014 0849   BILITOT 1.2 (H) 08/27/2023 1110   BILITOT 0.9 06/01/2023 1115   BILITOT 0.9 10/29/2022 1046   BILITOT 0.71 04/05/2014 0849

## 2023-10-19 NOTE — Therapy (Signed)
 OUTPATIENT PHYSICAL THERAPY TREATMENT/RECERTIFICATION      Patient Name: Jesse Mcclure. MRN: [id number] DOB:1951-01-03, 73 y.o., adult Today's Date: 10/19/2023   PCP: Joyce Rush MD REFERRING PROVIDER: Lonn Hicks MD  END OF SESSION:  PT End of Session - 10/19/23 1528     Visit Number 22    Date for PT Re-Evaluation 11/16/23    Authorization Type Medicare    Progress Note Due on Visit 30    PT Start Time 1530    PT Stop Time 1612    PT Time Calculation (min) 42 min    Activity Tolerance Patient tolerated treatment well                 Past Medical History:  Diagnosis Date   BPH (benign prostatic hypertrophy)    Degenerative disc disease    Diverticulosis    Hemochromatosis    HTN (hypertension)    Hyperlipidemia    Multiple myeloma (HCC)    Polio    Status post Nissen fundoplication (without gastrostomy tube) procedure    for hiatal hernia.   Past Surgical History:  Procedure Laterality Date   FOOT SURGERY     HIATAL HERNIA REPAIR     OTHER SURGICAL HISTORY     Cataract Surgery--Both eyes   SKIN BIOPSY Right 04/13/2022   squamous cell carcinoma in stiu arising in actinic keratosis   TOTAL KNEE ARTHROPLASTY Left 07/15/2021   Procedure: TOTAL KNEE ARTHROPLASTY;  Surgeon: Josefina Chew, MD;  Location: [facility];  Service: Orthopedics;  Laterality: Left;   Undescended testes Right 10/05/1966   Patient Active Problem List   Diagnosis Date Noted   Bone loss of mandible 08/27/2023   Snoring 06/08/2023   Chronic midline low back pain without sciatica 06/01/2023   Idiopathic gout 06/01/2023   (HFpEF) heart failure with preserved ejection fraction (HCC) 05/17/2023   Shortness of breath 05/16/2023   Essential tremor 05/11/2023   Balance disorder 04/23/2023   Squamous cell carcinoma in situ 04/14/2023   Melanoma in situ (HCC) 04/14/2023   History of pulmonary embolus (PE) 09/04/2022   History of basal cell cancer 05/01/2022   First degree AV block  04/22/2022   History of skin cancer 04/21/2022   S/P TKR (total knee replacement), left 07/15/2021   Osteoarthritis of left knee 05/22/2021   Deficiency anemia 01/07/2021   ACE-inhibitor cough 12/16/2020   Tremor of both hands 10/11/2019   Peripheral neuropathy due to chemotherapy (HCC) 10/11/2019   Dry skin 10/11/2019   Insomnia disorder 09/06/2019   Loose stools 09/06/2019   S/P bone marrow transplant (HCC) 08/30/2019   Hypocalcemia 06/14/2019   Multiple myeloma without remission (HCC) 04/04/2019   Other constipation 04/04/2019   Glucose intolerance (impaired glucose tolerance) 05/20/2016   Vitamin D  deficiency 05/20/2016   Chronic musculoskeletal pain 01/09/2016   History of orchiectomy, unilateral 05/20/2015   Obesity (BMI 30-39.9) 05/20/2015   Elevated PSA 05/20/2015   Arthritis 05/20/2015   Essential hypertension 03/02/2012   Hyperlipidemia 03/02/2012   Hemochromatosis, hereditary (HCC) 11/28/2008    ONSET DATE: > 1 year  REFERRING DIAG: G25.2 intention tremor; R26.89 balance disorder  THERAPY DIAG: difficulty in walking; fall risk Muscle weakness (generalized)  Difficulty in walking, not elsewhere classified  Balance disorder  Rationale for Evaluation and Treatment: Rehabilitation  SUBJECTIVE:  SUBJECTIVE STATEMENT: I'm doing OK.  Some upper respiratory stuff came back that's why I missed last time.  Carrying birdseed uphill     has handicapped parking Eval: Being treated for cancer 4-5 years including oral chemo at home;  Dr wants me to walk and I use poles b/c I'm very wobbly.  Dr. says it's not the medication causing this.  Went on cruise in [place] a few months ago but I couldn't tour and stayed on the boat. No falls but several close calls: 3-4 steps garage steps into the  house, stumbled going up.  If I sit a while then get up and take a few steps, will feel wobbly.  Hold onto something to pick up something from the floor. Denies loss of sensation in feet. I sleep a lot.  I'm very sedentary.   My partner thinks I need to move more.  I ride the stationary bike Tuesday/Thursday occasionally.  I used to have a trainer prior to getting sick. I get wheelchair service at airports. [place] trip in May  Considering [facility]  [facility] [employer]  PERTINENT HISTORY:  Oncology History  Multiple myeloma without remission (HCC)  03/20/2019 Imaging    1. Tumor involving the L5 and S1 and S2 segments of the spine as described with mass effects upon the left L4, L5, S1 and more distal left sacral nerves   Left TKR HTN Back pain  PAIN:  Are you having pain? Yes  NPRS scale:  5/10 Pain location: LBP   Aggravating factors: standing in the kitchen Relieving factors: ibuprofen , oxy if it gets bad   PRECAUTIONS: Fall    WEIGHT BEARING RESTRICTIONS: No  FALLS: Has patient fallen in last 6 months? No  LIVING ENVIRONMENT: Lives with: partner Lives in: House/apartment 2 story main living downstairs Stairs: Yes: Internal: 10 steps; on right going up and External: 3 steps; on left going up Has following equipment at home: walking poles   PLOF: partner does cooking, pt does some grilling Enjoys being with friends, hanging out; doesn't go to social events  b/c I can't stand for long time 10 minutes  PATIENT GOALS: I want to walk better, stand longer; going to [place] in May for 11 days; ride bicycles there;  I'd like to not have to use wheelchair service at the airport  The Patient-Specific Functional Scale  Initial:  I am going to ask you to identify up to 3 important activities that you are unable to do or are having difficulty with as a result of this problem.  Today are there any activities that you are unable to do or having difficulty with because of this?  (Patient  shown scale and patient rated each activity)  Follow up: When you first came in you had difficulty performing these activities.  Today do you still have difficulty?  Patient-Specific activity scoring scheme (Point to one number):  0 1 2 3 4 5 6 7 8 9  10 Unable  Able to perform To perform                                                                                                    activity at the same Activity         Level as before                                                                                                                       Injury or problem  Activity     Standing for parties                                    Initial:       4               11/14:   8/10 much improved! 2.        Going to [employer] show                              Initial:    3                  11/14:  8/10 improved!    OBJECTIVE:    COGNITION: Overall cognitive status: Within functional limits for tasks assessed    LOWER EXTREMITY ROM:   grossly WFLs  LOWER EXTREMITY MMT:    MMT Right Eval Right  07/20/23 Left Eval Left  07/20/23  Hip flexion  4  4  Hip extension      Hip abduction 4 4+ 4 4+  Hip adduction      Hip internal rotation      Hip external rotation      Knee flexion      Knee extension 4 4 4 4   Ankle dorsiflexion 4 4 4 4   Ankle plantarflexion 4 4 4 4   Ankle inversion      Ankle eversion      (Blank rows = not tested)           TRUNK STRENGTH:  Decreased activation of transverse abdominus muscles; abdominals 4-/5; decreased activation of lumbar multifidi; trunk extensors 4-/5 07/20/23:  Improved to 4/5                                  GAIT: Comments: wider base of support; decreased step length; decreased dorsiflexion  FUNCTIONAL TESTS:  5x  sit to stand: 20.31 no hands TUG 12.11 sec no hands 2 min walk test 366 feet RPE 7/10 BERG balance test:  43/56  indicating a 50% risk of falls; most challenged by single limb standing or narrow base of support; decreased speed of movement, limited forward reach  9/19: 5 sit to stand 12.33 no hands TUG 11.61 no hands BERG: 49/56 Dynamic Gait Index: 18/24 difficulty with head turns and head extension  10/15: 5 sit to stand 12.98 no hands TUG 7.53 sec no hands BERG: 49/56 2 min walk test: 420.7 feet with RPE of 7/10  11/14: 12.72 5x STS TUG 8.37 6 MWT 866 1 rest break RPE 5/10 O2 95% HR85   12/12: 5x STS 12.97 no hand Tug 7.92 6 MWT 981 RPE 7/10  1/14 1018 no rest breaks RPE 7/10  MINI-BESTest: Balance Evaluation Systems Test ANTICIPATORY: SIT TO STAND: (2) normal without use of hands and stabilizes independently     (1) Moderate comes to stand WITH use of hands on 1st attempt    (0) Severe: unable to stand up from chair without assistance OR needs several attempts with use of   hands Score:2  2. RISE TO TOES: feet shoulder width apart. Hands on hips.  Rise as high as you can onto your toes. Try to hold this pose for 3 sec                          (2) stable for 3s with maximum height    (1) heels up, but not full range (smaller than when holding hands) OR noticeable instability for 3 sec                           (0) < or equat to 3 s  Score: 1   12/12  2    1/14 : 2  3. STAND ON ONE LEG: look straight ahead. Hands on hips. Lift your leg off the ground.     Left:  trial 1 __2__ trial 2__2__                                    Right: Trial 1:___2__   Trial 2:___2____   (2) 20 s       (2)  20 s                (1) < 20      (1) < 20 s   (0) Unable      (0) unable Score (use the worst side): 1   12/12 1    1/14: 1  REACTIVE POSTURAL CONTROL 4. COMPENSATORY STEPPING CORRECTION FORWARD stand with feet shoulder width apart, arms at your sides. Lean forward against my hands beyond your forward limits.  When I let go, do whatever is necessary, including taking a step, to avoid a fall   (2)  recovers independently  with a single, large step, to avoid a fall   (1) more than one step used to recover equilibrium   (0) No step OR would fall if not caught Score: 1  5. COMPENSATORY STEPPING CORRECTION BACKWARD: Lean backward against my hands beyond your backward limits.  When I let go, do whatever is necessary, including a step to avoid a fall. (2) recovers independently  with a single, large step, to avoid a fall   (  1) more than one step used to recover equilibrium   (0) No step OR would fall if not caught Score: 1  6. COMPENSATORY STEPPING LATERAL: Lean into my hand beyond your sideways limit.  When I let go, do whatever is necessary, including taking a step, to avoid a fall. Left           Right   (2) recovers independently with 1 step (crossover or lateral OK)   (1) several steps to recover equilibrium   (0) falls or cannot step Score: 2 SENSORY ORIENTATION 7. STANCE FEET TOGETHER EYES OPEN  firm surface Be as stable and still as possible until I say stop   (2) 30s   (1) < 30 s   (0) unable Score: 2  8. STANCE ON FOAM EYES CLOSED feet together, eyes closed, hands on hips. Be as stable and still as you can until I stay stop.  I will start timing when you close your eyes. Time in seconds:      (2) 30s   (1) <30 s   (0) unable Score: 1     9. INCLINE EYES CLOSED: Stand on incline with feet shoulder width apart, arms down at your sides.  I will start timing when you close your eyes   (2) Stands 30 s and aligns with gravity   (1) stands < 30 s OR aligns with surface   (0) unable Score: 2  DYNAMIC GAIT 10. CHANGE IN GAIT SPEED: begin walking at your normal speed, when I tell you fast, walk as fast as you can, when I say slow walk very slowly   (2) significantly changes walking speed without imbalance   (1) unable to change walking speed or signs of imbalance   (0) unable to achieve significant change in walking speed AND signes of imbalance Score: 2  11. WALK WITH  HEAD TURNS HORIZONTAL: begin walking at normal speed, when I say right, turn your head to the right, when I say left turn your head to the left.  Try to keep yourself walking in a straight line   (2) performs head turns with no change in gait speed and good balance   (1) performs head turns with reduction in gait speed   (0)  performs head turns with imbalance Score: 2  12. WALK WITH PIVOT TURNS: begin walking at your normal speed.  When I tell you to turn and stop, turn as quickly as you can, face the opposite direction and stop.  After the turn, your feet should be close together   (2) turns with feet close FAST in 3 steps with good balance   (1) turns with feet close SLOW > 4 steps with good balance   (0) cannot turn with feet close at any speed without imbalance Score: 2  13. STEP OVER OBSTACLES:  ( 9 inch height 10 feet away from subject) begin walking at your normal speed.  When you get to the box, step over it, not around it and keep walking   (2) Able to step over box with minimal change of gait speed and good balance   (1) Steps over box but touches box OR displays cautious behavior by slowing gait   (0) Unable to step over box OR steps around box Score: 1  14. TIMED UP AND GO NORMAL AND WITH DUAL TASK: 3 METER WALK: When I say go walk at your your normal speed across the tape, turn around and come back to sit  in the chair   Time: Count backwards by 3s starting at    20.  When I say go, stand up from the chair, walk at your normal speed across the tape on the floor, turn around, come back to sit in the chair.  Continue counting backwards the entire time.  Time:   (2) No noticeable change in sitting, standing or walking while backward counting when compared to TUG without dual task   (1) Dual task affects either counting OR walking (>10%)    (0) stops counting while walking OR stops walking while counting If gait speed slows more than 10% between TUG without and with dual task, the  score should be decreased by a point Score:  1  Total score:         19  /28 12/12:  21/28 1/14:  23/28       TODAY'S TREATMENT:       DATE:   10/19/2023 NuStep L 3 x 5 min (blue machine)-PT present to discuss progress Standing lat bar 35# 10x 2 MiniBESTtest 6 MWT Info on Right Start 9 week program at [employer]  10/12/2023 NuStep L 3 x 5 min (blue machine)-PT present to discuss progress Standing lat bar 35# 10x 2 Seated trunk rotation 4# 30 sec Sit ups with 4# with press overhead 20x  Resisted walk: 15# backward only 10x Seated dead lifts 10# 15x Leg press: reclined back seat 9 80#  bil 15x2;  40# single leg right/left  15x  each side Walking 2 min (moderate short of breath)  12/12: NuStep L 3 x 5 min (blue machine)-PT present to discuss progress Standing lat bar 35# 10x 2 Sit ups with 10# kettlebell press overhead 12x  Resisted walk: 15# backward only 10x Leg press: reclined back (worked well) seat 9 80#  bil 15x2; 40# single leg right/left  15x  each side 5x STS TUG 6 MWT MiniBESTtest 12/5: NuStep L 3 x 5 min (blue machine)-PT present to discuss progress Standing lat bar 35# 10x 2 Seated lumbar stretch 3 way blue ball rolls 15x Seated red power cord trunk extension 20x Up and down steps 2 railings 4x Chair sit ups 10# kettlebell 15x 6 inch step taps holding 10# kettlebell 2 sets of 10x with light UE support walk 2 min some increase in back pain and shortness of breath/fatigue 11/21: NuStep L 5 x 5 min (green machine)-PT present to discuss progress Standing lat bar 35# 15x 2 Seated lumbar stretch 3 way blue ball rolls 15x Resisted cable walk 15# 10x Chair sit ups 10# kettlebell 15x Standing cable rows 15# 15x 6 inch step taps 20x with light UE support 6 inch step up 10x right/left with light bil railing support Resisted cable walk 15# 10x walk 2 min some increase in back pain and shortness of breath/fatigue    PATIENT EDUCATION: Education details:  educated on slow long breaths in through nose and long slow breaths out through pursed lips to avoid hyperventilation.  Person educated: Patient Education method: Explanation Education comprehension: verbalized understanding    GOALS: Goals reviewed with patient? Yes  SHORT TERM GOALS: Target date: 05/27/2023   The patient will demonstrate knowledge of basic self care strategies and exercises to promote strength and mobility  Baseline: Goal status: met 9/19  2.  Able to walk 2 min 375 feet with RPE 5/10 Baseline:  Goal status: partially met RPE 7/10 today but much improved distance  3.  The patient will have an improved  BERG balance score to   46 /56 indicating reduced risk of falls Baseline:  Goal status: met 9/19  4.  Improved LE strength and balance  indicated by improved 5x STS to 16 sec Baseline:  Goal status: met 9/19  5.  Set DGI for MiniBESTest goal Baseline:  Goal status: met 9/19    LONG TERM GOALS: Target date: 10/14/2023   The patient will be independent in a safe self progression of a home exercise program and transition to gym program to promote further recovery of function  Baseline:  Goal status: revised  2.  The patient will have improved gait stamina and speed needed to ambulate 600 feet in 6 minutes with RPE of 6/10 Baseline:  Goal status: =met 11/14  3.  The patient will have an improved BERG balance score to  48  /56 indicating reduced risk of falls  Baseline:  Goal status: met 9/16  4.  The patient will have improved Timed Up and Go (TUG) time to    <12   sec indicating improved gait speed and LE strength  Baseline:  Goal status: met 9/19  5.  The patient will have improved hip and knee strength to at least 4+/5 needed for standing >10 min for washing dishes Baseline:  Goal status: ongoing  6. Improved balance with DGI to 21/24 indicating decreased risk of falls Met 1/14  7. Crowds at [facility] and walk to car/parking lot with PSFS to  5 Met 11/14 8. Standing for 3 minutes for parties for socializing with PSFS of 6 Met 11/14  9. Mini best test improved to 23/28 indicating improved balance and dec risk of falls Met 1/14  10.  The patient will have improved gait stamina and speed needed to ambulate 950 feet in 6 minutes Met 12/12     ASSESSMENT:  CLINICAL IMPRESSION: The patient would benefit from a continuation of skilled PT to finalize HEP and help with transition to community based exercise.    Improvement in lateral reactionary balance but is challenged by eyes close and single leg balance.  Much improved gait speed with 6 MWT with no rest break needed today.  Good progress with rehab goals.  Anticipate he will meet the remaining goals in 3-4 visits.      OBJECTIVE IMPAIRMENTS: cardiopulmonary status limiting activity, decreased activity tolerance, decreased balance, decreased endurance, decreased mobility, difficulty walking, decreased strength, impaired perceived functional ability, and pain.   ACTIVITY LIMITATIONS: carrying, lifting, bending, standing, squatting, and locomotion level  PARTICIPATION LIMITATIONS: meal prep, cleaning, interpersonal relationship, shopping, and community activity  PERSONAL FACTORS: Fitness, Time since onset of injury/illness/exacerbation, and 3+ comorbidities: multiple myeloma, HTN, DDD lumbar spine  are also affecting patient's functional outcome.   REHAB POTENTIAL: Good  CLINICAL DECISION MAKING: Evolving/moderate complexity  EVALUATION COMPLEXITY: Moderate  PLAN:  PT FREQUENCY: 2x/week  PT DURATION: 8 weeks  PLANNED INTERVENTIONS: Therapeutic exercises, Therapeutic activity, Neuromuscular re-education, Balance training, Gait training, Patient/Family education, Self Care, Stair training, Aquatic Therapy, Cryotherapy, Moist heat, Manual therapy, and Re-evaluation  PLAN FOR NEXT SESSION: farmer's carries;  finalize HEP;  balance soft surface/ [employer]; eyes closed; KX;  leg  press recline seat:  dual task walking, reactionary balance, eyes closed and foam balance; give [facility] Start program;  resisted backward walk; general strengthening; gradually increase time on his feet; step over obstacles for toe clearance/balance  Glade Pesa, PT 10/19/23 5:32 PM Phone: [phone] Fax: [phone]

## 2023-10-19 NOTE — Assessment & Plan Note (Signed)
 He is on low-dose Xarelto for secondary prevention The patient is given verbal instruction to hold for 48 hours prior to his dental extraction

## 2023-10-20 ENCOUNTER — Other Ambulatory Visit (HOSPITAL_COMMUNITY): Payer: Medicare Other

## 2023-10-20 ENCOUNTER — Other Ambulatory Visit (HOSPITAL_COMMUNITY): Payer: Self-pay

## 2023-10-20 ENCOUNTER — Telehealth: Payer: Self-pay | Admitting: *Deleted

## 2023-10-20 ENCOUNTER — Telehealth: Payer: Self-pay | Admitting: Pharmacy Technician

## 2023-10-20 ENCOUNTER — Ambulatory Visit (HOSPITAL_COMMUNITY)
Admission: RE | Admit: 2023-10-20 | Discharge: 2023-10-20 | Disposition: A | Discharge status: Home / Self Care | Payer: Medicare Other | Source: Ambulatory Visit | Attending: Internal Medicine | Admitting: Internal Medicine

## 2023-10-20 DIAGNOSIS — C9000 Multiple myeloma not having achieved remission: Secondary | ICD-10-CM | POA: Diagnosis not present

## 2023-10-20 DIAGNOSIS — D492 Neoplasm of unspecified behavior of bone, soft tissue, and skin: Secondary | ICD-10-CM | POA: Diagnosis not present

## 2023-10-20 DIAGNOSIS — M48061 Spinal stenosis, lumbar region without neurogenic claudication: Secondary | ICD-10-CM | POA: Diagnosis not present

## 2023-10-20 DIAGNOSIS — M51369 Other intervertebral disc degeneration, lumbar region without mention of lumbar back pain or lower extremity pain: Secondary | ICD-10-CM | POA: Diagnosis not present

## 2023-10-20 DIAGNOSIS — M51379 Other intervertebral disc degeneration, lumbosacral region without mention of lumbar back pain or lower extremity pain: Secondary | ICD-10-CM | POA: Diagnosis not present

## 2023-10-20 LAB — KAPPA/LAMBDA LIGHT CHAINS
Kappa free light chain: 36.6 mg/L — ABNORMAL HIGH (ref 3.3–19.4)
Kappa, lambda light chain ratio: 1.96 — ABNORMAL HIGH (ref 0.26–1.65)
Lambda free light chains: 18.7 mg/L (ref 5.7–26.3)

## 2023-10-20 MED ORDER — GADOBUTROL 1 MMOL/ML IV SOLN
10.0000 mL | Freq: Once | INTRAVENOUS | Status: AC | PRN
  Administered 2023-10-20: 10 mL via INTRAVENOUS

## 2023-10-20 NOTE — Telephone Encounter (Signed)
 Oral Oncology Patient Advocate Encounter  Was successful in securing patient a $12,000 grant from [facility] to provide copayment coverage for Ninlaro  & Pomalyst .  This will keep the out of pocket expense at $0.     [facility] ID: [id number]  I have spoken with the patient.   The billing information is as follows and has been shared with [facility].    RxBin: [id number] PCN: [id number] Member ID: [id number] Group ID: [id number] Dates of Eligibility: 10/12/23 through 10/10/24  Fund:  MM  Paulette Borrow, CPhT-Adv Oncology Pharmacy Patient Advocate [facility] Direct Number: [phone]  Fax: [phone]

## 2023-10-20 NOTE — Telephone Encounter (Signed)
 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 He's recently changed pharmacy for pomalyst . Patient requested assistance.  Contacted Paulette Borrow, CPhT with [facility]. She stated she would contact [facility] and provide information regarding patient's grant and contact patient with this information. Patient verbalized understanding.

## 2023-10-21 ENCOUNTER — Encounter: Payer: Medicare Other | Admitting: Physical Therapy

## 2023-10-25 ENCOUNTER — Inpatient Hospital Stay: Payer: Medicare Other

## 2023-10-25 ENCOUNTER — Inpatient Hospital Stay (HOSPITAL_BASED_OUTPATIENT_CLINIC_OR_DEPARTMENT_OTHER): Payer: Medicare Other | Admitting: Internal Medicine

## 2023-10-25 VITALS — BP 137/79 | HR 72 | Temp 97.7°F | Resp 15 | Ht 69.0 in | Wt 236.3 lb

## 2023-10-25 DIAGNOSIS — Z86711 Personal history of pulmonary embolism: Secondary | ICD-10-CM | POA: Diagnosis not present

## 2023-10-25 DIAGNOSIS — G620 Drug-induced polyneuropathy: Secondary | ICD-10-CM

## 2023-10-25 DIAGNOSIS — T451X5A Adverse effect of antineoplastic and immunosuppressive drugs, initial encounter: Secondary | ICD-10-CM | POA: Diagnosis not present

## 2023-10-25 DIAGNOSIS — G250 Essential tremor: Secondary | ICD-10-CM

## 2023-10-25 DIAGNOSIS — C9000 Multiple myeloma not having achieved remission: Secondary | ICD-10-CM | POA: Diagnosis not present

## 2023-10-25 DIAGNOSIS — Z9221 Personal history of antineoplastic chemotherapy: Secondary | ICD-10-CM | POA: Diagnosis not present

## 2023-10-25 DIAGNOSIS — Z7901 Long term (current) use of anticoagulants: Secondary | ICD-10-CM | POA: Diagnosis not present

## 2023-10-25 NOTE — Progress Notes (Signed)
[facility] [address]  [place], [place] [address] [phone]   Interval Evaluation  Date of Service: 10/25/23 Patient Name: Jesse Mcclure. Patient MRN: [id number] Patient DOB: 1951-01-10 Provider: Henreitta Leber, MD  Identifying Statement:  Jesse [place]. is a 73 y.o. adult with Essential tremor  Peripheral neuropathy due to chemotherapy [facility])   Primary Cancer:  Oncologic History: Oncology History  Multiple myeloma without remission (HCC)  03/20/2019 Imaging   1. Tumor involving the L5 and S1 and S2 segments of the spine as described with mass effects upon the left L4, L5, S1 and more distal left sacral nerves as described above. 2. Does the patient have a history of malignancy? 3. Multiple plasmacytomas and metastatic disease could give this appearance   03/27/2019 Pathology Results   Soft Tissue Needle Core Biopsy, sacral mass - PLASMABLASTIC NEOPLASM. - SEE COMMENT. Microscopic Comment The sections show needle core biopsy fragments of soft tissue densely infiltrated by a relatively monomorphic infiltrate of atypical plasmacytoid cells characterized by vesicular or partially clumped chromatin and prominent nucleoli. This is associated with scattered mitosis. A battery of immunohistochemical stains was performed and show that the atypical plasmacytoid cells are positive for CD138, CD43 and cytoplasmic kappa. There is also weak positivity for CD56 and partial variable positivity for cyclin D1. The atypical plasmacytoid cells are negative for cytoplasmic lambda, CD10, PAX5, CD79a, CD20, CD3, CD5, CD34, EBV (ISH) and mostly negative for LCA. The overall morphologic and histologic features are most compatible with a plasmablastic neoplasm. The differential diagnosis includes plasmablastic lymphoma and plasmablastic plasmacytoma/myeloma. Based on the overall phenotypic features and the clinical setting, plasmacytoma/myeloma is  favored. Clinical correlation and hematologic evaluation is recommended   03/27/2019 Procedure   Technically successful CT-guided biopsy of sacral soft tissue mass.   04/04/2019 Initial Diagnosis   Multiple myeloma without remission (HCC)   04/11/2019 PET scan   IMPRESSION: 1. Previously noted expansile lesions involving the L5 vertebra and sacrum are again noted and exhibit intense FDG uptake compatible with metabolically active disease. A third focus of increased uptake without corresponding CT abnormality is noted within the intertrochanteric portions of the proximal right femur. 2. Small lucent lesion within the T3 vertebra is noted without corresponding FDG uptake. 3. Asymmetric left bladder wall thickening, etiology indeterminate. 4. Aortic Atherosclerosis (ICD10-I70.0). Lad coronary artery calcification.   04/12/2019 Cancer Staging   Staging form: Plasma Cell Myeloma and Plasma Cell Disorders, AJCC 8th Edition - Clinical stage from 04/12/2019: Beta-2-microglobulin (mg/L): 2, Albumin (g/dL): 3.6, ISS: Stage I, High-risk cytogenetics: Unknown, LDH: Unknown - Signed by Artis Delay, MD on 04/12/2019   04/14/2019 Bone Marrow Biopsy   Bone Marrow, Aspirate,Biopsy, and Clot, right iliac bone - PLASMA CELL MYELOMA.   04/17/2019 - 07/11/2019 Chemotherapy   The patient had bortezemib, revlimid and dexamethasone for chemotherapy treatment.     08/17/2019 Bone Marrow Transplant   He received high dose melphalan followed by autologous stem cell transplant at [facility]   11/22/2019 Bone Marrow Biopsy   BONE MARROW biopsy [facility]:       Mild monoclonal plasmacytosis (approximately 2%), kappa light chain restricted.    11/22/2019 PET scan   PET CT at [facility] 1.  Similar size and appearance of mildly hypermetabolic lytic lesion in the L5 vertebral body and posterior elements.  2.  Otherwise, no substantial FDG uptake compared to background bone marrow in the additional osseous lucent  lesions.  12/20/2019 - 05/09/2020 Radiation Therapy   Radiation Treatment Dates: 12/20/2019 through 01/08/2020 Site Technique Total Dose (Gy) Dose per Fx (Gy) Completed Fx Beam Energies  Lumbar Spine: Spine 3D 35/35 2.5 14/14 15X        02/29/2020 PET scan   1. Similar size and appearance of expansile lytic lesion in the L5 vertebral body and posterior elements with minimal FDG activity.  2. Otherwise, no additional osseous lucent lesions with FDG uptake above background bone marrow   05/31/2020 -  Chemotherapy   The patient had Revlimid for chemotherapy treatment.     11/25/2021 - 05/11/2022 Chemotherapy   Patient is on Treatment Plan : MYELOMA RELAPSED / REFRACTORY Daratumumab SQ + Bortezomib + Dexamethasone (DaraVd) q21d / Daratumumab SQ q28d      11/25/2021 - 10/29/2022 Chemotherapy   Patient is on Treatment Plan : MYELOMA RELAPSED / REFRACTORY Daratumumab SQ + Bortezomib + Dexamethasone (DaraVd) q21d / Daratumumab SQ q28d       CNS Oncologic History 12/18/19: Completes radiation therapy L5-S2 ([place])  Interval History: Jesse [place]. Presents today for follow up after recent MRI of the lumbar spine.  Tremor has been very mild and stable over the past  few months.  He is only occasionally dosing 25mg  primidone at night.  Back pain and neuropathy remain mild overall.  Walking has improved with PT since weight loss, diuresis from his admission.  H+P (05/11/23) Patient presents to review tremor, balance issues.  He describes 6 months history of tremor with activity.  It affects both hands, causes a "fast shake".  He has difficulty using a fork and other utensils, and is using a bib to eat.  Sometimes objects are difficult to grasp.  Sister has a similar tremor, parents died relatively young and did not have tremor.  No involvement of feet, head or voice.  It might improve with social drinking.  He also describes impairment in balance over the same time period.  He was limited in walking on  his most recent vacation, needed to stay on the cruise rather than go on outings.  No notable falls, still does walk independently around the home.  Recently started PT sessions.  Medications: Current Outpatient Medications on File Prior to Visit  Medication Sig Dispense Refill   acyclovir (ZOVIRAX) 400 MG tablet TAKE 1 TABLET(400 MG) BY MOUTH DAILY 90 tablet 1   atorvastatin (LIPITOR) 20 MG tablet TAKE 1 TABLET(20 MG) BY MOUTH DAILY 90 tablet 1   calcium carbonate (TUMS - DOSED IN MG ELEMENTAL CALCIUM) 500 MG chewable tablet Chew 1 tablet by mouth 2 (two) times daily.     Cholecalciferol (VITAMIN D) 50 MCG (2000 UT) tablet Take 2,000 Units by mouth daily.     diclofenac Sodium (VOLTAREN) 1 % GEL Apply 2 g topically 4 (four) times daily. Apply to knee 50 g 2   empagliflozin (JARDIANCE) 10 MG TABS tablet Take 1 tablet (10 mg total) by mouth daily. 30 tablet 3   folic acid (FOLVITE) 400 MCG tablet Take 400 mcg by mouth daily.     furosemide (LASIX) 40 MG tablet Take 1 tablet (40 mg total) by mouth 2 (two) times daily. 60 tablet 3   ixazomib citrate (NINLARO) 3 MG capsule Take 1 capsule (3 mg) by mouth weekly, 3 weeks on, 1 week off, repeat every 4 weeks. Take on an empty stomach 1hr before or 2hr after meals. 3 capsule 11   losartan (COZAAR) 100 MG tablet Take 1 tablet (100  mg total) by mouth daily. 30 tablet 11   Multiple Vitamin (MULTI-VITAMIN) tablet Take 1 tablet by mouth daily.     oxyCODONE (OXY IR/ROXICODONE) 5 MG immediate release tablet Take 1 tablet (5 mg total) by mouth every 4 (four) hours as needed for severe pain (pain score 7-10). 60 tablet 0   Polyethyl Glycol-Propyl Glycol (SYSTANE OP) Place 1 drop into both eyes daily as needed (dry eyes).     pomalidomide (POMALYST) 3 MG capsule Take 1 capsule (3mg ) by mouth daily for 21 days, rest 7 days, repeat every 28 days. 21 capsule 0   primidone (MYSOLINE) 50 MG tablet TAKE 1 TABLET(50 MG) BY MOUTH AT BEDTIME 30 tablet 2   prochlorperazine  (COMPAZINE) 10 MG tablet TAKE 1 TABLET(10 MG) BY MOUTH EVERY 6 HOURS AS NEEDED FOR NAUSEA OR VOMITING 60 tablet 1   rivaroxaban (XARELTO) 10 MG TABS tablet Take 1 tablet (10 mg total) by mouth daily. 90 tablet 1   vitamin B-12 (CYANOCOBALAMIN) 1000 MCG tablet Take 1,000 mcg by mouth daily.     No current facility-administered medications on file prior to visit.    Allergies: No Known Allergies Past Medical History:  Past Medical History:  Diagnosis Date   BPH (benign prostatic hypertrophy)    Degenerative disc disease    Diverticulosis    Hemochromatosis    HTN (hypertension)    Hyperlipidemia    Multiple myeloma (HCC)    Polio    Status post Nissen fundoplication (without gastrostomy tube) procedure    for hiatal hernia.   Past Surgical History:  Past Surgical History:  Procedure Laterality Date   FOOT SURGERY     HIATAL HERNIA REPAIR     OTHER SURGICAL HISTORY     Cataract Surgery--Both eyes   SKIN BIOPSY Right 04/13/2022   squamous cell carcinoma in stiu arising in actinic keratosis   TOTAL KNEE ARTHROPLASTY Left 07/15/2021   Procedure: TOTAL KNEE ARTHROPLASTY;  Surgeon: Teryl Lucy, MD;  Location: [facility];  Service: Orthopedics;  Laterality: Left;   Undescended testes Right 10/05/1966   Social History:  Social History   Socioeconomic History   Marital status: Single    Spouse name: Not on file   Number of children: Not on file   Years of education: Not on file   Highest education level: Not on file  Occupational History   Not on file  Tobacco Use   Smoking status: Never   Smokeless tobacco: Never  Vaping Use   Vaping status: Never Used  Substance and Sexual Activity   Alcohol use: Not Currently    Alcohol/week: 4.0 standard drinks of alcohol    Types: 4 drink(s) per week    Comment: occasionally   Drug use: No   Sexual activity: Yes  Other Topics Concern   Not on file  Social History Narrative   Not on file   Social Drivers of Health   Financial  Resource Strain: Low Risk  (12/29/2022)   Overall Financial Resource Strain (CARDIA)    Difficulty of Paying Living Expenses: Not hard at all  Food Insecurity: No Food Insecurity (05/24/2023)   Hunger Vital Sign    Worried About Running Out of Food in the Last Year: Never true    Ran Out of Food in the Last Year: Never true  Transportation Needs: No Transportation Needs (05/24/2023)   PRAPARE - Administrator, Civil Service (Medical): No    Lack of Transportation (Non-Medical): No  Physical Activity:  Insufficiently Active (12/29/2022)   Exercise Vital Sign    Days of Exercise per Week: 2 days    Minutes of Exercise per Session: 20 min  Stress: No Stress Concern Present (12/29/2022)   [employer] of Occupational Health - Occupational Stress Questionnaire    Feeling of Stress : Not at all  Social Connections: Not on file  Intimate Partner Violence: Not At Risk (05/16/2023)   Humiliation, Afraid, Rape, and Kick questionnaire    Fear of Current or Ex-Partner: No    Emotionally Abused: No    Physically Abused: No    Sexually Abused: No   Family History:  Family History  Problem Relation Age of Onset   Breast cancer Mother    Arrhythmia Father    Heart failure Father    Breast cancer Sister     Review of Systems: Constitutional: Doesn't report fevers, chills or abnormal weight loss Eyes: Doesn't report blurriness of vision Ears, nose, mouth, throat, and face: Doesn't report sore throat Respiratory: Doesn't report cough, dyspnea or wheezes Cardiovascular: Doesn't report palpitation, chest discomfort  Gastrointestinal:  Doesn't report nausea, constipation, diarrhea GU: Doesn't report incontinence Skin: Doesn't report skin rashes Neurological: Per HPI Musculoskeletal: Doesn't report joint pain Behavioral/Psych: Doesn't report anxiety  Physical Exam: Vitals:   10/25/23 1129  BP: 137/79  Pulse: 72  Resp: 15  Temp: 97.7 F (36.5 C)  SpO2: 96%    KPS:  80. General: Alert, cooperative, pleasant, in no acute distress Head: Normal EENT: No conjunctival injection or scleral icterus.  Lungs: Resp effort normal Cardiac: Regular rate Abdomen: Non-distended abdomen Skin: No rashes cyanosis or petechiae. Extremities: No clubbing or edema  Neurologic Exam: Mental Status: Awake, alert, attentive to examiner. Oriented to self and environment. Language is fluent with intact comprehension.  Cranial Nerves: Visual acuity is grossly normal. Visual fields are full. Extra-ocular movements intact. No ptosis. Face is symmetric Motor: Tone and bulk are normal. Symmetric, high frequency action tremor in hands only. Reflexes are symmetric, no pathologic reflexes present.  Sensory: Stocking impairment Gait: Sensory dystaxia   Labs: I have reviewed the data as listed    Component Value Date/Time   NA 139 10/19/2023 1047   NA 139 06/01/2023 1115   NA 139 04/05/2014 0849   K 4.0 10/19/2023 1047   K 4.4 04/05/2014 0849   CL 106 10/19/2023 1047   CO2 29 10/19/2023 1047   CO2 23 04/05/2014 0849   GLUCOSE 89 10/19/2023 1047   GLUCOSE 109 04/05/2014 0849   BUN 17 10/19/2023 1047   BUN 26 06/01/2023 1115   BUN 16.8 04/05/2014 0849   CREATININE 0.78 10/19/2023 1047   CREATININE 0.80 10/29/2022 1046   CREATININE 0.91 07/29/2017 1004   CREATININE 0.9 04/05/2014 0849   CALCIUM 9.5 10/19/2023 1047   CALCIUM 8.7 04/05/2014 0849   PROT 6.9 10/19/2023 1047   PROT 6.3 06/01/2023 1115   PROT 6.7 04/05/2014 0849   ALBUMIN 3.8 10/19/2023 1047   ALBUMIN 4.1 06/01/2023 1115   ALBUMIN 3.8 04/05/2014 0849   AST 22 10/19/2023 1047   AST 24 10/29/2022 1046   AST 26 04/05/2014 0849   ALT 20 10/19/2023 1047   ALT 31 10/29/2022 1046   ALT 30 04/05/2014 0849   ALKPHOS 57 10/19/2023 1047   ALKPHOS 75 04/05/2014 0849   BILITOT 0.9 10/19/2023 1047   BILITOT 0.9 06/01/2023 1115   BILITOT 0.9 10/29/2022 1046   BILITOT 0.71 04/05/2014 0849   GFRNONAA >60 10/19/2023  1047  GFRNONAA >60 10/29/2022 1046   GFRAA >60 06/14/2020 0830   Lab Results  Component Value Date   WBC 5.0 10/19/2023   NEUTROABS 1.9 10/19/2023   HGB 15.0 10/19/2023   HCT 44.0 10/19/2023   MCV 99.1 10/19/2023   PLT 251 10/19/2023    Imaging:  [facility] Clinician Interpretation: I have personally reviewed the CNS images as listed.  My interpretation, in the context of the patient's clinical presentation, is stable disease   MR LUMBAR SPINE W WO CONTRAST Result Date: 10/20/2023 CLINICAL DATA:  Brain/CNS neoplasm. Monitor. Diffuse back pain. History of multiple myeloma. EXAM: MRI LUMBAR SPINE WITHOUT AND WITH CONTRAST TECHNIQUE: Multiplanar and multiecho pulse sequences of the lumbar spine were obtained without and with intravenous contrast. CONTRAST:  10mL GADAVIST GADOBUTROL 1 MMOL/ML IV SOLN COMPARISON:  06/11/2023.  10/28/2020. FINDINGS: Segmentation: 5 lumbar type vertebral bodies as numbered previously. Alignment:  Minimal scoliotic curvature.  No significant listhesis. Vertebrae: Stable chronic bone marrow changes at the posterior left inferior corner of T11 without evidence of progressive or active disease. T12 is negative. At L1, minimal enhancement at the anterior inferior corner of the vertebral body appears slightly regressed. Focal enhancement at the tip of the spinous process appears similar and could be a treated lesion. At L2, there is a chronic inferior endplate Schmorl's node without worrisome change. L3 and L4 are negative for evidence of myeloma involvement. L5 shows marrow changes related to previous radiation. Previous treated lesion in the left vertebral body and left posterior elements without evidence of new or progressive change. Bone abnormality at the S2 sacral level appears unchanged and is consistent with treated or stable disease. Conus medullaris and cauda equina: Conus extends to the L1 level. Conus and cauda equina appear normal. Paraspinal and other soft tissues:  Negative Disc levels: L2-3: Bulging of the disc. Mild stenosis of both lateral recesses and foramina, similar to the prior exam. L3-4: Bulging of the disc with shallow protrusion and caudal migration, unchanged since the prior exam. Narrowing of the lateral recesses right more than left. L4-5: Disc bulge.  No compressive stenosis. L5-S1: Disc bulge.  Chronic bony foraminal narrowing on the left. IMPRESSION: 1. No evidence of progressive or definite active disease. 2. Numerous foci of chronic bone signal abnormality as outlined above could be chronic treated sequela or inactive disease. 3. L2-3: Bulging of the disc. Mild stenosis of both lateral recesses and foramina, similar to the prior exam. 4. L3-4: Bulging of the disc with shallow protrusion and caudal migration, unchanged since the prior exam. Narrowing of the lateral recesses right more than left. 5. L5-S1: Disc bulge. Chronic bony foraminal narrowing on the left. Electronically Signed   By: Paulina Fusi M.D.   On: 10/20/2023 15:27   Assessment/Plan Essential tremor  Peripheral neuropathy due to chemotherapy [facility])  Jesse [place]. Is clinically stable today.  MRI L-spine demonstrates stable burden of treated neoplasm, ongoing spondyloarthritic changes.  May resume primidone 25mg  HS if tremor recurs.  We appreciate the opportunity to participate in the care of Jesse Mcclure.Marland Kitchen  He may return as needed.  All questions were answered. The patient knows to call the clinic with any problems, questions or concerns. No barriers to learning were detected.  The total time spent in the encounter was 40 minutes and more than 50% was on counseling and review of test results   Henreitta Leber, MD Medical Director of Neuro-Oncology [facility] [place] [facility] 10/25/23 2:00  PM

## 2023-10-26 ENCOUNTER — Telehealth: Payer: Self-pay

## 2023-10-26 ENCOUNTER — Other Ambulatory Visit: Payer: Self-pay | Admitting: Hematology and Oncology

## 2023-10-26 ENCOUNTER — Ambulatory Visit: Payer: Medicare Other | Admitting: Physical Therapy

## 2023-10-26 DIAGNOSIS — M6281 Muscle weakness (generalized): Secondary | ICD-10-CM

## 2023-10-26 DIAGNOSIS — R2689 Other abnormalities of gait and mobility: Secondary | ICD-10-CM

## 2023-10-26 DIAGNOSIS — R262 Difficulty in walking, not elsewhere classified: Secondary | ICD-10-CM | POA: Diagnosis not present

## 2023-10-26 LAB — MULTIPLE MYELOMA PANEL, SERUM
Albumin SerPl Elph-Mcnc: 3.3 g/dL (ref 2.9–4.4)
Albumin/Glob SerPl: 1.1 (ref 0.7–1.7)
Alpha 1: 0.3 g/dL (ref 0.0–0.4)
Alpha2 Glob SerPl Elph-Mcnc: 0.9 g/dL (ref 0.4–1.0)
B-Globulin SerPl Elph-Mcnc: 0.9 g/dL (ref 0.7–1.3)
Gamma Glob SerPl Elph-Mcnc: 1.2 g/dL (ref 0.4–1.8)
Globulin, Total: 3.2 g/dL (ref 2.2–3.9)
IgA: 163 mg/dL (ref 61–437)
IgG (Immunoglobin G), Serum: 1322 mg/dL (ref 603–1613)
IgM (Immunoglobulin M), Srm: 65 mg/dL (ref 15–143)
M Protein SerPl Elph-Mcnc: 0.5 g/dL — ABNORMAL HIGH
Total Protein ELP: 6.5 g/dL (ref 6.0–8.5)

## 2023-10-26 MED ORDER — OXYCODONE HCL 5 MG PO TABS: 5.0000 mg | ORAL_TABLET | ORAL | 0 refills | Status: DC | PRN

## 2023-10-26 NOTE — Telephone Encounter (Signed)
Called and told Rx sent to [facility]. He verbalized understanding.

## 2023-10-26 NOTE — Telephone Encounter (Signed)
done

## 2023-10-26 NOTE — Therapy (Signed)
OUTPATIENT PHYSICAL THERAPY TREATMENT/DISCHARGE SUMMARY      Patient Name: Jesse Mcclure. MRN: [id number] DOB:1950/10/27, 73 y.o., adult Today's Date: 10/26/2023   PCP: Sharlot Gowda MD REFERRING PROVIDER: Artis Delay MD  END OF SESSION:  PT End of Session - 10/26/23 1526     Visit Number 23    Date for PT Re-Evaluation 11/16/23    Authorization Type Medicare    Progress Note Due on Visit 30    PT Start Time 1530    PT Stop Time 1614    PT Time Calculation (min) 44 min    Activity Tolerance Patient tolerated treatment well                 Past Medical History:  Diagnosis Date   BPH (benign prostatic hypertrophy)    Degenerative disc disease    Diverticulosis    Hemochromatosis    HTN (hypertension)    Hyperlipidemia    Multiple myeloma (HCC)    Polio    Status post Nissen fundoplication (without gastrostomy tube) procedure    for hiatal hernia.   Past Surgical History:  Procedure Laterality Date   FOOT SURGERY     HIATAL HERNIA REPAIR     OTHER SURGICAL HISTORY     Cataract Surgery--Both eyes   SKIN BIOPSY Right 04/13/2022   squamous cell carcinoma in stiu arising in actinic keratosis   TOTAL KNEE ARTHROPLASTY Left 07/15/2021   Procedure: TOTAL KNEE ARTHROPLASTY;  Surgeon: Teryl Lucy, MD;  Location: [facility];  Service: Orthopedics;  Laterality: Left;   Undescended testes Right 10/05/1966   Patient Active Problem List   Diagnosis Date Noted   Bone loss of mandible 08/27/2023   Snoring 06/08/2023   Chronic midline low back pain without sciatica 06/01/2023   Idiopathic gout 06/01/2023   (HFpEF) heart failure with preserved ejection fraction (HCC) 05/17/2023   Shortness of breath 05/16/2023   Essential tremor 05/11/2023   Balance disorder 04/23/2023   Squamous cell carcinoma in situ 04/14/2023   Melanoma in situ (HCC) 04/14/2023   History of pulmonary embolus (PE) 09/04/2022   History of basal cell cancer 05/01/2022   First degree AV block  04/22/2022   History of skin cancer 04/21/2022   S/P TKR (total knee replacement), left 07/15/2021   Osteoarthritis of left knee 05/22/2021   Deficiency anemia 01/07/2021   ACE-inhibitor cough 12/16/2020   Tremor of both hands 10/11/2019   Peripheral neuropathy due to chemotherapy (HCC) 10/11/2019   Dry skin 10/11/2019   Insomnia disorder 09/06/2019   Loose stools 09/06/2019   S/P bone marrow transplant (HCC) 08/30/2019   Hypocalcemia 06/14/2019   Multiple myeloma without remission (HCC) 04/04/2019   Other constipation 04/04/2019   Glucose intolerance (impaired glucose tolerance) 05/20/2016   Vitamin D deficiency 05/20/2016   Chronic musculoskeletal pain 01/09/2016   History of orchiectomy, unilateral 05/20/2015   Obesity (BMI 30-39.9) 05/20/2015   Elevated PSA 05/20/2015   Arthritis 05/20/2015   Essential hypertension 03/02/2012   Hyperlipidemia 03/02/2012   Hemochromatosis, hereditary (HCC) 11/28/2008    ONSET DATE: > 1 year  REFERRING DIAG: G25.2 intention tremor; R26.89 balance disorder  THERAPY DIAG: difficulty in walking; fall risk Muscle weakness (generalized)  Difficulty in walking, not elsewhere classified  Balance disorder  Rationale for Evaluation and Treatment: Rehabilitation  SUBJECTIVE:  SUBJECTIVE STATEMENT: No news from oncologist, both doctors say I'm boring (which is a good thing).   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  My back woke me up last night.  Went to [employer] but no one available for a tour and no one has contacted me.      has handicapped parking Eval: Being treated for cancer 4-5 years including oral chemo at home;  Dr wants me to walk and I use poles b/c I'm very wobbly.  Dr. says it's not the medication causing this.  Went on cruise in [place]  a few months ago but I couldn't tour and stayed on the boat. No falls but several close calls: 3-4 steps garage steps into the house, stumbled going up.  If I sit a while then get up and take a few steps, will feel wobbly.  Hold onto something to pick up something from the floor. Denies loss of sensation in feet. I sleep a lot.  I'm very sedentary.   My partner thinks I need to move more.  I ride the stationary bike Tuesday/Thursday occasionally.  I used to have a trainer prior to getting sick. I get wheelchair service at airports. [place] trip in [place]  Considering [facility]  Goes by Jesse Mcclure  PERTINENT HISTORY:  Oncology History  Multiple myeloma without remission (HCC)  03/20/2019 Imaging    1. Tumor involving the L5 and S1 and S2 segments of the spine as described with mass effects upon the left L4, L5, S1 and more distal left sacral nerves   Left TKR HTN Back pain  PAIN:  Are you having pain? Yes  NPRS scale:  3/10 Pain location: LBP   Aggravating factors: standing in the kitchen Relieving factors: ibuprofen, oxy if it gets bad   PRECAUTIONS: Fall    WEIGHT BEARING RESTRICTIONS: No  FALLS: Has patient fallen in last 6 months? No  LIVING ENVIRONMENT: Lives with: partner Lives in: House/apartment 2 story main living downstairs Stairs: Yes: Internal: 10 steps; on right going up and External: 3 steps; on left going up Has following equipment at home: walking poles   PLOF: partner does cooking, pt does some grilling Enjoys being with friends, hanging out; doesn't go to social events  b/c I can't stand for long time 10 minutes  PATIENT GOALS: I want to walk better, stand longer; going to [place] in May for 11 days; ride bicycles there;  I'd like to not have to use wheelchair service at the airport  The Patient-Specific Functional Scale  Initial:  I am going to ask you to identify up to 3 important activities that you are unable to do or are having difficulty with as a result  of this problem.  Today are there any activities that you are unable to do or having difficulty with because of this?  (Patient shown scale and patient rated each activity)  Follow up: When you first came in you had difficulty performing these activities.  Today do you still have difficulty?  Patient-Specific activity scoring scheme (Point to one number):  0 1 2 3 4 5 6 7 8 9  10 Unable  Able to perform To perform                                                                                                    activity at the same Activity         Level as before                                                                                                                       Injury or problem  Activity     Standing for parties                                    Initial:       4               11/14:   8/10 much improved! 2.        Going to [employer] show                              Initial:    3                  11/14:  8/10 improved!    OBJECTIVE:    COGNITION: Overall cognitive status: Within functional limits for tasks assessed    LOWER EXTREMITY ROM:   grossly WFLs  LOWER EXTREMITY MMT:    MMT Right Eval Right  07/20/23 Left Eval Left  07/20/23  Hip flexion  4  4  Hip extension      Hip abduction 4 4+ 4 4+  Hip adduction      Hip internal rotation      Hip external rotation      Knee flexion      Knee extension 4 4 4 4   Ankle dorsiflexion 4 4 4 4   Ankle plantarflexion 4 4 4 4   Ankle inversion      Ankle eversion      (Blank rows = not tested)           TRUNK STRENGTH:  Decreased activation of transverse abdominus muscles; abdominals 4-/5; decreased activation of lumbar multifidi; trunk extensors 4-/5 07/20/23:  Improved to 4/5                                  GAIT: Comments: wider base of support; decreased step length; decreased dorsiflexion  FUNCTIONAL  TESTS:  5x sit to stand: 20.31 no hands TUG 12.11 sec no hands 2 min walk test 366 feet RPE 7/10 BERG balance test:  43/56 indicating a 50% risk of falls; most challenged by single limb standing or narrow base of support; decreased speed of movement, limited forward reach  9/19: 5 sit to stand 12.33 no hands TUG 11.61 no hands BERG: 49/56 Dynamic Gait Index: 18/24 difficulty with head turns and head extension  10/15: 5 sit to stand 12.98 no hands TUG 7.53 sec no hands BERG: 49/56 2 min walk test: 420.7 feet with RPE of 7/10  11/14: 12.72 5x STS TUG 8.37 6 MWT 866 1 rest break RPE 5/10 O2 95% HR85   12/12: 5x STS 12.97 no hand Tug 7.92 6 MWT 981 RPE 7/10  1/14 1018 no rest breaks RPE 7/10  MINI-BESTest: Balance Evaluation Systems Test ANTICIPATORY: SIT TO STAND: (2) normal without use of hands and stabilizes independently     (1) Moderate comes to stand WITH use of hands on 1st attempt    (0) Severe: unable to stand up from chair without assistance OR needs several attempts with use of   hands Score:2  2. RISE TO TOES: feet shoulder width apart. Hands on hips.  Rise as high as you can onto your toes. Try to hold this pose for 3 sec                          (2) Mcclure for 3s with maximum height    (1) heels up, but not full range (smaller than when holding hands) OR noticeable instability for 3 sec                           (0) < or equat to 3 s  Score: 1   12/12  2    1/14 : 2  3. STAND ON ONE LEG: look straight ahead. Hands on hips. Lift your leg off the ground.     Left:  trial 1 __2__ trial 2__2__                                    Right: Trial 1:___2__   Trial 2:___2____   (2) 20 s       (2)  20 s                (1) < 20      (1) < 20 s   (0) Unable      (0) unable Score (use the worst side): 1   12/12 1    1/14: 1  REACTIVE POSTURAL CONTROL 4. COMPENSATORY STEPPING CORRECTION FORWARD stand with feet shoulder width apart, arms at your sides. Lean forward against  my hands beyond your forward limits.  When I let go, do whatever is necessary, including taking a step, to avoid a fall   (2) recovers independently  with a single, large step, to avoid a fall   (1) more than one step used to recover equilibrium   (0) No step OR would fall if not caught Score: 1  5. COMPENSATORY STEPPING CORRECTION BACKWARD: Lean backward against my hands beyond your backward limits.  When I let go, do whatever is necessary, including a step to avoid a fall. (2) recovers independently  with a single, large step, to avoid a fall   (  1) more than one step used to recover equilibrium   (0) No step OR would fall if not caught Score: 1  6. COMPENSATORY STEPPING LATERAL: Lean into my hand beyond your sideways limit.  When I let go, do whatever is necessary, including taking a step, to avoid a fall. Left           Right   (2) recovers independently with 1 step (crossover or lateral OK)   (1) several steps to recover equilibrium   (0) falls or cannot step Score: 2 SENSORY ORIENTATION 7. STANCE FEET TOGETHER EYES OPEN  firm surface Be as Mcclure and still as possible until I say stop   (2) 30s   (1) < 30 s   (0) unable Score: 2  8. STANCE ON FOAM EYES CLOSED feet together, eyes closed, hands on hips. Be as Mcclure and still as you can until I stay stop.  I will start timing when you close your eyes. Time in seconds:      (2) 30s   (1) <30 s   (0) unable Score: 1     9. INCLINE EYES CLOSED: Stand on incline with feet shoulder width apart, arms down at your sides.  I will start timing when you close your eyes   (2) Stands 30 s and aligns with gravity   (1) stands < 30 s OR aligns with surface   (0) unable Score: 2  DYNAMIC GAIT 10. CHANGE IN GAIT SPEED: begin walking at your normal speed, when I tell you "fast", walk as fast as you can, when I say "slow" walk very slowly   (2) significantly changes walking speed without imbalance   (1) unable to change walking speed or  signs of imbalance   (0) unable to achieve significant change in walking speed AND signes of imbalance Score: 2  11. WALK WITH HEAD TURNS HORIZONTAL: begin walking at normal speed, when I say right, turn your head to the right, when I say left turn your head to the left.  Try to keep yourself walking in a straight line   (2) performs head turns with no change in gait speed and good balance   (1) performs head turns with reduction in gait speed   (0)  performs head turns with imbalance Score: 2  12. WALK WITH PIVOT TURNS: begin walking at your normal speed.  When I tell you to "turn and stop", turn as quickly as you can, face the opposite direction and stop.  After the turn, your feet should be close together   (2) turns with feet close FAST in 3 steps with good balance   (1) turns with feet close SLOW > 4 steps with good balance   (0) cannot turn with feet close at any speed without imbalance Score: 2  13. STEP OVER OBSTACLES:  ( 9 inch height 10 feet away from subject) begin walking at your normal speed.  When you get to the box, step over it, not around it and keep walking   (2) Able to step over box with minimal change of gait speed and good balance   (1) Steps over box but touches box OR displays cautious behavior by slowing gait   (0) Unable to step over box OR steps around box Score: 1  14. TIMED UP AND GO NORMAL AND WITH DUAL TASK: 3 METER WALK: When I say go walk at your your normal speed across the tape, turn around and come back to sit  in the chair   Time: Count backwards by 3s starting at    20.  When I say go, stand up from the chair, walk at your normal speed across the tape on the floor, turn around, come back to sit in the chair.  Continue counting backwards the entire time.  Time:   (2) No noticeable change in sitting, standing or walking while backward counting when compared to TUG without dual task   (1) Dual task affects either counting OR walking (>10%)    (0) stops  counting while walking OR stops walking while counting If gait speed slows more than 10% between TUG without and with dual task, the score should be decreased by a point Score:  1  Total score:         19  /28 12/12:  21/28 1/14:  23/28       TODAY'S TREATMENT:       DATE:   10/26/2023 NuStep L 3 x 5 min (blue machine)-PT present to discuss progress Standing core strengthening series holding 5 pound weight while marching sets of 10 reps each:  1) Farmers hold; 2) single at the shoulder hold Farmers carry 5# unilateral 1 lap x 2 Standing lat bar 35# 10x 2 Seated trunk rotation 5# 15x Sit ups with 4# with press overhead 20x  Seated trunk extension/rows cable 15# 20x Resisted walk: 15# backward only 10x Leg press: reclined back seat 9 90#  bil 10x2;  45# single leg right/left  10x  each side  10/19/2023 NuStep L 3 x 5 min (blue machine)-PT present to discuss progress Standing lat bar 35# 10x 2 MiniBESTtest 6 MWT Info on Right Start 9 week program at [employer]  10/12/2023 NuStep L 3 x 5 min (blue machine)-PT present to discuss progress Standing lat bar 35# 10x 2 Seated trunk rotation 4# 30 sec Sit ups with 4# with press overhead 20x  Resisted walk: 15# backward only 10x Seated dead lifts 10# 15x Leg press: reclined back seat 9 80#  bil 15x2;  40# single leg right/left  15x  each side Walking 2 min (moderate short of breath)  12/12: NuStep L 3 x 5 min (blue machine)-PT present to discuss progress Standing lat bar 35# 10x 2 Sit ups with 10# kettlebell press overhead 12x  Resisted walk: 15# backward only 10x Leg press: reclined back (worked well) seat 9 80#  bil 15x2; 40# single leg right/left  15x  each side 5x STS TUG 6 MWT MiniBESTtest 12/5: NuStep L 3 x 5 min (blue machine)-PT present to discuss progress Standing lat bar 35# 10x 2 Seated lumbar stretch 3 way blue ball rolls 15x Seated red power cord trunk extension 20x Up and down steps 2 railings 4x Chair sit  ups 10# kettlebell 15x 6 inch step taps holding 10# kettlebell 2 sets of 10x with light UE support walk 2 min some increase in back pain and shortness of breath/fatigue    PATIENT EDUCATION: Education details: educated on slow long breaths in through nose and long slow breaths out through pursed lips to avoid hyperventilation.  Person educated: Patient Education method: Explanation Education comprehension: verbalized understanding    GOALS: Goals reviewed with patient? Yes  SHORT TERM GOALS: Target date: 05/27/2023   The patient will demonstrate knowledge of basic self care strategies and exercises to promote strength and mobility  Baseline: Goal status: met 9/19  2.  Able to walk 2 min 375 feet with RPE 5/10 Baseline:  Goal status:  partially met RPE 7/10 today but much improved distance  3.  The patient will have an improved BERG balance score to   46 /56 indicating reduced risk of falls Baseline:  Goal status: met 9/19  4.  Improved LE strength and balance  indicated by improved 5x STS to 16 sec Baseline:  Goal status: met 9/19  5.  Set DGI for MiniBESTest goal Baseline:  Goal status: met 9/19    LONG TERM GOALS: Target date: 10/14/2023   The patient will be independent in a safe self progression of a home exercise program and transition to gym program to promote further recovery of function  Baseline:  Goal status: revised  2.  The patient will have improved gait stamina and speed needed to ambulate 600 feet in 6 minutes with RPE of 6/10 Baseline:  Goal status: =met 11/14  3.  The patient will have an improved BERG balance score to  48  /56 indicating reduced risk of falls  Baseline:  Goal status: met 9/16  4.  The patient will have improved Timed Up and Go (TUG) time to    <12   sec indicating improved gait speed and LE strength  Baseline:  Goal status: met 9/19  5.  The patient will have improved hip and knee strength to at least 4+/5 needed for  standing >10 min for washing dishes Baseline:  Goal status: ongoing  6. Improved balance with DGI to 21/24 indicating decreased risk of falls Met 1/14  7. Crowds at [facility] and walk to car/parking lot with PSFS to 5 Met 11/14 8. Standing for 3 minutes for parties for socializing with PSFS of 6 Met 11/14  9. Mini best test improved to 23/28 indicating improved balance and dec risk of falls Met 1/14  10.  The patient will have improved gait stamina and speed needed to ambulate 950 feet in 6 minutes Met 12/12     ASSESSMENT:  CLINICAL IMPRESSION: Some shortness of breath with exercise but recovers quickly with short seated rest breaks.  Able to increase intensity/resistance with several exercises today. Therapist monitoring response and modifying treatment accordingly. Jesse Mcclure is investigating community exercise options to continue with strengthening after discharge from PT.      OBJECTIVE IMPAIRMENTS: cardiopulmonary status limiting activity, decreased activity tolerance, decreased balance, decreased endurance, decreased mobility, difficulty walking, decreased strength, impaired perceived functional ability, and pain.   ACTIVITY LIMITATIONS: carrying, lifting, bending, standing, squatting, and locomotion level  PARTICIPATION LIMITATIONS: meal prep, cleaning, interpersonal relationship, shopping, and community activity  PERSONAL FACTORS: Fitness, Time since onset of injury/illness/exacerbation, and 3+ comorbidities: multiple myeloma, HTN, DDD lumbar spine  are also affecting patient's functional outcome.   REHAB POTENTIAL: Good  CLINICAL DECISION MAKING: Evolving/moderate complexity  EVALUATION COMPLEXITY: Moderate  PLAN:  PT FREQUENCY: 2x/week  PT DURATION: 8 weeks  PLANNED INTERVENTIONS: Therapeutic exercises, Therapeutic activity, Neuromuscular re-education, Balance training, Gait training, Patient/Family education, Self Care, Stair training, Aquatic Therapy, Cryotherapy,  Moist heat, Manual therapy, and Re-evaluation  PLAN FOR NEXT SESSION: farmer's carries;  finalize HEP;  balance soft surface/ [employer]; eyes closed;  leg press recline seat:  dual task walking, reactionary balance, eyes closed and foam balance; given info on [employer] program;  resisted backward walk; general strengthening; gradually increase time on his feet; step over obstacles for toe clearance/balance  Lavinia Sharps, PT 10/26/23 5:08 PM Phone: [phone] Fax: [phone]      PHYSICAL THERAPY DISCHARGE SUMMARY  Visits from Start of Care: 23  Current  functional level related to goals / functional outcomes: Jesse Mcclure called to cancel his last appointment and expresses readiness for discharge from PT.   Remaining deficits: As above   Education / Equipment: HEP   Patient agrees to discharge. Patient goals were partially met. Patient is being discharged due to being pleased with the current functional level.  Lavinia Sharps, PT 10/28/23 12:56 PM Phone: [phone] Fax: [phone]

## 2023-10-26 NOTE — Telephone Encounter (Signed)
He called and left a message requesting refill of Oxycodone to pharmacy.

## 2023-10-28 ENCOUNTER — Ambulatory Visit: Payer: Medicare Other | Admitting: Physical Therapy

## 2023-10-29 ENCOUNTER — Encounter: Payer: Self-pay | Admitting: Hematology and Oncology

## 2023-11-08 ENCOUNTER — Telehealth: Payer: Self-pay

## 2023-11-08 ENCOUNTER — Other Ambulatory Visit: Payer: Self-pay | Admitting: Hematology and Oncology

## 2023-11-08 MED ORDER — OXYCODONE HCL 5 MG PO TABS: 5.0000 mg | ORAL_TABLET | ORAL | 0 refills | Status: DC | PRN

## 2023-11-08 MED ORDER — FUROSEMIDE 40 MG PO TABS
40.0000 mg | ORAL_TABLET | Freq: Every day | ORAL | 3 refills | Status: DC
  Filled 2024-01-20: qty 60, 60d supply, fill #0
  Filled 2024-03-27: qty 60, 60d supply, fill #1
  Filled 2024-06-01: qty 60, 60d supply, fill #2

## 2023-11-08 NOTE — Telephone Encounter (Signed)
He called and left a message requesting Oxycodone refill to pharmacy. He is also asking for a refill on lasix. He is asking if you can refill?

## 2023-11-08 NOTE — Telephone Encounter (Signed)
Called and given below message. He verbalized understanding and appreciated the call back.  Just FYI, he had his tooth pulled on 1/29 and everything went great.

## 2023-11-08 NOTE — Telephone Encounter (Signed)
I sent oxycodone refill I will refill lasix but changed to once a day; I do not think he needs high dose anymore

## 2023-11-10 ENCOUNTER — Other Ambulatory Visit: Payer: Self-pay | Admitting: Hematology and Oncology

## 2023-11-10 DIAGNOSIS — C9000 Multiple myeloma not having achieved remission: Secondary | ICD-10-CM

## 2023-11-11 ENCOUNTER — Other Ambulatory Visit: Payer: Self-pay

## 2023-11-11 ENCOUNTER — Encounter: Payer: Self-pay | Admitting: Podiatry

## 2023-11-11 ENCOUNTER — Ambulatory Visit (INDEPENDENT_AMBULATORY_CARE_PROVIDER_SITE_OTHER): Payer: Medicare Other | Admitting: Podiatry

## 2023-11-11 DIAGNOSIS — Z7901 Long term (current) use of anticoagulants: Secondary | ICD-10-CM

## 2023-11-11 DIAGNOSIS — L84 Corns and callosities: Secondary | ICD-10-CM

## 2023-11-11 DIAGNOSIS — C9000 Multiple myeloma not having achieved remission: Secondary | ICD-10-CM

## 2023-11-11 MED ORDER — POMALIDOMIDE 3 MG PO CAPS: 3.0000 mg | ORAL_CAPSULE | Freq: Every day | ORAL | 0 refills | Status: DC

## 2023-11-14 NOTE — Progress Notes (Signed)
 Subjective:   Patient ID: Jesse Mcclure., adult   DOB: 73 y.o.   MRN: [id number]   HPI Chief Complaint  Patient presents with   Callouses    RM#13 Follow up on left foot callus causing pain needs shaving.    73 year old male presents the office today with above concerns.  He has a skin lesion on the plantar aspect of the left foot submetatarsal 1 that hurts to walk.  Does not report any open lesions or any drainage.  No other concerns.  He is on xarelto    Objective:  Physical Exam  General: AAO x3, NAD  Dermatological: Hyperkeratotic lesion noted left foot submetatarsal 1 without any underlying ulceration drainage or signs of infection.  There are no open lesions.  Vascular: Dorsalis Pedis artery and Posterior Tibial artery pedal pulses are 2/4 bilateral with immedate capillary fill time. There is no pain with calf compression, swelling, warmth, erythema.   Neruologic: Grossly intact via light touch bilateral.   Musculoskeletal: Prominence of metatarsal head plantarly with atrophy of the fat pad.  No other areas of discomfort.  Gait: Unassisted, Nonantalgic.       Assessment:   Skin lesion right foot; chronic anticoagulation    Plan:  -Treatment options discussed including all alternatives, risks, and complications -Etiology of symptoms were discussed -Sharply debrided lesion x1 with any complications or bleeding.  Discussed moisturizer, offloading.  Dispensed offloading pads.  Jesse Mcclure DPM

## 2023-11-15 ENCOUNTER — Other Ambulatory Visit: Payer: Self-pay | Admitting: Medical

## 2023-11-15 DIAGNOSIS — E785 Hyperlipidemia, unspecified: Secondary | ICD-10-CM

## 2023-11-19 ENCOUNTER — Other Ambulatory Visit: Payer: Self-pay | Admitting: Hematology and Oncology

## 2023-11-19 ENCOUNTER — Telehealth: Payer: Self-pay

## 2023-11-19 MED ORDER — OXYCODONE HCL 5 MG PO TABS
5.0000 mg | ORAL_TABLET | ORAL | 0 refills | Status: DC | PRN
Start: 2023-11-19 — End: 2023-12-02

## 2023-11-19 NOTE — Telephone Encounter (Signed)
done

## 2023-11-19 NOTE — Telephone Encounter (Signed)
Called and told Rx sent to [facility]. He ask about [facility], told him Rx sent in January with refills. He verbalized understanding.

## 2023-11-19 NOTE — Telephone Encounter (Signed)
He called and requested oxycodone refill to pharmacy please.

## 2023-11-23 ENCOUNTER — Other Ambulatory Visit (HOSPITAL_COMMUNITY): Payer: Self-pay

## 2023-11-23 ENCOUNTER — Other Ambulatory Visit: Payer: Self-pay

## 2023-11-23 ENCOUNTER — Telehealth: Payer: Self-pay | Admitting: Family Medicine

## 2023-11-23 ENCOUNTER — Other Ambulatory Visit: Payer: Self-pay | Admitting: Hematology and Oncology

## 2023-11-23 DIAGNOSIS — E785 Hyperlipidemia, unspecified: Secondary | ICD-10-CM

## 2023-11-23 DIAGNOSIS — C9000 Multiple myeloma not having achieved remission: Secondary | ICD-10-CM

## 2023-11-23 MED ORDER — ATORVASTATIN CALCIUM 20 MG PO TABS
20.0000 mg | ORAL_TABLET | Freq: Every day | ORAL | 0 refills | Status: DC
  Filled 2023-11-24 – 2023-12-03 (×2): qty 90, 90d supply, fill #0

## 2023-11-23 NOTE — Telephone Encounter (Signed)
error 

## 2023-11-24 ENCOUNTER — Encounter: Payer: Self-pay | Admitting: Hematology and Oncology

## 2023-11-24 ENCOUNTER — Other Ambulatory Visit (HOSPITAL_COMMUNITY): Payer: Self-pay

## 2023-11-24 ENCOUNTER — Other Ambulatory Visit (HOSPITAL_BASED_OUTPATIENT_CLINIC_OR_DEPARTMENT_OTHER): Payer: Self-pay

## 2023-11-24 MED ORDER — RIVAROXABAN 10 MG PO TABS
10.0000 mg | ORAL_TABLET | Freq: Every day | ORAL | 1 refills | Status: DC
  Filled 2023-12-14 – 2024-03-20 (×2): qty 90, 90d supply, fill #0

## 2023-11-24 MED FILL — Acyclovir Tab 400 MG: ORAL | 90 days supply | Qty: 90 | Fill #0 | Status: CN

## 2023-11-30 ENCOUNTER — Encounter: Payer: Self-pay | Admitting: Internal Medicine

## 2023-12-01 DIAGNOSIS — M25562 Pain in left knee: Secondary | ICD-10-CM | POA: Diagnosis not present

## 2023-12-02 ENCOUNTER — Telehealth: Payer: Self-pay

## 2023-12-02 ENCOUNTER — Other Ambulatory Visit: Payer: Self-pay | Admitting: Hematology and Oncology

## 2023-12-02 ENCOUNTER — Other Ambulatory Visit (HOSPITAL_COMMUNITY): Payer: Self-pay

## 2023-12-02 MED ORDER — OXYCODONE HCL 5 MG PO TABS
5.0000 mg | ORAL_TABLET | ORAL | 0 refills | Status: DC | PRN
Start: 2023-12-02 — End: 2023-12-16
  Filled 2023-12-02: qty 60, 10d supply, fill #0

## 2023-12-02 NOTE — Telephone Encounter (Signed)
 done

## 2023-12-02 NOTE — Telephone Encounter (Signed)
 He called and left a message requesting refill of Oxycodone to [facility]. He switched all of his medications to [facility] and will no longer be using [facility].

## 2023-12-02 NOTE — Telephone Encounter (Signed)
 Called and told Rx sent to [facility]. He verbalized understanding.

## 2023-12-03 ENCOUNTER — Other Ambulatory Visit (HOSPITAL_COMMUNITY): Payer: Self-pay

## 2023-12-03 DIAGNOSIS — Z961 Presence of intraocular lens: Secondary | ICD-10-CM | POA: Diagnosis not present

## 2023-12-03 MED FILL — Acyclovir Tab 400 MG: ORAL | 90 days supply | Qty: 90 | Fill #0 | Status: AC

## 2023-12-09 ENCOUNTER — Inpatient Hospital Stay: Payer: Medicare Other | Attending: Hematology and Oncology

## 2023-12-09 ENCOUNTER — Inpatient Hospital Stay: Payer: Medicare Other

## 2023-12-09 ENCOUNTER — Inpatient Hospital Stay: Payer: Medicare Other | Admitting: Hematology and Oncology

## 2023-12-09 ENCOUNTER — Encounter: Payer: Self-pay | Admitting: Hematology and Oncology

## 2023-12-09 VITALS — BP 138/62 | HR 81 | Resp 18 | Ht 69.0 in | Wt 229.8 lb

## 2023-12-09 DIAGNOSIS — C9000 Multiple myeloma not having achieved remission: Secondary | ICD-10-CM | POA: Insufficient documentation

## 2023-12-09 DIAGNOSIS — M7918 Myalgia, other site: Secondary | ICD-10-CM | POA: Insufficient documentation

## 2023-12-09 DIAGNOSIS — G8929 Other chronic pain: Secondary | ICD-10-CM | POA: Diagnosis not present

## 2023-12-09 LAB — COMPREHENSIVE METABOLIC PANEL
ALT: 22 U/L (ref 0–44)
AST: 20 U/L (ref 15–41)
Albumin: 4.1 g/dL (ref 3.5–5.0)
Alkaline Phosphatase: 70 U/L (ref 38–126)
Anion gap: 5 (ref 5–15)
BUN: 23 mg/dL (ref 8–23)
CO2: 31 mmol/L (ref 22–32)
Calcium: 9.1 mg/dL (ref 8.9–10.3)
Chloride: 103 mmol/L (ref 98–111)
Creatinine, Ser: 1.02 mg/dL (ref 0.61–1.24)
GFR, Estimated: >60 mL/min (ref >60)
Glucose, Bld: 90 mg/dL (ref 70–99)
Potassium: 3.7 mmol/L (ref 3.5–5.1)
Sodium: 139 mmol/L (ref 135–145)
Total Bilirubin: 1 mg/dL (ref 0.0–1.2)
Total Protein: 7.2 g/dL (ref 6.5–8.1)

## 2023-12-09 LAB — CBC WITH DIFFERENTIAL/PLATELET
Abs Immature Granulocytes: 0.01 10*3/uL (ref 0.00–0.07)
Basophils Absolute: 0.1 10*3/uL (ref 0.0–0.1)
Basophils Relative: 1 %
Eosinophils Absolute: 0.1 10*3/uL (ref 0.0–0.5)
Eosinophils Relative: 2 %
HCT: 47.9 % (ref 39.0–52.0)
Hemoglobin: 15.9 g/dL (ref 13.0–17.0)
Immature Granulocytes: 0 %
Lymphocytes Relative: 42 %
Lymphs Abs: 2 10*3/uL (ref 0.7–4.0)
MCH: 33.7 pg (ref 26.0–34.0)
MCHC: 33.2 g/dL (ref 30.0–36.0)
MCV: 101.5 fL — ABNORMAL HIGH (ref 80.0–100.0)
Monocytes Absolute: 1 10*3/uL (ref 0.1–1.0)
Monocytes Relative: 22 %
Neutro Abs: 1.6 10*3/uL — ABNORMAL LOW (ref 1.7–7.7)
Neutrophils Relative %: 33 %
Platelets: 216 10*3/uL (ref 150–400)
RBC: 4.72 MIL/uL (ref 4.22–5.81)
RDW: 15 % (ref 11.5–15.5)
WBC: 4.8 10*3/uL (ref 4.0–10.5)
nRBC: 0 % (ref 0.0–0.2)

## 2023-12-09 NOTE — Assessment & Plan Note (Addendum)
I refilled his prescription medication to take as needed

## 2023-12-09 NOTE — Progress Notes (Signed)
 [facility] OFFICE PROGRESS NOTE  Patient Care Team: Ronnald Nian, MD as PCP - General (Family Medicine) Wyline Mood Alben Spittle, MD as PCP - Cardiology (Cardiology) Artis Delay, MD as Consulting Physician (Hematology and Oncology) Kennon Rounds as Physician Assistant (Cardiology)  Assessment & Plan Multiple myeloma without remission [facility]) He has recurrent multiple myeloma, IgG kappa subtype, standard risk, well-maintained on current treatment with pomalidomide and Ninlaro.  He is taper off dexamethasone He remains on Xarelto for history of recurrent clots as well as DVT prophylaxis He will continue acyclovir He is on calcium and vitamin D He is due for Zometa infusion next month but due to his travel, we will defer that until May I will continue treatment without changes Chronic musculoskeletal pain I refilled his prescription medication to take as needed  No orders of the defined types were placed in this encounter.    Artis Delay, MD  INTERVAL HISTORY: he returns for treatment follow-up on maintenance treatment with pomalidomide and Ninlaro for recurrent myeloma. Complications related to previous cycle of chemotherapy included bone aches, Denies recent infection No recent dental issues He request changes to his treatment due to upcoming vacation and travels  PHYSICAL EXAMINATION: ECOG PERFORMANCE STATUS: 0 - Asymptomatic  Vitals:   12/09/23 1319  BP: 138/62  Pulse: 81  Resp: 18  SpO2: 96%   Filed Weights   12/09/23 1319  Weight: 229 lb 12.8 oz (104.2 kg)    Relevant data reviewed during this visit included CBC and CMP

## 2023-12-09 NOTE — Assessment & Plan Note (Addendum)
 He has recurrent multiple myeloma, IgG kappa subtype, standard risk, well-maintained on current treatment with pomalidomide and Ninlaro.  He is taper off dexamethasone He remains on Xarelto for history of recurrent clots as well as DVT prophylaxis He will continue acyclovir He is on calcium and vitamin D He is due for Zometa infusion next month but due to his travel, we will defer that until May I will continue treatment without changes

## 2023-12-10 LAB — KAPPA/LAMBDA LIGHT CHAINS
Kappa free light chain: 44.7 mg/L — ABNORMAL HIGH (ref 3.3–19.4)
Kappa, lambda light chain ratio: 2.17 — ABNORMAL HIGH (ref 0.26–1.65)
Lambda free light chains: 20.6 mg/L (ref 5.7–26.3)

## 2023-12-12 LAB — MULTIPLE MYELOMA PANEL, SERUM
Albumin SerPl Elph-Mcnc: 3.8 g/dL (ref 2.9–4.4)
Albumin/Glob SerPl: 1.3 (ref 0.7–1.7)
Alpha 1: 0.2 g/dL (ref 0.0–0.4)
Alpha2 Glob SerPl Elph-Mcnc: 0.8 g/dL (ref 0.4–1.0)
B-Globulin SerPl Elph-Mcnc: 0.8 g/dL (ref 0.7–1.3)
Gamma Glob SerPl Elph-Mcnc: 1.3 g/dL (ref 0.4–1.8)
Globulin, Total: 3.1 g/dL (ref 2.2–3.9)
IgA: 158 mg/dL (ref 61–437)
IgG (Immunoglobin G), Serum: 1355 mg/dL (ref 603–1613)
IgM (Immunoglobulin M), Srm: 50 mg/dL (ref 15–143)
M Protein SerPl Elph-Mcnc: 0.5 g/dL — ABNORMAL HIGH
Total Protein ELP: 6.9 g/dL (ref 6.0–8.5)

## 2023-12-14 ENCOUNTER — Telehealth: Payer: Self-pay

## 2023-12-14 ENCOUNTER — Other Ambulatory Visit (HOSPITAL_COMMUNITY): Payer: Self-pay

## 2023-12-14 ENCOUNTER — Other Ambulatory Visit: Payer: Self-pay

## 2023-12-14 NOTE — Telephone Encounter (Signed)
Called and left below message. Ask him to call the office with questions.

## 2023-12-14 NOTE — Telephone Encounter (Signed)
-----   Message from Nurse Lorra Hals sent at 12/13/2023  5:50 PM EDT ----- Regarding: Please call results  ----- Message ----- From: Artis Delay, MD Sent: 12/13/2023   2:49 PM EDT To: Tonny Bollman, RN  His myeloma panel is stable, call him and let him know

## 2023-12-15 ENCOUNTER — Other Ambulatory Visit: Payer: Self-pay | Admitting: Hematology and Oncology

## 2023-12-15 DIAGNOSIS — C9000 Multiple myeloma not having achieved remission: Secondary | ICD-10-CM

## 2023-12-15 NOTE — Telephone Encounter (Signed)
Pls refill electronically °

## 2023-12-16 ENCOUNTER — Ambulatory Visit: Payer: Medicare Other | Admitting: Hematology and Oncology

## 2023-12-16 ENCOUNTER — Ambulatory Visit: Payer: Medicare Other

## 2023-12-16 ENCOUNTER — Telehealth: Payer: Self-pay

## 2023-12-16 ENCOUNTER — Other Ambulatory Visit: Payer: Medicare Other

## 2023-12-16 ENCOUNTER — Other Ambulatory Visit (HOSPITAL_COMMUNITY): Payer: Self-pay

## 2023-12-16 ENCOUNTER — Other Ambulatory Visit: Payer: Self-pay | Admitting: Hematology and Oncology

## 2023-12-16 MED ORDER — OXYCODONE HCL 5 MG PO TABS
5.0000 mg | ORAL_TABLET | ORAL | 0 refills | Status: DC | PRN
  Filled 2023-12-16: qty 60, 10d supply, fill #0

## 2023-12-16 NOTE — Telephone Encounter (Signed)
 He called and left a message requesting oxycodone refill to [facility] pharmacy. He has enough to last for the weekend.

## 2023-12-16 NOTE — Telephone Encounter (Signed)
Called and told Rx sent. He verbalized understanding.

## 2023-12-16 NOTE — Telephone Encounter (Signed)
 done

## 2023-12-17 ENCOUNTER — Other Ambulatory Visit: Payer: Self-pay

## 2023-12-17 DIAGNOSIS — C9000 Multiple myeloma not having achieved remission: Secondary | ICD-10-CM

## 2023-12-17 MED ORDER — POMALIDOMIDE 3 MG PO CAPS: 3.0000 mg | ORAL_CAPSULE | Freq: Every day | ORAL | 0 refills | Status: DC

## 2023-12-20 ENCOUNTER — Other Ambulatory Visit (HOSPITAL_COMMUNITY): Payer: Self-pay

## 2024-01-04 ENCOUNTER — Ambulatory Visit: Payer: Medicare Other

## 2024-01-04 VITALS — BP 126/62 | HR 61 | Temp 97.4°F | Ht 69.0 in | Wt 228.8 lb

## 2024-01-04 DIAGNOSIS — Z0000 Encounter for general adult medical examination without abnormal findings: Secondary | ICD-10-CM | POA: Diagnosis not present

## 2024-01-04 NOTE — Progress Notes (Signed)
 Subjective:   Jesse Mcclure. is a 73 y.o. who presents for a Medicare Wellness preventive visit.  Visit Complete: In person    Persons Participating in Visit: Patient.  AWV Questionnaire: Yes: Patient Medicare AWV questionnaire was completed by the patient on 12/31/2023; I have confirmed that all information answered by patient is correct and no changes since this date.  Cardiac Risk Factors include: advanced age (>72men, >79 women);dyslipidemia;hypertension;male gender     Objective:    Today's Vitals   01/04/24 1328 01/04/24 1329  BP: 126/62   Pulse: 61   Temp: (!) 97.4 F (36.3 C)   TempSrc: Oral   SpO2: 95%   Weight: 228 lb 12.8 oz (103.8 kg)   Height: 5\' 9"  (1.753 m)   PainSc:  7    Body mass index is 33.79 kg/m.     01/04/2024    1:34 PM 10/25/2023   11:34 AM 05/16/2023    7:45 PM 04/29/2023   12:29 PM 12/29/2022    9:16 AM 01/06/2022    1:55 PM 12/26/2021    2:41 PM  Advanced Directives  Does Patient Have a Medical Advance Directive? Yes Yes No No Yes Yes Yes  Type of Estate agent of [place];Living will Healthcare Power of [place];Living will   Healthcare Power of [place];Living will  Healthcare Power of [place];Living will  Does patient want to make changes to medical advance directive?      No - Patient declined   Copy of Healthcare Power of Attorney in Chart? Yes - validated most recent copy scanned in chart (See row information)    Yes - validated most recent copy scanned in chart (See row information)  No - copy requested  Would patient like information on creating a medical advance directive?   No - Patient declined No - Patient declined       Current Medications (verified) Outpatient Encounter Medications as of 01/04/2024  Medication Sig   acyclovir (ZOVIRAX) 400 MG tablet Take 1 tablet (400 mg total) by mouth daily.   atorvastatin (LIPITOR) 20 MG tablet Take 1 tablet (20 mg total) by mouth daily.   calcium carbonate (TUMS -  DOSED IN MG ELEMENTAL CALCIUM) 500 MG chewable tablet Chew 1 tablet by mouth 2 (two) times daily.   Cholecalciferol (VITAMIN D) 50 MCG (2000 UT) tablet Take 2,000 Units by mouth daily.   empagliflozin (JARDIANCE) 10 MG TABS tablet Take 1 tablet (10 mg total) by mouth daily.   folic acid (FOLVITE) 400 MCG tablet Take 400 mcg by mouth daily.   furosemide (LASIX) 40 MG tablet Take 1 tablet (40 mg total) by mouth daily.   ixazomib citrate (NINLARO) 3 MG capsule Take 1 capsule (3 mg) by mouth weekly, 3 weeks on, 1 week off, repeat every 4 weeks. Take on an empty stomach 1hr before or 2hr after meals.   losartan (COZAAR) 100 MG tablet Take 1 tablet (100 mg total) by mouth daily.   Multiple Vitamin (MULTI-VITAMIN) tablet Take 1 tablet by mouth daily.   oxyCODONE (OXY IR/ROXICODONE) 5 MG immediate release tablet Take 1 tablet (5 mg total) by mouth every 4 (four) hours as needed for severe pain (pain score 7-10).   Polyethyl Glycol-Propyl Glycol (SYSTANE OP) Place 1 drop into both eyes daily as needed (dry eyes).   pomalidomide (POMALYST) 3 MG capsule Take 1 capsule (3 mg total) by mouth daily. TAKE 1 CAPSULE BY MOUTH 1 TIME A DAY FOR 21 DAYS ON AND 7  DAYS OFF/ Repeat every 28 days. [employer] Auth # [id number],  Date Obtained 12/17/23,   primidone (MYSOLINE) 50 MG tablet Take 1 tablet (50 mg total) by mouth at bedtime.   prochlorperazine (COMPAZINE) 10 MG tablet TAKE 1 TABLET(10 MG) BY MOUTH EVERY 6 HOURS AS NEEDED FOR NAUSEA OR VOMITING   rivaroxaban (XARELTO) 10 MG TABS tablet Take 1 tablet (10 mg total) by mouth daily with supper.   vitamin B-12 (CYANOCOBALAMIN) 1000 MCG tablet Take 1,000 mcg by mouth daily.   No facility-administered encounter medications on file as of 01/04/2024.    Allergies (verified) Patient has no known allergies.   History: Past Medical History:  Diagnosis Date   BPH (benign prostatic hypertrophy)    Degenerative disc disease    Diverticulosis    Hemochromatosis    HTN  (hypertension)    Hyperlipidemia    Multiple myeloma (HCC)    Polio    Status post Nissen fundoplication (without gastrostomy tube) procedure    for hiatal hernia.   Past Surgical History:  Procedure Laterality Date   FOOT SURGERY     HIATAL HERNIA REPAIR     OTHER SURGICAL HISTORY     Cataract Surgery--Both eyes   SKIN BIOPSY Right 04/13/2022   squamous cell carcinoma in stiu arising in actinic keratosis   TOTAL KNEE ARTHROPLASTY Left 07/15/2021   Procedure: TOTAL KNEE ARTHROPLASTY;  Surgeon: Teryl Lucy, MD;  Location: [facility];  Service: Orthopedics;  Laterality: Left;   Undescended testes Right 10/05/1966   Family History  Problem Relation Age of Onset   Breast cancer Mother    Arrhythmia Father    Heart failure Father    Breast cancer Sister    Social History   Socioeconomic History   Marital status: Single    Spouse name: Not on file   Number of children: Not on file   Years of education: Not on file   Highest education level: 12th grade  Occupational History   Not on file  Tobacco Use   Smoking status: Never   Smokeless tobacco: Never  Vaping Use   Vaping status: Never Used  Substance and Sexual Activity   Alcohol use: Not Currently    Alcohol/week: 4.0 standard drinks of alcohol    Types: 4 drink(s) per week    Comment: occasionally   Drug use: Yes    Types: Oxycodone   Sexual activity: Yes  Other Topics Concern   Not on file  Social History Narrative   Not on file   Social Drivers of Health   Financial Resource Strain: Low Risk  (12/31/2023)   Overall Financial Resource Strain (CARDIA)    Difficulty of Paying Living Expenses: Not hard at all  Food Insecurity: No Food Insecurity (12/31/2023)   Hunger Vital Sign    Worried About Running Out of Food in the Last Year: Never true    Ran Out of Food in the Last Year: Never true  Transportation Needs: No Transportation Needs (12/31/2023)   PRAPARE - Administrator, Civil Service (Medical):  No    Lack of Transportation (Non-Medical): No  Physical Activity: Insufficiently Active (12/31/2023)   Exercise Vital Sign    Days of Exercise per Week: 2 days    Minutes of Exercise per Session: 30 min  Stress: No Stress Concern Present (12/31/2023)   [employer] of Occupational Health - Occupational Stress Questionnaire    Feeling of Stress : Only a little  Social Connections: Socially Integrated (12/31/2023)  Social Advertising account executive [NHANES]    Frequency of Communication with Friends and Family: Twice a week    Frequency of Social Gatherings with Friends and Family: Once a week    Attends Religious Services: 1 to 4 times per year    Active Member of [employer] or Organizations: Yes    Attends Banker Meetings: Never    Marital Status: Living with partner    Tobacco Counseling Counseling given: Not Answered    Clinical Intake:  Pre-visit preparation completed: Yes  Pain : 0-10 Pain Score: 7  Pain Type: Chronic pain Pain Location: Back Pain Orientation: Lower Pain Descriptors / Indicators: Aching Pain Onset: More than a month ago Pain Frequency: Constant     Nutritional Status: BMI > 30  Obese Nutritional Risks: None Diabetes: No  Lab Results  Component Value Date   HGBA1C 6.0 (A) 05/12/2023   HGBA1C 5.0 09/16/2021   HGBA1C 5.1 05/20/2021     How often do you need to have someone help you when you read instructions, pamphlets, or other written materials from your doctor or pharmacy?: 1 - Never  Interpreter Needed?: No  Information entered by :: NAllen LPN   Activities of Daily Living     12/31/2023    1:28 PM 05/17/2023   12:26 AM  In your present state of health, do you have any difficulty performing the following activities:  Hearing? 0 0  Vision? 0 0  Difficulty concentrating or making decisions? 0 0  Walking or climbing stairs? 1 1  Comment due to the knee   Dressing or bathing? 0 0  Doing errands, shopping? 0 0   Preparing Food and eating ? N   Using the Toilet? N   In the past six months, have you accidently leaked urine? N   Do you have problems with loss of bowel control? N   Managing your Medications? N   Managing your Finances? N   Housekeeping or managing your Housekeeping? N     Patient Care Team: Ronnald Nian, MD as PCP - General (Family Medicine) Maisie Fus, MD as PCP - Cardiology (Cardiology) Artis Delay, MD as Consulting Physician (Hematology and Oncology) Kennon Rounds as Physician Assistant (Cardiology)  Indicate any recent Medical Services you may have received from other than [facility] providers in the past year (date may be approximate).     Assessment:   This is a routine wellness examination for Krystian.  Hearing/Vision screen Hearing Screening - Comments:: Denies hearing issues Vision Screening - Comments:: Regular eye exams, [facility]   Goals Addressed             This Visit's Progress    Patient Stated       01/04/2024, continue exercise       Depression Screen     01/04/2024    1:35 PM 12/29/2022    9:16 AM 12/26/2021    2:42 PM 03/17/2021   10:13 AM 10/11/2018   10:42 AM 08/24/2018   10:12 AM 07/29/2017    9:15 AM  PHQ 2/9 Scores  PHQ - 2 Score 0 0 0 0 0 0 0  PHQ- 9 Score 0 0         Fall Risk     12/31/2023    1:28 PM 12/29/2022    9:16 AM 12/26/2021    2:42 PM 12/24/2021    3:13 PM 09/22/2021    2:15 PM  Fall Risk   Falls in  the past year? 0 0 0 0 0  Number falls in past yr: 0 0  0 0  Injury with Fall? 0 0  0 0  Risk for fall due to : Medication side effect Medication side effect Medication side effect  No Fall Risks  Follow up Falls prevention discussed;Falls evaluation completed Falls prevention discussed;Education provided;Falls evaluation completed Falls evaluation completed;Education provided;Falls prevention discussed  Falls evaluation completed    MEDICARE RISK AT HOME:  Medicare Risk at Home Any stairs in or around the  home?: (Patient-Rptd) Yes If so, are there any without handrails?: (Patient-Rptd) No Home free of loose throw rugs in walkways, pet beds, electrical cords, etc?: (Patient-Rptd) No Adequate lighting in your home to reduce risk of falls?: (Patient-Rptd) Yes Life alert?: (Patient-Rptd) No Use of a cane, walker or w/c?: (Patient-Rptd) No Grab bars in the bathroom?: (Patient-Rptd) Yes Shower chair or bench in shower?: (Patient-Rptd) No Elevated toilet seat or a handicapped toilet?: (Patient-Rptd) Yes  TIMED UP AND GO:  Was the test performed?  No  Cognitive Function: Normal: Normal cognitive status assessed by direct observation by this Clinical Health Advisor. No abnormalities found. Patient is able to answer questions in an accurate and timely manner.        12/29/2022    9:18 AM 12/26/2021    2:43 PM  6CIT Screen  What Year? 0 points 0 points  What month? 0 points 0 points  What time? 0 points 0 points  Count back from 20 0 points 0 points  Months in reverse 4 points 4 points  Repeat phrase 2 points 2 points  Total Score 6 points 6 points    Immunizations Immunization History  Administered Date(s) Administered   DTaP / IPV 02/14/2020, 05/16/2020, 08/27/2020   Fluad Quad(high Dose 65+) 05/24/2019, 07/16/2021, 07/09/2022   HIB (PRP-T) 02/14/2020, 05/16/2020, 08/27/2020   Hepatitis B, PED/ADOLESCENT 02/14/2020, 05/16/2020, 11/28/2020   Influenza Split 07/05/2012   Influenza Whole 07/11/2013, 07/31/2016   Influenza, High Dose Seasonal PF 07/29/2017, 06/27/2018, 05/24/2019   Influenza,inj,Quad PF,6+ Mos 05/22/2014   Influenza-Unspecified 05/22/2014, 08/11/2017, 05/24/2019   MMR 09/15/2021   PFIZER(Purple Top)SARS-COV-2 Vaccination 12/12/2019, 01/02/2020   Pneumococcal Conjugate-13 07/29/2017, 02/14/2020, 05/16/2020, 08/27/2020   Pneumococcal Polysaccharide-23 10/11/2018, 11/28/2020   Tdap 10/28/2012   Zoster Recombinant(Shingrix) 08/11/2017, 08/14/2017, 07/06/2018, 07/06/2018    Zoster, Live 05/16/2014    Screening Tests Health Maintenance  Topic Date Due   MAMMOGRAM  Never done   DEXA SCAN  Never done   COVID-19 Vaccine (3 - Pfizer risk series) 01/30/2020   INFLUENZA VACCINE  05/05/2024   Colonoscopy  10/29/2024   Medicare Annual Wellness (AWV)  01/03/2025   DTaP/Tdap/Td (5 - Td or Tdap) 08/27/2030   Pneumonia Vaccine 28+ Years old  Completed   Hepatitis C Screening  Completed   Zoster Vaccines- Shingrix  Completed   HPV VACCINES  Aged Out    Health Maintenance  Health Maintenance Due  Topic Date Due   MAMMOGRAM  Never done   DEXA SCAN  Never done   COVID-19 Vaccine (3 - Pfizer risk series) 01/30/2020   Health Maintenance Items Addressed: Declines covid vacine  Additional Screening:  Vision Screening: Recommended annual ophthalmology exams for early detection of glaucoma and other disorders of the eye.  Dental Screening: Recommended annual dental exams for proper oral hygiene  Community Resource Referral / Chronic Care Management: CRR required this visit?  No   CCM required this visit?  No     Plan:  I have personally reviewed and noted the following in the patient's chart:   Medical and social history Use of alcohol, tobacco or illicit drugs  Current medications and supplements including opioid prescriptions. Patient is currently taking opioid prescriptions. Information provided to patient regarding non-opioid alternatives. Patient advised to discuss non-opioid treatment plan with their provider. Functional ability and status Nutritional status Physical activity Advanced directives List of other physicians Hospitalizations, surgeries, and ER visits in previous 12 months Vitals Screenings to include cognitive, depression, and falls Referrals and appointments  In addition, I have reviewed and discussed with patient certain preventive protocols, quality metrics, and best practice recommendations. A written personalized care  plan for preventive services as well as general preventive health recommendations were provided to patient.     Barb Merino, LPN   10/10/1094   After Visit Summary: (In Person-Declined) Patient declined AVS at this time.  Notes: Nothing significant to report at this time.

## 2024-01-04 NOTE — Patient Instructions (Signed)
 [name] , Thank you for taking time to come for your Medicare Wellness Visit. I appreciate your ongoing commitment to your health goals. Please review the following plan we discussed and let me know if I can assist you in the future.   Referrals/Orders/Follow-Ups/Clinician Recommendations: none  Managing Pain Without Opioids Opioids are strong medicines used to treat moderate to severe pain. For some people, especially those who have long-term (chronic) pain, opioids may not be the best choice for pain management due to: Side effects like nausea, constipation, and sleepiness. The risk of addiction (opioid use disorder). The longer you take opioids, the greater your risk of addiction. Pain that lasts for more than 3 months is called chronic pain. Managing chronic pain usually requires more than one approach and is often provided by a team of health care providers working together (multidisciplinary approach). Pain management may be done at a pain management center or pain clinic. How to manage pain without the use of opioids Use non-opioid medicines Non-opioid medicines for pain may include: Over-the-counter or prescription non-steroidal anti-inflammatory drugs (NSAIDs). These may be the first medicines used for pain. They work well for muscle and bone pain, and they reduce swelling. Acetaminophen. This over-the-counter medicine may work well for milder pain but not swelling. Antidepressants. These may be used to treat chronic pain. A certain type of antidepressant (tricyclics) is often used. These medicines are given in lower doses for pain than when used for depression. Anticonvulsants. These are usually used to treat seizures but may also reduce nerve (neuropathic) pain. Muscle relaxants. These relieve pain caused by sudden muscle tightening (spasms). You may also use a pain medicine that is applied to the skin as a patch, cream, or gel (topical analgesic), such as a numbing medicine. These may  cause fewer side effects than medicines taken by mouth. Do certain therapies as directed Some therapies can help with pain management. They include: Physical therapy. You will do exercises to gain strength and flexibility. A physical therapist may teach you exercises to move and stretch parts of your body that are weak, stiff, or painful. You can learn these exercises at physical therapy visits and practice them at home. Physical therapy may also involve: Massage. Heat wraps or applying heat or cold to affected areas. Electrical signals that interrupt pain signals (transcutaneous electrical nerve stimulation, TENS). Weak lasers that reduce pain and swelling (low-level laser therapy). Signals from your body that help you learn to regulate pain (biofeedback). Occupational therapy. This helps you to learn ways to function at home and work with less pain. Recreational therapy. This involves trying new activities or hobbies, such as a physical activity or drawing. Mental health therapy, including: Cognitive behavioral therapy (CBT). This helps you learn coping skills for dealing with pain. Acceptance and commitment therapy (ACT) to change the way you think and react to pain. Relaxation therapies, including muscle relaxation exercises and mindfulness-based stress reduction. Pain management counseling. This may be individual, family, or group counseling.  Receive medical treatments Medical treatments for pain management include: Nerve block injections. These may include a pain blocker and anti-inflammatory medicines. You may have injections: Near the spine to relieve chronic back or neck pain. Into joints to relieve back or joint pain. Into nerve areas that supply a painful area to relieve body pain. Into muscles (trigger point injections) to relieve some painful muscle conditions. A medical device placed near your spine to help block pain signals and relieve nerve pain or chronic back pain (spinal  cord stimulation device). Acupuncture. Follow these instructions at home Medicines Take over-the-counter and prescription medicines only as told by your health care provider. If you are taking pain medicine, ask your health care providers about possible side effects to watch out for. Do not drive or use heavy machinery while taking prescription opioid pain medicine. Lifestyle  Do not use drugs or alcohol to reduce pain. If you drink alcohol, limit how much you have to: 0-1 drink a day for women who are not pregnant. 0-2 drinks a day for men. Know how much alcohol is in a drink. In the [place] one drink equals one 12 oz bottle of beer (355 mL), one 5 oz glass of wine (148 mL), or one 1 oz glass of hard liquor (44 mL). Do not use any products that contain nicotine or tobacco. These products include cigarettes, chewing tobacco, and vaping devices, such as e-cigarettes. If you need help quitting, ask your health care provider. Eat a healthy diet and maintain a healthy weight. Poor diet and excess weight may make pain worse. Eat foods that are high in fiber. These include fresh fruits and vegetables, whole grains, and beans. Limit foods that are high in fat and processed sugars, such as fried and sweet foods. Exercise regularly. Exercise lowers stress and may help relieve pain. Ask your health care provider what activities and exercises are safe for you. If your health care provider approves, join an exercise class that combines movement and stress reduction. Examples include yoga and tai chi. Get enough sleep. Lack of sleep may make pain worse. Lower stress as much as possible. Practice stress reduction techniques as told by your therapist. General instructions Work with all your pain management providers to find the treatments that work best for you. You are an important member of your pain management team. There are many things you can do to reduce pain on your own. Consider joining an online or  in-person support group for people who have chronic pain. Keep all follow-up visits. This is important. Where to find more information You can find more information about managing pain without opioids from: American Academy of Pain Medicine: painmed.org Institute for Chronic Pain: instituteforchronicpain.org American Chronic Pain Association: theacpa.org Contact a health care provider if: You have side effects from pain medicine. Your pain gets worse or does not get better with treatments or home therapy. You are struggling with anxiety or depression. Summary Many types of pain can be managed without opioids. Chronic pain may respond better to pain management without opioids. Pain is best managed when you and a team of health care providers work together. Pain management without opioids may include non-opioid medicines, medical treatments, physical therapy, mental health therapy, and lifestyle changes. Tell your health care providers if your pain gets worse or is not being managed well enough. This information is not intended to replace advice given to you by your health care provider. Make sure you discuss any questions you have with your health care provider. Document Revised: 01/01/2021 Document Reviewed: 01/01/2021 Elsevier Patient Education  2024 Elsevier Inc.  This is a list of the screening recommended for you and due dates:  Health Maintenance  Topic Date Due   Mammogram  Never done   DEXA scan (bone density measurement)  Never done   COVID-19 Vaccine (3 - Pfizer risk series) 01/30/2020   Flu Shot  05/05/2024   Colon Cancer Screening  10/29/2024   Medicare Annual Wellness Visit  01/03/2025   DTaP/Tdap/Td vaccine (5 -  Td or Tdap) 08/27/2030   Pneumonia Vaccine  Completed   Hepatitis C Screening  Completed   Zoster (Shingles) Vaccine  Completed   HPV Vaccine  Aged Out    Advanced directives: (In Chart) A copy of your advanced directives are scanned into your chart should  your provider ever need it.  Next Medicare Annual Wellness Visit scheduled for next year: Yes  insert Preventive Care attachment Insert FALL PREVENTION attachment if needed

## 2024-01-05 ENCOUNTER — Other Ambulatory Visit (HOSPITAL_COMMUNITY): Payer: Self-pay

## 2024-01-05 ENCOUNTER — Other Ambulatory Visit: Payer: Self-pay | Admitting: Hematology and Oncology

## 2024-01-05 ENCOUNTER — Telehealth: Payer: Self-pay

## 2024-01-05 MED ORDER — OXYCODONE HCL 5 MG PO TABS
5.0000 mg | ORAL_TABLET | ORAL | 0 refills | Status: DC | PRN
Start: 2024-01-05 — End: 2024-01-19
  Filled 2024-01-05: qty 60, 10d supply, fill #0

## 2024-01-05 NOTE — Telephone Encounter (Signed)
 He called and left a message requesting oxycodone refill to [facility].

## 2024-01-05 NOTE — Telephone Encounter (Signed)
 I refilled

## 2024-01-05 NOTE — Telephone Encounter (Signed)
 Called and left a message Rx sent.

## 2024-01-07 ENCOUNTER — Other Ambulatory Visit (HOSPITAL_COMMUNITY): Payer: Self-pay

## 2024-01-13 ENCOUNTER — Other Ambulatory Visit: Payer: Self-pay | Admitting: Hematology and Oncology

## 2024-01-13 DIAGNOSIS — C9000 Multiple myeloma not having achieved remission: Secondary | ICD-10-CM

## 2024-01-19 ENCOUNTER — Other Ambulatory Visit: Payer: Self-pay | Admitting: *Deleted

## 2024-01-19 ENCOUNTER — Other Ambulatory Visit (HOSPITAL_COMMUNITY): Payer: Self-pay

## 2024-01-19 DIAGNOSIS — C9000 Multiple myeloma not having achieved remission: Secondary | ICD-10-CM

## 2024-01-19 MED ORDER — OXYCODONE HCL 5 MG PO TABS
5.0000 mg | ORAL_TABLET | ORAL | 0 refills | Status: DC | PRN
  Filled 2024-01-19: qty 60, 10d supply, fill #0

## 2024-01-19 NOTE — Telephone Encounter (Signed)
 Patient called to request refill of Roxycodone.  Refill routed to K. Kathlee Pap, PA in Dr. Camilla Cedar absence

## 2024-01-19 NOTE — Telephone Encounter (Signed)
 Refill sent to patient's pharmacy per request. I have reviewed the PDMP during this encounter.

## 2024-01-20 ENCOUNTER — Other Ambulatory Visit (HOSPITAL_COMMUNITY): Payer: Self-pay

## 2024-01-20 ENCOUNTER — Other Ambulatory Visit: Payer: Self-pay

## 2024-02-07 ENCOUNTER — Telehealth: Payer: Self-pay

## 2024-02-07 NOTE — Telephone Encounter (Signed)
 Returned his call. He is asking for a refill on oxycodone  to [facility]. He is leaving on vacation on 5/15 for 2 weeks and asking for increased amount of tabs to last until he comes home. He has enough to last until tomorrow.

## 2024-02-08 ENCOUNTER — Encounter: Payer: Self-pay | Admitting: Hematology and Oncology

## 2024-02-08 ENCOUNTER — Other Ambulatory Visit (HOSPITAL_COMMUNITY): Payer: Self-pay

## 2024-02-08 ENCOUNTER — Other Ambulatory Visit: Payer: Self-pay | Admitting: Hematology and Oncology

## 2024-02-08 DIAGNOSIS — C9000 Multiple myeloma not having achieved remission: Secondary | ICD-10-CM

## 2024-02-08 MED ORDER — OXYCODONE HCL 5 MG PO TABS
5.0000 mg | ORAL_TABLET | ORAL | 0 refills | Status: DC | PRN
  Filled 2024-02-08: qty 90, 15d supply, fill #0

## 2024-02-08 NOTE — Telephone Encounter (Signed)
 Called and given below message Rx sent. He verbalized understanding.

## 2024-02-08 NOTE — Telephone Encounter (Signed)
 Sent 90 tabs to [facility]

## 2024-02-15 ENCOUNTER — Other Ambulatory Visit: Payer: Self-pay | Admitting: Family Medicine

## 2024-02-15 DIAGNOSIS — E785 Hyperlipidemia, unspecified: Secondary | ICD-10-CM

## 2024-02-17 ENCOUNTER — Other Ambulatory Visit: Payer: Self-pay | Admitting: Hematology and Oncology

## 2024-02-17 DIAGNOSIS — C9000 Multiple myeloma not having achieved remission: Secondary | ICD-10-CM

## 2024-02-24 ENCOUNTER — Encounter: Payer: Self-pay | Admitting: Internal Medicine

## 2024-03-02 ENCOUNTER — Inpatient Hospital Stay: Attending: Hematology and Oncology

## 2024-03-02 ENCOUNTER — Inpatient Hospital Stay

## 2024-03-02 ENCOUNTER — Encounter: Payer: Self-pay | Admitting: Hematology and Oncology

## 2024-03-02 ENCOUNTER — Inpatient Hospital Stay (HOSPITAL_BASED_OUTPATIENT_CLINIC_OR_DEPARTMENT_OTHER): Admitting: Hematology and Oncology

## 2024-03-02 ENCOUNTER — Other Ambulatory Visit (HOSPITAL_COMMUNITY): Payer: Self-pay

## 2024-03-02 VITALS — BP 147/83 | HR 73 | Temp 99.6°F | Resp 18 | Ht 69.0 in | Wt 233.0 lb

## 2024-03-02 DIAGNOSIS — C9002 Multiple myeloma in relapse: Secondary | ICD-10-CM | POA: Insufficient documentation

## 2024-03-02 DIAGNOSIS — G8929 Other chronic pain: Secondary | ICD-10-CM

## 2024-03-02 DIAGNOSIS — M7918 Myalgia, other site: Secondary | ICD-10-CM | POA: Diagnosis not present

## 2024-03-02 DIAGNOSIS — E669 Obesity, unspecified: Secondary | ICD-10-CM

## 2024-03-02 DIAGNOSIS — C9000 Multiple myeloma not having achieved remission: Secondary | ICD-10-CM

## 2024-03-02 LAB — COMPREHENSIVE METABOLIC PANEL WITH GFR
ALT: 22 U/L (ref 0–44)
AST: 19 U/L (ref 15–41)
Albumin: 3.9 g/dL (ref 3.5–5.0)
Alkaline Phosphatase: 60 U/L (ref 38–126)
Anion gap: 6 (ref 5–15)
BUN: 20 mg/dL (ref 8–23)
CO2: 26 mmol/L (ref 22–32)
Calcium: 9.1 mg/dL (ref 8.9–10.3)
Chloride: 108 mmol/L (ref 98–111)
Creatinine, Ser: 1 mg/dL (ref 0.61–1.24)
GFR, Estimated: >60 mL/min (ref >60)
Glucose, Bld: 99 mg/dL (ref 70–99)
Potassium: 3.9 mmol/L (ref 3.5–5.1)
Sodium: 140 mmol/L (ref 135–145)
Total Bilirubin: 1.1 mg/dL (ref 0.0–1.2)
Total Protein: 6.9 g/dL (ref 6.5–8.1)

## 2024-03-02 LAB — CBC WITH DIFFERENTIAL/PLATELET
Abs Immature Granulocytes: 0 10*3/uL (ref 0.00–0.07)
Basophils Absolute: 0 10*3/uL (ref 0.0–0.1)
Basophils Relative: 1 %
Eosinophils Absolute: 0.1 10*3/uL (ref 0.0–0.5)
Eosinophils Relative: 2 %
HCT: 44.9 % (ref 39.0–52.0)
Hemoglobin: 15.4 g/dL (ref 13.0–17.0)
Immature Granulocytes: 0 %
Lymphocytes Relative: 35 %
Lymphs Abs: 1.4 10*3/uL (ref 0.7–4.0)
MCH: 34.3 pg — ABNORMAL HIGH (ref 26.0–34.0)
MCHC: 34.3 g/dL (ref 30.0–36.0)
MCV: 100 fL (ref 80.0–100.0)
Monocytes Absolute: 0.8 10*3/uL (ref 0.1–1.0)
Monocytes Relative: 19 %
Neutro Abs: 1.7 10*3/uL (ref 1.7–7.7)
Neutrophils Relative %: 43 %
Platelets: 225 10*3/uL (ref 150–400)
RBC: 4.49 MIL/uL (ref 4.22–5.81)
RDW: 14.8 % (ref 11.5–15.5)
WBC: 4 10*3/uL (ref 4.0–10.5)
nRBC: 0 % (ref 0.0–0.2)

## 2024-03-02 MED ORDER — SODIUM CHLORIDE 0.9 % IV SOLN: Freq: Once | INTRAVENOUS | Status: AC

## 2024-03-02 MED ORDER — OXYCODONE HCL 5 MG PO TABS
5.0000 mg | ORAL_TABLET | ORAL | 0 refills | Status: DC | PRN
  Filled 2024-03-02: qty 90, 15d supply, fill #0

## 2024-03-02 MED ORDER — ZOLEDRONIC ACID 4 MG/100ML IV SOLN
4.0000 mg | Freq: Once | INTRAVENOUS | Status: AC
  Administered 2024-03-02: 4 mg via INTRAVENOUS
  Filled 2024-03-02: qty 100

## 2024-03-02 NOTE — Assessment & Plan Note (Addendum)
 Previously, we discussed the importance of weight loss He is highly motivated but has gained weight due to recent holidays I encourage the patient to continue his best effort to lose weight

## 2024-03-02 NOTE — Assessment & Plan Note (Addendum)
I refilled his prescription medication to take as needed

## 2024-03-02 NOTE — Assessment & Plan Note (Addendum)
 He has recurrent multiple myeloma, IgG kappa subtype, standard risk, well-maintained on current treatment with pomalidomide  and Ninlaro .  He is taper off dexamethasone  He remains on Xarelto  for history of recurrent clots as well as DVT prophylaxis He will continue acyclovir  He is on calcium  and vitamin D  He is due for Zometa  today I will continue treatment without changes

## 2024-03-02 NOTE — Patient Instructions (Signed)

## 2024-03-02 NOTE — Progress Notes (Signed)
 [facility] OFFICE PROGRESS NOTE  Patient Care Team: Watson Hacking, MD as PCP - General (Family Medicine) Amanda Jungling Tomas Fountain, MD as PCP - Cardiology (Cardiology) Almeda Jacobs, MD as Consulting Physician (Hematology and Oncology) Sherwood Donath as Physician Assistant (Cardiology)  Assessment & Plan Multiple myeloma without remission [facility]) He has recurrent multiple myeloma, IgG kappa subtype, standard risk, well-maintained on current treatment with pomalidomide  and Ninlaro .  He is taper off dexamethasone  He remains on Xarelto  for history of recurrent clots as well as DVT prophylaxis He will continue acyclovir  He is on calcium  and vitamin D  He is due for Zometa  today I will continue treatment without changes Obesity (BMI 30-39.9) Previously, we discussed the importance of weight loss He is highly motivated but has gained weight due to recent holidays I encourage the patient to continue his best effort to lose weight Chronic musculoskeletal pain I refilled his prescription medication to take as needed  No orders of the defined types were placed in this encounter.    Almeda Jacobs, MD  INTERVAL HISTORY: he returns for treatment follow-up Complications related to previous cycle of chemotherapy included bone aches,  PHYSICAL EXAMINATION: ECOG PERFORMANCE STATUS: 0 - Asymptomatic  No results found for: "CAN125"    Latest Ref Rng & Units 03/02/2024   12:07 PM 12/09/2023    1:00 PM 10/19/2023   10:47 AM  CBC  WBC 4.0 - 10.5 K/uL 4.0  4.8  5.0   Hemoglobin 13.0 - 17.0 g/dL 30.8  65.7  84.6   Hematocrit 39.0 - 52.0 % 44.9  47.9  44.0   Platelets 150 - 400 K/uL 225  216  251       Chemistry      Component Value Date/Time   NA 140 03/02/2024 1207   NA 139 06/01/2023 1115   NA 139 04/05/2014 0849   K 3.9 03/02/2024 1207   K 4.4 04/05/2014 0849   CL 108 03/02/2024 1207   CO2 26 03/02/2024 1207   CO2 23 04/05/2014 0849   BUN 20 03/02/2024 1207   BUN 26 06/01/2023  1115   BUN 16.8 04/05/2014 0849   CREATININE 1.00 03/02/2024 1207   CREATININE 0.80 10/29/2022 1046   CREATININE 0.91 07/29/2017 1004   CREATININE 0.9 04/05/2014 0849   GLU 10 04/10/2014 0000      Component Value Date/Time   CALCIUM  9.1 03/02/2024 1207   CALCIUM  8.7 04/05/2014 0849   ALKPHOS 60 03/02/2024 1207   ALKPHOS 75 04/05/2014 0849   AST 19 03/02/2024 1207   AST 24 10/29/2022 1046   AST 26 04/05/2014 0849   ALT 22 03/02/2024 1207   ALT 31 10/29/2022 1046   ALT 30 04/05/2014 0849   BILITOT 1.1 03/02/2024 1207   BILITOT 0.9 06/01/2023 1115   BILITOT 0.9 10/29/2022 1046   BILITOT 0.71 04/05/2014 0849       Vitals:   03/02/24 1240  BP: (!) 147/83  Pulse: 73  Resp: 18  Temp: 99.6 F (37.6 C)  SpO2: 96%   Filed Weights   03/02/24 1240  Weight: 233 lb (105.7 kg)   Other relevant data reviewed during this visit included CBC, CMP and myeloma panel from March 2025

## 2024-03-03 LAB — KAPPA/LAMBDA LIGHT CHAINS
Kappa free light chain: 41 mg/L — ABNORMAL HIGH (ref 3.3–19.4)
Kappa, lambda light chain ratio: 2.19 — ABNORMAL HIGH (ref 0.26–1.65)
Lambda free light chains: 18.7 mg/L (ref 5.7–26.3)

## 2024-03-05 LAB — MULTIPLE MYELOMA PANEL, SERUM
Albumin SerPl Elph-Mcnc: 3.5 g/dL (ref 2.9–4.4)
Albumin/Glob SerPl: 1.3 (ref 0.7–1.7)
Alpha 1: 0.2 g/dL (ref 0.0–0.4)
Alpha2 Glob SerPl Elph-Mcnc: 0.7 g/dL (ref 0.4–1.0)
B-Globulin SerPl Elph-Mcnc: 0.9 g/dL (ref 0.7–1.3)
Gamma Glob SerPl Elph-Mcnc: 1.2 g/dL (ref 0.4–1.8)
Globulin, Total: 2.9 g/dL (ref 2.2–3.9)
IgA: 149 mg/dL (ref 61–437)
IgG (Immunoglobin G), Serum: 1318 mg/dL (ref 603–1613)
IgM (Immunoglobulin M), Srm: 43 mg/dL (ref 15–143)
M Protein SerPl Elph-Mcnc: 0.6 g/dL — ABNORMAL HIGH
Total Protein ELP: 6.4 g/dL (ref 6.0–8.5)

## 2024-03-06 ENCOUNTER — Other Ambulatory Visit: Payer: Self-pay

## 2024-03-06 ENCOUNTER — Telehealth: Payer: Self-pay

## 2024-03-06 ENCOUNTER — Other Ambulatory Visit: Payer: Self-pay | Admitting: Family Medicine

## 2024-03-06 ENCOUNTER — Other Ambulatory Visit (HOSPITAL_COMMUNITY): Payer: Self-pay

## 2024-03-06 DIAGNOSIS — E785 Hyperlipidemia, unspecified: Secondary | ICD-10-CM

## 2024-03-06 MED ORDER — ATORVASTATIN CALCIUM 20 MG PO TABS
20.0000 mg | ORAL_TABLET | Freq: Every day | ORAL | 0 refills | Status: DC
  Filled 2024-03-06 (×2): qty 90, 90d supply, fill #0

## 2024-03-06 NOTE — Telephone Encounter (Signed)
Called and given below message. He verbalized understanding. 

## 2024-03-06 NOTE — Telephone Encounter (Signed)
-----   Message from Almeda Jacobs sent at 03/06/2024  9:12 AM EDT ----- Myeloma panel is stable let him know

## 2024-03-06 NOTE — Telephone Encounter (Signed)
 Called spoke with pt and pt only wants meds to be sent to [facility] pharmacy.

## 2024-03-08 DIAGNOSIS — M1711 Unilateral primary osteoarthritis, right knee: Secondary | ICD-10-CM | POA: Diagnosis not present

## 2024-03-08 DIAGNOSIS — M25561 Pain in right knee: Secondary | ICD-10-CM | POA: Diagnosis not present

## 2024-03-13 ENCOUNTER — Telehealth: Payer: Self-pay

## 2024-03-13 ENCOUNTER — Other Ambulatory Visit: Payer: Self-pay | Admitting: Hematology and Oncology

## 2024-03-13 ENCOUNTER — Other Ambulatory Visit (HOSPITAL_COMMUNITY): Payer: Self-pay

## 2024-03-13 DIAGNOSIS — C9000 Multiple myeloma not having achieved remission: Secondary | ICD-10-CM

## 2024-03-13 MED ORDER — PROCHLORPERAZINE MALEATE 10 MG PO TABS
ORAL_TABLET | ORAL | 1 refills | Status: DC
  Filled 2024-03-13: qty 60, 20d supply, fill #0

## 2024-03-13 MED ORDER — EMPAGLIFLOZIN 10 MG PO TABS
10.0000 mg | ORAL_TABLET | Freq: Every day | ORAL | 0 refills | Status: DC
  Filled 2024-03-13: qty 30, 30d supply, fill #0

## 2024-03-13 MED FILL — Acyclovir Tab 400 MG: ORAL | 90 days supply | Qty: 90 | Fill #1 | Status: AC

## 2024-03-13 NOTE — Telephone Encounter (Signed)
 Called per Dr. Marton Sleeper, Jardiance  prescription sent today to pharmacy.  In the future please gets refill from PCP or cardiology per Dr. Marton Sleeper. Bill verbalized understanding.

## 2024-03-20 ENCOUNTER — Other Ambulatory Visit (HOSPITAL_COMMUNITY): Payer: Self-pay

## 2024-03-20 ENCOUNTER — Other Ambulatory Visit: Payer: Self-pay

## 2024-03-21 ENCOUNTER — Other Ambulatory Visit (HOSPITAL_COMMUNITY): Payer: Self-pay

## 2024-03-21 ENCOUNTER — Telehealth: Payer: Self-pay

## 2024-03-21 ENCOUNTER — Other Ambulatory Visit: Payer: Self-pay | Admitting: Hematology and Oncology

## 2024-03-21 DIAGNOSIS — C9000 Multiple myeloma not having achieved remission: Secondary | ICD-10-CM

## 2024-03-21 MED ORDER — OXYCODONE HCL 5 MG PO TABS
5.0000 mg | ORAL_TABLET | ORAL | 0 refills | Status: DC | PRN
  Filled 2024-03-21: qty 90, 15d supply, fill #0

## 2024-03-21 NOTE — Telephone Encounter (Signed)
He called and left a message requesting Oxycodone refill.

## 2024-03-21 NOTE — Telephone Encounter (Signed)
Called and told Rx sent. He verbalized understanding.

## 2024-03-28 ENCOUNTER — Other Ambulatory Visit (HOSPITAL_COMMUNITY): Payer: Self-pay

## 2024-04-05 DIAGNOSIS — M1711 Unilateral primary osteoarthritis, right knee: Secondary | ICD-10-CM | POA: Diagnosis not present

## 2024-04-12 ENCOUNTER — Other Ambulatory Visit: Payer: Self-pay | Admitting: Hematology and Oncology

## 2024-04-12 DIAGNOSIS — C9000 Multiple myeloma not having achieved remission: Secondary | ICD-10-CM

## 2024-04-12 NOTE — Telephone Encounter (Signed)
Pls refill this electronically

## 2024-04-13 DIAGNOSIS — Z85820 Personal history of malignant melanoma of skin: Secondary | ICD-10-CM | POA: Diagnosis not present

## 2024-04-13 DIAGNOSIS — L570 Actinic keratosis: Secondary | ICD-10-CM | POA: Diagnosis not present

## 2024-04-13 DIAGNOSIS — L905 Scar conditions and fibrosis of skin: Secondary | ICD-10-CM | POA: Diagnosis not present

## 2024-04-13 DIAGNOSIS — L821 Other seborrheic keratosis: Secondary | ICD-10-CM | POA: Diagnosis not present

## 2024-04-13 DIAGNOSIS — C44319 Basal cell carcinoma of skin of other parts of face: Secondary | ICD-10-CM | POA: Diagnosis not present

## 2024-04-13 DIAGNOSIS — D485 Neoplasm of uncertain behavior of skin: Secondary | ICD-10-CM | POA: Diagnosis not present

## 2024-04-17 ENCOUNTER — Other Ambulatory Visit (HOSPITAL_COMMUNITY): Payer: Self-pay

## 2024-04-17 ENCOUNTER — Other Ambulatory Visit: Payer: Self-pay | Admitting: Hematology and Oncology

## 2024-04-17 DIAGNOSIS — C9000 Multiple myeloma not having achieved remission: Secondary | ICD-10-CM

## 2024-04-17 MED ORDER — OXYCODONE HCL 5 MG PO TABS
5.0000 mg | ORAL_TABLET | ORAL | 0 refills | Status: DC | PRN
  Filled 2024-04-17: qty 90, 15d supply, fill #0

## 2024-04-18 ENCOUNTER — Encounter: Payer: Self-pay | Admitting: Podiatry

## 2024-04-18 ENCOUNTER — Ambulatory Visit (INDEPENDENT_AMBULATORY_CARE_PROVIDER_SITE_OTHER): Admitting: Podiatry

## 2024-04-18 VITALS — BP 132/90 | HR 62 | Temp 97.6°F | Resp 16 | Ht 69.0 in | Wt 233.0 lb

## 2024-04-18 DIAGNOSIS — L84 Corns and callosities: Secondary | ICD-10-CM | POA: Diagnosis not present

## 2024-04-18 DIAGNOSIS — Z7901 Long term (current) use of anticoagulants: Secondary | ICD-10-CM | POA: Diagnosis not present

## 2024-04-18 NOTE — Progress Notes (Signed)
 Subjective:   Patient ID: Jesse JINNY Percy Mickey., adult   DOB: 73 y.o.   MRN: [id number]   HPI Chief Complaint  Patient presents with   Callouses    Left foot callous trim    73 year old male presents the office today with above concerns.  He states the calluses started to cause pain again to get bigger.  No open lesions or any drainage.  He is on xarelto    Objective:  Physical Exam  General: AAO x3, NAD  Dermatological: Hyperkeratotic lesion noted left foot submetatarsal 1 without any underlying ulceration drainage or signs of infection.  There are no open lesions.  Vascular: Dorsalis Pedis artery and Posterior Tibial artery pedal pulses are 2/4 bilateral with immedate capillary fill time. There is no pain with calf compression, swelling, warmth, erythema.   Neruologic: Grossly intact via light touch bilateral.   Musculoskeletal: Prominence of metatarsal head plantarly with atrophy of the fat pad.  Tenderness directly on the hyperkeratotic lesion but no other areas of discomfort.  Gait: Unassisted, Nonantalgic.       Assessment:   Skin lesion right foot; chronic anticoagulation    Plan:  -Treatment options discussed including all alternatives, risks, and complications -Etiology of symptoms were discussed -Sharply debrided lesion x1 with any complications or bleeding.  Discussed moisturizer, offloading.  Dispensed offloading pads.  Jesse Mcclure DPM

## 2024-04-19 ENCOUNTER — Other Ambulatory Visit: Payer: Self-pay | Admitting: Physician Assistant

## 2024-04-19 NOTE — Telephone Encounter (Signed)
 Pt is requesting a refill on medication jardiance  10 mg tablets. This medication was prescribed in the hospital and has been refilled by pt's cancer doctor and per cancer doctor was told to refill going forward with PCP. Would Jesse Ferrier, PA like to refill this medication? Please address

## 2024-04-19 NOTE — Telephone Encounter (Signed)
*  STAT* If patient is at the pharmacy, call can be transferred to refill team.   1. Which medications need to be refilled? (please list name of each medication and dose if known) empagliflozin  (JARDIANCE ) 10 MG TABS tablet    2. Would you like to learn more about the convenience, safety, & potential cost savings by using the [facility]?   3. Are you open to using the [facility] (Type [facility].  ).   4. Which pharmacy/location (including street and city if local pharmacy) is medication to be sent to? [facility]    5. Do they need a 30 day or 90 day supply? 30 day

## 2024-04-21 ENCOUNTER — Other Ambulatory Visit (HOSPITAL_COMMUNITY): Payer: Self-pay

## 2024-04-21 MED ORDER — EMPAGLIFLOZIN 10 MG PO TABS
10.0000 mg | ORAL_TABLET | Freq: Every day | ORAL | 0 refills | Status: DC
  Filled 2024-04-21: qty 30, 30d supply, fill #0

## 2024-04-21 NOTE — Telephone Encounter (Signed)
 Pt's medication was sent to pt's pharmacy as requested. Confirmation received.

## 2024-04-25 DIAGNOSIS — C4491 Basal cell carcinoma of skin, unspecified: Secondary | ICD-10-CM | POA: Insufficient documentation

## 2024-04-25 NOTE — Progress Notes (Unsigned)
 Cardiology Office Note:  .   Date:  04/26/2024  ID:  Jesse Mcclure., DOB 04/02/1951, MRN [id number] PCP: [name], MD  [facility] Providers Cardiologist:  Alvan Ronal BRAVO, MD (Inactive) Cardiology APP:  Lelon Glendia DASEN, PA-C {  History of Present Illness: .   Jesse Mcclure. is a 73 y.o. adult  with PMHx of HFpEF (05/17/23:  EF 60-65), HTN, HLD, First degree AV block, Aortic atherosclerosis, Multiple myeloma (metastatic), Hemochromatosis, Hx of pulmonary embolism (09/2021), Hx of polio who reports to [facility] office for follow up and preop clearance.    Last seen in heartcare 09/08/2023 with Glendia Lelon, PA-C for follow up on CHF. Reported SOB with flight of steps once at the top. Noted improvement in SOB. Denied any other cardiac symptoms. Continued on current medication regimen.   Today, reports SOB with long distance and walking up steps but not with minimal exertion. Notes SOB has improved since last OV.  Denies CP, palpitation, edema, syncope, dizziness. Reports compliance with medications. Reports heart healthy low sodium. He is able to walk around stores including [employer] and [employer] with no CP or exertional concerns. He usually goes to gym but has not been able to due to knee pain. He has a flight of stairs in his home but he do not use them since he does not need anything from up there. Denies tobacco use/Binging ETOH/drug use. Denies any hospitalizations or visits to the emergency department. I cannot locate any preop request in EPIC but according to patient he has a pending TBD right knee replacement with Dr. Josefina.   ROS: 10 point review of system has been reviewed and considered negative except ones been listed in the HPI.   Studies Reviewed: SABRA   Exercise Myoview  2011 Normal study with good exercise capacity and normal LVEF  No signs of ischemia  Hypterntive BP response   ECHO 05/2023 IMPRESSIONS   1. Left ventricular ejection fraction, by estimation, is 60  to 65%. The  left ventricle has normal function. The left ventricle has no regional  wall motion abnormalities. There is mild concentric left ventricular  hypertrophy. Left ventricular diastolic  parameters are indeterminate.   2. Right ventricular systolic function is normal. The right ventricular  size is normal. Tricuspid regurgitation signal is inadequate for assessing  PA pressure.   3. Left atrial size was severely dilated.   4. The mitral valve is normal in structure. No evidence of mitral valve  regurgitation. No evidence of mitral stenosis.   5. The aortic valve is normal in structure. Aortic valve regurgitation is  not visualized. No aortic stenosis is present.   6. The inferior vena cava is normal in size with greater than 50%  respiratory variability, suggesting right atrial pressure of 3 mmHg.   EKG Interpretation Date/Time:  Wednesday April 26 2024 09:06:24 EDT Ventricular Rate:  65 PR Interval:  288 QRS Duration:  84 QT Interval:  394 QTC Calculation: 409 R Axis:   4  Text Interpretation: Sinus rhythm with 1st degree A-V block Nonspecific T wave abnormality Confirmed by Sheron Hallmark ([id number]) on 04/26/2024 9:09:56 AM   Physical Exam:   VS:  BP 134/84 (BP Location: Right Arm, Patient Position: Sitting, Cuff Size: Normal)   Pulse (!) 56   Ht 5' 9 (1.753 m)   Wt 231 lb 6.4 oz (105 kg)   SpO2 98%   BMI 34.17 kg/m    Wt Readings from Last 3 Encounters:  04/26/24 231 lb 6.4 oz (105 kg)  04/18/24 233 lb (105.7 kg)  03/02/24 233 lb (105.7 kg)    GEN: Well nourished, well developed in no acute distress while sitting in chair.  NECK: No JVD; No carotid bruits CARDIAC: RRR, no murmurs, rubs, gallops RESPIRATORY:  Clear to auscultation without rales, wheezing or rhonchi  ABDOMEN: Soft, non-tender, non-distended EXTREMITIES:  No edema; No deformity   ASSESSMENT AND PLAN: .   Preoperative Cardiovascular Risk Assessment I cannot locate any preop request in EPIC but  according to patient he has the following surgery pending:  Surgery: Right Knee replacement Date: TBD Surgeon: Fonda Olmsted, MD  [facility]  Fax: [phone] Revised Cardiac Risk Index (RCRI) with 1 point and perioperative risk of major cardiac events is 0.9%.  [facility] Activity Status Index (DASI) with > 4 mets .  From cardiac standpoint, patient is at acceptable risk for the planned procedure without further cardiovascular testing.  Defer Xarelto  hold time for surgery to PCP.   Chronic heart failure with preserved ejection fraction (HCC) ECHO 05/17/23: EF 60-65, no RWMA, mild LVH, NL RVSF, severe LAE, RAP 3  Reports SOB with long distance and walking up steps but not with minimal exertion. Notes SOB has improved since last OV.  Euvolemic on exam.  03/02/2024 Cr 1, K 3.9 Continue on Losartan  100 mg, Jardiance  10 mg and Lasix  40 mg daily   Essential hypertension BP in OV today, well controlled: 140/84; repeat BP: 138/84 He does not check BP at home. Discussed proper BP measurement. Patient is willing to check BP at home.  Continue Losartan  100 mg daily.  BP log X 2 weeks. If BP remains elevated then can consider hydrochlorothiazide  12.5 mg or amlodipine 2.5 mg.  Encourage physical activity for 150 minutes per week and heart healthy low sodium diet. Discussed limiting sodium intake to < 2 grams daily.   Pure hypercholesterolemia, LDL goal < 100 05/2023 LDL 69 02/2024 LFT WNL  Continue on Lipitor 20 mg daily   History of pulmonary embolus (PE) CTA 09/2021: Acute nonocclusive right upper and lower lobar and segmentalpulmonary emboli. Positive for acute PE with CT evidence of rightheart strain (RV/LV Ratio = 0.9.) consistent with at least submassive (intermediate risk) PE.  Continue Xarelto  10 mg daily. Managed by PCP.   Snoring Previously noted, reported snoring and partner reports possible apnea. Declined CPAP therapy.  Discussed possibility of sleep study but  patient continues to decline.  Discussed correlation of undiagnosed sleep apnea and HTN. Patient understands and is aware.   1st degree AV block Reviewed EKG 06/2023: NSR with 1st degree AV block, PR 304 ms  EKG today: NSR with 1st degree AV block,  PR 288 ms Denies any syncope.  Continue to monitor.   Multiple myeloma without remission [facility]) Per last oncology note 03/02/2024: Well-maintained on current treatment with pomalidomide  and Ninlaro . He is taper off dexamethasone .  Continue to follow with oncology as scheduled.     Dispo: Follow up in 6 months  Signed, Lorette CINDERELLA Kapur, PA-C

## 2024-04-26 ENCOUNTER — Encounter: Payer: Self-pay | Admitting: Physician Assistant

## 2024-04-26 ENCOUNTER — Ambulatory Visit: Attending: Physician Assistant | Admitting: Physician Assistant

## 2024-04-26 VITALS — BP 134/84 | HR 56 | Ht 69.0 in | Wt 231.4 lb

## 2024-04-26 DIAGNOSIS — Z86711 Personal history of pulmonary embolism: Secondary | ICD-10-CM | POA: Insufficient documentation

## 2024-04-26 DIAGNOSIS — C9000 Multiple myeloma not having achieved remission: Secondary | ICD-10-CM | POA: Insufficient documentation

## 2024-04-26 DIAGNOSIS — E7800 Pure hypercholesterolemia, unspecified: Secondary | ICD-10-CM | POA: Diagnosis not present

## 2024-04-26 DIAGNOSIS — Z01810 Encounter for preprocedural cardiovascular examination: Secondary | ICD-10-CM | POA: Diagnosis not present

## 2024-04-26 DIAGNOSIS — I440 Atrioventricular block, first degree: Secondary | ICD-10-CM | POA: Insufficient documentation

## 2024-04-26 DIAGNOSIS — R0683 Snoring: Secondary | ICD-10-CM | POA: Insufficient documentation

## 2024-04-26 DIAGNOSIS — I10 Essential (primary) hypertension: Secondary | ICD-10-CM | POA: Diagnosis not present

## 2024-04-26 DIAGNOSIS — I5032 Chronic diastolic (congestive) heart failure: Secondary | ICD-10-CM | POA: Insufficient documentation

## 2024-04-26 NOTE — Patient Instructions (Addendum)
 Medication Instructions:  No medication changes were made during today's visit.   Remember to monitor your blood pressure.   Bring the blood pressure log with you to your next office visit.  *If you need a refill on your cardiac medications before your next appointment, please call your pharmacy*   Lab Work: No labs were ordered during today's visit.  If you have labs (blood work) drawn today and your tests are completely normal, you will receive your results only by: MyChart Message (if you have MyChart) OR A paper copy in the mail If you have any lab test that is abnormal or we need to change your treatment, we will call you to review the results.   Testing/Procedures: No procedures were ordered during today's visit.    Follow-Up: At [facility], you and your health needs are our priority.  As part of our continuing mission to provide you with exceptional heart care, we have created designated Provider Care Teams.  These Care Teams include your primary Cardiologist (physician) and Advanced Practice Providers (APPs -  Physician Assistants and Nurse Practitioners) who all work together to provide you with the care you need, when you need it.  We recommend signing up for the patient portal called MyChart.  Sign up information is provided on this After Visit Summary.  MyChart is used to connect with patients for Virtual Visits (Telemedicine).  Patients are able to view lab/test results, encounter notes, upcoming appointments, etc.  Non-urgent messages can be sent to your provider as well.   To learn more about what you can do with MyChart, go to [email or web address].    Your next appointment:   6 month(s)  Provider:   Jon Hails, PA-C          Other Instructions Thank you for choosing [facility]!      BLOOD PRESSURE READINGS TAKE YOUR BLOOD PRESSURE AT LEAST 1 HOUR AFTER TAKING YOUR MEDICATION BRING LOG WITH YOU TO YOUR FOLLOW UP APPOINTMENT FOR  REVIEW   DATE TIME BP HR COMMENT DATE  TIME  BP HR COMMENT  DATE TIME BP HR COMMENT DATE  TIME  BP HR COMMENT                                                                                                                                                                                                                                                                  DATE TIME BP HR COMMENT DATE  TIME  BP HR COMMENT

## 2024-05-02 ENCOUNTER — Inpatient Hospital Stay (HOSPITAL_BASED_OUTPATIENT_CLINIC_OR_DEPARTMENT_OTHER): Admitting: Hematology and Oncology

## 2024-05-02 ENCOUNTER — Inpatient Hospital Stay: Attending: Hematology and Oncology

## 2024-05-02 ENCOUNTER — Encounter: Payer: Self-pay | Admitting: Hematology and Oncology

## 2024-05-02 VITALS — BP 145/87 | HR 61 | Temp 99.1°F | Resp 18 | Ht 69.0 in | Wt 228.8 lb

## 2024-05-02 DIAGNOSIS — M1711 Unilateral primary osteoarthritis, right knee: Secondary | ICD-10-CM | POA: Insufficient documentation

## 2024-05-02 DIAGNOSIS — Z7901 Long term (current) use of anticoagulants: Secondary | ICD-10-CM | POA: Insufficient documentation

## 2024-05-02 DIAGNOSIS — C9000 Multiple myeloma not having achieved remission: Secondary | ICD-10-CM | POA: Insufficient documentation

## 2024-05-02 DIAGNOSIS — Z86718 Personal history of other venous thrombosis and embolism: Secondary | ICD-10-CM | POA: Diagnosis not present

## 2024-05-02 LAB — COMPREHENSIVE METABOLIC PANEL WITH GFR
ALT: 27 U/L (ref 0–44)
AST: 22 U/L (ref 15–41)
Albumin: 3.8 g/dL (ref 3.5–5.0)
Alkaline Phosphatase: 68 U/L (ref 38–126)
Anion gap: 5 (ref 5–15)
BUN: 19 mg/dL (ref 8–23)
CO2: 32 mmol/L (ref 22–32)
Calcium: 9.3 mg/dL (ref 8.9–10.3)
Chloride: 104 mmol/L (ref 98–111)
Creatinine, Ser: 0.92 mg/dL (ref 0.61–1.24)
GFR, Estimated: >60 mL/min (ref >60)
Glucose, Bld: 72 mg/dL (ref 70–99)
Potassium: 4.5 mmol/L (ref 3.5–5.1)
Sodium: 141 mmol/L (ref 135–145)
Total Bilirubin: 1 mg/dL (ref 0.0–1.2)
Total Protein: 7.1 g/dL (ref 6.5–8.1)

## 2024-05-02 LAB — CBC WITH DIFFERENTIAL/PLATELET
Abs Immature Granulocytes: 0.01 K/uL (ref 0.00–0.07)
Basophils Absolute: 0 K/uL (ref 0.0–0.1)
Basophils Relative: 1 %
Eosinophils Absolute: 0.1 K/uL (ref 0.0–0.5)
Eosinophils Relative: 2 %
HCT: 46 % (ref 39.0–52.0)
Hemoglobin: 15.3 g/dL (ref 13.0–17.0)
Immature Granulocytes: 0 %
Lymphocytes Relative: 43 %
Lymphs Abs: 1.9 K/uL (ref 0.7–4.0)
MCH: 34.1 pg — ABNORMAL HIGH (ref 26.0–34.0)
MCHC: 33.3 g/dL (ref 30.0–36.0)
MCV: 102.4 fL — ABNORMAL HIGH (ref 80.0–100.0)
Monocytes Absolute: 0.8 K/uL (ref 0.1–1.0)
Monocytes Relative: 19 %
Neutro Abs: 1.6 K/uL — ABNORMAL LOW (ref 1.7–7.7)
Neutrophils Relative %: 35 %
Platelets: 183 K/uL (ref 150–400)
RBC: 4.49 MIL/uL (ref 4.22–5.81)
RDW: 14.8 % (ref 11.5–15.5)
WBC: 4.5 K/uL (ref 4.0–10.5)
nRBC: 0.4 % — ABNORMAL HIGH (ref 0.0–0.2)

## 2024-05-02 NOTE — Assessment & Plan Note (Addendum)
 His right knee is bothering him and the patient is awaiting for clearance to proceed with right knee surgery From the oncology/hematology perspective, the patient is given verbal and written instruction to hold Xarelto  for 48 hours prior to surgery and to hold his chemotherapy for 7 days prior to surgery He can resume Xarelto  48 hours after surgery and resume chemotherapy 7 days after I completed his surgical clearance form and faxed it back to his orthopedic surgeon

## 2024-05-02 NOTE — Assessment & Plan Note (Addendum)
 He has recurrent multiple myeloma, IgG kappa subtype, standard risk, well-maintained on current treatment with pomalidomide  and Ninlaro .  He is taper off dexamethasone  Repeat myeloma panel is pending, from previous visit, stable He remains on Xarelto  for history of recurrent clots as well as DVT prophylaxis He will continue acyclovir  He is on calcium  and vitamin D  He received Zometa  in May, next dose will be in November I will continue treatment without changes

## 2024-05-02 NOTE — Progress Notes (Signed)
 [facility] OFFICE PROGRESS NOTE  Patient Care Team: [name], MD as PCP - General (Family Medicine) Alvan Ronal BRAVO, MD (Inactive) as PCP - Cardiology (Cardiology) Lonn Hicks, MD as Consulting Physician (Hematology and Oncology) Lelon Glendia ONEIDA DEVONNA as Physician Assistant (Cardiology)  Assessment & Plan Multiple myeloma without remission [facility]) He has recurrent multiple myeloma, IgG kappa subtype, standard risk, well-maintained on current treatment with pomalidomide  and Ninlaro .  He is taper off dexamethasone  Repeat myeloma panel is pending, from previous visit, stable He remains on Xarelto  for history of recurrent clots as well as DVT prophylaxis He will continue acyclovir  He is on calcium  and vitamin D  He received Zometa  in May, next dose will be in November I will continue treatment without changes Primary osteoarthritis of right knee His right knee is bothering him and the patient is awaiting for clearance to proceed with right knee surgery From the oncology/hematology perspective, the patient is given verbal and written instruction to hold Xarelto  for 48 hours prior to surgery and to hold his chemotherapy for 7 days prior to surgery He can resume Xarelto  48 hours after surgery and resume chemotherapy 7 days after I completed his surgical clearance form and faxed it back to his orthopedic surgeon  No orders of the defined types were placed in this encounter.    Hicks Lonn, MD  INTERVAL HISTORY: he returns for treatment follow-up Complications related to previous cycle of chemotherapy included cancer associated pain, He tolerated chemotherapy well He is to take intermittent doses of oxycodone  for pain He is interested to pursue right knee surgery in the near future, awaiting clearance  PHYSICAL EXAMINATION: ECOG PERFORMANCE STATUS: 0 - Asymptomatic  No results found for: [id number]    Latest Ref Rng & Units 05/02/2024   10:38 AM 03/02/2024   12:07 PM  12/09/2023    1:00 PM  CBC  WBC 4.0 - 10.5 K/uL 4.5  4.0  4.8   Hemoglobin 13.0 - 17.0 g/dL 84.6  84.5  84.0   Hematocrit 39.0 - 52.0 % 46.0  44.9  47.9   Platelets 150 - 400 K/uL 183  225  216       Chemistry      Component Value Date/Time   NA 140 03/02/2024 1207   NA 139 06/01/2023 1115   NA 139 04/05/2014 0849   K 3.9 03/02/2024 1207   K 4.4 04/05/2014 0849   CL 108 03/02/2024 1207   CO2 26 03/02/2024 1207   CO2 23 04/05/2014 0849   BUN 20 03/02/2024 1207   BUN 26 06/01/2023 1115   BUN 16.8 04/05/2014 0849   CREATININE 1.00 03/02/2024 1207   CREATININE 0.80 10/29/2022 1046   CREATININE 0.91 07/29/2017 1004   CREATININE 0.9 04/05/2014 0849   GLU 10 04/10/2014 0000      Component Value Date/Time   CALCIUM  9.1 03/02/2024 1207   CALCIUM  8.7 04/05/2014 0849   ALKPHOS 60 03/02/2024 1207   ALKPHOS 75 04/05/2014 0849   AST 19 03/02/2024 1207   AST 24 10/29/2022 1046   AST 26 04/05/2014 0849   ALT 22 03/02/2024 1207   ALT 31 10/29/2022 1046   ALT 30 04/05/2014 0849   BILITOT 1.1 03/02/2024 1207   BILITOT 0.9 06/01/2023 1115   BILITOT 0.9 10/29/2022 1046   BILITOT 0.71 04/05/2014 0849       Vitals:   05/02/24 1053  BP: (!) 145/87  Pulse: 61  Resp: 18  Temp: 99.1 F (37.3 C)  SpO2: 96%   Filed Weights   05/02/24 1053  Weight: 228 lb 12.8 oz (103.8 kg)   Other relevant data reviewed during this visit included CBC and CMP, previous myeloma panel

## 2024-05-03 LAB — KAPPA/LAMBDA LIGHT CHAINS
Kappa free light chain: 58.2 mg/L — ABNORMAL HIGH (ref 3.3–19.4)
Kappa, lambda light chain ratio: 2.9 — ABNORMAL HIGH (ref 0.26–1.65)
Lambda free light chains: 20.1 mg/L (ref 5.7–26.3)

## 2024-05-04 ENCOUNTER — Telehealth: Payer: Self-pay

## 2024-05-04 LAB — MULTIPLE MYELOMA PANEL, SERUM
Albumin SerPl Elph-Mcnc: 3.4 g/dL (ref 2.9–4.4)
Albumin/Glob SerPl: 1.1 (ref 0.7–1.7)
Alpha 1: 0.2 g/dL (ref 0.0–0.4)
Alpha2 Glob SerPl Elph-Mcnc: 0.8 g/dL (ref 0.4–1.0)
B-Globulin SerPl Elph-Mcnc: 0.8 g/dL (ref 0.7–1.3)
Gamma Glob SerPl Elph-Mcnc: 1.3 g/dL (ref 0.4–1.8)
Globulin, Total: 3.1 g/dL (ref 2.2–3.9)
IgA: 161 mg/dL (ref 61–437)
IgG (Immunoglobin G), Serum: 1381 mg/dL (ref 603–1613)
IgM (Immunoglobulin M), Srm: 47 mg/dL (ref 15–143)
M Protein SerPl Elph-Mcnc: 0.6 g/dL — ABNORMAL HIGH
Total Protein ELP: 6.5 g/dL (ref 6.0–8.5)

## 2024-05-04 NOTE — Telephone Encounter (Signed)
 Copied from CRM [id number]. Topic: General - Other >> May 04, 2024 11:29 AM Charlet HERO wrote: Reason for CRM: Patient is calling to make sure that the form for his knee surgery is filled out and faxed back to provider.   Called Pt and let him know form will be filled out at time of visit on 8/5

## 2024-05-05 ENCOUNTER — Telehealth: Payer: Self-pay

## 2024-05-05 ENCOUNTER — Other Ambulatory Visit (HOSPITAL_COMMUNITY): Payer: Self-pay

## 2024-05-05 ENCOUNTER — Other Ambulatory Visit: Payer: Self-pay | Admitting: Hematology and Oncology

## 2024-05-05 DIAGNOSIS — C9000 Multiple myeloma not having achieved remission: Secondary | ICD-10-CM

## 2024-05-05 MED ORDER — OXYCODONE HCL 5 MG PO TABS
5.0000 mg | ORAL_TABLET | ORAL | 0 refills | Status: DC | PRN
  Filled 2024-05-05: qty 90, 15d supply, fill #0

## 2024-05-05 NOTE — Telephone Encounter (Signed)
 Patient notified that Dr. Lonn has sent in refill for his oxycodone  to [facility]. Patient verbalized an understanding of the information and voice appreciation for the call.

## 2024-05-09 ENCOUNTER — Other Ambulatory Visit (HOSPITAL_COMMUNITY): Payer: Self-pay

## 2024-05-09 ENCOUNTER — Encounter: Payer: Self-pay | Admitting: Family Medicine

## 2024-05-09 ENCOUNTER — Ambulatory Visit: Admitting: Family Medicine

## 2024-05-09 ENCOUNTER — Other Ambulatory Visit: Payer: Self-pay

## 2024-05-09 VITALS — BP 132/82 | HR 58 | Ht 69.0 in | Wt 232.6 lb

## 2024-05-09 DIAGNOSIS — I10 Essential (primary) hypertension: Secondary | ICD-10-CM | POA: Diagnosis not present

## 2024-05-09 DIAGNOSIS — Z86711 Personal history of pulmonary embolism: Secondary | ICD-10-CM

## 2024-05-09 DIAGNOSIS — I5030 Unspecified diastolic (congestive) heart failure: Secondary | ICD-10-CM

## 2024-05-09 DIAGNOSIS — M1000 Idiopathic gout, unspecified site: Secondary | ICD-10-CM | POA: Diagnosis not present

## 2024-05-09 DIAGNOSIS — Z01818 Encounter for other preprocedural examination: Secondary | ICD-10-CM

## 2024-05-09 DIAGNOSIS — D0322 Melanoma in situ of left ear and external auricular canal: Secondary | ICD-10-CM

## 2024-05-09 DIAGNOSIS — E785 Hyperlipidemia, unspecified: Secondary | ICD-10-CM | POA: Diagnosis not present

## 2024-05-09 DIAGNOSIS — Z96652 Presence of left artificial knee joint: Secondary | ICD-10-CM

## 2024-05-09 MED ORDER — LOSARTAN POTASSIUM 100 MG PO TABS
100.0000 mg | ORAL_TABLET | Freq: Every day | ORAL | 3 refills | Status: AC
  Filled 2024-05-09: qty 90, 90d supply, fill #0
  Filled 2024-08-11: qty 90, 90d supply, fill #1

## 2024-05-09 MED ORDER — EMPAGLIFLOZIN 10 MG PO TABS
10.0000 mg | ORAL_TABLET | Freq: Every day | ORAL | 3 refills | Status: AC
Start: 2024-05-09 — End: ?
  Filled 2024-05-09 – 2024-05-23 (×2): qty 90, 90d supply, fill #0
  Filled 2024-08-30: qty 90, 90d supply, fill #1

## 2024-05-09 MED ORDER — ATORVASTATIN CALCIUM 20 MG PO TABS
20.0000 mg | ORAL_TABLET | Freq: Every day | ORAL | 3 refills | Status: AC
Start: 2024-05-09 — End: ?
  Filled 2024-05-09 – 2024-06-12 (×2): qty 90, 90d supply, fill #0
  Filled 2024-09-18: qty 90, 90d supply, fill #1

## 2024-05-09 NOTE — Progress Notes (Signed)
   Subjective:    Patient ID: Jesse Mcclure., adult    DOB: October 09, 1950, 73 y.o.   MRN: [id number]  HPI He is here for preoperative evaluation.  He has already seen cardiology and oncology and they have signed off on his procedure.  He is going to have a right knee TKR.  He is already had a left TKR.  He has no particular concerns or complaints.  He has had no chest pain, shortness of breath, weakness, pulmonary related symptoms.  He is taking Jardiance  for the cardiac benefits.  His blood pressure seems to be under good control.  He does need a refill on his Lipitor as well as blood work.  He follows up regularly with dermatology.  He does have a history of gout but has not had any flares in quite some time.  He continues on Xarelto  indefinitely.   Review of Systems     Objective:    Physical Exam Alert and in no distress. Tympanic membranes and canals are normal. Pharyngeal area is normal. Neck is supple without adenopathy or thyromegaly. Cardiac exam shows a regular sinus rhythm without murmurs or gallops. Lungs are clear to auscultation.        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  He is cleared for surgery. Also recommend getting Tdap and later get COVID and flu shot.

## 2024-05-10 ENCOUNTER — Other Ambulatory Visit: Payer: Self-pay | Admitting: Hematology and Oncology

## 2024-05-10 ENCOUNTER — Ambulatory Visit: Payer: Self-pay | Admitting: Family Medicine

## 2024-05-10 DIAGNOSIS — C9000 Multiple myeloma not having achieved remission: Secondary | ICD-10-CM

## 2024-05-10 LAB — LIPID PANEL
Chol/HDL Ratio: 2.7 ratio (ref 0.0–5.0)
Cholesterol, Total: 140 mg/dL (ref 100–199)
HDL: 51 mg/dL (ref >39)
LDL Chol Calc (NIH): 67 mg/dL (ref 0–99)
Triglycerides: 122 mg/dL (ref 0–149)
VLDL Cholesterol Cal: 22 mg/dL (ref 5–40)

## 2024-05-10 NOTE — Telephone Encounter (Signed)
Pls refill electronically °

## 2024-05-23 ENCOUNTER — Other Ambulatory Visit (HOSPITAL_COMMUNITY): Payer: Self-pay

## 2024-05-25 ENCOUNTER — Other Ambulatory Visit: Payer: Self-pay | Admitting: Hematology and Oncology

## 2024-05-25 ENCOUNTER — Other Ambulatory Visit (HOSPITAL_COMMUNITY): Payer: Self-pay

## 2024-05-25 DIAGNOSIS — C9000 Multiple myeloma not having achieved remission: Secondary | ICD-10-CM

## 2024-05-25 MED ORDER — OXYCODONE HCL 5 MG PO TABS
5.0000 mg | ORAL_TABLET | ORAL | 0 refills | Status: DC | PRN
  Filled 2024-05-25: qty 90, 15d supply, fill #0

## 2024-06-07 ENCOUNTER — Other Ambulatory Visit: Payer: Self-pay | Admitting: Hematology and Oncology

## 2024-06-07 DIAGNOSIS — C9000 Multiple myeloma not having achieved remission: Secondary | ICD-10-CM

## 2024-06-08 ENCOUNTER — Other Ambulatory Visit: Payer: Self-pay | Admitting: *Deleted

## 2024-06-08 DIAGNOSIS — C9000 Multiple myeloma not having achieved remission: Secondary | ICD-10-CM

## 2024-06-08 MED ORDER — POMALIDOMIDE 3 MG PO CAPS
3.0000 mg | ORAL_CAPSULE | Freq: Every day | ORAL | 0 refills | Status: AC
Start: 2024-06-08 — End: 2024-06-29

## 2024-06-08 NOTE — Telephone Encounter (Signed)
 Refilled by other means with authorization number attached.

## 2024-06-08 NOTE — Progress Notes (Signed)
 Sent message, via epic in basket, requesting orders in epic from Careers adviser.

## 2024-06-08 NOTE — Progress Notes (Signed)
 Obtained authorization number and refilled Pomalyst 

## 2024-06-09 ENCOUNTER — Other Ambulatory Visit: Payer: Self-pay

## 2024-06-12 ENCOUNTER — Other Ambulatory Visit (HOSPITAL_COMMUNITY): Payer: Self-pay

## 2024-06-12 DIAGNOSIS — M1711 Unilateral primary osteoarthritis, right knee: Secondary | ICD-10-CM | POA: Diagnosis not present

## 2024-06-14 NOTE — Progress Notes (Signed)
 COVID Vaccine received:  []  No [x]  Yes Date of any COVID positive Test in last 90 days:  PCP - Norleen Jobs, MD clearance in 05-09-2024 Epic note Cardiologist - Glendia Ferrier, PA-C, Lorette Kapur PA-C Cardiac clearance in 04-26-24 Epic note Hematology- Almarie Bedford, MD Clearance in 05-02-24 Epic note  Chest x-ray - 05-16-2023  2v  Epic EKG -  04-26-2024  Epic Stress Test - 2011 Epic ECHO - 05-17-2023  Epic Cardiac Cath -  CT Coronary Calcium  score:   Pacemaker / ICD device [x]  No []  Yes   Spinal Cord Stimulator:[x]  No []  Yes       History of Sleep Apnea? [x]  No []  Yes   CPAP used?- [x]  No []  Yes    Patient has: [x]  NO Hx DM   []  Pre-DM   []  DM1  []   DM2 Does the patient monitor blood sugar?   [x]  N/A   []  No []  Yes   Blood Thinner / Instructions:  Xarelto   Hold x 48 hours, ok'd by Dr. Bedford, last dose 06-24-24 Aspirin Instructions: none  ERAS Protocol Ordered: []  No  [x]  Yes PRE-SURGERY [x]  ENSURE  []  G2   Patient is to be NPO after: 1000  Dental hx: []  Dentures:  []  N/A      []  Bridge or Partial:                   [x]  Loose or Damaged teeth:   Comments: Patient was given the 5 CHG shower / bath instructions for TKA surgery along with 2 bottles of the CHG soap. Patient will start this on:  Friday 06-23-2024            Activity level: Able to walk up 2 flights of stairs without becoming significantly short of breath or having chest pain?  []  No   []    Yes  Patient can perform ADLs without assistance. []  No   []   Yes  Anesthesia review: Hemochromatosis, Mult. Myeloma- current chemo, Tremors, HTN, HFpEF, Hx PE, Hx Polio, Has bone loss in mandible  Patient denies any S&S of respiratory illness or Covid - no shortness of breath, fever, cough or chest pain at PAT appointment.  Patient verbalized understanding and agreement to the Pre-Surgical Instructions that were given to them at this PAT appointment. Patient was also educated of the need to review these PAT instructions again prior to  his surgery.I reviewed the appropriate phone numbers to call if they have any and questions or concerns.

## 2024-06-14 NOTE — Patient Instructions (Signed)
 SURGICAL WAITING ROOM VISITATION Patients having surgery or a procedure may have no more than 2 support people in the waiting area - these visitors may rotate in the visitor waiting room.   If the patient needs to stay at the hospital during part of their recovery, the visitor guidelines for inpatient rooms apply.  PRE-OP VISITATION  Pre-op nurse will coordinate an appropriate time for 1 support person to accompany the patient in pre-op.  This support person may not rotate.  This visitor will be contacted when the time is appropriate for the visitor to come back in the pre-op area.  Please refer to the [facility] website for the visitor guidelines for Inpatients (after your surgery is over and you are in a regular room).  You are not required to quarantine at this time prior to your surgery. However, you must do this: Hand Hygiene often Do NOT share personal items Notify your provider if you are in close contact with someone who has COVID or you develop fever 100.4 or greater, new onset of sneezing, cough, sore throat, shortness of breath or body aches.  If you test positive for Covid or have been in contact with anyone that has tested positive in the last 10 days please notify you surgeon.    Your procedure is scheduled on:  TUESDAY  June 27, 2024  Report to [facility] Main Entrance: [address] entrance where the [employer] is available.   Report to admitting at: 10:30  AM  Call this number if you have any questions or problems the morning of surgery [phone]  Do not eat food after Midnight the night prior to your surgery/procedure.  After Midnight you may have the following liquids until  10:00 AM DAY OF SURGERY  Clear Liquid Diet Water  Black Coffee (sugar ok, NO MILK/CREAM OR CREAMERS)  Tea (sugar ok, NO MILK/CREAM OR CREAMERS) regular and decaf                             Plain Jell-O  with no fruit (NO RED)                                           Fruit  ices (not with fruit pulp, NO RED)                                     Popsicles (NO RED)                                                                  Juice: NO CITRUS JUICES: only apple, WHITE grape, WHITE cranberry Sports drinks like Gatorade or Powerade (NO RED)                   The day of surgery:  Drink ONE (1) Pre-Surgery Clear Ensure  at   10:00 AM the morning of surgery. Drink in one sitting. Do not sip.  This drink was given to you during your hospital pre-op appointment visit. Nothing else to drink after completing  the Pre-Surgery Clear Ensure : No candy, chewing gum or throat lozenges.    FOLLOW ANY ADDITIONAL PRE OP INSTRUCTIONS YOU RECEIVED FROM YOUR SURGEON'S OFFICE!!!   Oral Hygiene is also important to reduce your risk of infection.        Remember - BRUSH YOUR TEETH THE MORNING OF SURGERY WITH YOUR REGULAR TOOTHPASTE  Do NOT smoke after Midnight the night before surgery.  JARDIANCE - Stop taking 48 hours before your surgery. The last dose will be taken on Saturday 06-24-2024  STOP TAKING all Vitamins, Herbs and supplements 1 week before your surgery.   Take ONLY these medicines the morning of surgery with A SIP OF WATER :  ixazomib citrate  (Ninlaro ), pomalidomide  (Pomalyst ), and Oxycodone  IR if needed for pain. You may use your Eye drops.   DO NOT TAKE Losartan  or Furosemide  the morning of your surgery.   If You have been diagnosed with Sleep Apnea - Bring CPAP mask and tubing day of surgery. We will provide you with a CPAP machine on the day of your surgery.                   You may not have any metal on your body including  jewelry, and body piercing  Do not wear lotions, powders, cologne, or deodorant  Men may shave face and neck.  Contacts, Hearing Aids, dentures or bridgework may not be worn into surgery. DENTURES WILL BE REMOVED PRIOR TO SURGERY PLEASE DO NOT APPLY Poly grip OR ADHESIVES!!!  You may bring a small overnight bag with you on the day of  surgery, only pack items that are not valuable. [facility] IS NOT RESPONSIBLE   FOR VALUABLES THAT ARE LOST OR STOLEN.   Do not bring your home medications to the hospital. The Pharmacy will dispense medications listed on your medication list to you during your admission in the Hospital.  Special Instructions: Bring a copy of your healthcare power of attorney and living will documents the day of surgery, if you wish to have them scanned into your [facility] Records- EPIC  Please read over the following fact sheets you were given: IF YOU HAVE QUESTIONS ABOUT YOUR PRE-OP INSTRUCTIONS, PLEASE CALL [phone].     Pre-operative 5 CHG Bath Instructions   You can play a key role in reducing the risk of infection after surgery. Your skin needs to be as free of germs as possible. You can reduce the number of germs on your skin by washing with CHG (chlorhexidine  gluconate) soap before surgery. CHG is an antiseptic soap that kills germs and continues to kill germs even after washing.   DO NOT use if you have an allergy to chlorhexidine /CHG or antibacterial soaps. If your skin becomes reddened or irritated, stop using the CHG and notify one of our RNs at [phone]  Please shower with the CHG soap starting 4 days before surgery using the following schedule: START SHOWERS ON  FRIDAY  June 23, 2024  Please keep in mind the following:  DO NOT shave, including legs and underarms, starting the day of your first shower.   You may shave your face at any point before/day of surgery.   Place clean sheets on your bed the day you start using CHG soap. Use a clean washcloth (not used since being washed) for each shower. DO NOT sleep with pets once you start using the CHG.   CHG Shower Instructions:  If you choose to wash  your hair and private area, wash first with your normal shampoo/soap.  After you use shampoo/soap, rinse your hair and body thoroughly to remove shampoo/soap residue.  Turn the water  OFF and apply about 3 tablespoons (45 ml) of CHG soap to a CLEAN washcloth.  Apply CHG soap ONLY FROM YOUR NECK DOWN TO YOUR TOES (washing for 3-5 minutes)  DO NOT use CHG soap on face, private areas, open wounds, or sores.  Pay special attention to the area where your surgery is being performed.  If you are having back surgery, having someone wash your back for you may be helpful.  Wait 2 minutes after CHG soap is applied, then you may rinse off the CHG soap.  Pat dry with a clean towel  Put on clean clothes/pajamas   If you choose to wear lotion, please use ONLY the CHG-compatible lotions on the back of this paper.     Additional instructions for the day of surgery: DO NOT APPLY any lotions, deodorants, cologne, or perfumes.   Put on clean/comfortable clothes.  Brush your teeth.  Ask your nurse before applying any prescription medications to the skin.      CHG Compatible Lotions   Aveeno Moisturizing lotion  Cetaphil Moisturizing Cream  Cetaphil Moisturizing Lotion  Clairol Herbal Essence Moisturizing Lotion, Dry Skin  Clairol Herbal Essence Moisturizing Lotion, Extra Dry Skin  Clairol Herbal Essence Moisturizing Lotion, Normal Skin  Curel Age Defying Therapeutic Moisturizing Lotion with Alpha Hydroxy  Curel Extreme Care Body Lotion  Curel Soothing Hands Moisturizing Hand Lotion  Curel Therapeutic Moisturizing Cream, Fragrance-Free  Curel Therapeutic Moisturizing Lotion, Fragrance-Free  Curel Therapeutic Moisturizing Lotion, Original Formula  Eucerin Daily Replenishing Lotion  Eucerin Dry Skin Therapy Plus Alpha Hydroxy Crme  Eucerin Dry Skin Therapy Plus Alpha Hydroxy Lotion  Eucerin Original Crme  Eucerin Original Lotion  Eucerin Plus Crme Eucerin Plus Lotion  Eucerin TriLipid  Replenishing Lotion  Keri Anti-Bacterial Hand Lotion  Keri Deep Conditioning Original Lotion Dry Skin Formula Softly Scented  Keri Deep Conditioning Original Lotion, Fragrance Free Sensitive Skin Formula  Keri Lotion Fast Absorbing Fragrance Free Sensitive Skin Formula  Keri Lotion Fast Absorbing Softly Scented Dry Skin Formula  Keri Original Lotion  Keri Skin Renewal Lotion Keri Silky Smooth Lotion  Keri Silky Smooth Sensitive Skin Lotion  Nivea Body Creamy Conditioning Oil  Nivea Body Extra Enriched Lotion  Nivea Body Original Lotion  Nivea Body Sheer Moisturizing Lotion Nivea Crme  Nivea Skin Firming Lotion  NutraDerm 30 Skin Lotion  NutraDerm Skin Lotion  NutraDerm Therapeutic Skin Cream  NutraDerm Therapeutic Skin Lotion  ProShield Protective Hand Cream  Provon moisturizing lotion   FAILURE TO FOLLOW THESE INSTRUCTIONS MAY RESULT IN THE CANCELLATION OF YOUR SURGERY  PATIENT SIGNATURE_________________________________  NURSE SIGNATURE__________________________________  ________________________________________________________________________        Jesse Mcclure    An incentive spirometer is a tool that can help keep your lungs clear and active. This tool measures how well you are filling your lungs with each breath.  Taking long deep breaths may help reverse or decrease the chance of developing breathing (pulmonary) problems (especially infection) following: A long period of time when you are unable to move or be active. BEFORE THE PROCEDURE  If the spirometer includes an indicator to show your best effort, your nurse or respiratory therapist will set it to a desired goal. If possible, sit up straight or lean slightly forward. Try not to slouch. Hold the incentive spirometer in an upright position. INSTRUCTIONS FOR USE  Sit on the edge of your bed if possible, or sit up as far as you can in bed or on a chair. Hold the incentive spirometer in an upright  position. Breathe out normally. Place the mouthpiece in your mouth and seal your lips tightly around it. Breathe in slowly and as deeply as possible, raising the piston or the ball toward the top of the column. Hold your breath for 3-5 seconds or for as long as possible. Allow the piston or ball to fall to the bottom of the column. Remove the mouthpiece from your mouth and breathe out normally. Rest for a few seconds and repeat Steps 1 through 7 at least 10 times every 1-2 hours when you are awake. Take your time and take a few normal breaths between deep breaths. The spirometer may include an indicator to show your best effort. Use the indicator as a goal to work toward during each repetition. After each set of 10 deep breaths, practice coughing to be sure your lungs are clear. If you have an incision (the cut made at the time of surgery), support your incision when coughing by placing a pillow or rolled up towels firmly against it. Once you are able to get out of bed, walk around indoors and cough well. You may stop using the incentive spirometer when instructed by your caregiver.  RISKS AND COMPLICATIONS Take your time so you do not get dizzy or light-headed. If you are in pain, you may need to take or ask for pain medication before doing incentive spirometry. It is harder to take a deep breath if you are having pain. AFTER USE Rest and breathe slowly and easily. It can be helpful to keep track of a log of your progress. Your caregiver can provide you with a simple table to help with this. If you are using the spirometer at home, follow these instructions: SEEK MEDICAL CARE IF:  You are having difficultly using the spirometer. You have trouble using the spirometer as often as instructed. Your pain medication is not giving enough relief while using the spirometer. You develop fever of 100.5 F (38.1 C) or higher.                                                                                                     SEEK IMMEDIATE MEDICAL CARE IF:  You cough up bloody sputum that had not been present before. You develop fever of 102 F (38.9 C) or greater. You develop worsening pain at or near the incision site. MAKE SURE YOU:  Understand these instructions. Will watch your  condition. Will get help right away if you are not doing well or get worse. Document Released: 02/01/2007 Document Revised: 12/14/2011 Document Reviewed: 04/04/2007 [facility] Patient Information 2014 [place], [place].        If you would like to see a video about joint replacement:   [email or web address]

## 2024-06-15 ENCOUNTER — Other Ambulatory Visit: Payer: Self-pay

## 2024-06-15 ENCOUNTER — Encounter (HOSPITAL_COMMUNITY)
Admission: RE | Admit: 2024-06-15 | Discharge: 2024-06-15 | Disposition: A | Discharge status: Home / Self Care | Source: Ambulatory Visit | Attending: Orthopedic Surgery | Admitting: Orthopedic Surgery

## 2024-06-15 ENCOUNTER — Encounter (HOSPITAL_COMMUNITY): Payer: Self-pay

## 2024-06-15 VITALS — BP 138/77 | HR 55 | Temp 98.2°F | Resp 20 | Ht 69.0 in | Wt 233.0 lb

## 2024-06-15 DIAGNOSIS — Z79899 Other long term (current) drug therapy: Secondary | ICD-10-CM | POA: Insufficient documentation

## 2024-06-15 DIAGNOSIS — Z9221 Personal history of antineoplastic chemotherapy: Secondary | ICD-10-CM | POA: Diagnosis not present

## 2024-06-15 DIAGNOSIS — C9000 Multiple myeloma not having achieved remission: Secondary | ICD-10-CM | POA: Diagnosis not present

## 2024-06-15 DIAGNOSIS — I110 Hypertensive heart disease with heart failure: Secondary | ICD-10-CM | POA: Diagnosis not present

## 2024-06-15 DIAGNOSIS — Z01812 Encounter for preprocedural laboratory examination: Secondary | ICD-10-CM | POA: Insufficient documentation

## 2024-06-15 DIAGNOSIS — Z86711 Personal history of pulmonary embolism: Secondary | ICD-10-CM | POA: Insufficient documentation

## 2024-06-15 DIAGNOSIS — M1711 Unilateral primary osteoarthritis, right knee: Secondary | ICD-10-CM | POA: Diagnosis not present

## 2024-06-15 DIAGNOSIS — I509 Heart failure, unspecified: Secondary | ICD-10-CM | POA: Insufficient documentation

## 2024-06-15 DIAGNOSIS — Z01818 Encounter for other preprocedural examination: Secondary | ICD-10-CM

## 2024-06-15 HISTORY — DX: Disease of jaws, unspecified: M27.9

## 2024-06-15 HISTORY — DX: Disease of blood and blood-forming organs, unspecified: D75.9

## 2024-06-15 HISTORY — DX: Myoneural disorder, unspecified: G70.9

## 2024-06-15 HISTORY — DX: Tremor, unspecified: R25.1

## 2024-06-15 HISTORY — DX: Heart failure, unspecified: I50.9

## 2024-06-15 HISTORY — DX: Unspecified osteoarthritis, unspecified site: M19.90

## 2024-06-15 LAB — COMPREHENSIVE METABOLIC PANEL WITH GFR
ALT: 42 U/L (ref 0–44)
AST: 27 U/L (ref 15–41)
Albumin: 3.7 g/dL (ref 3.5–5.0)
Alkaline Phosphatase: 67 U/L (ref 38–126)
Anion gap: 12 (ref 5–15)
BUN: 24 mg/dL — ABNORMAL HIGH (ref 8–23)
CO2: 24 mmol/L (ref 22–32)
Calcium: 8.7 mg/dL — ABNORMAL LOW (ref 8.9–10.3)
Chloride: 105 mmol/L (ref 98–111)
Creatinine, Ser: 0.85 mg/dL (ref 0.61–1.24)
GFR, Estimated: >60 mL/min (ref >60)
Glucose, Bld: 84 mg/dL (ref 70–99)
Potassium: 3.8 mmol/L (ref 3.5–5.1)
Sodium: 140 mmol/L (ref 135–145)
Total Bilirubin: 1.9 mg/dL — ABNORMAL HIGH (ref 0.0–1.2)
Total Protein: 6.6 g/dL (ref 6.5–8.1)

## 2024-06-15 LAB — CBC
HCT: 44.9 % (ref 39.0–52.0)
Hemoglobin: 14.5 g/dL (ref 13.0–17.0)
MCH: 33.9 pg (ref 26.0–34.0)
MCHC: 32.3 g/dL (ref 30.0–36.0)
MCV: 104.9 fL — ABNORMAL HIGH (ref 80.0–100.0)
Platelets: 163 K/uL (ref 150–400)
RBC: 4.28 MIL/uL (ref 4.22–5.81)
RDW: 15 % (ref 11.5–15.5)
WBC: 2.9 K/uL — ABNORMAL LOW (ref 4.0–10.5)
nRBC: 0 % (ref 0.0–0.2)

## 2024-06-15 LAB — SURGICAL PCR SCREEN
MRSA, PCR: NEGATIVE
Staphylococcus aureus: NEGATIVE

## 2024-06-19 ENCOUNTER — Telehealth: Payer: Self-pay

## 2024-06-19 ENCOUNTER — Other Ambulatory Visit (HOSPITAL_COMMUNITY): Payer: Self-pay

## 2024-06-19 ENCOUNTER — Other Ambulatory Visit: Payer: Self-pay | Admitting: Hematology and Oncology

## 2024-06-19 DIAGNOSIS — C9000 Multiple myeloma not having achieved remission: Secondary | ICD-10-CM

## 2024-06-19 MED ORDER — OXYCODONE HCL 5 MG PO TABS
5.0000 mg | ORAL_TABLET | ORAL | 0 refills | Status: DC | PRN
  Filled 2024-06-19: qty 90, 15d supply, fill #0

## 2024-06-19 NOTE — Telephone Encounter (Signed)
 S/w patient to update appointments at provider request.  Patient's appointments rescheduled to week after his upcoming procedure to 9/30. New appointment date and times confirmed with patient while on the call.  Patient also requested oxycodone  refill be sent to [facility]. Dr. Lonn made aware of the request.

## 2024-06-20 ENCOUNTER — Inpatient Hospital Stay: Admitting: Hematology and Oncology

## 2024-06-20 ENCOUNTER — Inpatient Hospital Stay

## 2024-06-20 NOTE — Anesthesia Preprocedure Evaluation (Addendum)
 Anesthesia Evaluation  Patient identified by MRN, date of birth, ID band Patient awake    Reviewed: Allergy & Precautions, NPO status , Patient's Chart, lab work & pertinent test results  Airway Mallampati: III  TM Distance: >3 FB Neck ROM: Full    Dental no notable dental hx.    Pulmonary PE   Pulmonary exam normal        Cardiovascular hypertension, Pt. on medications +CHF  Normal cardiovascular exam     Neuro/Psych    GI/Hepatic negative GI ROS, Neg liver ROS,,,  Endo/Other  negative endocrine ROS    Renal/GU negative Renal ROS     Musculoskeletal  (+) Arthritis ,    Abdominal   Peds  Hematology  (+) Blood dyscrasia (Xarelto ) PLT: 163   Anesthesia Other Findings Primary osteoarthritis of right knee  Reproductive/Obstetrics                              Anesthesia Physical Anesthesia Plan  ASA: 3  Anesthesia Plan: General and Regional   Post-op Pain Management: Regional block*   Induction: Intravenous  PONV Risk Score and Plan: 2 and Ondansetron , Dexamethasone , Midazolam  and Treatment may vary due to age or medical condition  Airway Management Planned: LMA  Additional Equipment:   Intra-op Plan:   Post-operative Plan: Extubation in OR  Informed Consent: I have reviewed the patients History and Physical, chart, labs and discussed the procedure including the risks, benefits and alternatives for the proposed anesthesia with the patient or authorized representative who has indicated his/her understanding and acceptance.     Dental advisory given  Plan Discussed with: CRNA  Anesthesia Plan Comments: (PAT note 06/15/24)         Anesthesia Quick Evaluation

## 2024-06-20 NOTE — Progress Notes (Signed)
 Anesthesia Chart Review   Case: [id number] Date/Time: 06/27/24 1245   Procedure: ARTHROPLASTY, KNEE, TOTAL (Right: Knee)   Anesthesia type: General   Diagnosis: Primary osteoarthritis of right knee [M17.11]   Pre-op diagnosis: osteoarthritis of right knee   Location[facility] ROOM 08 / [facility]   Surgeons: Jesse Chew, MD       DISCUSSION:73 y.o. never smoker with h/o HTN, PE 2022 following left TKA, multiple myeloma, CHF, BPH, right knee OA scheduled for above procedure 06/27/24 with Dr. Chew Jesse.   Pt follows with hematology for multiple myeloma, last seen 05/02/24. Per note well maintained on current treatment with pomalidomide  and ninlaro . From the oncology/hematology perspective, the patient is given verbal and written instruction to hold Xarelto  for 48 hours prior to surgery and to hold his chemotherapy for 7 days prior to surgery He can resume Xarelto  48 hours after surgery and resume chemotherapy 7 days after  Pt last seen by cardiology 04/26/24. Per OV note, Revised Cardiac Risk Index (RCRI) with 1 point and perioperative risk of major cardiac events is 0.9%.  Duke Activity Status Index (DASI) with > 4 mets .  From cardiac standpoint, patient is at acceptable risk for the planned procedure without further cardiovascular testing.  Defer Xarelto  hold time for surgery to PCP.  VS: BP 138/77 Comment: left arm sitting  Pulse (!) 55   Temp 36.8 C   Resp 20   Ht 5' 9 (1.753 m)   Wt 105.7 kg   SpO2 97%   BMI 34.41 kg/m   PROVIDERS: Jesse Norleen BROCKS, MD is PCP   Cardiologist:  Jesse Ronal BRAVO, MD  LABS: Labs reviewed: Acceptable for surgery. (all labs ordered are listed, but only abnormal results are displayed)  Labs Reviewed  COMPREHENSIVE METABOLIC PANEL WITH GFR - Abnormal; Notable for the following components:      Result Value   BUN 24 (*)    Calcium  8.7 (*)    Total Bilirubin 1.9 (*)    All other components within normal limits  CBC - Abnormal; Notable for the  following components:   WBC 2.9 (*)    MCV 104.9 (*)    All other components within normal limits  SURGICAL PCR SCREEN     IMAGES:   EKG:   CV: Echo 05/17/23 1. Left ventricular ejection fraction, by estimation, is 60 to 65%. The  left ventricle has normal function. The left ventricle has no regional  wall motion abnormalities. There is mild concentric left ventricular  hypertrophy. Left ventricular diastolic  parameters are indeterminate.   2. Right ventricular systolic function is normal. The right ventricular  size is normal. Tricuspid regurgitation signal is inadequate for assessing  PA pressure.   3. Left atrial size was severely dilated.   4. The mitral valve is normal in structure. No evidence of mitral valve  regurgitation. No evidence of mitral stenosis.   5. The aortic valve is normal in structure. Aortic valve regurgitation is  not visualized. No aortic stenosis is present.   6. The inferior vena cava is normal in size with greater than 50%  respiratory variability, suggesting right atrial pressure of 3 mmHg.  Past Medical History:  Diagnosis Date   Arthritis    Blood dyscrasia    hemachromatosis   Bone loss of mandible    BPH (benign prostatic hypertrophy)    CHF (congestive heart failure) (HCC)    Degenerative disc disease    Diverticulosis    Hemochromatosis  HTN (hypertension)    Hyperlipidemia    Multiple myeloma (HCC)    Neuromuscular disorder (HCC)    Occasional tremors    takes Primadone   Polio    as a child   Pulmonary embolism (HCC) 07/2021   after  Left TKA   Status post Nissen fundoplication (without gastrostomy tube) procedure    for hiatal hernia.    Past Surgical History:  Procedure Laterality Date   EYE SURGERY Bilateral    cataracts   FOOT SURGERY     HIATAL HERNIA REPAIR     OTHER SURGICAL HISTORY     Cataract Surgery--Both eyes   SKIN BIOPSY Right 04/13/2022   squamous cell carcinoma in stiu arising in actinic keratosis    TOTAL KNEE ARTHROPLASTY Left 07/15/2021   Procedure: TOTAL KNEE ARTHROPLASTY;  Surgeon: Jesse Chew, MD;  Location: [facility];  Service: Orthopedics;  Laterality: Left;   Undescended testes Right 10/05/1966    MEDICATIONS:  acyclovir  (ZOVIRAX ) 400 MG tablet   atorvastatin  (LIPITOR) 20 MG tablet   calcium  carbonate (TUMS - DOSED IN MG ELEMENTAL CALCIUM ) 500 MG chewable tablet   Cholecalciferol  (VITAMIN D ) 50 MCG (2000 UT) tablet   empagliflozin  (JARDIANCE ) 10 MG TABS tablet   folic acid  (FOLVITE ) 400 MCG tablet   furosemide  (LASIX ) 40 MG tablet   ixazomib citrate  (NINLARO ) 3 MG capsule   losartan  (COZAAR ) 100 MG tablet   Multiple Vitamin (MULTI-VITAMIN) tablet   oxyCODONE  (OXY IR/ROXICODONE ) 5 MG immediate release tablet   Polyethyl Glycol-Propyl Glycol (SYSTANE OP)   pomalidomide  (POMALYST ) 3 MG capsule   primidone  (MYSOLINE ) 50 MG tablet   prochlorperazine  (COMPAZINE ) 10 MG tablet   rivaroxaban  (XARELTO ) 10 MG TABS tablet   vitamin B-12 (CYANOCOBALAMIN ) 1000 MCG tablet   No current facility-administered medications for this encounter.    Jesse Hoots Ward, PA-C [facility] [phone]

## 2024-06-21 NOTE — Care Plan (Signed)
 Ortho Bundle Case Management Note  Patient Details  Name: Jesse Mcclure. MRN: [id number] Date of Birth: 24-Jan-1951  met with patient in the office. he will discharge to home with family to assist. has DME. OPPT set up with [facility]. discharge instructions discussed and questions answered.  Patient and MD in agreement with plan. Choice offered                     DME Arranged:    DME Agency:     HH Arranged:    HH Agency:     Additional Comments: Please contact me with any questions of if this plan should need to change.  Charlies Pitch,  RN,BSN,MHA,CCM  [facility] Orthopaedic Specialist  [phone] 06/21/2024, 9:19 AM

## 2024-06-22 ENCOUNTER — Other Ambulatory Visit: Payer: Self-pay | Admitting: Hematology and Oncology

## 2024-06-22 ENCOUNTER — Other Ambulatory Visit (HOSPITAL_COMMUNITY): Payer: Self-pay

## 2024-06-22 DIAGNOSIS — C9000 Multiple myeloma not having achieved remission: Secondary | ICD-10-CM

## 2024-06-22 MED ORDER — ACYCLOVIR 400 MG PO TABS
400.0000 mg | ORAL_TABLET | Freq: Every day | ORAL | 1 refills | Status: AC
  Filled 2024-06-22: qty 90, 90d supply, fill #0
  Filled 2024-09-18: qty 90, 90d supply, fill #1

## 2024-06-26 NOTE — H&P (Signed)
 KNEE ARTHROPLASTY ADMISSION H&P  Patient ID: Jesse Mcclure. MRN: [id number] DOB/AGE: 73-Jan-1952 73 y.o.  Chief Complaint: right knee pain.  Planned Procedure Date: 06/27/24 Medical Clearance by Dr. Joyce Cardiac Clearance by Lorette Kapur, PA-C Additional clearance by Dr. Lonn (oncology)   HPI: Jesse Mcclure. is a 73 y.o. adult who presents for evaluation of osteoarthritis of right knee. The patient has a history of pain and functional disability in the right knee due to arthritis and has failed non-surgical conservative treatments for greater than 12 weeks to include NSAID's and/or analgesics, corticosteriod injections, and activity modification.  Onset of symptoms was gradual, starting 1 years ago with rapidlly worsening course since that time. The patient noted no past surgery on the right knee.  Patient currently rates pain at 8 out of 10 with activity. Patient has worsening of pain with activity and weight bearing and pain that interferes with activities of daily living.  Patient has evidence of joint space narrowing by imaging studies.  There is no active infection.  Past Medical History:  Diagnosis Date   Arthritis    Blood dyscrasia    hemachromatosis   Bone loss of mandible    BPH (benign prostatic hypertrophy)    CHF (congestive heart failure) (HCC)    Degenerative disc disease    Diverticulosis    Hemochromatosis    HTN (hypertension)    Hyperlipidemia    Multiple myeloma (HCC)    Neuromuscular disorder (HCC)    Occasional tremors    takes Primadone   Polio    as a child   Pulmonary embolism (HCC) 07/2021   after  Left TKA   Status post Nissen fundoplication (without gastrostomy tube) procedure    for hiatal hernia.   Past Surgical History:  Procedure Laterality Date   EYE SURGERY Bilateral    cataracts   FOOT SURGERY     HIATAL HERNIA REPAIR     OTHER SURGICAL HISTORY     Cataract Surgery--Both eyes   SKIN BIOPSY Right 04/13/2022   squamous  cell carcinoma in stiu arising in actinic keratosis   TOTAL KNEE ARTHROPLASTY Left 07/15/2021   Procedure: TOTAL KNEE ARTHROPLASTY;  Surgeon: Josefina Chew, MD;  Location[facility];  Service: Orthopedics;  Laterality: Left;   Undescended testes Right 10/05/1966   No Known Allergies Prior to Admission medications   Medication Sig Start Date End Date Taking? Authorizing Provider  atorvastatin  (LIPITOR) 20 MG tablet Take 1 tablet (20 mg total) by mouth daily. 05/09/24  Yes [name], MD  calcium  carbonate (TUMS - DOSED IN MG ELEMENTAL CALCIUM ) 500 MG chewable tablet Chew 1 tablet by mouth 2 (two) times daily.   Yes [provider]  Cholecalciferol  (VITAMIN D ) 50 MCG (2000 UT) tablet Take 2,000 Units by mouth daily.   Yes [provider]  empagliflozin  (JARDIANCE ) 10 MG TABS tablet Take 1 tablet (10 mg total) by mouth daily. 05/09/24  Yes [name], MD  folic acid  (FOLVITE ) 400 MCG tablet Take 400 mcg by mouth daily.   Yes [provider]  furosemide  (LASIX ) 40 MG tablet Take 1 tablet (40 mg total) by mouth daily. 11/08/23  Yes Gorsuch, Ni, MD  ixazomib citrate  (NINLARO ) 3 MG capsule Take 1 capsule (3 mg) by mouth weekly, 3 weeks on, 1 week off, repeat every 4 weeks. Take on an empty stomach 1hr before or 2hr after meals. 10/15/23  Yes Gorsuch, Ni, MD  losartan  (COZAAR ) 100 MG tablet Take  1 tablet (100 mg total) by mouth daily. 05/09/24 05/09/25 Yes [name], MD  Multiple Vitamin (MULTI-VITAMIN) tablet Take 1 tablet by mouth daily. 08/30/19  Yes [provider]  Polyethyl Glycol-Propyl Glycol (SYSTANE OP) Place 1 drop into both eyes daily as needed (dry eyes).   Yes [provider]  pomalidomide  (POMALYST ) 3 MG capsule Take 1 capsule (3 mg total) by mouth daily for 21 days. [employer] Auth # [id number]     Date Obtained 06/08/2024 06/08/24 06/29/24 Yes Lonn Hicks, MD  primidone  (MYSOLINE ) 50 MG tablet Take 1 tablet (50 mg total) by mouth at  bedtime. Patient taking differently: Take 50 mg by mouth at bedtime as needed (TREMORS). 08/06/23  Yes Vaslow, Zachary K, MD  prochlorperazine  (COMPAZINE ) 10 MG tablet TAKE 1 TABLET(10 MG) BY MOUTH EVERY 6 HOURS AS NEEDED FOR NAUSEA OR VOMITING 03/13/24  Yes Gorsuch, Ni, MD  rivaroxaban  (XARELTO ) 10 MG TABS tablet Take 1 tablet (10 mg total) by mouth daily with supper. 08/12/23  Yes Gorsuch, Ni, MD  vitamin B-12 (CYANOCOBALAMIN ) 1000 MCG tablet Take 1,000 mcg by mouth daily.   Yes [provider]  acyclovir  (ZOVIRAX ) 400 MG tablet Take 1 tablet (400 mg total) by mouth daily. 06/22/24   Lonn Hicks, MD  oxyCODONE  (OXY IR/ROXICODONE ) 5 MG immediate release tablet Take 1 tablet (5 mg total) by mouth every 4 (four) hours as needed for severe pain (pain score 7-10). 06/19/24   Lonn Hicks, MD   Social History   Socioeconomic History   Marital status: Single    Spouse name: Not on file   Number of children: Not on file   Years of education: Not on file   Highest education level: 12th grade  Occupational History   Not on file  Tobacco Use   Smoking status: Never   Smokeless tobacco: Never  Vaping Use   Vaping status: Never Used  Substance and Sexual Activity   Alcohol  use: Not Currently    Alcohol /week: 4.0 standard drinks of alcohol     Types: 4 drink(s) per week    Comment: occasionally   Drug use: Yes    Types: Oxycodone    Sexual activity: Yes  Other Topics Concern   Not on file  Social History Narrative   Not on file   Social Drivers of Health   Financial Resource Strain: Low Risk  (12/31/2023)   Overall Financial Resource Strain (CARDIA)    Difficulty of Paying Living Expenses: Not hard at all  Food Insecurity: No Food Insecurity (12/31/2023)   Hunger Vital Sign    Worried About Running Out of Food in the Last Year: Never true    Ran Out of Food in the Last Year: Never true  Transportation Needs: No Transportation Needs (12/31/2023)   PRAPARE - Scientist, research (physical sciences) (Medical): No    Lack of Transportation (Non-Medical): No  Physical Activity: Insufficiently Active (12/31/2023)   Exercise Vital Sign    Days of Exercise per Week: 2 days    Minutes of Exercise per Session: 30 min  Stress: No Stress Concern Present (12/31/2023)   [employer] of Occupational Health - Occupational Stress Questionnaire    Feeling of Stress : Only a little  Social Connections: Socially Integrated (12/31/2023)   Social Connection and Isolation Panel    Frequency of Communication with Friends and Family: Twice a week    Frequency of Social Gatherings with Friends and Family: Once a week    Attends Religious  Services: 1 to 4 times per year    Active Member of Clubs or Organizations: Yes    Attends Banker Meetings: Never    Marital Status: Living with partner   Family History  Problem Relation Age of Onset   Breast cancer Mother    Arrhythmia Father    Heart failure Father    Breast cancer Sister     ROS: Currently denies lightheadedness, dizziness, Fever, chills, CP, SOB.   No personal history of DVT, PE, MI, or CVA. No loose teeth or dentures All other systems have been reviewed and were otherwise currently negative with the exception of those mentioned in the HPI and as above.  Objective: Vitals: Height 5 feet 7 inches, weight 235 pounds, BP 140/89, pulse 50, temperature 97.7, O2 94% on room air.  Physical Exam: General: Alert, NAD.  Antalgic Gait  HEENT: EOMI, Good Neck Extension  Pulm: No increased work of breathing.  Clear B/L A/P w/o crackle or wheeze.  CV: RRR, No m/g/r appreciated  GI: soft, NT, ND Neuro: Neuro without gross focal deficit.  Sensation intact distally Skin: No lesions in the area of chief complaint MSK/Surgical Site: Right knee w/o redness or effusion.  He has 10 to 120 degrees of range of motion of the right knee. Positive medial and lateral joint line tenderness. EHL and FHL are intact. Distal sensation  intact. 2+ PT pulse.   Imaging Review Plain radiographs demonstrate severe degenerative joint disease of the right knee.   Preoperative templating of the joint replacement has been completed, documented, and submitted to the Operating Room personnel in order to optimize intra-operative equipment management.  Assessment: osteoarthritis of right knee  Plan: Plan for Procedure(s): ARTHROPLASTY, KNEE, TOTAL  The patient history, physical exam, clinical judgement of the provider and imaging are consistent with end stage degenerative joint disease and total joint arthroplasty is deemed medically necessary. The treatment options including medical management, injection therapy, and arthroplasty were discussed at length. The risks and benefits of Procedure(s): ARTHROPLASTY, KNEE, TOTAL were presented and reviewed.  The risks of nonoperative treatment, versus surgical intervention including but not limited to continued pain, aseptic loosening, stiffness, dislocation/subluxation, infection, bleeding, nerve injury, blood clots, cardiopulmonary complications, morbidity, mortality, among others were discussed. The patient verbalizes understanding and wishes to proceed with the plan.  Patient is being admitted for inpatient treatment for surgery, pain control, PT, prophylactic antibiotics, VTE prophylaxis, progressive ambulation, ADL's and discharge planning.   The patient does not meet the criteria for TXA which will be used perioperatively.   Home Xarelto  will be used postoperatively for DVT prophylaxis in addition to SCDs, and early ambulation. The patient is planning to be discharged home with OPPT in care of family    Jalesha Plotz K Chrysten Woulfe, PA-C 06/26/2024 1:10 PM

## 2024-06-27 ENCOUNTER — Ambulatory Visit: Admitting: Hematology and Oncology

## 2024-06-27 ENCOUNTER — Other Ambulatory Visit: Payer: Self-pay

## 2024-06-27 ENCOUNTER — Encounter (HOSPITAL_COMMUNITY): Payer: Self-pay | Admitting: Orthopedic Surgery

## 2024-06-27 ENCOUNTER — Observation Stay (HOSPITAL_COMMUNITY)

## 2024-06-27 ENCOUNTER — Other Ambulatory Visit

## 2024-06-27 ENCOUNTER — Encounter (HOSPITAL_COMMUNITY)
Admission: RE | Disposition: A | Discharge status: Home / Self Care | Payer: Self-pay | Source: Home / Self Care | Attending: Orthopedic Surgery

## 2024-06-27 ENCOUNTER — Inpatient Hospital Stay (HOSPITAL_COMMUNITY)
Admission: RE | Admit: 2024-06-27 | Discharge: 2024-06-29 | DRG: 982 | Disposition: A | Discharge status: Home / Self Care | Attending: Orthopedic Surgery | Admitting: Orthopedic Surgery

## 2024-06-27 ENCOUNTER — Ambulatory Visit (HOSPITAL_COMMUNITY): Payer: Self-pay | Admitting: Anesthesiology

## 2024-06-27 ENCOUNTER — Ambulatory Visit (HOSPITAL_COMMUNITY): Payer: Self-pay | Admitting: Physician Assistant

## 2024-06-27 DIAGNOSIS — I5030 Unspecified diastolic (congestive) heart failure: Secondary | ICD-10-CM

## 2024-06-27 DIAGNOSIS — M25761 Osteophyte, right knee: Secondary | ICD-10-CM | POA: Diagnosis not present

## 2024-06-27 DIAGNOSIS — Z9481 Bone marrow transplant status: Secondary | ICD-10-CM

## 2024-06-27 DIAGNOSIS — I10 Essential (primary) hypertension: Secondary | ICD-10-CM | POA: Diagnosis present

## 2024-06-27 DIAGNOSIS — I440 Atrioventricular block, first degree: Secondary | ICD-10-CM

## 2024-06-27 DIAGNOSIS — M8588 Other specified disorders of bone density and structure, other site: Secondary | ICD-10-CM | POA: Diagnosis present

## 2024-06-27 DIAGNOSIS — I3139 Other pericardial effusion (noninflammatory): Secondary | ICD-10-CM | POA: Diagnosis not present

## 2024-06-27 DIAGNOSIS — Z85828 Personal history of other malignant neoplasm of skin: Secondary | ICD-10-CM

## 2024-06-27 DIAGNOSIS — Z8719 Personal history of other diseases of the digestive system: Secondary | ICD-10-CM

## 2024-06-27 DIAGNOSIS — R609 Edema, unspecified: Secondary | ICD-10-CM | POA: Diagnosis not present

## 2024-06-27 DIAGNOSIS — E785 Hyperlipidemia, unspecified: Secondary | ICD-10-CM | POA: Diagnosis not present

## 2024-06-27 DIAGNOSIS — I110 Hypertensive heart disease with heart failure: Secondary | ICD-10-CM

## 2024-06-27 DIAGNOSIS — G8918 Other acute postprocedural pain: Secondary | ICD-10-CM | POA: Diagnosis not present

## 2024-06-27 DIAGNOSIS — M545 Low back pain, unspecified: Secondary | ICD-10-CM | POA: Diagnosis present

## 2024-06-27 DIAGNOSIS — D539 Nutritional anemia, unspecified: Secondary | ICD-10-CM | POA: Diagnosis present

## 2024-06-27 DIAGNOSIS — N400 Enlarged prostate without lower urinary tract symptoms: Secondary | ICD-10-CM | POA: Diagnosis present

## 2024-06-27 DIAGNOSIS — D630 Anemia in neoplastic disease: Secondary | ICD-10-CM | POA: Diagnosis not present

## 2024-06-27 DIAGNOSIS — I4892 Unspecified atrial flutter: Principal | ICD-10-CM | POA: Diagnosis present

## 2024-06-27 DIAGNOSIS — G8929 Other chronic pain: Secondary | ICD-10-CM | POA: Diagnosis not present

## 2024-06-27 DIAGNOSIS — I5032 Chronic diastolic (congestive) heart failure: Secondary | ICD-10-CM | POA: Diagnosis not present

## 2024-06-27 DIAGNOSIS — Z803 Family history of malignant neoplasm of breast: Secondary | ICD-10-CM

## 2024-06-27 DIAGNOSIS — Z471 Aftercare following joint replacement surgery: Secondary | ICD-10-CM | POA: Diagnosis not present

## 2024-06-27 DIAGNOSIS — Z6834 Body mass index (BMI) 34.0-34.9, adult: Secondary | ICD-10-CM

## 2024-06-27 DIAGNOSIS — C9000 Multiple myeloma not having achieved remission: Secondary | ICD-10-CM | POA: Diagnosis present

## 2024-06-27 DIAGNOSIS — I480 Paroxysmal atrial fibrillation: Secondary | ICD-10-CM | POA: Diagnosis present

## 2024-06-27 DIAGNOSIS — Z8249 Family history of ischemic heart disease and other diseases of the circulatory system: Secondary | ICD-10-CM

## 2024-06-27 DIAGNOSIS — T451X5A Adverse effect of antineoplastic and immunosuppressive drugs, initial encounter: Secondary | ICD-10-CM | POA: Diagnosis not present

## 2024-06-27 DIAGNOSIS — G620 Drug-induced polyneuropathy: Secondary | ICD-10-CM | POA: Diagnosis present

## 2024-06-27 DIAGNOSIS — G250 Essential tremor: Secondary | ICD-10-CM | POA: Diagnosis not present

## 2024-06-27 DIAGNOSIS — Z9842 Cataract extraction status, left eye: Secondary | ICD-10-CM

## 2024-06-27 DIAGNOSIS — Z8612 Personal history of poliomyelitis: Secondary | ICD-10-CM

## 2024-06-27 DIAGNOSIS — Z86711 Personal history of pulmonary embolism: Secondary | ICD-10-CM | POA: Diagnosis not present

## 2024-06-27 DIAGNOSIS — I272 Pulmonary hypertension, unspecified: Secondary | ICD-10-CM | POA: Diagnosis not present

## 2024-06-27 DIAGNOSIS — M1711 Unilateral primary osteoarthritis, right knee: Secondary | ICD-10-CM | POA: Diagnosis present

## 2024-06-27 DIAGNOSIS — Z79624 Long term (current) use of inhibitors of nucleotide synthesis: Secondary | ICD-10-CM

## 2024-06-27 DIAGNOSIS — Z96652 Presence of left artificial knee joint: Secondary | ICD-10-CM | POA: Diagnosis present

## 2024-06-27 DIAGNOSIS — Z9889 Other specified postprocedural states: Secondary | ICD-10-CM

## 2024-06-27 DIAGNOSIS — I483 Typical atrial flutter: Secondary | ICD-10-CM

## 2024-06-27 DIAGNOSIS — Z7901 Long term (current) use of anticoagulants: Secondary | ICD-10-CM

## 2024-06-27 DIAGNOSIS — Z86718 Personal history of other venous thrombosis and embolism: Secondary | ICD-10-CM

## 2024-06-27 DIAGNOSIS — Z96651 Presence of right artificial knee joint: Secondary | ICD-10-CM | POA: Diagnosis not present

## 2024-06-27 DIAGNOSIS — Z9841 Cataract extraction status, right eye: Secondary | ICD-10-CM

## 2024-06-27 DIAGNOSIS — I441 Atrioventricular block, second degree: Secondary | ICD-10-CM | POA: Diagnosis not present

## 2024-06-27 DIAGNOSIS — E669 Obesity, unspecified: Secondary | ICD-10-CM | POA: Diagnosis present

## 2024-06-27 DIAGNOSIS — Z79899 Other long term (current) drug therapy: Secondary | ICD-10-CM

## 2024-06-27 DIAGNOSIS — Z7984 Long term (current) use of oral hypoglycemic drugs: Secondary | ICD-10-CM

## 2024-06-27 DIAGNOSIS — M549 Dorsalgia, unspecified: Secondary | ICD-10-CM | POA: Diagnosis present

## 2024-06-27 HISTORY — PX: TOTAL KNEE ARTHROPLASTY: SHX125

## 2024-06-27 LAB — GLUCOSE, CAPILLARY
Glucose-Capillary: 216 mg/dL — ABNORMAL HIGH (ref 70–99)
Glucose-Capillary: 78 mg/dL (ref 70–99)
Glucose-Capillary: 132 mg/dL — ABNORMAL HIGH (ref 70–99)

## 2024-06-27 LAB — BASIC METABOLIC PANEL WITH GFR
Anion gap: 14 (ref 5–15)
BUN: 15 mg/dL (ref 8–23)
CO2: 20 mmol/L — ABNORMAL LOW (ref 22–32)
Calcium: 8.9 mg/dL (ref 8.9–10.3)
Chloride: 103 mmol/L (ref 98–111)
Creatinine, Ser: 0.83 mg/dL (ref 0.61–1.24)
GFR, Estimated: >60 mL/min (ref >60)
Glucose, Bld: 163 mg/dL — ABNORMAL HIGH (ref 70–99)
Potassium: 4.1 mmol/L (ref 3.5–5.1)
Sodium: 137 mmol/L (ref 135–145)

## 2024-06-27 LAB — CBC
HCT: 48.6 % (ref 39.0–52.0)
Hemoglobin: 15.9 g/dL (ref 13.0–17.0)
MCH: 34.6 pg — ABNORMAL HIGH (ref 26.0–34.0)
MCHC: 32.7 g/dL (ref 30.0–36.0)
MCV: 105.7 fL — ABNORMAL HIGH (ref 80.0–100.0)
Platelets: 200 K/uL (ref 150–400)
RBC: 4.6 MIL/uL (ref 4.22–5.81)
RDW: 15 % (ref 11.5–15.5)
WBC: 5.5 K/uL (ref 4.0–10.5)
nRBC: 0 % (ref 0.0–0.2)

## 2024-06-27 LAB — TSH: TSH: 1.78 u[IU]/mL (ref 0.350–4.500)

## 2024-06-27 SURGERY — ARTHROPLASTY, KNEE, TOTAL
Anesthesia: Regional | Site: Knee | Laterality: Right

## 2024-06-27 MED ORDER — CHLORHEXIDINE GLUCONATE 0.12 % MT SOLN: 15.0000 mL | Freq: Once | OROMUCOSAL | Status: DC

## 2024-06-27 MED ORDER — LACTATED RINGERS IV SOLN: INTRAVENOUS | Status: DC

## 2024-06-27 MED ORDER — ACYCLOVIR 400 MG PO TABS
400.0000 mg | ORAL_TABLET | Freq: Every day | ORAL | Status: DC
  Administered 2024-06-28 – 2024-06-29 (×2): 400 mg via ORAL
  Filled 2024-06-27 (×2): qty 1

## 2024-06-27 MED ORDER — FENTANYL CITRATE (PF) 250 MCG/5ML IJ SOLN
INTRAMUSCULAR | Status: AC
  Filled 2024-06-27: qty 5

## 2024-06-27 MED ORDER — PROPOFOL 10 MG/ML IV BOLUS
INTRAVENOUS | Status: AC
  Filled 2024-06-27: qty 20

## 2024-06-27 MED ORDER — HYDROMORPHONE HCL 1 MG/ML IJ SOLN
INTRAMUSCULAR | Status: AC
  Filled 2024-06-27: qty 1

## 2024-06-27 MED ORDER — PHENOL 1.4 % MT LIQD: 1.0000 | OROMUCOSAL | Status: DC | PRN

## 2024-06-27 MED ORDER — SODIUM CHLORIDE (PF) 0.9 % IJ SOLN
INTRAMUSCULAR | Status: AC
  Filled 2024-06-27: qty 10

## 2024-06-27 MED ORDER — PHENYLEPHRINE HCL-NACL 20-0.9 MG/250ML-% IV SOLN
INTRAVENOUS | Status: DC | PRN
  Administered 2024-06-27: 25 ug/min via INTRAVENOUS

## 2024-06-27 MED ORDER — ACETAMINOPHEN 325 MG PO TABS: 325.0000 mg | ORAL_TABLET | Freq: Four times a day (QID) | ORAL | Status: DC | PRN

## 2024-06-27 MED ORDER — PHENYLEPHRINE 80 MCG/ML (10ML) SYRINGE FOR IV PUSH (FOR BLOOD PRESSURE SUPPORT)
PREFILLED_SYRINGE | INTRAVENOUS | Status: DC | PRN
  Administered 2024-06-27: 80 ug via INTRAVENOUS

## 2024-06-27 MED ORDER — SUGAMMADEX SODIUM 200 MG/2ML IV SOLN
INTRAVENOUS | Status: AC
  Filled 2024-06-27: qty 4

## 2024-06-27 MED ORDER — PHENYLEPHRINE 80 MCG/ML (10ML) SYRINGE FOR IV PUSH (FOR BLOOD PRESSURE SUPPORT)
PREFILLED_SYRINGE | INTRAVENOUS | Status: AC
  Filled 2024-06-27: qty 10

## 2024-06-27 MED ORDER — HYDROMORPHONE HCL 1 MG/ML IJ SOLN
0.5000 mg | INTRAMUSCULAR | Status: DC | PRN
  Administered 2024-06-27 – 2024-06-28 (×2): 0.5 mg via INTRAVENOUS
  Administered 2024-06-28 – 2024-06-29 (×2): 1 mg via INTRAVENOUS
  Filled 2024-06-27 (×4): qty 1

## 2024-06-27 MED ORDER — HYDROMORPHONE HCL 1 MG/ML IJ SOLN
1.0000 mg | Freq: Once | INTRAMUSCULAR | Status: AC
  Administered 2024-06-27: 1 mg via INTRAVENOUS

## 2024-06-27 MED ORDER — ALUM & MAG HYDROXIDE-SIMETH 200-200-20 MG/5ML PO SUSP: 30.0000 mL | ORAL | Status: DC | PRN

## 2024-06-27 MED ORDER — ACETAMINOPHEN 500 MG PO TABS
1000.0000 mg | ORAL_TABLET | Freq: Four times a day (QID) | ORAL | Status: AC
  Administered 2024-06-28 (×4): 1000 mg via ORAL
  Filled 2024-06-27 (×4): qty 2

## 2024-06-27 MED ORDER — OXYCODONE HCL 5 MG PO TABS
ORAL_TABLET | ORAL | Status: AC
  Filled 2024-06-27: qty 1

## 2024-06-27 MED ORDER — MIDAZOLAM HCL 2 MG/2ML IJ SOLN
1.0000 mg | INTRAMUSCULAR | Status: DC
  Administered 2024-06-27 (×2): 1 mg via INTRAVENOUS

## 2024-06-27 MED ORDER — ONDANSETRON HCL 4 MG/2ML IJ SOLN
INTRAMUSCULAR | Status: DC | PRN
  Administered 2024-06-27: 4 mg via INTRAVENOUS

## 2024-06-27 MED ORDER — PROPOFOL 10 MG/ML IV BOLUS
INTRAVENOUS | Status: DC | PRN
Start: 2024-06-27 — End: 2024-06-27
  Administered 2024-06-27: 200 mg via INTRAVENOUS

## 2024-06-27 MED ORDER — METOCLOPRAMIDE HCL 5 MG/ML IJ SOLN: 5.0000 mg | Freq: Three times a day (TID) | INTRAMUSCULAR | Status: DC | PRN

## 2024-06-27 MED ORDER — POVIDONE-IODINE 10 % EX SWAB: 2.0000 | Freq: Once | CUTANEOUS | Status: DC

## 2024-06-27 MED ORDER — POLYETHYL GLYCOL-PROPYL GLYCOL 0.4-0.3 % OP GEL
Freq: Every day | OPHTHALMIC | Status: DC | PRN
Start: 2024-06-27 — End: 2024-06-27

## 2024-06-27 MED ORDER — ACETAMINOPHEN 10 MG/ML IV SOLN
1000.0000 mg | Freq: Once | INTRAVENOUS | Status: DC | PRN
  Administered 2024-06-27: 1000 mg via INTRAVENOUS

## 2024-06-27 MED ORDER — DEXAMETHASONE SODIUM PHOSPHATE 10 MG/ML IJ SOLN
INTRAMUSCULAR | Status: DC | PRN
  Administered 2024-06-27: 10 mg via INTRAVENOUS

## 2024-06-27 MED ORDER — OXYCODONE HCL 5 MG PO TABS
10.0000 mg | ORAL_TABLET | ORAL | Status: DC | PRN
  Administered 2024-06-28 – 2024-06-29 (×5): 10 mg via ORAL
  Filled 2024-06-27 (×4): qty 2

## 2024-06-27 MED ORDER — MIDAZOLAM HCL 2 MG/2ML IJ SOLN
INTRAMUSCULAR | Status: AC
  Filled 2024-06-27: qty 2

## 2024-06-27 MED ORDER — METHOCARBAMOL 500 MG PO TABS
500.0000 mg | ORAL_TABLET | Freq: Four times a day (QID) | ORAL | Status: DC | PRN
  Administered 2024-06-28: 500 mg via ORAL
  Filled 2024-06-27: qty 1

## 2024-06-27 MED ORDER — DOCUSATE SODIUM 100 MG PO CAPS
100.0000 mg | ORAL_CAPSULE | Freq: Two times a day (BID) | ORAL | Status: DC
  Administered 2024-06-27 – 2024-06-29 (×4): 100 mg via ORAL
  Filled 2024-06-27 (×4): qty 1

## 2024-06-27 MED ORDER — INSULIN ASPART 100 UNIT/ML IJ SOLN
0.0000 [IU] | INTRAMUSCULAR | Status: DC | PRN
  Administered 2024-06-27: 2 [IU] via SUBCUTANEOUS
  Filled 2024-06-27: qty 1

## 2024-06-27 MED ORDER — ONDANSETRON HCL 4 MG/2ML IJ SOLN
INTRAMUSCULAR | Status: AC
  Filled 2024-06-27: qty 2

## 2024-06-27 MED ORDER — EPHEDRINE 5 MG/ML INJ
INTRAVENOUS | Status: AC
  Filled 2024-06-27: qty 5

## 2024-06-27 MED ORDER — POVIDONE-IODINE 7.5 % EX SOLN: Freq: Once | CUTANEOUS | Status: DC

## 2024-06-27 MED ORDER — ATORVASTATIN CALCIUM 20 MG PO TABS
20.0000 mg | ORAL_TABLET | Freq: Every day | ORAL | Status: DC
  Administered 2024-06-28 – 2024-06-29 (×2): 20 mg via ORAL
  Filled 2024-06-27 (×2): qty 1

## 2024-06-27 MED ORDER — PRIMIDONE 50 MG PO TABS: 50.0000 mg | ORAL_TABLET | Freq: Every evening | ORAL | Status: DC | PRN

## 2024-06-27 MED ORDER — BUPIVACAINE-EPINEPHRINE (PF) 0.5% -1:200000 IJ SOLN
INTRAMUSCULAR | Status: DC | PRN
  Administered 2024-06-27: 30 mL via PERINEURAL

## 2024-06-27 MED ORDER — EMPAGLIFLOZIN 10 MG PO TABS
10.0000 mg | ORAL_TABLET | Freq: Every day | ORAL | Status: DC
  Administered 2024-06-28 – 2024-06-29 (×2): 10 mg via ORAL
  Filled 2024-06-27 (×2): qty 1

## 2024-06-27 MED ORDER — HYDROMORPHONE HCL 1 MG/ML IJ SOLN
INTRAMUSCULAR | Status: DC | PRN
  Administered 2024-06-27 (×2): .2 mg via INTRAVENOUS
  Administered 2024-06-27: .4 mg via INTRAVENOUS
  Administered 2024-06-27 (×3): .2 mg via INTRAVENOUS
  Administered 2024-06-27: .4 mg via INTRAVENOUS
  Administered 2024-06-27: .2 mg via INTRAVENOUS

## 2024-06-27 MED ORDER — MAGNESIUM CITRATE PO SOLN: 1.0000 | Freq: Once | ORAL | Status: DC | PRN

## 2024-06-27 MED ORDER — RIVAROXABAN 10 MG PO TABS: 10.0000 mg | ORAL_TABLET | Freq: Every day | ORAL | Status: DC

## 2024-06-27 MED ORDER — METOCLOPRAMIDE HCL 5 MG PO TABS: 5.0000 mg | ORAL_TABLET | Freq: Three times a day (TID) | ORAL | Status: DC | PRN

## 2024-06-27 MED ORDER — CALCIUM CARBONATE ANTACID 500 MG PO CHEW
1.0000 | CHEWABLE_TABLET | Freq: Two times a day (BID) | ORAL | Status: DC
  Administered 2024-06-27 – 2024-06-29 (×4): 200 mg via ORAL
  Filled 2024-06-27 (×4): qty 1

## 2024-06-27 MED ORDER — FENTANYL CITRATE (PF) 250 MCG/5ML IJ SOLN
INTRAMUSCULAR | Status: DC | PRN
  Administered 2024-06-27 (×5): 50 ug via INTRAVENOUS

## 2024-06-27 MED ORDER — POLYETHYLENE GLYCOL 3350 17 G PO PACK: 17.0000 g | PACK | Freq: Every day | ORAL | Status: DC | PRN

## 2024-06-27 MED ORDER — BISACODYL 10 MG RE SUPP: 10.0000 mg | Freq: Every day | RECTAL | Status: DC | PRN

## 2024-06-27 MED ORDER — AMISULPRIDE (ANTIEMETIC) 5 MG/2ML IV SOLN: 10.0000 mg | Freq: Once | INTRAVENOUS | Status: DC | PRN

## 2024-06-27 MED ORDER — BUPIVACAINE HCL (PF) 0.5 % IJ SOLN
INTRAMUSCULAR | Status: AC
  Filled 2024-06-27: qty 30

## 2024-06-27 MED ORDER — FENTANYL CITRATE PF 50 MCG/ML IJ SOSY
PREFILLED_SYRINGE | INTRAMUSCULAR | Status: AC
  Filled 2024-06-27: qty 3

## 2024-06-27 MED ORDER — DEXAMETHASONE SODIUM PHOSPHATE 10 MG/ML IJ SOLN
10.0000 mg | Freq: Once | INTRAMUSCULAR | Status: AC
  Administered 2024-06-28: 10 mg via INTRAVENOUS
  Filled 2024-06-27: qty 1

## 2024-06-27 MED ORDER — DIPHENHYDRAMINE HCL 12.5 MG/5ML PO ELIX: 12.5000 mg | ORAL_SOLUTION | ORAL | Status: DC | PRN

## 2024-06-27 MED ORDER — WATER FOR IRRIGATION, STERILE IR SOLN
Status: DC | PRN
  Administered 2024-06-27: 2000 mL

## 2024-06-27 MED ORDER — KETOROLAC TROMETHAMINE 30 MG/ML IJ SOLN
INTRAMUSCULAR | Status: AC
  Filled 2024-06-27: qty 1

## 2024-06-27 MED ORDER — BUPIVACAINE HCL (PF) 0.25 % IJ SOLN
INTRAMUSCULAR | Status: AC
  Filled 2024-06-27: qty 30

## 2024-06-27 MED ORDER — HYDROMORPHONE HCL 2 MG/ML IJ SOLN
INTRAMUSCULAR | Status: AC
  Filled 2024-06-27: qty 1

## 2024-06-27 MED ORDER — CEFAZOLIN SODIUM-DEXTROSE 2-4 GM/100ML-% IV SOLN
2.0000 g | INTRAVENOUS | Status: AC
  Administered 2024-06-27: 2 g via INTRAVENOUS
  Filled 2024-06-27: qty 100

## 2024-06-27 MED ORDER — 0.9 % SODIUM CHLORIDE (POUR BTL) OPTIME
TOPICAL | Status: DC | PRN
  Administered 2024-06-27: 1000 mL

## 2024-06-27 MED ORDER — ONDANSETRON HCL 4 MG/2ML IJ SOLN
4.0000 mg | Freq: Four times a day (QID) | INTRAMUSCULAR | Status: DC | PRN
  Administered 2024-06-27 – 2024-06-29 (×2): 4 mg via INTRAVENOUS
  Filled 2024-06-27 (×2): qty 2

## 2024-06-27 MED ORDER — ACETAMINOPHEN 10 MG/ML IV SOLN
INTRAVENOUS | Status: AC
Start: 2024-06-27 — End: 2024-06-27
  Filled 2024-06-27: qty 100

## 2024-06-27 MED ORDER — ACETAMINOPHEN 500 MG PO TABS
1000.0000 mg | ORAL_TABLET | Freq: Once | ORAL | Status: DC
  Filled 2024-06-27: qty 2

## 2024-06-27 MED ORDER — EPHEDRINE SULFATE-NACL 50-0.9 MG/10ML-% IV SOSY
PREFILLED_SYRINGE | INTRAVENOUS | Status: DC | PRN
  Administered 2024-06-27 (×3): 5 mg via INTRAVENOUS

## 2024-06-27 MED ORDER — OXYCODONE HCL 5 MG PO TABS
5.0000 mg | ORAL_TABLET | ORAL | Status: DC | PRN
  Filled 2024-06-27: qty 2

## 2024-06-27 MED ORDER — KETOROLAC TROMETHAMINE 30 MG/ML IJ SOLN
INTRAMUSCULAR | Status: DC | PRN
  Administered 2024-06-27: 31 mL

## 2024-06-27 MED ORDER — ONDANSETRON HCL 4 MG PO TABS: 4.0000 mg | ORAL_TABLET | Freq: Four times a day (QID) | ORAL | Status: DC | PRN

## 2024-06-27 MED ORDER — VITAMIN D 25 MCG (1000 UNIT) PO TABS
2000.0000 [IU] | ORAL_TABLET | Freq: Every day | ORAL | Status: DC
  Administered 2024-06-28 – 2024-06-29 (×2): 2000 [IU] via ORAL
  Filled 2024-06-27 (×2): qty 2

## 2024-06-27 MED ORDER — ZOLPIDEM TARTRATE 5 MG PO TABS: 5.0000 mg | ORAL_TABLET | Freq: Every evening | ORAL | Status: DC | PRN

## 2024-06-27 MED ORDER — LIDOCAINE HCL (PF) 2 % IJ SOLN
INTRAMUSCULAR | Status: AC
  Filled 2024-06-27: qty 5

## 2024-06-27 MED ORDER — LOSARTAN POTASSIUM 50 MG PO TABS
100.0000 mg | ORAL_TABLET | Freq: Every day | ORAL | Status: DC
  Administered 2024-06-28 – 2024-06-29 (×2): 100 mg via ORAL
  Filled 2024-06-27 (×2): qty 2

## 2024-06-27 MED ORDER — LIDOCAINE HCL (CARDIAC) PF 100 MG/5ML IV SOSY
PREFILLED_SYRINGE | INTRAVENOUS | Status: DC | PRN
  Administered 2024-06-27: 40 mg via INTRAVENOUS

## 2024-06-27 MED ORDER — ONDANSETRON HCL 4 MG/2ML IJ SOLN: 4.0000 mg | Freq: Once | INTRAMUSCULAR | Status: DC | PRN

## 2024-06-27 MED ORDER — MENTHOL 3 MG MT LOZG: 1.0000 | LOZENGE | OROMUCOSAL | Status: DC | PRN

## 2024-06-27 MED ORDER — ORAL CARE MOUTH RINSE: 15.0000 mL | Freq: Once | OROMUCOSAL | Status: DC

## 2024-06-27 MED ORDER — OXYCODONE HCL 5 MG PO TABS
5.0000 mg | ORAL_TABLET | Freq: Once | ORAL | Status: AC
  Administered 2024-06-27: 5 mg via ORAL

## 2024-06-27 MED ORDER — METHOCARBAMOL 1000 MG/10ML IJ SOLN
INTRAMUSCULAR | Status: AC
  Filled 2024-06-27: qty 10

## 2024-06-27 MED ORDER — FENTANYL CITRATE PF 50 MCG/ML IJ SOSY
25.0000 ug | PREFILLED_SYRINGE | INTRAMUSCULAR | Status: DC | PRN
  Administered 2024-06-27 (×3): 50 ug via INTRAVENOUS

## 2024-06-27 MED ORDER — ARTIFICIAL TEARS OPHTHALMIC OINT: TOPICAL_OINTMENT | Freq: Every day | OPHTHALMIC | Status: DC | PRN

## 2024-06-27 MED ORDER — METHOCARBAMOL 1000 MG/10ML IJ SOLN
500.0000 mg | Freq: Four times a day (QID) | INTRAMUSCULAR | Status: DC | PRN
  Administered 2024-06-27: 500 mg via INTRAVENOUS

## 2024-06-27 MED ORDER — POTASSIUM CHLORIDE IN NACL 20-0.9 MEQ/L-% IV SOLN
INTRAVENOUS | Status: DC
  Filled 2024-06-27: qty 1000

## 2024-06-27 MED ORDER — SODIUM CHLORIDE 0.9 % IR SOLN
Status: DC | PRN
  Administered 2024-06-27: 1000 mL

## 2024-06-27 MED ORDER — FENTANYL CITRATE PF 50 MCG/ML IJ SOSY
50.0000 ug | PREFILLED_SYRINGE | INTRAMUSCULAR | Status: DC
  Filled 2024-06-27: qty 2

## 2024-06-27 MED ORDER — ROCURONIUM BROMIDE 10 MG/ML (PF) SYRINGE
PREFILLED_SYRINGE | INTRAVENOUS | Status: AC
  Filled 2024-06-27: qty 10

## 2024-06-27 MED ORDER — FUROSEMIDE 40 MG PO TABS
40.0000 mg | ORAL_TABLET | Freq: Every day | ORAL | Status: DC
  Administered 2024-06-28 – 2024-06-29 (×2): 40 mg via ORAL
  Filled 2024-06-27 (×2): qty 1

## 2024-06-27 SURGICAL SUPPLY — 48 items
ATTUNE MED DOME PAT 38 KNEE (Knees) IMPLANT
ATTUNE PS FEM RT SZ 6 CEM KNEE (Femur) IMPLANT
BAG COUNTER SPONGE SURGICOUNT (BAG) IMPLANT
BAG ZIPLOCK 12X15 (MISCELLANEOUS) IMPLANT
BASEPLATE TIB CMT FB PCKT SZ6 (Knees) IMPLANT
BLADE SAG 18X100X1.27 (BLADE) ×1 IMPLANT
BLADE SAW SGTL 11.0X1.19X90.0M (BLADE) IMPLANT
BLADE SAW SGTL 13X75X1.27 (BLADE) ×1 IMPLANT
BLADE SURG 15 STRL LF DISP TIS (BLADE) ×1 IMPLANT
BNDG ELASTIC 6X10 VLCR STRL LF (GAUZE/BANDAGES/DRESSINGS) ×1 IMPLANT
BOWL SMART MIX CTS (DISPOSABLE) ×1 IMPLANT
CEMENT HV SMART SET (Cement) ×2 IMPLANT
CLSR STERI-STRIP ANTIMIC 1/2X4 (GAUZE/BANDAGES/DRESSINGS) ×2 IMPLANT
COVER SURGICAL LIGHT HANDLE (MISCELLANEOUS) ×1 IMPLANT
CUFF TRNQT CYL 34X4.125X (TOURNIQUET CUFF) ×1 IMPLANT
DRAPE SHEET LG 3/4 BI-LAMINATE (DRAPES) ×1 IMPLANT
DRAPE U-SHAPE 47X51 STRL (DRAPES) ×1 IMPLANT
DRSG MEPILEX POST OP 4X12 (GAUZE/BANDAGES/DRESSINGS) ×1 IMPLANT
DURAPREP 26ML APPLICATOR (WOUND CARE) ×2 IMPLANT
ELECT REM PT RETURN 15FT ADLT (MISCELLANEOUS) ×1 IMPLANT
GAUZE PAD ABD 8X10 STRL (GAUZE/BANDAGES/DRESSINGS) ×2 IMPLANT
GLOVE BIO SURGEON STRL SZ 6.5 (GLOVE) ×1 IMPLANT
GLOVE BIO SURGEON STRL SZ7.5 (GLOVE) ×1 IMPLANT
GLOVE BIOGEL PI IND STRL 7.0 (GLOVE) ×1 IMPLANT
GLOVE BIOGEL PI IND STRL 8 (GLOVE) ×1 IMPLANT
GOWN STRL SURGICAL XL XLNG (GOWN DISPOSABLE) ×2 IMPLANT
HOLDER FOLEY CATH W/STRAP (MISCELLANEOUS) ×1 IMPLANT
IMMOBILIZER KNEE 20 THIGH 36 (SOFTGOODS) ×1 IMPLANT
IMMOBILIZER KNEE 22 UNIV (SOFTGOODS) IMPLANT
INSERT TIB CR FB ATTUNE SZ6X5 (Femur) IMPLANT
KIT TURNOVER KIT A (KITS) ×1 IMPLANT
MANIFOLD NEPTUNE II (INSTRUMENTS) ×1 IMPLANT
NS IRRIG 1000ML POUR BTL (IV SOLUTION) ×1 IMPLANT
PACK TOTAL KNEE CUSTOM (KITS) ×1 IMPLANT
PENCIL SMOKE EVACUATOR (MISCELLANEOUS) ×1 IMPLANT
PIN STEINMAN FIXATION KNEE (PIN) IMPLANT
PROTECTOR NERVE ULNAR (MISCELLANEOUS) ×1 IMPLANT
SET HNDPC FAN SPRY TIP SCT (DISPOSABLE) ×1 IMPLANT
SET PAD KNEE POSITIONER (MISCELLANEOUS) ×1 IMPLANT
SPIKE FLUID TRANSFER (MISCELLANEOUS) IMPLANT
SUT MNCRL AB 3-0 PS2 18 (SUTURE) IMPLANT
SUT STRATAFIX PDS+ 0 24IN (SUTURE) ×1 IMPLANT
SUT VIC AB 2-0 CT1 TAPERPNT 27 (SUTURE) ×2 IMPLANT
SUT VIC AB 3-0 SH 27X BRD (SUTURE) ×2 IMPLANT
TRAY FOLEY MTR SLVR 16FR STAT (SET/KITS/TRAYS/PACK) ×1 IMPLANT
TUBE SUCTION HIGH CAP CLEAR NV (SUCTIONS) ×1 IMPLANT
WATER STERILE IRR 1000ML POUR (IV SOLUTION) ×2 IMPLANT
WRAP KNEE MAXI GEL POST OP (GAUZE/BANDAGES/DRESSINGS) ×1 IMPLANT

## 2024-06-27 NOTE — Op Note (Signed)
 DATE OF SURGERY:  06/27/2024 TIME: 4:40 PM  PATIENT NAME:  Jesse Mcclure.   AGE: 73 y.o.    PRE-OPERATIVE DIAGNOSIS: Right knee primary localized osteoarthritis  POST-OPERATIVE DIAGNOSIS:  Same  PROCEDURE: Right total Knee Arthroplasty  SURGEON:  Fonda SHAUNNA Olmsted, MD   ASSISTANT:  Army Daring, PA-C, present and scrubbed throughout the case, critical for assistance with exposure, retraction, instrumentation, and closure.  OPERATIVE IMPLANTS: Implant Name: CEMENT HV SMART SET - [id number] Type: Cement Inv. Item: CEMENT HV SMART SET Serial No.:  Manufacturer: [employer] Lot No.: [id number] LRB: Right No. Used: 2 Action: Implanted   Implant Name: BASEPLATE TIB CMT FB PCKT SZ6 - [id number] Type: Knees Inv. Item: BASEPLATE TIB CMT FB PCKT SZ6 Serial No.:  Manufacturer: [employer] Lot No.: [id number] LRB: Right No. Used: 1 Action: Implanted   Implant Name: ATTUNE PS FEM RT SZ 6 CEM KNEE - [id number] Type: Femur Inv. Item: ATTUNE PS FEM RT SZ 6 CEM KNEE Serial No.:  Manufacturer: [employer] Lot No.: [id number] LRB: Right No. Used: 1 Action: Implanted   Implant Name: ATTUNE MED DOME PAT 38 KNEE - [id number] Type: Knees Inv. Item: ATTUNE MED DOME PAT 38 KNEE Serial No.:  Manufacturer: [employer] Lot No.: [id number] LRB: Right No. Used: 1 Action: Implanted   Implant Name: INSERT TIB CR FB ATTUNE [id number] - [id number] Type: Femur Inv. Item: INSERT TIB CR FB ATTUNE [id number] Serial No.:  Manufacturer: [employer] Lot No.: [id number] LRB: Right No. Used: 1 Action: Implanted   PREOPERATIVE INDICATIONS:  Jesse Mcclure. is a 73 y.o. year old adult with end stage bone on bone degenerative arthritis of the knee who failed conservative treatment, including injections, antiinflammatories, activity modification, and assistive devices, and had significant impairment of their activities of daily living, and elected for Total Knee  Arthroplasty.   The risks, benefits, and alternatives were discussed at length including but not limited to the risks of infection, bleeding, nerve injury, stiffness, blood clots, the need for revision surgery, cardiopulmonary complications, among others, and they were willing to proceed.  OPERATIVE FINDINGS AND UNIQUE ASPECTS OF THE CASE: He had advanced severe patellofemoral arthrosis with eburnation of the femoral trochlea, flattening of the trochlea, with advanced changes underneath the patella.  The medial and lateral compartments were not so bad, I did cut the femur twice.  The I had to cut the femur on 7 degrees of external rotation because of significant lateral hypoplasia in order to [place].  The motivation I had excellent tracking of the patella at the completion of the case.  He may have been just a degree or 2 shy of full extension, but felt stable in both extension and flexion.  He was having significant bradycardia after induction, but upon stimulation of surgical intervention his cardiac arrhythmia seemed to improve, and he remained stable throughout the case.  He had apparently episodic atrial flutter, which was known before, which he went into during his preoperative evaluation, and anesthesia had consulted with cardiology, who will plan to adjust his medications and consult tomorrow.  We may have the medical team also involved and admit him to telemetry given his new onset arrhythmias, as well as bradycardia, will have to monitor.  ESTIMATED BLOOD LOSS: 150 mL  OPERATIVE DESCRIPTION:  The patient was brought to the operative room and placed in a supine position.  Anesthesia was administered.  IV antibiotics were given.  The lower extremity was prepped and draped in  the usual sterile fashion.  Time out was performed.  The leg was elevated and exsanguinated and the tourniquet was inflated.  Anterior quadriceps tendon splitting approach was performed.  The patella was  everted and osteophytes were removed.  The anterior horn of the medial and lateral meniscus was removed.   The patella was then measured, and cut with the saw.  The thickness before the cut was 21 and after the cut was 14.  A metal shield was used to protect the patella throughout the case.    The distal femur was opened with the drill and the intramedullary distal femoral cutting jig was utilized, set at 5 degrees resecting 9 mm off the distal femur.  Care was taken to protect the collateral ligaments.  Then the extramedullary tibial cutting jig was utilized making the appropriate cut using the anterior tibial crest as a reference building in appropriate posterior slope.  Care was taken during the cut to protect the medial and collateral ligaments.  The proximal tibia was removed along with the posterior horns of the menisci.  The PCL was sacrificed.    The extensor gap was measured and found to have adequate resection, measuring to a size 5, but I cut the femur a second time.    The distal femoral sizing jig was applied, taking care to avoid notching.  This was set at 7 degrees of external rotation.  Then the 4-in-1 cutting jig was applied and the anterior and posterior femur was cut, along with the chamfer cuts.  All posterior osteophytes were removed.  The flexion gap was then measured and was symmetric with the extension gap.  I completed the distal femoral preparation using the appropriate jig to prepare the box.  The proximal tibia sized and prepared accordingly with the reamer and the punch, and then all components were trialed with the poly insert.  The knee was found to have excellent balance and full motion.    The above named components were then cemented into place and all excess cement was removed.  The real polyethylene implant was placed.  After the cement had cured I released the tourniquet and confirmed excellent hemostasis with no major posterior vessel injury.    The knee was  easily taken through a range of motion and the patella tracked well and the knee irrigated copiously and the parapatellar tissue closed with Stratafix and vicryl, and subcutaneous tissue closed with vicryl, and monocryl with steri strips for the skin.  The wounds were injected with marcaine , and dressed with sterile gauze and the patient was awakened and returned to the PACU in stable and satisfactory condition.  There were no complications.  Total tourniquet time was 67 minutes.

## 2024-06-27 NOTE — Progress Notes (Signed)
   Asked by Dr. Patrisha to review an EKG preoperatively for Jesse Mcclure who is planning to undergo knee surgery.  EKG shows atrial flutter with controlled ventricular response and heart rate of 62.  He is asymptomatic and unaware of this.  Have a history of DVT/PE on maintenance low-dose Xarelto  10 mg daily which has been held for surgery.  We will be happy to evaluate tomorrow and will order an echocardiogram.  He will need to be on atrial flutter treatment dose 20 mg Xarelto  daily when at acceptable risk per surgery.  Plan would likely be minimum of 3 weeks of uninterrupted anticoagulation and then consider outpatient elective cardioversion if he is in persistent atrial flutter.  Okay from a cardiac standpoint to proceed with surgery today.  Vinie KYM Maxcy, MD, [facility], FNLA, FACP    [facility]  Medical Director of the [facility]  [facility] Diplomate of the American Board of Clinical Lipidology Attending Cardiologist  Direct Dial: [phone]  Fax: [phone]  Website:  [email or web address]

## 2024-06-27 NOTE — Interval H&P Note (Signed)
 History and Physical Interval Note:  06/27/2024 12:01 PM  Jesse Mcclure.  has presented today for surgery, with the diagnosis of osteoarthritis of right knee.  The various methods of treatment have been discussed with the patient and family. After consideration of risks, benefits and other options for treatment, the patient has consented to  Procedure(s): ARTHROPLASTY, KNEE, TOTAL (Right) as a surgical intervention.  The patient's history has been reviewed, patient examined, no change in status, stable for surgery.  I have reviewed the patient's chart and labs.  Questions were answered to the patient's satisfaction.     Fonda SHAUNNA Olmsted

## 2024-06-27 NOTE — Transfer of Care (Signed)
 Immediate Anesthesia Transfer of Care Note  Patient: Jesse Mcclure.  Procedure(s) Performed: ARTHROPLASTY, KNEE, TOTAL (Right: Knee)  Patient Location: PACU  Anesthesia Type:General  Level of Consciousness: awake  Airway & Oxygen Therapy: Patient Spontanous Breathing and Patient connected to face mask oxygen  Post-op Assessment: Report given to RN and Post -op Vital signs reviewed and stable  Post vital signs: Reviewed and stable  Last Vitals:  Vitals Value Taken Time  BP 163/98 06/27/24 17:19  Temp    Pulse 77 06/27/24 17:22  Resp 13 06/27/24 17:23  SpO2 98 % 06/27/24 17:22  Vitals shown include unfiled device data.  Last Pain:  Vitals:   06/27/24 1257  TempSrc:   PainSc: 0-No pain      Patients Stated Pain Goal: 4 (06/27/24 1056)  Complications: No notable events documented.

## 2024-06-27 NOTE — Progress Notes (Signed)
 Pt noted to have atrial flutter noted on monitor. Dr. Patrisha made aware. 12 lead EKG done. Pt not having any symptoms at this time. Pt is alert, Hr 61 at this time. Cardiology made aware.

## 2024-06-27 NOTE — Consult Note (Signed)
 Initial Consultation Note   Patient: Jesse Mcclure. FMW:[id number] DOB: [birth year] PCP: [name], MD DOA: 06/27/2024 DOS: the patient was seen and examined on 06/27/2024 Primary service: Josefina Chew, MD  Referring physician: Josefina Chew, MD Reason for consult: New atrial flutter  HPI/Course: Jesse JINNY Percy Mickey. is a 73 y.o. adult with medical history significant of hypertension, anemia, obesity, essential tremor, multiple myeloma, PE, back pain, chronic diastolic CHF who presented for right total knee arthroplasty and was found to have incidental new onset atrial flutter.  Presented for right knee total arthroplasty in the setting of right sided osteoarthritis with progressive pain and functional disability having failed conservative treatment.  Noted to have new onset atrial flutter perioperatively.  This was rate controlled and was discussed with cardiology.  Cardiology put a brief note indicating they will see the patient tomorrow and recommend increasing his anticoagulation to full dose when cleared by surgery.  Also recommend echocardiogram.  No lab work performed thus far this admission.  Patient is drowsy postop but denies fevers, chills, chest pain, shortness of breath, abdominal pain, constipation, diarrhea, nausea, vomiting.  Review of Systems: As per HPI otherwise all other systems reviewed and are negative.  Past Medical History:  Diagnosis Date   Arthritis    Blood dyscrasia    hemachromatosis   Bone loss of mandible    BPH (benign prostatic hypertrophy)    CHF (congestive heart failure) (HCC)    Degenerative disc disease    Diverticulosis    Hemochromatosis    HTN (hypertension)    Hyperlipidemia    Multiple myeloma (HCC)    Neuromuscular disorder (HCC)    Occasional tremors    takes Primadone   Polio    as a child   Pulmonary embolism (HCC) 07/2021   after  Left TKA   Status post Nissen fundoplication (without gastrostomy tube) procedure     for hiatal hernia.    Past Surgical History:  Procedure Laterality Date   EYE SURGERY Bilateral    cataracts   FOOT SURGERY     HIATAL HERNIA REPAIR     OTHER SURGICAL HISTORY     Cataract Surgery--Both eyes   SKIN BIOPSY Right 04/13/2022   squamous cell carcinoma in stiu arising in actinic keratosis   TOTAL KNEE ARTHROPLASTY Left 07/15/2021   Procedure: TOTAL KNEE ARTHROPLASTY;  Surgeon: Josefina Chew, MD;  Location: [facility];  Service: Orthopedics;  Laterality: Left;   Undescended testes Right 10/05/1966    Social History  reports that he has never smoked. He has never used smokeless tobacco. He reports that he does not currently use alcohol  after a past usage of about 4.0 standard drinks of alcohol  per week. He reports current drug use. Drug: Oxycodone .  No Known Allergies  Family History  Problem Relation Age of Onset   Breast cancer Mother    Arrhythmia Father    Heart failure Father    Breast cancer Sister   Reviewed   Prior to Admission medications   Medication Sig Start Date End Date Taking? Authorizing Provider  acetaminophen  (TYLENOL ) 500 MG tablet Take 1,000 mg by mouth every 6 (six) hours as needed.   Yes [provider]  acyclovir  (ZOVIRAX ) 400 MG tablet Take 1 tablet (400 mg total) by mouth daily. 06/22/24  Yes Gorsuch, Ni, MD  atorvastatin  (LIPITOR) 20 MG tablet Take 1 tablet (20 mg total) by mouth daily. 05/09/24  Yes [name], MD  calcium  carbonate (TUMS - DOSED  IN MG ELEMENTAL CALCIUM ) 500 MG chewable tablet Chew 1 tablet by mouth 2 (two) times daily.   Yes [provider]  Cholecalciferol  (VITAMIN D ) 50 MCG (2000 UT) tablet Take 2,000 Units by mouth daily.   Yes [provider]  empagliflozin  (JARDIANCE ) 10 MG TABS tablet Take 1 tablet (10 mg total) by mouth daily. 05/09/24  Yes [name], MD  folic acid  (FOLVITE ) 400 MCG tablet Take 400 mcg by mouth daily.   Yes [provider]  furosemide  (LASIX ) 40 MG tablet Take  1 tablet (40 mg total) by mouth daily. 11/08/23  Yes Gorsuch, Ni, MD  ixazomib citrate  (NINLARO ) 3 MG capsule Take 1 capsule (3 mg) by mouth weekly, 3 weeks on, 1 week off, repeat every 4 weeks. Take on an empty stomach 1hr before or 2hr after meals. 10/15/23  Yes Gorsuch, Ni, MD  losartan  (COZAAR ) 100 MG tablet Take 1 tablet (100 mg total) by mouth daily. 05/09/24 05/09/25 Yes [name], MD  Multiple Vitamin (MULTI-VITAMIN) tablet Take 1 tablet by mouth daily. 08/30/19  Yes [provider]  oxyCODONE  (OXY IR/ROXICODONE ) 5 MG immediate release tablet Take 1 tablet (5 mg total) by mouth every 4 (four) hours as needed for severe pain (pain score 7-10). 06/19/24  Yes Gorsuch, Almarie, MD  Polyethyl Glycol-Propyl Glycol (SYSTANE OP) Place 1 drop into both eyes daily as needed (dry eyes).   Yes [provider]  pomalidomide  (POMALYST ) 3 MG capsule Take 1 capsule (3 mg total) by mouth daily for 21 days. [employer] Auth # [id number]     Date Obtained 06/08/2024 06/08/24 06/29/24 Yes Lonn Almarie, MD  primidone  (MYSOLINE ) 50 MG tablet Take 1 tablet (50 mg total) by mouth at bedtime. Patient taking differently: Take 50 mg by mouth at bedtime as needed (TREMORS). 08/06/23  Yes Vaslow, Zachary K, MD  prochlorperazine  (COMPAZINE ) 10 MG tablet TAKE 1 TABLET(10 MG) BY MOUTH EVERY 6 HOURS AS NEEDED FOR NAUSEA OR VOMITING 03/13/24  Yes Gorsuch, Ni, MD  rivaroxaban  (XARELTO ) 10 MG TABS tablet Take 1 tablet (10 mg total) by mouth daily with supper. 08/12/23  Yes Gorsuch, Ni, MD  vitamin B-12 (CYANOCOBALAMIN ) 1000 MCG tablet Take 1,000 mcg by mouth daily.   Yes [provider]    Physical Exam: Vitals:   06/27/24 1302 06/27/24 1303 06/27/24 1304 06/27/24 1305  BP:      Pulse: 76 75 78 75  Resp: 16 17 20 18   Temp:      TempSrc:      SpO2: 96% 95% 95% 95%  Weight:      Height:        Physical Exam Constitutional:      General: He is not in acute distress.    Appearance: Normal appearance.  HENT:      Head: Normocephalic and atraumatic.     Mouth/Throat:     Mouth: Mucous membranes are moist.     Pharynx: Oropharynx is clear.  Eyes:     Extraocular Movements: Extraocular movements intact.     Pupils: Pupils are equal, round, and reactive to light.  Cardiovascular:     Rate and Rhythm: Normal rate and regular rhythm.     Pulses: Normal pulses.     Heart sounds: Normal heart sounds.  Pulmonary:     Effort: Pulmonary effort is normal. No respiratory distress.     Breath sounds: Normal breath sounds.  Abdominal:     General: Bowel sounds are normal. There is no  distension.     Palpations: Abdomen is soft.     Tenderness: There is no abdominal tenderness.  Musculoskeletal:        General: No swelling or deformity.  Skin:    General: Skin is warm and dry.  Neurological:     General: No focal deficit present.     Mental Status: Mental status is at baseline.     Labs on Admission: I have personally reviewed following labs and imaging studies  CBC: No results for input(s): WBC, NEUTROABS, HGB, HCT, MCV, PLT in the last 168 hours.  Basic Metabolic Panel: No results for input(s): NA, K, CL, CO2, GLUCOSE, BUN, CREATININE, CALCIUM , MG, PHOS in the last 168 hours.  GFR: Estimated Creatinine Clearance (by C-G formula based on SCr of 0.85 mg/dL) Male: 22.5 mL/min Male: 94.1 mL/min  Liver Function Tests: No results for input(s): AST, ALT, ALKPHOS, BILITOT, PROT, ALBUMIN in the last 168 hours.  Urine analysis:    Component Value Date/Time   BILIRUBINUR n 05/20/2016 1101   PROTEINUR n 05/20/2016 1101   UROBILINOGEN negative 05/20/2016 1101   NITRITE n 05/20/2016 1101   LEUKOCYTESUR Negative 05/20/2016 1101    Radiological Exams on Admission: No results found.  EKG: Independently reviewed.  Atrial flutter at 62 bpm.  Assessment/Plan Principal Problem:   Atrial flutter (HCC) Active Problems:   Essential hypertension    Obesity (BMI 30-39.9)   Multiple myeloma without remission (HCC)   S/P bone marrow transplant (HCC)   Peripheral neuropathy due to chemotherapy   Deficiency anemia   History of pulmonary embolus (PE)   Essential tremor   (HFpEF) heart failure with preserved ejection fraction (HCC)   Chronic midline low back pain without sciatica    Atrial flutter > Patient noted to have new onset atrial flutter perioperatively.  EKG shows atrial flutter at 62 bpm. > Discussed with cardiology preop and he was cleared to proceed with plan for cardiology consultation tomorrow, 9/24.  Initial recommendation was for echocardiogram and full dose anticoagulation when cleared by surgery. - Monitor on telemetry - Appreciate cardiology recommendations and assistance - Echocardiogram - Full dose anticoagulation when cleared - Check TSH  OA Status post right TKA > Presented today for right TKA.  Performed without significant complication. - Management per primary team - Has as needed pain medication prescribed  Chronic pain - Currently prescribed as needed pain medicine  Hypertension - Continue Lasix , losartan   Anemia - CBC  Obesity - Noted  Tremors - Continue home as needed primidone   Multiple myeloma > Status post bone marrow transplant.  Currently on Ninlaro  and Pomalyst .  History of PE - On lower dose Xarelto , to be increased to full dose in the setting of atrial flutter as above  DVT prophylaxis: SCDs for now, full dose Xarelto  when cleared Code Status:   Full  TRH will continue to follow the patient.  Family Communication: None on 9/23  Thank you very much for involving us  in the care of your patient.  Author: Marsa KATHEE Scurry, MD 06/27/2024 5:17 PM  For on call review [email or web address].

## 2024-06-27 NOTE — Anesthesia Procedure Notes (Addendum)
 Anesthesia Regional Block: Adductor canal block   Pre-Anesthetic Checklist: , timeout performed,  Correct Patient, Correct Site, Correct Laterality,  Correct Procedure,, site marked,  Risks and benefits discussed,  Surgical consent,  Pre-op evaluation,  At surgeon's request and post-op pain management  Laterality: Right  Prep: chloraprep       Needles:  Injection technique: Single-shot  Needle Type: Echogenic Stimulator Needle     Needle Length: 10cm  Needle Gauge: 20     Additional Needles:   Procedures:,,,, ultrasound used (permanent image in chart),,    Narrative:  Start time: 06/27/2024 12:50 PM End time: 06/27/2024 1:00 PM Injection made incrementally with aspirations every 5 mL.  Performed by: Personally  Anesthesiologist: Patrisha Bernardino SQUIBB, MD  Additional Notes: Functioning IV was confirmed and monitors were applied. A time-out was performed. Hand hygiene and sterile gloves were used. The thigh was placed in a frog-leg position and prepped in a sterile fashion. A 20ga Bbraun echogenic stimulator needle was placed using ultrasound guidance.  Negative aspiration and negative test dose prior to incremental administration of local anesthetic. The patient tolerated the procedure well.

## 2024-06-27 NOTE — Discharge Instructions (Signed)

## 2024-06-27 NOTE — Anesthesia Procedure Notes (Signed)
 Procedure Name: LMA Insertion Date/Time: 06/27/2024 3:01 PM  Performed by: Therisa Doyal CROME, CRNAPatient Re-evaluated:Patient Re-evaluated prior to induction Oxygen Delivery Method: Circle system utilized Preoxygenation: Pre-oxygenation with 100% oxygen Induction Type: IV induction LMA: LMA inserted LMA Size: 5.0 Number of attempts: 1 Placement Confirmation: positive ETCO2 and breath sounds checked- equal and bilateral Tube secured with: Tape Dental Injury: Teeth and Oropharynx as per pre-operative assessment

## 2024-06-28 ENCOUNTER — Other Ambulatory Visit: Payer: Self-pay | Admitting: Physician Assistant

## 2024-06-28 ENCOUNTER — Other Ambulatory Visit

## 2024-06-28 ENCOUNTER — Encounter (HOSPITAL_COMMUNITY): Payer: Self-pay | Admitting: Orthopedic Surgery

## 2024-06-28 ENCOUNTER — Observation Stay (HOSPITAL_COMMUNITY)

## 2024-06-28 DIAGNOSIS — D539 Nutritional anemia, unspecified: Secondary | ICD-10-CM | POA: Diagnosis present

## 2024-06-28 DIAGNOSIS — E785 Hyperlipidemia, unspecified: Secondary | ICD-10-CM | POA: Diagnosis present

## 2024-06-28 DIAGNOSIS — I441 Atrioventricular block, second degree: Secondary | ICD-10-CM

## 2024-06-28 DIAGNOSIS — I272 Pulmonary hypertension, unspecified: Secondary | ICD-10-CM | POA: Diagnosis present

## 2024-06-28 DIAGNOSIS — G250 Essential tremor: Secondary | ICD-10-CM | POA: Diagnosis present

## 2024-06-28 DIAGNOSIS — I480 Paroxysmal atrial fibrillation: Secondary | ICD-10-CM | POA: Diagnosis present

## 2024-06-28 DIAGNOSIS — Z9481 Bone marrow transplant status: Secondary | ICD-10-CM | POA: Diagnosis not present

## 2024-06-28 DIAGNOSIS — G8929 Other chronic pain: Secondary | ICD-10-CM | POA: Diagnosis present

## 2024-06-28 DIAGNOSIS — I5032 Chronic diastolic (congestive) heart failure: Secondary | ICD-10-CM | POA: Diagnosis present

## 2024-06-28 DIAGNOSIS — C9000 Multiple myeloma not having achieved remission: Secondary | ICD-10-CM | POA: Diagnosis present

## 2024-06-28 DIAGNOSIS — I3139 Other pericardial effusion (noninflammatory): Secondary | ICD-10-CM | POA: Diagnosis present

## 2024-06-28 DIAGNOSIS — I440 Atrioventricular block, first degree: Secondary | ICD-10-CM

## 2024-06-28 DIAGNOSIS — I10 Essential (primary) hypertension: Secondary | ICD-10-CM | POA: Diagnosis not present

## 2024-06-28 DIAGNOSIS — M25761 Osteophyte, right knee: Secondary | ICD-10-CM | POA: Diagnosis present

## 2024-06-28 DIAGNOSIS — G620 Drug-induced polyneuropathy: Secondary | ICD-10-CM | POA: Diagnosis present

## 2024-06-28 DIAGNOSIS — I4892 Unspecified atrial flutter: Secondary | ICD-10-CM | POA: Diagnosis present

## 2024-06-28 DIAGNOSIS — M8588 Other specified disorders of bone density and structure, other site: Secondary | ICD-10-CM | POA: Diagnosis present

## 2024-06-28 DIAGNOSIS — M549 Dorsalgia, unspecified: Secondary | ICD-10-CM | POA: Diagnosis present

## 2024-06-28 DIAGNOSIS — D630 Anemia in neoplastic disease: Secondary | ICD-10-CM | POA: Diagnosis present

## 2024-06-28 DIAGNOSIS — T451X5A Adverse effect of antineoplastic and immunosuppressive drugs, initial encounter: Secondary | ICD-10-CM | POA: Diagnosis present

## 2024-06-28 DIAGNOSIS — E669 Obesity, unspecified: Secondary | ICD-10-CM | POA: Diagnosis present

## 2024-06-28 DIAGNOSIS — M545 Low back pain, unspecified: Secondary | ICD-10-CM | POA: Diagnosis present

## 2024-06-28 DIAGNOSIS — M1711 Unilateral primary osteoarthritis, right knee: Secondary | ICD-10-CM | POA: Diagnosis present

## 2024-06-28 DIAGNOSIS — I110 Hypertensive heart disease with heart failure: Secondary | ICD-10-CM | POA: Diagnosis present

## 2024-06-28 DIAGNOSIS — N400 Enlarged prostate without lower urinary tract symptoms: Secondary | ICD-10-CM | POA: Diagnosis present

## 2024-06-28 LAB — ECHOCARDIOGRAM COMPLETE
Area-P 1/2: 3.44 cm2
Calc EF: 74.2 %
Height: 69 in
S' Lateral: 2.6 cm
Single Plane A2C EF: 74.8 %
Single Plane A4C EF: 74.6 %
Weight: 3728 [oz_av]

## 2024-06-28 LAB — CBC
HCT: 45 % (ref 39.0–52.0)
Hemoglobin: 14.1 g/dL (ref 13.0–17.0)
MCH: 33.7 pg (ref 26.0–34.0)
MCHC: 31.3 g/dL (ref 30.0–36.0)
MCV: 107.4 fL — ABNORMAL HIGH (ref 80.0–100.0)
Platelets: 188 K/uL (ref 150–400)
RBC: 4.19 MIL/uL — ABNORMAL LOW (ref 4.22–5.81)
RDW: 15.1 % (ref 11.5–15.5)
WBC: 6.8 K/uL (ref 4.0–10.5)
nRBC: 0 % (ref 0.0–0.2)

## 2024-06-28 LAB — BASIC METABOLIC PANEL WITH GFR
Anion gap: 13 (ref 5–15)
BUN: 18 mg/dL (ref 8–23)
CO2: 23 mmol/L (ref 22–32)
Calcium: 8.8 mg/dL — ABNORMAL LOW (ref 8.9–10.3)
Chloride: 103 mmol/L (ref 98–111)
Creatinine, Ser: 0.83 mg/dL (ref 0.61–1.24)
GFR, Estimated: >60 mL/min (ref >60)
Glucose, Bld: 143 mg/dL — ABNORMAL HIGH (ref 70–99)
Potassium: 4.6 mmol/L (ref 3.5–5.1)
Sodium: 139 mmol/L (ref 135–145)

## 2024-06-28 LAB — MAGNESIUM: Magnesium: 2.2 mg/dL (ref 1.7–2.4)

## 2024-06-28 MED ORDER — RIVAROXABAN 10 MG PO TABS
10.0000 mg | ORAL_TABLET | Freq: Every day | ORAL | Status: AC
  Administered 2024-06-28: 10 mg via ORAL
  Filled 2024-06-28: qty 1

## 2024-06-28 MED ORDER — PERFLUTREN LIPID MICROSPHERE
1.0000 mL | INTRAVENOUS | Status: AC | PRN
  Administered 2024-06-28: 2 mL via INTRAVENOUS

## 2024-06-28 MED ORDER — RIVAROXABAN 10 MG PO TABS
20.0000 mg | ORAL_TABLET | Freq: Every day | ORAL | Status: DC
  Administered 2024-06-29: 20 mg via ORAL
  Filled 2024-06-28: qty 2

## 2024-06-28 NOTE — Plan of Care (Addendum)
  Problem: Pain Management: Goal: Pain level will decrease with appropriate interventions Outcome: Progressing   Problem: Clinical Measurements: Goal: Postoperative complications will be avoided or minimized Outcome: Progressing   Problem: Activity: Goal: Range of joint motion will improve Outcome: Progressing   Problem: Safety: Goal: Ability to remain free from injury will improve Outcome: Progressing   Problem: Elimination: Goal: Will not experience complications related to urinary retention Outcome: Progressing

## 2024-06-28 NOTE — Consult Note (Signed)
 Cardiology Consultation   Patient ID: Jesse Mcclure. MRN: [id number]; DOB: [birth year]  Admit date: 06/27/2024 Date of Consult: 06/28/2024  PCP:  [name], MD   [facility] Providers Cardiologist:  Alvan Ronal BRAVO, MD (Inactive)  Cardiology APP:  Lelon Glendia DASEN, PA-C       Patient Profile: Jesse Mcclure. is a 73 y.o. adult with a hx of hypertension,HFpEF, 1st Degree AV block, obesity, essential tremor, multiple myeloma, hemochromatosis, hx of PE on chronic anticoagulation, and back pain who is being seen 06/28/2024 for the evaluation of atrial flutter at the request of Fonda Olmsted.  History of Present Illness: Mr. Siemen follows with [facility], last seen 04/26/24 for pre-operative clearance for total knee replacement. At that time patient was doing overall well, he did note DOE though with long distances.  Patient has declined CPAP therapy previously.  Most recent echocardiogram showed LVEF 60-65% with mild concentric LVH. Severely dilated LA.   Patient had presented 9/23 for a total knee replacement when he was incidentally found to be in AFL, rate controlled. Cardiology was consulted and recommended to increase his xarelto  dose when he can restart medication per surgery and that we would see patient in consultation after his surgery. Surgery went well without complication per OP note. ECG: variable AFL VR 60 Lab work: BMP mostly unremarkable, TSH normal  Unclear when patient converted out of AFL, but arrival to current bed patient was in accelerated junctional vs sinus with 1st AV block.  On interview, patient was completely asymptomatic. He has also been doing well from a HF stand point. Weight stable.  Denied lightheadedness/dizziness, chest pain, palpitations, shortness of breath, orthopnea, PND, abdominal distention, and peripheral edema  Past Medical History:  Diagnosis Date   Arthritis    Blood dyscrasia    hemachromatosis   Bone loss of  mandible    BPH (benign prostatic hypertrophy)    CHF (congestive heart failure) (HCC)    Degenerative disc disease    Diverticulosis    Hemochromatosis    HTN (hypertension)    Hyperlipidemia    Multiple myeloma (HCC)    Neuromuscular disorder (HCC)    Occasional tremors    takes Primadone   Polio    as a child   Pulmonary embolism (HCC) 07/2021   after  Left TKA   Status post Nissen fundoplication (without gastrostomy tube) procedure    for hiatal hernia.    Past Surgical History:  Procedure Laterality Date   EYE SURGERY Bilateral    cataracts   FOOT SURGERY     HIATAL HERNIA REPAIR     OTHER SURGICAL HISTORY     Cataract Surgery--Both eyes   SKIN BIOPSY Right 04/13/2022   squamous cell carcinoma in stiu arising in actinic keratosis   TOTAL KNEE ARTHROPLASTY Left 07/15/2021   Procedure: TOTAL KNEE ARTHROPLASTY;  Surgeon: Olmsted Fonda, MD;  Location: [facility];  Service: Orthopedics;  Laterality: Left;   Undescended testes Right 10/05/1966       Scheduled Meds:  acetaminophen   1,000 mg Oral Q6H   acyclovir   400 mg Oral Daily   atorvastatin   20 mg Oral Daily   calcium  carbonate  1 tablet Oral BID   cholecalciferol   2,000 Units Oral Daily   docusate sodium   100 mg Oral BID   empagliflozin   10 mg Oral Daily   furosemide   40 mg Oral Daily   losartan   100 mg Oral Daily   [START ON 06/29/2024]  rivaroxaban   20 mg Oral Q supper   Continuous Infusions:  PRN Meds: acetaminophen , alum & mag hydroxide-simeth, artificial tears, bisacodyl , diphenhydrAMINE , HYDROmorphone  (DILAUDID ) injection, magnesium  citrate, menthol  **OR** phenol, methocarbamol  **OR** methocarbamol  (ROBAXIN ) injection, metoCLOPramide  **OR** metoCLOPramide  (REGLAN ) injection, ondansetron  **OR** ondansetron  (ZOFRAN ) IV, oxyCODONE , oxyCODONE , polyethylene glycol, primidone , zolpidem   Allergies:   No Known Allergies  Social History:   Social History   Socioeconomic History   Marital status: Single     Spouse name: Not on file   Number of children: Not on file   Years of education: Not on file   Highest education level: 12th grade  Occupational History   Not on file  Tobacco Use   Smoking status: Never   Smokeless tobacco: Never  Vaping Use   Vaping status: Never Used  Substance and Sexual Activity   Alcohol  use: Not Currently    Alcohol /week: 4.0 standard drinks of alcohol     Types: 4 drink(s) per week    Comment: occasionally   Drug use: Yes    Types: Oxycodone    Sexual activity: Yes  Other Topics Concern   Not on file  Social History Narrative   Not on file   Social Drivers of Health   Financial Resource Strain: Low Risk  (12/31/2023)   Overall Financial Resource Strain (CARDIA)    Difficulty of Paying Living Expenses: Not hard at all  Food Insecurity: No Food Insecurity (06/27/2024)   Hunger Vital Sign    Worried About Running Out of Food in the Last Year: Never true    Ran Out of Food in the Last Year: Never true  Transportation Needs: No Transportation Needs (06/27/2024)   PRAPARE - Administrator, Civil Service (Medical): No    Lack of Transportation (Non-Medical): No  Physical Activity: Insufficiently Active (12/31/2023)   Exercise Vital Sign    Days of Exercise per Week: 2 days    Minutes of Exercise per Session: 30 min  Stress: No Stress Concern Present (12/31/2023)   [employer] of Occupational Health - Occupational Stress Questionnaire    Feeling of Stress : Only a little  Social Connections: Socially Integrated (06/27/2024)   Social Connection and Isolation Panel    Frequency of Communication with Friends and Family: Twice a week    Frequency of Social Gatherings with Friends and Family: Once a week    Attends Religious Services: 1 to 4 times per year    Active Member of [employer] or Organizations: Yes    Attends Banker Meetings: Never    Marital Status: Living with partner  Intimate Partner Violence: Not At Risk (06/27/2024)    Humiliation, Afraid, Rape, and Kick questionnaire    Fear of Current or Ex-Partner: No    Emotionally Abused: No    Physically Abused: No    Sexually Abused: No    Family History:   Family History  Problem Relation Age of Onset   Breast cancer Mother    Arrhythmia Father    Heart failure Father    Breast cancer Sister      ROS:  Please see the history of present illness.  All other ROS reviewed and negative.     Physical Exam/Data: Vitals:   06/27/24 2114 06/27/24 2308 06/28/24 0531 06/28/24 0951  BP: (!) 152/96 (!) 143/97 (!) 145/99 (!) 136/90  Pulse: 86 79 74 80  Resp: 16 18 18 16   Temp: 97.6 F (36.4 C) 97.8 F (36.6 C) 97.8 F (36.6 C) 97.9 F (  36.6 C)  TempSrc:  Oral Oral Oral  SpO2: 97% 98% 98% 94%  Weight:      Height:        Intake/Output Summary (Last 24 hours) at 06/28/2024 1205 Last data filed at 06/28/2024 9047 Gross per 24 hour  Intake 2054.13 ml  Output 1275 ml  Net 779.13 ml      06/27/2024   10:39 AM 06/15/2024   11:21 AM 05/09/2024    9:54 AM  Last 3 Weights  Weight (lbs) 233 lb 233 lb 232 lb 9.6 oz  Weight (kg) 105.688 kg 105.688 kg 105.507 kg     Body mass index is 34.41 kg/m.  General:  Well nourished, well developed, in no acute distress HEENT: normal Cardiac:  normal S1, S2; RRR; no murmur  Lungs:  clear to auscultation bilaterally, no wheezing, rhonchi or rales  Abd: soft, nontender, no hepatomegaly  Ext: no edema, surgical incision to right knee with clean dressing Skin: warm and dry  Neuro:  CNs 2-12 intact, no focal abnormalities noted Psych:  Normal affect   EKG:  The EKG was personally reviewed and demonstrates:  see hpi Telemetry:  Telemetry was personally reviewed and demonstrates:  telemetry does not show actual conversion, first rhythm noted to be junctional vs 1st AV block [ possible p-wave buried in t wave]. Paroxysmal AF also noted. Avg HR 75-80  Relevant CV Studies: Echocardiogram 2024 IMPRESSIONS     1. Left  ventricular ejection fraction, by estimation, is 60 to 65%. The  left ventricle has normal function. The left ventricle has no regional  wall motion abnormalities. There is mild concentric left ventricular  hypertrophy. Left ventricular diastolic  parameters are indeterminate.   2. Right ventricular systolic function is normal. The right ventricular  size is normal. Tricuspid regurgitation signal is inadequate for assessing  PA pressure.   3. Left atrial size was severely dilated.   4. The mitral valve is normal in structure. No evidence of mitral valve  regurgitation. No evidence of mitral stenosis.   5. The aortic valve is normal in structure. Aortic valve regurgitation is  not visualized. No aortic stenosis is present.   6. The inferior vena cava is normal in size with greater than 50%  respiratory variability, suggesting right atrial pressure of 3 mmHg.    Laboratory Data:  Chemistry Recent Labs  Lab 06/27/24 1849 06/28/24 0345  NA 137 139  K 4.1 4.6  CL 103 103  CO2 20* 23  GLUCOSE 163* 143*  BUN 15 18  CREATININE 0.83 0.83  CALCIUM  8.9 8.8*  GFRNONAA >60 >60  ANIONGAP 14 13   Hematology Recent Labs  Lab 06/27/24 1849 06/28/24 0345  WBC 5.5 6.8  RBC 4.60 4.19*  HGB 15.9 14.1  HCT 48.6 45.0  MCV 105.7* 107.4*  MCH 34.6* 33.7  MCHC 32.7 31.3  RDW 15.0 15.1  PLT 200 188   Thyroid   Recent Labs  Lab 06/27/24 1849  TSH 1.780   Radiology/Studies:  DG Knee Right Port Result Date: 06/27/2024 CLINICAL DATA:  061379 S/P total knee replacement, right [id number] EXAM: PORTABLE RIGHT KNEE - 1-2 VIEW COMPARISON:  05/18/2023 FINDINGS: Well-aligned knee arthroplasty without acute fracture.No dislocation. Subcutaneous edema and gas present, an expected finding at this stage in the postoperative recovery. No radiopaque foreign body or retained surgical instrument. IMPRESSION: Well-aligned knee arthroplasty without acute fracture or dislocation.No radiopaque foreign body or  retained surgical instrument. Electronically Signed   By: Rogelia Carlean HERO.D.  On: 06/27/2024 18:04    Assessment and Plan: Atrial Flutter- rate controlled Hx of 1st Degree AV Block Hx of PE on chronic anticoagulation Incidentally noted to be in AFL- rate controlled. Patient was asymptomatic. He is currently converted, unclear when this occurred during admission, most likely during surgery with anesthesia  It is hard to tell on telemetry current rhythm,  waiting for pending 12 lead Echo pending Mag pending [place] Vasc score 3  Will increase chronic xarelto  dose to cover for AF/AFL start xarelto  20 mg when okay by surgery. It appears he was given 10 mg today with 20 mg scheduled for tomorrow.  Would avoid AV nodal blocking agent with known 1st degree AV block Will discuss heart monitor at discharge to assess burden.  Chronic HFpEF Asymptomatic No evidence of volume overload, weight has remained stable.   Continue jardiance  10 mg Continue cozaar  100 mg Continue lasix  40 mg  Hypertension Continue cozaar  100 mg Continue lasix  40 mg  Hyperlipidemia Continue lipitor 20 mg  Per primary Recent knee surgery Multiple myeloma with anemia Chronic pain Tremors Obesity   Risk Assessment/Risk Scores:       New [place]  Heart Association (NYHA) Functional Class NYHA Class II  CHA2DS2-VASc Score = 3   This indicates a 3.2% annual risk of stroke. The patient's score is based upon: CHF History: 1 HTN History: 1 Diabetes History: 0 Stroke History: 0 Vascular Disease History: 0 Age Score: 1 Gender Score: 0        For questions or updates, please contact [facility] Please consult [email or web address] for contact info under      Signed, Leontine LOISE Salen, PA-C  06/28/2024 12:05 PM

## 2024-06-28 NOTE — Progress Notes (Unsigned)
 Enrolled patient for a 14 day Zio XT monitor to be mailed to patients home  [place] to read

## 2024-06-28 NOTE — TOC Transition Note (Signed)
 Transition of Care [facility]) - Discharge Note   Patient Details  Name: Jesse Mcclure. MRN: [id number] Date of Birth: 18-Jul-1951  Transition of Care [facility]) CM/SW Contact:  Alfonse JONELLE Rex, RN Phone Number: 06/28/2024, 10:10 AM   Clinical Narrative:   Met with patient at bedside to review dc therapy and home equipment needs, pt confirmed OPPT [address] has a RW at home. MOON completed. No TOC needs     Final next level of care: OP Rehab Barriers to Discharge: No Barriers Identified   Patient Goals and CMS Choice Patient states their goals for this hospitalization and ongoing recovery are:: return home          Discharge Placement                       Discharge Plan and Services Additional resources added to the After Visit Summary for                                       Social Drivers of Health (SDOH) Interventions SDOH Screenings   Food Insecurity: No Food Insecurity (06/27/2024)  Housing: Low Risk  (06/27/2024)  Transportation Needs: No Transportation Needs (06/27/2024)  Utilities: Not At Risk (06/27/2024)  Alcohol  Screen: Low Risk  (12/31/2023)  Depression (PHQ2-9): Low Risk  (01/04/2024)  Financial Resource Strain: Low Risk  (12/31/2023)  Physical Activity: Insufficiently Active (12/31/2023)  Social Connections: Socially Integrated (06/27/2024)  Stress: No Stress Concern Present (12/31/2023)  Tobacco Use: Low Risk  (06/27/2024)  Health Literacy: Adequate Health Literacy (01/04/2024)     Readmission Risk Interventions     No data to display

## 2024-06-28 NOTE — Evaluation (Signed)
 Physical Therapy Evaluation Patient Details Name: Jesse Mcclure. MRN: [id number] DOB: October 15, 1950 Today's Date: 06/28/2024  History of Present Illness  73 yo male s/p R TKA on 06/28/24, post op  incidental new onset atrial flutter. EFY:[id number], anemia, obesity, essential tremor, multiple myeloma, PE, back pain, chronic diastolic CHF, L TKA 07/15/24  Clinical Impression  Pt is s/p TKA resulting in the deficits listed below (see PT Problem List).  Pt motivated to work with PT, amb ~ 30' with RW and min assist. HR 94-105.   Pt will benefit from acute skilled PT to increase their independence and safety with mobility to allow discharge.          If plan is discharge home, recommend the following: A little help with walking and/or transfers;Assist for transportation;Help with stairs or ramp for entrance   Can travel by private vehicle        Equipment Recommendations None recommended by PT  Recommendations for Other Services       Functional Status Assessment Patient has had a recent decline in their functional status and demonstrates the ability to make significant improvements in function in a reasonable and predictable amount of time.     Precautions / Restrictions Precautions Precautions: Fall;Knee Restrictions RLE Weight Bearing Per Provider Order: Weight bearing as tolerated      Mobility  Bed Mobility Overal bed mobility: Needs Assistance Bed Mobility: Supine to Sit     Supine to sit: Contact guard     General bed mobility comments: for safety, incr time    Transfers Overall transfer level: Needs assistance Equipment used: Rolling walker (2 wheels) Transfers: Sit to/from Stand Sit to Stand: Min assist           General transfer comment: cues for hand placement    Ambulation/Gait Ambulation/Gait assistance: Min assist Gait Distance (Feet): 30 Feet Assistive device: Rolling walker (2 wheels) Gait Pattern/deviations: Step-to pattern        General Gait Details: cues for sequence and RW position. assist to steady  Stairs            Wheelchair Mobility     Tilt Bed    Modified Rankin (Stroke Patients Only)       Balance Overall balance assessment: Needs assistance Sitting-balance support: No upper extremity supported, Feet supported Sitting balance-Leahy Scale: Fair     Standing balance support: During functional activity, Reliant on assistive device for balance, Bilateral upper extremity supported Standing balance-Leahy Scale: Poor                               Pertinent Vitals/Pain Pain Assessment Pain Assessment: 0-10 Pain Score: 5  Pain Location: left knee Pain Descriptors / Indicators: Sore Pain Intervention(s): Limited activity within patient's tolerance, Monitored during session, Premedicated before session    Home Living Family/patient expects to be discharged to:: Private residence Living Arrangements: Spouse/significant other Available Help at Discharge: Family Type of Home: Other(Comment) (townhome) Home Access: Stairs to enter Entrance Stairs-Rails: Left Entrance Stairs-Number of Steps: 2 (4 steps front with bil rails)   Home Layout: Two level;Able to live on main level with bedroom/bathroom Home Equipment: Rolling Walker (2 wheels);Rollator (4 wheels)      Prior Function Prior Level of Function : Independent/Modified Independent                     Extremity/Trunk Assessment   Upper Extremity Assessment Upper Extremity Assessment: Overall  WFL for tasks assessed    Lower Extremity Assessment Lower Extremity Assessment: RLE deficits/detail RLE Deficits / Details: ankle WFL, knee extension and hip flexion 2+/5, limited by anticipated post op pain and weakness       Communication   Communication Communication: No apparent difficulties    Cognition Arousal: Alert Behavior During Therapy: WFL for tasks assessed/performed   PT - Cognitive impairments:  No apparent impairments                         Following commands: Intact       Cueing Cueing Techniques: Verbal cues     General Comments      Exercises Total Joint Exercises Ankle Circles/Pumps: AROM, Both, 10 reps Quad Sets: AROM, Both, 5 reps   Assessment/Plan    PT Assessment Patient needs continued PT services  PT Problem List Decreased strength;Decreased balance;Decreased range of motion;Decreased activity tolerance;Decreased mobility;Decreased knowledge of use of DME       PT Treatment Interventions DME instruction;Therapeutic activities;Gait training;Functional mobility training;Therapeutic exercise;Patient/family education;Stair training    PT Goals (Current goals can be found in the Care Plan section)  Acute Rehab PT Goals Patient Stated Goal: use rollator PT Goal Formulation: With patient Time For Goal Achievement: 07/05/24 Potential to Achieve Goals: Good    Frequency 7X/week     Co-evaluation               AM-PAC PT 6 Clicks Mobility  Outcome Measure Help needed turning from your back to your side while in a flat bed without using bedrails?: A Little Help needed moving from lying on your back to sitting on the side of a flat bed without using bedrails?: A Little Help needed moving to and from a bed to a chair (including a wheelchair)?: A Little Help needed standing up from a chair using your arms (e.g., wheelchair or bedside chair)?: A Little Help needed to walk in hospital room?: A Little Help needed climbing 3-5 steps with a railing? : A Lot 6 Click Score: 17    End of Session Equipment Utilized During Treatment: Gait belt Activity Tolerance: Patient tolerated treatment well Patient left: in chair;with call bell/phone within reach;with chair alarm set Nurse Communication: Mobility status PT Visit Diagnosis: Other abnormalities of gait and mobility (R26.89);Unsteadiness on feet (R26.81)    Time: 8944-8880 PT Time Calculation  (min) (ACUTE ONLY): 24 min   Charges:   PT Evaluation $PT Eval Low Complexity: 1 Low PT Treatments $Gait Training: 8-22 mins PT General Charges $$ ACUTE PT VISIT: 1 Visit         Ayliana Casciano, PT  Acute Rehab Dept [facility]) [phone]  06/28/2024   [facility] 06/28/2024, 11:28 AM

## 2024-06-28 NOTE — Care Management Obs Status (Signed)
 MEDICARE OBSERVATION STATUS NOTIFICATION   Patient Details  Name: Jesse Mcclure. MRN: [id number] Date of Birth: 1951-09-02   Medicare Observation Status Notification Given:       Alfonse JONELLE Rex, RN 06/28/2024, 9:55 AM

## 2024-06-28 NOTE — Progress Notes (Signed)
 Subjective: 1 Day Post-Op s/p Procedure(s): ARTHROPLASTY, KNEE, TOTAL   Patient is alert, oriented. Reports pain as so far well controlled.  Voiding on own  Denies chest pain, SOB, Calf pain. No nausea/vomiting.  No other complaints.    Objective:  PE: VITALS:   Vitals:   06/27/24 2030 06/27/24 2114 06/27/24 2308 06/28/24 0531  BP: (!) 121/97 (!) 152/96 (!) 143/97 (!) 145/99  Pulse: 93 86 79 74  Resp: 15 16 18 18   Temp: 98.1 F (36.7 C) 97.6 F (36.4 C) 97.8 F (36.6 C) 97.8 F (36.6 C)  TempSrc:   Oral Oral  SpO2: 96% 97% 98% 98%  Weight:      Height:       Sitting up in bed, in no acute distress Normal respiratory effort Sensation intact distally Intact pulses distally Dorsiflexion/Plantar flexion intact Incision: dressing C/D/I  LABS  Results for orders placed or performed during the hospital encounter of 06/27/24 (from the past 24 hours)  Glucose, capillary     Status: Abnormal   Collection Time: 06/27/24 10:41 AM  Result Value Ref Range   Glucose-Capillary 216 (H) 70 - 99 mg/dL   Comment 1 Notify RN    Comment 2 Document in Chart   Glucose, capillary     Status: None   Collection Time: 06/27/24  1:29 PM  Result Value Ref Range   Glucose-Capillary 78 70 - 99 mg/dL  Glucose, capillary     Status: Abnormal   Collection Time: 06/27/24  5:53 PM  Result Value Ref Range   Glucose-Capillary 132 (H) 70 - 99 mg/dL   Comment 1 Notify RN    Comment 2 Document in Chart   Basic metabolic panel     Status: Abnormal   Collection Time: 06/27/24  6:49 PM  Result Value Ref Range   Sodium 137 135 - 145 mmol/L   Potassium 4.1 3.5 - 5.1 mmol/L   Chloride 103 98 - 111 mmol/L   CO2 20 (L) 22 - 32 mmol/L   Glucose, Bld 163 (H) 70 - 99 mg/dL   BUN 15 8 - 23 mg/dL   Creatinine, Ser 9.16 0.61 - 1.24 mg/dL   Calcium  8.9 8.9 - 10.3 mg/dL   GFR, Estimated >39 >39 mL/min   Anion gap 14 5 - 15  CBC     Status: Abnormal   Collection Time: 06/27/24  6:49 PM  Result Value  Ref Range   WBC 5.5 4.0 - 10.5 K/uL   RBC 4.60 4.22 - 5.81 MIL/uL   Hemoglobin 15.9 13.0 - 17.0 g/dL   HCT 51.3 60.9 - 47.9 %   MCV 105.7 (H) 80.0 - 100.0 fL   MCH 34.6 (H) 26.0 - 34.0 pg   MCHC 32.7 30.0 - 36.0 g/dL   RDW 84.9 88.4 - 84.4 %   Platelets 200 150 - 400 K/uL   nRBC 0.0 0.0 - 0.2 %  TSH     Status: None   Collection Time: 06/27/24  6:49 PM  Result Value Ref Range   TSH 1.780 0.350 - 4.500 uIU/mL  CBC     Status: Abnormal   Collection Time: 06/28/24  3:45 AM  Result Value Ref Range   WBC 6.8 4.0 - 10.5 K/uL   RBC 4.19 (L) 4.22 - 5.81 MIL/uL   Hemoglobin 14.1 13.0 - 17.0 g/dL   HCT 54.9 60.9 - 47.9 %   MCV 107.4 (H) 80.0 - 100.0 fL   MCH 33.7 26.0 -  34.0 pg   MCHC 31.3 30.0 - 36.0 g/dL   RDW 84.8 88.4 - 84.4 %   Platelets 188 150 - 400 K/uL   nRBC 0.0 0.0 - 0.2 %  Basic metabolic panel     Status: Abnormal   Collection Time: 06/28/24  3:45 AM  Result Value Ref Range   Sodium 139 135 - 145 mmol/L   Potassium 4.6 3.5 - 5.1 mmol/L   Chloride 103 98 - 111 mmol/L   CO2 23 22 - 32 mmol/L   Glucose, Bld 143 (H) 70 - 99 mg/dL   BUN 18 8 - 23 mg/dL   Creatinine, Ser 9.16 0.61 - 1.24 mg/dL   Calcium  8.8 (L) 8.9 - 10.3 mg/dL   GFR, Estimated >39 >39 mL/min   Anion gap 13 5 - 15   *Note: Due to a large number of results and/or encounters for the requested time period, some results have not been displayed. A complete set of results can be found in Results Review.    DG Knee Right Port Result Date: 06/27/2024 CLINICAL DATA:  061379 S/P total knee replacement, right [id number] EXAM: PORTABLE RIGHT KNEE - 1-2 VIEW COMPARISON:  05/18/2023 FINDINGS: Well-aligned knee arthroplasty without acute fracture.No dislocation. Subcutaneous edema and gas present, an expected finding at this stage in the postoperative recovery. No radiopaque foreign body or retained surgical instrument. IMPRESSION: Well-aligned knee arthroplasty without acute fracture or dislocation.No radiopaque foreign body  or retained surgical instrument. Electronically Signed   By: Rogelia Myers M.D.   On: 06/27/2024 18:04    Today's  total administered Morphine  Milligram Equivalents: 30 Yesterday's total administered Morphine  Milligram Equivalents: 197.5  Assessment/Plan: Principal Problem:   Atrial flutter (HCC) Active Problems:   Essential hypertension   Obesity (BMI 30-39.9)   Multiple myeloma without remission (HCC)   S/P bone marrow transplant (HCC)   Peripheral neuropathy due to chemotherapy   Deficiency anemia   History of pulmonary embolus (PE)   Essential tremor   (HFpEF) heart failure with preserved ejection fraction (HCC)   Chronic midline low back pain without sciatica  Right knee osteoarthritis 1 Day Post-Op s/p Procedure(s): ARTHROPLASTY, KNEE, TOTAL  Weightbearing: WBAT RLE Insicional and dressing care: Reinforce dressings as needed VTE prophylaxis: Hbg stable this morning. Restarted Xarelto  10 mg this morning, will watch Hbg tomorrow with plan to increase up to Xarelto  20 mg tomorrow due to new onset atrial flutter Pain control: continue current regimen Follow - up plan: 2 weeks Dispo: pending PT/OT evals. Pending cardiology eval/plan regarding new atrial flutter. We appreciate input from both cardiology and medicine teams.    Contact information:   Army Daring, DEVONNA Tzzxijbd 8-5  After hours and holidays please check [email or web address] for group call information for [facility]  Army MARLA Daring 06/28/2024, 8:19 AM

## 2024-06-28 NOTE — Progress Notes (Signed)
 PROGRESS NOTE  Jesse Mcclure.  FMW:[id number] DOB: Nov 23, 1950 DOA: 06/27/2024 PCP: [name], MD   Brief Narrative: Patient is a 73 year old with history of hypertension, anemia, obesity, tremor, multiple myeloma, PE, back pain, chronic diastolic CHF who presented for elective right total knee arthroplasty.  Before the procedure, he was found to have A flutter with controlled ventricular response.  Patient was asymptomatic.  Cardiology consulted.    Assessment & Plan:  Principal Problem:   Atrial flutter (HCC) Active Problems:   Essential hypertension   Obesity (BMI 30-39.9)   Multiple myeloma without remission (HCC)   S/P bone marrow transplant (HCC)   Peripheral neuropathy due to chemotherapy   Deficiency anemia   History of pulmonary embolus (PE)   Essential tremor   (HFpEF) heart failure with preserved ejection fraction (HCC)   Chronic midline low back pain without sciatica   New onset Atrial flutter: Found to have atrial flutter with controlled ventricular response before the planned right total knee arthroplasty.   Plan for echocardiogram.  This morning,he is on NSR.  Cardiology recommended 20 mg of Xarelto  daily for minimum of 3 weeks then plan for outpatient elective cardioversion if remains in persistent atrial flutter.  Cardiology following  History of osteoarthritis right knee: Status post total knee arthroplasty on 9/23.  Orthopedics following.  Continue pain management, supportive care. PT /OT consultation as per orthopedics  History of hypertension: On Lasix , losartan  at home  Normocytic anemia: Current hemoglobin stable  Essential tremor: On primidone  as needed at home  History of multiple myeloma: Status post bone marrow transplant.  On Ninlaro  and Pomalyst   History of PE: On Xarelto  10 mg daily at home.  Plan to continue Xarelto  at 20 mg because of new onset A-flutter  Obesity: BMI of 34.4         DVT prophylaxis:rivaroxaban  (XARELTO ) tablet  20 mg Start: 06/29/24 0800 rivaroxaban  (XARELTO ) tablet 10 mg Start: 06/28/24 0800 SCDs Start: 06/27/24 2116 rivaroxaban  (XARELTO ) tablet 10 mg  rivaroxaban  (XARELTO ) tablet 20 mg     Code Status: Full Code  Procedures: Right total knee arthroplasty  Antimicrobials:  Anti-infectives (From admission, onward)    Start     Dose/Rate Route Frequency Ordered Stop   06/28/24 1000  acyclovir  (ZOVIRAX ) tablet 400 mg        400 mg Oral Daily 06/27/24 2115     06/27/24 1030  ceFAZolin  (ANCEF ) IVPB 2g/100 mL premix        2 g 200 mL/hr over 30 Minutes Intravenous On call to O.R. 06/27/24 1026 06/27/24 1513       Subjective: Patient seen and examined at bedside today.  Hemodynamically stable.  Comfortable lying in bed.  Denies any new complaints.  Remains in normal sinus rhythm.  Objective: Vitals:   06/27/24 2030 06/27/24 2114 06/27/24 2308 06/28/24 0531  BP: (!) 121/97 (!) 152/96 (!) 143/97 (!) 145/99  Pulse: 93 86 79 74  Resp: 15 16 18 18   Temp: 98.1 F (36.7 C) 97.6 F (36.4 C) 97.8 F (36.6 C) 97.8 F (36.6 C)  TempSrc:   Oral Oral  SpO2: 96% 97% 98% 98%  Weight:      Height:        Intake/Output Summary (Last 24 hours) at 06/28/2024 0745 Last data filed at 06/28/2024 0650 Gross per 24 hour  Intake 1814.13 ml  Output 1275 ml  Net 539.13 ml   Filed Weights   06/27/24 1039  Weight: 105.7 kg  Examination:  General exam: Overall comfortable, not in distress, obese HEENT: PERRL Respiratory system:  no wheezes or crackles  Cardiovascular system: S1 & S2 heard, RRR.  Gastrointestinal system: Abdomen is nondistended, soft and nontender. Central nervous system: Alert and oriented Extremities: No edema, no clubbing ,no cyanosis Skin: No rashes, no ulcers,no icterus     Data Reviewed: I have personally reviewed following labs and imaging studies  CBC: Recent Labs  Lab 06/27/24 1849 06/28/24 0345  WBC 5.5 6.8  HGB 15.9 14.1  HCT 48.6 45.0  MCV 105.7* 107.4*   PLT 200 188   Basic Metabolic Panel: Recent Labs  Lab 06/27/24 1849 06/28/24 0345  NA 137 139  K 4.1 4.6  CL 103 103  CO2 20* 23  GLUCOSE 163* 143*  BUN 15 18  CREATININE 0.83 0.83  CALCIUM  8.9 8.8*     No results found for this or any previous visit (from the past 240 hours).   Radiology Studies: DG Knee Right Port Result Date: 06/27/2024 CLINICAL DATA:  061379 S/P total knee replacement, right [id number] EXAM: PORTABLE RIGHT KNEE - 1-2 VIEW COMPARISON:  05/18/2023 FINDINGS: Well-aligned knee arthroplasty without acute fracture.No dislocation. Subcutaneous edema and gas present, an expected finding at this stage in the postoperative recovery. No radiopaque foreign body or retained surgical instrument. IMPRESSION: Well-aligned knee arthroplasty without acute fracture or dislocation.No radiopaque foreign body or retained surgical instrument. Electronically Signed   By: Rogelia Myers M.D.   On: 06/27/2024 18:04    Scheduled Meds:  acetaminophen   1,000 mg Oral Q6H   acyclovir   400 mg Oral Daily   atorvastatin   20 mg Oral Daily   calcium  carbonate  1 tablet Oral BID   cholecalciferol   2,000 Units Oral Daily   dexamethasone  (DECADRON ) injection  10 mg Intravenous Once   docusate sodium   100 mg Oral BID   empagliflozin   10 mg Oral Daily   furosemide   40 mg Oral Daily   losartan   100 mg Oral Daily   rivaroxaban   10 mg Oral Q breakfast   [START ON 06/29/2024] rivaroxaban   20 mg Oral Q supper   Continuous Infusions:   LOS: 0 days   Ivonne Mustache, MD [facility] P9/24/2025, 7:45 AM

## 2024-06-28 NOTE — Anesthesia Postprocedure Evaluation (Signed)
 Anesthesia Post Note  Patient: Jesse Mcclure.  Procedure(s) Performed: ARTHROPLASTY, KNEE, TOTAL (Right: Knee)     Patient location during evaluation: PACU Anesthesia Type: Regional and General Level of consciousness: awake Pain management: pain level controlled Vital Signs Assessment: post-procedure vital signs reviewed and stable Respiratory status: spontaneous breathing, nonlabored ventilation and respiratory function stable Cardiovascular status: blood pressure returned to baseline and stable Postop Assessment: no apparent nausea or vomiting Anesthetic complications: no Comments: Patient to have echocardiogram and see cardiology prior to discharge from the hospital for atrial flutter.    No notable events documented.  Last Vitals:  Vitals:   06/27/24 2308 06/28/24 0531  BP: (!) 143/97 (!) 145/99  Pulse: 79 74  Resp: 18 18  Temp: 36.6 C 36.6 C  SpO2: 98% 98%    Last Pain:  Vitals:   06/28/24 0531  TempSrc: Oral  PainSc:                  Bernardino SQUIBB Damond Borchers

## 2024-06-28 NOTE — Progress Notes (Signed)
 Physical Therapy Treatment Patient Details Name: Jesse Mcclure. MRN: [id number] DOB: [birth year] Today's Date: 06/28/2024   History of Present Illness 73 yo male s/p R TKA on 06/28/24, post op  incidental new onset atrial flutter. EFY:[id number], anemia, obesity, essential tremor, multiple myeloma, PE, back pain, chronic diastolic CHF, L TKA 07/15/24    PT Comments  Zell states he has had a long day of being poked and prodded and politely declines OOB this afternoon. Agreeable to TKA exercises and pt put forth good effort without incr pain.  Will continue efforts to improve mobility in preparation for d/c home. Pt is motivated to d/c home tomorrow     If plan is discharge home, recommend the following: A little help with walking and/or transfers;Assist for transportation;Help with stairs or ramp for entrance   Can travel by private vehicle        Equipment Recommendations  None recommended by PT    Recommendations for Other Services       Precautions / Restrictions Precautions Precautions: Fall;Knee Recall of Precautions/Restrictions: Intact Restrictions Weight Bearing Restrictions Per Provider Order: No RLE Weight Bearing Per Provider Order: Weight bearing as tolerated Other Position/Activity Restrictions: reviewed importance of terminal knee extension; blanket rolls placed to lateral RLE EOS to encoruage proper positioning     Mobility  Bed Mobility Overal bed mobility: Needs Assistance Bed Mobility: Supine to Sit     Supine to sit: Contact guard     General bed mobility comments:  (pt politely declined OOB d/t fatigue)    Transfers Overall transfer level: Needs assistance Equipment used: Rolling walker (2 wheels) Transfers: Sit to/from Stand Sit to Stand: Min assist           General transfer comment: cues for hand placement    Ambulation/Gait Ambulation/Gait assistance: Min assist Gait Distance (Feet): 30 Feet Assistive device: Rolling walker (2  wheels) Gait Pattern/deviations: Step-to pattern       General Gait Details: cues for sequence and RW position. assist to steady   Stairs             Wheelchair Mobility     Tilt Bed    Modified Rankin (Stroke Patients Only)       Balance Overall balance assessment: Needs assistance Sitting-balance support: No upper extremity supported, Feet supported Sitting balance-Leahy Scale: Fair     Standing balance support: During functional activity, Reliant on assistive device for balance, Bilateral upper extremity supported Standing balance-Leahy Scale: Poor                              Communication Communication Communication: No apparent difficulties  Cognition Arousal: Alert Behavior During Therapy: WFL for tasks assessed/performed   PT - Cognitive impairments: No apparent impairments                         Following commands: Intact      Cueing Cueing Techniques: Verbal cues  Exercises Total Joint Exercises Ankle Circles/Pumps: AROM, Both, 10 reps Quad Sets: AROM, Both, 10 reps Gluteal Sets: AROM, Both, 10 reps Heel Slides: AROM, Right, 10 reps, AAROM Hip ABduction/ADduction: AAROM, Right, 10 reps Straight Leg Raises: AAROM, AROM, Strengthening, Right, 10 reps Goniometric ROM: ~ -10 to 75 degrees knee flexion    General Comments        Pertinent Vitals/Pain Pain Assessment Pain Assessment: 0-10 Pain Score: 2  Pain Location: right knee Pain Descriptors /  Indicators: Sore Pain Intervention(s): Limited activity within patient's tolerance, Monitored during session, Premedicated before session, Repositioned (declined ice)    Home Living                          Prior Function            PT Goals (current goals can now be found in the care plan section) Acute Rehab PT Goals Patient Stated Goal: use rollator PT Goal Formulation: With patient Time For Goal Achievement: 07/05/24 Potential to Achieve Goals:  Good Progress towards PT goals: Progressing toward goals    Frequency    7X/week      PT Plan      Co-evaluation              AM-PAC PT 6 Clicks Mobility   Outcome Measure  Help needed turning from your back to your side while in a flat bed without using bedrails?: A Little Help needed moving from lying on your back to sitting on the side of a flat bed without using bedrails?: A Little Help needed moving to and from a bed to a chair (including a wheelchair)?: A Little Help needed standing up from a chair using your arms (e.g., wheelchair or bedside chair)?: A Little Help needed to walk in hospital room?: A Little Help needed climbing 3-5 steps with a railing? : A Little 6 Click Score: 18    End of Session   Activity Tolerance: Patient tolerated treatment well Patient left: in bed;with call bell/phone within reach;with bed alarm set Nurse Communication: Mobility status PT Visit Diagnosis: Other abnormalities of gait and mobility (R26.89);Unsteadiness on feet (R26.81)     Time: 8594-8579 PT Time Calculation (min) (ACUTE ONLY): 15 min  Charges:    $Therapeutic Exercise: 8-22 mins PT General Charges $$ ACUTE PT VISIT: 1 Visit                     Aunya Lemler, PT  Acute Rehab Dept [facility]) [phone]  06/28/2024    [facility] 06/28/2024, 4:29 PM

## 2024-06-28 NOTE — Progress Notes (Signed)
 Heart monitor ordered for flutter, heart block  Dr. Shlomo to read

## 2024-06-28 NOTE — Progress Notes (Signed)
 Changed to live zio for heart block

## 2024-06-29 ENCOUNTER — Telehealth (HOSPITAL_COMMUNITY): Payer: Self-pay | Admitting: Pharmacy Technician

## 2024-06-29 ENCOUNTER — Telehealth: Payer: Self-pay | Admitting: Cardiology

## 2024-06-29 ENCOUNTER — Other Ambulatory Visit (HOSPITAL_COMMUNITY): Payer: Self-pay

## 2024-06-29 ENCOUNTER — Encounter: Payer: Self-pay | Admitting: Hematology and Oncology

## 2024-06-29 DIAGNOSIS — I4892 Unspecified atrial flutter: Secondary | ICD-10-CM | POA: Diagnosis not present

## 2024-06-29 LAB — CBC
HCT: 41 % (ref 39.0–52.0)
Hemoglobin: 13.3 g/dL (ref 13.0–17.0)
MCH: 34.8 pg — ABNORMAL HIGH (ref 26.0–34.0)
MCHC: 32.4 g/dL (ref 30.0–36.0)
MCV: 107.3 fL — ABNORMAL HIGH (ref 80.0–100.0)
Platelets: 169 K/uL (ref 150–400)
RBC: 3.82 MIL/uL — ABNORMAL LOW (ref 4.22–5.81)
RDW: 15.7 % — ABNORMAL HIGH (ref 11.5–15.5)
WBC: 7.7 K/uL (ref 4.0–10.5)
nRBC: 0 % (ref 0.0–0.2)

## 2024-06-29 MED ORDER — OXYCODONE HCL 10 MG PO TABS
10.0000 mg | ORAL_TABLET | ORAL | 0 refills | Status: DC | PRN
  Filled 2024-06-29: qty 30, 5d supply, fill #0

## 2024-06-29 MED ORDER — METHOCARBAMOL 500 MG PO TABS
500.0000 mg | ORAL_TABLET | Freq: Four times a day (QID) | ORAL | 0 refills | Status: DC | PRN
  Filled 2024-06-29: qty 30, 8d supply, fill #0

## 2024-06-29 MED ORDER — SENNA-DOCUSATE SODIUM 8.6-50 MG PO TABS
2.0000 | ORAL_TABLET | Freq: Every day | ORAL | 1 refills | Status: AC
  Filled 2024-06-29: qty 30, 15d supply, fill #0
  Filled 2024-08-12: qty 30, 15d supply, fill #1

## 2024-06-29 MED ORDER — RIVAROXABAN 20 MG PO TABS
20.0000 mg | ORAL_TABLET | Freq: Every day | ORAL | 0 refills | Status: DC
  Filled 2024-06-29: qty 30, 30d supply, fill #0

## 2024-06-29 MED ORDER — ONDANSETRON HCL 4 MG PO TABS
4.0000 mg | ORAL_TABLET | Freq: Three times a day (TID) | ORAL | 0 refills | Status: AC | PRN
  Filled 2024-06-29: qty 10, 4d supply, fill #0

## 2024-06-29 NOTE — Progress Notes (Signed)
 Discharge medications delivered to patient at bedside in a secure bag. Discharge instructions given to patient questions asked and answered.

## 2024-06-29 NOTE — Telephone Encounter (Signed)
 Explained that the monitor will be mailed to the pt's address and instructions will be included. They can call our office with any questions.   She verbalized understanding.

## 2024-06-29 NOTE — Plan of Care (Signed)
  Problem: Pain Management: Goal: Pain level will decrease with appropriate interventions Outcome: Progressing   Problem: Activity: Goal: Range of joint motion will improve Outcome: Progressing   Problem: Education: Goal: Knowledge of the prescribed therapeutic regimen will improve Outcome: Progressing   Problem: Safety: Goal: Ability to remain free from injury will improve Outcome: Progressing   Problem: Elimination: Goal: Will not experience complications related to urinary retention Outcome: Progressing

## 2024-06-29 NOTE — Discharge Summary (Signed)
 Discharge Summary  Patient ID: Jesse Mcclure. MRN: [id number] DOB/AGE: 04/20/1951 73 y.o.  Admit date: 06/27/2024 Discharge date: 06/29/2024  Admission Diagnoses:  New onset atrial flutter [facility])  Discharge Diagnoses:  Principal Problem:   New onset atrial flutter (HCC) Active Problems:   Primary hypertension   Obesity (BMI 30-39.9)   Multiple myeloma without remission (HCC)   S/P bone marrow transplant (HCC)   Peripheral neuropathy due to chemotherapy   Deficiency anemia   History of pulmonary embolus (PE)   Essential tremor   (HFpEF) heart failure with preserved ejection fraction (HCC)   Chronic midline low back pain without sciatica   Second degree AV block, Mobitz type I   Chronic diastolic CHF (congestive heart failure) (HCC)   Past Medical History:  Diagnosis Date   Arthritis    Blood dyscrasia    hemachromatosis   Bone loss of mandible    BPH (benign prostatic hypertrophy)    CHF (congestive heart failure) (HCC)    Degenerative disc disease    Diverticulosis    Hemochromatosis    HTN (hypertension)    Hyperlipidemia    Multiple myeloma (HCC)    Neuromuscular disorder (HCC)    Occasional tremors    takes Primadone   Polio    as a child   Pulmonary embolism (HCC) 07/2021   after  Left TKA   Status post Nissen fundoplication (without gastrostomy tube) procedure    for hiatal hernia.    Surgeries: Procedure(s): ARTHROPLASTY, KNEE, TOTAL on 06/27/2024   Consultants (if any): Treatment Team:  Seena Marsa NOVAK, MD Jillian Buttery, MD  Discharged Condition: Improved  Hospital Course: Jesse Mcclure. is an 73 y.o. adult who was admitted 06/27/2024 after a right total knee replacement and went to the operating room on 06/27/2024 and underwent the above named procedures.  In pre-op he developed atrial flutter. He converted back to NSR sometime after the procedure. He was seen by cardiology who recommended increasing dose of Xarelto  20 mg. He will plan  to have zio patch for 2 weeks and follow up with cardiology as an outpatient.   Today's  total administered Morphine  Milligram Equivalents: 35 Yesterday's total administered Morphine  Milligram Equivalents: 90  He was given perioperative antibiotics:  Anti-infectives (From admission, onward)    Start     Dose/Rate Route Frequency Ordered Stop   06/28/24 1000  acyclovir  (ZOVIRAX ) tablet 400 mg        400 mg Oral Daily 06/27/24 2115     06/27/24 1030  ceFAZolin  (ANCEF ) IVPB 2g/100 mL premix        2 g 200 mL/hr over 30 Minutes Intravenous On call to O.R. 06/27/24 1026 06/27/24 1513     .  He was given sequential compression devices, early ambulation, and Xarelto  for DVT prophylaxis.   Recent vital signs:  Vitals:   06/29/24 0335 06/29/24 0830  BP: 127/79 (!) 110/53  Pulse: (!) 47 61  Resp: 17 16  Temp: 97.8 F (36.6 C) 98.3 F (36.8 C)  SpO2: 98% 96%    Recent laboratory studies:  Lab Results  Component Value Date   HGB 13.3 06/29/2024   HGB 14.1 06/28/2024   HGB 15.9 06/27/2024   Lab Results  Component Value Date   WBC 7.7 06/29/2024   PLT 169 06/29/2024   Lab Results  Component Value Date   INR 1.0 04/14/2019   Lab Results  Component Value Date   NA 139 06/28/2024   K 4.6  06/28/2024   CL 103 06/28/2024   CO2 23 06/28/2024   BUN 18 06/28/2024   CREATININE 0.83 06/28/2024   GLUCOSE 143 (H) 06/28/2024    Discharge Medications:   Allergies as of 06/29/2024   No Known Allergies      Medication List     TAKE these medications    acetaminophen  500 MG tablet Commonly known as: TYLENOL  Take 1,000 mg by mouth every 6 (six) hours as needed.   acyclovir  400 MG tablet Commonly known as: ZOVIRAX  Take 1 tablet (400 mg total) by mouth daily.   atorvastatin  20 MG tablet Commonly known as: LIPITOR Take 1 tablet (20 mg total) by mouth daily.   calcium  carbonate 500 MG chewable tablet Commonly known as: TUMS - dosed in mg elemental calcium  Chew 1 tablet by  mouth 2 (two) times daily.   cyanocobalamin  1000 MCG tablet Commonly known as: VITAMIN B12 Take 1,000 mcg by mouth daily.   folic acid  400 MCG tablet Commonly known as: FOLVITE  Take 400 mcg by mouth daily.   furosemide  40 MG tablet Commonly known as: LASIX  Take 1 tablet (40 mg total) by mouth daily.   ixazomib citrate  3 MG capsule Commonly known as: Ninlaro  Take 1 capsule (3 mg) by mouth weekly, 3 weeks on, 1 week off, repeat every 4 weeks. Take on an empty stomach 1hr before or 2hr after meals.   Jardiance  10 MG Tabs tablet Generic drug: empagliflozin  Take 1 tablet (10 mg total) by mouth daily.   losartan  100 MG tablet Commonly known as: Cozaar  Take 1 tablet (100 mg total) by mouth daily.   methocarbamol  500 MG tablet Commonly known as: ROBAXIN  Take 1 tablet (500 mg total) by mouth every 6 (six) hours as needed for muscle spasms.   Multi-Vitamin tablet Take 1 tablet by mouth daily.   ondansetron  4 MG tablet Commonly known as: Zofran  Take 1 tablet (4 mg total) by mouth every 8 (eight) hours as needed for nausea or vomiting.   oxyCODONE  5 MG immediate release tablet Commonly known as: Oxy IR/ROXICODONE  Take 1 tablet (5 mg total) by mouth every 4 (four) hours as needed for severe pain (pain score 7-10). What changed: Another medication with the same name was added. Make sure you understand how and when to take each.   Oxycodone  HCl 10 MG Tabs Take 1 tablet (10 mg total) by mouth every 4 (four) hours as needed (severe pain). Okay to take with baseline 5 mg oxycodone  for 15 mg every 4 hours as needed. What changed: You were already taking a medication with the same name, and this prescription was added. Make sure you understand how and when to take each.   pomalidomide  3 MG capsule Commonly known as: Pomalyst  Take 1 capsule (3 mg total) by mouth daily for 21 days. [employer] Auth # [id number]     Date Obtained 06/08/2024   primidone  50 MG tablet Commonly known as:  MYSOLINE  Take 1 tablet (50 mg total) by mouth at bedtime. What changed:  when to take this reasons to take this   prochlorperazine  10 MG tablet Commonly known as: COMPAZINE  TAKE 1 TABLET(10 MG) BY MOUTH EVERY 6 HOURS AS NEEDED FOR NAUSEA OR VOMITING   rivaroxaban  20 MG Tabs tablet Commonly known as: XARELTO  Take 1 tablet (20 mg total) by mouth daily with supper. Start taking on: June 30, 2024 What changed:  medication strength how much to take   sennosides-docusate sodium  8.6-50 MG tablet Commonly known as: SENOKOT-S Take 2  tablets by mouth daily.   SYSTANE OP Place 1 drop into both eyes daily as needed (dry eyes).   Vitamin D  50 MCG (2000 UT) tablet Take 2,000 Units by mouth daily.        Diagnostic Studies: ECHOCARDIOGRAM COMPLETE Result Date: 06/28/2024    ECHOCARDIOGRAM REPORT   Patient Name:   Jesse Mcclure. Date of Exam: 06/28/2024 Medical Rec #:  [id number]           Height:       69.0 in Accession #:    [id number]          Weight:       233.0 lb Date of Birth:  02-27-1951           BSA:          2.205 m Patient Age:    72 years            BP:           136/90 mmHg Patient Gender: M                   HR:           92 bpm. Exam Location:  Inpatient Procedure: 2D Echo and Intracardiac Opacification Agent (Both Spectral and Color            Flow Doppler were utilized during procedure). Indications:    Atrial Flutter  History:        Patient has prior history of Echocardiogram examinations. CHF,                 Arrythmias:Atrial Flutter; Risk Factors:Hypertension.  Sonographer:    Charmaine Gaskins Referring Phys: [id number] LOGAN N LOCKWOOD  Sonographer Comments: Patient is obese and Technically difficult study due to poor echo windows. IMPRESSIONS  1. Left ventricular ejection fraction, by estimation, is 70 to 75%. The left ventricle has hyperdynamic function. The left ventricle has no regional wall motion abnormalities. Left ventricular diastolic parameters are indeterminate.   2. Right ventricular systolic function is normal. The right ventricular size is normal. There is mildly elevated pulmonary artery systolic pressure.  3. Left atrial size was mildly dilated.  4. A small pericardial effusion is present.  5. The mitral valve is normal in structure. No evidence of mitral valve regurgitation. No evidence of mitral stenosis.  6. The aortic valve is tricuspid. Aortic valve regurgitation is not visualized. Aortic valve sclerosis is present, with no evidence of aortic valve stenosis.  7. The inferior vena cava is normal in size with greater than 50% respiratory variability, suggesting right atrial pressure of 3 mmHg. FINDINGS  Left Ventricle: Left ventricular ejection fraction, by estimation, is 70 to 75%. The left ventricle has hyperdynamic function. The left ventricle has no regional wall motion abnormalities. The left ventricular internal cavity size was normal in size. There is no left ventricular hypertrophy. Left ventricular diastolic parameters are indeterminate. Right Ventricle: The right ventricular size is normal. Right ventricular systolic function is normal. There is mildly elevated pulmonary artery systolic pressure. The tricuspid regurgitant velocity is 3.18 m/s, and with an assumed right atrial pressure of 3 mmHg, the estimated right ventricular systolic pressure is 43.4 mmHg. Left Atrium: Left atrial size was mildly dilated. Right Atrium: Right atrial size was normal in size. Pericardium: A small pericardial effusion is present. Mitral Valve: The mitral valve is normal in structure. No evidence of mitral valve regurgitation. No evidence of mitral valve stenosis. Tricuspid Valve: The tricuspid valve  is normal in structure. Tricuspid valve regurgitation is mild . No evidence of tricuspid stenosis. Aortic Valve: The aortic valve is tricuspid. Aortic valve regurgitation is not visualized. Aortic valve sclerosis is present, with no evidence of aortic valve stenosis. Pulmonic  Valve: The pulmonic valve was normal in structure. Pulmonic valve regurgitation is not visualized. No evidence of pulmonic stenosis. Aorta: The aortic root is normal in size and structure. Venous: The inferior vena cava is normal in size with greater than 50% respiratory variability, suggesting right atrial pressure of 3 mmHg. IAS/Shunts: No atrial level shunt detected by color flow Doppler.  LEFT VENTRICLE PLAX 2D LVIDd:         4.30 cm      Diastology LVIDs:         2.60 cm      LV e' medial:    6.64 cm/s LV PW:         0.90 cm      LV E/e' medial:  9.4 LV IVS:        0.80 cm      LV e' lateral:   9.03 cm/s LVOT diam:     2.10 cm      LV E/e' lateral: 6.9 LVOT Area:     3.46 cm  LV Volumes (MOD) LV vol d, MOD A2C: 102.0 ml LV vol d, MOD A4C: 102.0 ml LV vol s, MOD A2C: 25.7 ml LV vol s, MOD A4C: 25.9 ml LV SV MOD A2C:     76.3 ml LV SV MOD A4C:     102.0 ml LV SV MOD BP:      77.3 ml RIGHT VENTRICLE RV Basal diam:  3.40 cm RV Mid diam:    3.00 cm RV S prime:     10.40 cm/s LEFT ATRIUM             Index LA diam:        3.70 cm 1.68 cm/m LA Vol (A2C):   70.3 ml 31.89 ml/m LA Vol (A4C):   85.3 ml 38.69 ml/m LA Biplane Vol: 80.7 ml 36.61 ml/m   AORTA Ao Asc diam: 3.00 cm MITRAL VALVE               TRICUSPID VALVE MV Area (PHT): 3.44 cm    TR Peak grad:   40.4 mmHg MV E velocity: 62.60 cm/s  TR Vmax:        318.00 cm/s MV A velocity: 98.90 cm/s MV E/A ratio:  0.63        SHUNTS                            Systemic Diam: 2.10 cm Redell Shallow MD Electronically signed by Redell Shallow MD Signature Date/Time: 06/28/2024/1:34:05 PM    Final    DG Knee Right Port Result Date: 06/27/2024 CLINICAL DATA:  061379 S/P total knee replacement, right [id number] EXAM: PORTABLE RIGHT KNEE - 1-2 VIEW COMPARISON:  05/18/2023 FINDINGS: Well-aligned knee arthroplasty without acute fracture.No dislocation. Subcutaneous edema and gas present, an expected finding at this stage in the postoperative recovery. No radiopaque foreign body  or retained surgical instrument. IMPRESSION: Well-aligned knee arthroplasty without acute fracture or dislocation.No radiopaque foreign body or retained surgical instrument. Electronically Signed   By: Rogelia Myers M.D.   On: 06/27/2024 18:04    Disposition: Discharge disposition: 01-Home or Self Care          Follow-up Information  Josefina Chew, MD. Go on 07/12/2024.   Specialty: Orthopedic Surgery Why: your appointment is at 10:15. Contact information: [address] [place] [address] [phone]         [facility], [place]. Go on 06/30/2024.   Why: your outpatient physical therapy is scheduled. the office will call you with a time Contact information: [facility] [address] [place] [address] [phone]                  Signed: Army MARLA Delores DEVONNA 06/29/2024, 11:39 AM

## 2024-06-29 NOTE — Progress Notes (Signed)
 Spoke with [facility] PA. Stated that Zio patch will be mailed to patient's home. Patient clear to D/C.

## 2024-06-29 NOTE — Progress Notes (Signed)
 PROGRESS NOTE  Elsie JINNY Percy Mickey.  FMW:[id number] DOB: 1951-04-24 DOA: 06/27/2024 PCP: [name], MD   Brief Narrative: Patient is a 73 year old with history of hypertension, anemia, obesity, tremor, multiple myeloma, PE, back pain, chronic diastolic CHF who presented for elective right total knee arthroplasty.  Before the procedure, he was found to have A flutter with controlled ventricular response.  Patient was asymptomatic.  Cardiology consulted.  Currently he remains in normal sinus rhythm.  Discharge plan as per orthopedics.  He will be followed by cardiology as outpatient, plan to start on Zio patch  Assessment & Plan:  Principal Problem:   New onset atrial flutter (HCC) Active Problems:   Primary hypertension   Obesity (BMI 30-39.9)   Multiple myeloma without remission (HCC)   S/P bone marrow transplant (HCC)   Peripheral neuropathy due to chemotherapy   Deficiency anemia   History of pulmonary embolus (PE)   Essential tremor   (HFpEF) heart failure with preserved ejection fraction (HCC)   Chronic midline low back pain without sciatica   Second degree AV block, Mobitz type I   Chronic diastolic CHF (congestive heart failure) (HCC)   New onset Atrial flutter/second degree AV block: Found to have atrial flutter with controlled ventricular response before the planned right total knee arthroplasty.   This morning,he remains on NSR.  Cardiology recommended 20 mg of Xarelto  daily for minimum of 3 weeks then plan for outpatient elective cardioversion if remains in persistent atrial flutter.  Cardiology will follow him as an outpatient.  Plan to put him on Zio patch for 2 weeks.  As per cardiology, it will be mailed to patient's home Echo showed EF of 70- 75%, no wall motion abnormality, mildly  elevated pulmonary artery pressure.  History of osteoarthritis right knee: Status post total knee arthroplasty on 9/23.  Orthopedics following.  Continue pain management, supportive  care. PT /OT consultation done  History of hypertension: On Lasix , losartan  at home  Normocytic anemia: Current hemoglobin stable  Essential tremor: On primidone  as needed at home  History of multiple myeloma: Status post bone marrow transplant.  On Ninlaro  and Pomalyst   History of PE: On Xarelto  10 mg daily at home.  Plan to continue Xarelto  at 20 mg because of new onset A-flutter  Obesity: BMI of 34.4         DVT prophylaxis:rivaroxaban  (XARELTO ) tablet 20 mg Start: 06/29/24 0800 SCDs Start: 06/27/24 2116 rivaroxaban  (XARELTO ) tablet 20 mg     Code Status: Full Code  Procedures: Right total knee arthroplasty  Antimicrobials:  Anti-infectives (From admission, onward)    Start     Dose/Rate Route Frequency Ordered Stop   06/28/24 1000  acyclovir  (ZOVIRAX ) tablet 400 mg        400 mg Oral Daily 06/27/24 2115     06/27/24 1030  ceFAZolin  (ANCEF ) IVPB 2g/100 mL premix        2 g 200 mL/hr over 30 Minutes Intravenous On call to O.R. 06/27/24 1026 06/27/24 1513       Subjective: Patient seen and examined at bedside today.  Comfortable.  Very eager to go home.  Remains in normal sinus rhythm.  Denies any new complaints today.  Objective: Vitals:   06/28/24 0951 06/28/24 1358 06/28/24 2030 06/29/24 0335  BP: (!) 136/90 121/88 126/81 127/79  Pulse: 80 (!) 107 83 (!) 47  Resp: 16 16 16 17   Temp: 97.9 F (36.6 C) (!) 97.5 F (36.4 C) 98.4 F (36.9 C) 97.8 F (  36.6 C)  TempSrc: Oral Oral Oral   SpO2: 94% 95% 93% 98%  Weight:      Height:        Intake/Output Summary (Last 24 hours) at 06/29/2024 1052 Last data filed at 06/29/2024 1040 Gross per 24 hour  Intake 840 ml  Output 300 ml  Net 540 ml   Filed Weights   06/27/24 1039  Weight: 105.7 kg    Examination:   General exam: Overall comfortable, not in distress,obese HEENT: PERRL Respiratory system:  no wheezes or crackles  Cardiovascular system: S1 & S2 heard, RRR.  Gastrointestinal system: Abdomen  is nondistended, soft and nontender. Central nervous system: Alert and oriented Extremities: No edema, no clubbing ,no cyanosis, dressing on the right knee Skin: No rashes, no ulcers,no icterus     Data Reviewed: I have personally reviewed following labs and imaging studies  CBC: Recent Labs  Lab 06/27/24 1849 06/28/24 0345 06/29/24 0338  WBC 5.5 6.8 7.7  HGB 15.9 14.1 13.3  HCT 48.6 45.0 41.0  MCV 105.7* 107.4* 107.3*  PLT 200 188 169   Basic Metabolic Panel: Recent Labs  Lab 06/27/24 1849 06/28/24 0345  NA 137 139  K 4.1 4.6  CL 103 103  CO2 20* 23  GLUCOSE 163* 143*  BUN 15 18  CREATININE 0.83 0.83  CALCIUM  8.9 8.8*  MG  --  2.2     No results found for this or any previous visit (from the past 240 hours).   Radiology Studies: ECHOCARDIOGRAM COMPLETE Result Date: 06/28/2024    ECHOCARDIOGRAM REPORT   Patient Name:   Farah Benish. Date of Exam: 06/28/2024 Medical Rec #:  [id number]           Height:       69.0 in Accession #:    [id number]          Weight:       233.0 lb Date of Birth:  May 17, 1951           BSA:          2.205 m Patient Age:    72 years            BP:           136/90 mmHg Patient Gender: M                   HR:           92 bpm. Exam Location:  Inpatient Procedure: 2D Echo and Intracardiac Opacification Agent (Both Spectral and Color            Flow Doppler were utilized during procedure). Indications:    Atrial Flutter  History:        Patient has prior history of Echocardiogram examinations. CHF,                 Arrythmias:Atrial Flutter; Risk Factors:Hypertension.  Sonographer:    Charmaine Gaskins Referring Phys: [id number] LOGAN N LOCKWOOD  Sonographer Comments: Patient is obese and Technically difficult study due to poor echo windows. IMPRESSIONS  1. Left ventricular ejection fraction, by estimation, is 70 to 75%. The left ventricle has hyperdynamic function. The left ventricle has no regional wall motion abnormalities. Left ventricular diastolic  parameters are indeterminate.  2. Right ventricular systolic function is normal. The right ventricular size is normal. There is mildly elevated pulmonary artery systolic pressure.  3. Left atrial size was mildly dilated.  4. A small pericardial effusion is present.  5. The mitral valve is normal in structure. No evidence of mitral valve regurgitation. No evidence of mitral stenosis.  6. The aortic valve is tricuspid. Aortic valve regurgitation is not visualized. Aortic valve sclerosis is present, with no evidence of aortic valve stenosis.  7. The inferior vena cava is normal in size with greater than 50% respiratory variability, suggesting right atrial pressure of 3 mmHg. FINDINGS  Left Ventricle: Left ventricular ejection fraction, by estimation, is 70 to 75%. The left ventricle has hyperdynamic function. The left ventricle has no regional wall motion abnormalities. The left ventricular internal cavity size was normal in size. There is no left ventricular hypertrophy. Left ventricular diastolic parameters are indeterminate. Right Ventricle: The right ventricular size is normal. Right ventricular systolic function is normal. There is mildly elevated pulmonary artery systolic pressure. The tricuspid regurgitant velocity is 3.18 m/s, and with an assumed right atrial pressure of 3 mmHg, the estimated right ventricular systolic pressure is 43.4 mmHg. Left Atrium: Left atrial size was mildly dilated. Right Atrium: Right atrial size was normal in size. Pericardium: A small pericardial effusion is present. Mitral Valve: The mitral valve is normal in structure. No evidence of mitral valve regurgitation. No evidence of mitral valve stenosis. Tricuspid Valve: The tricuspid valve is normal in structure. Tricuspid valve regurgitation is mild . No evidence of tricuspid stenosis. Aortic Valve: The aortic valve is tricuspid. Aortic valve regurgitation is not visualized. Aortic valve sclerosis is present, with no evidence of aortic  valve stenosis. Pulmonic Valve: The pulmonic valve was normal in structure. Pulmonic valve regurgitation is not visualized. No evidence of pulmonic stenosis. Aorta: The aortic root is normal in size and structure. Venous: The inferior vena cava is normal in size with greater than 50% respiratory variability, suggesting right atrial pressure of 3 mmHg. IAS/Shunts: No atrial level shunt detected by color flow Doppler.  LEFT VENTRICLE PLAX 2D LVIDd:         4.30 cm      Diastology LVIDs:         2.60 cm      LV e' medial:    6.64 cm/s LV PW:         0.90 cm      LV E/e' medial:  9.4 LV IVS:        0.80 cm      LV e' lateral:   9.03 cm/s LVOT diam:     2.10 cm      LV E/e' lateral: 6.9 LVOT Area:     3.46 cm  LV Volumes (MOD) LV vol d, MOD A2C: 102.0 ml LV vol d, MOD A4C: 102.0 ml LV vol s, MOD A2C: 25.7 ml LV vol s, MOD A4C: 25.9 ml LV SV MOD A2C:     76.3 ml LV SV MOD A4C:     102.0 ml LV SV MOD BP:      77.3 ml RIGHT VENTRICLE RV Basal diam:  3.40 cm RV Mid diam:    3.00 cm RV S prime:     10.40 cm/s LEFT ATRIUM             Index LA diam:        3.70 cm 1.68 cm/m LA Vol (A2C):   70.3 ml 31.89 ml/m LA Vol (A4C):   85.3 ml 38.69 ml/m LA Biplane Vol: 80.7 ml 36.61 ml/m   AORTA Ao Asc diam: 3.00 cm MITRAL VALVE  TRICUSPID VALVE MV Area (PHT): 3.44 cm    TR Peak grad:   40.4 mmHg MV E velocity: 62.60 cm/s  TR Vmax:        318.00 cm/s MV A velocity: 98.90 cm/s MV E/A ratio:  0.63        SHUNTS                            Systemic Diam: 2.10 cm Redell Shallow MD Electronically signed by Redell Shallow MD Signature Date/Time: 06/28/2024/1:34:05 PM    Final    DG Knee Right Port Result Date: 06/27/2024 CLINICAL DATA:  061379 S/P total knee replacement, right [id number] EXAM: PORTABLE RIGHT KNEE - 1-2 VIEW COMPARISON:  05/18/2023 FINDINGS: Well-aligned knee arthroplasty without acute fracture.No dislocation. Subcutaneous edema and gas present, an expected finding at this stage in the postoperative recovery. No  radiopaque foreign body or retained surgical instrument. IMPRESSION: Well-aligned knee arthroplasty without acute fracture or dislocation.No radiopaque foreign body or retained surgical instrument. Electronically Signed   By: Rogelia Myers M.D.   On: 06/27/2024 18:04    Scheduled Meds:  acyclovir   400 mg Oral Daily   atorvastatin   20 mg Oral Daily   calcium  carbonate  1 tablet Oral BID   cholecalciferol   2,000 Units Oral Daily   docusate sodium   100 mg Oral BID   empagliflozin   10 mg Oral Daily   furosemide   40 mg Oral Daily   losartan   100 mg Oral Daily   rivaroxaban   20 mg Oral Q supper   Continuous Infusions:   LOS: 1 day   Ivonne Mustache, MD [facility] P9/25/2025, 10:52 AM

## 2024-06-29 NOTE — Progress Notes (Addendum)
 Pt showing afib with bradycardia on tele monitor, HR ranging from 37-60. VS: Temp 97.8 F, BP 127/79, Resp 17, SpO2 98 RA. Pt denies chest pain, dizziness, palpitations, and SOB. Notified [facility] floor coverage NP. Ordered received for 12 lead EKG.

## 2024-06-29 NOTE — Telephone Encounter (Signed)
 Caller Marlynn) wants to know if patient's heart monitor will be placed prior to patient being discharged or in outpatient setting.  Caller noted patient is being discharged today (9/25).

## 2024-06-29 NOTE — Progress Notes (Signed)
 Physical Therapy Treatment Patient Details Name: Jesse Mcclure. MRN: [id number] DOB: 21-May-1951 Today's Date: 06/29/2024   History of Present Illness 73 yo male s/p R TKA on 06/28/24, post op  incidental new onset atrial flutter. EFY:[id number], anemia, obesity, essential tremor, multiple myeloma, PE, back pain, chronic diastolic CHF, L TKA 07/15/24    PT Comments  Pt is progressing well with mobility, he ambulated 110' with RW, no loss of balance. HR 91 while walking. Stair training completed. Pt demonstrates good understanding of HEP. He is ready to DC home from a PT standpoint.    If plan is discharge home, recommend the following: A little help with walking and/or transfers;Assist for transportation;Help with stairs or ramp for entrance   Can travel by private vehicle        Equipment Recommendations  None recommended by PT    Recommendations for Other Services       Precautions / Restrictions Precautions Precautions: Fall;Knee Precaution Booklet Issued: Yes (comment) Recall of Precautions/Restrictions: Intact Precaution/Restrictions Comments: reviewed no pillow under knee Restrictions Weight Bearing Restrictions Per Provider Order: No RLE Weight Bearing Per Provider Order: Weight bearing as tolerated     Mobility  Bed Mobility Overal bed mobility: Modified Independent       Supine to sit: Modified independent (Device/Increase time), HOB elevated, Used rails          Transfers Overall transfer level: Needs assistance Equipment used: Rolling walker (2 wheels) Transfers: Sit to/from Stand Sit to Stand: Supervision           General transfer comment: cues for hand placement    Ambulation/Gait Ambulation/Gait assistance: Supervision Gait Distance (Feet): 110 Feet Assistive device: Rolling walker (2 wheels) Gait Pattern/deviations: Step-to pattern Gait velocity: decr     General Gait Details: cues for sequence and RW position. no  LOB   Stairs Stairs: Yes Stairs assistance: Contact guard assist Stair Management: Forwards, One rail Right, Step to pattern, With cane Number of Stairs: 3 General stair comments: VCs sequencing   Wheelchair Mobility     Tilt Bed    Modified Rankin (Stroke Patients Only)       Balance Overall balance assessment: Needs assistance Sitting-balance support: No upper extremity supported, Feet supported Sitting balance-Leahy Scale: Fair     Standing balance support: During functional activity, Reliant on assistive device for balance, Bilateral upper extremity supported Standing balance-Leahy Scale: Poor                              Communication Communication Communication: No apparent difficulties  Cognition Arousal: Alert Behavior During Therapy: WFL for tasks assessed/performed   PT - Cognitive impairments: No apparent impairments                         Following commands: Intact      Cueing Cueing Techniques: Verbal cues  Exercises Total Joint Exercises Ankle Circles/Pumps: AROM, Both, 10 reps Quad Sets: AROM, Both, 5 reps Short Arc Quad: AROM, Right, 10 reps, Supine Heel Slides: Right, 10 reps, AAROM, Supine Hip ABduction/ADduction: AAROM, Right, 10 reps Straight Leg Raises: AAROM, Strengthening, Right, 10 reps Long Arc Quad: AROM, Right, 10 reps, Seated Knee Flexion: AAROM, Right, 10 reps, Seated Goniometric ROM: ~5-75* AAROM R knee    General Comments        Pertinent Vitals/Pain Pain Assessment Pain Score: 8  Pain Location: right knee walking Pain Descriptors / Indicators:  Sore Pain Intervention(s): Limited activity within patient's tolerance, Monitored during session, Premedicated before session, Ice applied, Repositioned    Home Living                          Prior Function            PT Goals (current goals can now be found in the care plan section) Acute Rehab PT Goals Patient Stated Goal: to not walk  like a penguin PT Goal Formulation: With patient Time For Goal Achievement: 07/05/24 Potential to Achieve Goals: Good Progress towards PT goals: Goals met/education completed, patient discharged from PT    Frequency    7X/week      PT Plan      Co-evaluation              AM-PAC PT 6 Clicks Mobility   Outcome Measure  Help needed turning from your back to your side while in a flat bed without using bedrails?: None Help needed moving from lying on your back to sitting on the side of a flat bed without using bedrails?: A Little Help needed moving to and from a bed to a chair (including a wheelchair)?: None Help needed standing up from a chair using your arms (e.g., wheelchair or bedside chair)?: None Help needed to walk in hospital room?: None Help needed climbing 3-5 steps with a railing? : A Little 6 Click Score: 22    End of Session Equipment Utilized During Treatment: Gait belt Activity Tolerance: Patient tolerated treatment well Patient left: with call bell/phone within reach;Other (comment) (on toilet) Nurse Communication: Mobility status PT Visit Diagnosis: Other abnormalities of gait and mobility (R26.89);Unsteadiness on feet (R26.81)     Time: 8950-8866 PT Time Calculation (min) (ACUTE ONLY): 44 min  Charges:    $Gait Training: 8-22 mins $Therapeutic Exercise: 8-22 mins $Therapeutic Activity: 8-22 mins PT General Charges $$ ACUTE PT VISIT: 1 Visit                     Sylvan Delon Copp PT 06/29/2024  Acute Rehabilitation Services  Office [phone]

## 2024-06-29 NOTE — Progress Notes (Signed)
 Subjective: 2 Days Post-Op s/p Procedure(s): ARTHROPLASTY, KNEE, TOTAL   Patient is alert, oriented. Reports pain as so far well controlled.  Voiding on own  Denies chest pain, SOB, Calf pain. No nausea/vomiting.  No other complaints. Eager to discharge home today.     Objective:  PE: VITALS:   Vitals:   06/28/24 0951 06/28/24 1358 06/28/24 2030 06/29/24 0335  BP: (!) 136/90 121/88 126/81 127/79  Pulse: 80 (!) 107 83 (!) 47  Resp: 16 16 16 17   Temp: 97.9 F (36.6 C) (!) 97.5 F (36.4 C) 98.4 F (36.9 C) 97.8 F (36.6 C)  TempSrc: Oral Oral Oral   SpO2: 94% 95% 93% 98%  Weight:      Height:       Sitting up in bed, in no acute distress Normal respiratory effort Sensation intact distally Intact pulses distally Dorsiflexion/Plantar flexion intact Incision: scant drainage, new mepilex placed today  LABS  Results for orders placed or performed during the hospital encounter of 06/27/24 (from the past 24 hours)  CBC     Status: Abnormal   Collection Time: 06/29/24  3:38 AM  Result Value Ref Range   WBC 7.7 4.0 - 10.5 K/uL   RBC 3.82 (L) 4.22 - 5.81 MIL/uL   Hemoglobin 13.3 13.0 - 17.0 g/dL   HCT 58.9 60.9 - 47.9 %   MCV 107.3 (H) 80.0 - 100.0 fL   MCH 34.8 (H) 26.0 - 34.0 pg   MCHC 32.4 30.0 - 36.0 g/dL   RDW 84.2 (H) 88.4 - 84.4 %   Platelets 169 150 - 400 K/uL   nRBC 0.0 0.0 - 0.2 %   *Note: Due to a large number of results and/or encounters for the requested time period, some results have not been displayed. A complete set of results can be found in Results Review.    ECHOCARDIOGRAM COMPLETE Result Date: 06/28/2024    ECHOCARDIOGRAM REPORT   Patient Name:   Jesse Mcclure. Date of Exam: 06/28/2024 Medical Rec #:  [id number]           Height:       69.0 in Accession #:    [id number]          Weight:       233.0 lb Date of Birth:  04-29-1951           BSA:          2.205 m Patient Age:    72 years            BP:           136/90 mmHg Patient Gender: M                    HR:           92 bpm. Exam Location:  Inpatient Procedure: 2D Echo and Intracardiac Opacification Agent (Both Spectral and Color            Flow Doppler were utilized during procedure). Indications:    Atrial Flutter  History:        Patient has prior history of Echocardiogram examinations. CHF,                 Arrythmias:Atrial Flutter; Risk Factors:Hypertension.  Sonographer:    Charmaine Gaskins Referring Phys: [id number] LOGAN N LOCKWOOD  Sonographer Comments: Patient is obese and Technically difficult study due to poor echo windows. IMPRESSIONS  1. Left ventricular ejection fraction, by estimation,  is 70 to 75%. The left ventricle has hyperdynamic function. The left ventricle has no regional wall motion abnormalities. Left ventricular diastolic parameters are indeterminate.  2. Right ventricular systolic function is normal. The right ventricular size is normal. There is mildly elevated pulmonary artery systolic pressure.  3. Left atrial size was mildly dilated.  4. A small pericardial effusion is present.  5. The mitral valve is normal in structure. No evidence of mitral valve regurgitation. No evidence of mitral stenosis.  6. The aortic valve is tricuspid. Aortic valve regurgitation is not visualized. Aortic valve sclerosis is present, with no evidence of aortic valve stenosis.  7. The inferior vena cava is normal in size with greater than 50% respiratory variability, suggesting right atrial pressure of 3 mmHg. FINDINGS  Left Ventricle: Left ventricular ejection fraction, by estimation, is 70 to 75%. The left ventricle has hyperdynamic function. The left ventricle has no regional wall motion abnormalities. The left ventricular internal cavity size was normal in size. There is no left ventricular hypertrophy. Left ventricular diastolic parameters are indeterminate. Right Ventricle: The right ventricular size is normal. Right ventricular systolic function is normal. There is mildly elevated pulmonary artery  systolic pressure. The tricuspid regurgitant velocity is 3.18 m/s, and with an assumed right atrial pressure of 3 mmHg, the estimated right ventricular systolic pressure is 43.4 mmHg. Left Atrium: Left atrial size was mildly dilated. Right Atrium: Right atrial size was normal in size. Pericardium: A small pericardial effusion is present. Mitral Valve: The mitral valve is normal in structure. No evidence of mitral valve regurgitation. No evidence of mitral valve stenosis. Tricuspid Valve: The tricuspid valve is normal in structure. Tricuspid valve regurgitation is mild . No evidence of tricuspid stenosis. Aortic Valve: The aortic valve is tricuspid. Aortic valve regurgitation is not visualized. Aortic valve sclerosis is present, with no evidence of aortic valve stenosis. Pulmonic Valve: The pulmonic valve was normal in structure. Pulmonic valve regurgitation is not visualized. No evidence of pulmonic stenosis. Aorta: The aortic root is normal in size and structure. Venous: The inferior vena cava is normal in size with greater than 50% respiratory variability, suggesting right atrial pressure of 3 mmHg. IAS/Shunts: No atrial level shunt detected by color flow Doppler.  LEFT VENTRICLE PLAX 2D LVIDd:         4.30 cm      Diastology LVIDs:         2.60 cm      LV e' medial:    6.64 cm/s LV PW:         0.90 cm      LV E/e' medial:  9.4 LV IVS:        0.80 cm      LV e' lateral:   9.03 cm/s LVOT diam:     2.10 cm      LV E/e' lateral: 6.9 LVOT Area:     3.46 cm  LV Volumes (MOD) LV vol d, MOD A2C: 102.0 ml LV vol d, MOD A4C: 102.0 ml LV vol s, MOD A2C: 25.7 ml LV vol s, MOD A4C: 25.9 ml LV SV MOD A2C:     76.3 ml LV SV MOD A4C:     102.0 ml LV SV MOD BP:      77.3 ml RIGHT VENTRICLE RV Basal diam:  3.40 cm RV Mid diam:    3.00 cm RV S prime:     10.40 cm/s LEFT ATRIUM  Index LA diam:        3.70 cm 1.68 cm/m LA Vol (A2C):   70.3 ml 31.89 ml/m LA Vol (A4C):   85.3 ml 38.69 ml/m LA Biplane Vol: 80.7 ml 36.61  ml/m   AORTA Ao Asc diam: 3.00 cm MITRAL VALVE               TRICUSPID VALVE MV Area (PHT): 3.44 cm    TR Peak grad:   40.4 mmHg MV E velocity: 62.60 cm/s  TR Vmax:        318.00 cm/s MV A velocity: 98.90 cm/s MV E/A ratio:  0.63        SHUNTS                            Systemic Diam: 2.10 cm Redell Shallow MD Electronically signed by Redell Shallow MD Signature Date/Time: 06/28/2024/1:34:05 PM    Final    DG Knee Right Port Result Date: 06/27/2024 CLINICAL DATA:  [id number] S/P total knee replacement, right [id number] EXAM: PORTABLE RIGHT KNEE - 1-2 VIEW COMPARISON:  05/18/2023 FINDINGS: Well-aligned knee arthroplasty without acute fracture.No dislocation. Subcutaneous edema and gas present, an expected finding at this stage in the postoperative recovery. No radiopaque foreign body or retained surgical instrument. IMPRESSION: Well-aligned knee arthroplasty without acute fracture or dislocation.No radiopaque foreign body or retained surgical instrument. Electronically Signed   By: Rogelia Myers M.D.   On: 06/27/2024 18:04    Today's  total administered Morphine  Milligram Equivalents: 0 Yesterday's total administered Morphine  Milligram Equivalents: 90  Assessment/Plan: Principal Problem:   New onset atrial flutter (HCC) Active Problems:   Primary hypertension   Obesity (BMI 30-39.9)   Multiple myeloma without remission (HCC)   S/P bone marrow transplant (HCC)   Peripheral neuropathy due to chemotherapy   Deficiency anemia   History of pulmonary embolus (PE)   Essential tremor   (HFpEF) heart failure with preserved ejection fraction (HCC)   Chronic midline low back pain without sciatica   Second degree AV block, Mobitz type I   Chronic diastolic CHF (congestive heart failure) (HCC)  Right knee osteoarthritis 2 Days Post-Op s/p Procedure(s): ARTHROPLASTY, KNEE, TOTAL  Weightbearing: WBAT RLE Insicional and dressing care: Reinforce dressings as needed VTE prophylaxis: Hbg stable this morning.  Have now increased to Xarelto  20 mg every day per cardiology.  Follow - up plan: 2 weeks Dispo: pending passing stair training today and any further cardiac work up needed today   Contact information:   Army Daring, PA-C Weekdays 8-5  After hours and holidays please check [email or web address] for group call information for [facility]  Army MARLA Daring 06/29/2024, 5:57 AM

## 2024-06-29 NOTE — Telephone Encounter (Signed)
 Patient Product/process development scientist completed.    The patient is insured through [employer]. Patient has Medicare and is not eligible for a copay card, but may be able to apply for patient assistance or Medicare RX Payment Plan (Patient Must reach out to their plan, if eligible for payment plan), if available.    Ran test claim for Xarelto  20 mg and the current 30 day co-pay is $0.00.   This test claim was processed through [facility]- copay amounts may vary at other pharmacies due to pharmacy/plan contracts, or as the patient moves through the different stages of their insurance plan.     Reyes Sharps, CPHT Pharmacy Technician III Certified Patient Advocate [facility] Patient Advocate Team Direct Number: [phone]  Fax: [phone]

## 2024-06-30 ENCOUNTER — Telehealth: Payer: Self-pay

## 2024-06-30 ENCOUNTER — Other Ambulatory Visit: Payer: Self-pay

## 2024-06-30 DIAGNOSIS — M1711 Unilateral primary osteoarthritis, right knee: Secondary | ICD-10-CM | POA: Diagnosis not present

## 2024-06-30 MED ORDER — POMALIDOMIDE 3 MG PO CAPS: 3.0000 mg | ORAL_CAPSULE | Freq: Every day | ORAL | 0 refills | Status: DC

## 2024-06-30 NOTE — Transitions of Care (Post Inpatient/ED Visit) (Signed)
   06/30/2024  Name: Jesse Mcclure. MRN: [id number] DOB: 02/19/1951  Today's TOC FU Call Status: Today's TOC FU Call Status:: Unsuccessful Call (1st Attempt) Unsuccessful Call (1st Attempt) Date: 06/30/24  Attempted to reach the patient regarding the most recent Inpatient/ED visit.  Follow Up Plan: Additional outreach attempts will be made to reach the patient to complete the Transitions of Care (Post Inpatient/ED visit) call.   Medford Balboa, BSN, RN [place]  VBCI - [employer] [facility] RN Care Manager [phone]

## 2024-07-02 ENCOUNTER — Telehealth: Payer: Self-pay | Admitting: Physician Assistant

## 2024-07-02 NOTE — Telephone Encounter (Signed)
 Call received from [employer]:  Live monitor noted  First documentation of Afib 69 bpm for 90 sec at 1118 today.   Chart reviewed, he is on OAC.

## 2024-07-03 ENCOUNTER — Telehealth: Payer: Self-pay

## 2024-07-03 DIAGNOSIS — I441 Atrioventricular block, second degree: Secondary | ICD-10-CM | POA: Diagnosis not present

## 2024-07-03 NOTE — Telephone Encounter (Signed)
 He called and left a message to cancel appts tomorrow. He had knee replacement last week.  Called him back. Appts canceled for tomorrow. Offered to reschedule appts and he declined. He will call the office back to reschedule.

## 2024-07-03 NOTE — Transitions of Care (Post Inpatient/ED Visit) (Signed)
   07/03/2024  Name: Jesse Mcclure. MRN: [id number] DOB: 1951-09-30  Today's TOC FU Call Status: Today's TOC FU Call Status:: Unsuccessful Call (2nd Attempt) Unsuccessful Call (1st Attempt) Date: 06/30/24 Unsuccessful Call (2nd Attempt) Date: 07/03/24  Attempted to reach the patient regarding the most recent Inpatient/ED visit.  Follow Up Plan: Additional outreach attempts will be made to reach the patient to complete the Transitions of Care (Post Inpatient/ED visit) call.  Medford Balboa, BSN, RN [place]  VBCI - [employer] [facility] RN Care Manager [phone]

## 2024-07-04 ENCOUNTER — Inpatient Hospital Stay

## 2024-07-04 ENCOUNTER — Inpatient Hospital Stay: Admitting: Hematology and Oncology

## 2024-07-04 ENCOUNTER — Telehealth: Payer: Self-pay

## 2024-07-04 NOTE — Transitions of Care (Post Inpatient/ED Visit) (Signed)
   07/04/2024  Name: Jesse Mcclure. MRN: [id number] DOB: Aug 07, 1951  Today's TOC FU Call Status: Today's TOC FU Call Status:: Unsuccessful Call (3rd Attempt) Unsuccessful Call (1st Attempt) Date: 06/30/24 Unsuccessful Call (2nd Attempt) Date: 07/03/24 Unsuccessful Call (3rd Attempt) Date: 07/04/24  Attempted to reach the patient regarding the most recent Inpatient/ED visit.  Follow Up Plan: No further outreach attempts will be made at this time. We have been unable to contact the patient.  Medford Balboa, BSN, RN [place]  VBCI - [employer] [facility] RN Care Manager [phone]

## 2024-07-05 DIAGNOSIS — M170 Bilateral primary osteoarthritis of knee: Secondary | ICD-10-CM | POA: Diagnosis not present

## 2024-07-06 DIAGNOSIS — M1711 Unilateral primary osteoarthritis, right knee: Secondary | ICD-10-CM | POA: Diagnosis not present

## 2024-07-07 ENCOUNTER — Other Ambulatory Visit: Payer: Self-pay | Admitting: Hematology and Oncology

## 2024-07-07 ENCOUNTER — Other Ambulatory Visit (HOSPITAL_COMMUNITY): Payer: Self-pay

## 2024-07-07 ENCOUNTER — Telehealth: Payer: Self-pay

## 2024-07-07 DIAGNOSIS — C9000 Multiple myeloma not having achieved remission: Secondary | ICD-10-CM

## 2024-07-07 MED ORDER — OXYCODONE HCL 5 MG PO TABS
5.0000 mg | ORAL_TABLET | ORAL | 0 refills | Status: DC | PRN
  Filled 2024-07-07: qty 90, 15d supply, fill #0

## 2024-07-07 NOTE — Telephone Encounter (Signed)
 He called and left a message requesting oxycodone  refill to the pharmacy please.

## 2024-07-07 NOTE — Telephone Encounter (Signed)
 Called and given below message. He verbalized understanding. He has already resumed Xarelto . He resumed Pomalyst  7 days after surgery but will stop and wait until after seen in the office on 10/9. He is aware of appts.

## 2024-07-07 NOTE — Telephone Encounter (Signed)
 Someone just gave him 10 mg oxycodone  I can only refill his baseline 5 mg dose, sent to [facility] Also he needs to resume his Xarelto  and see me next week to recheck his blood before we resume pomalyst 

## 2024-07-07 NOTE — Progress Notes (Signed)
 Cardiology Office Note   Date:  07/10/2024  ID:  Jesse JINNY Percy Mickey., DOB Feb 22, 1951, MRN [id number] PCP: [name], MD  [facility] Providers Cardiologist:  Wilbert Bihari, MD Cardiology APP:  Lelon Glendia DASEN, PA-C  Electrophysiologist:  Donnice DELENA Primus, MD   History of Present Illness Jesse Hellberg. is a 73 y.o. adult with AFL, Mobitz I 1' AVB, HFpEF, and HTN who presents for the evaluation of AF/AFL.  Jesse Mcclure is a 73 year old male with atrial flutter who presents for evaluation of heart rhythm issues discovered during knee surgery.  He underwent a complete knee replacement on June 27, 2024. During the preoperative evaluation, an EKG revealed atrial flutter, which was asymptomatic at the time. A cardiologist was consulted, and the surgery proceeded as planned. Postoperatively, he has been recovering with the use of a walker and is undergoing physical therapy. He notes that the recovery from this knee surgery has been more challenging than his previous left knee replacement in 2022.  Regarding his heart rhythm issues, he was found to have atrial flutter during the preoperative EKG. He was asymptomatic and unaware of the condition prior to this. He is currently wearing a Zio heart monitor to track his heart rhythm and is supposed to return it after 14 days. He does not have a personal device to monitor his heart rhythm at home. No symptoms such as palpitations or shortness of breath have been noted.  He has a history of multiple myeloma and is currently being treated with oral chemotherapy medications, Pomalyst  and Ninlaro . He has no history of smoking and has lost his taste for alcohol  since starting chemotherapy. He lives in [place] with his male partner and has two sisters living locally. Both of his parents are deceased.   ROS: none   Studies Reviewed  ECG review 06/29/24: AF/SVR 53, QRS 86, QT/c 428/401 06/28/24: NSR 87, QRS 82, QT/c  392/471, Mobitz I 2' AVB 06/27/24: AFL/VR 60, QRS 64, QT/c 292/292  TTE Result date: 06/28/24  1. Left ventricular ejection fraction, by estimation, is 70 to 75%. The  left ventricle has hyperdynamic function. The left ventricle has no  regional wall motion abnormalities. Left ventricular diastolic parameters  are indeterminate.   2. Right ventricular systolic function is normal. The right ventricular  size is normal. There is mildly elevated pulmonary artery systolic  pressure.   3. Left atrial size was mildly dilated.   4. A small pericardial effusion is present.   5. The mitral valve is normal in structure. No evidence of mitral valve  regurgitation. No evidence of mitral stenosis.   6. The aortic valve is tricuspid. Aortic valve regurgitation is not  visualized. Aortic valve sclerosis is present, with no evidence of aortic  valve stenosis.   7. The inferior vena cava is normal in size with greater than 50%  respiratory variability, suggesting right atrial pressure of 3 mmHg.   Risk Assessment/Calculations  CHA2DS2-VASc Score = 3  This indicates a 3.2% annual risk of stroke. The patient's score is based upon: CHF History: 1 HTN History: 1 Diabetes History: 0 Stroke History: 0 Vascular Disease History: 0 Age Score: 1 Gender Score: 0   Physical Exam VS:  BP 114/66   Pulse 66   Ht 5' 9 (1.753 m)   SpO2 95%   BMI 34.41 kg/m       Wt Readings from Last 3 Encounters:  06/27/24 233 lb (105.7 kg)  06/15/24 233 lb (105.7 kg)  05/09/24 232 lb 9.6 oz (105.5 kg)    GEN: Well nourished, well developed in no acute distress NECK: No JVD; No carotid bruits CARDIAC: RRR, no murmurs, rubs, gallops RESPIRATORY:  Clear to auscultation without rales, wheezing or rhonchi  ABDOMEN: Soft, non-tender, non-distended EXTREMITIES:  No edema; No deformity   ASSESSMENT AND PLAN Jesse Leamer. is a 73 y.o. adult with AFL, Mobitz I 1' AVB, HFpEF, and HTN who presents for the  evaluation of AF/AFL.  Paroxysmal AFL Paroxysmal AF Paroxysmal atrial fibrillation and typical atrial flutter identified during preoperative EKG for knee arthroplasty. Atrial flutter characterized by counterclockwise rhythm in the right atrium, with a heart rate of 300 bpm in the top chamber, but normal ventricular rate due to AV node conduction delay. Mild left atrial enlargement raises concern for potential progression to atrial fibrillation. He is currently asymptomatic and in normal sinus rhythm, with intermittent arrhythmia episodes. Discussed potential recurrence of atrial flutter and development of atrial fibrillation over time. Explained that atrial flutter is less responsive to medication compared to atrial fibrillation, and that ablation could offer a 90% cure rate for atrial flutter, but not for atrial fibrillation. Discussed risks of ablation, including bleeding, heart wall damage, and stroke, though these are rare. He prefers a watchful waiting approach with monitoring, given current asymptomatic status and recent surgery. - Order Kardia Mobile for home monitoring of heart rhythm. - Instruct to monitor heart rhythm once or twice a week using Cardia Mobile. - Review Zio monitor results once available to assess frequency and duration of arrhythmia. - Consider ablation if arrhythmia becomes more frequent or symptomatic. - Continue anticoagulation therapy to prevent stroke.   Dispo: RTC 3 months   A total of 60 minutes was spent preparing for the patient, reviewing history, performing exam, document encounter, coordinating care and counseling the patient. 45 minutes was spent with direct patient care.    Signed, Donnice DELENA Primus, MD

## 2024-07-10 ENCOUNTER — Encounter: Payer: Self-pay | Admitting: Student in an Organized Health Care Education/Training Program

## 2024-07-10 ENCOUNTER — Ambulatory Visit
Attending: Student in an Organized Health Care Education/Training Program | Admitting: Student in an Organized Health Care Education/Training Program

## 2024-07-10 VITALS — BP 114/66 | HR 66 | Ht 69.0 in

## 2024-07-10 DIAGNOSIS — I441 Atrioventricular block, second degree: Secondary | ICD-10-CM | POA: Diagnosis not present

## 2024-07-10 DIAGNOSIS — I4892 Unspecified atrial flutter: Secondary | ICD-10-CM | POA: Diagnosis not present

## 2024-07-10 DIAGNOSIS — M1711 Unilateral primary osteoarthritis, right knee: Secondary | ICD-10-CM | POA: Diagnosis not present

## 2024-07-10 DIAGNOSIS — I5032 Chronic diastolic (congestive) heart failure: Secondary | ICD-10-CM | POA: Insufficient documentation

## 2024-07-10 NOTE — Patient Instructions (Signed)

## 2024-07-11 ENCOUNTER — Telehealth: Payer: Self-pay | Admitting: Cardiology

## 2024-07-11 NOTE — Assessment & Plan Note (Signed)
 He has recurrent multiple myeloma, IgG kappa subtype, standard risk, well-maintained on current treatment with pomalidomide  and Ninlaro .  He is tapered off dexamethasone  Repeat myeloma panel is pending, from previous visit, stable He remains on Xarelto  for history of recurrent clots as well as DVT prophylaxis He will continue acyclovir  He is on calcium  and vitamin D  He received Zometa  in May, next dose will be in November I will continue treatment without changes

## 2024-07-11 NOTE — Telephone Encounter (Signed)
   Cardiac Monitor Alert  Date of alert:  07/11/2024   Patient Name: Jesse Mcclure.  DOB: Jan 23, 1951  MRN: [id number]   [facility] Cardiologist: Wilbert Bihari, MD  [facility] EP:  Donnice DELENA Primus, MD    Monitor Information: Long Term Monitor-Live Telemetry [ZioAT]  Reason:  Atrial Flutter and Heart Block Ordering provider:  Jon Hails   Alert Atrial Fibrillation/Flutter This is the 2nd alert for this rhythm.  The patient has a hx of Atrial Fibrillation/Flutter.    Anticoagulation medication as of 07/11/2024           rivaroxaban  (XARELTO ) 20 MG TABS tablet Take 1 tablet (20 mg total) by mouth daily with supper.       Next Cardiology Appointment   Date:  10/21/2023  Provider:  Dr Primus  The patient was contacted today.  He is asymptomatic. Arrhythmia, symptoms and history reviewed with Dr Court.  Plan:  Continue monitoring     Aldona FORBES Marina, RN  07/11/2024 9:43 AM

## 2024-07-11 NOTE — Telephone Encounter (Signed)
 Caller [facility]) is reporting abnormal EKG results for patient.

## 2024-07-11 NOTE — Assessment & Plan Note (Signed)
I checked his ferritin a year ago I will recheck ferritin again next month He will get phlebotomy if his ferritin level is greater than 250

## 2024-07-12 ENCOUNTER — Ambulatory Visit (HOSPITAL_COMMUNITY): Admitting: Internal Medicine

## 2024-07-13 ENCOUNTER — Encounter: Payer: Self-pay | Admitting: Hematology and Oncology

## 2024-07-13 ENCOUNTER — Other Ambulatory Visit (HOSPITAL_COMMUNITY): Payer: Self-pay

## 2024-07-13 ENCOUNTER — Inpatient Hospital Stay

## 2024-07-13 ENCOUNTER — Inpatient Hospital Stay: Attending: Hematology and Oncology | Admitting: Hematology and Oncology

## 2024-07-13 VITALS — BP 137/74 | HR 58 | Temp 99.9°F | Resp 18 | Ht 69.0 in | Wt 240.8 lb

## 2024-07-13 DIAGNOSIS — M1711 Unilateral primary osteoarthritis, right knee: Secondary | ICD-10-CM | POA: Diagnosis not present

## 2024-07-13 DIAGNOSIS — C9000 Multiple myeloma not having achieved remission: Secondary | ICD-10-CM | POA: Diagnosis not present

## 2024-07-13 LAB — CBC WITH DIFFERENTIAL/PLATELET
Abs Immature Granulocytes: 0.01 K/uL (ref 0.00–0.07)
Basophils Absolute: 0 K/uL (ref 0.0–0.1)
Basophils Relative: 0 %
Eosinophils Absolute: 0.2 K/uL (ref 0.0–0.5)
Eosinophils Relative: 3 %
HCT: 39.1 % (ref 39.0–52.0)
Hemoglobin: 13.2 g/dL (ref 13.0–17.0)
Immature Granulocytes: 0 %
Lymphocytes Relative: 17 %
Lymphs Abs: 1 K/uL (ref 0.7–4.0)
MCH: 35.1 pg — ABNORMAL HIGH (ref 26.0–34.0)
MCHC: 33.8 g/dL (ref 30.0–36.0)
MCV: 104 fL — ABNORMAL HIGH (ref 80.0–100.0)
Monocytes Absolute: 0.7 K/uL (ref 0.1–1.0)
Monocytes Relative: 11 %
Neutro Abs: 4.3 K/uL (ref 1.7–7.7)
Neutrophils Relative %: 69 %
Platelets: 217 K/uL (ref 150–400)
RBC: 3.76 MIL/uL — ABNORMAL LOW (ref 4.22–5.81)
RDW: 16 % — ABNORMAL HIGH (ref 11.5–15.5)
WBC: 6.2 K/uL (ref 4.0–10.5)
nRBC: 0 % (ref 0.0–0.2)

## 2024-07-13 LAB — COMPREHENSIVE METABOLIC PANEL WITH GFR
ALT: 18 U/L (ref 0–44)
AST: 17 U/L (ref 15–41)
Albumin: 3.5 g/dL (ref 3.5–5.0)
Alkaline Phosphatase: 63 U/L (ref 38–126)
Anion gap: 6 (ref 5–15)
BUN: 20 mg/dL (ref 8–23)
CO2: 29 mmol/L (ref 22–32)
Calcium: 8.9 mg/dL (ref 8.9–10.3)
Chloride: 106 mmol/L (ref 98–111)
Creatinine, Ser: 0.82 mg/dL (ref 0.61–1.24)
GFR, Estimated: >60 mL/min (ref >60)
Glucose, Bld: 118 mg/dL — ABNORMAL HIGH (ref 70–99)
Potassium: 3.9 mmol/L (ref 3.5–5.1)
Sodium: 141 mmol/L (ref 135–145)
Total Bilirubin: 1.2 mg/dL (ref 0.0–1.2)
Total Protein: 6.8 g/dL (ref 6.5–8.1)

## 2024-07-13 MED ORDER — CEPHALEXIN 500 MG PO CAPS
ORAL_CAPSULE | ORAL | 0 refills | Status: DC
  Filled 2024-07-13: qty 28, 7d supply, fill #0

## 2024-07-13 NOTE — Progress Notes (Signed)
 [facility] OFFICE PROGRESS NOTE  Patient Care Team: [name], MD as PCP - General (Family Medicine) Shlomo Wilbert SAUNDERS, MD as PCP - Cardiology (Cardiology) Almetta Donnice LABOR, MD as PCP - Electrophysiology (Cardiology) Lonn Hicks, MD as Consulting Physician (Hematology and Oncology) Lelon Glendia ONEIDA DEVONNA as Physician Assistant (Cardiology)  Assessment & Plan Multiple myeloma without remission [facility]) He has recurrent multiple myeloma, IgG kappa subtype, standard risk, well-maintained on current treatment with pomalidomide  and Ninlaro .  He is tapered off dexamethasone   His recent treatment of combination of Pomalyst , Ninlaro  was placed on hold due to recent knee surgery He appears to be recovering well with no wound healing issue We will resume treatment today Repeat myeloma panel is pending, from previous visit, stable He remains on Xarelto  for history of recurrent clots as well as DVT prophylaxis He will continue acyclovir  He is on calcium  and vitamin D  He received Zometa  in September, next dose will be in March of next year I will continue treatment without changes Hemochromatosis, hereditary I checked his ferritin a year ago I will recheck ferritin again next month He will get phlebotomy if his ferritin level is greater than 250 Primary osteoarthritis of right knee He has significant leg swelling due to recent surgery and is undergoing physical therapy The patient is aware he needs to elevate his legs as much as possible when he is immobile He is still taking some pain medicine for this  Orders Placed This Encounter  Procedures   Ferritin    Standing Status:   Future    Expiration Date:   07/11/2025     Hicks Lonn, MD  INTERVAL HISTORY: he returns for treatment follow-up His recent treatment was placed on hold due to surgery He is slowly getting better Range of motion is improving He still having a lot of leg swelling on the right side He resumed  taking Xarelto  within a few days after surgery and denies bleeding complications He denies recent infection  PHYSICAL EXAMINATION: ECOG PERFORMANCE STATUS: 2 - Symptomatic, <50% confined to bed  No results found for: CAN125    Latest Ref Rng & Units 07/13/2024    8:14 AM 06/29/2024    3:38 AM 06/28/2024    3:45 AM  CBC  WBC 4.0 - 10.5 K/uL 6.2  7.7  6.8   Hemoglobin 13.0 - 17.0 g/dL 86.7  86.6  85.8   Hematocrit 39.0 - 52.0 % 39.1  41.0  45.0   Platelets 150 - 400 K/uL 217  169  188       Chemistry      Component Value Date/Time   NA 141 07/13/2024 0814   NA 139 06/01/2023 1115   NA 139 04/05/2014 0849   K 3.9 07/13/2024 0814   K 4.4 04/05/2014 0849   CL 106 07/13/2024 0814   CO2 29 07/13/2024 0814   CO2 23 04/05/2014 0849   BUN 20 07/13/2024 0814   BUN 26 06/01/2023 1115   BUN 16.8 04/05/2014 0849   CREATININE 0.82 07/13/2024 0814   CREATININE 0.80 10/29/2022 1046   CREATININE 0.91 07/29/2017 1004   CREATININE 0.9 04/05/2014 0849   GLU 10 04/10/2014 0000      Component Value Date/Time   CALCIUM  8.9 07/13/2024 0814   CALCIUM  8.7 04/05/2014 0849   ALKPHOS 63 07/13/2024 0814   ALKPHOS 75 04/05/2014 0849   AST 17 07/13/2024 0814   AST 24 10/29/2022 1046   AST 26 04/05/2014 0849  ALT 18 07/13/2024 0814   ALT 31 10/29/2022 1046   ALT 30 04/05/2014 0849   BILITOT 1.2 07/13/2024 0814   BILITOT 0.9 06/01/2023 1115   BILITOT 0.9 10/29/2022 1046   BILITOT 0.71 04/05/2014 0849       Vitals:   07/13/24 0837  BP: 137/74  Pulse: (!) 58  Resp: 18  Temp: 99.9 F (37.7 C)  SpO2: 96%   Filed Weights   07/13/24 0837  Weight: 240 lb 12.8 oz (109.2 kg)   Other relevant data reviewed during this visit included CBC, CMP

## 2024-07-13 NOTE — Assessment & Plan Note (Addendum)
 He has significant leg swelling due to recent surgery and is undergoing physical therapy The patient is aware he needs to elevate his legs as much as possible when he is immobile He is still taking some pain medicine for this

## 2024-07-14 ENCOUNTER — Ambulatory Visit (INDEPENDENT_AMBULATORY_CARE_PROVIDER_SITE_OTHER): Admitting: Podiatry

## 2024-07-14 ENCOUNTER — Encounter: Payer: Self-pay | Admitting: Podiatry

## 2024-07-14 ENCOUNTER — Other Ambulatory Visit (HOSPITAL_COMMUNITY): Payer: Self-pay

## 2024-07-14 ENCOUNTER — Encounter: Payer: Self-pay | Admitting: Hematology and Oncology

## 2024-07-14 DIAGNOSIS — L089 Local infection of the skin and subcutaneous tissue, unspecified: Secondary | ICD-10-CM

## 2024-07-14 DIAGNOSIS — S90821A Blister (nonthermal), right foot, initial encounter: Secondary | ICD-10-CM | POA: Diagnosis not present

## 2024-07-14 LAB — KAPPA/LAMBDA LIGHT CHAINS
Kappa free light chain: 59.6 mg/L — ABNORMAL HIGH (ref 3.3–19.4)
Kappa, lambda light chain ratio: 3.51 — ABNORMAL HIGH (ref 0.26–1.65)
Lambda free light chains: 17 mg/L (ref 5.7–26.3)

## 2024-07-14 MED ORDER — MUPIROCIN 2 % EX OINT
1.0000 | TOPICAL_OINTMENT | Freq: Two times a day (BID) | CUTANEOUS | 2 refills | Status: AC
  Filled 2024-07-14: qty 22, 11d supply, fill #0

## 2024-07-14 NOTE — Progress Notes (Signed)
 Subjective: Chief Complaint  Patient presents with   Wound Check    Right foot dorsal blister popped 2 weeks ago. Treating wound with neosporin and dressing changes. Non diabetic Started Keflex  today and taking Xarelto  blood thinner. Pt. Cut R3 trying to trim toenails.   73 year old male presents with a new complaint of pain to a blister on the top of his right foot.  He recently underwent knee replacement and since then he developed swelling and blistering to his foot.  He was seen by his orthopedic surgeon at [facility] and was recently started cephalexin  the other day.  He has not seen any drainage or pus.  No injuries.  Does not report any fevers or chills.  Objective: AAO x3, NAD DP/PT pulses palpable bilaterally, CRT less than 3 seconds It appears if there is a draining blister on the dorsal aspect the right foot is pictured below there is localized edema erythema mostly to the second toe and dorsal midfoot.  There is currently no drainage or pus.  There is no fluctuation or crepitation.  There is no ascending cellulitis.  No malodor. No pain with calf compression, swelling, warmth, erythema    Assessment: Blister with localized cellulitis right foot  Plan: -All treatment options discussed with the patient including all alternatives, risks, complications.  -Recommend continue with cephalexin  500 mg 4 times a day as prescribed.  Prescribing mupirocin ointment to apply topically.  Discussed washing foot with soap and water , dry thoroughly apply a similar bandage. -Monitor for any clinical signs or symptoms of infection and directed to call the office immediately should any occur or go to the ER. -Patient encouraged to call the office with any questions, concerns, change in symptoms.   Donnice JONELLE Fees DPM

## 2024-07-14 NOTE — Patient Instructions (Signed)
 Wash foot with soap and water , dry well.  Apply a small amount of mupirocin ointment and apply a dressing.  Continue cephalexin .  Monitor for any signs/symptoms of infection. Call the office immediately if any occur or go directly to the emergency room. Call with any questions/concerns.

## 2024-07-17 ENCOUNTER — Telehealth: Payer: Self-pay

## 2024-07-17 ENCOUNTER — Other Ambulatory Visit: Payer: Self-pay | Admitting: Hematology and Oncology

## 2024-07-17 DIAGNOSIS — M1711 Unilateral primary osteoarthritis, right knee: Secondary | ICD-10-CM | POA: Diagnosis not present

## 2024-07-17 NOTE — Telephone Encounter (Signed)
 Returned his call regarding request for oxycodone  refill. Last fill 10/3, per Dr. Lonn he is too early and instructed to call back Friday for refill. He verbalized understanding.

## 2024-07-18 LAB — MULTIPLE MYELOMA PANEL, SERUM
Albumin SerPl Elph-Mcnc: 3.1 g/dL (ref 2.9–4.4)
Albumin/Glob SerPl: 1.1 (ref 0.7–1.7)
Alpha 1: 0.3 g/dL (ref 0.0–0.4)
Alpha2 Glob SerPl Elph-Mcnc: 0.7 g/dL (ref 0.4–1.0)
B-Globulin SerPl Elph-Mcnc: 0.8 g/dL (ref 0.7–1.3)
Gamma Glob SerPl Elph-Mcnc: 1.2 g/dL (ref 0.4–1.8)
Globulin, Total: 3 g/dL (ref 2.2–3.9)
IgA: 114 mg/dL (ref 61–437)
IgG (Immunoglobin G), Serum: 1543 mg/dL (ref 603–1613)
IgM (Immunoglobulin M), Srm: 43 mg/dL (ref 15–143)
M Protein SerPl Elph-Mcnc: 0.8 g/dL — ABNORMAL HIGH
Total Protein ELP: 6.1 g/dL (ref 6.0–8.5)

## 2024-07-21 ENCOUNTER — Other Ambulatory Visit (HOSPITAL_COMMUNITY): Payer: Self-pay

## 2024-07-21 ENCOUNTER — Other Ambulatory Visit: Payer: Self-pay | Admitting: Hematology and Oncology

## 2024-07-21 ENCOUNTER — Telehealth: Payer: Self-pay

## 2024-07-21 ENCOUNTER — Ambulatory Visit (INDEPENDENT_AMBULATORY_CARE_PROVIDER_SITE_OTHER): Admitting: Podiatry

## 2024-07-21 VITALS — Ht 69.0 in | Wt 240.8 lb

## 2024-07-21 DIAGNOSIS — L089 Local infection of the skin and subcutaneous tissue, unspecified: Secondary | ICD-10-CM

## 2024-07-21 DIAGNOSIS — S90821A Blister (nonthermal), right foot, initial encounter: Secondary | ICD-10-CM

## 2024-07-21 DIAGNOSIS — C9000 Multiple myeloma not having achieved remission: Secondary | ICD-10-CM

## 2024-07-21 MED ORDER — OXYCODONE HCL 5 MG PO TABS
5.0000 mg | ORAL_TABLET | ORAL | 0 refills | Status: DC | PRN
  Filled 2024-07-21: qty 90, 15d supply, fill #0

## 2024-07-21 NOTE — Telephone Encounter (Signed)
He called and left a message requesting oxycodone refill to pharmacy.

## 2024-07-21 NOTE — Progress Notes (Signed)
 Subjective: Chief Complaint  Patient presents with   Wound Check    Rm 12 Patient is here to f/u on blister on the dorsal aspect of the right foot. The wound has scabbed over with moderate drainage. Pt states changing dressing twice daily.    73 year old male presents with a new complaint of pain to a blister on the top of his right foot.  States he is doing better.  He has stopped the cephalexin  given GI issues.  Been washing with soap and water  and using mupirocin ointment daily, changing twice a day.  Currently denies any drainage or pus.  No fevers or chills.  No pain to the area.  No other concerns.    Objective: AAO x3, NAD DP/PT pulses palpable bilaterally, CRT less than 3 seconds On the dorsal aspect of the right foot with an area of the blister was that this is now dry and there is loose skin present and start to peel off but there is new, healthy skin present underneath this area.  Area of edema, erythema much improved compared to what it was last week.  There is no areas of fluctuation or crepitation but there is no malodor. No pain with calf compression, swelling, warmth, erythema   Assessment: Blister with localized cellulitis right foot-improving  Plan: -All treatment options discussed with the patient including all alternatives, risks, complications.  -I lightly debrided some of the loose tissue today with any complications.  Discussed continue washing with soap and water  daily, dry thoroughly and continue in toto, dressing changes daily.  Will hold off on further antibiotics as clinically looks much better no obvious signs of infection otherwise. -Monitor for any clinical signs or symptoms of infection and directed to call the office immediately should any occur or go to the ER.  Return in about 2 weeks (around 08/04/2024) for foot blister/infection .  Jesse Mcclure Fees DPM

## 2024-07-21 NOTE — Telephone Encounter (Signed)
Called and told Rx sent. He verbalized understanding.

## 2024-07-25 DIAGNOSIS — M1711 Unilateral primary osteoarthritis, right knee: Secondary | ICD-10-CM | POA: Diagnosis not present

## 2024-07-27 DIAGNOSIS — M1711 Unilateral primary osteoarthritis, right knee: Secondary | ICD-10-CM | POA: Diagnosis not present

## 2024-07-31 ENCOUNTER — Ambulatory Visit: Admitting: Student in an Organized Health Care Education/Training Program

## 2024-08-01 DIAGNOSIS — M1711 Unilateral primary osteoarthritis, right knee: Secondary | ICD-10-CM | POA: Diagnosis not present

## 2024-08-04 ENCOUNTER — Other Ambulatory Visit: Payer: Self-pay | Admitting: Family Medicine

## 2024-08-04 ENCOUNTER — Ambulatory Visit (INDEPENDENT_AMBULATORY_CARE_PROVIDER_SITE_OTHER): Admitting: Podiatry

## 2024-08-04 ENCOUNTER — Other Ambulatory Visit: Payer: Self-pay | Admitting: Hematology and Oncology

## 2024-08-04 ENCOUNTER — Other Ambulatory Visit (HOSPITAL_COMMUNITY): Payer: Self-pay

## 2024-08-04 ENCOUNTER — Other Ambulatory Visit: Payer: Self-pay

## 2024-08-04 ENCOUNTER — Telehealth: Payer: Self-pay

## 2024-08-04 DIAGNOSIS — S90821A Blister (nonthermal), right foot, initial encounter: Secondary | ICD-10-CM | POA: Diagnosis not present

## 2024-08-04 DIAGNOSIS — L089 Local infection of the skin and subcutaneous tissue, unspecified: Secondary | ICD-10-CM

## 2024-08-04 MED ORDER — RIVAROXABAN 10 MG PO TABS
10.0000 mg | ORAL_TABLET | Freq: Every day | ORAL | 1 refills | Status: DC
  Filled 2024-08-04: qty 90, 90d supply, fill #0

## 2024-08-04 MED ORDER — FUROSEMIDE 20 MG PO TABS
20.0000 mg | ORAL_TABLET | Freq: Every day | ORAL | 1 refills | Status: AC
  Filled 2024-08-04: qty 90, 90d supply, fill #0
  Filled 2024-11-03: qty 90, 90d supply, fill #1

## 2024-08-04 NOTE — Progress Notes (Signed)
 He called for renewal of his Lasix .  He has been on 40 mg.  Review of the record indicates that he did have CHF with preserved EF.  He has been doing fairly well and I think it is reasonable to try and drop him down to 20 mg and have him monitor his weight.  He is to follow-up with cardiology in January.

## 2024-08-04 NOTE — Telephone Encounter (Signed)
 Called and given below message to [place]. He verbalized understanding.

## 2024-08-04 NOTE — Telephone Encounter (Signed)
-----   Message from Almarie Bedford sent at 08/04/2024  9:21 AM EDT ----- I signed refill for xarelto  I disagree refilling lasix  due to risk of dehydration

## 2024-08-04 NOTE — Progress Notes (Signed)
 Subjective: No chief complaint on file.    73 year old male presents with a new complaint of pain to a blister on the top of his right foot.  States he is doing much better and the area is healed.  Does not see any drainage or pus.  No creasing swelling or redness.  No fevers or chills.  No other concerns.   Objective: AAO x3, NAD DP/PT pulses palpable bilaterally, CRT less than 3 seconds On dorsal aspect the right foot there is new, pink skin present.  There is no skin breakdown identified there is no blister formation.  There is no erythema otherwise there are no cellulitis.  No fluctuation or crepitation. No pain with calf compression, swelling, warmth, erythema  Assessment: Blister with localized cellulitis right foot-resolved  Plan: -All treatment options discussed with the patient including all alternatives, risks, complications.  -Skin is dry but the wound is healed.  Recommended moisturizer daily and monitor closely for any signs or symptoms of infection or reoccurrence of the wound.  Discussed compression over the residual swelling to help prevent reoccurrence.  Daily foot inspection.  Return if symptoms worsen or fail to improve.  Donnice JONELLE Fees DPM

## 2024-08-05 ENCOUNTER — Telehealth: Payer: Self-pay | Admitting: Student in an Organized Health Care Education/Training Program

## 2024-08-05 NOTE — Telephone Encounter (Signed)
 Spoke with Mr. Wandler about his recent monitor with 100% AF.  He does not have any rhythm awareness and his AF was detected incidentally prior to his knee surgery.  We discussed different options for management including rate versus rhythm control.  At his age I would preference maintaining normal sinus rhythm as he is still very active.  He only has mild LA dilation right now.  I offered to arrange for expedited cardioversion versus further discussion regarding rhythm management with antiarrhythmics versus ablation.  He would like to come back to clinic to discuss further with his partner.  His partner works as a clinical research associate so I provide some dates of clinic over the next few weeks and he will reach back out to schedule.  My next scheduled appoint with them is in January but I would like to get him back in normal rhythm before then.  Donnice DELENA Primus, MD [facility]  Cardiac Electrophysiology

## 2024-08-07 ENCOUNTER — Other Ambulatory Visit (HOSPITAL_COMMUNITY): Payer: Self-pay

## 2024-08-07 ENCOUNTER — Other Ambulatory Visit: Payer: Self-pay

## 2024-08-07 ENCOUNTER — Other Ambulatory Visit: Payer: Self-pay | Admitting: Physician Assistant

## 2024-08-07 ENCOUNTER — Telehealth: Payer: Self-pay

## 2024-08-07 DIAGNOSIS — C9000 Multiple myeloma not having achieved remission: Secondary | ICD-10-CM

## 2024-08-07 MED ORDER — OXYCODONE HCL 5 MG PO TABS
5.0000 mg | ORAL_TABLET | ORAL | 0 refills | Status: DC | PRN
  Filled 2024-08-07: qty 90, 15d supply, fill #0

## 2024-08-07 NOTE — Progress Notes (Signed)
 Refill for oxycodone  sent to pharmacy per patient's request. I have reviewed the PDMP during this encounter. Patient currently followed by Dr. Lonn. No changes to medications were made.

## 2024-08-07 NOTE — Telephone Encounter (Signed)
 Returned his call. He is requesting Oxycodone  refill to [facility]. He has 2 tablets left. Sent a message to PA covering med refills.

## 2024-08-07 NOTE — Telephone Encounter (Signed)
 Called and told Rx sent to [facility] pharmacy. He verbalized understanding.

## 2024-08-09 DIAGNOSIS — M1711 Unilateral primary osteoarthritis, right knee: Secondary | ICD-10-CM | POA: Diagnosis not present

## 2024-08-11 DIAGNOSIS — M1711 Unilateral primary osteoarthritis, right knee: Secondary | ICD-10-CM | POA: Diagnosis not present

## 2024-08-14 ENCOUNTER — Inpatient Hospital Stay (HOSPITAL_BASED_OUTPATIENT_CLINIC_OR_DEPARTMENT_OTHER): Admitting: Hematology and Oncology

## 2024-08-14 ENCOUNTER — Other Ambulatory Visit: Payer: Self-pay | Admitting: Hematology and Oncology

## 2024-08-14 ENCOUNTER — Encounter: Payer: Self-pay | Admitting: Hematology and Oncology

## 2024-08-14 ENCOUNTER — Telehealth: Payer: Self-pay

## 2024-08-14 ENCOUNTER — Inpatient Hospital Stay: Attending: Hematology and Oncology

## 2024-08-14 VITALS — BP 141/61 | HR 57 | Temp 97.9°F | Resp 18 | Wt 238.4 lb

## 2024-08-14 DIAGNOSIS — D701 Agranulocytosis secondary to cancer chemotherapy: Secondary | ICD-10-CM

## 2024-08-14 DIAGNOSIS — C9000 Multiple myeloma not having achieved remission: Secondary | ICD-10-CM

## 2024-08-14 DIAGNOSIS — T451X5A Adverse effect of antineoplastic and immunosuppressive drugs, initial encounter: Secondary | ICD-10-CM | POA: Diagnosis not present

## 2024-08-14 DIAGNOSIS — Z79899 Other long term (current) drug therapy: Secondary | ICD-10-CM | POA: Diagnosis not present

## 2024-08-14 LAB — CBC WITH DIFFERENTIAL/PLATELET
Abs Immature Granulocytes: 0.01 K/uL (ref 0.00–0.07)
Basophils Absolute: 0 K/uL (ref 0.0–0.1)
Basophils Relative: 1 %
Eosinophils Absolute: 0.1 K/uL (ref 0.0–0.5)
Eosinophils Relative: 2 %
HCT: 41.4 % (ref 39.0–52.0)
Hemoglobin: 13.8 g/dL (ref 13.0–17.0)
Immature Granulocytes: 0 %
Lymphocytes Relative: 47 %
Lymphs Abs: 1.5 K/uL (ref 0.7–4.0)
MCH: 35.4 pg — ABNORMAL HIGH (ref 26.0–34.0)
MCHC: 33.3 g/dL (ref 30.0–36.0)
MCV: 106.2 fL — ABNORMAL HIGH (ref 80.0–100.0)
Monocytes Absolute: 0.2 K/uL (ref 0.1–1.0)
Monocytes Relative: 7 %
Neutro Abs: 1.4 K/uL — ABNORMAL LOW (ref 1.7–7.7)
Neutrophils Relative %: 43 %
Platelets: 200 K/uL (ref 150–400)
RBC: 3.9 MIL/uL — ABNORMAL LOW (ref 4.22–5.81)
RDW: 16.2 % — ABNORMAL HIGH (ref 11.5–15.5)
WBC: 3.1 K/uL — ABNORMAL LOW (ref 4.0–10.5)
nRBC: 0 % (ref 0.0–0.2)

## 2024-08-14 LAB — COMPREHENSIVE METABOLIC PANEL WITH GFR
ALT: 33 U/L (ref 0–44)
AST: 22 U/L (ref 15–41)
Albumin: 3.6 g/dL (ref 3.5–5.0)
Alkaline Phosphatase: 62 U/L (ref 38–126)
Anion gap: 4 — ABNORMAL LOW (ref 5–15)
BUN: 16 mg/dL (ref 8–23)
CO2: 29 mmol/L (ref 22–32)
Calcium: 9.1 mg/dL (ref 8.9–10.3)
Chloride: 108 mmol/L (ref 98–111)
Creatinine, Ser: 0.86 mg/dL (ref 0.61–1.24)
GFR, Estimated: >60 mL/min (ref >60)
Glucose, Bld: 72 mg/dL (ref 70–99)
Potassium: 4.5 mmol/L (ref 3.5–5.1)
Sodium: 141 mmol/L (ref 135–145)
Total Bilirubin: 0.9 mg/dL (ref 0.0–1.2)
Total Protein: 6.7 g/dL (ref 6.5–8.1)

## 2024-08-14 LAB — FERRITIN: Ferritin: 447 ng/mL — ABNORMAL HIGH (ref 24–336)

## 2024-08-14 NOTE — Assessment & Plan Note (Addendum)
This is likely due to recent treatment. The patient denies recent history of fevers, cough, chills, diarrhea or dysuria. He is asymptomatic from the leukopenia. I will observe for now.  I will continue the chemotherapy at current dose without dosage adjustment.  If the leukopenia gets progressive worse in the future, I might have to delay his treatment or adjust the chemotherapy dose. 

## 2024-08-14 NOTE — Assessment & Plan Note (Addendum)
 He has recurrent multiple myeloma, IgG kappa subtype, standard risk, well-maintained on current treatment with pomalidomide  and Ninlaro .  He is tapered off dexamethasone   His recent treatment of combination of Pomalyst , Ninlaro  was placed on hold due to recent knee surgery He appears to be recovering well with no wound healing issue Recent myeloma panel show M protein is marginally elevated likely due to interruption of treatment We will continue treatment as prescribed He remains on Xarelto  for history of recurrent clots as well as DVT prophylaxis He will continue acyclovir  He is on calcium  and vitamin D  He received Zometa  in September, next dose will be in March of next year I will continue treatment without changes

## 2024-08-14 NOTE — Progress Notes (Signed)
 [facility] OFFICE PROGRESS NOTE  Patient Care Team: [name], MD as PCP - General (Family Medicine) Shlomo Wilbert SAUNDERS, MD as PCP - Cardiology (Cardiology) Almetta Donnice LABOR, MD as PCP - Electrophysiology (Cardiology) Lonn Hicks, MD as Consulting Physician (Hematology and Oncology) Lelon Glendia ONEIDA DEVONNA as Physician Assistant (Cardiology)  Assessment & Plan Multiple myeloma without remission [facility]) He has recurrent multiple myeloma, IgG kappa subtype, standard risk, well-maintained on current treatment with pomalidomide  and Ninlaro .  He is tapered off dexamethasone   His recent treatment of combination of Pomalyst , Ninlaro  was placed on hold due to recent knee surgery He appears to be recovering well with no wound healing issue Recent myeloma panel show M protein is marginally elevated likely due to interruption of treatment We will continue treatment as prescribed He remains on Xarelto  for history of recurrent clots as well as DVT prophylaxis He will continue acyclovir  He is on calcium  and vitamin D  He received Zometa  in September, next dose will be in March of next year I will continue treatment without changes Hemochromatosis, hereditary I checked his ferritin a year ago I am repeating ferritin level again today, result came back high I will order phlebotomy Leukopenia due to antineoplastic chemotherapy This is likely due to recent treatment. The patient denies recent history of fevers, cough, chills, diarrhea or dysuria. He is asymptomatic from the leukopenia. I will observe for now.  I will continue the chemotherapy at current dose without dosage adjustment.  If the leukopenia gets progressive worse in the future, I might have to delay his treatment or adjust the chemotherapy dose.   No orders of the defined types were placed in this encounter.    Hicks Lonn, MD  INTERVAL HISTORY: he returns for treatment follow-up for multiple myeloma Complications  related to previous cycle of chemotherapy included leukopenia, He is undergoing cardiology evaluation for possible ablation in the future He denies recent infection.  He is overdue for influenza vaccination He is still recovering from his knee surgery He has not seen his dentist in the past 6 months, next appointment due next month  PHYSICAL EXAMINATION: ECOG PERFORMANCE STATUS: 0 - Asymptomatic  Lab Results  Component Value Date   IGGSERUM 1,543 07/13/2024   TOTALPROTELP 6.1 07/13/2024   MPROTEIN 0.8 (H) 07/13/2024   IFE Comment (A) 07/13/2024   KPAFRELGTCHN 59.6 (H) 07/13/2024   LAMBDASER 17.0 07/13/2024   KAPLAMBRATIO 3.51 (H) 07/13/2024   KAPLAMBRATIO 2.90 (H) 05/02/2024   KAPLAMBRATIO 2.19 (H) 03/02/2024      Latest Ref Rng & Units 08/14/2024   11:00 AM 07/13/2024    8:14 AM 06/29/2024    3:38 AM  CBC  WBC 4.0 - 10.5 K/uL 3.1  6.2  7.7   Hemoglobin 13.0 - 17.0 g/dL 86.1  86.7  86.6   Hematocrit 39.0 - 52.0 % 41.4  39.1  41.0   Platelets 150 - 400 K/uL 200  217  169       Chemistry      Component Value Date/Time   NA 141 08/14/2024 1100   NA 139 06/01/2023 1115   NA 139 04/05/2014 0849   K 4.5 08/14/2024 1100   K 4.4 04/05/2014 0849   CL 108 08/14/2024 1100   CO2 29 08/14/2024 1100   CO2 23 04/05/2014 0849   BUN 16 08/14/2024 1100   BUN 26 06/01/2023 1115   BUN 16.8 04/05/2014 0849   CREATININE 0.86 08/14/2024 1100   CREATININE 0.80 10/29/2022 1046  CREATININE 0.91 07/29/2017 1004   CREATININE 0.9 04/05/2014 0849   GLU 10 04/10/2014 0000      Component Value Date/Time   CALCIUM  9.1 08/14/2024 1100   CALCIUM  8.7 04/05/2014 0849   ALKPHOS 62 08/14/2024 1100   ALKPHOS 75 04/05/2014 0849   AST 22 08/14/2024 1100   AST 24 10/29/2022 1046   AST 26 04/05/2014 0849   ALT 33 08/14/2024 1100   ALT 31 10/29/2022 1046   ALT 30 04/05/2014 0849   BILITOT 0.9 08/14/2024 1100   BILITOT 0.9 06/01/2023 1115   BILITOT 0.9 10/29/2022 1046   BILITOT 0.71 04/05/2014  0849       Vitals:   08/14/24 1120  BP: (!) 141/61  Pulse: (!) 57  Resp: 18  Temp: 97.9 F (36.6 C)  SpO2: 97%   Filed Weights   08/14/24 1120  Weight: 238 lb 6.4 oz (108.1 kg)   Other relevant data reviewed during this visit included CBC, CMP, recent myeloma panel

## 2024-08-14 NOTE — Assessment & Plan Note (Addendum)
 I checked his ferritin a year ago I am repeating ferritin level again today, result came back high I will order phlebotomy

## 2024-08-14 NOTE — Telephone Encounter (Signed)
 Called him per Dr. Lonn, Ferritin came back high and Phlebotomy is recommended. He verbalized understanding and agreeable. Sent scheduling message.

## 2024-08-14 NOTE — Telephone Encounter (Signed)
 Called and told him Zometa  is not due to March. Dr. Lonn canceled December infusion and will reschedule Zometa  at December appt. He verbalized understanding.

## 2024-08-15 ENCOUNTER — Encounter: Payer: Self-pay | Admitting: Hematology and Oncology

## 2024-08-15 ENCOUNTER — Other Ambulatory Visit (HOSPITAL_COMMUNITY): Payer: Self-pay

## 2024-08-15 LAB — KAPPA/LAMBDA LIGHT CHAINS
Kappa free light chain: 99.2 mg/L — ABNORMAL HIGH (ref 3.3–19.4)
Kappa, lambda light chain ratio: 4.47 — ABNORMAL HIGH (ref 0.26–1.65)
Lambda free light chains: 22.2 mg/L (ref 5.7–26.3)

## 2024-08-16 ENCOUNTER — Encounter: Payer: Self-pay | Admitting: Hematology and Oncology

## 2024-08-16 ENCOUNTER — Other Ambulatory Visit (HOSPITAL_COMMUNITY): Payer: Self-pay

## 2024-08-16 MED ORDER — FLUZONE HIGH-DOSE 0.5 ML IM SUSY
0.5000 mL | PREFILLED_SYRINGE | Freq: Once | INTRAMUSCULAR | 0 refills | Status: AC
  Filled 2024-08-16: qty 0.5, 1d supply, fill #0

## 2024-08-17 ENCOUNTER — Ambulatory Visit
Attending: Student in an Organized Health Care Education/Training Program | Admitting: Student in an Organized Health Care Education/Training Program

## 2024-08-17 ENCOUNTER — Encounter: Payer: Self-pay | Admitting: *Deleted

## 2024-08-17 ENCOUNTER — Other Ambulatory Visit (HOSPITAL_COMMUNITY): Payer: Self-pay

## 2024-08-17 ENCOUNTER — Encounter: Payer: Self-pay | Admitting: Student in an Organized Health Care Education/Training Program

## 2024-08-17 ENCOUNTER — Other Ambulatory Visit: Payer: Self-pay | Admitting: Hematology and Oncology

## 2024-08-17 VITALS — BP 157/71 | HR 53 | Ht 69.0 in

## 2024-08-17 DIAGNOSIS — I4892 Unspecified atrial flutter: Secondary | ICD-10-CM | POA: Insufficient documentation

## 2024-08-17 DIAGNOSIS — I480 Paroxysmal atrial fibrillation: Secondary | ICD-10-CM | POA: Diagnosis present

## 2024-08-17 DIAGNOSIS — Z01812 Encounter for preprocedural laboratory examination: Secondary | ICD-10-CM | POA: Insufficient documentation

## 2024-08-17 DIAGNOSIS — M1711 Unilateral primary osteoarthritis, right knee: Secondary | ICD-10-CM | POA: Diagnosis not present

## 2024-08-17 MED ORDER — AMIODARONE HCL 200 MG PO TABS
ORAL_TABLET | ORAL | 3 refills | Status: DC
  Filled 2024-08-17: qty 90, 48d supply, fill #0

## 2024-08-17 NOTE — Patient Instructions (Addendum)
 Medication Instructions:  Your physician has recommended you make the following change in your medication:  START Amiodarone  - take 2 tablets (400 mg total) TWICE a day for 2 weeks, then  - take 1 tablet (200 mg total) ONCE a day   *If you need a refill on your cardiac medications before your next appointment, please call your pharmacy*   Lab Work: Pre procedure labs today:  BMP & CBC  If you have a lab test that is abnormal and we need to change your treatment, we will call you to review the results -- otherwise no news is good news.    Testing/Procedures: Your physician has recommended that you have an ablation. Catheter ablation is a medical procedure used to treat some cardiac arrhythmias (irregular heartbeats). During catheter ablation, a long, thin, flexible tube is put into a blood vessel in your groin (upper thigh), or neck. This tube is called an ablation catheter. It is then guided to your heart through the blood vessel. Radio frequency waves destroy small areas of heart tissue where abnormal heartbeats may cause an arrhythmia to start. Please review the information below on ablation and after care.  Your ablation is scheduled for 09/12/24. Please arrive at [facility] at 7:30 am.  See instruction letter given to you today.   Follow-Up: At [facility], you and your health needs are our priority.  As part of our continuing mission to provide you with exceptional heart care, we have created designated Provider Care Teams.  These Care Teams include your primary Cardiologist (physician) and Advanced Practice Providers (APPs -  Physician Assistants and Nurse Practitioners) who all work together to provide you with the care you need, when you need it.  Your next appointment:   1 month(s) after your ablation  The format for your next appointment:   In Person  Provider:   AFib clinic   Thank you for choosing [facility]!!   [phone]    Other  Instructions   Cardiac Ablation Cardiac ablation is a procedure to destroy (ablate) some heart tissue that is sending bad signals. These bad signals cause problems in heart rhythm. The heart has many areas that make these signals. If there are problems in these areas, they can make the heart beat in a way that is not normal. Destroying some tissues can help make the heart rhythm normal. Tell your doctor about: Any allergies you have. All medicines you are taking. These include vitamins, herbs, eye drops, creams, and over-the-counter medicines. Any problems you or family members have had with medicines that make you fall asleep (anesthetics). Any blood disorders you have. Any surgeries you have had. Any medical conditions you have, such as kidney failure. Whether you are pregnant or may be pregnant. What are the risks? This is a safe procedure. But problems may occur, including: Infection. Bruising and bleeding. Bleeding into the chest. Stroke or blood clots. Damage to nearby areas of your body. Allergies to medicines or dyes. The need for a pacemaker if the normal system is damaged. Failure of the procedure to treat the problem. What happens before the procedure? Medicines Ask your doctor about: Changing or stopping your normal medicines. This is important. Taking aspirin and ibuprofen . Do not take these medicines unless your doctor tells you to take them. Taking other medicines, vitamins, herbs, and supplements. General instructions Follow instructions from your doctor about what you cannot eat or drink. Plan to have someone take you home from the hospital  or clinic. If you will be going home right after the procedure, plan to have someone with you for 24 hours. Ask your doctor what steps will be taken to prevent infection. What happens during the procedure?  An IV tube will be put into one of your veins. You will be given a medicine to help you relax. The skin on your neck or  groin will be numbed. A cut (incision) will be made in your neck or groin. A needle will be put through your cut and into a large vein. A tube (catheter) will be put into the needle. The tube will be moved to your heart. Dye may be put through the tube. This helps your doctor see your heart. Small devices (electrodes) on the tube will send out signals. A type of energy will be used to destroy some heart tissue. The tube will be taken out. Pressure will be held on your cut. This helps stop bleeding. A bandage will be put over your cut. The exact procedure may vary among doctors and hospitals. What happens after the procedure? You will be watched until you leave the hospital or clinic. This includes checking your heart rate, breathing rate, oxygen, and blood pressure. Your cut will be watched for bleeding. You will need to lie still for a few hours. Do not drive for 24 hours or as long as your doctor tells you. Summary Cardiac ablation is a procedure to destroy some heart tissue. This is done to treat heart rhythm problems. Tell your doctor about any medical conditions you may have. Tell him or her about all medicines you are taking to treat them. This is a safe procedure. But problems may occur. These include infection, bruising, bleeding, and damage to nearby areas of your body. Follow what your doctor tells you about food and drink. You may also be told to change or stop some of your medicines. After the procedure, do not drive for 24 hours or as long as your doctor tells you. This information is not intended to replace advice given to you by your health care provider. Make sure you discuss any questions you have with your health care provider. Document Revised: 12/12/2021 Document Reviewed: 08/24/2019 Elsevier Patient Education  2023 [employer]   Cardiac Ablation, Care After  This sheet gives you information about how to care for yourself after your procedure. Your health care  provider may also give you more specific instructions. If you have problems or questions, contact your health care provider. What can I expect after the procedure? After the procedure, it is common to have: Bruising around your puncture site. Tenderness around your puncture site. Skipped heartbeats. If you had an atrial fibrillation ablation, you may have atrial fibrillation during the first several months after your procedure.  Tiredness (fatigue).  Follow these instructions at home: Puncture site care  Follow instructions from your health care provider about how to take care of your puncture site. Make sure you: If present, leave stitches (sutures), skin glue, or adhesive strips in place. These skin closures may need to stay in place for up to 2 weeks. If adhesive strip edges start to loosen and curl up, you may trim the loose edges. Do not remove adhesive strips completely unless your health care provider tells you to do that. If a large square bandage is present, this may be removed 24 hours after surgery.  Check your puncture site every day for signs of infection. Check for: Redness, swelling, or pain. Fluid  or blood. If your puncture site starts to bleed, lie down on your back, apply firm pressure to the area, and contact your health care provider. Warmth. Pus or a bad smell. A pea or marble sized lump/knot at the site is normal and can take up to three months to resolve.  Driving Do not drive for at least 4 days after your procedure or however long your health care provider recommends. (Do not resume driving if you have previously been instructed not to drive for other health reasons.) Do not drive or use heavy machinery while taking prescription pain medicine. Activity Avoid activities that take a lot of effort for at least 7 days after your procedure. Do not lift anything that is heavier than 5 lb (4.5 kg) for one week.  No sexual activity for 1 week.  Return to your normal  activities as told by your health care provider. Ask your health care provider what activities are safe for you. General instructions Take over-the-counter and prescription medicines only as told by your health care provider. Do not use any products that contain nicotine or tobacco, such as cigarettes and e-cigarettes. If you need help quitting, ask your health care provider. You may shower after 24 hours, but Do not take baths, swim, or use a hot tub for 1 week.  Do not drink alcohol  for 24 hours after your procedure. Keep all follow-up visits as told by your health care provider. This is important. Contact a health care provider if: You have redness, mild swelling, or pain around your puncture site. You have fluid or blood coming from your puncture site that stops after applying firm pressure to the area. Your puncture site feels warm to the touch. You have pus or a bad smell coming from your puncture site. You have a fever. You have chest pain or discomfort that spreads to your neck, jaw, or arm. You have chest pain that is worse with lying on your back or taking a deep breath. You are sweating a lot. You feel nauseous. You have a fast or irregular heartbeat. You have shortness of breath. You are dizzy or light-headed and feel the need to lie down. You have pain or numbness in the arm or leg closest to your puncture site. Get help right away if: Your puncture site suddenly swells. Your puncture site is bleeding and the bleeding does not stop after applying firm pressure to the area. These symptoms may represent a serious problem that is an emergency. Do not wait to see if the symptoms will go away. Get medical help right away. Call your local emergency services (911 in the [place] Do not drive yourself to the hospital. Summary After the procedure, it is normal to have bruising and tenderness at the puncture site in your groin, neck, or forearm. Check your puncture site every day for  signs of infection. Get help right away if your puncture site is bleeding and the bleeding does not stop after applying firm pressure to the area. This is a medical emergency. This information is not intended to replace advice given to you by your health care provider. Make sure you discuss any questions you have with your health care provider.

## 2024-08-17 NOTE — Progress Notes (Unsigned)
 Cardiology Office Note   Date: 08/17/24 ID:  Jesse Mcclure., DOB August 12, 1951, MRN [id number] PCP: [name], MD  [facility] Providers Cardiologist:  Wilbert Bihari, MD Cardiology APP:  Lelon Glendia DASEN, PA-C  Electrophysiologist:  Donnice DELENA Primus, MD    History of Present Illness Jesse Mcclure. is a 73 y.o. adult with persistent AF/typical AFL, Mobitz 1' AVB, HFpEF and HTN who presents back for further discussion on atrial arrhythmia management.  I last saw Jesse Mcclure on 07/10/2024 and at that time he was still having paroxysmal AF/AFL.  He had mild LA dilation on TTE on 06/24/2024.  He initially was diagnosed with typical atrial flutter on preop evaluation for total knee replacement on 06/27/2024.  He did not have obvious rhythm awareness at that time.  We had reviewed different options for rhythm management including rate control versus rhythm control with medications or ablation.  We discussed that management of AF is reasonable with either however typical atrial flutter is often more difficult to rhythm control with medications and ablation can be considered for first-line treatment.  He wanted to some time to discuss with his partner.  He was wearing a Zio patch during that time which showed 100% AF burden consistent with persistent AF with reasonable rate control with an average VR of 59.  I spoke with him over the phone and reviewed options again.   He is here today in clinic with his partner, Jesse Mcclure.  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  On further discussion Jesse Mcclure has noticed more exertional fatigue and exercise limitation but was not sure what was causing this.  The timing of it does reflect his persistent AF/AFL.  ROS: DOE, fatigue   Studies Reviewed  ECG review 06/29/24: AF/SVR 53, QRS 86, QT/c 428/401 06/28/24: NSR 87, QRS 82, QT/c 392/471, Mobitz I 2' AVB 06/27/24: AFL/VR 60, QRS 64, QT/c 292/292  Zio  AT Result date: 07/02/24-07/13/24 2 Ventricular Tachycardia runs occurred, the run with the fastest interval lasting 8 beats with a max rate of 190 bpm (avg 173 bpm); the run with the fastest interval was also the longest. Atrial Fibrillation occurred continuously (100% burden), ranging  from 33-107 bpm (avg of 59 bpm). Isolated VEs were rare (<1.0%, 5298), VE Couplets were rare (<1.0%, 53), and VE Triplets were rare (<1.0%, 7). Ventricular Trigeminy was present. Previously noptified: MD notification criteria for First Documentation of  Atrial Fibrillation met - report posted prior to notification (NAR), and MD notification criteria for Slow Atrial Fibrillation met - report posted prior to notification (VA).   TTE Result date: 06/28/24  1. Left ventricular ejection fraction, by estimation, is 70 to 75%. The  left ventricle has hyperdynamic function. The left ventricle has no  regional wall motion abnormalities. Left ventricular diastolic parameters  are indeterminate.   2. Right ventricular systolic function is normal. The right ventricular  size is normal. There is mildly elevated pulmonary artery systolic  pressure.   3. Left atrial size was mildly dilated.   4. A small pericardial effusion is present.   5. The mitral valve is normal in structure. No evidence of mitral valve  regurgitation. No evidence of mitral stenosis.   6. The aortic valve is tricuspid. Aortic valve regurgitation is not  visualized. Aortic valve sclerosis is present, with no evidence of aortic  valve stenosis.   7. The inferior vena cava is normal in size with greater than 50%  respiratory variability, suggesting right atrial pressure of 3 mmHg.    Risk Assessment/Calculations  CHA2DS2-VASc Score = 3  This indicates a 3.2% annual risk of stroke. The patient's score is based upon: CHF History: 1 HTN History: 1 Diabetes History: 0 Stroke History: 0 Vascular Disease History: 0 Age Score: 1 Gender Score:  0  Physical Exam VS:  BP (!) 157/71   Pulse (!) 53   Ht 5' 9 (1.753 m)   SpO2 97%   BMI 35.21 kg/m       Wt Readings from Last 3 Encounters:  08/14/24 238 lb 6.4 oz (108.1 kg)  07/21/24 240 lb 12.8 oz (109.2 kg)  07/13/24 240 lb 12.8 oz (109.2 kg)    GEN: Well nourished, well developed in no acute distress CARDIAC: irregular rhythm, rate controlled no murmurs, rubs, gallops RESPIRATORY:  Clear to auscultation without rales, wheezing or rhonchi  EXTREMITIES:  No edema; No deformity   ASSESSMENT AND PLAN Jesse Ang. is a 73 y.o. adult with persistent AF/typical AFL, Mobitz 1' AVB, HFpEF and HTN who presents back for further discussion on atrial arrhythmia management.  Persistent AF/AFL Typical AFL Mild LAE Reviewed different options for management of atrial arrhythmias again today with Jesse Mcclure and his partner.  He does not have an obvious rhythm awareness but has noticed some dyspnea on exertion and exercise limitation that may be associated with his persistent AF/AFL.  We reviewed options including rhythm management with antiarrhythmic medications, rate control and ablation.  Also reviewed options for cardioversion if he needs additional time to consider antiarrhythmic medications versus ablation.  He would like to proceed with catheter ablation.  Initially had planned to start amiodarone preprocedure however there may be an interaction with pomalidomide .  I reached out to his hematologist Dr. Lonn who felt that the combination of amiodarone and pomalidomide  was reasonable if it was needed.  For now will avoid but could use post procedure if he has a ERAF.   Discussed treatment options today for AF including antiarrhythmic drug therapy and ablation. Discussed risks, recovery and likelihood of success with each treatment strategy. Risk, benefits, and alternatives to EP study and ablation for afib were discussed. These risks include but are not limited to stroke, bleeding,  vascular damage, tamponade, perforation, damage to the esophagus, lungs, phrenic nerve and other structures, worsening renal function, coronary vasospasm and death.  Discussed potential need for repeat ablation procedures and antiarrhythmic drugs after an initial ablation. The patient understands these risk and wishes to proceed.  We will therefore proceed with catheter ablation. Carto, ICE, anesthesia are requested for the procedure.   Pre procedure details: Procedure date: 09/12/24 Carto/ICE/GA-anesthesia, no CT scan pre procedure  Varipulse+ST/SF D/F RF ablation for CTI  Take Xarelto  09/11/24 at night, hold all 09/12/24 AM medications   Dispo: RTC post ablation   A total of 45 minutes was spent preparing for the patient, reviewing history, performing exam, document encounter, coordinating care and counseling the patient. 35 minutes was spent with direct patient care.   Signed, Donnice DELENA Primus, MD

## 2024-08-18 LAB — CBC
Hematocrit: 41.2 % (ref 37.5–51.0)
Hemoglobin: 13.2 g/dL (ref 13.0–17.7)
MCH: 34.9 pg — ABNORMAL HIGH (ref 26.6–33.0)
MCHC: 32 g/dL (ref 31.5–35.7)
MCV: 109 fL — ABNORMAL HIGH (ref 79–97)
Platelets: 187 x10E3/uL (ref 150–450)
RBC: 3.78 x10E6/uL — ABNORMAL LOW (ref 4.14–5.80)
RDW: 14.2 % (ref 11.6–15.4)
WBC: 3.6 x10E3/uL (ref 3.4–10.8)

## 2024-08-18 LAB — BASIC METABOLIC PANEL WITH GFR
BUN/Creatinine Ratio: 19 (ref 10–24)
BUN: 16 mg/dL (ref 8–27)
CO2: 25 mmol/L (ref 20–29)
Calcium: 8.6 mg/dL (ref 8.6–10.2)
Chloride: 105 mmol/L (ref 96–106)
Creatinine, Ser: 0.86 mg/dL (ref 0.76–1.27)
Glucose: 73 mg/dL (ref 70–99)
Potassium: 4.4 mmol/L (ref 3.5–5.2)
Sodium: 141 mmol/L (ref 134–144)
eGFR: 92 mL/min/1.73 (ref >59)

## 2024-08-18 LAB — MULTIPLE MYELOMA PANEL, SERUM
Albumin SerPl Elph-Mcnc: 3.2 g/dL (ref 2.9–4.4)
Albumin/Glob SerPl: 1.1 (ref 0.7–1.7)
Alpha 1: 0.3 g/dL (ref 0.0–0.4)
Alpha2 Glob SerPl Elph-Mcnc: 0.8 g/dL (ref 0.4–1.0)
B-Globulin SerPl Elph-Mcnc: 0.8 g/dL (ref 0.7–1.3)
Gamma Glob SerPl Elph-Mcnc: 1.2 g/dL (ref 0.4–1.8)
Globulin, Total: 3.1 g/dL (ref 2.2–3.9)
IgA: 117 mg/dL (ref 61–437)
IgG (Immunoglobin G), Serum: 1371 mg/dL (ref 603–1613)
IgM (Immunoglobulin M), Srm: 67 mg/dL (ref 15–143)
M Protein SerPl Elph-Mcnc: 0.8 g/dL — ABNORMAL HIGH
Total Protein ELP: 6.3 g/dL (ref 6.0–8.5)

## 2024-08-21 ENCOUNTER — Other Ambulatory Visit: Payer: Self-pay

## 2024-08-21 ENCOUNTER — Emergency Department (HOSPITAL_BASED_OUTPATIENT_CLINIC_OR_DEPARTMENT_OTHER)
Admission: EM | Admit: 2024-08-21 | Discharge: 2024-08-21 | Disposition: A | Discharge status: Home / Self Care | Attending: Emergency Medicine | Admitting: Emergency Medicine

## 2024-08-21 ENCOUNTER — Emergency Department (HOSPITAL_BASED_OUTPATIENT_CLINIC_OR_DEPARTMENT_OTHER): Admitting: Radiology

## 2024-08-21 ENCOUNTER — Encounter (HOSPITAL_BASED_OUTPATIENT_CLINIC_OR_DEPARTMENT_OTHER): Payer: Self-pay

## 2024-08-21 DIAGNOSIS — I110 Hypertensive heart disease with heart failure: Secondary | ICD-10-CM | POA: Diagnosis not present

## 2024-08-21 DIAGNOSIS — R0989 Other specified symptoms and signs involving the circulatory and respiratory systems: Secondary | ICD-10-CM | POA: Diagnosis not present

## 2024-08-21 DIAGNOSIS — J9811 Atelectasis: Secondary | ICD-10-CM | POA: Diagnosis not present

## 2024-08-21 DIAGNOSIS — S2232XA Fracture of one rib, left side, initial encounter for closed fracture: Secondary | ICD-10-CM | POA: Diagnosis not present

## 2024-08-21 DIAGNOSIS — J90 Pleural effusion, not elsewhere classified: Secondary | ICD-10-CM | POA: Diagnosis not present

## 2024-08-21 DIAGNOSIS — Z7901 Long term (current) use of anticoagulants: Secondary | ICD-10-CM | POA: Diagnosis not present

## 2024-08-21 DIAGNOSIS — R079 Chest pain, unspecified: Secondary | ICD-10-CM | POA: Insufficient documentation

## 2024-08-21 DIAGNOSIS — I509 Heart failure, unspecified: Secondary | ICD-10-CM | POA: Diagnosis not present

## 2024-08-21 DIAGNOSIS — R0789 Other chest pain: Secondary | ICD-10-CM | POA: Diagnosis not present

## 2024-08-21 LAB — BASIC METABOLIC PANEL WITH GFR
Anion gap: 12 (ref 5–15)
BUN: 21 mg/dL (ref 8–23)
CO2: 24 mmol/L (ref 22–32)
Calcium: 9.3 mg/dL (ref 8.9–10.3)
Chloride: 104 mmol/L (ref 98–111)
Creatinine, Ser: 0.82 mg/dL (ref 0.61–1.24)
GFR, Estimated: >60 mL/min (ref >60)
Glucose, Bld: 116 mg/dL — ABNORMAL HIGH (ref 70–99)
Potassium: 3.9 mmol/L (ref 3.5–5.1)
Sodium: 140 mmol/L (ref 135–145)

## 2024-08-21 LAB — CBC
HCT: 41.3 % (ref 39.0–52.0)
Hemoglobin: 13.9 g/dL (ref 13.0–17.0)
MCH: 36 pg — ABNORMAL HIGH (ref 26.0–34.0)
MCHC: 33.7 g/dL (ref 30.0–36.0)
MCV: 107 fL — ABNORMAL HIGH (ref 80.0–100.0)
Platelets: 172 K/uL (ref 150–400)
RBC: 3.86 MIL/uL — ABNORMAL LOW (ref 4.22–5.81)
RDW: 15.9 % — ABNORMAL HIGH (ref 11.5–15.5)
WBC: 4.4 K/uL (ref 4.0–10.5)
nRBC: 0 % (ref 0.0–0.2)

## 2024-08-21 LAB — PRO BRAIN NATRIURETIC PEPTIDE: Pro Brain Natriuretic Peptide: 1491 pg/mL — ABNORMAL HIGH (ref <300.0)

## 2024-08-21 LAB — TROPONIN T, HIGH SENSITIVITY
Troponin T High Sensitivity: 21 ng/L — ABNORMAL HIGH (ref 0–19)
Troponin T High Sensitivity: 22 ng/L — ABNORMAL HIGH (ref 0–19)

## 2024-08-21 MED ORDER — FUROSEMIDE 10 MG/ML IJ SOLN
20.0000 mg | Freq: Once | INTRAMUSCULAR | Status: AC
  Administered 2024-08-21: 20 mg via INTRAVENOUS
  Filled 2024-08-21: qty 2

## 2024-08-21 MED ORDER — OXYCODONE-ACETAMINOPHEN 5-325 MG PO TABS
1.0000 | ORAL_TABLET | Freq: Once | ORAL | Status: AC
  Administered 2024-08-21: 1 via ORAL
  Filled 2024-08-21: qty 1

## 2024-08-21 MED ORDER — OXYCODONE HCL 5 MG PO TABS
5.0000 mg | ORAL_TABLET | Freq: Once | ORAL | Status: AC
  Administered 2024-08-21: 5 mg via ORAL
  Filled 2024-08-21: qty 1

## 2024-08-21 MED ORDER — CYCLOBENZAPRINE HCL 5 MG PO TABS
2.5000 mg | ORAL_TABLET | Freq: Once | ORAL | Status: AC
  Administered 2024-08-21: 2.5 mg via ORAL
  Filled 2024-08-21: qty 1

## 2024-08-21 NOTE — Discharge Instructions (Addendum)
 I would increase your furosemide /Lasix  to double dose daily for the next 4 days and follow-up with your primary care doctor.  After 4 days go back to your normal dose.  So if you take 10 mg daily increase to 20 mg daily.  If you take 20 mg daily increase to 40 mg daily.  After 4 days go back down to your normal dose.  Follow-up with your cardiology team or primary care doctor.  Return if symptoms worsen.

## 2024-08-21 NOTE — ED Provider Notes (Addendum)
 Repeat troponin is normal.  He is feeling much better after IV Lasix .  Continue Lasix  doubled the dose daily at home and follow-up with cardiology and primary care team.  Understands return precautions.  Discharged good condition.  Otherwise we have no concern for acute process.  Mild heart failure exacerbation but he is on room air he is comfortable with his work of breathing.  Understands return precautions.  Discharge.  This chart was dictated using voice recognition software.  Despite best efforts to proofread,  errors can occur which can change the documentation meaning.    Ruthe Cornet, DO 08/21/24 [id number]    Ruthe Cornet, DO 08/21/24 [phone]

## 2024-08-21 NOTE — ED Triage Notes (Signed)
 Pt to triage c/o CP under right arm 10/10 sharp in nature x 2 hours. Pt denies injury or alleviating factors. Pt states he took Oxycodone  5mg  x 2 prior to arrival. EKG completed, IV established, Labs drawn. VSS NAD PT on room air.

## 2024-08-21 NOTE — ED Provider Notes (Signed)
 [name] Provider Note   CSN: [id number] Arrival date & time: 08/21/24  0540     Patient presents with: Chest Pain   Jesse Mcclure. is a 73 y.o. adult.  {Add pertinent medical, surgical, social history, OB history to YEP:67052} The patient is presenting with severe muscle pain that began yesterday afternoon. The pain initially improved after taking a muscle relaxer and oxycodone  but intensified later. The pain is located on the right side and worsens with deep breathing. The patient has a history of atrial fibrillation, for which an ablation is scheduled in December, and is on blood thinners, specifically Xarelto . The patient also has a history of congestive heart failure and is currently taking Lasix . There is no history of smoking. This morning, the patient experienced shortness of breath upon waking, but denies current shortness of breath, nausea, or lightheadedness. The patient denies any recent heavy lifting or trauma. History was obtained from both the patient and their partner.   Chest Pain      Prior to Admission medications   Medication Sig Start Date End Date Taking? Authorizing Provider  acetaminophen  (TYLENOL ) 500 MG tablet Take 1,000 mg by mouth every 6 (six) hours as needed.    [provider]  acyclovir  (ZOVIRAX ) 400 MG tablet Take 1 tablet (400 mg total) by mouth daily. 06/22/24   Lonn Hicks, MD  amiodarone (PACERONE) 200 MG tablet Take 2 tablets (400 mg total) by mouth 2 (two) times daily for 14 days, THEN 1 tablet (200 mg total) daily thereafter. 08/17/24 10/04/24  Almetta Donnice LABOR, MD  atorvastatin  (LIPITOR) 20 MG tablet Take 1 tablet (20 mg total) by mouth daily. 05/09/24   Lalonde, John C, MD  calcium  carbonate (TUMS - DOSED IN MG ELEMENTAL CALCIUM ) 500 MG chewable tablet Chew 1 tablet by mouth 2 (two) times daily.    [provider]  cephALEXin  (KEFLEX ) 500 MG capsule Take 1 capsule by mouth 4  times a day as directed for 7 days. 07/13/24     Cholecalciferol  (VITAMIN D ) 50 MCG (2000 UT) tablet Take 2,000 Units by mouth daily.    [provider]  empagliflozin  (JARDIANCE ) 10 MG TABS tablet Take 1 tablet (10 mg total) by mouth daily. 05/09/24   Lalonde, John C, MD  folic acid  (FOLVITE ) 400 MCG tablet Take 400 mcg by mouth daily.    [provider]  furosemide  (LASIX ) 20 MG tablet Take 1 tablet (20 mg total) by mouth daily. 08/04/24   [name], MD  ixazomib citrate  (NINLARO ) 3 MG capsule Take 1 capsule (3 mg) by mouth weekly, 3 weeks on, 1 week off, repeat every 4 weeks. Take on an empty stomach 1hr before or 2hr after meals. 10/15/23   Lonn Hicks, MD  losartan  (COZAAR ) 100 MG tablet Take 1 tablet (100 mg total) by mouth daily. 05/09/24 05/09/25  [name], MD  methocarbamol  (ROBAXIN ) 500 MG tablet Take 1 tablet (500 mg total) by mouth every 6 (six) hours as needed for muscle spasms. 06/29/24   Brown, Blaine K, PA-C  Multiple Vitamin (MULTI-VITAMIN) tablet Take 1 tablet by mouth daily. 08/30/19   [provider]  mupirocin ointment (BACTROBAN) 2 % Apply 1 Application topically 2 (two) times daily. 07/14/24   Gershon Donnice SAUNDERS, DPM  ondansetron  (ZOFRAN ) 4 MG tablet Take 1 tablet (4 mg total) by mouth every 8 (eight) hours as needed for nausea or vomiting. 06/29/24   Brown, Blaine K, PA-C  oxyCODONE  (OXY IR/ROXICODONE ) 5 MG immediate release tablet Take 1 tablet (5 mg total) by mouth every 4 (four) hours as needed for severe pain (pain score 7-10). 08/07/24   Walisiewicz, Kaitlyn E, PA-C  Polyethyl Glycol-Propyl Glycol (SYSTANE OP) Place 1 drop into both eyes daily as needed (dry eyes).    [provider]  POMALYST  3 MG capsule TAKE 1 CAPSULE BY MOUTH 1 TIME A DAY FOR 21 DAYS ON THEN 7 DAYS OFF 08/17/24   Lonn Hicks, MD  primidone  (MYSOLINE ) 50 MG tablet Take 1 tablet (50 mg total) by mouth at bedtime. Patient taking differently: Take 50 mg by mouth at  bedtime as needed (TREMORS). 08/06/23   Vaslow, Zachary K, MD  prochlorperazine  (COMPAZINE ) 10 MG tablet TAKE 1 TABLET(10 MG) BY MOUTH EVERY 6 HOURS AS NEEDED FOR NAUSEA OR VOMITING 03/13/24   Lonn Hicks, MD  rivaroxaban  (XARELTO ) 10 MG TABS tablet Take 1 tablet (10 mg total) by mouth daily with supper. 08/04/24   Lonn Hicks, MD  rivaroxaban  (XARELTO ) 20 MG TABS tablet Take 1 tablet (20 mg total) by mouth daily with supper. 06/30/24   Brown, Blaine K, PA-C  sennosides-docusate sodium  (SENOKOT-S) 8.6-50 MG tablet Take 2 tablets by mouth daily. 06/29/24   Brown, Blaine K, PA-C  vitamin B-12 (CYANOCOBALAMIN ) 1000 MCG tablet Take 1,000 mcg by mouth daily.    [provider]    Allergies: Keflex  [cephalexin ]    Review of Systems  Cardiovascular:  Positive for chest pain.    Updated Vital Signs BP (!) 169/74 (BP Location: Right Arm)   Pulse (!) 59   Temp 98 F (36.7 C) (Oral)   Resp 18   Ht 5' 9 (1.753 m)   Wt 108.1 kg   SpO2 99%   BMI 35.19 kg/m   Physical Exam  (all labs ordered are listed, but only abnormal results are displayed) Labs Reviewed  BASIC METABOLIC PANEL WITH GFR - Abnormal; Notable for the following components:      Result Value   Glucose, Bld 116 (*)    All other components within normal limits  CBC - Abnormal; Notable for the following components:   RBC 3.86 (*)    MCV 107.0 (*)    MCH 36.0 (*)    RDW 15.9 (*)    All other components within normal limits  TROPONIN T, HIGH SENSITIVITY - Abnormal; Notable for the following components:   Troponin T High Sensitivity 21 (*)    All other components within normal limits  PRO BRAIN NATRIURETIC PEPTIDE    EKG: None  Radiology: DG Chest 2 View Result Date: 08/21/2024 EXAM: 2 VIEW(S) XRAY OF THE CHEST 08/21/2024 06:28:09 AM COMPARISON: 05/16/2023. CLINICAL HISTORY: CP. FINDINGS: LUNGS AND PLEURA: Small bilateral pleural effusions, bibasilar atelectasis, and pulmonary vascular congestion without frank edema.  No pneumothorax. HEART AND MEDIASTINUM: Aortic atherosclerosis. Stable cardiomediastinal contours. BONES AND SOFT TISSUES: Remote left anterior rib fracture. IMPRESSION: 1. Small bilateral pleural effusions with bibasilar atelectasis. 2. Pulmonary vascular congestion without frank edema. Electronically signed by: Waddell Calk MD 08/21/2024 06:33 AM EST RP Workstation: [id number]    {Document cardiac monitor, telemetry assessment procedure when appropriate:32947} Procedures   Medications Ordered in the ED  furosemide  (LASIX ) injection 20 mg (has no administration in time range)  cyclobenzaprine  (FLEXERIL ) tablet 2.5 mg (has no administration in time range)  oxyCODONE -acetaminophen  (PERCOCET/ROXICET) 5-325 MG per tablet 1 tablet (has no administration in time range)    Clinical Course as of 08/21/24 0701  Mon  Aug 21, 2024  9355 Initial Evaluation:  Patient presents with severe muscle-like pain after showering, improved initially with muscle relaxants but later worsened.  Plan:  Administer a small dose of Lasix  for fluid management pending second troponin and BNP. Provide muscle relaxant for pain relief. Conduct a second troponin test (Trop 21, if had been negative I'd not rechecked but with his history will get delta.). Follow up with cardiologist for atrial fibrillation and heart failure management. [JM]  0647 Troponin T High Sensitivity(!): 21 [JM]  0647 Hemoglobin: 13.9 [JM]  0647 Creatinine: 0.82 [JM]    Clinical Course User Index [JM] Parrie Rasco, Selinda, MD   {Click here for ABCD2, HEART and other calculators REFRESH Note before signing:1}                              Medical Decision Making Amount and/or Complexity of Data Reviewed Labs: ordered. Radiology: ordered.  Risk Prescription drug management.   ***  {Document critical care time when appropriate  Document review of labs and clinical decision tools ie CHADS2VASC2, etc  Document your independent review of radiology  images and any outside records  Document your discussion with family members, caretakers and with consultants  Document social determinants of health affecting pt's care  Document your decision making why or why not admission, treatments were needed:32947:::1}   Final diagnoses:  None    ED Discharge Orders     None

## 2024-08-22 ENCOUNTER — Other Ambulatory Visit: Payer: Self-pay | Admitting: Hematology and Oncology

## 2024-08-22 ENCOUNTER — Other Ambulatory Visit (HOSPITAL_COMMUNITY): Payer: Self-pay

## 2024-08-22 ENCOUNTER — Telehealth: Payer: Self-pay

## 2024-08-22 ENCOUNTER — Other Ambulatory Visit: Payer: Self-pay

## 2024-08-22 ENCOUNTER — Telehealth (HOSPITAL_COMMUNITY): Payer: Self-pay

## 2024-08-22 DIAGNOSIS — C9000 Multiple myeloma not having achieved remission: Secondary | ICD-10-CM

## 2024-08-22 MED ORDER — OXYCODONE HCL 5 MG PO TABS
5.0000 mg | ORAL_TABLET | ORAL | 0 refills | Status: DC | PRN
  Filled 2024-08-22 (×2): qty 90, 15d supply, fill #0

## 2024-08-22 NOTE — Telephone Encounter (Signed)
Called and given message Rx sent. He verbalized understanding. °

## 2024-08-22 NOTE — Telephone Encounter (Signed)
 Patient is scheduled for an AF/AFL ablation on 12/9. He was seen in the ED on yesterday for Mild heart failure exacerbation, BNP 1491. He reported feeling much better after IV Lasix  and advised to double his daily Lasix  dose at home and follow-up with cardiology and primary care team. Can you please arrange for a follow up appointment with patient.   Dr. Almetta- will this impact procedure as scheduled and will patient need to be seen prior to ablation?

## 2024-08-22 NOTE — Telephone Encounter (Signed)
He called and left a message requesting oxycodone refill to pharmacy.

## 2024-08-23 ENCOUNTER — Telehealth (HOSPITAL_COMMUNITY): Payer: Self-pay

## 2024-08-23 ENCOUNTER — Encounter (HOSPITAL_COMMUNITY): Payer: Self-pay

## 2024-08-23 NOTE — Telephone Encounter (Signed)
 Attempted to reach patient to discuss upcoming procedure, no answer. Left VM for patient to return call.

## 2024-08-25 ENCOUNTER — Inpatient Hospital Stay

## 2024-08-25 DIAGNOSIS — C9000 Multiple myeloma not having achieved remission: Secondary | ICD-10-CM | POA: Diagnosis not present

## 2024-08-25 DIAGNOSIS — T451X5A Adverse effect of antineoplastic and immunosuppressive drugs, initial encounter: Secondary | ICD-10-CM | POA: Diagnosis not present

## 2024-08-25 DIAGNOSIS — D701 Agranulocytosis secondary to cancer chemotherapy: Secondary | ICD-10-CM | POA: Diagnosis not present

## 2024-08-25 DIAGNOSIS — Z79899 Other long term (current) drug therapy: Secondary | ICD-10-CM | POA: Diagnosis not present

## 2024-08-25 NOTE — Patient Instructions (Signed)

## 2024-08-25 NOTE — Telephone Encounter (Signed)
 Please make sure note sent to scheduling to arrange post ED follow up. Glendia Ferrier, PA-C    08/25/2024 1:09 PM

## 2024-08-25 NOTE — Progress Notes (Signed)
 Jesse Mcclure. presents today for phlebotomy per MD orders. Phlebotomy procedure started at 1538 using a standard phlebotomy kit using left Antecubital and ended at 1551. 502 grams removed.Pressure dressing applied. Patient observed for 30 minutes after procedure without any incident. Snack provided  Patient tolerated procedure well. IV needle removed intact. VSS, pt ambulated from infusion area independently with no complaints

## 2024-08-27 NOTE — Telephone Encounter (Signed)
 Please make patient is set up for post ED follow up with Dr. Shlomo or me in the next 1-2 weeks. Glendia Ferrier, PA-C    08/27/2024 9:20 PM

## 2024-08-28 ENCOUNTER — Other Ambulatory Visit (HOSPITAL_COMMUNITY): Payer: Self-pay

## 2024-08-30 NOTE — Telephone Encounter (Signed)
 Patient has been scheduled with Jesse Mcclure on 09/19/2024 at 2:45pm. Patient voiced understanding.

## 2024-09-05 ENCOUNTER — Other Ambulatory Visit: Payer: Self-pay

## 2024-09-05 ENCOUNTER — Other Ambulatory Visit (HOSPITAL_COMMUNITY): Payer: Self-pay

## 2024-09-05 ENCOUNTER — Telehealth: Payer: Self-pay

## 2024-09-05 ENCOUNTER — Other Ambulatory Visit: Payer: Self-pay | Admitting: Hematology and Oncology

## 2024-09-05 DIAGNOSIS — C9000 Multiple myeloma not having achieved remission: Secondary | ICD-10-CM

## 2024-09-05 MED ORDER — OXYCODONE HCL 5 MG PO TABS
5.0000 mg | ORAL_TABLET | ORAL | 0 refills | Status: DC | PRN
  Filled 2024-09-05: qty 90, 15d supply, fill #0

## 2024-09-05 NOTE — Telephone Encounter (Signed)
 He called and left a message requesting oxycodone  refill please.

## 2024-09-05 NOTE — Telephone Encounter (Signed)
Called and given below message. He verbalized understanding. 

## 2024-09-11 ENCOUNTER — Telehealth: Payer: Self-pay

## 2024-09-11 NOTE — Pre-Procedure Instructions (Signed)
 Attempted to call patient regarding procedure instructions.  Left voicemail on the following items: Arrival time 0730 Nothing to eat or drink after midnight No meds AM of procedure Responsible person to drive you home and stay with you for 24 hrs  Have you missed any doses of anti-coagulant Xarelto- should be taken once a day, if you have missed any doses please let us know.

## 2024-09-11 NOTE — Telephone Encounter (Signed)
 Oral Oncology Patient Advocate Encounter  Was successful in securing patient a $8000 grant from [facility] to provide copayment coverage for Ninlaro .  This will keep the out of pocket expense at $0.     [employer] ID: [id number]   The billing information is as follows and has been shared with [facility].    RxBin: [id number] PCN: [id number] Member ID: [id number] Group ID: [id number] Dates of Eligibility: 10/11/24 through 10/10/25  Fund:  MM  Lucie Lamer, CPhT [place]  [facility] Oncology Pharmacy Patient Advocate Specialist [facility] [place] Phone: [phone]  Fax: [phone] [email or web address]

## 2024-09-12 ENCOUNTER — Encounter (HOSPITAL_COMMUNITY): Payer: Self-pay | Admitting: Student in an Organized Health Care Education/Training Program

## 2024-09-12 ENCOUNTER — Other Ambulatory Visit: Payer: Self-pay

## 2024-09-12 ENCOUNTER — Ambulatory Visit (HOSPITAL_COMMUNITY)

## 2024-09-12 ENCOUNTER — Ambulatory Visit (HOSPITAL_COMMUNITY): Payer: Self-pay | Admitting: Registered Nurse

## 2024-09-12 ENCOUNTER — Other Ambulatory Visit (HOSPITAL_COMMUNITY): Payer: Self-pay

## 2024-09-12 ENCOUNTER — Encounter: Payer: Self-pay | Admitting: Emergency Medicine

## 2024-09-12 ENCOUNTER — Ambulatory Visit (HOSPITAL_COMMUNITY)
Admission: RE | Disposition: A | Payer: Self-pay | Source: Home / Self Care | Attending: Student in an Organized Health Care Education/Training Program

## 2024-09-12 ENCOUNTER — Ambulatory Visit (HOSPITAL_COMMUNITY)
Admission: RE | Admit: 2024-09-12 | Discharge: 2024-09-12 | Disposition: A | Attending: Student in an Organized Health Care Education/Training Program | Admitting: Student in an Organized Health Care Education/Training Program

## 2024-09-12 DIAGNOSIS — Z96651 Presence of right artificial knee joint: Secondary | ICD-10-CM | POA: Diagnosis not present

## 2024-09-12 DIAGNOSIS — Z7901 Long term (current) use of anticoagulants: Secondary | ICD-10-CM | POA: Diagnosis not present

## 2024-09-12 DIAGNOSIS — I483 Typical atrial flutter: Secondary | ICD-10-CM | POA: Diagnosis not present

## 2024-09-12 DIAGNOSIS — I5032 Chronic diastolic (congestive) heart failure: Secondary | ICD-10-CM | POA: Diagnosis not present

## 2024-09-12 DIAGNOSIS — E785 Hyperlipidemia, unspecified: Secondary | ICD-10-CM | POA: Diagnosis not present

## 2024-09-12 DIAGNOSIS — I4819 Other persistent atrial fibrillation: Secondary | ICD-10-CM | POA: Diagnosis present

## 2024-09-12 DIAGNOSIS — I3139 Other pericardial effusion (noninflammatory): Secondary | ICD-10-CM | POA: Diagnosis not present

## 2024-09-12 DIAGNOSIS — I484 Atypical atrial flutter: Secondary | ICD-10-CM | POA: Diagnosis not present

## 2024-09-12 DIAGNOSIS — I110 Hypertensive heart disease with heart failure: Secondary | ICD-10-CM | POA: Diagnosis not present

## 2024-09-12 HISTORY — PX: A-FLUTTER ABLATION: EP1230

## 2024-09-12 LAB — ECHOCARDIOGRAM COMPLETE
Area-P 1/2: 3.21 cm2
Calc EF: 73.9 %
S' Lateral: 3.1 cm
Single Plane A2C EF: 70 %
Single Plane A4C EF: 77.8 %

## 2024-09-12 SURGERY — A-FLUTTER ABLATION
Anesthesia: General

## 2024-09-12 MED ORDER — PERFLUTREN LIPID MICROSPHERE
1.0000 mL | INTRAVENOUS | Status: AC | PRN
  Administered 2024-09-12: 2 mL via INTRAVENOUS

## 2024-09-12 MED ORDER — RIVAROXABAN 20 MG PO TABS
20.0000 mg | ORAL_TABLET | Freq: Every day | ORAL | 3 refills | Status: AC
  Filled 2024-09-12 – 2024-09-15 (×2): qty 90, 90d supply, fill #0

## 2024-09-12 MED ORDER — LIDOCAINE 2% (20 MG/ML) 5 ML SYRINGE
INTRAMUSCULAR | Status: DC | PRN
  Administered 2024-09-12: 60 mg via INTRAVENOUS

## 2024-09-12 MED ORDER — CEFAZOLIN SODIUM-DEXTROSE 2-4 GM/100ML-% IV SOLN
INTRAVENOUS | Status: DC
  Filled 2024-09-12: qty 100

## 2024-09-12 MED ORDER — HEPARIN (PORCINE) IN NACL 1000-0.9 UT/500ML-% IV SOLN
INTRAVENOUS | Status: DC | PRN
  Administered 2024-09-12: 500 mL

## 2024-09-12 MED ORDER — PROTAMINE SULFATE 10 MG/ML IV SOLN
INTRAVENOUS | Status: DC | PRN
  Administered 2024-09-12: 40 mg via INTRAVENOUS

## 2024-09-12 MED ORDER — HEPARIN SODIUM (PORCINE) 1000 UNIT/ML IJ SOLN
INTRAMUSCULAR | Status: DC | PRN
  Administered 2024-09-12: 20000 [IU] via INTRAVENOUS

## 2024-09-12 MED ORDER — SODIUM CHLORIDE 0.9 % IV SOLN: 250.0000 mL | INTRAVENOUS | Status: DC | PRN

## 2024-09-12 MED ORDER — SODIUM CHLORIDE 0.9% FLUSH: 3.0000 mL | Freq: Two times a day (BID) | INTRAVENOUS | Status: DC

## 2024-09-12 MED ORDER — PHENYLEPHRINE HCL-NACL 20-0.9 MG/250ML-% IV SOLN
INTRAVENOUS | Status: DC | PRN
  Administered 2024-09-12: 30 ug/min via INTRAVENOUS

## 2024-09-12 MED ORDER — ROCURONIUM BROMIDE 10 MG/ML (PF) SYRINGE
PREFILLED_SYRINGE | INTRAVENOUS | Status: DC | PRN
  Administered 2024-09-12: 70 mg via INTRAVENOUS
  Administered 2024-09-12 (×2): 10 mg via INTRAVENOUS

## 2024-09-12 MED ORDER — FENTANYL CITRATE (PF) 250 MCG/5ML IJ SOLN
INTRAMUSCULAR | Status: DC | PRN
  Administered 2024-09-12 (×2): 50 ug via INTRAVENOUS

## 2024-09-12 MED ORDER — FENTANYL CITRATE (PF) 100 MCG/2ML IJ SOLN
INTRAMUSCULAR | Status: AC
  Filled 2024-09-12: qty 2

## 2024-09-12 MED ORDER — DEXAMETHASONE SOD PHOSPHATE PF 10 MG/ML IJ SOLN
INTRAMUSCULAR | Status: DC | PRN
  Administered 2024-09-12: 10 mg via INTRAVENOUS

## 2024-09-12 MED ORDER — RIVAROXABAN 20 MG PO TABS
20.0000 mg | ORAL_TABLET | Freq: Every day | ORAL | Status: DC
  Administered 2024-09-12: 20 mg via ORAL
  Filled 2024-09-12: qty 1

## 2024-09-12 MED ORDER — ONDANSETRON HCL 4 MG/2ML IJ SOLN
INTRAMUSCULAR | Status: DC | PRN
  Administered 2024-09-12: 4 mg via INTRAVENOUS

## 2024-09-12 MED ORDER — CEFAZOLIN SODIUM-DEXTROSE 2-3 GM-%(50ML) IV SOLR
INTRAVENOUS | Status: DC | PRN
  Administered 2024-09-12: 2 g via INTRAVENOUS

## 2024-09-12 MED ORDER — SUGAMMADEX SODIUM 200 MG/2ML IV SOLN
INTRAVENOUS | Status: DC | PRN
  Administered 2024-09-12: 400 mg via INTRAVENOUS

## 2024-09-12 MED ORDER — HEPARIN SODIUM (PORCINE) 1000 UNIT/ML IJ SOLN
INTRAMUSCULAR | Status: AC
  Filled 2024-09-12: qty 10

## 2024-09-12 MED ORDER — PROPOFOL 10 MG/ML IV BOLUS
INTRAVENOUS | Status: DC | PRN
  Administered 2024-09-12: 150 mg via INTRAVENOUS
  Administered 2024-09-12: 50 mg via INTRAVENOUS

## 2024-09-12 MED ORDER — SODIUM CHLORIDE 0.9 % IV SOLN: INTRAVENOUS | Status: DC

## 2024-09-12 MED ORDER — SODIUM CHLORIDE 0.9% FLUSH: 3.0000 mL | INTRAVENOUS | Status: DC | PRN

## 2024-09-12 MED ORDER — HEPARIN SODIUM (PORCINE) 1000 UNIT/ML IJ SOLN
INTRAMUSCULAR | Status: DC | PRN
  Administered 2024-09-12: 1000 [IU] via INTRAVENOUS

## 2024-09-12 SURGICAL SUPPLY — 16 items
CABLE VARIPULSE STERILE (CATHETERS) IMPLANT
CATH GE 8FR SOUNDSTAR (CATHETERS) IMPLANT
CATH OCTARAY 2.0 F 3-3-3-3-3 (CATHETERS) IMPLANT
CATH SMTCH THERMOCOOL SF DF (CATHETERS) IMPLANT
CATH WEBSTER BI DIR CS D-F CRV (CATHETERS) IMPLANT
CATHETER VARIPULSE 8.5FR (CATHETERS) IMPLANT
CLOSURE PERCLOSE PROSTYLE (Vascular Products) IMPLANT
COVER SWIFTLINK CONNECTOR (BAG) ×1 IMPLANT
KIT VERSACROSS 8.5F 63 45D 180 (KITS) IMPLANT
PACK EP LF (CUSTOM PROCEDURE TRAY) ×1 IMPLANT
PAD DEFIB RADIO PHYSIO CONN (PAD) ×1 IMPLANT
PATCH CARTO3 (PAD) IMPLANT
SHEATH CARTO VIZIGO MED CURVE (SHEATH) IMPLANT
SHEATH PINNACLE 8F 10CM (SHEATH) IMPLANT
SHEATH PINNACLE 9F 10CM (SHEATH) IMPLANT
TUBING SMART ABLATE COOLFLOW (TUBING) IMPLANT

## 2024-09-12 NOTE — H&P (Signed)
 Date: 09/12/24 ID:  Jesse Mcclure., DOB July 23, 1951, MRN [id number] PCP: [name], MD  [facility] Providers Cardiologist:  Wilbert Bihari, MD Cardiology APP:  Lelon Glendia DASEN, PA-C  Electrophysiologist:  Donnice DELENA Primus, MD     History of Present Illness Jesse Mcclure. is a 73 y.o. adult with persistent AF/typical AFL, Mobitz 1' AVB, HFpEF and HTN who presents back for further discussion on atrial arrhythmia management.   I last saw [name] on 07/10/2024 and at that time he was still having paroxysmal AF/AFL.  He had mild LA dilation on TTE on 06/24/2024.  He initially was diagnosed with typical atrial flutter on preop evaluation for total knee replacement on 06/27/2024.  He did not have obvious rhythm awareness at that time.   We had reviewed different options for rhythm management including rate control versus rhythm control with medications or ablation.  We discussed that management of AF is reasonable with either however typical atrial flutter is often more difficult to rhythm control with medications and ablation can be considered for first-line treatment.  He wanted to some time to discuss with his partner.   He was wearing a Zio patch during that time which showed 100% AF burden consistent with persistent AF with reasonable rate control with an average VR of 59.   I spoke with him over the phone and reviewed options again.    He is here today in clinic with his partner, [name].  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   On further discussion [name] has noticed more exertional fatigue and exercise limitation but was not sure what was causing this.  The timing of it does reflect his persistent AF/AFL.   ROS: DOE, fatigue    Studies Reviewed   ECG review 06/29/24: AF/SVR 53, QRS 86, QT/c 428/401 06/28/24: NSR 87, QRS 82, QT/c 392/471, Mobitz I 2' AVB 06/27/24: AFL/VR 60, QRS 64, QT/c 292/292   Zio AT Result  date: 07/02/24-07/13/24 2 Ventricular Tachycardia runs occurred, the run with the fastest interval lasting 8 beats with a max rate of 190 bpm (avg 173 bpm); the run with the fastest interval was also the longest. Atrial Fibrillation occurred continuously (100% burden), ranging  from 33-107 bpm (avg of 59 bpm). Isolated VEs were rare (<1.0%, 5298), VE Couplets were rare (<1.0%, 53), and VE Triplets were rare (<1.0%, 7). Ventricular Trigeminy was present. Previously noptified: MD notification criteria for First Documentation of  Atrial Fibrillation met - report posted prior to notification (NAR), and MD notification criteria for Slow Atrial Fibrillation met - report posted prior to notification (VA).   TTE Result date: 06/28/24  1. Left ventricular ejection fraction, by estimation, is 70 to 75%. The  left ventricle has hyperdynamic function. The left ventricle has no  regional wall motion abnormalities. Left ventricular diastolic parameters  are indeterminate.   2. Right ventricular systolic function is normal. The right ventricular  size is normal. There is mildly elevated pulmonary artery systolic  pressure.   3. Left atrial size was mildly dilated.   4. A small pericardial effusion is present.   5. The mitral valve is normal in structure. No evidence of mitral valve  regurgitation. No evidence of mitral stenosis.   6. The aortic valve is tricuspid. Aortic valve regurgitation is not  visualized. Aortic valve sclerosis is present, with no evidence of aortic  valve stenosis.   7. The inferior vena cava is  normal in size with greater than 50%  respiratory variability, suggesting right atrial pressure of 3 mmHg.    Risk Assessment/Calculations   CHA2DS2-VASc Score = 3  This indicates a 3.2% annual risk of stroke. The patient's score is based upon: CHF History: 1 HTN History: 1 Diabetes History: 0 Stroke History: 0 Vascular Disease History: 0 Age Score: 1 Gender Score: 0   Physical  Exam BP (!) 147/84   Pulse (!) 54   Temp 97.6 F (36.4 C)   Resp 16   Ht 5' 9 (1.753 m)   Wt 103.4 kg   SpO2 95%   BMI 33.67 kg/m      Wt Readings from Last 3 Encounters:  08/14/24 238 lb 6.4 oz (108.1 kg)  07/21/24 240 lb 12.8 oz (109.2 kg)  07/13/24 240 lb 12.8 oz (109.2 kg)    GEN: Well nourished, well developed in no acute distress CARDIAC: irregular rhythm, rate controlled no murmurs, rubs, gallops RESPIRATORY:  Clear to auscultation without rales, wheezing or rhonchi  EXTREMITIES:  No edema; No deformity    ASSESSMENT AND PLAN Brennon Mcclure. is a 73 y.o. adult with persistent AF/typical AFL, Mobitz 1' AVB, HFpEF and HTN who presents back for further discussion on atrial arrhythmia management.   Persistent AF/AFL Typical AFL Mild LAE Reviewed different options for management of atrial arrhythmias again today with Mr. Rowser and his partner.  He does not have an obvious rhythm awareness but has noticed some dyspnea on exertion and exercise limitation that may be associated with his persistent AF/AFL.  We reviewed options including rhythm management with antiarrhythmic medications, rate control and ablation.  Also reviewed options for cardioversion if he needs additional time to consider antiarrhythmic medications versus ablation.  He would like to proceed with catheter ablation.  Initially had planned to start amiodarone  preprocedure however there may be an interaction with pomalidomide .  I reached out to his hematologist Dr. Lonn who felt that the combination of amiodarone  and pomalidomide  was reasonable if it was needed.  For now will avoid but could use post procedure if he has a ERAF.    Discussed treatment options today for AF including antiarrhythmic drug therapy and ablation. Discussed risks, recovery and likelihood of success with each treatment strategy. Risk, benefits, and alternatives to EP study and ablation for afib were discussed. These risks include but are  not limited to stroke, bleeding, vascular damage, tamponade, perforation, damage to the esophagus, lungs, phrenic nerve and other structures, worsening renal function, coronary vasospasm and death.  Discussed potential need for repeat ablation procedures and antiarrhythmic drugs after an initial ablation. The patient understands these risk and wishes to proceed.  We will therefore proceed with catheter ablation. Carto, ICE, anesthesia are requested for the procedure.    Pre procedure details: Procedure date: 09/12/24 Carto/ICE/GA-anesthesia, no CT scan pre procedure  Varipulse+ST/SF D/F RF ablation for CTI  Take Xarelto  09/11/24 at night, hold all 09/12/24 AM medications    Dispo: RTC post ablation

## 2024-09-12 NOTE — Anesthesia Postprocedure Evaluation (Signed)
 Anesthesia Post Note  Patient: Jesse Mcclure.  Procedure(s) Performed: A-FLUTTER ABLATION     Patient location during evaluation: PACU Anesthesia Type: General Level of consciousness: awake and alert Pain management: pain level controlled Vital Signs Assessment: post-procedure vital signs reviewed and stable Respiratory status: spontaneous breathing, nonlabored ventilation, respiratory function stable and patient connected to nasal cannula oxygen Cardiovascular status: blood pressure returned to baseline and stable Postop Assessment: no apparent nausea or vomiting Anesthetic complications: no   There were no known notable events for this encounter.  Last Vitals:  Vitals:   09/12/24 1344 09/12/24 1403  BP: (!) 141/83 (!) 148/83  Pulse: (!) 53 66  Resp: (!) 22 17  Temp:    SpO2: 94% 94%    Last Pain:  Vitals:   09/12/24 1250  PainSc: 0-No pain                 Cordella P Tomi Grandpre

## 2024-09-12 NOTE — Interval H&P Note (Signed)
 History and Physical Interval Note:  09/12/2024 9:10 AM  Jesse Mcclure.  has presented today for surgery, with the diagnosis of afib.  The various methods of treatment have been discussed with the patient and family. After consideration of risks, benefits and other options for treatment, the patient has consented to  Procedure(s): ATRIAL FIBRILLATION ABLATION (N/A) A-FLUTTER ABLATION (N/A) as a surgical intervention.  The patient's history has been reviewed, patient examined, no change in status, stable for surgery.  I have reviewed the patient's chart and labs.  Questions were answered to the patient's satisfaction.    NSR today. LD Xarelto  12/08 PM. Will increase Xarelto  to 20 mg today and send new prescription (previous VTE dosing). Abx with recent R knee replacement. Plan for SDD.   Jesse Mcclure

## 2024-09-12 NOTE — Transfer of Care (Signed)
 Immediate Anesthesia Transfer of Care Note  Patient: Jesse Mcclure.  Procedure(s) Performed: ATRIAL FIBRILLATION ABLATION A-FLUTTER ABLATION  Patient Location: PACU  Anesthesia Type:General  Level of Consciousness: drowsy and patient cooperative  Airway & Oxygen Therapy: Patient connected to nasal cannula oxygen  Post-op Assessment: Report given to RN and Post -op Vital signs reviewed and stable  Post vital signs: Reviewed and stable  Last Vitals:  Vitals Value Taken Time  BP 140/90 1143  Temp    Pulse 65 1143  Resp 12 1143  SpO2 92 1143    Last Pain:  Vitals:   09/12/24 0759  PainSc: 0-No pain         Complications: There were no known notable events for this encounter.

## 2024-09-12 NOTE — Anesthesia Preprocedure Evaluation (Addendum)
 Anesthesia Evaluation  Patient identified by MRN, date of birth, ID band Patient awake    Reviewed: Allergy & Precautions, NPO status , Patient's Chart, lab work & pertinent test results  Airway Mallampati: II  TM Distance: >3 FB Neck ROM: Full    Dental no notable dental hx.    Pulmonary    Pulmonary exam normal        Cardiovascular hypertension, +CHF  + dysrhythmias Atrial Fibrillation  Rhythm:Regular Rate:Normal  MPRESSIONS      1. Left ventricular ejection fraction, by estimation, is 70 to 75%. The left ventricle has hyperdynamic function. The left ventricle has no regional wall motion abnormalities. Left ventricular diastolic parameters are indeterminate.  2. Right ventricular systolic function is normal. The right ventricular size is normal. There is mildly elevated pulmonary artery systolic pressure.  3. Left atrial size was mildly dilated.  4. A small pericardial effusion is present.  5. The mitral valve is normal in structure. No evidence of mitral valve regurgitation. No evidence of mitral stenosis.  6. The aortic valve is tricuspid. Aortic valve regurgitation is not visualized. Aortic valve sclerosis is present, with no evidence of aortic valve stenosis.  7. The inferior vena cava is normal in size with greater than 50% respiratory variability, suggesting right atrial pressure of 3 mmHg.      Neuro/Psych  negative psych ROS   GI/Hepatic negative GI ROS, Neg liver ROS,,,  Endo/Other  negative endocrine ROS    Renal/GU negative Renal ROS  negative genitourinary   Musculoskeletal  (+) Arthritis , Osteoarthritis,    Abdominal Normal abdominal exam  (+)   Peds  Hematology  (+) Blood dyscrasia, anemia Lab Results      Component                Value               Date                      WBC                      4.4                 08/21/2024                HGB                      13.9                 08/21/2024                HCT                      41.3                08/21/2024                MCV                      107.0 (H)           08/21/2024                PLT                      172  08/21/2024             Lab Results      Component                Value               Date                      NA                       140                 08/21/2024                K                        3.9                 08/21/2024                CO2                      24                  08/21/2024                GLUCOSE                  116 (H)             08/21/2024                BUN                      21                  08/21/2024                CREATININE               0.82                08/21/2024                CALCIUM                   9.3                 08/21/2024                EGFR                     92                  08/17/2024                GFRNONAA                 >60                 08/21/2024              Anesthesia Other Findings   Reproductive/Obstetrics                              Anesthesia Physical Anesthesia Plan  ASA: 3  Anesthesia Plan: General   Post-op Pain Management:  Induction: Intravenous  PONV Risk Score and Plan: 2 and Ondansetron , Dexamethasone  and Treatment may vary due to age or medical condition  Airway Management Planned: Mask and Oral ETT  Additional Equipment: None  Intra-op Plan:   Post-operative Plan: Extubation in OR  Informed Consent: I have reviewed the patients History and Physical, chart, labs and discussed the procedure including the risks, benefits and alternatives for the proposed anesthesia with the patient or authorized representative who has indicated his/her understanding and acceptance.     Dental advisory given  Plan Discussed with: CRNA  Anesthesia Plan Comments:         Anesthesia Quick Evaluation

## 2024-09-12 NOTE — Op Note (Signed)
   Procedure:  Intracardiac catheter ablation SVT w/ complex Dx (CPT [phone]) Intracardiac Echo (CPT [phone])  Pre-Op Diagnosis: persistent typical AFL, persistent AF  Post-Op Diagnosis: same   Procedure Date: 09/12/24   Attending: Adina Primus, MD   Anesthesia: GA  Initial Intervals: AF, QRS 70, QT 405, RR 703  Procedure: The patient entered the EP lab in a fasting nonsedated state. The surgical time-out was achieved. The patient was placed under GA by Anesthesiology. Once the patient was draped and prepped in normal fashion with multiple layers of Hibiclens  scrub in the bilateral groins, access to the veins was achieved in the following manner: Using ultrasound, the right groin was accessed with 3 standard access needles. One 8 Fr short sheath and two 7 Fr short sheaths were placed in the RFV. Perclose ProStyle sutures were used for preclosure of the venotomy sites. DCCV (200J x1) to NSR. ICE was advanced through the 9 Fr sheath to assess for LAA thrombus as the patient had been on reduced dose Xarelto  pre-procedure. There was a moderate circumferential LA effusion at baseline via imaging from the RA. No RV effusion at baseline. ICE was advanced to the RV and imaging of the LV with a moderate posterior effusion as well as a moderate LA circumferential effusion encompassing the LA and LAA. The decapolar CS catheter was placed. An 8.5 Fr Med curl Vizigo sheath was advanced to the mid RA through which a Thermocool ST SF D/F catheter was advanced to the mid RA.   Mapping: A 3-dimensional electroanatomic map was constructed of the right atrium, the coronary sinus, the tricuspid valve and the IVC using the Carto mapping system.  The valve annulus was marked. High density points of the cavo-tricuspid isthmus were taken.   Ablation: RF ablation was performed at 40W across the cavo-tricuspid isthmus which resulted bidirectional block. Pacing was performed from the proximal CS which demonstarted medial  to lateral block across the ablation line (155 ms). Pacing from the ablation catheter with measurement to the CSP was 150 ms confirming bidirectional block.   Transseptal access was not obtained and the PVI portion of the procedure was aborted with baseline LA/LAA and LV effusions.   Post-Ablation EPS: Differential pacing performed from the proximal coronary sinus and lateral right atrium confirmed bidirectional block across the ablation line.  Catheters and sheaths were pulled and hemostasis obtained with Perclose sutures. No complications were evident. ICE with stable effusions as above.    Final Intervals: NSR, AH 208, HV 48, PR 163, QRS 72, QT 410, RR 920  EBL: less than 50 mL  Complications: none, CTI RF ablation only, PVI aborted  Disposition: Same day discharge  Summary: AF on arrival, DCCV (200J x1) to NSR Successful CTI ablation with bidirectional block across CTI-line  ICE with baseline moderate circumferential LA/LAA effusion and moderate posterior LV effusion, larger than baseline in on 06/28/24 NSR with normal conduction intervals post-ablation    Recommendations: Bedrest x 2 hours Anti-coagulation: Resume Xarelto  20 mg at 1400, increase OP dose from 10 mg at bedtime to 20 mg at bedtime (prescription sent)  TTE prior to discharge to document size of pericardial effusion, can consider repeat attempt at [facility] in the future if effusion stable on higher dose Xarelto   Anti-platelet: none  Anti-arrhythmic: none  Rate control: none  EP f/u to be scheduled  Donnice DELENA Primus, MD [facility]  Cardiac Electrophysiology

## 2024-09-12 NOTE — Anesthesia Procedure Notes (Signed)
 Procedure Name: Intubation Date/Time: 09/12/2024 9:40 AM  Performed by: Christopher Comings, CRNAPre-anesthesia Checklist: Patient identified, Emergency Drugs available, Suction available and Patient being monitored Patient Re-evaluated:Patient Re-evaluated prior to induction Oxygen Delivery Method: Circle system utilized Preoxygenation: Pre-oxygenation with 100% oxygen Induction Type: IV induction Ventilation: Mask ventilation without difficulty Laryngoscope Size: Mac and 4 Grade View: Grade II Tube type: Oral Tube size: 7.5 mm Number of attempts: 1 Airway Equipment and Method: Stylet and Oral airway Placement Confirmation: ETT inserted through vocal cords under direct vision, positive ETCO2 and breath sounds checked- equal and bilateral Secured at: 23 cm Tube secured with: Tape Dental Injury: Teeth and Oropharynx as per pre-operative assessment

## 2024-09-12 NOTE — Discharge Instructions (Signed)

## 2024-09-12 NOTE — Progress Notes (Signed)
 Patient and patient partner given discharge instructions, education provided no further questions at this time. Patient able to ambulate and void before discharge. Able to tolerate PO intake. Patient site is clean, dry, intact with no hematoma noted upon discharge. Seen by MD, verified when to restart blood thinner, verified MD has reviewed Echo.

## 2024-09-13 ENCOUNTER — Other Ambulatory Visit (HOSPITAL_COMMUNITY): Payer: Self-pay

## 2024-09-13 ENCOUNTER — Encounter (HOSPITAL_COMMUNITY): Payer: Self-pay | Admitting: Student in an Organized Health Care Education/Training Program

## 2024-09-13 ENCOUNTER — Telehealth (HOSPITAL_COMMUNITY): Payer: Self-pay

## 2024-09-13 MED FILL — Cefazolin Sodium-Dextrose IV Solution 2 GM/100ML-4%: INTRAVENOUS | Qty: 100 | Status: AC

## 2024-09-13 MED FILL — Fentanyl Citrate Preservative Free (PF) Inj 100 MCG/2ML: INTRAMUSCULAR | Qty: 2 | Status: AC

## 2024-09-13 NOTE — Telephone Encounter (Addendum)
 Attempted to reach patient to follow up with procedure completed on 09/12/24, no answer. Left VM for patient to return call.Jesse Mcclure

## 2024-09-13 NOTE — Telephone Encounter (Signed)
 Spoke with patient to complete post procedure follow up call.  Patient reports no complications with groin sites.   Instructions reviewed with patient:  Remove large bandage at puncture site after 24 hours. It is normal to have bruising, tenderness, mild swelling, and a pea or marble sized lump/knot at the groin site which can take up to three months to resolve.  Get help right away if you notice sudden swelling at the puncture site.  Check your puncture site every day for signs of infection: fever, redness, swelling, pus drainage, warmth, foul odor or excessive pain. If this occurs, please call [phone], to speak with the RN Navigator. Get help right away if your puncture site is bleeding and the bleeding does not stop after applying firm pressure to the area.  You may continue to have skipped beats/ atrial flutter during the first several months after your procedure.  It is very important not to miss any doses of your blood thinner Xarelto .    You will follow up with the APP 4 weeks after your procedure and follow up with the APP 3 months after your procedure.  Activity restrictions reviewed.  Patient verbalized understanding to all instructions provided.

## 2024-09-14 ENCOUNTER — Other Ambulatory Visit (HOSPITAL_COMMUNITY): Payer: Self-pay

## 2024-09-15 ENCOUNTER — Other Ambulatory Visit (HOSPITAL_COMMUNITY): Payer: Self-pay

## 2024-09-18 ENCOUNTER — Telehealth: Payer: Self-pay | Admitting: Student in an Organized Health Care Education/Training Program

## 2024-09-18 DIAGNOSIS — I441 Atrioventricular block, second degree: Secondary | ICD-10-CM

## 2024-09-18 NOTE — Telephone Encounter (Signed)
° °  Pre-operative Risk Assessment    Patient Name: Jesse Mcclure.  DOB: Aug 25, 1951 MRN: [id number]   Date of last office visit: 08/17/24 Date of next office visit: 09/19/24   Request for Surgical Clearance    Procedure:  Periodontal cleaning  Date of Surgery:  Clearance 09/18/24                                Surgeon:  Dr. Henri Socks Group or Practice Name:  [facility] Phone number:  [phone] Fax number:     Type of Clearance Requested:   - Medical    Type of Anesthesia:  None    Additional requests/questions:     SignedBarbee DELENA Sharps   09/18/2024, 9:42 AM

## 2024-09-18 NOTE — Progress Notes (Unsigned)
 OFFICE NOTE:    Date:  09/19/2024  ID:  Jesse JINNY Percy Mickey., DOB 1951-08-07, MRN [id number] PCP: [name], MD  [facility] Providers Cardiologist:  Wilbert Bihari, MD Cardiology APP:  Lelon Glendia DASEN, PA-C  Electrophysiologist:  Donnice DELENA Primus, MD        (HFpEF) heart failure with preserved ejection fraction  TTE 05/17/23:  EF 60-65, no RWMA, mild LVH, NL RVSF, severe LAE, RAP 3  TTE 06/28/2024: EF 70-75, no RWMA, normal RVSF, mildly elevated PASP, mild LAE, small pericardial effusion, AV sclerosis, RVSP 43.4 TTE 09/12/2024: EF 65-70, no RWMA, mildly reduced RVSF, moderate RVE, moderately elevated PASP, RVSP 53.9, mild RAE, small pericardial effusion Atrial fibrillation/Flutter S/p CTI ablation 09/2024 (Dr. Primus) Consider PVI ablation if no change in pericardial effusion on high dose Xarelto   Pericardial effusion Pulmonary hypertension  Hypertension  Hyperlipidemia Aortic atherosclerosis  Multiple myeloma, metastatic  Hemochromatosis  Hx of pulmonary embolism  Hx of polio         Discussed the use of AI scribe software for clinical note transcription with the patient, who gave verbal consent to proceed. History of Present Illness Jesse Mcclure. is a 73 y.o. adult for follow up of CHF. He was last seen in 09/2023. He was initially seen in 06/2023 during an admission with (HFpEF) heart failure with preserved ejection fraction. Chemotherapy for multiple myeloma was paused during that time but later resumed. He underwent R knee replacement in 06/2024 and was noted to be in AFlutter. He converted to NSR with Mobitz 1. Xarelto  was increased to 20 mg once daily.  He saw Dr. Primus for EP consultation.  He had a follow-up monitor that demonstrated 100% atrial fibrillation burden.  He was ultimately set up for CTI ablation for atrial flutter which was performed 09/12/2024.  He was noted to have pericardial effusion on intracardiac echo.  Follow-up transthoracic echo  demonstrated small pericardial effusion without tamponade physiology.  It was felt that PVI ablation could be attempted in the future if his effusion does not get larger on higher dose Xarelto . Prior to ablation for atrial flutter, he was seen in the emergency room 08/21/2024 with chest discomfort.  Troponins were minimally elevated and flat.  BNP was significantly elevated and chest x-ray suggested acute CHF.  He was given IV Lasix  with improved symptoms.   He feels better and has more energy since the atrial flutter ablation, with no shortness of breath or swelling. No difficulty breathing when lying flat. He he thinks he is taking Jardiance  20 mg instead of 10 mg once daily.      ROS-See HPI    Studies Reviewed:      LABS 05/09/24: TC 140, HDL 51, Trig 122, LDL 67 06/27/24: TSH 1.78 08/21/24: K 3.9, SCr 0.82, eGFR > 60, Hgb 13.9, PLT 172K 08/21/24: hsTrop T 21 - 22; Pro BNP 1491  RADIOLOGY 08/21/24: CXR smal bilat effusions, pulm vasc congestion w/o frank edema  Risk Assessment/Calculations: CHA2DS2-VASc Score = 3   This indicates a 3.2% annual risk of stroke. The patient's score is based upon: CHF History: 1 HTN History: 1 Diabetes History: 0 Stroke History: 0 Vascular Disease History: 0 Age Score: 1 Gender Score: 0            Physical Exam:  VS:  BP 120/70 (BP Location: Left Arm, Patient Position: Sitting, Cuff Size: Large)   Pulse 72   Resp 16   Ht 5' 9 (1.753 m)  Wt 225 lb 12.8 oz (102.4 kg)   SpO2 97%   BMI 33.34 kg/m        Wt Readings from Last 3 Encounters:  09/19/24 225 lb 12.8 oz (102.4 kg)  09/12/24 228 lb (103.4 kg)  08/21/24 238 lb 5.1 oz (108.1 kg)    Constitutional:      Appearance: Healthy appearance. Not in distress.  Neck:     Vascular: No JVR. JVD normal.  Pulmonary:     Breath sounds: Normal breath sounds. No wheezing. No rales.  Cardiovascular:     Normal rate. Regular rhythm.     Murmurs: There is no murmur.  Edema:    Ankle:  bilateral trace edema of the ankle. Abdominal:     Palpations: Abdomen is soft.       Assessment and Plan:    Assessment & Plan Chronic heart failure with preserved ejection fraction (HFpEF) (HCC) He was admitted in 06/2023 with acute CHF and recently went to the ED in 08/2024 with acute CHF. Of note, he was in AFib at that time with controlled rate. He is now s/p CTI ablation for AFlutter. He seems to be in NSR on exam today. Overall, he feels much better. He describes NYHA II symptoms and his volume status seems stable. He is taking Jardiance  20 mg once daily. I have explained to him that there is no guideline directed recommendation for a dose of Jardiance  higher than 10 mg for CHF. He does not have diabetes. We discussed +/- starting MRA with Spironolactone. At this point, I think we can hold off. His most recent CHF exacerbation was likely related to AFib/Flutter.  - I asked pt to make sure he is only taking Jardiance  10 mg once daily - Continue Furosemide  20 mg once daily  - Follow up in April 2026 with me or Dr. Shlomo. Other secondary pulmonary hypertension (HCC) Moderately elevated PASP on most recent echocardiogram 09/2024. I suspect this is Group 2, related to L heart disease with HFpEF. Hopefully, this will improve with maintaining NSR.  - Repeat limited TTE in 12/2024.  - If PASP is higher, will arrange PFTs, VQ scan and refer to CHF Clinic for evaluation.  Atrial fibrillation/flutter [facility]) S/p CTI ablation for AFlutter in 09/2024. He was noted to have a pericardial effusion on ICE. Follow up TTE showed mild pericardial effusion which was also noted in 06/2024. The procedure note from Dr. Almetta outlined possibly pursuing PVI ablation in the future if he does not have enlarging effusion on full dose Xarelto . He has follow up with Daphne Barrack, NP with EP in March.  - Repeat limited TTE in 12/2024 to recheck effusion.  Pericardial effusion Noted on echocardiogram in 06/2024, during CTI  ablation for AFlutter on ICE and recent TTE in 09/2024. I reviewed his prior echocardiograms. He has had a mild pericardial effusion dating back to 2022. I think this is probably chronic and will not likely change. I will repeat a limited TTE in 12/2024 as noted as this will help with planning of +/- PVI ablation.  Multiple myeloma without remission (HCC) Pt is being followed by oncology and is currently treated with Ixazomib and Pomalidomide . There are no concerns for cardiotoxicity. On review, events such as AFib, angina, CHF have been reported in patients who received Pomalidomide  and dexamethasone  in 2 clinical trials. It looks like he has been off of dexamethasone  since last year. At this time, I do not think there are any concerns for his chemotherapy  contributing to CHF, AFib.          Dispo:  Return in about 4 months (around 01/18/2025) for Routine Follow Up, w/ Glendia Ferrier, PA-C.  Signed, Glendia Ferrier, PA-C

## 2024-09-18 NOTE — Assessment & Plan Note (Signed)
 He was admitted in 06/2023 with acute CHF and recently went to the ED in 08/2024 with acute CHF. Of note, he was in AFib at that time with controlled rate. He is now s/p CTI ablation for AFlutter. He seems to be in NSR on exam today. Overall, he feels much better. He describes NYHA II symptoms and his volume status seems stable. He is taking Jardiance  20 mg once daily. I have explained to him that there is no guideline directed recommendation for a dose of Jardiance  higher than 10 mg for CHF. He does not have diabetes. We discussed +/- starting MRA with Spironolactone. At this point, I think we can hold off. His most recent CHF exacerbation was likely related to AFib/Flutter.  - I asked pt to make sure he is only taking Jardiance  10 mg once daily - Continue Furosemide  20 mg once daily  - Follow up in April 2026 with me or Dr. Shlomo.

## 2024-09-18 NOTE — Telephone Encounter (Signed)
° °  Patient Name: Jesse Mcclure.  DOB: [birth year] MRN: [id number]  Primary Cardiologist: Wilbert Bihari, MD  Chart reviewed as part of pre-operative protocol coverage.   IF SIMPLE EXTRACTION/CLEANINGS: Simple dental extractions (i.e. 1-2 teeth) are considered low risk procedures per guidelines and generally do not require any specific cardiac clearance. It is also generally accepted that for simple extractions and dental cleanings, there is no need to interrupt blood thinner therapy.   SBE prophylaxis is not required for the patient from a cardiac standpoint.  I will route this recommendation to the requesting party via Epic fax function and remove from pre-op pool.  Please call with questions.  Lamarr Satterfield, NP 09/18/2024, 9:56 AM

## 2024-09-19 ENCOUNTER — Encounter: Payer: Self-pay | Admitting: Physician Assistant

## 2024-09-19 ENCOUNTER — Ambulatory Visit: Attending: Internal Medicine | Admitting: Physician Assistant

## 2024-09-19 VITALS — BP 120/70 | HR 72 | Resp 16 | Ht 69.0 in | Wt 225.8 lb

## 2024-09-19 DIAGNOSIS — I3139 Other pericardial effusion (noninflammatory): Secondary | ICD-10-CM | POA: Diagnosis present

## 2024-09-19 DIAGNOSIS — C9000 Multiple myeloma not having achieved remission: Secondary | ICD-10-CM | POA: Diagnosis present

## 2024-09-19 DIAGNOSIS — I5032 Chronic diastolic (congestive) heart failure: Secondary | ICD-10-CM | POA: Insufficient documentation

## 2024-09-19 DIAGNOSIS — I2729 Other secondary pulmonary hypertension: Secondary | ICD-10-CM

## 2024-09-19 DIAGNOSIS — I4892 Unspecified atrial flutter: Secondary | ICD-10-CM | POA: Diagnosis present

## 2024-09-19 DIAGNOSIS — I4891 Unspecified atrial fibrillation: Secondary | ICD-10-CM

## 2024-09-19 NOTE — Patient Instructions (Signed)
 Medication Instructions:  Be sure that you are only taking jardiance  10 mg once daily *If you need a refill on your cardiac medications before your next appointment, please call your pharmacy*  Lab Work: None If you have labs (blood work) drawn today and your tests are completely normal, you will receive your results only by: MyChart Message (if you have MyChart) OR A paper copy in the mail If you have any lab test that is abnormal or we need to change your treatment, we will call you to review the results.  Testing/Procedures: Your physician has requested that you have an echocardiogram in March 2026. Echocardiography is a painless test that uses sound waves to create images of your heart. It provides your doctor with information about the size and shape of your heart and how well your hearts chambers and valves are working. This procedure takes approximately one hour. There are no restrictions for this procedure. Please do NOT wear cologne, perfume, aftershave, or lotions (deodorant is allowed). Please arrive 15 minutes prior to your appointment time.  Please note: We ask at that you not bring children with you during ultrasound (echo/ vascular) testing. Due to room size and safety concerns, children are not allowed in the ultrasound rooms during exams. Our front office staff cannot provide observation of children in our lobby area while testing is being conducted. An adult accompanying a patient to their appointment will only be allowed in the ultrasound room at the discretion of the ultrasound technician under special circumstances. We apologize for any inconvenience.   Follow-Up: At [facility], you and your health needs are our priority.  As part of our continuing mission to provide you with exceptional heart care, our providers are all part of one team.  This team includes your primary Cardiologist (physician) and Advanced Practice Providers or APPs (Physician Assistants and  Nurse Practitioners) who all work together to provide you with the care you need, when you need it.  Your next appointment:   4 month(s)  Provider:   Glendia Ferrier, PA-C

## 2024-09-19 NOTE — Assessment & Plan Note (Addendum)
 Pt is being followed by oncology and is currently treated with Ixazomib and Pomalidomide . There are no concerns for cardiotoxicity. On review, events such as AFib, angina, CHF have been reported in patients who received Pomalidomide  and dexamethasone  in 2 clinical trials. It looks like he has been off of dexamethasone  since last year. At this time, I do not think there are any concerns for his chemotherapy contributing to CHF, AFib.

## 2024-09-20 ENCOUNTER — Ambulatory Visit: Payer: Self-pay | Admitting: Physician Assistant

## 2024-09-26 ENCOUNTER — Other Ambulatory Visit: Payer: Self-pay | Admitting: Hematology and Oncology

## 2024-09-26 ENCOUNTER — Other Ambulatory Visit: Payer: Self-pay | Admitting: Hematology

## 2024-09-26 ENCOUNTER — Other Ambulatory Visit: Payer: Self-pay

## 2024-09-26 ENCOUNTER — Other Ambulatory Visit (HOSPITAL_COMMUNITY): Payer: Self-pay

## 2024-09-26 DIAGNOSIS — C9000 Multiple myeloma not having achieved remission: Secondary | ICD-10-CM

## 2024-09-26 MED ORDER — OXYCODONE HCL 5 MG PO TABS
5.0000 mg | ORAL_TABLET | ORAL | 0 refills | Status: DC | PRN
  Filled 2024-09-26: qty 90, 15d supply, fill #0

## 2024-09-26 MED ORDER — PROCHLORPERAZINE MALEATE 10 MG PO TABS
ORAL_TABLET | ORAL | 0 refills | Status: AC
  Filled 2024-09-26: qty 30, 7d supply, fill #0

## 2024-09-26 NOTE — Telephone Encounter (Signed)
 Communication Note  Patient called on 12/22 and 12/23 requesting refills for oxyCODONE  and Compazine .  Providers note reviewed indicating no refills authorized for the narcotic medication.  Patient has an appointment scheduled with Dr. KANDICE on 12/30.  Patient reported having zero medication remaining for both requested prescriptions.

## 2024-09-27 ENCOUNTER — Other Ambulatory Visit: Payer: Self-pay

## 2024-09-27 ENCOUNTER — Other Ambulatory Visit (HOSPITAL_COMMUNITY): Payer: Self-pay

## 2024-09-27 ENCOUNTER — Encounter: Payer: Self-pay | Admitting: Hematology and Oncology

## 2024-09-27 MED ORDER — POMALIDOMIDE 3 MG PO CAPS
ORAL_CAPSULE | ORAL | 0 refills | Status: DC
  Filled 2024-09-27: qty 21, fill #0

## 2024-09-27 MED ORDER — POMALIDOMIDE 3 MG PO CAPS: ORAL_CAPSULE | ORAL | 0 refills | Status: DC

## 2024-09-27 NOTE — Telephone Encounter (Signed)
 Authorization # [id number] for requeest to renew Pomalidomide 

## 2024-10-02 ENCOUNTER — Other Ambulatory Visit: Payer: Self-pay

## 2024-10-02 ENCOUNTER — Other Ambulatory Visit (HOSPITAL_COMMUNITY): Payer: Self-pay

## 2024-10-03 ENCOUNTER — Inpatient Hospital Stay

## 2024-10-03 ENCOUNTER — Inpatient Hospital Stay: Admitting: Hematology and Oncology

## 2024-10-03 ENCOUNTER — Encounter: Payer: Self-pay | Admitting: Hematology and Oncology

## 2024-10-03 ENCOUNTER — Inpatient Hospital Stay: Attending: Hematology and Oncology

## 2024-10-03 VITALS — BP 137/69 | HR 53 | Temp 97.8°F | Resp 18 | Ht 69.0 in | Wt 232.0 lb

## 2024-10-03 DIAGNOSIS — D701 Agranulocytosis secondary to cancer chemotherapy: Secondary | ICD-10-CM

## 2024-10-03 DIAGNOSIS — C9000 Multiple myeloma not having achieved remission: Secondary | ICD-10-CM | POA: Diagnosis not present

## 2024-10-03 DIAGNOSIS — T451X5A Adverse effect of antineoplastic and immunosuppressive drugs, initial encounter: Secondary | ICD-10-CM | POA: Insufficient documentation

## 2024-10-03 LAB — COMPREHENSIVE METABOLIC PANEL WITH GFR
ALT: 26 U/L (ref 0–44)
AST: 19 U/L (ref 15–41)
Albumin: 3.7 g/dL (ref 3.5–5.0)
Alkaline Phosphatase: 71 U/L (ref 38–126)
Anion gap: 8 (ref 5–15)
BUN: 21 mg/dL (ref 8–23)
CO2: 28 mmol/L (ref 22–32)
Calcium: 9.1 mg/dL (ref 8.9–10.3)
Chloride: 106 mmol/L (ref 98–111)
Creatinine, Ser: 1.08 mg/dL (ref 0.61–1.24)
GFR, Estimated: >60 mL/min
Glucose, Bld: 90 mg/dL (ref 70–99)
Potassium: 4.4 mmol/L (ref 3.5–5.1)
Sodium: 141 mmol/L (ref 135–145)
Total Bilirubin: 1 mg/dL (ref 0.0–1.2)
Total Protein: 6.7 g/dL (ref 6.5–8.1)

## 2024-10-03 LAB — CBC WITH DIFFERENTIAL/PLATELET
Abs Immature Granulocytes: 0.01 K/uL (ref 0.00–0.07)
Basophils Absolute: 0 K/uL (ref 0.0–0.1)
Basophils Relative: 1 %
Eosinophils Absolute: 0.2 K/uL (ref 0.0–0.5)
Eosinophils Relative: 5 %
HCT: 39.8 % (ref 39.0–52.0)
Hemoglobin: 13.2 g/dL (ref 13.0–17.0)
Immature Granulocytes: 0 %
Lymphocytes Relative: 43 %
Lymphs Abs: 1.3 K/uL (ref 0.7–4.0)
MCH: 34.9 pg — ABNORMAL HIGH (ref 26.0–34.0)
MCHC: 33.2 g/dL (ref 30.0–36.0)
MCV: 105.3 fL — ABNORMAL HIGH (ref 80.0–100.0)
Monocytes Absolute: 0.6 K/uL (ref 0.1–1.0)
Monocytes Relative: 20 %
Neutro Abs: 0.9 K/uL — ABNORMAL LOW (ref 1.7–7.7)
Neutrophils Relative %: 31 %
Platelets: 123 K/uL — ABNORMAL LOW (ref 150–400)
RBC: 3.78 MIL/uL — ABNORMAL LOW (ref 4.22–5.81)
RDW: 16 % — ABNORMAL HIGH (ref 11.5–15.5)
WBC: 3 K/uL — ABNORMAL LOW (ref 4.0–10.5)
nRBC: 0 % (ref 0.0–0.2)

## 2024-10-03 NOTE — Assessment & Plan Note (Addendum)
This is likely due to recent treatment. The patient denies recent history of fevers, cough, chills, diarrhea or dysuria. He is asymptomatic from the leukopenia. I will observe for now.  I will continue the chemotherapy at current dose without dosage adjustment.  If the leukopenia gets progressive worse in the future, I might have to delay his treatment or adjust the chemotherapy dose. 

## 2024-10-03 NOTE — Assessment & Plan Note (Addendum)
 Will continue to monitor ferritin once a year

## 2024-10-03 NOTE — Progress Notes (Signed)
 [facility] OFFICE PROGRESS NOTE  Patient Care Team: [name], MD as PCP - General (Family Medicine) Shlomo Wilbert SAUNDERS, MD as PCP - Cardiology (Cardiology) Almetta Donnice LABOR, MD as PCP - Electrophysiology (Cardiology) Lonn Hicks, MD as Consulting Physician (Hematology and Oncology) Lelon Glendia ONEIDA DEVONNA as Physician Assistant (Cardiology)  Assessment & Plan Multiple myeloma without remission [facility]) He has recurrent multiple myeloma, IgG kappa subtype, standard risk, well-maintained on current treatment with pomalidomide  and Ninlaro .   His recent treatment of combination of Pomalyst  and Ninlaro    He is tapered off dexamethasone  Recent myeloma panel show M protein is marginally elevated likely due to interruption of treatment, repeat level is pending and we will call him with test results We will continue treatment as prescribed He remains on Xarelto  for history of recurrent clots as well as DVT prophylaxis He will continue acyclovir  He is on calcium  and vitamin D  He received Zometa  in September, next dose will be in next year I will continue treatment without changes Leukopenia due to antineoplastic chemotherapy This is likely due to recent treatment. The patient denies recent history of fevers, cough, chills, diarrhea or dysuria. He is asymptomatic from the leukopenia. I will observe for now.  I will continue the chemotherapy at current dose without dosage adjustment.  If the leukopenia gets progressive worse in the future, I might have to delay his treatment or adjust the chemotherapy dose.  Hemochromatosis, hereditary Will continue to monitor ferritin once a year  No orders of the defined types were placed in this encounter.    Hicks Lonn, MD  INTERVAL HISTORY: he returns for treatment follow-up for multiple myeloma, recurrent DVT and hemochromatosis He is taking his medications as instructed Denies new bone pain or recent infection Complications related  to previous cycle of chemotherapy included pancytopenia, and bone aches,  PHYSICAL EXAMINATION: ECOG PERFORMANCE STATUS: 0 - Asymptomatic  Lab Results  Component Value Date   IGGSERUM 1,371 08/14/2024   TOTALPROTELP 6.3 08/14/2024   MPROTEIN 0.8 (H) 08/14/2024   IFE Comment (A) 08/14/2024   KPAFRELGTCHN 99.2 (H) 08/14/2024   LAMBDASER 22.2 08/14/2024   KAPLAMBRATIO 4.47 (H) 08/14/2024   KAPLAMBRATIO 3.51 (H) 07/13/2024   KAPLAMBRATIO 2.90 (H) 05/02/2024      Latest Ref Rng & Units 10/03/2024   10:09 AM 08/21/2024    6:04 AM 08/17/2024   12:06 PM  CBC  WBC 4.0 - 10.5 K/uL 3.0  4.4  3.6   Hemoglobin 13.0 - 17.0 g/dL 86.7  86.0  86.7   Hematocrit 39.0 - 52.0 % 39.8  41.3  41.2   Platelets 150 - 400 K/uL 123  172  187       Chemistry      Component Value Date/Time   NA 141 10/03/2024 1009   NA 141 08/17/2024 1206   NA 139 04/05/2014 0849   K 4.4 10/03/2024 1009   K 4.4 04/05/2014 0849   CL 106 10/03/2024 1009   CO2 28 10/03/2024 1009   CO2 23 04/05/2014 0849   BUN 21 10/03/2024 1009   BUN 16 08/17/2024 1206   BUN 16.8 04/05/2014 0849   CREATININE 1.08 10/03/2024 1009   CREATININE 0.80 10/29/2022 1046   CREATININE 0.91 07/29/2017 1004   CREATININE 0.9 04/05/2014 0849   GLU 10 04/10/2014 0000      Component Value Date/Time   CALCIUM  9.1 10/03/2024 1009   CALCIUM  8.7 04/05/2014 0849   ALKPHOS 71 10/03/2024 1009   ALKPHOS 75  04/05/2014 0849   AST 19 10/03/2024 1009   AST 24 10/29/2022 1046   AST 26 04/05/2014 0849   ALT 26 10/03/2024 1009   ALT 31 10/29/2022 1046   ALT 30 04/05/2014 0849   BILITOT 1.0 10/03/2024 1009   BILITOT 0.9 06/01/2023 1115   BILITOT 0.9 10/29/2022 1046   BILITOT 0.71 04/05/2014 0849       Vitals:   10/03/24 1027  BP: 137/69  Pulse: (!) 53  Resp: 18  Temp: 97.8 F (36.6 C)  SpO2: 97%   Filed Weights   10/03/24 1027  Weight: 232 lb (105.2 kg)   Other relevant data reviewed during this visit included CBC, CMP

## 2024-10-03 NOTE — Assessment & Plan Note (Addendum)
 He has recurrent multiple myeloma, IgG kappa subtype, standard risk, well-maintained on current treatment with pomalidomide  and Ninlaro .   His recent treatment of combination of Pomalyst  and Ninlaro    He is tapered off dexamethasone  Recent myeloma panel show M protein is marginally elevated likely due to interruption of treatment, repeat level is pending and we will call him with test results We will continue treatment as prescribed He remains on Xarelto  for history of recurrent clots as well as DVT prophylaxis He will continue acyclovir  He is on calcium  and vitamin D  He received Zometa  in September, next dose will be in next year I will continue treatment without changes

## 2024-10-04 LAB — KAPPA/LAMBDA LIGHT CHAINS
Kappa free light chain: 120.8 mg/L — ABNORMAL HIGH (ref 3.3–19.4)
Kappa, lambda light chain ratio: 5.42 — ABNORMAL HIGH (ref 0.26–1.65)
Lambda free light chains: 22.3 mg/L (ref 5.7–26.3)

## 2024-10-06 LAB — MULTIPLE MYELOMA PANEL, SERUM
Albumin SerPl Elph-Mcnc: 3 g/dL (ref 2.9–4.4)
Albumin/Glob SerPl: 1 (ref 0.7–1.7)
Alpha 1: 0.2 g/dL (ref 0.0–0.4)
Alpha2 Glob SerPl Elph-Mcnc: 0.8 g/dL (ref 0.4–1.0)
B-Globulin SerPl Elph-Mcnc: 0.8 g/dL (ref 0.7–1.3)
Gamma Glob SerPl Elph-Mcnc: 1.3 g/dL (ref 0.4–1.8)
Globulin, Total: 3.1 g/dL (ref 2.2–3.9)
IgA: 109 mg/dL (ref 61–437)
IgG (Immunoglobin G), Serum: 1454 mg/dL (ref 603–1613)
IgM (Immunoglobulin M), Srm: 65 mg/dL (ref 15–143)
M Protein SerPl Elph-Mcnc: 1 g/dL — ABNORMAL HIGH
Total Protein ELP: 6.1 g/dL (ref 6.0–8.5)

## 2024-10-09 ENCOUNTER — Telehealth: Payer: Self-pay

## 2024-10-09 NOTE — Telephone Encounter (Signed)
Called and given below message. He verbalized understanding. 

## 2024-10-09 NOTE — Telephone Encounter (Signed)
-----   Message from Almarie Bedford, MD sent at 10/09/2024  8:51 AM EST ----- Call Bill, his M protein is at 1.0 now I plan to continue his treatment as scheduled now, will discuss options with him end of the month as scheduled

## 2024-10-10 ENCOUNTER — Ambulatory Visit (HOSPITAL_COMMUNITY): Admitting: Nurse Practitioner

## 2024-10-10 DIAGNOSIS — C9000 Multiple myeloma not having achieved remission: Secondary | ICD-10-CM

## 2024-10-16 NOTE — H&P (View-Only) (Signed)
 "  Primary Care Physician: [name], MD Primary Cardiologist: Wilbert Bihari, MD Electrophysiologist: Donnice DELENA Primus, MD  Referring Physician: Donnice DELENA Primus, MD   Elsie PARAS Cleto Claggett. is a 74 y.o. adult with a history of atrial flutter s/p CTI ablation by Dr. Primus 09/2024, pulmonary HTN, Mobitz 1 AVB, HFpEF, PE, HLD, HTN, hemochromatosis, multiple myeloma (without remission), who presents for follow up in the [facility].  The patient was initially diagnosed with atrial flutter during preop evaluation for total knee procedure on 06/27/2024.  He was referred to Dr. Primus and evaluated on 07/10/2024 while wearing a 2-week ZIO monitor with plan to pursue ablation if arrhythmia became more frequent.  During visit he was still experiencing episodes of atrial flutter and was seen in follow-up on 08/17/2024 with patient agreeing to pursue ablation.  He underwent CTI ablation by Dr. Primus on 09/12/2024 and was noted to have pericardial effusion with TTE completed prior to discharge showing EF of 65-70% with no RWMA and small pericardial effusion present with no evidence of tamponade.  Dr. Primus reported possibly pursuing PVI ablation in the future if effusion does not increase in size.  He had Xarelto  increased from 10 mg to 20 mg at discharge.  He was seen by Glendia Ferrier, PA on 09/19/2024 and was maintaining sinus rhythm on exam with limited TTE scheduled for 12/2024 to recheck effusion.  Mr. Santilli presents today for post ablation follow-up.  On exam today he is fortunately back in atrial flutter with controlled rate of 60 bpm.  He has been asymptomatic but does note some shortness of breath with exertion.  He was recently in [place] on vacation and reported some indiscretion with wine but denied any overindulgence. He also reports eating some salty foods and does have some pitting edema on exam.  He reports that his groin sites healed without any complications  and he has been compliant with his Xarelto  with no missed doses. He is also tolerated the increased dose from 10 mg to 20 mg since his ablation procedure. we also discussed the importance of medication compliance and possible recurrence of atrial flutters/fibrillation during the 13-month blanking period.  He has also never been screened for sleep apnea and is in favor of pursuing a sleep study which will be discussed at subsequent follow-ups.  He does own a Kardia mobile but is having difficulty linking it to his phone. I advised him to monitor his heart rate manually and also check his BP to determine if out of rhythm.  He is planning to go to the [employer] store for assistance and getting his cardia mobile into his phone.  Today, he denies symptoms of palpitations, chest pain, shortness of breath, orthopnea, PND, lower extremity edema, dizziness, presyncope, syncope, snoring, daytime somnolence, bleeding, or neurologic sequela. The patient is tolerating medications without difficulties and is otherwise without complaint today.   Atrial Fibrillation Management history: Possible history of sleep apnea but not formally tested in the past Previous antiarrhythmic drugs: None Previous cardioversions: None Previous ablations: CTI ablation 09/12/2024 Anticoagulation history: Xarelto  pomalidomide  managed by hematology  ROS- All systems are reviewed and negative except as per the HPI above.  Past Medical History:  Diagnosis Date   Arthritis    Blood dyscrasia    hemachromatosis   Bone loss of mandible    BPH (benign prostatic hypertrophy)    CHF (congestive heart failure) (HCC)    Degenerative disc disease    Diverticulosis  Hemochromatosis    HTN (hypertension)    Hyperlipidemia    Multiple myeloma (HCC)    Neuromuscular disorder (HCC)    Occasional tremors    takes Primadone   Polio    as a child   Pulmonary embolism (HCC) 07/2021   after  Left TKA   Status post Nissen fundoplication  (without gastrostomy tube) procedure    for hiatal hernia.    Current Outpatient Medications  Medication Sig Dispense Refill   acetaminophen  (TYLENOL ) 500 MG tablet Take 500-1,000 mg by mouth every 6 (six) hours as needed for mild pain (pain score 1-3) or moderate pain (pain score 4-6).     acyclovir  (ZOVIRAX ) 400 MG tablet Take 1 tablet (400 mg total) by mouth daily. 90 tablet 1   atorvastatin  (LIPITOR) 20 MG tablet Take 1 tablet (20 mg total) by mouth daily. 90 tablet 3   calcium  carbonate (TUMS - DOSED IN MG ELEMENTAL CALCIUM ) 500 MG chewable tablet Chew 1 tablet by mouth 2 (two) times daily as needed for heartburn.     Cholecalciferol  (VITAMIN D ) 50 MCG (2000 UT) tablet Take 2,000 Units by mouth daily.     empagliflozin  (JARDIANCE ) 10 MG TABS tablet Take 1 tablet (10 mg total) by mouth daily. (Patient taking differently: Take 20 mg by mouth daily. Patient reported taking 2 tablets) 90 tablet 3   folic acid  (FOLVITE ) 400 MCG tablet Take 400 mcg by mouth daily.     furosemide  (LASIX ) 20 MG tablet Take 1 tablet (20 mg total) by mouth daily. 90 tablet 1   losartan  (COZAAR ) 100 MG tablet Take 1 tablet (100 mg total) by mouth daily. 90 tablet 3   Multiple Vitamin (MULTI-VITAMIN) tablet Take 1 tablet by mouth daily.     mupirocin  ointment (BACTROBAN ) 2 % Apply 1 Application topically 2 (two) times daily. 22 g 2   NINLARO  3 MG capsule TAKE 1 CAPSULE BY MOUTH WEEKLY, 3 WEEKS ON, 1 WEEK OFF, REPEAT EVERY 4 WEEKS. TAKE ON AN EMPTY STOMACH 1 HOUR BEFORE OR 2 HOUR AFTER MEALS. 3 capsule 5   ondansetron  (ZOFRAN ) 4 MG tablet Take 1 tablet (4 mg total) by mouth every 8 (eight) hours as needed for nausea or vomiting. 10 tablet 0   oxyCODONE  (OXY IR/ROXICODONE ) 5 MG immediate release tablet Take 1 tablet (5 mg total) by mouth every 4 (four) hours as needed for severe pain (pain score 7-10). 90 tablet 0   Polyethyl Glycol-Propyl Glycol (SYSTANE OP) Place 1 drop into both eyes daily as needed (dry eyes).      pomalidomide  (POMALYST ) 3 MG capsule [employer] Auth #   [id number]   Date Obtained 09/27/2024 21 capsule 0   prochlorperazine  (COMPAZINE ) 10 MG tablet TAKE 1 TABLET(10 MG) BY MOUTH EVERY 6 HOURS AS NEEDED FOR NAUSEA OR VOMITING 30 tablet 0   rivaroxaban  (XARELTO ) 20 MG TABS tablet Take 1 tablet (20 mg total) by mouth daily with supper. 90 tablet 3   sennosides-docusate sodium  (SENOKOT-S) 8.6-50 MG tablet Take 2 tablets by mouth daily. 30 tablet 1   vitamin B-12 (CYANOCOBALAMIN ) 1000 MCG tablet Take 1,000 mcg by mouth daily.     No current facility-administered medications for this visit.    Physical Exam: There were no vitals taken for this visit.  GEN: Well nourished, well developed in no acute distress NECK: No JVD; No carotid bruits CARDIAC: Irregularly irregular rate and rhythm, no murmurs, rubs, gallops RESPIRATORY:  Clear to auscultation without rales, wheezing or rhonchi  ABDOMEN:  Soft, non-tender, non-distended EXTREMITIES:  No edema; No deformity   Wt Readings from Last 3 Encounters:  10/03/24 105.2 kg  09/19/24 102.4 kg  09/12/24 103.4 kg     EKG today demonstrates:   EKG Interpretation Date/Time:    Ventricular Rate:    PR Interval:    QRS Duration:    QT Interval:    QTC Calculation:   R Axis:      Text Interpretation:          Echo Completed 09/12/2024: 1. Left ventricular ejection fraction, by estimation, is 65 to 70%. The  left ventricle has normal function. The left ventricle has no regional  wall motion abnormalities. Left ventricular diastolic parameters were  normal.   2. Right ventricular systolic function is mildly reduced. The right  ventricular size is moderately enlarged. There is moderately elevated  pulmonary artery systolic pressure.   3. Right atrial size was mildly dilated.   4. A small pericardial effusion is present. The pericardial effusion is  circumferential. There is no evidence of cardiac tamponade.   5. The mitral valve is normal in  structure. No evidence of mitral valve  regurgitation. No evidence of mitral stenosis.   6. The aortic valve is normal in structure. Aortic valve regurgitation is  not visualized. No aortic stenosis is present.   7. The inferior vena cava is dilated in size with <50% respiratory  variability, suggesting right atrial pressure of 15 mmHg.   CHA2DS2-VASc Score = 3  The patient's score is based upon: CHF History: 1 HTN History: 1 Diabetes History: 0 Stroke History: 0 Vascular Disease History: 0 Age Score: 1 Gender Score: 0       ASSESSMENT AND PLAN: Paroxysmal Atrial Flutter (ICD10:  I48.0) The patient's CHA2DS2-VASc score is 3, indicating a 3.2% annual risk of stroke.   - s/p CTI ablation on 09/2024 with small mild pericardial effusion noted with plan to pursue PVI ablation in the future if not persistent on follow-up echo on 12/2024 - Today patient is in atrial flutter with controlled rate on exam today.  He has been compliant with his Xarelto  and denies any missed doses since his ablation. We discussed the blanking period and possible return of AF. -Groin sites are healed bilaterally with no complications noted. -Patient will undergo DCCV to restore sinus rhythm and will follow-up in 2 weeks postprocedure. -Patient will hold Jardiance  for 3 days prior to procedure - Continue Xarelto  20 mg daily  HTN: - Blood pressure mildly elevated with whitecoat noted and some indiscretions with salt during vacation - Advised to continue to monitor BP at home.  HFpEF: - He is mildly volume up on exam with +1 bilateral pitting edema.  He is scheduled to have a repeat TTE on 12/2024 He was encouraged to take an additional Lasix  20 mg if shortness of breath-persist or swelling increases.  Secondary Hypercoagulable State (ICD10:  D68.69) The patient is at significant risk for stroke/thromboembolism based upon his CHA2DS2-VASc Score of 3.  Continue Rivaroxaban  (Xarelto ).   Signed,  Wyn Raddle, Jackee Shove, NP    10/16/2024 8:42 AM    Informed Consent   Shared Decision Making/Informed Consent The risks (stroke, cardiac arrhythmias rarely resulting in the need for a temporary or permanent pacemaker, skin irritation or burns and complications associated with conscious sedation including aspiration, arrhythmia, respiratory failure and death), benefits (restoration of normal sinus rhythm) and alternatives of a direct current cardioversion were explained in detail to Mr. Debow and he  agrees to proceed.      Follow up with the AF Clinic in 2 weeks post cardioversion  "

## 2024-10-17 ENCOUNTER — Encounter (HOSPITAL_COMMUNITY): Payer: Self-pay | Admitting: Nurse Practitioner

## 2024-10-17 ENCOUNTER — Other Ambulatory Visit (HOSPITAL_COMMUNITY): Payer: Self-pay

## 2024-10-17 ENCOUNTER — Telehealth: Payer: Self-pay

## 2024-10-17 ENCOUNTER — Ambulatory Visit (HOSPITAL_COMMUNITY)
Admission: RE | Admit: 2024-10-17 | Discharge: 2024-10-17 | Disposition: A | Discharge status: Home / Self Care | Source: Ambulatory Visit | Attending: Nurse Practitioner | Admitting: Nurse Practitioner

## 2024-10-17 ENCOUNTER — Other Ambulatory Visit: Payer: Self-pay | Admitting: Hematology and Oncology

## 2024-10-17 VITALS — BP 152/90 | HR 60 | Ht 69.0 in | Wt 229.0 lb

## 2024-10-17 DIAGNOSIS — D6859 Other primary thrombophilia: Secondary | ICD-10-CM | POA: Diagnosis not present

## 2024-10-17 DIAGNOSIS — I5032 Chronic diastolic (congestive) heart failure: Secondary | ICD-10-CM | POA: Diagnosis present

## 2024-10-17 DIAGNOSIS — I4892 Unspecified atrial flutter: Secondary | ICD-10-CM | POA: Diagnosis not present

## 2024-10-17 DIAGNOSIS — I4819 Other persistent atrial fibrillation: Secondary | ICD-10-CM | POA: Insufficient documentation

## 2024-10-17 DIAGNOSIS — I10 Essential (primary) hypertension: Secondary | ICD-10-CM | POA: Diagnosis present

## 2024-10-17 DIAGNOSIS — I4891 Unspecified atrial fibrillation: Secondary | ICD-10-CM | POA: Insufficient documentation

## 2024-10-17 DIAGNOSIS — C9000 Multiple myeloma not having achieved remission: Secondary | ICD-10-CM

## 2024-10-17 DIAGNOSIS — I480 Paroxysmal atrial fibrillation: Secondary | ICD-10-CM | POA: Diagnosis not present

## 2024-10-17 MED ORDER — OXYCODONE HCL 5 MG PO TABS
5.0000 mg | ORAL_TABLET | ORAL | 0 refills | Status: DC | PRN
  Filled 2024-10-17: qty 90, 15d supply, fill #0

## 2024-10-17 NOTE — Patient Instructions (Addendum)
 Hold Jardiance  starting 10/21/24 Saturday may resume back after procedure   Cardioversion scheduled for: 10/25/24 Wednesday at 9:30 am    - Arrive at the [facility] ([address]  and check in with ADMITTING at 9:30 am    - Do not eat or drink anything after midnight the night prior to your procedure.   - Take all your morning medication (except diabetic medications) with a sip of water  prior to arrival.  - Do NOT miss any doses of your blood thinner - if you should miss a dose or take a dose more than 4 hours late -- please notify our office immediately.  - You will not be able to drive home after your procedure. Please ensure you have a responsible adult to drive you home. You will need someone with you for 24 hours post procedure.     - Expect to be in the procedural area approximately 2 hours.   - If you feel as if you go back into normal rhythm prior to scheduled cardioversion, please notify our office immediately.   If your procedure is canceled in the cardioversion suite you will be charged a cancellation fee.      Hold below medications 72 hours prior to scheduled procedure/anesthesia. Restart medication on the following day after scheduled procedure/anesthesia Empagliflozin  (Jardiance )     For those patients who have a scheduled procedure/anesthesia on the same day of the week as their dose, hold the medication on the day of surgery.  They can take their scheduled dose the week before.  **Patients on the above medications scheduled for elective procedures that have not held the medication for the appropriate amount of time are at risk of cancellation or change in the anesthetic plan.

## 2024-10-17 NOTE — Telephone Encounter (Signed)
He called and left a message requesting oxycodone refill to pharmacy.

## 2024-10-20 ENCOUNTER — Ambulatory Visit: Admitting: Student in an Organized Health Care Education/Training Program

## 2024-10-24 ENCOUNTER — Other Ambulatory Visit: Payer: Self-pay | Admitting: Hematology and Oncology

## 2024-10-24 NOTE — Progress Notes (Signed)
 Pt called for pre procedure instructions. Arrival time 0930 NPO after midnight explained Instructed to take am meds with sip of water and confirmed blood thinner consistency Instructed pt need for ride home tomorrow and have responsible adult with them for 24 hrs post procedure.

## 2024-10-25 ENCOUNTER — Ambulatory Visit (HOSPITAL_COMMUNITY): Admission: RE | Admit: 2024-10-25 | Discharge: 2024-10-25 | Disposition: A | Discharge status: Home / Self Care

## 2024-10-25 ENCOUNTER — Other Ambulatory Visit: Payer: Self-pay

## 2024-10-25 ENCOUNTER — Ambulatory Visit (HOSPITAL_COMMUNITY): Admitting: Anesthesiology

## 2024-10-25 ENCOUNTER — Encounter (HOSPITAL_COMMUNITY): Admission: RE | Disposition: A | Discharge status: Home / Self Care | Payer: Self-pay | Source: Home / Self Care

## 2024-10-25 DIAGNOSIS — I110 Hypertensive heart disease with heart failure: Secondary | ICD-10-CM | POA: Diagnosis not present

## 2024-10-25 DIAGNOSIS — I272 Pulmonary hypertension, unspecified: Secondary | ICD-10-CM | POA: Diagnosis not present

## 2024-10-25 DIAGNOSIS — C9000 Multiple myeloma not having achieved remission: Secondary | ICD-10-CM | POA: Insufficient documentation

## 2024-10-25 DIAGNOSIS — Z6834 Body mass index (BMI) 34.0-34.9, adult: Secondary | ICD-10-CM

## 2024-10-25 DIAGNOSIS — Z86711 Personal history of pulmonary embolism: Secondary | ICD-10-CM | POA: Diagnosis not present

## 2024-10-25 DIAGNOSIS — I441 Atrioventricular block, second degree: Secondary | ICD-10-CM | POA: Insufficient documentation

## 2024-10-25 DIAGNOSIS — I5032 Chronic diastolic (congestive) heart failure: Secondary | ICD-10-CM | POA: Insufficient documentation

## 2024-10-25 DIAGNOSIS — Z79899 Other long term (current) drug therapy: Secondary | ICD-10-CM | POA: Insufficient documentation

## 2024-10-25 DIAGNOSIS — D6869 Other thrombophilia: Secondary | ICD-10-CM | POA: Diagnosis not present

## 2024-10-25 DIAGNOSIS — E785 Hyperlipidemia, unspecified: Secondary | ICD-10-CM | POA: Insufficient documentation

## 2024-10-25 DIAGNOSIS — E669 Obesity, unspecified: Secondary | ICD-10-CM | POA: Diagnosis not present

## 2024-10-25 DIAGNOSIS — I4819 Other persistent atrial fibrillation: Secondary | ICD-10-CM | POA: Insufficient documentation

## 2024-10-25 DIAGNOSIS — Z7901 Long term (current) use of anticoagulants: Secondary | ICD-10-CM | POA: Diagnosis not present

## 2024-10-25 DIAGNOSIS — I4892 Unspecified atrial flutter: Secondary | ICD-10-CM | POA: Diagnosis not present

## 2024-10-25 HISTORY — PX: CARDIOVERSION: EP1203

## 2024-10-25 MED ORDER — LIDOCAINE 2% (20 MG/ML) 5 ML SYRINGE
INTRAMUSCULAR | Status: DC | PRN
  Administered 2024-10-25: 20 mg via INTRAVENOUS

## 2024-10-25 MED ORDER — SODIUM CHLORIDE 0.9 % IV SOLN: INTRAVENOUS | Status: DC

## 2024-10-25 MED ORDER — PROPOFOL 10 MG/ML IV BOLUS
INTRAVENOUS | Status: DC | PRN
  Administered 2024-10-25: 70 mg via INTRAVENOUS

## 2024-10-25 NOTE — Transfer of Care (Signed)
 Immediate Anesthesia Transfer of Care Note  Patient: [employer] Corp[employer]  Procedure(s) Performed: CARDIOVERSION  Patient Location: PACU and Cath Lab  Anesthesia Type:General  Level of Consciousness: awake and alert   Airway & Oxygen Therapy: Patient Spontanous Breathing and Patient connected to nasal cannula oxygen  Post-op Assessment: Report given to RN and Post -op Vital signs reviewed and stable  Post vital signs: Reviewed and stable  Last Vitals:  Vitals Value Taken Time  BP 125/67 1036  Temp    Pulse 58   Resp 24   SpO2 96     Last Pain:  Vitals:   10/25/24 0926  TempSrc: Temporal         Complications: There were no known notable events for this encounter.

## 2024-10-25 NOTE — Discharge Instructions (Signed)
 SABRA

## 2024-10-25 NOTE — Anesthesia Preprocedure Evaluation (Signed)
 "                                  Anesthesia Evaluation  Patient identified by MRN, date of birth, ID band Patient awake    Reviewed: Allergy & Precautions, NPO status , Patient's Chart, lab work & pertinent test results  Airway Mallampati: II  TM Distance: >3 FB Neck ROM: Full    Dental no notable dental hx.    Pulmonary    Pulmonary exam normal        Cardiovascular hypertension, +CHF  + dysrhythmias Atrial Fibrillation  Rhythm:Regular Rate:Normal  MPRESSIONS      1. Left ventricular ejection fraction, by estimation, is 70 to 75%. The left ventricle has hyperdynamic function. The left ventricle has no regional wall motion abnormalities. Left ventricular diastolic parameters are indeterminate.  2. Right ventricular systolic function is normal. The right ventricular size is normal. There is mildly elevated pulmonary artery systolic pressure.  3. Left atrial size was mildly dilated.  4. A small pericardial effusion is present.  5. The mitral valve is normal in structure. No evidence of mitral valve regurgitation. No evidence of mitral stenosis.  6. The aortic valve is tricuspid. Aortic valve regurgitation is not visualized. Aortic valve sclerosis is present, with no evidence of aortic valve stenosis.  7. The inferior vena cava is normal in size with greater than 50% respiratory variability, suggesting right atrial pressure of 3 mmHg.      Neuro/Psych  negative psych ROS   GI/Hepatic negative GI ROS, Neg liver ROS,,,  Endo/Other  negative endocrine ROS    Renal/GU negative Renal ROS  negative genitourinary   Musculoskeletal  (+) Arthritis , Osteoarthritis,    Abdominal Normal abdominal exam  (+)   Peds  Hematology  (+) Blood dyscrasia, anemia Lab Results      Component                Value               Date                      WBC                      4.4                 08/21/2024                HGB                      13.9                 08/21/2024                HCT                      41.3                08/21/2024                MCV                      107.0 (H)           08/21/2024                PLT  172                 08/21/2024             Lab Results      Component                Value               Date                      NA                       140                 08/21/2024                K                        3.9                 08/21/2024                CO2                      24                  08/21/2024                GLUCOSE                  116 (H)             08/21/2024                BUN                      21                  08/21/2024                CREATININE               0.82                08/21/2024                CALCIUM                   9.3                 08/21/2024                EGFR                     92                  08/17/2024                GFRNONAA                 >60                 08/21/2024              Anesthesia Other Findings   Reproductive/Obstetrics                              Anesthesia  Physical Anesthesia Plan  ASA: 3  Anesthesia Plan: General   Post-op Pain Management:    Induction: Intravenous  PONV Risk Score and Plan: 2 and Treatment may vary due to age or medical condition  Airway Management Planned: Natural Airway, Nasal Cannula and Mask  Additional Equipment: None  Intra-op Plan:   Post-operative Plan:   Informed Consent: I have reviewed the patients History and Physical, chart, labs and discussed the procedure including the risks, benefits and alternatives for the proposed anesthesia with the patient or authorized representative who has indicated his/her understanding and acceptance.     Dental advisory given  Plan Discussed with: CRNA  Anesthesia Plan Comments:          Anesthesia Quick Evaluation  "

## 2024-10-25 NOTE — Interval H&P Note (Signed)
 History and Physical Interval Note:  10/25/2024 10:11 AM  Jesse Mcclure.  has presented today for surgery, with the diagnosis of AFIB.  The various methods of treatment have been discussed with the patient and family. After consideration of risks, benefits and other options for treatment, the patient has consented to  Procedures: CARDIOVERSION (N/A) as a surgical intervention.  The patient's history has been reviewed, patient examined, no change in status, stable for surgery.  I have reviewed the patient's chart and labs.  Questions were answered to the patient's satisfaction.     [name]

## 2024-10-25 NOTE — CV Procedure (Signed)
" ° °  DIRECT CURRENT CARDIOVERSION  NAME:  Jesse Mcclure.    MRN: [id number] DOB:  Apr 08, 1951    ADMIT DATE: 10/25/2024  Indication:  Symptomatic atrial flutter  Procedure Note:  The patient signed informed consent.  They have had had therapeutic anticoagulation with greater than 3 weeks.  Anesthesia was administered by Dr. Epifanio.  Adequate airway was maintained throughout and vital followed per protocol.  They were cardioverted x 1 with 200J of biphasic synchronized energy.  They converted to NSR.  There were no apparent complications.  The patient had normal neuro status and respiratory status post procedure with vitals stable as recorded elsewhere.    Follow up: They will continue on current medical therapy and follow up with cardiology as scheduled.  Jesse DEL. Ren Ny, MD, [facility] [place]  [facility]  10:38 AM  "

## 2024-10-25 NOTE — Anesthesia Postprocedure Evaluation (Signed)
"   Anesthesia Post Note  Patient: Caymen Dubray.  Procedure(s) Performed: CARDIOVERSION     Patient location during evaluation: PACU Anesthesia Type: General Level of consciousness: awake and alert Pain management: pain level controlled Vital Signs Assessment: post-procedure vital signs reviewed and stable Respiratory status: spontaneous breathing, nonlabored ventilation, respiratory function stable and patient connected to nasal cannula oxygen Cardiovascular status: blood pressure returned to baseline and stable Postop Assessment: no apparent nausea or vomiting Anesthetic complications: no   There were no known notable events for this encounter.  Last Vitals:  Vitals:   10/25/24 1100 10/25/24 1106  BP: 126/81 135/81  Pulse: (!) 58 67  Resp: 18 19  Temp:    SpO2: 96% 97%    Last Pain:  Vitals:   10/25/24 1106  TempSrc:   PainSc: 0-No pain                 Epifanio Lamar BRAVO      "

## 2024-10-26 ENCOUNTER — Encounter (HOSPITAL_COMMUNITY): Payer: Self-pay

## 2024-11-03 ENCOUNTER — Other Ambulatory Visit (HOSPITAL_COMMUNITY): Payer: Self-pay

## 2024-11-03 ENCOUNTER — Inpatient Hospital Stay: Admitting: Hematology and Oncology

## 2024-11-03 ENCOUNTER — Encounter: Payer: Self-pay | Admitting: Hematology and Oncology

## 2024-11-03 ENCOUNTER — Inpatient Hospital Stay

## 2024-11-03 ENCOUNTER — Inpatient Hospital Stay: Attending: Hematology and Oncology

## 2024-11-03 VITALS — BP 126/68 | HR 56 | Temp 99.7°F | Resp 18 | Ht 69.0 in | Wt 223.8 lb

## 2024-11-03 VITALS — BP 125/82 | HR 61 | Temp 97.9°F | Resp 18

## 2024-11-03 DIAGNOSIS — T451X5A Adverse effect of antineoplastic and immunosuppressive drugs, initial encounter: Secondary | ICD-10-CM | POA: Diagnosis not present

## 2024-11-03 DIAGNOSIS — C9000 Multiple myeloma not having achieved remission: Secondary | ICD-10-CM | POA: Diagnosis not present

## 2024-11-03 DIAGNOSIS — D701 Agranulocytosis secondary to cancer chemotherapy: Secondary | ICD-10-CM

## 2024-11-03 LAB — COMPREHENSIVE METABOLIC PANEL WITH GFR
ALT: 24 U/L (ref 0–44)
AST: 19 U/L (ref 15–41)
Albumin: 3.4 g/dL — ABNORMAL LOW (ref 3.5–5.0)
Alkaline Phosphatase: 66 U/L (ref 38–126)
Anion gap: 9 (ref 5–15)
BUN: 20 mg/dL (ref 8–23)
CO2: 27 mmol/L (ref 22–32)
Calcium: 8.8 mg/dL — ABNORMAL LOW (ref 8.9–10.3)
Chloride: 103 mmol/L (ref 98–111)
Creatinine, Ser: 0.86 mg/dL (ref 0.61–1.24)
GFR, Estimated: >60 mL/min
Glucose, Bld: 83 mg/dL (ref 70–99)
Potassium: 4.4 mmol/L (ref 3.5–5.1)
Sodium: 139 mmol/L (ref 135–145)
Total Bilirubin: 0.9 mg/dL (ref 0.0–1.2)
Total Protein: 7 g/dL (ref 6.5–8.1)

## 2024-11-03 LAB — CBC WITH DIFFERENTIAL/PLATELET
Abs Immature Granulocytes: 0.01 10*3/uL (ref 0.00–0.07)
Basophils Absolute: 0 10*3/uL (ref 0.0–0.1)
Basophils Relative: 1 %
Eosinophils Absolute: 0.3 10*3/uL (ref 0.0–0.5)
Eosinophils Relative: 10 %
HCT: 40.5 % (ref 39.0–52.0)
Hemoglobin: 13.6 g/dL (ref 13.0–17.0)
Immature Granulocytes: 0 %
Lymphocytes Relative: 52 %
Lymphs Abs: 1.3 10*3/uL (ref 0.7–4.0)
MCH: 34.8 pg — ABNORMAL HIGH (ref 26.0–34.0)
MCHC: 33.6 g/dL (ref 30.0–36.0)
MCV: 103.6 fL — ABNORMAL HIGH (ref 80.0–100.0)
Monocytes Absolute: 0.6 10*3/uL (ref 0.1–1.0)
Monocytes Relative: 24 %
Neutro Abs: 0.3 10*3/uL — CL (ref 1.7–7.7)
Neutrophils Relative %: 13 %
Platelets: 133 10*3/uL — ABNORMAL LOW (ref 150–400)
RBC: 3.91 MIL/uL — ABNORMAL LOW (ref 4.22–5.81)
RDW: 15.5 % (ref 11.5–15.5)
WBC: 2.4 10*3/uL — ABNORMAL LOW (ref 4.0–10.5)
nRBC: 0 % (ref 0.0–0.2)

## 2024-11-03 MED ORDER — OXYCODONE HCL 5 MG PO TABS
5.0000 mg | ORAL_TABLET | ORAL | 0 refills | Status: AC | PRN
  Filled 2024-11-03: qty 90, 15d supply, fill #0

## 2024-11-03 MED ORDER — ZOLEDRONIC ACID 4 MG/100ML IV SOLN
4.0000 mg | Freq: Once | INTRAVENOUS | Status: AC
  Administered 2024-11-03: 4 mg via INTRAVENOUS
  Filled 2024-11-03: qty 100

## 2024-11-03 NOTE — Progress Notes (Signed)
 CRITICAL VALUE STICKER  CRITICAL VALUE: ANC 0.3  RECEIVER (on-site recipient of call): Erminio Louder, RN  DATE & TIME NOTIFIED: 11/03/24 AT 1041  MESSENGER (representative from lab): Jeoffrey Abbot    MD NOTIFIED: Dr. Lonn  TIME OF NOTIFICATION: 11/03/24 1048  RESPONSE:  Will see in the office now.

## 2024-11-03 NOTE — Assessment & Plan Note (Addendum)
This is likely due to recent treatment. The patient denies recent history of fevers, cough, chills, diarrhea or dysuria. He is asymptomatic from the leukopenia. I will observe for now.  I will continue the chemotherapy at current dose without dosage adjustment.  If the leukopenia gets progressive worse in the future, I might have to delay his treatment or adjust the chemotherapy dose. 

## 2024-11-03 NOTE — Assessment & Plan Note (Addendum)
 He has recurrent multiple myeloma, IgG kappa subtype, standard risk, well-maintained on current treatment with pomalidomide  and Ninlaro .   His recent treatment of combination of Pomalyst  and Ninlaro    He is tapered off dexamethasone  Recent myeloma panel show M protein is marginally elevated likely due to interruption of treatment, repeat level is pending and we will call him with test results If his M protein continue to trend upwards, I will have to repeat staging scan and switch him from to a different treatment We will continue treatment as prescribed He remains on Xarelto  for history of recurrent clots as well as DVT prophylaxis He will continue acyclovir  He is on calcium  and vitamin D  He received Zometa  in September, next dose is due today I will continue treatment without changes

## 2024-11-03 NOTE — Progress Notes (Signed)
 [facility] OFFICE PROGRESS NOTE  Patient Care Team: [name], MD as PCP - General (Family Medicine) Shlomo Wilbert SAUNDERS, MD as PCP - Cardiology (Cardiology) Almetta Donnice LABOR, MD as PCP - Electrophysiology (Cardiology) Lonn Hicks, MD as Consulting Physician (Hematology and Oncology) Lelon Glendia ONEIDA DEVONNA as Physician Assistant (Cardiology)  Assessment & Plan Multiple myeloma without remission [facility]) He has recurrent multiple myeloma, IgG kappa subtype, standard risk, well-maintained on current treatment with pomalidomide  and Ninlaro .   His recent treatment of combination of Pomalyst  and Ninlaro    He is tapered off dexamethasone  Recent myeloma panel show M protein is marginally elevated likely due to interruption of treatment, repeat level is pending and we will call him with test results If his M protein continue to trend upwards, I will have to repeat staging scan and switch him from to a different treatment We will continue treatment as prescribed He remains on Xarelto  for history of recurrent clots as well as DVT prophylaxis He will continue acyclovir  He is on calcium  and vitamin D  He received Zometa  in September, next dose is due today I will continue treatment without changes Leukopenia due to antineoplastic chemotherapy This is likely due to recent treatment. The patient denies recent history of fevers, cough, chills, diarrhea or dysuria. He is asymptomatic from the leukopenia. I will observe for now.  I will continue the chemotherapy at current dose without dosage adjustment.  If the leukopenia gets progressive worse in the future, I might have to delay his treatment or adjust the chemotherapy dose.   No orders of the defined types were placed in this encounter.    Hicks Lonn, MD  INTERVAL HISTORY: he returns for treatment follow-up for multiple myeloma Complications related to previous cycle of chemotherapy included pancytopenia, and bone aches, His  back pain is not new  PHYSICAL EXAMINATION: ECOG PERFORMANCE STATUS: 1 - Symptomatic but completely ambulatory  Lab Results  Component Value Date   IGGSERUM 1,454 10/03/2024   TOTALPROTELP 6.1 10/03/2024   MPROTEIN 1.0 (H) 10/03/2024   IFE Comment (A) 10/03/2024   KPAFRELGTCHN 120.8 (H) 10/03/2024   LAMBDASER 22.3 10/03/2024   KAPLAMBRATIO 5.42 (H) 10/03/2024   KAPLAMBRATIO 4.47 (H) 08/14/2024   KAPLAMBRATIO 3.51 (H) 07/13/2024      Latest Ref Rng & Units 11/03/2024   10:14 AM 10/03/2024   10:09 AM 08/21/2024    6:04 AM  CBC  WBC 4.0 - 10.5 K/uL 2.4  3.0  4.4   Hemoglobin 13.0 - 17.0 g/dL 86.3  86.7  86.0   Hematocrit 39.0 - 52.0 % 40.5  39.8  41.3   Platelets 150 - 400 K/uL 133  123  172       Chemistry      Component Value Date/Time   NA 139 11/03/2024 1014   NA 141 08/17/2024 1206   NA 139 04/05/2014 0849   K 4.4 11/03/2024 1014   K 4.4 04/05/2014 0849   CL 103 11/03/2024 1014   CO2 27 11/03/2024 1014   CO2 23 04/05/2014 0849   BUN 20 11/03/2024 1014   BUN 16 08/17/2024 1206   BUN 16.8 04/05/2014 0849   CREATININE 0.86 11/03/2024 1014   CREATININE 0.80 10/29/2022 1046   CREATININE 0.91 07/29/2017 1004   CREATININE 0.9 04/05/2014 0849   GLU 10 04/10/2014 0000      Component Value Date/Time   CALCIUM  8.8 (L) 11/03/2024 1014   CALCIUM  8.7 04/05/2014 0849   ALKPHOS 66 11/03/2024 1014  ALKPHOS 75 04/05/2014 0849   AST 19 11/03/2024 1014   AST 24 10/29/2022 1046   AST 26 04/05/2014 0849   ALT 24 11/03/2024 1014   ALT 31 10/29/2022 1046   ALT 30 04/05/2014 0849   BILITOT 0.9 11/03/2024 1014   BILITOT 0.9 06/01/2023 1115   BILITOT 0.9 10/29/2022 1046   BILITOT 0.71 04/05/2014 0849       Vitals:   11/03/24 1033  BP: 126/68  Pulse: (!) 56  Resp: 18  Temp: 99.7 F (37.6 C)  SpO2: 98%   Filed Weights   11/03/24 1033  Weight: 223 lb 12.8 oz (101.5 kg)   Other relevant data reviewed during this visit included CBC, CMP, recent myeloma panel

## 2024-11-06 LAB — KAPPA/LAMBDA LIGHT CHAINS
Kappa free light chain: 113.7 mg/L — ABNORMAL HIGH (ref 3.3–19.4)
Kappa, lambda light chain ratio: 5.01 — ABNORMAL HIGH (ref 0.26–1.65)
Lambda free light chains: 22.7 mg/L (ref 5.7–26.3)

## 2024-11-07 NOTE — Progress Notes (Signed)
 "  Primary Care Physician: [name], MD Primary Cardiologist: Wilbert Bihari, MD Electrophysiologist: Donnice DELENA Primus, MD  Referring Physician: Donnice DELENA Primus, MD    Elsie PARAS Edis Huish. is a 74 y.o. adult with a history of atrial flutter s/p CTI ablation by Dr. Primus 09/2024, pulmonary HTN, Mobitz 1 AVB, HFpEF, PE, HLD, HTN, hemochromatosis, multiple myeloma (without remission), who presents for follow up in the [facility].   Mr. Sookdeo was last seen on 10/17/2024 for post atrial flutter ablation follow-up and was found to be back in flutter with controlled rate of 60 bpm.  He had recently went to [facility] and did report some indiscretions with alcohol  but no overindulgence.  He had been compliant with his Xarelto  with no missed doses.  He elected to undergo DCCV to restore sinus rhythm and patient was converted to sinus rhythm on 10/25/2024.  He presents today for follow-up in the AF clinic.  Mr. Calabretta presents today for post cardioversion follow-up.  On exam today unfortunately patient has converted back to atrial flutter with controlled rate of 58 bpm. . Initially, he experienced improved breathing, increased energy, and better overall well-being post-procedure. He reports no significant swelling or discomfort, but does experience mild dyspnea during exertion. He experiences mild dyspnea during exertion, such as carrying a laundry basket, but has no significant swelling or other symptoms. He has a history of cancer and is currently on oral chemotherapy. His oncologist is considering IV chemotherapy as the current treatment is not as effective as expected. Chemotherapy can potentially affect his heart rhythm. He is not currently using any heart rate monitoring devices due to technical issues with his cardio mobile device,but he is open to using traditional methods to monitor his pulse. He travels frequently, which affects his diet and fluid intake.  We discussed  repeating DCCV and pursuing medications however patient would like to speak to Dr. Primus further regarding plan moving forward.  Today, he denies symptoms of palpitations, chest pain, shortness of breath, orthopnea, PND, lower extremity edema, dizziness, presyncope, syncope, snoring, daytime somnolence, bleeding, or neurologic sequela. The patient is tolerating medications without difficulties and is otherwise without complaint today.   Discussed the use of AI scribe software for clinical note transcription with the patient, who gave verbal consent to proceed.   Atrial Fibrillation Management history: Possible history of sleep apnea but not formally tested in the past Previous antiarrhythmic drugs: None Previous cardioversions: None Previous ablations: CTI ablation 09/12/2024 Anticoagulation history: Xarelto  pomalidomide  managed by hematology  ROS- All systems are reviewed and negative except as per the HPI above.  Past Medical History:  Diagnosis Date   Arthritis    Blood dyscrasia    hemachromatosis   Bone loss of mandible    BPH (benign prostatic hypertrophy)    CHF (congestive heart failure) (HCC)    Degenerative disc disease    Diverticulosis    Hemochromatosis    HTN (hypertension)    Hyperlipidemia    Multiple myeloma (HCC)    Neuromuscular disorder (HCC)    Occasional tremors    takes Primadone   Polio    as a child   Pulmonary embolism (HCC) 07/2021   after  Left TKA   Status post Nissen fundoplication (without gastrostomy tube) procedure    for hiatal hernia.   Past Surgical History:  Procedure Laterality Date   A-FLUTTER ABLATION N/A 09/12/2024   Procedure: A-FLUTTER ABLATION;  Surgeon: Primus Donnice DELENA, MD;  Location:  [facility];  Service: Cardiovascular;  Laterality: N/A;   CARDIOVERSION N/A 10/25/2024   Procedure: CARDIOVERSION;  Surgeon: Ren Donley Joelle VEAR, MD;  Location: [facility]VAS[facility] CV [facility];  Service: Cardiovascular;  Laterality: N/A;   EYE  SURGERY Bilateral    cataracts   FOOT SURGERY     HIATAL HERNIA REPAIR     OTHER SURGICAL HISTORY     Cataract Surgery--Both eyes   SKIN BIOPSY Right 04/13/2022   squamous cell carcinoma in stiu arising in actinic keratosis   TOTAL KNEE ARTHROPLASTY Left 07/15/2021   Procedure: TOTAL KNEE ARTHROPLASTY;  Surgeon: Josefina Chew, MD;  Location: [facility] OR[facility];  Service: Orthopedics;  Laterality: Left;   TOTAL KNEE ARTHROPLASTY Right 06/27/2024   Procedure: ARTHROPLASTY, KNEE, TOTAL;  Surgeon: Josefina Chew, MD;  Location: [facility] OR[facility];  Service: Orthopedics;  Laterality: Right;   Undescended testes Right 10/05/1966   Keflex  [cephalexin ] Current Outpatient Medications  Medication Sig Dispense Refill   acetaminophen  (TYLENOL ) 500 MG tablet Take 500-1,000 mg by mouth every 6 (six) hours as needed for mild pain (pain score 1-3) or moderate pain (pain score 4-6).     acyclovir  (ZOVIRAX ) 400 MG tablet Take 1 tablet (400 mg total) by mouth daily. 90 tablet 1   atorvastatin  (LIPITOR) 20 MG tablet Take 1 tablet (20 mg total) by mouth daily. 90 tablet 3   calcium  carbonate (TUMS - DOSED IN MG ELEMENTAL CALCIUM ) 500 MG chewable tablet Chew 1 tablet by mouth 2 (two) times daily as needed for heartburn.     Cholecalciferol  (VITAMIN D ) 50 MCG (2000 UT) tablet Take 2,000 Units by mouth daily.     empagliflozin  (JARDIANCE ) 10 MG TABS tablet Take 1 tablet (10 mg total) by mouth daily. (Patient taking differently: Take 20 mg by mouth daily. Patient reported taking 2 tablets) 90 tablet 3   folic acid  (FOLVITE ) 400 MCG tablet Take 400 mcg by mouth daily.     furosemide  (LASIX ) 20 MG tablet Take 1 tablet (20 mg total) by mouth daily. 90 tablet 1   losartan  (COZAAR ) 100 MG tablet Take 1 tablet (100 mg total) by mouth daily. 90 tablet 3   Multiple Vitamin (MULTI-VITAMIN) tablet Take 1 tablet by mouth daily.     mupirocin  ointment (BACTROBAN ) 2 % Apply 1 Application topically 2 (two) times daily. (Patient taking differently:  Apply 1 Application topically 2 (two) times daily as needed (wound care).) 22 g 2   NINLARO  3 MG capsule TAKE 1 CAPSULE BY MOUTH WEEKLY, 3 WEEKS ON, 1 WEEK OFF, REPEAT EVERY 4 WEEKS. TAKE ON AN EMPTY STOMACH 1 HOUR BEFORE OR 2 HOUR AFTER MEALS. 3 capsule 5   ondansetron  (ZOFRAN ) 4 MG tablet Take 1 tablet (4 mg total) by mouth every 8 (eight) hours as needed for nausea or vomiting. 10 tablet 0   oxyCODONE  (OXY IR/ROXICODONE ) 5 MG immediate release tablet Take 1 tablet (5 mg total) by mouth every 4 (four) hours as needed for severe pain (pain score 7-10). 90 tablet 0   Polyethyl Glycol-Propyl Glycol (SYSTANE OP) Place 1 drop into both eyes daily as needed (dry eyes).     pomalidomide  (POMALYST ) 3 MG capsule Take 1 capsule (3 mg total) by mouth See admin instructions. [employer] Auth # [id number], Date Obtained 10/24/24 Take 1 time daily for 21 days, 7 days off, repeat cycle 21 capsule 0   prochlorperazine  (COMPAZINE ) 10 MG tablet TAKE 1 TABLET(10 MG) BY MOUTH EVERY 6 HOURS AS NEEDED FOR NAUSEA OR VOMITING 30  tablet 0   rivaroxaban  (XARELTO ) 20 MG TABS tablet Take 1 tablet (20 mg total) by mouth daily with supper. 90 tablet 3   sennosides-docusate sodium  (SENOKOT-S) 8.6-50 MG tablet Take 2 tablets by mouth daily. 30 tablet 1   vitamin B-12 (CYANOCOBALAMIN ) 1000 MCG tablet Take 1,000 mcg by mouth daily.     No current facility-administered medications for this encounter.    Physical Exam: BP 124/82   Pulse (!) 58   Ht 5' 9 (1.753 m)   Wt 102.2 kg   BMI 33.29 kg/m   GEN: Well nourished, well developed in no acute distress NECK: No JVD; No carotid bruits CARDIAC: Irregularly irregular rate and rhythm, no murmurs, rubs, gallops RESPIRATORY:  Clear to auscultation without rales, wheezing or rhonchi  ABDOMEN: Soft, non-tender, non-distended EXTREMITIES:  No edema; No deformity   Wt Readings from Last 3 Encounters:  11/08/24 102.2 kg  11/03/24 101.5 kg  10/25/24 105.7 kg    Lab Results  Component  Value Date   TSH 1.780 06/27/2024   EKG today demonstrates:   EKG Interpretation Date/Time:  Wednesday November 08 2024 10:27:43 EST Ventricular Rate:  58 PR Interval:  138 QRS Duration:  80 QT Interval:  448 QTC Calculation: 439 R Axis:   59  Text Interpretation: Atrial flutter Cannot rule out Anterior infarct (cited on or before 12-Sep-2024) Abnormal ECG When compared with ECG of 25-Oct-2024 10:41, Confirmed by Wyn Manus [phone]) on 11/08/2024 10:31:43 AM        Echo Completed 09/12/2024: 1. Left ventricular ejection fraction, by estimation, is 65 to 70%. The  left ventricle has normal function. The left ventricle has no regional  wall motion abnormalities. Left ventricular diastolic parameters were  normal.   2. Right ventricular systolic function is mildly reduced. The right  ventricular size is moderately enlarged. There is moderately elevated  pulmonary artery systolic pressure.   3. Right atrial size was mildly dilated.   4. A small pericardial effusion is present. The pericardial effusion is  circumferential. There is no evidence of cardiac tamponade.   5. The mitral valve is normal in structure. No evidence of mitral valve  regurgitation. No evidence of mitral stenosis.   6. The aortic valve is normal in structure. Aortic valve regurgitation is  not visualized. No aortic stenosis is present.   7. The inferior vena cava is dilated in size with <50% respiratory  variability, suggesting right atrial pressure of 15 mmHg.   CHA2DS2-VASc Score = 3  The patient's score is based upon: CHF History: 1 HTN History: 1 Diabetes History: 0 Stroke History: 0 Vascular Disease History: 0 Age Score: 1 Gender Score: 0       ASSESSMENT AND PLAN: Paroxysmal atrial flutter: -Recurrent atrial flutter with similar morphology compared to the previous EKG - asymptomatic with mild exertional dyspnea. Cardioversion improved symptoms, but flutter persists.  -Discussed a repeat  ablation however prefers to avoid repeat ablation due to previous adverse effects. - Scheduled in-office consultation with Dr. Almetta to discuss treatment options. -    Continue Xarelto  20 mg twice daily - Monitor heart rate and symptoms, especially during exertion. - Encouraged use of pulse monitor or smart device to track heart rate. - Advised against overexertion during exercise. - Monitor for signs of fluid retention and adjust Lasix  as needed.  HTN:  -BP well controlled. Continue current antihypertensive regimen.   HFpEF: - He is mildly volume up on exam with +1 bilateral pitting edema.  He is scheduled  to have a repeat TTE on 12/2024 He was encouraged to take an additional Lasix  20 mg if shortness of breath-persist or swelling increases.   Secondary Hypercoagulable State (ICD10:  D68.69) The patient is at significant risk for stroke/thromboembolism based upon his CHA2DS2-VASc Score of 3.  Continue Rivaroxaban  (Xarelto ).   Signed,  Wyn Raddle, Jackee Shove, NP    11/08/2024 12:04 PM    Follow up with the AF Clinic as scheduled post procedure     "

## 2024-11-07 NOTE — Addendum Note (Signed)
 Encounter addended by: Wyn Jackee VEAR Mickey., NP on: 11/07/2024 11:25 AM  Actions taken: Clinical Note Signed

## 2024-11-08 ENCOUNTER — Encounter (HOSPITAL_COMMUNITY): Payer: Self-pay | Admitting: Nurse Practitioner

## 2024-11-08 ENCOUNTER — Ambulatory Visit (HOSPITAL_COMMUNITY)
Admission: RE | Admit: 2024-11-08 | Discharge: 2024-11-08 | Disposition: A | Source: Ambulatory Visit | Attending: Nurse Practitioner | Admitting: Nurse Practitioner

## 2024-11-08 VITALS — BP 124/82 | HR 58 | Ht 69.0 in | Wt 225.4 lb

## 2024-11-08 DIAGNOSIS — I480 Paroxysmal atrial fibrillation: Secondary | ICD-10-CM

## 2024-11-08 DIAGNOSIS — I5032 Chronic diastolic (congestive) heart failure: Secondary | ICD-10-CM

## 2024-11-08 DIAGNOSIS — I10 Essential (primary) hypertension: Secondary | ICD-10-CM

## 2024-11-08 DIAGNOSIS — D6859 Other primary thrombophilia: Secondary | ICD-10-CM

## 2024-11-08 LAB — MULTIPLE MYELOMA PANEL, SERUM
Albumin SerPl Elph-Mcnc: 2.7 g/dL — ABNORMAL LOW (ref 2.9–4.4)
Albumin/Glob SerPl: 0.8 (ref 0.7–1.7)
Alpha 1: 0.3 g/dL (ref 0.0–0.4)
Alpha2 Glob SerPl Elph-Mcnc: 1 g/dL (ref 0.4–1.0)
B-Globulin SerPl Elph-Mcnc: 0.9 g/dL (ref 0.7–1.3)
Gamma Glob SerPl Elph-Mcnc: 1.5 g/dL (ref 0.4–1.8)
Globulin, Total: 3.7 g/dL (ref 2.2–3.9)
IgA: 115 mg/dL (ref 61–437)
IgG (Immunoglobin G), Serum: 1712 mg/dL — ABNORMAL HIGH (ref 603–1613)
IgM (Immunoglobulin M), Srm: 67 mg/dL (ref 15–143)
M Protein SerPl Elph-Mcnc: 1.2 g/dL — ABNORMAL HIGH
Total Protein ELP: 6.4 g/dL (ref 6.0–8.5)

## 2024-11-08 NOTE — Patient Instructions (Signed)
 Will have scheduling reach out for appt with DR Almetta

## 2024-11-09 ENCOUNTER — Other Ambulatory Visit: Payer: Self-pay | Admitting: Hematology and Oncology

## 2024-11-09 ENCOUNTER — Telehealth: Payer: Self-pay

## 2024-11-09 DIAGNOSIS — C9000 Multiple myeloma not having achieved remission: Secondary | ICD-10-CM

## 2024-11-09 NOTE — Telephone Encounter (Signed)
-----   Message from Jesse Bedford, MD sent at 11/09/2024  8:33 AM EST ----- His myeloma panel is worse I need to see him sooner than next appt Tell him not to refill his prescriptions, whatever he got left finish it I will cancel his Ninlaro  and pomalyst  I can see him at 2 pm on 2/17, please schedule 30 mins

## 2024-11-09 NOTE — Telephone Encounter (Signed)
 Called and given below message. He verbalized understanding. PET scan scheduled on 2/17.  Ninlaro  and pomalyst  being delivered today and instructed to continue Rx's until seen in the office per Dr. Lonn.

## 2024-11-21 ENCOUNTER — Other Ambulatory Visit (HOSPITAL_COMMUNITY)

## 2024-11-21 ENCOUNTER — Inpatient Hospital Stay: Admitting: Hematology and Oncology

## 2024-11-21 ENCOUNTER — Inpatient Hospital Stay: Attending: Hematology and Oncology | Admitting: Hematology and Oncology

## 2024-12-01 ENCOUNTER — Inpatient Hospital Stay: Admitting: Hematology and Oncology

## 2024-12-01 ENCOUNTER — Inpatient Hospital Stay: Attending: Hematology and Oncology

## 2024-12-11 ENCOUNTER — Ambulatory Visit: Admitting: Pulmonary Disease

## 2024-12-18 ENCOUNTER — Ambulatory Visit (HOSPITAL_COMMUNITY)

## 2025-01-16 ENCOUNTER — Ambulatory Visit: Payer: Self-pay

## 2025-01-22 ENCOUNTER — Ambulatory Visit: Admitting: Physician Assistant
